# Patient Record
Sex: Female | Born: 1940 | Race: Black or African American | Hispanic: No | Smoking: Former smoker
Health system: Southern US, Community
[De-identification: ages and names within clinical notes are randomized; demographics above are authoritative.]

## PROBLEM LIST (undated history)

## (undated) DIAGNOSIS — I10 Essential (primary) hypertension: Secondary | ICD-10-CM

## (undated) DIAGNOSIS — Z9889 Other specified postprocedural states: Secondary | ICD-10-CM

## (undated) DIAGNOSIS — F419 Anxiety disorder, unspecified: Secondary | ICD-10-CM

## (undated) DIAGNOSIS — C519 Malignant neoplasm of vulva, unspecified: Secondary | ICD-10-CM

## (undated) DIAGNOSIS — IMO0002 Reserved for concepts with insufficient information to code with codable children: Secondary | ICD-10-CM

## (undated) DIAGNOSIS — Z923 Personal history of irradiation: Secondary | ICD-10-CM

## (undated) DIAGNOSIS — M1990 Unspecified osteoarthritis, unspecified site: Secondary | ICD-10-CM

## (undated) DIAGNOSIS — I272 Pulmonary hypertension, unspecified: Secondary | ICD-10-CM

## (undated) DIAGNOSIS — M545 Low back pain, unspecified: Secondary | ICD-10-CM

## (undated) DIAGNOSIS — I4891 Unspecified atrial fibrillation: Secondary | ICD-10-CM

## (undated) DIAGNOSIS — F32A Depression, unspecified: Secondary | ICD-10-CM

## (undated) DIAGNOSIS — Z8679 Personal history of other diseases of the circulatory system: Secondary | ICD-10-CM

## (undated) DIAGNOSIS — E785 Hyperlipidemia, unspecified: Secondary | ICD-10-CM

## (undated) DIAGNOSIS — I5032 Chronic diastolic (congestive) heart failure: Secondary | ICD-10-CM

## (undated) DIAGNOSIS — I340 Nonrheumatic mitral (valve) insufficiency: Secondary | ICD-10-CM

## (undated) DIAGNOSIS — I2510 Atherosclerotic heart disease of native coronary artery without angina pectoris: Secondary | ICD-10-CM

## (undated) DIAGNOSIS — M75100 Unspecified rotator cuff tear or rupture of unspecified shoulder, not specified as traumatic: Secondary | ICD-10-CM

## (undated) DIAGNOSIS — M4300 Spondylolysis, site unspecified: Secondary | ICD-10-CM

## (undated) DIAGNOSIS — I071 Rheumatic tricuspid insufficiency: Secondary | ICD-10-CM

## (undated) DIAGNOSIS — E119 Type 2 diabetes mellitus without complications: Secondary | ICD-10-CM

## (undated) DIAGNOSIS — M797 Fibromyalgia: Secondary | ICD-10-CM

## (undated) DIAGNOSIS — C50919 Malignant neoplasm of unspecified site of unspecified female breast: Secondary | ICD-10-CM

## (undated) DIAGNOSIS — Z9221 Personal history of antineoplastic chemotherapy: Secondary | ICD-10-CM

## (undated) DIAGNOSIS — I4811 Longstanding persistent atrial fibrillation: Secondary | ICD-10-CM

## (undated) DIAGNOSIS — F329 Major depressive disorder, single episode, unspecified: Secondary | ICD-10-CM

## (undated) DIAGNOSIS — I499 Cardiac arrhythmia, unspecified: Secondary | ICD-10-CM

## (undated) DIAGNOSIS — D509 Iron deficiency anemia, unspecified: Secondary | ICD-10-CM

## (undated) DIAGNOSIS — M48061 Spinal stenosis, lumbar region without neurogenic claudication: Secondary | ICD-10-CM

## (undated) HISTORY — DX: Malignant neoplasm of unspecified site of unspecified female breast: C50.919

## (undated) HISTORY — DX: Iron deficiency anemia, unspecified: D50.9

## (undated) HISTORY — DX: Chronic diastolic (congestive) heart failure: I50.32

## (undated) HISTORY — DX: Major depressive disorder, single episode, unspecified: F32.9

## (undated) HISTORY — DX: Low back pain: M54.5

## (undated) HISTORY — DX: Unspecified atrial fibrillation: I48.91

## (undated) HISTORY — DX: Reserved for concepts with insufficient information to code with codable children: IMO0002

## (undated) HISTORY — PX: TUBAL LIGATION: SHX77

## (undated) HISTORY — DX: Spinal stenosis, lumbar region without neurogenic claudication: M48.061

## (undated) HISTORY — PX: ABDOMINAL HYSTERECTOMY: SHX81

## (undated) HISTORY — DX: Fibromyalgia: M79.7

## (undated) HISTORY — PX: DILATION AND CURETTAGE OF UTERUS: SHX78

## (undated) HISTORY — DX: Low back pain, unspecified: M54.50

## (undated) HISTORY — DX: Depression, unspecified: F32.A

## (undated) HISTORY — DX: Unspecified osteoarthritis, unspecified site: M19.90

## (undated) HISTORY — PX: TONSILLECTOMY: SUR1361

## (undated) HISTORY — PX: VULVECTOMY PARTIAL: SHX6187

## (undated) HISTORY — DX: Malignant neoplasm of vulva, unspecified: C51.9

## (undated) HISTORY — PX: COLONOSCOPY: SHX174

## (undated) HISTORY — DX: Unspecified rotator cuff tear or rupture of unspecified shoulder, not specified as traumatic: M75.100

## (undated) HISTORY — DX: Spondylolysis, site unspecified: M43.00

## (undated) HISTORY — DX: Type 2 diabetes mellitus without complications: E11.9

---

## 2006-01-27 ENCOUNTER — Emergency Department (HOSPITAL_COMMUNITY): Admission: EM | Admit: 2006-01-27 | Discharge: 2006-01-28 | Payer: Self-pay | Admitting: Emergency Medicine

## 2006-05-04 ENCOUNTER — Ambulatory Visit: Payer: Self-pay | Admitting: Internal Medicine

## 2006-05-23 ENCOUNTER — Ambulatory Visit: Payer: Self-pay | Admitting: *Deleted

## 2006-06-05 ENCOUNTER — Ambulatory Visit: Payer: Self-pay | Admitting: Internal Medicine

## 2006-06-12 ENCOUNTER — Encounter: Admission: RE | Admit: 2006-06-12 | Discharge: 2006-08-17 | Payer: Self-pay | Admitting: Internal Medicine

## 2006-07-06 ENCOUNTER — Ambulatory Visit: Payer: Self-pay | Admitting: Internal Medicine

## 2006-08-01 ENCOUNTER — Ambulatory Visit (HOSPITAL_COMMUNITY): Admission: RE | Admit: 2006-08-01 | Discharge: 2006-08-01 | Payer: Self-pay | Admitting: Internal Medicine

## 2006-08-07 ENCOUNTER — Encounter: Admission: RE | Admit: 2006-08-07 | Discharge: 2006-08-07 | Payer: Self-pay | Admitting: Family Medicine

## 2006-08-30 ENCOUNTER — Encounter: Admission: RE | Admit: 2006-08-30 | Discharge: 2006-08-30 | Payer: Self-pay | Admitting: *Deleted

## 2006-11-19 ENCOUNTER — Ambulatory Visit: Payer: Self-pay | Admitting: Internal Medicine

## 2007-02-07 ENCOUNTER — Encounter: Admission: RE | Admit: 2007-02-07 | Discharge: 2007-02-07 | Payer: Self-pay | Admitting: *Deleted

## 2007-02-13 ENCOUNTER — Encounter (INDEPENDENT_AMBULATORY_CARE_PROVIDER_SITE_OTHER): Payer: Self-pay | Admitting: *Deleted

## 2007-02-27 DIAGNOSIS — F329 Major depressive disorder, single episode, unspecified: Secondary | ICD-10-CM | POA: Insufficient documentation

## 2007-02-27 DIAGNOSIS — R011 Cardiac murmur, unspecified: Secondary | ICD-10-CM | POA: Insufficient documentation

## 2007-03-20 ENCOUNTER — Ambulatory Visit: Payer: Self-pay | Admitting: Nurse Practitioner

## 2007-03-20 DIAGNOSIS — I499 Cardiac arrhythmia, unspecified: Secondary | ICD-10-CM | POA: Insufficient documentation

## 2007-03-20 DIAGNOSIS — J209 Acute bronchitis, unspecified: Secondary | ICD-10-CM | POA: Insufficient documentation

## 2007-03-20 DIAGNOSIS — I10 Essential (primary) hypertension: Secondary | ICD-10-CM | POA: Insufficient documentation

## 2007-04-10 ENCOUNTER — Encounter (INDEPENDENT_AMBULATORY_CARE_PROVIDER_SITE_OTHER): Payer: Self-pay | Admitting: Internal Medicine

## 2007-08-06 ENCOUNTER — Encounter: Admission: RE | Admit: 2007-08-06 | Discharge: 2007-08-06 | Payer: Self-pay | Admitting: Internal Medicine

## 2007-08-06 ENCOUNTER — Encounter (INDEPENDENT_AMBULATORY_CARE_PROVIDER_SITE_OTHER): Payer: Self-pay | Admitting: Internal Medicine

## 2007-08-09 ENCOUNTER — Ambulatory Visit: Payer: Self-pay | Admitting: Internal Medicine

## 2007-08-09 DIAGNOSIS — M5030 Other cervical disc degeneration, unspecified cervical region: Secondary | ICD-10-CM | POA: Insufficient documentation

## 2007-08-09 DIAGNOSIS — M5137 Other intervertebral disc degeneration, lumbosacral region: Secondary | ICD-10-CM | POA: Insufficient documentation

## 2007-08-09 DIAGNOSIS — M25519 Pain in unspecified shoulder: Secondary | ICD-10-CM | POA: Insufficient documentation

## 2007-08-12 ENCOUNTER — Ambulatory Visit (HOSPITAL_COMMUNITY): Admission: RE | Admit: 2007-08-12 | Discharge: 2007-08-12 | Payer: Self-pay | Admitting: Internal Medicine

## 2007-08-29 ENCOUNTER — Encounter (INDEPENDENT_AMBULATORY_CARE_PROVIDER_SITE_OTHER): Payer: Self-pay | Admitting: Internal Medicine

## 2007-09-10 ENCOUNTER — Ambulatory Visit: Payer: Self-pay | Admitting: Internal Medicine

## 2007-09-24 ENCOUNTER — Ambulatory Visit: Payer: Self-pay | Admitting: Internal Medicine

## 2007-09-26 ENCOUNTER — Encounter: Admission: RE | Admit: 2007-09-26 | Discharge: 2007-11-01 | Payer: Self-pay | Admitting: Internal Medicine

## 2007-09-26 ENCOUNTER — Encounter (INDEPENDENT_AMBULATORY_CARE_PROVIDER_SITE_OTHER): Payer: Self-pay | Admitting: Internal Medicine

## 2007-09-26 ENCOUNTER — Encounter (INDEPENDENT_AMBULATORY_CARE_PROVIDER_SITE_OTHER): Payer: Self-pay | Admitting: Nurse Practitioner

## 2007-10-18 ENCOUNTER — Telehealth (INDEPENDENT_AMBULATORY_CARE_PROVIDER_SITE_OTHER): Payer: Self-pay | Admitting: Internal Medicine

## 2007-11-01 ENCOUNTER — Encounter (INDEPENDENT_AMBULATORY_CARE_PROVIDER_SITE_OTHER): Payer: Self-pay | Admitting: Internal Medicine

## 2007-11-12 ENCOUNTER — Ambulatory Visit: Payer: Self-pay | Admitting: Internal Medicine

## 2007-11-12 DIAGNOSIS — R0989 Other specified symptoms and signs involving the circulatory and respiratory systems: Secondary | ICD-10-CM | POA: Insufficient documentation

## 2007-11-12 LAB — CONVERTED CEMR LAB
Chlamydia, DNA Probe: NEGATIVE
GC Probe Amp, Genital: NEGATIVE

## 2007-11-15 LAB — CONVERTED CEMR LAB
ALT: 23 units/L (ref 0–35)
AST: 18 units/L (ref 0–37)
Albumin: 4.3 g/dL (ref 3.5–5.2)
Alkaline Phosphatase: 77 units/L (ref 39–117)
BUN: 13 mg/dL (ref 6–23)
Basophils Absolute: 0 10*3/uL (ref 0.0–0.1)
Basophils Relative: 1 % (ref 0–1)
CO2: 24 meq/L (ref 19–32)
Calcium: 9.3 mg/dL (ref 8.4–10.5)
Chloride: 106 meq/L (ref 96–112)
Cholesterol: 182 mg/dL (ref 0–200)
Creatinine, Ser: 0.79 mg/dL (ref 0.40–1.20)
Eosinophils Absolute: 0.2 10*3/uL (ref 0.0–0.7)
Eosinophils Relative: 3 % (ref 0–5)
Glucose, Bld: 106 mg/dL — ABNORMAL HIGH (ref 70–99)
HCT: 39.5 % (ref 36.0–46.0)
HDL: 61 mg/dL (ref >39)
Hemoglobin: 12.5 g/dL (ref 12.0–15.0)
LDL Cholesterol: 98 mg/dL (ref 0–99)
Lymphocytes Relative: 48 % — ABNORMAL HIGH (ref 12–46)
Lymphs Abs: 2.7 10*3/uL (ref 0.7–4.0)
MCHC: 31.6 g/dL (ref 30.0–36.0)
MCV: 86.4 fL (ref 78.0–100.0)
Monocytes Absolute: 0.6 10*3/uL (ref 0.1–1.0)
Monocytes Relative: 10 % (ref 3–12)
Neutro Abs: 2.2 10*3/uL (ref 1.7–7.7)
Neutrophils Relative %: 38 % — ABNORMAL LOW (ref 43–77)
Platelets: 223 10*3/uL (ref 150–400)
Potassium: 4.9 meq/L (ref 3.5–5.3)
RBC: 4.57 M/uL (ref 3.87–5.11)
RDW: 15.8 % — ABNORMAL HIGH (ref 11.5–15.5)
Sodium: 141 meq/L (ref 135–145)
Total Bilirubin: 0.4 mg/dL (ref 0.3–1.2)
Total CHOL/HDL Ratio: 3
Total Protein: 7.5 g/dL (ref 6.0–8.3)
Triglycerides: 115 mg/dL (ref <150)
VLDL: 23 mg/dL (ref 0–40)
WBC: 5.7 10*3/uL (ref 4.0–10.5)

## 2007-11-18 ENCOUNTER — Ambulatory Visit: Payer: Self-pay | Admitting: Internal Medicine

## 2007-11-18 ENCOUNTER — Encounter (INDEPENDENT_AMBULATORY_CARE_PROVIDER_SITE_OTHER): Payer: Self-pay | Admitting: *Deleted

## 2007-11-18 LAB — CONVERTED CEMR LAB
Blood Glucose, AC Bkfst: 114 mg/dL
Hgb A1c MFr Bld: 6.5 %

## 2007-11-19 ENCOUNTER — Ambulatory Visit (HOSPITAL_COMMUNITY): Admission: RE | Admit: 2007-11-19 | Discharge: 2007-11-19 | Payer: Self-pay | Admitting: Internal Medicine

## 2007-11-19 ENCOUNTER — Encounter (INDEPENDENT_AMBULATORY_CARE_PROVIDER_SITE_OTHER): Payer: Self-pay | Admitting: Internal Medicine

## 2007-11-19 ENCOUNTER — Ambulatory Visit: Payer: Self-pay | Admitting: Surgery

## 2007-11-22 ENCOUNTER — Ambulatory Visit (HOSPITAL_COMMUNITY): Admission: RE | Admit: 2007-11-22 | Discharge: 2007-11-22 | Payer: Self-pay | Admitting: Internal Medicine

## 2007-12-19 ENCOUNTER — Ambulatory Visit: Payer: Self-pay | Admitting: Internal Medicine

## 2007-12-19 LAB — CONVERTED CEMR LAB
Bilirubin Urine: NEGATIVE
Blood Glucose, Fingerstick: 103
Blood in Urine, dipstick: NEGATIVE
Glucose, Urine, Semiquant: NEGATIVE
Ketones, urine, test strip: NEGATIVE
Nitrite: NEGATIVE
OCCULT 1: NEGATIVE
OCCULT 2: NEGATIVE
OCCULT 3: NEGATIVE
Protein, U semiquant: NEGATIVE
Specific Gravity, Urine: 1.02
Urobilinogen, UA: 0.2
pH: 5

## 2008-01-07 ENCOUNTER — Ambulatory Visit: Payer: Self-pay | Admitting: Internal Medicine

## 2008-02-05 ENCOUNTER — Telehealth (INDEPENDENT_AMBULATORY_CARE_PROVIDER_SITE_OTHER): Payer: Self-pay | Admitting: Internal Medicine

## 2008-03-11 ENCOUNTER — Ambulatory Visit: Payer: Self-pay | Admitting: Internal Medicine

## 2008-03-17 ENCOUNTER — Telehealth (INDEPENDENT_AMBULATORY_CARE_PROVIDER_SITE_OTHER): Payer: Self-pay | Admitting: Internal Medicine

## 2008-05-05 ENCOUNTER — Ambulatory Visit: Payer: Self-pay | Admitting: Internal Medicine

## 2008-05-05 DIAGNOSIS — N760 Acute vaginitis: Secondary | ICD-10-CM | POA: Insufficient documentation

## 2008-05-05 DIAGNOSIS — J309 Allergic rhinitis, unspecified: Secondary | ICD-10-CM | POA: Insufficient documentation

## 2008-05-05 LAB — CONVERTED CEMR LAB
Blood Glucose, AC Bkfst: 107 mg/dL
Hgb A1c MFr Bld: 6.2 %
KOH Prep: NONE SEEN
Whiff Test: NEGATIVE

## 2008-05-12 ENCOUNTER — Telehealth (INDEPENDENT_AMBULATORY_CARE_PROVIDER_SITE_OTHER): Payer: Self-pay | Admitting: Internal Medicine

## 2008-05-19 ENCOUNTER — Ambulatory Visit: Payer: Self-pay | Admitting: Internal Medicine

## 2008-06-01 ENCOUNTER — Telehealth (INDEPENDENT_AMBULATORY_CARE_PROVIDER_SITE_OTHER): Payer: Self-pay | Admitting: Internal Medicine

## 2008-06-02 ENCOUNTER — Ambulatory Visit: Payer: Self-pay | Admitting: Internal Medicine

## 2008-06-02 LAB — CONVERTED CEMR LAB
Chlamydia, DNA Probe: NEGATIVE
GC Probe Amp, Genital: NEGATIVE
Whiff Test: NEGATIVE

## 2008-07-16 ENCOUNTER — Encounter (INDEPENDENT_AMBULATORY_CARE_PROVIDER_SITE_OTHER): Payer: Self-pay | Admitting: Internal Medicine

## 2008-08-19 ENCOUNTER — Ambulatory Visit (HOSPITAL_COMMUNITY): Admission: RE | Admit: 2008-08-19 | Discharge: 2008-08-19 | Payer: Self-pay | Admitting: Internal Medicine

## 2008-09-01 ENCOUNTER — Ambulatory Visit: Payer: Self-pay | Admitting: Internal Medicine

## 2008-09-01 DIAGNOSIS — R0789 Other chest pain: Secondary | ICD-10-CM | POA: Insufficient documentation

## 2008-09-01 LAB — CONVERTED CEMR LAB: Hgb A1c MFr Bld: 6.5 %

## 2008-09-03 ENCOUNTER — Encounter (INDEPENDENT_AMBULATORY_CARE_PROVIDER_SITE_OTHER): Payer: Self-pay | Admitting: Internal Medicine

## 2008-09-16 ENCOUNTER — Telehealth (INDEPENDENT_AMBULATORY_CARE_PROVIDER_SITE_OTHER): Payer: Self-pay | Admitting: Internal Medicine

## 2008-10-05 ENCOUNTER — Ambulatory Visit: Payer: Self-pay | Admitting: Internal Medicine

## 2008-10-05 ENCOUNTER — Telehealth (INDEPENDENT_AMBULATORY_CARE_PROVIDER_SITE_OTHER): Payer: Self-pay | Admitting: Internal Medicine

## 2008-10-05 DIAGNOSIS — T783XXA Angioneurotic edema, initial encounter: Secondary | ICD-10-CM | POA: Insufficient documentation

## 2008-11-03 ENCOUNTER — Encounter (INDEPENDENT_AMBULATORY_CARE_PROVIDER_SITE_OTHER): Payer: Self-pay | Admitting: Internal Medicine

## 2008-11-12 ENCOUNTER — Ambulatory Visit: Payer: Self-pay | Admitting: Nurse Practitioner

## 2008-11-12 DIAGNOSIS — N644 Mastodynia: Secondary | ICD-10-CM | POA: Insufficient documentation

## 2008-11-26 ENCOUNTER — Ambulatory Visit: Payer: Self-pay | Admitting: Physician Assistant

## 2008-11-26 DIAGNOSIS — N390 Urinary tract infection, site not specified: Secondary | ICD-10-CM | POA: Insufficient documentation

## 2008-11-26 LAB — CONVERTED CEMR LAB
Glucose, Urine, Semiquant: NEGATIVE
Ketones, urine, test strip: NEGATIVE
Nitrite: NEGATIVE
Protein, U semiquant: 30
Specific Gravity, Urine: 1.025
Urobilinogen, UA: 0.2
pH: 5.5

## 2008-12-09 ENCOUNTER — Encounter (INDEPENDENT_AMBULATORY_CARE_PROVIDER_SITE_OTHER): Payer: Self-pay | Admitting: Internal Medicine

## 2008-12-09 ENCOUNTER — Ambulatory Visit (HOSPITAL_COMMUNITY): Admission: RE | Admit: 2008-12-09 | Discharge: 2008-12-09 | Payer: Self-pay | Admitting: Physician Assistant

## 2009-01-01 ENCOUNTER — Ambulatory Visit: Payer: Self-pay | Admitting: Internal Medicine

## 2009-01-01 DIAGNOSIS — E119 Type 2 diabetes mellitus without complications: Secondary | ICD-10-CM | POA: Insufficient documentation

## 2009-01-01 LAB — CONVERTED CEMR LAB
Blood Glucose, AC Bkfst: 101 mg/dL
Hgb A1c MFr Bld: 6.6 %

## 2009-01-04 ENCOUNTER — Encounter (HOSPITAL_COMMUNITY): Admission: RE | Admit: 2009-01-04 | Discharge: 2009-03-02 | Payer: Self-pay | Admitting: Cardiology

## 2009-02-02 ENCOUNTER — Ambulatory Visit: Payer: Self-pay | Admitting: Internal Medicine

## 2009-04-02 ENCOUNTER — Ambulatory Visit: Payer: Self-pay | Admitting: Internal Medicine

## 2009-04-02 DIAGNOSIS — J019 Acute sinusitis, unspecified: Secondary | ICD-10-CM | POA: Insufficient documentation

## 2009-04-02 LAB — CONVERTED CEMR LAB: Blood Glucose, Fingerstick: 180

## 2009-05-06 ENCOUNTER — Ambulatory Visit: Payer: Self-pay | Admitting: Internal Medicine

## 2009-06-23 ENCOUNTER — Ambulatory Visit: Payer: Self-pay | Admitting: Internal Medicine

## 2009-07-26 DIAGNOSIS — D649 Anemia, unspecified: Secondary | ICD-10-CM | POA: Insufficient documentation

## 2009-07-26 LAB — CONVERTED CEMR LAB
ALT: 10 units/L (ref 0–35)
AST: 12 units/L (ref 0–37)
Albumin: 4.1 g/dL (ref 3.5–5.2)
Alkaline Phosphatase: 57 units/L (ref 39–117)
BUN: 32 mg/dL — ABNORMAL HIGH (ref 6–23)
Basophils Absolute: 0.1 10*3/uL (ref 0.0–0.1)
Basophils Relative: 1 % (ref 0–1)
CO2: 24 meq/L (ref 19–32)
Calcium: 9.2 mg/dL (ref 8.4–10.5)
Chloride: 108 meq/L (ref 96–112)
Cholesterol: 183 mg/dL (ref 0–200)
Creatinine, Ser: 1.43 mg/dL — ABNORMAL HIGH (ref 0.40–1.20)
Eosinophils Absolute: 0.2 10*3/uL (ref 0.0–0.7)
Eosinophils Relative: 2 % (ref 0–5)
Glucose, Bld: 89 mg/dL (ref 70–99)
HCT: 34.4 % — ABNORMAL LOW (ref 36.0–46.0)
HDL: 45 mg/dL (ref >39)
Hemoglobin: 11 g/dL — ABNORMAL LOW (ref 12.0–15.0)
LDL Cholesterol: 105 mg/dL — ABNORMAL HIGH (ref 0–99)
Lymphocytes Relative: 45 % (ref 12–46)
Lymphs Abs: 3.5 10*3/uL (ref 0.7–4.0)
MCHC: 32 g/dL (ref 30.0–36.0)
MCV: 83.9 fL (ref 78.0–100.0)
Microalb, Ur: 0.58 mg/dL (ref 0.00–1.89)
Monocytes Absolute: 0.6 10*3/uL (ref 0.1–1.0)
Monocytes Relative: 8 % (ref 3–12)
Neutro Abs: 3.4 10*3/uL (ref 1.7–7.7)
Neutrophils Relative %: 44 % (ref 43–77)
Platelets: 236 10*3/uL (ref 150–400)
Potassium: 4.3 meq/L (ref 3.5–5.3)
RBC: 4.1 M/uL (ref 3.87–5.11)
RDW: 15 % (ref 11.5–15.5)
Sodium: 144 meq/L (ref 135–145)
Total Bilirubin: 0.3 mg/dL (ref 0.3–1.2)
Total CHOL/HDL Ratio: 4.1
Total Protein: 7.1 g/dL (ref 6.0–8.3)
Triglycerides: 167 mg/dL — ABNORMAL HIGH (ref <150)
VLDL: 33 mg/dL (ref 0–40)
WBC: 7.8 10*3/uL (ref 4.0–10.5)

## 2009-07-30 ENCOUNTER — Ambulatory Visit: Payer: Self-pay | Admitting: Internal Medicine

## 2009-07-30 DIAGNOSIS — L659 Nonscarring hair loss, unspecified: Secondary | ICD-10-CM | POA: Insufficient documentation

## 2009-07-30 DIAGNOSIS — E785 Hyperlipidemia, unspecified: Secondary | ICD-10-CM | POA: Insufficient documentation

## 2009-07-30 LAB — CONVERTED CEMR LAB
BUN: 24 mg/dL — ABNORMAL HIGH (ref 6–23)
Bilirubin Urine: NEGATIVE
Blood Glucose, Fingerstick: 100
CO2: 23 meq/L (ref 19–32)
Calcium: 9.2 mg/dL (ref 8.4–10.5)
Chloride: 105 meq/L (ref 96–112)
Creatinine, Ser: 1.23 mg/dL — ABNORMAL HIGH (ref 0.40–1.20)
Glucose, Bld: 97 mg/dL (ref 70–99)
Glucose, Urine, Semiquant: NEGATIVE
Hgb A1c MFr Bld: 6.1 %
Iron: 69 ug/dL (ref 42–145)
Ketones, urine, test strip: NEGATIVE
Nitrite: NEGATIVE
Potassium: 4.4 meq/L (ref 3.5–5.3)
Protein, U semiquant: NEGATIVE
RBC Folate: 449 ng/mL (ref 180–600)
Saturation Ratios: 18 % — ABNORMAL LOW (ref 20–55)
Sodium: 141 meq/L (ref 135–145)
Specific Gravity, Urine: <1.005
TIBC: 387 ug/dL (ref 250–470)
TSH: 2.395 microintl units/mL (ref 0.350–4.500)
UIBC: 318 ug/dL
Urobilinogen, UA: 0.2
Vitamin B-12: 455 pg/mL (ref 211–911)
pH: 5

## 2009-07-31 ENCOUNTER — Encounter (INDEPENDENT_AMBULATORY_CARE_PROVIDER_SITE_OTHER): Payer: Self-pay | Admitting: Internal Medicine

## 2009-08-10 ENCOUNTER — Ambulatory Visit: Payer: Self-pay | Admitting: Internal Medicine

## 2009-08-10 LAB — CONVERTED CEMR LAB
BUN: 23 mg/dL (ref 6–23)
CO2: 25 meq/L (ref 19–32)
Calcium: 9 mg/dL (ref 8.4–10.5)
Chloride: 105 meq/L (ref 96–112)
Creatinine, Ser: 1.1 mg/dL (ref 0.40–1.20)
Glucose, Bld: 104 mg/dL — ABNORMAL HIGH (ref 70–99)
Potassium: 3.7 meq/L (ref 3.5–5.3)
Sodium: 139 meq/L (ref 135–145)

## 2009-08-12 ENCOUNTER — Ambulatory Visit: Payer: Self-pay | Admitting: Internal Medicine

## 2009-08-13 ENCOUNTER — Encounter (INDEPENDENT_AMBULATORY_CARE_PROVIDER_SITE_OTHER): Payer: Self-pay | Admitting: Internal Medicine

## 2009-08-14 ENCOUNTER — Encounter (INDEPENDENT_AMBULATORY_CARE_PROVIDER_SITE_OTHER): Payer: Self-pay | Admitting: Internal Medicine

## 2009-08-14 ENCOUNTER — Telehealth (INDEPENDENT_AMBULATORY_CARE_PROVIDER_SITE_OTHER): Payer: Self-pay | Admitting: *Deleted

## 2009-08-14 LAB — CONVERTED CEMR LAB
Collection Interval-CRCL: 24 hr
Creatinine 24 HR UR: 1727 mg/24hr (ref 700–1800)
Creatinine Clearance: 109 mL/min (ref 75–115)
Creatinine, Urine: 69.1 mg/dL
Protein, Ur: 25 mg/24hr — ABNORMAL LOW (ref 50–100)

## 2009-08-18 ENCOUNTER — Ambulatory Visit: Payer: Self-pay | Admitting: Internal Medicine

## 2009-08-18 LAB — CONVERTED CEMR LAB
Bilirubin Urine: NEGATIVE
Glucose, Urine, Semiquant: NEGATIVE
Ketones, urine, test strip: NEGATIVE
Nitrite: NEGATIVE
Protein, U semiquant: NEGATIVE
Specific Gravity, Urine: <1.005
Urobilinogen, UA: 0.2
pH: 5

## 2009-08-20 ENCOUNTER — Ambulatory Visit (HOSPITAL_COMMUNITY): Admission: RE | Admit: 2009-08-20 | Discharge: 2009-08-20 | Payer: Self-pay | Admitting: Internal Medicine

## 2009-09-03 ENCOUNTER — Encounter (INDEPENDENT_AMBULATORY_CARE_PROVIDER_SITE_OTHER): Payer: Self-pay | Admitting: Internal Medicine

## 2009-09-05 ENCOUNTER — Encounter (INDEPENDENT_AMBULATORY_CARE_PROVIDER_SITE_OTHER): Payer: Self-pay | Admitting: Internal Medicine

## 2009-09-05 ENCOUNTER — Telehealth (INDEPENDENT_AMBULATORY_CARE_PROVIDER_SITE_OTHER): Payer: Self-pay | Admitting: Internal Medicine

## 2009-09-21 ENCOUNTER — Encounter (INDEPENDENT_AMBULATORY_CARE_PROVIDER_SITE_OTHER): Payer: Self-pay | Admitting: Internal Medicine

## 2009-09-27 ENCOUNTER — Encounter (INDEPENDENT_AMBULATORY_CARE_PROVIDER_SITE_OTHER): Payer: Self-pay | Admitting: Internal Medicine

## 2009-09-29 ENCOUNTER — Encounter (INDEPENDENT_AMBULATORY_CARE_PROVIDER_SITE_OTHER): Payer: Self-pay | Admitting: Internal Medicine

## 2009-11-01 ENCOUNTER — Ambulatory Visit: Payer: Self-pay | Admitting: Internal Medicine

## 2009-11-01 LAB — CONVERTED CEMR LAB
Cholesterol: 165 mg/dL (ref 0–200)
HDL: 47 mg/dL (ref >39)
LDL Cholesterol: 89 mg/dL (ref 0–99)
Total CHOL/HDL Ratio: 3.5
Triglycerides: 144 mg/dL (ref <150)
VLDL: 29 mg/dL (ref 0–40)

## 2009-11-04 ENCOUNTER — Ambulatory Visit: Payer: Self-pay | Admitting: Internal Medicine

## 2009-11-04 DIAGNOSIS — F17200 Nicotine dependence, unspecified, uncomplicated: Secondary | ICD-10-CM | POA: Insufficient documentation

## 2009-11-04 DIAGNOSIS — Z780 Asymptomatic menopausal state: Secondary | ICD-10-CM | POA: Insufficient documentation

## 2009-11-04 LAB — CONVERTED CEMR LAB
ALT: <8 units/L (ref 0–35)
AST: 11 units/L (ref 0–37)
Albumin: 4 g/dL (ref 3.5–5.2)
Alkaline Phosphatase: 67 units/L (ref 39–117)
BUN: 21 mg/dL (ref 6–23)
Basophils Absolute: 0.1 10*3/uL (ref 0.0–0.1)
Basophils Relative: 1 % (ref 0–1)
Bilirubin Urine: NEGATIVE
Blood Glucose, Fingerstick: 85
Blood in Urine, dipstick: NEGATIVE
CO2: 20 meq/L (ref 19–32)
Calcium: 9 mg/dL (ref 8.4–10.5)
Chloride: 104 meq/L (ref 96–112)
Creatinine, Ser: 1.16 mg/dL (ref 0.40–1.20)
Eosinophils Absolute: 0.2 10*3/uL (ref 0.0–0.7)
Eosinophils Relative: 2 % (ref 0–5)
Glucose, Bld: 96 mg/dL (ref 70–99)
Glucose, Urine, Semiquant: NEGATIVE
HCT: 34.7 % — ABNORMAL LOW (ref 36.0–46.0)
Hemoglobin: 11.2 g/dL — ABNORMAL LOW (ref 12.0–15.0)
Hgb A1c MFr Bld: 6 %
Ketones, urine, test strip: NEGATIVE
Lymphocytes Relative: 42 % (ref 12–46)
Lymphs Abs: 3.6 10*3/uL (ref 0.7–4.0)
MCHC: 32.3 g/dL (ref 30.0–36.0)
MCV: 85.9 fL (ref 78.0–100.0)
Microalb, Ur: 0.5 mg/dL (ref 0.00–1.89)
Monocytes Absolute: 0.6 10*3/uL (ref 0.1–1.0)
Monocytes Relative: 7 % (ref 3–12)
Neutro Abs: 4.1 10*3/uL (ref 1.7–7.7)
Neutrophils Relative %: 49 % (ref 43–77)
Nitrite: NEGATIVE
Platelets: 282 10*3/uL (ref 150–400)
Potassium: 3.9 meq/L (ref 3.5–5.3)
Protein, U semiquant: NEGATIVE
RBC: 4.04 M/uL (ref 3.87–5.11)
RDW: 15.5 % (ref 11.5–15.5)
Sodium: 138 meq/L (ref 135–145)
Specific Gravity, Urine: <1.005
Total Bilirubin: 0.3 mg/dL (ref 0.3–1.2)
Total Protein: 7 g/dL (ref 6.0–8.3)
Urobilinogen, UA: 0.2
WBC: 8.5 10*3/uL (ref 4.0–10.5)
pH: 5.5

## 2009-11-05 ENCOUNTER — Encounter (INDEPENDENT_AMBULATORY_CARE_PROVIDER_SITE_OTHER): Payer: Self-pay | Admitting: Internal Medicine

## 2009-11-08 ENCOUNTER — Encounter (INDEPENDENT_AMBULATORY_CARE_PROVIDER_SITE_OTHER): Payer: Self-pay | Admitting: Internal Medicine

## 2009-11-08 ENCOUNTER — Ambulatory Visit (HOSPITAL_COMMUNITY): Admission: RE | Admit: 2009-11-08 | Discharge: 2009-11-08 | Payer: Self-pay | Admitting: Internal Medicine

## 2009-11-08 LAB — CONVERTED CEMR LAB

## 2009-11-16 ENCOUNTER — Encounter (INDEPENDENT_AMBULATORY_CARE_PROVIDER_SITE_OTHER): Payer: Self-pay | Admitting: Internal Medicine

## 2009-11-17 ENCOUNTER — Encounter (INDEPENDENT_AMBULATORY_CARE_PROVIDER_SITE_OTHER): Payer: Self-pay | Admitting: Internal Medicine

## 2009-11-26 ENCOUNTER — Ambulatory Visit: Payer: Self-pay | Admitting: Internal Medicine

## 2009-12-03 ENCOUNTER — Ambulatory Visit: Payer: Self-pay | Admitting: Internal Medicine

## 2009-12-03 LAB — CONVERTED CEMR LAB: Blood Glucose, Fingerstick: 83

## 2009-12-10 ENCOUNTER — Ambulatory Visit: Payer: Self-pay | Admitting: Internal Medicine

## 2009-12-24 ENCOUNTER — Ambulatory Visit: Admission: RE | Admit: 2009-12-24 | Discharge: 2009-12-24 | Payer: Self-pay | Admitting: Gynecology

## 2010-01-07 ENCOUNTER — Inpatient Hospital Stay (HOSPITAL_BASED_OUTPATIENT_CLINIC_OR_DEPARTMENT_OTHER): Admission: RE | Admit: 2010-01-07 | Discharge: 2010-01-07 | Payer: Self-pay | Admitting: Cardiology

## 2010-01-11 ENCOUNTER — Ambulatory Visit (HOSPITAL_COMMUNITY): Admission: RE | Admit: 2010-01-11 | Discharge: 2010-01-11 | Payer: Self-pay | Admitting: Gynecology

## 2010-01-18 ENCOUNTER — Ambulatory Visit: Admission: RE | Admit: 2010-01-18 | Discharge: 2010-01-18 | Payer: Self-pay | Admitting: Gynecologic Oncology

## 2010-01-21 ENCOUNTER — Ambulatory Visit: Payer: Self-pay | Admitting: Internal Medicine

## 2010-01-27 HISTORY — PX: OTHER SURGICAL HISTORY: SHX169

## 2010-02-03 ENCOUNTER — Ambulatory Visit: Payer: Self-pay | Admitting: Internal Medicine

## 2010-02-08 ENCOUNTER — Encounter: Payer: Self-pay | Admitting: Obstetrics & Gynecology

## 2010-02-08 ENCOUNTER — Ambulatory Visit (HOSPITAL_COMMUNITY): Admission: RE | Admit: 2010-02-08 | Discharge: 2010-02-09 | Payer: Self-pay | Admitting: Obstetrics & Gynecology

## 2010-02-17 ENCOUNTER — Ambulatory Visit: Payer: Self-pay | Admitting: Internal Medicine

## 2010-03-02 ENCOUNTER — Ambulatory Visit: Admission: RE | Admit: 2010-03-02 | Discharge: 2010-03-02 | Payer: Self-pay | Admitting: Gynecologic Oncology

## 2010-03-07 ENCOUNTER — Ambulatory Visit: Payer: Self-pay | Admitting: Internal Medicine

## 2010-03-16 ENCOUNTER — Encounter (INDEPENDENT_AMBULATORY_CARE_PROVIDER_SITE_OTHER): Payer: Self-pay | Admitting: Internal Medicine

## 2010-03-21 ENCOUNTER — Ambulatory Visit: Payer: Self-pay | Admitting: Internal Medicine

## 2010-04-05 ENCOUNTER — Ambulatory Visit: Payer: Self-pay | Admitting: Internal Medicine

## 2010-04-05 LAB — CONVERTED CEMR LAB
Blood Glucose, Fingerstick: 84
Hgb A1c MFr Bld: 6 %

## 2010-04-11 ENCOUNTER — Telehealth (INDEPENDENT_AMBULATORY_CARE_PROVIDER_SITE_OTHER): Payer: Self-pay | Admitting: Internal Medicine

## 2010-04-19 ENCOUNTER — Encounter (INDEPENDENT_AMBULATORY_CARE_PROVIDER_SITE_OTHER): Payer: Self-pay | Admitting: Internal Medicine

## 2010-04-25 ENCOUNTER — Ambulatory Visit: Payer: Self-pay | Admitting: Internal Medicine

## 2010-05-26 ENCOUNTER — Ambulatory Visit
Admission: RE | Admit: 2010-05-26 | Discharge: 2010-05-26 | Payer: Self-pay | Source: Home / Self Care | Attending: Gynecologic Oncology | Admitting: Gynecologic Oncology

## 2010-06-03 ENCOUNTER — Ambulatory Visit
Admission: RE | Admit: 2010-06-03 | Discharge: 2010-06-03 | Payer: Self-pay | Source: Home / Self Care | Attending: Internal Medicine | Admitting: Internal Medicine

## 2010-06-28 NOTE — Assessment & Plan Note (Signed)
Summary: allergic reaction to shrimps//gk   Nurse Visit   Vital Signs:  Patient profile:   70 year old female Menstrual status:  hysterectomy Weight:      199 pounds BMI:     31.52 O2 Sat:      99 % Temp:     96.7 degrees F Pulse rate:   52 / minute Pulse rhythm:   regular Resp:     18 per minute BP sitting:   118 / 68  (left arm) Cuff size:   large  Vitals Entered By: Shellia Carwin CMA (Oct 05, 2008 11:55 AM)  CC: allergic reaction to some shrimp on Sat.  Has been taking Benadryl every six hours, but still has some swelling in her upper lip, no SOB.  She would like a steroid shot                                                                                                              Comments Pulse rechecked for one minute was 54.  Per Dr. Radene Ou continue Benadryl since not experiencing other symptoms......... Tiffany McCoy CMA  Oct 05, 2008 12:16 PM     Allergies: No Known Drug Allergies     Orders Added: 1)  Est. Patient Nurse visit [id number] 2)  Pulse Oximetry (single measurment) [id number]

## 2010-06-28 NOTE — Progress Notes (Signed)
   Phone Note From Pharmacy   [facility] Summary of Call: Request for refill of Toprol from [facility] pharmacy--the patient called back in September stating she was getting Medicare and wanted all her prescriptions changed to [facility] and they were.  Did she not get them filled? Initial call taken by: Mack Hook MD,  May 12, 2008 5:02 PM  Follow-up for Phone Call        Left message on answering machine for pt to return call  Follow-up by: Shellia Carwin CMA,  May 13, 2008 3:52 PM  Additional Follow-up for Phone Call Additional follow up Details #1::        Pt is using [facility] and has refills she is not sure why we got a request from [place].  Disregard it. Additional Follow-up by: Shellia Carwin CMA,  May 13, 2008 4:51 PM

## 2010-06-28 NOTE — Progress Notes (Signed)
Summary: NEEDING REFILLS   Phone Note Call from Patient Call back at Home Phone [phone]   Reason for Call: Refill Medication Summary of Call: mulberry pt. MS. Rembold WILL BE LEAVING ON NOV.[place] WILL BE BACK 12/01, BUT SHE WILL BE NEEDING A REFILL ON THE 2 PAIN MEDICATIONS BEFORE SHE LEAVES. SHE USES [facility] ON [address]. Initial call taken by: Roberto Scales,  March 17, 2008 11:24 AM  Follow-up for Phone Call        Pt requesting for Darvocet and Feldene prior to going out of town in November. Follow-up by: Shellia Carwin CMA,  March 17, 2008 12:06 PM  Additional Follow-up for Phone Call Additional follow up Details #1::        Pt aware she will check at [place].  All her rx's were transferred from [address] to [facility], so she will see if the Feldene was sent as well and let us know if any problem. Additional Follow-up by: Shellia Carwin CMA,  March 18, 2008 4:57 PM      Prescriptions: DARVOCET-N 100 100-650 MG  TABS (PROPOXYPHENE N-APAP) 1 tablet by mouth three times a day as needed for pain  #90 x 0   Entered and Authorized by:   Mack Hook MD   Signed by:   Mack Hook MD on 03/17/2008   Method used:   Printed then faxed to ...       [facility] (retail)       [address]       [place], [place]  [address]       Ph: [phone]       Fax: [phone]   RxID:   [id number]  Pt. should have refills through January on Feldene--she should check with pharmacy and let us know if that is not what they have on record.

## 2010-06-28 NOTE — Miscellaneous (Signed)
   Clinical Lists Changes  Observations: Added new observation of LDL: ; (11/08/2009 21:41) Added new observation of BONE DENSITY: AP spine:  T score 1.5;  right femoral neck T score:  -0.2;  Left femoral neck T score-0.5--normal (11/08/2009 21:41)

## 2010-06-28 NOTE — Assessment & Plan Note (Signed)
Summary: 1 MONTH FU///KT  Medications Added WELLBUTRIN XL 300 MG  TB24 (BUPROPION HCL) 1 cap by mouth daily      Allergies Added: NKDA  Vital Signs:  Patient Profile:   70 Years Old Female Height:     66.75 inches Weight:      195 pounds Temp:     97.8 degrees F oral Pulse rate:   76 / minute Pulse rhythm:   regular Resp:     20 per minute BP sitting:   150 / 76  (left arm) Cuff size:   regular  Pt. in pain?   yes    Location:   right leg    Intensity:   5    Type:       aching  Vitals Entered BySu Monks SMA (September 10, 2007 11:24 AM)              Is Patient Diabetic? No  Does patient need assistance? Functional Status Self care Ambulation Normal Comments pt has 1 refill left on Wellbutrin and Cyclobenzaprine      Chief Complaint:  pt here for CPP, pt had hysterectomy in 1988, had a ammogram in March, and pt also says she thinks she is suppose to get a steriod shot today as well.  History of Present Illness: 1.  Hypertension:  all of her antihypertensives make her sleepy.  She did not take any of her antihypertensives today.  No other blood pressure readings.  2.  Tobacco use:  stopped smoking 3/31.  Using Wellbutrin--doing fine with it.  Gaining weight.  Describes a diet high in desserts.  3.  Left shoulder:  still a problem.  Xray did not show significant narrowing.  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    Current Allergies: No known allergies       Physical Exam  Heart:     RRR.  Radial pulses normal and equal Msk:     Tender subdeltoid bursa on left.  Area cleaned in a sterile fashion.  1 cc 1% lidocaine injected Subcutaneously.  1cc lidocaine and 1cc 40 mg Depo medrol injected to subdeltoid area/shoulder joint with ease.  Pt. tolerated procedure well without complication.    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 1 tab by mouth q hs as needed pain    Darvocet-n 100 100-650 Mg Tabs (Propoxyphene n-apap) .[name] Kitchen... 1 tablet by mouth three times a day as needed for pain    Feldene 20 Mg Caps (Piroxicam) .[name] Kitchen... 1 tablet by mouth daily for neck/arm pain  Orders: Physical Therapy Referral (PT) Joint Aspirate / Injection, Large (20610) Depo- Medrol 40mg  (J1030)   Problem # 2:  DEGENERATIVE DISC DISEASE, LUMBOSACRAL SPINE (ICD-722.52) Referral to PT Orders: Physical Therapy Referral (PT)   Problem # 3:  TOBACCO ABUSE (ICD-305.1) Continue Wellbutrin  Problem # 4:  HYPERTENSION, BENIGN ESSENTIAL (ICD-401.1) To come in for nurse visit in 2 weeks for bp check--to take meds before. Her updated medication list for this problem includes:    Benazepril Hcl 40 Mg Tabs (Benazepril hcl) .[name] Kitchen... 1 tablet by mouth daily for blood pressure    Norvasc 10 Mg Tabs (Amlodipine besylate) .[name] Kitchen... 1 tablet by mouth daily for blood pressure    Toprol Xl 50 Mg Tb24 (Metoprolol succinate) .[name] Kitchen... 1 tab by mouth  daily   Complete Medication List: 1)  Cyclobenzaprine Hcl 10 Mg Tabs (Cyclobenzaprine hcl) .[name] Kitchen.. 1 tab by mouth q hs as needed pain 2)  Darvocet-n 100 100-650 Mg Tabs (Propoxyphene n-apap) .[name] Kitchen.. 1 tablet by mouth three times a day as needed for pain 3)  Feldene 20 Mg Caps (Piroxicam) .[name] Kitchen.. 1 tablet by mouth daily for neck/arm pain 4)  Benazepril Hcl 40 Mg Tabs (Benazepril hcl) .[name] Kitchen.. 1 tablet by mouth daily for blood pressure 5)  Norvasc 10 Mg Tabs (Amlodipine besylate) .[name] Kitchen.. 1 tablet by mouth daily for blood pressure 6)  Toprol Xl 50 Mg Tb24 (Metoprolol succinate) .[name] Kitchen.. 1 tab by mouth daily 7)  Wellbutrin Xl 300 Mg Tb24 (Bupropion hcl) .[name] Kitchen.. 1 cap by mouth daily   Patient Instructions: 1)  Nurse appt for bp check in next 2 weeks. 2)  Schedule CPE with Dr. Amil Amen in 2 months 3)  Call if you do not hear from PT in next week 4)  Be  seen immediately if develop redness, swelling, increased pain or fever from left shoulder.    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 Amil Amen [id number]  Orders Added: 1)  Est. Patient Level IV [id number] 2)  Physical Therapy Referral [PT] 3)  Joint Aspirate / Injection, Large [id number] 4)  Physical Therapy Referral [PT] 5)  Depo- Medrol 40mg  [id number]

## 2010-06-28 NOTE — Letter (Signed)
Summary: Handout Printed  Printed Handout:  - Diabetes: 1,500-Calorie Sample Menus and Portions

## 2010-06-28 NOTE — Letter (Signed)
Summary: FAXED REQUESTED RECORDS TO ANNA CALDWELL 08/29/07  FAXED REQUESTED RECORDS TO ANNA CALDWELL 08/29/07   Imported By: Adela Lank [place] 08/29/2007 14:49:22  _____________________________________________________________________  External Attachment:    Type:   Image     Comment:   External Document

## 2010-06-28 NOTE — Letter (Signed)
Summary: [facility]   Imported By: Roland Earl 10/19/2009 15:25:13  _____________________________________________________________________  External Attachment:    Type:   Image     Comment:   External Document

## 2010-06-28 NOTE — Letter (Signed)
Summary: APPLICATION FOR HANDICAP CARD  APPLICATION FOR HANDICAP CARD   Imported By: Roland Earl 04/10/2007 11:22:08  _____________________________________________________________________  External Attachment:    Type:   Image     Comment:   External Document

## 2010-06-28 NOTE — Progress Notes (Signed)
Summary: RAW AND VERY IRRITATED   Phone Note Call from Patient Call back at Home Phone [phone]   Summary of Call: MULBERRY PT. [place] SAYS THAT THE FLUCONAZOLE 150MG  DID NOT HELP AT ALL AND SHE IS REAL RAW. WOULD LIKE SOMETHING THAT IS GOING TO WORK, SHE IS VERY VERY IRRITATED. SHE USES [facility] ON [address]. Initial call taken by: Roberto Scales,  June 01, 2008 10:36 AM  Follow-up for Phone Call        Left message on answering machine for pt to return call at [phone] Follow-up by: Shellia Carwin CMA,  June 01, 2008 3:32 PM  Additional Follow-up for Phone Call Additional follow up Details #1::        Pt in office given open appt slot. Additional Follow-up by: Shellia Carwin CMA,  June 02, 2008 10:40 AM

## 2010-06-28 NOTE — Assessment & Plan Note (Signed)
Summary: CPE 2 MONTHS PER DR MULBERRY / NS   Vital Signs:  Patient Profile:   70 Years Old Female Height:     66.75 inches Weight:      208 pounds BMI:     32.94 Temp:     97.0 degrees F Pulse rate:   72 / minute Pulse rhythm:   regular Resp:     18 per minute BP sitting:   162 / 90  (left arm) Cuff size:   large  Pt. in pain?   yes    Location:   rt leg/back    Intensity:   5-6  Vitals Entered By: Shellia Carwin CMA (November 12, 2007 9:23 AM)                  Chief Complaint:  CPP and mammogram utd and per pt has had a total hyst..  History of Present Illness: 70 yo female here for CPP  Concerns: 1.  Weight gain:  Needs release for water exercise.  Working with Gretchen Portela through the [facility].  Cervical and Lumbar back problems. Right leg continues to bother her.  Would like to meet with nutritionist.    2.  Hypertension:  BP still up--discussed how weight may increase that as well.  Has been on Dyazide before and did well with that.  3.  External genitalia irritated.  Some odor at times.  Douches and washes regularly.  Using Premarin cream--sometimes twice a day--was prescribed last year and not even entered as a med in [place] Maintenance: 1.  Last Pap:  N/A-always normal prior to TAH 2.  SBE:  Occasionally--no change 3.  [facility]we do not [facility] report. 4.  Osteoprevention:  2-3 servings of cheese daily--no definite exercise currently--does play with grandchildren.  Does take Calcium 600 mg--no Vitamin D other than One a Day. 5.  Colonoscopy:  never 6.  Guaiac cards:  never 7.  Immunizations:  Last Td:  2008  Flu:  no  Pneumovax:  No    Current Allergies: No known allergies   Past Medical History:    Reviewed history and no changes required:       DEGENERATIVE DISC DISEASE, CERVICAL SPINE (ICD-722.4)       DEGENERATIVE DISC DISEASE, LUMBOSACRAL SPINE (ICD-722.52)       SHOULDER PAIN, LEFT, CHRONIC  (ICD-719.41)       IRREGULAR HEART RATE (ICD-427.9)       BRONCHITIS, ACUTE (ICD-466.0)       HYPERTENSION, BENIGN ESSENTIAL (ICD-401.1)       DEPRESSION (ICD-311)       MURMUR (ICD-785.2)         Past Surgical History:    Reviewed history and no changes required:       1.  1988:  TAH and BSO for fibroid tumors       2.  1974:  BTL       3.  Intermittent corticosteroid injections of low back.   Family History:    Mother, 38:  Dementia, Alzheimer's type, stroke    Father, died 37:  MI, alcoholism    4 brothers:  1 brother died age 93, MI.  Other 3 healthy    3 Sisters:  1 sister died age [id number] complications of Down Syndrome.  1 Sister with hypertension.      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    Lives alone.      Does have a female friend--sexually active occasionally.   Risk Factors:  Tobacco use:  quit    Year quit:  March of 2009--age 29    Pack-years:  Started age 34    Comments:  Smoked 1/2 ppd Passive smoke exposure:  no Drug use:  no Alcohol use:  yes    Type:  liquor--occasionally Exercise:  no Seatbelt use:  100 %   Review of Systems  General      Energy not good when hot outside.  Eyes      Glasses--needs new--wears bifocals.  ENT      Denies decreased hearing.  CV      Complains of palpitations.      Denies chest pain or discomfort.      occasional palpitations  Resp      Denies shortness of breath and wheezing.  GI      Denies abdominal pain, bloody stools, constipation, dark tarry stools, and diarrhea.  GU      See hpi  MS      Denies joint pain.  Derm      Denies lesion(s) and rash.  Neuro      Denies numbness.      Right lateral 3 toes--in PT for this currently--has had for months.  Psych      Denies depression.      Doing better now that has a place to live.   Physical  Exam  General:     obese, NAD Head:     Normocephalic and atraumatic without obvious abnormalities. No apparent alopecia or balding. Eyes:     No corneal or conjunctival inflammation noted. EOMI. Perrla. Funduscopic exam benign, without hemorrhages, exudates or papilledema. Vision grossly normal. Ears:     External ear exam shows no significant lesions or deformities.  Otoscopic examination reveals clear canals, tympanic membranes are intact bilaterally without bulging, retraction, inflammation or discharge. Hearing is grossly normal bilaterally. Nose:     External nasal examination shows no deformity or inflammation. Nasal mucosa are pink and moist without lesions or exudates. Mouth:     Palate with mucosal hypertrophy Neck:     No deformities, masses, or tenderness noted. Breasts:     No mass, nodules, thickening, tenderness, bulging, retraction, inflamation, nipple discharge or skin changes noted.   Lungs:     Normal respiratory effort, chest expands symmetrically. Lungs are clear to auscultation, no crackles or wheezes. Heart:     normal rate and regular rhythm. Grade II/VI SEM--radiates mildly to carotids.  Abdomen:     Bowel sounds positive,abdomen soft and non-tender without masses, organomegaly or hernias noted. Rectal:     No external abnormalities noted. Normal sphincter tone. No rectal masses or tenderness.  Heme negative light brown stool. Genitalia:     MIld inflammation of vaginal mucosa--not noted externally.  No discharge. Msk:     No deformity or scoliosis noted of thoracic or lumbar spine.   Pulses:     R and L carotid,radial,femoral,dorsalis pedis and posterior tibial pulses are full and equal bilaterally.  Question right carotid bruit. Extremities:     No clubbing, cyanosis, edema, or deformity noted with normal full range of motion of all joints.   Neurologic:     No cranial nerve deficits noted. Station and gait are normal. Plantar reflexes are down-going  bilaterally. DTRs are symmetrical throughout.  Unable to obtain ankle reflexes bilaterally. Sensory, motor and coordinative functions appear intact. Skin:     Intact without suspicious lesions or rashes Cervical Nodes:     No lymphadenopathy noted Axillary Nodes:     No palpable lymphadenopathy Inguinal Nodes:     No significant adenopathy Psych:     Cognition and judgment appear intact. Alert and cooperative with normal attention span and concentration. No apparent delusions, illusions, hallucinations    Impression & Recommendations:  Problem # 1:  HEALTH MAINTENANCE EXAM (ICD-V70.0) Regular exercise Calcium 600 mg two times a day with 400 International Units daily Encouraged flu shot yearly and Pneumovax in next year. Check to see if Mammogram report from 3/09 available.  Found--stable--to go yearly from now on. (calcifications) Hemoccult cards x3 to return in [redacted] weeks along with urine for UA Orders: [facility] [id number]) T-Comprehensive Metabolic Panel ([id number]) T-Lipid Profile [id number]) T-CBC w/Diff [id number])   Problem # 2:  HYPERTENSION, BENIGN ESSENTIAL (ICD-401.1) Not controlled--add Triamterene/HCTZ. Her updated medication list for this problem includes:    Benazepril Hcl 40 Mg Tabs (Benazepril hcl) .[name] Kitchen... 1 tablet by mouth daily for blood pressure    Norvasc 10 Mg Tabs (Amlodipine besylate) .[name] Kitchen... 1 tablet by mouth daily for blood pressure    Toprol Xl 50 Mg Tb24 (Metoprolol succinate) .[name] Kitchen... 1 tab by mouth daily    Triamterene-hctz 37.5-25 Mg Tabs (Triamterene-hctz) .[name] Kitchen... 1 tab by mouth q a.m.   Problem # 3:  DEGENERATIVE DISC DISEASE, LUMBOSACRAL SPINE (ICD-722.52) Radicular symptoms remain--check MRI Orders: Radiology Referral (Radiology)   Problem # 4:  CAROTID BRUIT, RIGHT (ICD-785.9) Possible--check carotid doppler Orders: Vascular Clinic (Vascular)   Complete Medication List: 1)  Cyclobenzaprine Hcl 10 Mg Tabs (Cyclobenzaprine hcl) .[name] Kitchen.. 1  tab by mouth q hs as needed pain 2)  Darvocet-n 100 100-650 Mg Tabs (Propoxyphene n-apap) .[name] Kitchen.. 1 tablet by mouth three times a day as needed for pain 3)  Feldene 20 Mg Caps (Piroxicam) .[name] Kitchen.. 1 tablet by mouth daily for neck/arm pain 4)  Benazepril Hcl 40 Mg Tabs (Benazepril hcl) .[name] Kitchen.. 1 tablet by mouth daily for blood pressure 5)  Norvasc 10 Mg Tabs (Amlodipine besylate) .[name] Kitchen.. 1 tablet by mouth daily for blood pressure 6)  Toprol Xl 50 Mg Tb24 (Metoprolol succinate) .[name] Kitchen.. 1 tab by mouth daily 7)  Wellbutrin Xl 300 Mg Tb24 (Bupropion hcl) .[name] Kitchen.. 1 cap by mouth daily 8)  Triamterene-hctz 37.5-25 Mg Tabs (Triamterene-hctz) .[name] Kitchen.. 1 tab by mouth q a.m.   Patient Instructions: 1)  Calcium 600 mg two times a day with at least 400 International Units of Vitamin D daily 2)  Regular exercise. 3)  Call for follow up with Dr. Amil Amen in 6 weeks.   Prescriptions: NORVASC 10 MG  TABS (AMLODIPINE BESYLATE) 1 tablet by mouth daily for blood pressure  #30 x 6   Entered and Authorized by:   Mack Hook MD   Signed by:   Mack Hook MD on 11/12/2007   Method used:   Print then Give to Patient   RxID:   [id number] FELDENE 20 MG  CAPS (PIROXICAM) 1 tablet by mouth daily for neck/arm pain  #30 x 6   Entered and Authorized by:   Mack Hook MD   Signed by:   Mack Hook MD on 11/12/2007   Method used:   Print then Give to Patient   RxID:   [id number] BENAZEPRIL HCL 40 MG  TABS (BENAZEPRIL HCL) 1 tablet by  mouth daily for blood pressure  #30 x 6   Entered and Authorized by:   Mack Hook MD   Signed by:   Mack Hook MD on 11/12/2007   Method used:   Print then Give to Patient   RxID:   [id number] TRIAMTERENE-HCTZ 37.5-25 MG  TABS (TRIAMTERENE-HCTZ) 1 tab by mouth q a.m.  #30 x 6   Entered and Authorized by:   Mack Hook MD   Signed by:   Mack Hook MD on 11/12/2007   Method used:   Print then Give to Patient   RxID:    [id number]  ]

## 2010-06-28 NOTE — Letter (Signed)
Summary: *Referral Letter  [facility]  [address] [place], [place] [address]   Phone: [phone]  Fax: [phone]    09/05/2009  Thank you in advance for agreeing to see my patient:  Diane [name] [address] [place], [place]  [address]  Phone: [phone]  Reason for Referral: Ms. Hasse has never had a screening colonoscopy and has mild anemia.  She has yet to return her guaiac card packet.  Iron studies are equivocal with %sat the only study a bit low.  Will include CBC and anemia studies with letter.  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.1) 10)  CHEST PAIN, ATYPICAL (ICD-786.59) 11)  ALLERGIC RHINITIS (ICD-477.9) 12)  VAGINITIS (ICD-616.10) 13)  NEED PROPHYLACTIC VACCINATION&INOCULATION FLU (ICD-V04.81) 14)  CAROTID BRUIT, RIGHT (ICD-785.9) 15)  ENCOUNTER FOR LONG-TERM USE OF OTHER MEDICATIONS (ICD-V58.69) 16)  HEALTH MAINTENANCE EXAM (ICD-V70.0) 17)  DEGENERATIVE DISC DISEASE, CERVICAL SPINE (ICD-722.4) 18)  DEGENERATIVE DISC DISEASE, LUMBOSACRAL SPINE (ICD-722.52) 19)  SHOULDER PAIN, LEFT, 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) 20)  IRREGULAR HEART RATE (ICD-427.9) 21)  BRONCHITIS, ACUTE (ICD-466.0) 22)  HYPERTENSION, BENIGN ESSENTIAL (ICD-401.1) 23)  DEPRESSION (ICD-311) 24)  MURMUR (ICD-785.2)   Current Medications: 1)  CYCLOBENZAPRINE HCL 10 MG  TABS (CYCLOBENZAPRINE HCL) 1 tab by mouth q hs as needed pain 2)  DARVOCET-N 100 100-650 MG  TABS (PROPOXYPHENE N-APAP) 1 tablet by mouth three times a day as needed for pain 3)  FELDENE 20 MG  CAPS (PIROXICAM) 1 tablet by mouth daily for neck/arm pain 4)  BENAZEPRIL HCL 40 MG   TABS (BENAZEPRIL HCL) 1 tablet by mouth daily for blood pressure 5)  NORVASC 10 MG  TABS (AMLODIPINE BESYLATE) 1 tablet by mouth daily for blood pressure 6)  TOPROL XL 50 MG  TB24 (METOPROLOL SUCCINATE) 1 tab by mouth daily 7)  WELLBUTRIN XL 300 MG  TB24 (BUPROPION HCL) 1 cap by mouth daily 8)  TRIAMTERENE-HCTZ 37.5-25 MG  TABS (TRIAMTERENE-HCTZ) 1 tab by mouth q a.m. 9)  FLUTICASONE PROPIONATE 50 MCG/ACT SUSP (FLUTICASONE PROPIONATE) 2 sprays each nostril daily 10)  FLUCONAZOLE 150 MG TABS (FLUCONAZOLE) 1 tab by mouth daily for 5 days. 11)  NITROGLYCERIN 0.4 MG SUBL (NITROGLYCERIN) dissolve one pill under tounge every 5 min as needed forl chest pain do not exceed 3 in 15 min 12)  METFORMIN HCL 500 MG TABS (METFORMIN HCL) 1 tab by mouth daily with breakfast for 1 week, then 1 tab by mouth two times a day with meals 13)  * GLUCOMETER test sugars once daily 14)  * GLUCOMETER TEST STRIPS test once daily 15)  LANCETS  MISC (LANCETS) once daily testing 16)  AZITHROMYCIN 250 MG TABS (AZITHROMYCIN) 2 tabs by mouth today, then 1 tab by mouth daily for 4 more days 17)  PROVENTIL HFA 108 (90 BASE) MCG/ACT AERS (ALBUTEROL SULFATE) 2 puffs every 4 hours as needed for chest tightness 18)  ASPIR-LOW 81 MG TBEC (ASPIRIN) 1 tab by mouth daily with meal 19)  MACROBID 100 MG CAPS (NITROFURANTOIN MONOHYD MACRO) 1 tab by mouth two times a day for 3 days 20)  FERROUS SULFATE 325 (65 FE) MG TABS (FERROUS SULFATE) 1 tab by mouth daily--otc   Past Medical History:  1)  DEGENERATIVE DISC DISEASE, CERVICAL SPINE (ICD-722.4) 2)  DEGENERATIVE DISC DISEASE, LUMBOSACRAL SPINE (ICD-722.52) 3)  SHOULDER PAIN, LEFT, 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  Sincerely,  Mack Hook MD

## 2010-06-28 NOTE — Assessment & Plan Note (Signed)
Summary: 4 MONTH FU////KT   Vital Signs:  Patient profile:   70 year old female Menstrual status:  hysterectomy Weight:      193 pounds Temp:     97.9 degrees F Pulse rate:   56 / minute Pulse rhythm:   regular Resp:     16 per minute BP sitting:   116 / 72  (left arm) Cuff size:   large  Vitals Entered By: Shellia Carwin CMA (January 01, 2009 8:29 AM) CC: 4 month f/u Is Patient Diabetic? No Pain Assessment Patient in pain? no       Does patient need assistance? Ambulation Normal   CC:  4 month f/u.  History of Present Illness: 1.  Atypical Chest Pain:  Has been to Cardiology--undergoing stress test next Monday.  Had echocardiogram last month--pt. has not received results from Cardiology as of yet.  Has NTG tabs to use as needed.  Has used once and seemed to help--chest pain occurred after eating and at rest.  Has been exercising and not having any pain during or after that.    Echo showed mild MR/AR, mild concentric hypertrophy and EF of 55-60 % without Regional wall motion abnormality.  2.  Hyperglycemia/DM:  Reportedly 101 per pt--not in chart yet.  Has been working on weight loss and exercise--aquatic therapy.  Some exercises at home.  Last A1C was 6.5%.  Today, A1C is 6.6%   3.  L/S DDD--had injection last month with Dr. Lacy Duverney office and helped significantly.   Current Medications (verified): 1)  Cyclobenzaprine Hcl 10 Mg  Tabs (Cyclobenzaprine Hcl) .[name] Kitchen.. 1 Tab By Mouth Q Hs As Needed Pain 2)  Darvocet-N 100 100-650 Mg  Tabs (Propoxyphene N-Apap) .[name] Kitchen.. 1 Tablet By Mouth Three Times A Day As Needed For Pain 3)  Feldene 20 Mg  Caps (Piroxicam) .[name] Kitchen.. 1 Tablet By Mouth Daily For Neck/arm Pain 4)  Benazepril Hcl 40 Mg  Tabs (Benazepril Hcl) .[name] Kitchen.. 1 Tablet By Mouth Daily For Blood Pressure 5)  Norvasc 10 Mg  Tabs (Amlodipine Besylate) .[name] Kitchen.. 1 Tablet By Mouth Daily For Blood Pressure 6)  Toprol Xl 50 Mg  Tb24 (Metoprolol Succinate) .[name] Kitchen.. 1 Tab By Mouth Daily 7)  Wellbutrin Xl  300 Mg  Tb24 (Bupropion Hcl) .[name] Kitchen.. 1 Cap By Mouth Daily 8)  Triamterene-Hctz 37.5-25 Mg  Tabs (Triamterene-Hctz) .[name] Kitchen.. 1 Tab By Mouth Q A.m. 9)  Fluticasone Propionate 50 Mcg/act Susp (Fluticasone Propionate) .... 2 Sprays Each Nostril Daily 10)  Fluconazole 150 Mg Tabs (Fluconazole) .[name] Kitchen.. 1 Tab By Mouth Daily For 5 Days. 11)  Nitroglycerin 0.4 Mg Subl (Nitroglycerin) .... Dissolve One Pill Under Tounge Every 5 Min As Needed Forl Chest Pain Do Not Exceed 3 in 15 Min  Allergies (verified): No Known Drug Allergies  Physical Exam  General:  NAD   Impression & Recommendations:  Problem # 1:  CHEST PAIN, ATYPICAL (ICD-786.59) Undergoing evaluation. Suspect GI in origin.  Problem # 2:  DIABETES MELLITUS, TYPE II (ICD-250.00) Start Metformin Glucometer Rx Her updated medication list for this problem includes:    Benazepril Hcl 40 Mg Tabs (Benazepril hcl) .[name] Kitchen... 1 tablet by mouth daily for blood pressure    Metformin Hcl 500 Mg Tabs (Metformin hcl) .[name] Kitchen... 1 tab by mouth daily with breakfast for 1 week, then 1 tab by mouth two times a day with meals  Complete Medication List: 1)  Cyclobenzaprine Hcl 10 Mg Tabs (Cyclobenzaprine hcl) .[name] Kitchen.. 1 tab by mouth q hs as needed pain 2)  Darvocet-n 100 100-650 Mg Tabs (Propoxyphene n-apap) .[name] Kitchen.. 1 tablet by mouth three times a day as needed for pain 3)  Feldene 20 Mg Caps (Piroxicam) .[name] Kitchen.. 1 tablet by mouth daily for neck/arm pain 4)  Benazepril Hcl 40 Mg Tabs (Benazepril hcl) .[name] Kitchen.. 1 tablet by mouth daily for blood pressure 5)  Norvasc 10 Mg Tabs (Amlodipine besylate) .[name] Kitchen.. 1 tablet by mouth daily for blood pressure 6)  Toprol Xl 50 Mg Tb24 (Metoprolol succinate) .[name] Kitchen.. 1 tab by mouth daily 7)  Wellbutrin Xl 300 Mg Tb24 (Bupropion hcl) .[name] Kitchen.. 1 cap by mouth daily 8)  Triamterene-hctz 37.5-25 Mg Tabs (Triamterene-hctz) .[name] Kitchen.. 1 tab by mouth q a.m. 9)  Fluticasone Propionate 50 Mcg/act Susp (Fluticasone propionate) .... 2 sprays each nostril daily 10)  Fluconazole  150 Mg Tabs (Fluconazole) .[name] Kitchen.. 1 tab by mouth daily for 5 days. 11)  Nitroglycerin 0.4 Mg Subl (Nitroglycerin) .... Dissolve one pill under tounge every 5 min as needed forl chest pain do not exceed 3 in 15 min 12)  Metformin Hcl 500 Mg Tabs (Metformin hcl) .[name] Kitchen.. 1 tab by mouth daily with breakfast for 1 week, then 1 tab by mouth two times a day with meals 13)  Glucometer  .... Test sugars once daily 14)  Glucometer Test Strips  .... Test once daily 15)  Lancets Misc (Lancets) .... Once daily testing  Patient Instructions: 1)  APPt with Diabetic educator for use of glucometer. 2)  Follow up with Dr. Amil Amen in 3 months Prescriptions: LANCETS  MISC (LANCETS) once daily testing  #100 x 11   Entered and Authorized by:   Mack Hook MD   Signed by:   Mack Hook MD on 01/01/2009   Method used:   Faxed to ...       [facility] [phone]* (retail)       [address]       [place], [place]  [address]       Ph: [phone]       Fax: [phone]   RxID:   [id number] GLUCOMETER TEST STRIPS test once daily  #100 x 11   Entered and Authorized by:   Mack Hook MD   Signed by:   Mack Hook MD on 01/01/2009   Method used:   Faxed to ...       [facility] [phone]* (retail)       [place], [place]  [address]       Ph: [phone]       Fax: [phone]   RxID:   [id number] GLUCOMETER test sugars once daily  #1 x 0   Entered and Authorized by:   Mack Hook MD   Signed by:   Mack Hook MD on 01/01/2009   Method used:   Faxed to ...       [facility] [phone]* (retail)       [place], [place]  [address]       Ph: [phone]       Fax: [phone]   RxID:   [id number] METFORMIN HCL 500 MG TABS (METFORMIN HCL) 1 tab by mouth daily with breakfast for 1 week, then 1 tab by mouth two times a day with meals  #60 x 4   Entered and Authorized by:   Mack Hook MD   Signed by:    Mack Hook MD on 01/01/2009   Method used:  Electronically to        [facility] [phone]* (retail)       [address]       [place], [place]  [address]       Ph: [phone]       Fax: [phone]   RxID:   [id number]

## 2010-06-28 NOTE — Assessment & Plan Note (Signed)
Summary: Bronchitis   Vital Signs:  Patient Profile:   70 Years Old Female Weight:      184 pounds Pulse rate:   76 / minute Pulse rhythm:   regular Resp:     20 per minute BP sitting:   168 / 90  (left arm) Cuff size:   regular  Pt. in pain?   yes    Location:   left shoulder, legs    Intensity:   5  Vitals Entered By: Shellia Carwin CMA (March 20, 2007 10:38 AM)              Is Patient Diabetic? No     Chief Complaint:  cold x 1 month, needs refills of bp meds although she is not sure what she is taking, and difficulty moving shoulders.  History of Present Illness: Pt into the office with multiple complaints.  Arthriits - bothered in the spine, neck and all down the left side.  she states that she was denied to get medicaid.  She has not been to orthopedic due to her denial. She states that she is filing a complaint with DDS in [place].  She has gone to rehab at [facility] for rehab on her neck but she notes that there was nothing that could be done for the pain that radiates down her left side.  pt uses a cane and is requesting a handicap placcard.  HTN - pt is out of the blood pressure meds.  She is not sure of the names of the medications.  She notes that she needs refills.  Insomnia - pt states that it is getting increasingly harder to sleep.  She notes that her neighbors have lots of visitors at all times of the night.  Pt is planning to move into the [facility].  However pt will have to break the lease in her current apartment which was originally signed for 1 year.  She will have to call legal aide for some assistance.  URI - pt has had the cold for the past month.  She has not taken anything over the counter due to her htn meds.  she has been using cough drops, lemons and orange juice.  Tobacco use - currenlty 8-10 cigs per day. pt recognizes that she needs to quit.     Hypertension History:      She denies headache, chest pain, palpitations, and side  effects from treatment.  She notes no problems with any antihypertensive medication side effects.        Positive major cardiovascular risk factors include female age 67 years old or older, hypertension, and current tobacco user.         Risk Factors:  Tobacco use:  current    Cigarettes:  Yes -- 1/2 pack(s) per day    Counseled to quit/cut down tobacco use:  yes   Review of Systems  General      Denies chills, fatigue, fever, and loss of appetite.  ENT      Denies earache and sore throat.  CV      Denies chest pain or discomfort, fatigue, and palpitations.  Resp      Complains of cough.      Denies shortness of breath and wheezing.  GI      Denies abdominal pain, bloody stools, change in bowel habits, constipation, dark tarry stools, diarrhea, excessive appetite, gas, hemorrhoids, indigestion, loss of appetite, nausea, vomiting, vomiting blood, and yellowish skin color.  GU  Denies abnormal vaginal bleeding, decreased libido, discharge, dysuria, genital sores, hematuria, incontinence, nocturia, urinary frequency, and urinary hesitancy.  MS      aches in neck and left arm  Neuro      Complains of headaches.  Psych      lots of stressors  Endo      Denies cold intolerance, excessive hunger, excessive thirst, excessive urination, heat intolerance, polyuria, and weight change.   Physical Exam  General:     alert and well-developed.   Head:     normocephalic.   Eyes:     pupils round.  glasses Ears:     R ear normal, L ear normal, and no external deformities.   Neck:     decreased ROM.  rotation is limited Lungs:     few scattered rhochi some decrease in the bases Heart:     blowing systolic murmur irregular heart rate  Abdomen:     soft, non-tender, and normal bowel sounds.   Msk:     up to exam table without assist decreased active ROM in left upper ext.   tenderness with palpation of left trapezius muscle Pulses:     pedal  edema Extremities:     trace left pedal edema.   Neurologic:     alert & oriented X3.   Skin:     color normal.   Psych:     Oriented X3.       Impression & Recommendations:  Problem # 1:  HYPERTENSION, BENIGN ESSENTIAL (ICD-401.1) BP elevated today. pt needs to get her meds refilled. Her updated medication list for this problem includes:    Toprol Xl 25 Mg Tb24 (Metoprolol succinate) .[name] Kitchen... 1/2 tablet by mouth two times a day for blood pressure    Lotrel 10-40 Mg Caps (Amlodipine besy-benazepril hcl) .[name] Kitchen... 1 tablet by mouth daily for blood pressure   Problem # 2:  DEGENERATIVE DISC DISEASE (ICD-722.6) darvocet as needed  feldent daily for inflammation pt has taken therapy and is at this point at a stand still for her treatment.  she is unable to afford the co-pay to see ortho  Problem # 3:  BRONCHITIS, ACUTE (ICD-466.0)  Her updated medication list for this problem includes:    Zithromax 250 Mg Tabs (Azithromycin) .[name] Kitchen... 2 tablets on day one then one tablet by mouth daily stop tobacco use.   Complete Medication List: 1)  Toprol Xl 25 Mg Tb24 (Metoprolol succinate) .... 1/2 tablet by mouth two times a day for blood pressure 2)  Lotrel 10-40 Mg Caps (Amlodipine besy-benazepril hcl) .[name] Kitchen.. 1 tablet by mouth daily for blood pressure 3)  Cyclobenzaprine Hcl 10 Mg Tabs (Cyclobenzaprine hcl) 4)  Darvocet-n 100 100-650 Mg Tabs (Propoxyphene n-apap) .[name] Kitchen.. 1 tablet by mouth three times a day as needed for pain 5)  Feldene 20 Mg Caps (Piroxicam) .[name] Kitchen.. 1 tablet by mouth daily for neck/arm pain 6)  Zithromax 250 Mg Tabs (Azithromycin) .... 2 tablets on day one then one tablet by mouth daily  Hypertension Assessment/Plan:      The patient's hypertensive risk group is category B: At least one risk factor (excluding diabetes) with no target organ damage.  Today's blood pressure is 168/90.  Her blood pressure goal is < 140/90.   Patient Instructions: 1)  May take plain Robitussin for  cough 2)  Start antibiotics 3)  Stop smoking    Prescriptions: ZITHROMAX 250 MG  TABS (AZITHROMYCIN) 2 tablets on day one then one tablet by mouth daily  #  6 x 0   Entered and Authorized by:   Aurora Mask FNP   Signed by:   Aurora Mask FNP on 03/20/2007   Method used:   Print then Give to Patient   RxID:   [id number] FELDENE 20 MG  CAPS (PIROXICAM) 1 tablet by mouth daily for neck/arm pain  #30 x 3   Entered and Authorized by:   Aurora Mask FNP   Signed by:   Aurora Mask FNP on 03/20/2007   Method used:   Print then Give to Patient   RxID:   [id number] LOTREL 10-40 MG  CAPS (AMLODIPINE BESY-BENAZEPRIL HCL) 1 tablet by mouth daily for blood pressure  #30 x 3   Entered and Authorized by:   Aurora Mask FNP   Signed by:   Aurora Mask FNP on 03/20/2007   Method used:   Print then Give to Patient   RxID:   [id number] TOPROL XL 25 MG  TB24 (METOPROLOL SUCCINATE) 1/2 tablet by mouth two times a day for blood pressure  #30 x 3   Entered and Authorized by:   Aurora Mask FNP   Signed by:   Aurora Mask FNP on 03/20/2007   Method used:   Print then Give to Patient   RxID:   [id number] DARVOCET-N 100 100-650 MG  TABS (PROPOXYPHENE N-APAP) 1 tablet by mouth three times a day as needed for pain  #90 x 0   Entered and Authorized by:   Aurora Mask FNP   Signed by:   Aurora Mask FNP on 03/20/2007   Method used:   Print then Give to Patient   [id number]  ]

## 2010-06-28 NOTE — Letter (Signed)
Summary: *[facility] Results Follow up  [place], [place] [address]   Phone: [phone]  Fax: [phone]      11/17/2009   [facility] [address] [place], [place]  [address]   Dear  Ms. [place],                            ____S.Drinkard,FNP   ____D. Gore,FNP       ____B. McPherson,MD   ____V. Rankins,MD    ___X_E. Mulberry,MD    ____N. Hassell Done, FNP  ____D. Jobe Igo, MD    ____K. Tomma Lightning, MD    ____Other     This letter is to inform you that your recent test(s):  _______Pap Smear    _______Lab Test     ___X____X-ray    __X_____ is within acceptable limits  _______ requires a medication change  _______ requires a follow-up lab visit  _______ requires a follow-up visit with your provider   Comments:  Bone density was normal       _________________________________________________________ If you have any questions, please contact our office                     Sincerely,  Mack Hook MD [facility]

## 2010-06-28 NOTE — Letter (Signed)
Summary: *Referral Letter  [facility]  [address] [place], [place] [address]   Phone: [phone]  Fax: [phone]    08/14/2009  Thank you in advance for agreeing to see my patient:  Diane [name] [address] [place], [place]  [address]  Phone: [phone]  Reason for Referral: Vaginal irritation sometimes associated with discharge and odor dating back to 2009.  Minimal findings on exam initially.  On wet prep, has had BV and yeast intermittently.  Treated in past with Metronidazole, Fluconazole, and antifungal topicals as well.  Seemed to respond to treatment at times, but with recurrence.  On exam beginning of March, Pt. complained of more discomfort with new finding of significant inflammation and ulceration at left vaginal introitus with couple of white raised "warty" lesions within the inflammed area.  Concern for Leukoplakia/malignancy or possibly lichen planus.    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) 10)  CHEST PAIN, ATYPICAL (ICD-786.59) 11)  ALLERGIC RHINITIS (ICD-477.9) 12)  VAGINITIS (ICD-616.10) 13)  NEED PROPHYLACTIC VACCINATION&INOCULATION FLU (ICD-V04.81) 14)  CAROTID BRUIT, RIGHT (ICD-785.9) 15)  ENCOUNTER FOR LONG-TERM USE OF OTHER MEDICATIONS (ICD-V58.69) 16)  HEALTH MAINTENANCE EXAM (ICD-V70.0) 17)  DEGENERATIVE DISC DISEASE, CERVICAL SPINE (ICD-722.4) 18)  DEGENERATIVE DISC DISEASE, LUMBOSACRAL SPINE (ICD-722.52) 19)  SHOULDER PAIN, LEFT, 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) 20)  IRREGULAR HEART RATE (ICD-427.9) 21)  BRONCHITIS, ACUTE (ICD-466.0) 22)  HYPERTENSION, BENIGN  ESSENTIAL (ICD-401.1) 23)  DEPRESSION (ICD-311) 24)  MURMUR (ICD-785.2)   Current Medications: 1)  CYCLOBENZAPRINE HCL 10 MG  TABS (CYCLOBENZAPRINE HCL) 1 tab by mouth q hs as needed pain 2)  DARVOCET-N 100 100-650 MG  TABS (PROPOXYPHENE N-APAP) 1 tablet by mouth three times a day as needed for pain 3)  FELDENE 20 MG  CAPS (PIROXICAM) 1 tablet by mouth daily for neck/arm pain 4)  BENAZEPRIL HCL 40 MG  TABS (BENAZEPRIL HCL) 1 tablet by mouth daily for blood pressure 5)  NORVASC 10 MG  TABS (AMLODIPINE BESYLATE) 1 tablet by mouth daily for blood pressure 6)  TOPROL XL 50 MG  TB24 (METOPROLOL SUCCINATE) 1 tab by mouth daily 7)  WELLBUTRIN XL 300 MG  TB24 (BUPROPION HCL) 1 cap by mouth daily 8)  TRIAMTERENE-HCTZ 37.5-25 MG  TABS (TRIAMTERENE-HCTZ) 1 tab by mouth q a.m. 9)  FLUTICASONE PROPIONATE 50 MCG/ACT SUSP (FLUTICASONE PROPIONATE) 2 sprays each nostril daily 10)  FLUCONAZOLE 150 MG TABS (FLUCONAZOLE) 1 tab by mouth daily for 5 days. 11)  NITROGLYCERIN 0.4 MG SUBL (NITROGLYCERIN) dissolve one pill under tounge every 5 min as needed forl chest pain do not exceed 3 in 15 min 12)  METFORMIN HCL 500 MG TABS (METFORMIN HCL) 1 tab by mouth daily with breakfast for 1 week, then 1 tab by mouth two times a day with meals 13)  * GLUCOMETER test sugars once daily 14)  * GLUCOMETER TEST STRIPS test once daily 15)  LANCETS  MISC (LANCETS) once daily testing 16)  AZITHROMYCIN 250 MG TABS (AZITHROMYCIN) 2 tabs by mouth today, then 1 tab by mouth daily for 4 more days 17)  PROVENTIL HFA 108 (90 BASE) MCG/ACT AERS (ALBUTEROL SULFATE) 2 puffs every 4 hours as needed for chest tightness 18)  ASPIR-LOW 81 MG TBEC (ASPIRIN) 1 tab by mouth daily with meal 19)  MACROBID 100 MG CAPS (NITROFURANTOIN MONOHYD MACRO) 1 tab by mouth two times a day for 3 days   Past Medical History: 1)  DEGENERATIVE DISC DISEASE, CERVICAL SPINE (ICD-722.4) 2)  DEGENERATIVE DISC DISEASE, LUMBOSACRAL SPINE (ICD-722.52) 3)  SHOULDER  PAIN, LEFT, 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) 4)  IRREGULAR HEART RATE (ICD-427.9) 5)  BRONCHITIS, ACUTE (ICD-466.0) 6)  HYPERTENSION, BENIGN ESSENTIAL (ICD-401.1) 7)  DEPRESSION (ICD-311) 8)  MURMUR (ICD-785.2) 9)  Borderline Diabetes   Prior History of Blood Transfusions:   Pertinent Labs:    Thank you again for agreeing to see our patient; please contact us if you have any further questions or need additional information.  Sincerely,  Mack Hook MD

## 2010-06-28 NOTE — Assessment & Plan Note (Signed)
Summary: 4 MONTH FU///KT   Vital Signs:  Patient profile:   70 year old female Menstrual status:  hysterectomy Height:      66.75 inches Weight:      181 pounds BMI:     28.66 Temp:     97.6 degrees F oral Pulse rate:   54 / minute Pulse rhythm:   regular Resp:     18 per minute BP sitting:   141 / 73  (left arm) Cuff size:   large  Vitals Entered By: [place] Shannon (July 30, 2009 8:44 AM) CC: four month f/u.... pt has questions about her sugar levels and she says she still has the irration in her vagina.... Is Patient Diabetic? Yes CBG Result 100  Does patient need assistance? Functional Status Self care Ambulation Normal   CC:  four month f/u.... pt has questions about her sugar levels and she says she still has the irration in her vagina.....  History of Present Illness: 1.  Hair loss:  all over, but more so behind bang line--started after last seen.  Only started Metformin in recent months--August.  2.  DM:  Working on diet--has lost 9 lbs since last here.  Trying to eat more veggies.  Sugars running 115- 130  fasting, bedtime and about 1 hour postprandial.  A1C down to 6.1% currently.  Had Retasure in December.  3.  Hypertension:  did not take meds this morning.  4.  Elevated creatinine:  Was to come in nonfasting for labs, but was not notified to do so--had Tea only today.  5.  Hyperlipidemia:  Recently had done with Dr. Terrence Dupont as well.  Did not start on any meds.  Pt. has changed diet since last seen.  Planning to work out at [employer] as well.  Would like to continue working on life style changes before starting meds.  6.  Anemia:  No hematochezia or melena.  Occasional epigastric cramping after eating--lasts only a couple of minutes.  No history of hemorrhoids.  7.  Vaginal irritation:  Recurrent issue--on left--thought this was better with last visit..  Have not really seen anything on exam previously save for a bit of irritation--generally more internally and with a  discharge/atrophy.  Have tried multiple topicals, oral Fluconazole in past with what I thought was resolution--has shown clue cells and yeast in past Wet preps .  Seems to improve on own intermittently as well.Bonne Dolores Vagisil?  cannot remember exactly what she used--anti itch cream--did not help.  Just feels raw.  Using Vaseline.  Current Medications (verified): 1)  Cyclobenzaprine Hcl 10 Mg  Tabs (Cyclobenzaprine Hcl) .[name] Kitchen.. 1 Tab By Mouth Q Hs As Needed Pain 2)  Darvocet-N 100 100-650 Mg  Tabs (Propoxyphene N-Apap) .[name] Kitchen.. 1 Tablet By Mouth Three Times A Day As Needed For Pain 3)  Feldene 20 Mg  Caps (Piroxicam) .[name] Kitchen.. 1 Tablet By Mouth Daily For Neck/arm Pain 4)  Benazepril Hcl 40 Mg  Tabs (Benazepril Hcl) .[name] Kitchen.. 1 Tablet By Mouth Daily For Blood Pressure 5)  Norvasc 10 Mg  Tabs (Amlodipine Besylate) .[name] Kitchen.. 1 Tablet By Mouth Daily For Blood Pressure 6)  Toprol Xl 50 Mg  Tb24 (Metoprolol Succinate) .[name] Kitchen.. 1 Tab By Mouth Daily 7)  Wellbutrin Xl 300 Mg  Tb24 (Bupropion Hcl) .[name] Kitchen.. 1 Cap By Mouth Daily 8)  Triamterene-Hctz 37.5-25 Mg  Tabs (Triamterene-Hctz) .[name] Kitchen.. 1 Tab By Mouth Q A.m. 9)  Fluticasone Propionate 50 Mcg/act Susp (Fluticasone Propionate) .... 2 Sprays Each Nostril Daily 10)  Fluconazole 150 Mg Tabs (Fluconazole) .[name] Kitchen.. 1 Tab By Mouth Daily For 5 Days. 11)  Nitroglycerin 0.4 Mg Subl (Nitroglycerin) .... Dissolve One Pill Under Tounge Every 5 Min As Needed Forl Chest Pain Do Not Exceed 3 in 15 Min 12)  Metformin Hcl 500 Mg Tabs (Metformin Hcl) .[name] Kitchen.. 1 Tab By Mouth Daily With Breakfast For 1 Week, Then 1 Tab By Mouth Two Times A Day With Meals 13)  Glucometer .... Test Sugars Once Daily 14)  Glucometer Test Strips .... Test Once Daily 15)  Lancets  Misc (Lancets) .... Once Daily Testing 16)  Azithromycin 250 Mg Tabs (Azithromycin) .... 2 Tabs By Mouth Today, Then 1 Tab By Mouth Daily For 4 More Days 17)  Proventil Hfa 108 (90 Base) Mcg/act Aers (Albuterol Sulfate) .... 2 Puffs Every 4 Hours As Needed For  Chest Tightness  Allergies (verified): No Known Drug Allergies  Physical Exam  Head:  Unable to remove any hair with gentle pull.  No patches of hair loss noted Lungs:  Normal respiratory effort, chest expands symmetrically. Lungs are clear to auscultation, no crackles or wheezes. Heart:  Normal rate and regular rhythm. S1 and S2 normal without gallop, murmur, click, rub or other extra sounds. Abdomen:  Bowel sounds positive,abdomen soft and non-tender without masses, organomegaly or hernias noted. Genitalia:  Obvious inflammation, ulceration today at left vaginal vestibule, inner aspect of labia minora.  Has two raised white almost warty appearing lesion within the inflammed area.  Tender in this area.  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 Orders: Gynecologic Referral (Gyn)  Problem # 2:  DYSLIPIDEMIA (ICD-272.4) Pt. would like to work on lifestyle changes before starting meds. LDL goal less than 70  Problem # 3:  RENAL INSUFFICIENCY (ICD-588.9) Recheck labs Orders: T-Basic Metabolic Panel ([id number])  Problem # 4:  HAIR LOSS (ICD-704.00) Appears if anything to be a global thinning of hair Orders: T-TSH [id number])  Problem # 5:  ANEMIA (0000000)  Orders: T-Folic Acid; RBC ([id number]) T-Vitamin B12 [id number]) Alric Quan [id number]) T-Iron Binding Capacity (TIBC) ([id number])  Problem # 6:  DIABETES MELLITUS, TYPE II (ICD-250.00) controlled Her updated medication list for this problem includes:    Benazepril Hcl 40 Mg Tabs (Benazepril hcl) .[name] Kitchen... 1 tablet by mouth daily for blood pressure    Metformin Hcl 500 Mg Tabs (Metformin hcl) .[name] Kitchen... 1 tab by mouth daily with breakfast for 1 week, then 1 tab by mouth two times a day with meals    Aspir-low 81 Mg Tbec (Aspirin)  .[name] Kitchen... 1 tab by mouth daily with meal  Orders: Hgb A1C [id number]) Capillary Blood Glucose/CBG [id number])  Problem # 7:  HYPERTENSION, BENIGN ESSENTIAL (ICD-401.1) A bit high today, but did not take meds. Her updated medication list for this problem includes:    Benazepril Hcl 40 Mg Tabs (Benazepril hcl) .[name] Kitchen... 1 tablet by mouth daily for blood pressure    Norvasc 10 Mg Tabs (Amlodipine besylate) .[name] Kitchen... 1 tablet by mouth daily for blood pressure    Toprol Xl 50 Mg Tb24 (Metoprolol succinate) .[name] Kitchen... 1 tab by mouth daily    Triamterene-hctz 37.5-25 Mg Tabs (Triamterene-hctz) .[name] Kitchen... 1 tab by mouth q a.m.  Complete Medication List: 1)  Cyclobenzaprine Hcl 10 Mg Tabs (Cyclobenzaprine hcl) .[name] Kitchen.. 1 tab by mouth q hs as needed pain 2)  Darvocet-n  100 100-650 Mg Tabs (Propoxyphene n-apap) .[name] Kitchen.. 1 tablet by mouth three times a day as needed for pain 3)  Feldene 20 Mg Caps (Piroxicam) .[name] Kitchen.. 1 tablet by mouth daily for neck/arm pain 4)  Benazepril Hcl 40 Mg Tabs (Benazepril hcl) .[name] Kitchen.. 1 tablet by mouth daily for blood pressure 5)  Norvasc 10 Mg Tabs (Amlodipine besylate) .[name] Kitchen.. 1 tablet by mouth daily for blood pressure 6)  Toprol Xl 50 Mg Tb24 (Metoprolol succinate) .[name] Kitchen.. 1 tab by mouth daily 7)  Wellbutrin Xl 300 Mg Tb24 (Bupropion hcl) .[name] Kitchen.. 1 cap by mouth daily 8)  Triamterene-hctz 37.5-25 Mg Tabs (Triamterene-hctz) .[name] Kitchen.. 1 tab by mouth q a.m. 9)  Fluticasone Propionate 50 Mcg/act Susp (Fluticasone propionate) .... 2 sprays each nostril daily 10)  Fluconazole 150 Mg Tabs (Fluconazole) .[name] Kitchen.. 1 tab by mouth daily for 5 days. 11)  Nitroglycerin 0.4 Mg Subl (Nitroglycerin) .... Dissolve one pill under tounge every 5 min as needed forl chest pain do not exceed 3 in 15 min 12)  Metformin Hcl 500 Mg Tabs (Metformin hcl) .[name] Kitchen.. 1 tab by mouth daily with breakfast for 1 week, then 1 tab by mouth two times a day with meals 13)  Glucometer  .... Test sugars once daily 14)  Glucometer Test Strips  .... Test once daily 15)   Lancets Misc (Lancets) .... Once daily testing 16)  Azithromycin 250 Mg Tabs (Azithromycin) .... 2 tabs by mouth today, then 1 tab by mouth daily for 4 more days 17)  Proventil Hfa 108 (90 Base) Mcg/act Aers (Albuterol sulfate) .... 2 puffs every 4 hours as needed for chest tightness 18)  Aspir-low 81 Mg Tbec (Aspirin) .[name] Kitchen.. 1 tab by mouth daily with meal 19)  Macrobid 100 Mg Caps (Nitrofurantoin monohyd macro) .[name] Kitchen.. 1 tab by mouth two times a day for 3 days  Patient Instructions: 1)  Lab appt. for FLP --fasting in 3-4 months 2)  CPP 2 days after  above labs drawn--Dr. Amil Amen  Laboratory Results   Urine Tests    Routine Urinalysis   Glucose: negative   (Normal Range: Negative) Bilirubin: negative   (Normal Range: Negative) Ketone: negative   (Normal Range: Negative) Spec. Gravity: <1.005   (Normal Range: 1.003-1.035) Blood: small   (Normal Range: Negative) pH: 5.0   (Normal Range: 5.0-8.0) Protein: negative   (Normal Range: Negative) Urobilinogen: 0.2   (Normal Range: 0-1) Nitrite: negative   (Normal Range: Negative) Leukocyte Esterace: moderate   (Normal Range: Negative)     Blood Tests   Date/Time Received: July 30, 2009 8:59 AM   HGBA1C: 6.1%   (Normal Range: Non-Diabetic - 3-6%   Control Diabetic - 6-8%) CBG Random:: 100mg /dL      Tetanus/Td Immunization History:    Tetanus/Td # 1:  Historical (06/05/2006)  Influenza Immunization History:    Influenza # 1:  Fluvax 3+ (04/02/2009)  Pneumovax Immunization History:    Pneumovax # 1:  Pneumovax (Medicare) (03/11/2008)

## 2010-06-28 NOTE — Assessment & Plan Note (Signed)
Summary: F/U BP CK 2 WEEKS PER DR MULBERRY / NS   Nurse Visit   Vital Signs:  Patient Profile:   70 Years Old Female Height:     66.75 inches Pulse rate:   60 / minute Pulse rhythm:   regular BP sitting:   152 / 74  (right arm) Cuff size:   regular  Vitals Entered By: Su Monks SMA (September 24, 2007 9:28 AM)                 Prior Medications: CYCLOBENZAPRINE HCL 10 MG  TABS (CYCLOBENZAPRINE HCL) 1 tab by mouth q hs as needed pain DARVOCET-N 100 100-650 MG  TABS (PROPOXYPHENE N-APAP) 1 tablet by mouth three times a day as needed for pain FELDENE 20 MG  CAPS (PIROXICAM) 1 tablet by mouth daily for neck/arm pain BENAZEPRIL HCL 40 MG  TABS (BENAZEPRIL HCL) 1 tablet by mouth daily for blood pressure NORVASC 10 MG  TABS (AMLODIPINE BESYLATE) 1 tablet by mouth daily for blood pressure TOPROL XL 50 MG  TB24 (METOPROLOL SUCCINATE) 1 tab by mouth daily WELLBUTRIN XL 300 MG  TB24 (BUPROPION HCL) 1 cap by mouth daily Current Allergies: No known allergies     Orders Added: 1)  Est. Patient Nurse visit [id number]    ]Pt. states she has been taking meds regularly and took them the morning of BP check as well.  She has a CPE scheduled for June.  To make sure she continues meds regularly and will see her then.  If still a bit high, will need to consider additional med or med change.

## 2010-06-28 NOTE — Letter (Signed)
Summary: NUTRITIONIST SUMMARY  NUTRITIONIST SUMMARY   Imported By: Roland Earl 03/24/2008 09:01:28  _____________________________________________________________________  External Attachment:    Type:   Image     Comment:   External Document

## 2010-06-28 NOTE — Assessment & Plan Note (Signed)
Summary: vaginal irritation and raw /tmm   Vital Signs:  Patient Profile:   70 Years Old Female Height:     66.75 inches Weight:      205 pounds BMI:     32.47 Temp:     95.9 degrees F Pulse rate:   64 / minute Pulse rhythm:   regular Resp:     18 per minute BP sitting:   120 / 82  (left arm) Cuff size:   large  Pt. in pain?   yes    Location:   vagina    Intensity:   10  Vitals Entered By: Shellia Carwin CMA (June 02, 2008 10:43 AM)                  Chief Complaint:  still feels very irritated and raw in her vagina does not feel that last med helped.[name] Kitchen  History of Present Illness: Vaginal irritation:  Still with a foul odor and discharge--gray.  Took Metronidazole as prescribed and then the Fluconazole.  Feels worse now.  Did, unfortunately, use her Premarin cream again, but has not noted any improvement or worsening.    Prior Medications Reviewed Using: Patient Recall  Current Allergies: No known allergies       Physical Exam  Genitalia:     External genitalia irritated.  Vaginal mucosa also inflammed with thin white discharge.  Unable to open the speculum secondary to discomfort.  No odor.  No lesions noted    Impression & Recommendations:  Problem # 1:  VAGINITIS (ICD-616.10) Fluconazole 150 mg daily for  5 days plus otc miconazole 7    Complete Medication List: 1)  Cyclobenzaprine Hcl 10 Mg Tabs (Cyclobenzaprine hcl) .[name] Kitchen.. 1 tab by mouth q hs as needed pain 2)  Darvocet-n 100 100-650 Mg Tabs (Propoxyphene n-apap) .[name] Kitchen.. 1 tablet by mouth three times a day as needed for pain 3)  Feldene 20 Mg Caps (Piroxicam) .[name] Kitchen.. 1 tablet by mouth daily for neck/arm pain 4)  Benazepril Hcl 40 Mg Tabs (Benazepril hcl) .[name] Kitchen.. 1 tablet by mouth daily for blood pressure 5)  Norvasc 10 Mg Tabs (Amlodipine besylate) .[name] Kitchen.. 1 tablet by mouth daily for blood pressure 6)  Toprol Xl 50 Mg Tb24 (Metoprolol succinate) .[name] Kitchen.. 1 tab by mouth daily 7)  Wellbutrin Xl 300 Mg Tb24  (Bupropion hcl) .[name] Kitchen.. 1 cap by mouth daily 8)  Triamterene-hctz 37.5-25 Mg Tabs (Triamterene-hctz) .[name] Kitchen.. 1 tab by mouth q a.m. 9)  Fluticasone Propionate 50 Mcg/act Susp (Fluticasone propionate) .... 2 sprays each nostril daily 10)  Fluconazole 150 Mg Tabs (Fluconazole) .[name] Kitchen.. 1 tab by mouth daily for 5 days.  Other Orders: T- GC Chlamydia [id number]) T-Pap Smear, Thin Prep [id number]) T-General Health Panel (CBCD, CMP, TSH) ([id number]) T-Lipid Profile [id number]) Pap Smear, Thin Prep ( Collection of) ([id number]) KOH/ WET Mount [id number]) UA Dipstick w/o Micro (manual) [id number])   Patient Instructions: 1)  Miconazole 7 day intravaginal yeast cream--use at bedtime for 7 nights  (also known brand name as Monistat 7) 2)  Other orders listed in body of note other than Wet mount/KOH were not done--inadvertently ordered in this chart.   Prescriptions: FLUCONAZOLE 150 MG TABS (FLUCONAZOLE) 1 tab by mouth daily for 5 days.  #5 x 0   Entered and Authorized by:   Mack Hook MD   Signed by:   Mack Hook MD on 06/02/2008   Method used:   Electronically to        [facility] [phone]* (  retail)       [address]       [place], [place]  [address]       Ph: [phone]       Fax: [phone]   RxID:   [id number]  ] Laboratory Results    Wet Mount/KOH Source: vaginal WBC/hpf: 5-10 Bacteria/hpf: 1+ Clue cells/hpf: none  Negative whiff Yeast/hpf: moderate Trichomonas/hpf: none   Appended Document: vaginal irritation and raw /tmm GC/chlamydia also performed this visit--clarification

## 2010-06-28 NOTE — Progress Notes (Signed)
   Phone Note Call from Patient   Summary of Call: PT NOW HAS MEDICARE AND WILL NEED A REFILL ON HER MEDICATION SHE IS GOING TO USE [facility] AND WILL BE OUT OF MEDS IN ABOUT 9 DAYS. SHE WILL NEED EVERYTHING CHANGED. Initial call taken by: Roberto Scales,  February 05, 2008 11:05 AM  Follow-up for Phone Call        PCP is [facility].Sent to wrong desktop Follow-up by: Milagros Evener MD,  February 05, 2008 1:15 PM      Prescriptions: TRIAMTERENE-HCTZ 37.5-25 MG  TABS (TRIAMTERENE-HCTZ) 1 tab by mouth q a.m.  #30 x 6   Entered and Authorized by:   Mack Hook MD   Signed by:   Mack Hook MD on 02/07/2008   Method used:   Electronically to        [facility]* (retail)       [address]       [place], [place]  [address]       Ph: [phone]       Fax: [phone]   RxID:   [id number] WELLBUTRIN XL 300 MG  TB24 (BUPROPION HCL) 1 cap by mouth daily  #30 x 6   Entered and Authorized by:   Mack Hook MD   Signed by:   Mack Hook MD on 02/07/2008   Method used:   Electronically to        [facility] (retail)       [address]       [place], [place]  [address]       Ph: [phone]       Fax: [phone]   RxID:   [id number] TOPROL XL 50 MG  TB24 (METOPROLOL SUCCINATE) 1 tab by mouth daily  #30 x 6   Entered and Authorized by:   Mack Hook MD   Signed by:   Mack Hook MD on 02/07/2008   Method used:   Electronically to        [facility] (retail)       [address]       [place], [place]  [address]       Ph: [phone]       Fax: [phone]   RxID:   [id number] NORVASC 10 MG  TABS (AMLODIPINE BESYLATE) 1 tablet by mouth daily for blood pressure  #30 x 6   Entered and Authorized by:   Mack Hook MD   Signed by:   Mack Hook MD on 02/07/2008   Method used:   Electronically to        [facility] (retail)       [address]       [place], [place]  [address]       Ph:  [phone]       Fax: [phone]   RxID:   [id number] BENAZEPRIL HCL 40 MG  TABS (BENAZEPRIL HCL) 1 tablet by mouth daily for blood pressure  #30 x 6   Entered and Authorized by:   Mack Hook MD   Signed by:   Mack Hook MD on 02/07/2008   Method used:   Electronically to        [facility]* (retail)       [address]       [place], [place]  [address]       Ph: [phone]       Fax: [phone]   RxID:   [id number]

## 2010-06-28 NOTE — Letter (Signed)
Summary: [facility]   Imported By: Roland Earl 05/02/2010 14:28:36  _____________________________________________________________________  External Attachment:    Type:   Image     Comment:   External Document

## 2010-06-28 NOTE — Letter (Signed)
Summary: SUSIE'SUMMARY  [facility]SUMMARY   Imported By: Roland Earl 06/09/2009 15:39:35  _____________________________________________________________________  External Attachment:    Type:   Image     Comment:   External Document

## 2010-06-28 NOTE — Assessment & Plan Note (Signed)
Summary: NEW DM PER NURSE  / NS   Vital Signs:  Patient Profile:   70 Years Old Female Height:     66.75 inches Weight:      208 pounds Temp:     97.0 degrees F Pulse rate:   60 / minute Pulse rhythm:   regular Resp:     18 per minute BP sitting:   124 / 80  (left arm) Cuff size:   large  Pt. in pain?   no  Vitals Entered By: Shellia Carwin CMA (December 19, 2007 9:27 AM)              Is Patient Diabetic? Yes  CBG Result 103  Does patient need assistance? Ambulation Normal     Chief Complaint:  new onset dm.  History of Present Illness: 1.  Hypertension:  Tolerating Triamterene/HCTZ fine.  2.  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 will have Medicare starting the beginning of September.  Previously received injections for the right radicular pain with some improvement.   3.  Hyperglycemia:  Mildly elevated fasting glucose with A1C of 6.5%:  Describes eating too much and likes sweetened fluids.  Has started walking this week.      Prior Medications Reviewed Using: Patient Recall  Current Allergies: No known allergies         Impression & Recommendations:  Problem # 1:  HYPERGLYCEMIA, FASTING (ICD-790.29) Referral to Diabetic education Long discussion regarding lifestyle changes and medications. Diabetic complications discussed. Follow up in 3 months  Problem # 2:  HYPERTENSION, BENIGN ESSENTIAL (ICD-401.1) controlled. Her updated medication list for this problem includes:    Benazepril Hcl 40 Mg Tabs (Benazepril hcl) .[name] Kitchen... 1 tablet by mouth daily for blood pressure    Norvasc 10 Mg Tabs (Amlodipine besylate) .[name] Kitchen... 1 tablet by mouth daily for blood pressure    Toprol Xl 50 Mg Tb24 (Metoprolol succinate) .[name] Kitchen... 1 tab by mouth daily    Triamterene-hctz 37.5-25 Mg Tabs (Triamterene-hctz) .[name] Kitchen... 1 tab by mouth q a.m.   Problem # 3:  DEGENERATIVE DISC DISEASE, LUMBOSACRAL SPINE (ICD-722.52) Pt. to call when Medicare starts up and will refer to  Neurosurgery--possibly more so for injections.  Complete Medication List: 1)  Cyclobenzaprine Hcl 10 Mg Tabs (Cyclobenzaprine hcl) .[name] Kitchen.. 1 tab by mouth q hs as needed pain 2)  Darvocet-n 100 100-650 Mg Tabs (Propoxyphene n-apap) .[name] Kitchen.. 1 tablet by mouth three times a day as needed for pain 3)  Feldene 20 Mg Caps (Piroxicam) .[name] Kitchen.. 1 tablet by mouth daily for neck/arm pain 4)  Benazepril Hcl 40 Mg Tabs (Benazepril hcl) .[name] Kitchen.. 1 tablet by mouth daily for blood pressure 5)  Norvasc 10 Mg Tabs (Amlodipine besylate) .[name] Kitchen.. 1 tablet by mouth daily for blood pressure 6)  Toprol Xl 50 Mg Tb24 (Metoprolol succinate) .[name] Kitchen.. 1 tab by mouth daily 7)  Wellbutrin Xl 300 Mg Tb24 (Bupropion hcl) .[name] Kitchen.. 1 cap by mouth daily 8)  Triamterene-hctz 37.5-25 Mg Tabs (Triamterene-hctz) .[name] Kitchen.. 1 tab by mouth q a.m.  Other Orders: Capillary Blood Glucose ([id number]) Fingerstick [id number])   Patient Instructions: 1)  Appt. with Diabetic Educator 2)  Call for follow up with Dr. Amil Amen in 3 months   ] Laboratory Results   Urine Tests    Routine Urinalysis   Glucose: negative   (Normal Range: Negative) Bilirubin: negative   (Normal Range: Negative) Ketone: negative   (Normal Range: Negative) Spec. Gravity: 1.020   (Normal Range: 1.003-1.035) Blood: negative   (Normal  Range: Negative) pH: 5.0   (Normal Range: 5.0-8.0) Protein: negative   (Normal Range: Negative) Urobilinogen: 0.2   (Normal Range: 0-1) Nitrite: negative   (Normal Range: Negative) Leukocyte Esterace: moderate   (Normal Range: Negative)     Blood Tests     CBG Random:: 103mg /dL    Stool - Occult Blood Hemmoccult #1: negative Date: 12/19/2007 Hemoccult #2: negative Date: 12/19/2007 Hemoccult #3: negative Date: 12/19/2007

## 2010-06-28 NOTE — Assessment & Plan Note (Signed)
Summary: 3 MONTH FU FROM 01/05//KT   Vital Signs:  Patient profile:   70 year old female Menstrual status:  hysterectomy Weight:      201 pounds Temp:     97.7 degrees F Pulse rate:   68 / minute Pulse rhythm:   regular Resp:     18 per minute BP sitting:   126 / 84  (left arm) Cuff size:   large  Vitals Entered By: Shellia Carwin CMA (September 01, 2008 9:04 AM) CC: 3 month f/u had vaginal irritation it is better now that she switched soaps Is Patient Diabetic? No Pain Assessment Patient in pain? no       Does patient need assistance? Ambulation Normal  years   days  Menstrual Status hysterectomy   History of Present Illness: 1.  Vaginal Irritation:  Completely resolved after switched from Safeguard to Zest.  2.  Lumbosacral DDD:  Did receive 2 epidural injections and did get some relief.  Seeing Dr. Loretha Stapler.  Plans for nerve conduction studies on all extremities.  States having numbness intermittently in all extrems at times.  Also, Dr. Loretha Stapler.  3.  Chest discomfort:  Sharp and throbbing in left chest.  First occurred last week--2 episodes. Had black eyed peas and cabbage a few hours before.  Tried ginger-ale, TUMS, peppermint until begain belching and then relieved the pain.  Episode lasted 10-15 minutes.  Second episode occurred after eating--was lying down.  Same pain until tried the otc remedies--began belching and felt better.  Started back smoking after mom's illness and death last month.  No alcohol.  Has been eating ice cream with chocolate syrup to top off.  No caffeine other than a cup of tea in the morning.  Had not exerted self before episodes--both at rest.  Never had this before.    4.  Hyperglycemia:   Is doing water aerobics for DDD and for general health.  Has been trying to work on diet as well.  Has lost 4 lbs since last here.  Is snacking on ice cream and cookies intermittently.  Allergies (verified): No Known Drug Allergies  Physical Exam  Chest Wall:   NT over anterior chest wall. Lungs:  Normal respiratory effort, chest expands symmetrically. Lungs are clear to auscultation, no crackles or wheezes. Heart:  Normal rate and regular rhythm. S1 and S2 normal without gallop, murmur, click, rub or other extra sounds.  Radial pulses normal and equal. Abdomen:  Bowel sounds positive,abdomen soft and non-tender without masses, organomegaly or hernias noted.   Impression & Recommendations:  Problem # 1:  CHEST PAIN, ATYPICAL (ICD-786.59) Sounds like dyspepsia--pt. did not want EKG today.   To call if recurs. Feel what she is doing to treat actually relieves very quickly, but can try Pepcid AC in future as well. Episodes have been very limited and do not feel she needs something regularly at this time.  Problem # 2:  HYPERGLYCEMIA, FASTING (ICD-790.29) Discussed increasing A1C. Pt. would like to  work on this for 4 more months and see if can get down with diet and exercise--if not, will start meds.  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 Christella Noa.  Complete Medication List: 1)  Cyclobenzaprine Hcl 10 Mg Tabs (Cyclobenzaprine hcl) .[name] Kitchen.. 1 tab by mouth q hs as needed pain 2)  Darvocet-n 100 100-650 Mg Tabs (Propoxyphene n-apap) .[name] Kitchen.. 1 tablet by mouth three times  a day as needed for pain 3)  Feldene 20 Mg Caps (Piroxicam) .[name] Kitchen.. 1 tablet by mouth daily for neck/arm pain 4)  Benazepril Hcl 40 Mg Tabs (Benazepril hcl) .[name] Kitchen.. 1 tablet by mouth daily for blood pressure 5)  Norvasc 10 Mg Tabs (Amlodipine besylate) .[name] Kitchen.. 1 tablet by mouth daily for blood pressure 6)  Toprol Xl 50 Mg Tb24 (Metoprolol succinate) .[name] Kitchen.. 1 tab by mouth daily 7)  Wellbutrin Xl 300 Mg Tb24 (Bupropion hcl) .[name] Kitchen.. 1 cap by mouth daily 8)  Triamterene-hctz 37.5-25 Mg Tabs (Triamterene-hctz) .[name] Kitchen.. 1 tab by mouth q a.m. 9)  Fluticasone Propionate 50 Mcg/act Susp (Fluticasone  propionate) .... 2 sprays each nostril daily 10)  Fluconazole 150 Mg Tabs (Fluconazole) .[name] Kitchen.. 1 tab by mouth daily for 5 days.  Patient Instructions: 1)  Follow up with Dr. Amil Amen for CPE in 4 months  Laboratory Results   Blood Tests     HGBA1C: 6.5%   (Normal Range: Non-Diabetic - 3-6%   Control Diabetic - 6-8%)

## 2010-06-28 NOTE — Progress Notes (Signed)
  Medications Added FERROUS SULFATE 325 (65 FE) MG TABS (FERROUS SULFATE) 1 tab by mouth daily--otc       Phone Note Outgoing Call   Call placed by: Mack Hook MD,  August 14, 2009 11:42 AM Summary of Call: Spoke with Ms. Robbins regarding her labs--renal function was fine when she hydrated herself.  Discussed that she needs to get her guaiac cards in--should also give a urine to recheck UA following UTI treatment when brings in stool cards.   Also sending for colonoscopy with her anemia, that appears to be at least partially due to iron deficiency. Tiffany--please make sure the repeat UA happens when she brings in stool cards.   Pt. also starting iron supplementation--otc Initial call taken by: Mack Hook MD,  August 14, 2009 11:45 AM  Follow-up for Phone Call        Debra--please set up for GI referral- will need to send iron studies, CBC, referral letter.  Pt. aware and order written as well. With regard to gynecology referral--if we cannot get her in soon, would like to go elsewhere--important for her to be seen soon--may need to see if can go to [facility] if there is much more of a delay.  Thanks. I did fax my completed note and referral note to [facility] Follow-up by: Mack Hook MD,  August 14, 2009 11:48 AM    New/Updated Medications: FERROUS SULFATE 325 (65 FE) MG TABS (FERROUS SULFATE) 1 tab by mouth daily--otc

## 2010-06-28 NOTE — Letter (Signed)
Summary: Handout Printed  Printed Handout:  - Diabetes Overview (Living with Diabetes)

## 2010-06-28 NOTE — Assessment & Plan Note (Signed)
Summary: 2209 PT/ CHEST PAIN//GK   Vital Signs:  Patient profile:   70 year old female Menstrual status:  hysterectomy Height:      66.75 inches Weight:      195.5 pounds Temp:     98.4 degrees F oral Pulse rate:   76 / minute Pulse rhythm:   regular Resp:     18 per minute BP sitting:   120 / 80  (left arm) Cuff size:   large  Vitals Entered By: [place] Shannon (November 26, 2008 11:32 AM)  CC:  pt says she has been hurting since tuesday.... but she thought it was gas and she tried tums but nothing helped......[name] Kitchen pt says her body is in pain.......  History of Present Illness: 70 year old female with a history of hypertension, borderline/diet-controlled diabetes, family history of coronary disease who is a smoker who presents for further evaluation of chest pain.  The pain started several days ago.  She says it is in the left side of her chest as well as in her left flank.  Upon further questioning, it sounds as though her pain is mainly in the left flank and seems to radiate up into her chest.  She has bilateral arm pain that is related to her fibromyalgia.  This has not changed.  She denies any shortness of breath associated with it.  She denies any associated nausea or diaphoresis.  She denies syncope.  She does note worsening symptoms with positional changes as well as deep breathing and lying down.  She denies any change with sitting up.  Most of her symptoms seem to be worse with positional changes.  She really denies orthopnea or PND.  She does sleep on 5 pillows secondary to back problems.  She has not noted worsening symptoms with exertion.  She had a stress test in [place] in 2006.  The records indicate that this was negative for perfusion defects her EF is 61%. She has a history of a heart murmur but does not think she's had an echocardiogram for many years.  I do not see a record of one at this time.  She also notes fairly frequent urination over the last several days.  She also  notes some dysuria.  She denies hematuria.  She denies fever or rigors.   Habits & Providers  Alcohol-Tobacco-Diet     Tobacco Status: current  EKG  Procedure date:  11/26/2008  Findings:      sinus rhythm, HR 65, normal axis, nonspecific ST-TW abnl. PRWP; PACs  Allergies (verified): No Known Drug Allergies  Past History:  Family History: Last updated: 11/12/2007 Mother, 25:  Dementia, Alzheimer's type, stroke Father, died 107:  MI, alcoholism 4 brothers:  1 brother died age 69, MI.  Other 3 healthy 3 Sisters:  1 sister died age [id number] complications of Down Syndrome.  1 Sister with hypertension.   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 Mother, 79:  Dementia, Alzheimer's type, stroke Father, died 52:  MI, alcoholism 4 brothers:  1 brother died age 68, MI.  Other 3 healthy 3 Sisters:  1 sister died age [id number] complications of Down Syndrome.  1 Sister with hypertension.   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 Lives alone.   Does have a female friend--sexually active occasionally. Current Smoker Smoking Status:  current  Review of Systems  The patient denies fever, syncope, prolonged cough, hemoptysis, melena, hematochezia, and hematuria.    Physical Exam  General:  well-developed, well-nourished, and  well-hydrated.   Head:  normocephalic and atraumatic.   Neck:  no JVD and no carotid bruits.   Chest Wall:  no tenderness.   Lungs:  normal respiratory effort, normal breath sounds, no crackles, and no wheezes.   Heart:  normal rate, regular rhythm, and Grade  2 /6 systolic ejection murmur heard best at the RUSB.  S2 is split.  Carotid upstrokes are normal.     Abdomen:  soft and non-tender.   Msk:  ? CVA tenderness on the left; pos. flank pain on left with palp. Pulses:  2+ DP/PT bilat. Extremities:  no edema Neurologic:  alert & oriented X3 and cranial nerves II-XII intact.   Skin:  warm and dry Cervical Nodes:  no anterior cervical adenopathy.   Psych:  normally interactive.     Impression & Recommendations:  Problem # 1:  CHEST PAIN, ATYPICAL (ICD-786.59) This is most likely pain secondary to possible UTI versus uncomplicated pyelonephritis.  However, she has had some visits to the emergency room for chest pain as well some visits to the clinic with chest pain.  She has multiple cardiac risk factors including hypertension and diabetes mellitus, family history and smoking history.  She has not been evaluated for ischemia in over 4 years.  I will refer her to cardiology for possible stress testing.  Her current pain has been ongoing for several days and is not likely cardiac.  She will have a troponin with her blood work from today.  I suspect that this will be normal.  However, if it is elevated she will be sent to the emergency room.  She has been asked to start on an ASA 81 mg daily for now.  Orders: 2 D Echo (2 D Echo) 12 Lead EKG (12 Lead EKG) T-Comprehensive Metabolic Panel ([id number]) T-CBC w/Diff [id number]) T-Troponin I [id number]) Cardiology Referral (Cardiology)  Problem # 2:  URINARY TRACT INFECTION SITE NOT SPECIFIED (ICD-599.0) She does have flank pain on the left.  Her U/A is abnormal.  Will treat for UTI (? poss. uncomplicated pyelonephritis) with Cipro 500 two  times a day for 10 days.  WIll send urine for culture.  I suspect a lot of her symptoms are related to this.  Will also get a CMET, CBC.  She will return in 1 week for f/u.  She knows to go to the ED if she worsens.    Orders: T-Culture, Urine [id number]) UA Dipstick w/o Micro (automated)  (81003)  Her updated medication list for this problem includes:    Cipro 500 Mg Tabs (Ciprofloxacin hcl) .[name] Kitchen... Take 1 tablet by mouth two times a day until finished  Problem # 3:  MURMUR (ICD-785.2) Will get a 2D Echo to further eval. this.  Will also look for low EF or wall motion abnormalities given h/o chest pain. Orders: 2 D Echo (2 D Echo)  Complete Medication List: 1)  Cyclobenzaprine Hcl 10 Mg Tabs (Cyclobenzaprine hcl) .[name] Kitchen.. 1 tab by mouth q hs as needed pain 2)  Darvocet-n 100  100-650 Mg Tabs (Propoxyphene n-apap) .[name] Kitchen.. 1 tablet by mouth three times a day as needed for pain 3)  Feldene 20 Mg Caps (Piroxicam) .[name] Kitchen.. 1 tablet by mouth daily for neck/arm pain 4)  Benazepril Hcl 40 Mg Tabs (Benazepril hcl) .[name] Kitchen.. 1 tablet by mouth daily for blood pressure 5)  Norvasc 10 Mg Tabs (Amlodipine besylate) .[name] Kitchen.. 1 tablet by mouth daily for blood pressure 6)  Toprol Xl 50 Mg Tb24 (Metoprolol succinate) .[name] Kitchen.. 1 tab by mouth daily 7)  Wellbutrin Xl 300 Mg Tb24 (Bupropion hcl) .[name] Kitchen.. 1 cap by mouth daily 8)  Triamterene-hctz 37.5-25 Mg Tabs (Triamterene-hctz) .[name] Kitchen.. 1 tab by mouth q a.m. 9)  Fluticasone Propionate 50 Mcg/act Susp (Fluticasone propionate) .... 2 sprays each nostril daily 10)  Fluconazole 150 Mg Tabs (Fluconazole) .[name] Kitchen.. 1 tab by mouth daily for 5 days. 11)  Cipro 500 Mg Tabs (Ciprofloxacin hcl) .... Take 1 tablet by mouth two times a day until finished   Patient Instructions: 1)  Your symptoms are probably all related to a urinary tract infection. 2)  Take antibiotics until all finished. 3)  We will send you for an ultrasound on your heart (Echocardiogram) and refer you to cardiology for possible  stress test. 4)  Drink plenty of fluids.  Get plenty of rest. 5)  If you develop a fever of 101 or higher or feel worse, go to the ED. 6)  If you have worse chest pain, go to the ED. 7)  Take an Aspirin a day (81mg ) for now. 8)  Follow up in 1 week for recheck. Prescriptions: DARVOCET-N 100 100-650 MG  TABS (PROPOXYPHENE N-APAP) 1 tablet by mouth three times a day as needed for pain  #30 x 0   Entered and Authorized by:   Richardson Dopp PA-C   Signed by:   Richardson Dopp PA-C on 11/26/2008   Method used:   Print then Give to Patient   RxID:   [id number] CIPRO 500 MG TABS (CIPROFLOXACIN HCL) Take 1 tablet by mouth two times a day until finished  #20 x 0   Entered and Authorized by:   Richardson Dopp PA-C   Signed by:   Richardson Dopp PA-C on 11/26/2008   Method used:   Print then Give to Patient   RxID:   [id number]   Laboratory Results   Urine Tests  Date/Time Received: November 26, 2008 3:21 PM   Routine Urinalysis   Glucose: negative   (Normal Range: Negative) Bilirubin: small   (Normal Range: Negative) Ketone: negative   (Normal Range: Negative) Spec. Gravity: 1.025   (Normal Range: 1.003-1.035) Blood: trace-intact   (Normal Range: Negative) pH: 5.5   (Normal Range: 5.0-8.0) Protein: 30   (Normal Range: Negative) Urobilinogen: 0.2   (Normal Range: 0-1) Nitrite: negative   (Normal Range: Negative) Leukocyte Esterace: large   (Normal Range: Negative)

## 2010-06-28 NOTE — Assessment & Plan Note (Signed)
Summary: Acute - Right breast Pain   Vital Signs:  Patient profile:   70 year old female Menstrual status:  hysterectomy Height:      66.75 inches Weight:      198 pounds BMI:     31.36 BSA:     2.01 Temp:     97.6 degrees F oral Pulse rate:   70 / minute Pulse rhythm:   regular Resp:     16 per minute BP sitting:   130 / 60  (left arm) Cuff size:   large  Vitals Entered By: Isla Pence (November 12, 2008 10:37 AM) CC: right breast core or vein that she can feel and has never been able to feel that before Pain Assessment Patient in pain? no       Does patient need assistance? Functional Status Self care Ambulation Normal   CC:  right breast core or vein that she can feel and has never been able to feel that before.  History of Present Illness:  Pt into the office with complaint of right breast tenderness. Palpable area of concern Felt area on right breast about 5 days ago.  It has stayed the same since first palpated Slight pain but only when palpated no drainage from nipples last mammogram in 07/2008 Denies any trauma wears bras daily   Allergies (verified): No Known Drug Allergies  Review of Systems General:  right breast abnormal.  Physical Exam  General:  alert.   Head:  normocephalic.   Breasts:  pendulous breast right breast - palpable linear area in RUQ extends about 3cm in length.  no tenderness with palpation left breast - no masses or lumps noted Abdomen:  normal bowel sounds.     Impression & Recommendations:  Problem # 1:  BREAST PAIN, RIGHT (ICD-611.71) ? pulled ligament Mammogram 07/2008 normal. advised warm compresses take aleve as needed for next week. If still problematic RTC and may need re-imaging  Complete Medication List: 1)  Cyclobenzaprine Hcl 10 Mg Tabs (Cyclobenzaprine hcl) .[name] Kitchen.. 1 tab by mouth q hs as needed pain 2)  Darvocet-n 100 100-650 Mg Tabs (Propoxyphene n-apap) .[name] Kitchen.. 1 tablet by mouth three times a day as needed for  pain 3)  Feldene 20 Mg Caps (Piroxicam) .[name] Kitchen.. 1 tablet by mouth daily for neck/arm pain 4)  Benazepril Hcl 40 Mg Tabs (Benazepril hcl) .[name] Kitchen.. 1 tablet by mouth daily for blood pressure 5)  Norvasc 10 Mg Tabs (Amlodipine besylate) .[name] Kitchen.. 1 tablet by mouth daily for blood pressure 6)  Toprol Xl 50 Mg Tb24 (Metoprolol succinate) .[name] Kitchen.. 1 tab by mouth daily 7)  Wellbutrin Xl 300 Mg Tb24 (Bupropion hcl) .[name] Kitchen.. 1 cap by mouth daily 8)  Triamterene-hctz 37.5-25 Mg Tabs (Triamterene-hctz) .[name] Kitchen.. 1 tab by mouth q a.m. 9)  Fluticasone Propionate 50 Mcg/act Susp (Fluticasone propionate) .... 2 sprays each nostril daily 10)  Fluconazole 150 Mg Tabs (Fluconazole) .[name] Kitchen.. 1 tab by mouth daily for 5 days.  Patient Instructions: 1)  Area of concern in right breast may be due to an inflammed ligament of vessel in the breast 2)  apply the hot water bottle twice per day 3)  Take the aleve one tablet by mouth two times a day for 3 days for inflammation (samples) 4)  Follow up as needed for sooner if right breast continues to be concern.

## 2010-06-28 NOTE — Miscellaneous (Signed)
Summary: Rehab Report /INITIAL SUMMARY  Rehab Report /INITIAL SUMMARY   Imported By: Roland Earl 10/14/2007 08:34:12  _____________________________________________________________________  External Attachment:    Type:   Image     Comment:   External Document

## 2010-06-28 NOTE — Letter (Signed)
Summary: REPORT NAME: [facility] 2-D ECHO  REPORT NAME: [facility] 2-D ECHO   Imported By: Roland Earl 01/01/2009 09:55:52  _____________________________________________________________________  External Attachment:    Type:   Image     Comment:   External Document

## 2010-06-28 NOTE — Progress Notes (Signed)
Summary: Office Visit/DEPRESSION SCREENING  Office Visit/DEPRESSION SCREENING   Imported By: Roland Earl 11/05/2009 10:27:38  _____________________________________________________________________  External Attachment:    Type:   Image     Comment:   External Document

## 2010-06-28 NOTE — Letter (Signed)
Summary: *Referral Letter  [facility]  [address] [place], [place] [address]   Phone: [phone]  Fax: [phone]    08/14/2009  Thank you in advance for agreeing to see my patient:  Diane [name] [address] [place], [place]  [address]  Phone: [phone]  Reason for Referral:  Recently noted mild anemia with hgb of 11.0 and low iron saturation with normal VB12.  Pt. denies melena or hematochezia.  No GI symptoms.  Has not returned Guaiac cards.  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) 10)  CHEST PAIN, ATYPICAL (ICD-786.59) 11)  ALLERGIC RHINITIS (ICD-477.9) 12)  VAGINITIS (ICD-616.10) 13)  NEED PROPHYLACTIC VACCINATION&INOCULATION FLU (ICD-V04.81) 14)  CAROTID BRUIT, RIGHT (ICD-785.9) 15)  ENCOUNTER FOR LONG-TERM USE OF OTHER MEDICATIONS (ICD-V58.69) 16)  HEALTH MAINTENANCE EXAM (ICD-V70.0) 17)  DEGENERATIVE DISC DISEASE, CERVICAL SPINE (ICD-722.4) 18)  DEGENERATIVE DISC DISEASE, LUMBOSACRAL SPINE (ICD-722.52) 19)  SHOULDER PAIN, LEFT, 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) 20)  IRREGULAR HEART RATE (ICD-427.9) 21)  BRONCHITIS, ACUTE (ICD-466.0) 22)  HYPERTENSION, BENIGN ESSENTIAL (ICD-401.1) 23)  DEPRESSION (ICD-311) 24)  MURMUR (ICD-785.2)   Current Medications: 1)  CYCLOBENZAPRINE HCL 10 MG  TABS (CYCLOBENZAPRINE HCL) 1 tab by mouth q hs as needed pain 2)  DARVOCET-N 100 100-650 MG  TABS (PROPOXYPHENE N-APAP) 1 tablet by mouth three times a day as needed for pain 3)  FELDENE 20 MG  CAPS (PIROXICAM) 1 tablet by mouth daily for neck/arm pain 4)  BENAZEPRIL HCL 40 MG  TABS  (BENAZEPRIL HCL) 1 tablet by mouth daily for blood pressure 5)  NORVASC 10 MG  TABS (AMLODIPINE BESYLATE) 1 tablet by mouth daily for blood pressure 6)  TOPROL XL 50 MG  TB24 (METOPROLOL SUCCINATE) 1 tab by mouth daily 7)  WELLBUTRIN XL 300 MG  TB24 (BUPROPION HCL) 1 cap by mouth daily 8)  TRIAMTERENE-HCTZ 37.5-25 MG  TABS (TRIAMTERENE-HCTZ) 1 tab by mouth q a.m. 9)  FLUTICASONE PROPIONATE 50 MCG/ACT SUSP (FLUTICASONE PROPIONATE) 2 sprays each nostril daily 10)  FLUCONAZOLE 150 MG TABS (FLUCONAZOLE) 1 tab by mouth daily for 5 days. 11)  NITROGLYCERIN 0.4 MG SUBL (NITROGLYCERIN) dissolve one pill under tounge every 5 min as needed forl chest pain do not exceed 3 in 15 min 12)  METFORMIN HCL 500 MG TABS (METFORMIN HCL) 1 tab by mouth daily with breakfast for 1 week, then 1 tab by mouth two times a day with meals 13)  * GLUCOMETER test sugars once daily 14)  * GLUCOMETER TEST STRIPS test once daily 15)  LANCETS  MISC (LANCETS) once daily testing 16)  AZITHROMYCIN 250 MG TABS (AZITHROMYCIN) 2 tabs by mouth today, then 1 tab by mouth daily for 4 more days 17)  PROVENTIL HFA 108 (90 BASE) MCG/ACT AERS (ALBUTEROL SULFATE) 2 puffs every 4 hours as needed for chest tightness 18)  ASPIR-LOW 81 MG TBEC (ASPIRIN) 1 tab by mouth daily with meal 19)  MACROBID 100 MG CAPS (NITROFURANTOIN MONOHYD MACRO) 1 tab by mouth two times a day for 3 days 20)  FERROUS SULFATE 325 (65 FE) MG TABS (FERROUS SULFATE) 1 tab by mouth daily--otc   Past Medical History: 1)  DEGENERATIVE DISC DISEASE,  CERVICAL SPINE (ICD-722.4) 2)  DEGENERATIVE DISC DISEASE, LUMBOSACRAL SPINE (ICD-722.52) 3)  SHOULDER PAIN, LEFT, 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) 4)  IRREGULAR HEART RATE (ICD-427.9) 5)  BRONCHITIS, ACUTE (ICD-466.0) 6)  HYPERTENSION, BENIGN ESSENTIAL (ICD-401.1) 7)  DEPRESSION (ICD-311) 8)  MURMUR (ICD-785.2) 9)  Borderline Diabetes   Prior History of Blood Transfusions:   Pertinent Labs:    Thank you again for agreeing to see  our patient; please contact us if you have any further questions or need additional information.  Sincerely,  Mack Hook MD

## 2010-06-28 NOTE — Letter (Signed)
Summary: MAILED RECORDS TO [facility]  MAILED RECORDS TO [facility]   Imported By: Roland Earl 07/16/2008 08:59:27  _____________________________________________________________________  External Attachment:    Type:   Image     Comment:   External Document

## 2010-06-28 NOTE — Assessment & Plan Note (Signed)
Summary: FLU SHOT////KT  Nurse Visit    Prior Medications: CYCLOBENZAPRINE HCL 10 MG  TABS (CYCLOBENZAPRINE HCL) 1 tab by mouth q hs as needed pain DARVOCET-N 100 100-650 MG  TABS (PROPOXYPHENE N-APAP) 1 tablet by mouth three times a day as needed for pain FELDENE 20 MG  CAPS (PIROXICAM) 1 tablet by mouth daily for neck/arm pain BENAZEPRIL HCL 40 MG  TABS (BENAZEPRIL HCL) 1 tablet by mouth daily for blood pressure NORVASC 10 MG  TABS (AMLODIPINE BESYLATE) 1 tablet by mouth daily for blood pressure TOPROL XL 50 MG  TB24 (METOPROLOL SUCCINATE) 1 tab by mouth daily WELLBUTRIN XL 300 MG  TB24 (BUPROPION HCL) 1 cap by mouth daily TRIAMTERENE-HCTZ 37.5-25 MG  TABS (TRIAMTERENE-HCTZ) 1 tab by mouth q a.m. Current Allergies: No known allergies    Pneumovax Vaccine    Vaccine Type: Pneumovax (Medicare)    Site: left deltoid    Mfr: Merck    Dose: 0.5 ml    Route: IM    Given by: Bridgett Larsson RN    Exp. Date: 12/17/2008    Lot #: [id number]    VIS given: 12/25/95 version given March 11, 2008.  Influenza Vaccine    Vaccine Type: Fluvax MCR    Site: right deltoid    Mfr: [place]    Dose: 0.5 ml    Route: IM    Given by: Bridgett Larsson RN    Exp. Date: 11/25/2008    Lot #: [id number]    VIS given: 12/20/06 version given March 11, 2008.  Flu Vaccine Consent Questions    Do you have a history of severe allergic reactions to this vaccine? no    Any prior history of allergic reactions to egg and/or gelatin? no    Do you have a sensitivity to the preservative Thimersol? no    Do you have a past history of Guillan-Barre Syndrome? no    Do you currently have an acute febrile illness? no    Have you ever had a severe reaction to latex? no    Vaccine information given and explained to patient? yes    Are you currently pregnant? no   Orders Added: 1)  Influenza Vaccine MCR [00025] 2)  Pneumoccal Vaccine Adm- Medicare [id number] 3)  Admin 1st Vaccine [id number] 4)  Est. Patient Nurse  visit [email or web address]    ]

## 2010-06-28 NOTE — Progress Notes (Signed)
   Phone Note Call from Patient Call back at [facility] Phone [phone]   Summary of Call: On Saturday, the  pt had an allergic reaction to the shrimps at [facility] and as results her face is swolling. [place] MD Initial call taken by: Alexis Goodell,  Oct 05, 2008 11:14 AM  Follow-up for Phone Call        Worked in for triage. Follow-up by: Shellia Carwin CMA,  Oct 05, 2008 11:54 AM

## 2010-06-28 NOTE — Assessment & Plan Note (Signed)
Summary: 3 MONTH FU////KT   Vital Signs:  Patient profile:   70 year old female Menstrual status:  hysterectomy Weight:      190.4 pounds Temp:     97.4 degrees F Pulse rate:   61 / minute Pulse rhythm:   regular Resp:     18 per minute BP sitting:   130 / 60  (left arm) Cuff size:   large  Vitals Entered By: Shellia Carwin CMA (April 02, 2009 9:38 AM) CC: 3 month check on dm, has bad cough also Is Patient Diabetic? Yes Pain Assessment Patient in pain? yes     Location: left shoulder Intensity: 5 CBG Result 180  Does patient need assistance? Ambulation Normal   CC:  3 month check on dm and has bad cough also.  History of Present Illness: 1.  DM:  states sugars generally in low to mid 100s.  Had a bagel before coming in today.  Has lost close to 3 pounds and trying to eat well.  Occasional exercise.  Waiting for Medicare coverage of pool visits to kick back in.   Cannot get coverage again until [facility]  A1C down to 6.1% today.    2.  Chest Pain:  Apparently had normal stress testing with Dr. Terrence Dupont.   She does have a follow up with him  Nov 24th.  Will need to check correspondence for this.  Did find Pharmocologic stress cardiolite in hospital records that showed a fixed defect at apex, felt to most likely be breast attenuation.  3.  3 weeks ago--runny nose, then congestion, now with what she feels is chest congestion.  Has tried her nasal spray, Robitussin DM.  Bringing up brown yellow mucous.  Bad taste with posterior pharyngeal drainage.    4.  Vaginal rawness on left labia majora.  have discussed before and did not see much.  Pt. states better than was previously--using vaseline currently.  Allergies (verified): No Known Drug Allergies  Physical Exam  General:  NAD, coarse cough. Head:  Tender over frontal and maxillary sinuses Eyes:  No corneal or conjunctival inflammation noted. EOMI. Perrla. Funduscopic exam benign, without  hemorrhages, exudates or papilledema. Vision grossly normal. Ears:  External ear exam shows no significant lesions or deformities.  Otoscopic examination reveals clear canals, tympanic membranes are intact bilaterally without bulging, retraction, inflammation or discharge. Hearing is grossly normal bilaterally. Nose:  nasal dischargemucosal pallor.   Mouth:  pharynx pink and moist.   Neck:  No deformities, masses, or tenderness noted. Lungs:  Scattered wheeze--very mild Heart:  Normal rate and regular rhythm. S1 and S2 normal without gallop, murmur, click, rub or other extra sounds.  Diabetes Management Exam:    Foot Exam (with socks and/or shoes not present):       Sensory-Monofilament:          Left foot: normal          Right foot: normal   Impression & Recommendations:  Problem # 1:  DIABETES MELLITUS, TYPE II (ICD-250.00)  Improved control--continue Metformin. Flu shot today Foot exam Her updated medication list for this problem includes:    Benazepril Hcl 40 Mg Tabs (Benazepril hcl) .[name] Kitchen... 1 tablet by mouth daily for blood pressure    Metformin Hcl 500 Mg Tabs (Metformin hcl) .[name] Kitchen... 1 tab by mouth daily with breakfast for 1 week, then 1 tab by mouth two times a day with meals  Orders: Hgb A1C [id number])  Problem #  2:  CHEST PAIN, ATYPICAL (ICD-786.59) Appears to be noncardiac, but need Dr. Zenia Resides records  Problem # 3:  SINUSITIS, ACUTE (ICD-461.9)  Her updated medication list for this problem includes:    Fluticasone Propionate 50 Mcg/act Susp (Fluticasone propionate) .[name] Kitchen... 2 sprays each nostril daily    Azithromycin 250 Mg Tabs (Azithromycin) .[name] Kitchen... 2 tabs by mouth today, then 1 tab by mouth daily for 4 more days  Problem # 4:  BRONCHITIS, ACUTE (ICD-466.0)  Her updated medication list for this problem includes:    Azithromycin 250 Mg Tabs (Azithromycin) .[name] Kitchen... 2 tabs by mouth today, then 1 tab by mouth daily for 4 more days    Proventil Hfa 108 (90 Base) Mcg/act Aers  (Albuterol sulfate) .[name] Kitchen... 2 puffs every 4 hours as needed for chest tightness  Complete Medication List: 1)  Cyclobenzaprine Hcl 10 Mg Tabs (Cyclobenzaprine hcl) .[name] Kitchen.. 1 tab by mouth q hs as needed pain 2)  Darvocet-n 100 100-650 Mg Tabs (Propoxyphene n-apap) .[name] Kitchen.. 1 tablet by mouth three times a day as needed for pain 3)  Feldene 20 Mg Caps (Piroxicam) .[name] Kitchen.. 1 tablet by mouth daily for neck/arm pain 4)  Benazepril Hcl 40 Mg Tabs (Benazepril hcl) .[name] Kitchen.. 1 tablet by mouth daily for blood pressure 5)  Norvasc 10 Mg Tabs (Amlodipine besylate) .[name] Kitchen.. 1 tablet by mouth daily for blood pressure 6)  Toprol Xl 50 Mg Tb24 (Metoprolol succinate) .[name] Kitchen.. 1 tab by mouth daily 7)  Wellbutrin Xl 300 Mg Tb24 (Bupropion hcl) .[name] Kitchen.. 1 cap by mouth daily 8)  Triamterene-hctz 37.5-25 Mg Tabs (Triamterene-hctz) .[name] Kitchen.. 1 tab by mouth q a.m. 9)  Fluticasone Propionate 50 Mcg/act Susp (Fluticasone propionate) .... 2 sprays each nostril daily 10)  Fluconazole 150 Mg Tabs (Fluconazole) .[name] Kitchen.. 1 tab by mouth daily for 5 days. 11)  Nitroglycerin 0.4 Mg Subl (Nitroglycerin) .... Dissolve one pill under tounge every 5 min as needed forl chest pain do not exceed 3 in 15 min 12)  Metformin Hcl 500 Mg Tabs (Metformin hcl) .[name] Kitchen.. 1 tab by mouth daily with breakfast for 1 week, then 1 tab by mouth two times a day with meals 13)  Glucometer  .... Test sugars once daily 14)  Glucometer Test Strips  .... Test once daily 15)  Lancets Misc (Lancets) .... Once daily testing 16)  Azithromycin 250 Mg Tabs (Azithromycin) .... 2 tabs by mouth today, then 1 tab by mouth daily for 4 more days 17)  Proventil Hfa 108 (90 Base) Mcg/act Aers (Albuterol sulfate) .... 2 puffs every 4 hours as needed for chest tightness  Other Orders: Flu Vaccine 20yrs + [id number]) Admin 1st Vaccine [phone]) Admin 1st Vaccine [facility]) [phone])  Patient Instructions: 1)  CPP with Dr. Amil Amen in 4 months Prescriptions: PROVENTIL HFA 108 (90 BASE) MCG/ACT AERS (ALBUTEROL SULFATE) 2  puffs every 4 hours as needed for chest tightness  #1 x 0   Entered and Authorized by:   Mack Hook MD   Signed by:   Mack Hook MD on 04/02/2009   Method used:   Electronically to        [facility] [phone]* (retail)       [place], [place]  [address]       Ph: [phone]       Fax: [phone]   RxID:   [id number] AZITHROMYCIN 250 MG TABS (AZITHROMYCIN) 2 tabs by mouth today, then 1 tab by mouth daily for 4 more days  #6  x 0   Entered and Authorized by:   Mack Hook MD   Signed by:   Mack Hook MD on 04/02/2009   Method used:   Electronically to        [facility] [phone]* (retail)       [place], [place]  [address]       Ph: [phone]       Fax: [phone]   RxID:   [id number]    Influenza Vaccine    Vaccine Type: Fluvax 3+    Site: right deltoid    Mfr: [place]    Dose: 0.5 ml    Route: IM    Given by: Shellia Carwin CMA    Exp. Date: 11/25/2009    Lot #: [id number]    VIS given: 12/20/06 version given April 02, 2009.  Flu Vaccine Consent Questions    Do you have a history of severe allergic reactions to this vaccine? no    Any prior history of allergic reactions to egg and/or gelatin? no    Do you have a sensitivity to the preservative Thimersol? no    Do you have a past history of Guillan-Barre Syndrome? no    Do you currently have an acute febrile illness? no    Have you ever had a severe reaction to latex? no    Vaccine information given and explained to patient? yes    Are you currently pregnant? no   Last LDL:                                                 98 (11/12/2007 9:41:00 PM)          Diabetic Foot Exam    10-g (5.07) Semmes-Weinstein Monofilament Test Performed by: Shellia Carwin CMA          Right Foot          Left Foot Visual Inspection               Test Control      normal         normal Site 1         normal         normal Site 2          normal         normal Site 3         normal         normal Site 4         normal         normal Site 5         normal         normal Site 6         normal         normal Site 7         normal         normal Site 8         normal         normal Site 9         normal         normal Site 10         normal         normal  Impression  normal         normal

## 2010-06-28 NOTE — Miscellaneous (Signed)
Summary: Rehab Report/DISCHARGE SUMMARY  Rehab Report/DISCHARGE SUMMARY   Imported By: Roland Earl 11/18/2007 11:56:41  _____________________________________________________________________  External Attachment:    Type:   Image     Comment:   External Document

## 2010-06-28 NOTE — Progress Notes (Signed)
Summary: Podiatry referral  Phone Note Outgoing Call   Summary of Call: NOra--podiatryreferral for diabetic care/toenail clipping.  DM well controlled Initial call taken by: Mack Hook MD,  April 11, 2010 9:33 AM  Follow-up for Phone Call        PT HAVE AN APPT @ [place]  04-15-10 @ 11 AM PT AWARE OF HER APPT  Follow-up by: Maren Reamer,  April 11, 2010 12:03 PM

## 2010-06-28 NOTE — Letter (Signed)
Summary: [facility]  [facility]   Imported By: Roland Earl 11/05/2009 10:37:18  _____________________________________________________________________  External Attachment:    Type:   Image     Comment:   External Document

## 2010-06-28 NOTE — Letter (Signed)
Summary: [employer]//MEDICARE SUPPLEMENT PLAN  [employer]//MEDICARE SUPPLEMENT PLAN   Imported By: Roland Earl 09/29/2009 09:51:15  _____________________________________________________________________  External Attachment:    Type:   Image     Comment:   External Document

## 2010-06-28 NOTE — Letter (Signed)
Summary: [facility]  [facility]   Imported By: Roberto Scales 09/03/2008 16:45:56  _____________________________________________________________________  External Attachment:    Type:   Image     Comment:   External Document

## 2010-06-28 NOTE — Letter (Signed)
Summary: COLONOSCOPY PROCEDURE  COLONOSCOPY PROCEDURE   Imported By: Roland Earl 11/05/2009 10:47:01  _____________________________________________________________________  External Attachment:    Type:   Image     Comment:   External Document

## 2010-06-28 NOTE — Letter (Signed)
Summary: [employer] MEDICARE SUPPLEMENT   [employer] MEDICARE SUPPLEMENT   Imported By: Roland Earl 03/30/2010 11:03:44  _____________________________________________________________________  External Attachment:    Type:   Image     Comment:   External Document

## 2010-06-28 NOTE — Assessment & Plan Note (Signed)
Summary: 1 month fu///kt   Vital Signs:  Patient profile:   70 year old female Menstrual status:  hysterectomy Weight:      176 pounds Temp:     98.8 degrees F Pulse rate:   52 / minute Pulse rhythm:   regular Resp:     16 per minute BP sitting:   133 / 67  (left arm) Cuff size:   large  Vitals Entered By: Shellia Carwin CMA (December 03, 2009 11:06 AM) CC: one month f/u depression, she has seen Estill Bamberg and goes back to her next week. Is Patient Diabetic? Yes Pain Assessment Patient in pain? no      CBG Result 83  Does patient need assistance? Ambulation Normal   CC:  one month f/u depression and she has seen Estill Bamberg and goes back to her next week.[name] Kitchen  History of Present Illness: 1.  Vaginal inflammation:  did have a biopsy with gyne/Dr. Raphael Gibney.  Had done 2 weeks ago and waiting for results.    2.  Depression:  Saw Birdie Hopes and continuing every 2 weeks with counseling.  Fluoxetine is helping--definitely.  Getting better sleep.  Has energy in day and on awakening.  Looks forward to day.  Starting to feel normal.    Allergies (verified): 1)  ! 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 Raphael Gibney  Problem # 2:  DEPRESSION (ICD-311) Much improved Her updated medication list for this problem includes:    Fluoxetine Hcl 10 Mg Tabs (Fluoxetine hcl) .[name] Kitchen... 1 tab by mouth daily for 7 days, then increase to 2 tabs by mouth daily  Complete Medication List: 1)  Benazepril Hcl 40 Mg Tabs (Benazepril hcl) .[name] Kitchen.. 1 tablet by mouth daily for blood pressure 2)  Norvasc 10 Mg Tabs (Amlodipine besylate) .[name] Kitchen.. 1 tablet by mouth daily for blood pressure 3)  Toprol Xl 50 Mg Tb24 (Metoprolol succinate) .[name] Kitchen.. 1 tab by mouth daily 4)  Triamterene-hctz 37.5-25 Mg Tabs (Triamterene-hctz) .[name] Kitchen.. 1 tab by mouth q a.m. 5)  Nitroglycerin 0.4 Mg Subl (Nitroglycerin) ....  Dissolve one pill under tounge every 5 min as needed forl chest pain do not exceed 3 in 15 min 6)  Metformin Hcl 500 Mg Tabs (Metformin hcl) .... One tablet by mouth two times a day for blood sugar 7)  Glucometer  .... Test sugars once daily 8)  Glucometer Test Strips  .... Test once daily 9)  Lancets Misc (Lancets) .... Once daily testing 10)  Proventil Hfa 108 (90 Base) Mcg/act Aers (Albuterol sulfate) .... 2 puffs every 4 hours as needed for chest tightness 11)  Aspir-low 81 Mg Tbec (Aspirin) .[name] Kitchen.. 1 tab by mouth daily with meal 12)  Ferrous Sulfate 325 (65 Fe) Mg Tabs (Ferrous sulfate) .[name] Kitchen.. 1 tab by mouth daily--otc 13)  Valacyclovir Hcl 500 Mg Tabs (Valacyclovir hcl) .[name] Kitchen.. 1 tab by mouth daily--dr. stringer 14)  Lidocaine Ointment 5 %  .... Apply as needed to affected area of vagina 15)  Fluoxetine Hcl 10 Mg Tabs (Fluoxetine hcl) .[name] Kitchen.. 1 tab by mouth daily for 7 days, then increase to 2 tabs by mouth daily 16)  Epipen 0.3 Mg/0.41ml Devi (Epinephrine) .... Use as directed as needed if accidental exposure and reaction to shellfish  Other Orders: Capillary Blood Glucose/CBG [id number])  Patient Instructions: 1)  Follow up with Dr. Amil Amen in 4 months --depression, DM, htn.  Prescriptions: FLUOXETINE HCL 10 MG TABS (FLUOXETINE HCL) 1 tab by mouth daily for 7 days, then increase to 2 tabs by mouth daily  #60 x 11   Entered and Authorized by:   Mack Hook MD   Signed by:   Mack Hook MD on 12/03/2009   Method used:   Electronically to        [facility] [phone]* (retail)       [place], [place]  [address]       Ph: [phone]       Fax: [phone]   RxID:   [id number] Sharp 0.3 MG/0.3ML DEVI (EPINEPHRINE) Use as directed as needed if accidental exposure and reaction to shellfish  #1 x 1   Entered and Authorized by:   Mack Hook MD   Signed by:   Mack Hook MD on 12/03/2009   Method used:   Electronically to        [facility] [phone]* (retail)       [address]       [place], [place]  [address]       Ph: [phone]       Fax: [phone]   RxID:   [id number]

## 2010-06-28 NOTE — Progress Notes (Signed)
Summary: ORDER FOR WATER THERAPY   Phone Note Call from Patient Call back at Home Phone [phone]   Caller: Patient Summary of Call: [place] TO GIVE TO [facility] THAT SHE CAN ATTEND WATER THERAPY. HER THERAPIST AT [facility] GAVE HER A LIST OF PLACES FOR WATER THERAPY. Initial call taken by: Roberto Scales,  Oct 18, 2007 11:33 AM  Follow-up for Phone Call        forward to [place] by: Vinetta Bergamo CMA,  Oct 18, 2007 5:38 PM  Additional Follow-up for Phone Call Additional follow up Details #1::        order printed. Additional Follow-up by: Mack Hook MD,  Oct 18, 2007 6:49 PM    Additional Follow-up for Phone Call Additional follow up Details #2::    Pt aware she will come by and pick it up. Follow-up by: Shellia Carwin CMA,  Oct 24, 2007 10:42 AM

## 2010-06-28 NOTE — Letter (Signed)
Summary: [facility]   Imported By: Roland Earl 09/27/2009 11:39:44  _____________________________________________________________________  External Attachment:    Type:   Image     Comment:   External Document

## 2010-06-28 NOTE — Assessment & Plan Note (Signed)
Summary: GENERAL VISIT//KT   Vital Signs:  Patient Profile:   70 Years Old Female Height:     66.75 inches Weight:      209 pounds BMI:     33.10 Temp:     98.1 degrees F Pulse rate:   56 / minute Pulse rhythm:   regular Resp:     16 per minute BP sitting:   148 / 90  (left arm) Cuff size:   large  Pt. in pain?   yes    Location:   legs  Vitals Entered By: Shellia Carwin CMA (May 05, 2008 8:55 AM)                  Chief Complaint:  Feels raw when she wipes and has used various creams and things and also has strange odor to vaginal area..  History of Present Illness: 1.  Vaginal irritation--states she is raw.  Has been using premarin cream 3-4 times weekly--states it is something prescribed here over a year ago--but not documentation in chart for over a year that shows the cream.  EMR goes back to 8/08.  Pt. admits later, she had not used the Premarin cream for some time until 2 weeks ago.  Her symptoms actually do not date back to over 1 year ago, but more so in recent weeks.  Has had some discharge with odor--like old blood.  Pt. denies any intercourse for past 2 years.  Pt. is status post TAH/BSO.  Did find in chart she comlained of this at 6/09 visit.  2.  Hyperglycemia:  Did see Diabetic Educator.  Feels she did try to change dietary habits.  Did also by a dog, but  limited by low back pain and radicular pain in walking the dog.  3.  Hypertension:  Pt. denies missing bp meds.  4.  Lumbosacral DDD with radicular pain.  Right leg.  Would like referral to Neurosurgery for the possibility of injections for treatment again as helped previously.   5.  Posterior pharyngeal drainage--causes her to cough fair amt.  Allegra has not helped in past.  Has not used any sort of nasal spray in years.    Prior Medications Reviewed Using: Patient Recall  Current Allergies: No known allergies       Physical Exam  General:     overweight, NAD Ears:     External ear exam  shows no significant lesions or deformities.  Otoscopic examination reveals clear canals, tympanic membranes are intact bilaterally without bulging, retraction, inflammation or discharge. Hearing is grossly normal bilaterally. Nose:     mucosal erythema and mucosal edema.   Mouth:     pharynx pink and moist.   Lungs:     Normal respiratory effort, chest expands symmetrically. Lungs are clear to auscultation, no crackles or wheezes. Heart:     Normal rate and regular rhythm. S1 and S2 normal without gallop, murmur, click, rub or other extra sounds. Genitalia:     External genitalia appears fairly normal.  At introitus, quite inflammed and irritated with thin white discharge.  No definite odor.  Vaginal mucosa also somewhat inflammed, friable with thin white discharge.    Impression & Recommendations:  Problem # 1:  VAGINITIS (ICD-616.10) Possible combination Suspect mild atrophy as well as BV and possibly yeast as well. Her updated medication list for this problem includes:    Metronidazole 500 Mg Tabs (Metronidazole) .[name] Kitchen... 1 tab by mouth two times a day for 7 days Fluconazole  150 mg for 2 doses after completing Metronidazole.   Problem # 2:  HYPERGLYCEMIA, FASTING (ICD-790.29) Suspect has not made significant changes. A1C returned after pt. left--Control with diet is okay.   Discuss medication at 4 month follow up if no weight loss or improvement in A1C Orders: Hgb A1C [id number]) Capillary Blood Glucose [id number])   Problem # 3:  ALLERGIC RHINITIS (ICD-477.9)  Her updated medication list for this problem includes:    Fluticasone Propionate 50 Mcg/act Susp (Fluticasone propionate) .[name] Kitchen... 2 sprays each nostril daily   Problem # 4:  HYPERTENSION, BENIGN ESSENTIAL (ICD-401.1) Recheck BP in 2 weeks. Has been controlled. Her updated medication list for this problem includes:    Benazepril Hcl 40 Mg Tabs (Benazepril hcl) .[name] Kitchen... 1 tablet by mouth daily for blood pressure    Norvasc 10  Mg Tabs (Amlodipine besylate) .[name] Kitchen... 1 tablet by mouth daily for blood pressure    Toprol Xl 50 Mg Tb24 (Metoprolol succinate) .[name] Kitchen... 1 tab by mouth daily    Triamterene-hctz 37.5-25 Mg Tabs (Triamterene-hctz) .[name] Kitchen... 1 tab by mouth q a.m.   Problem # 5:  DEGENERATIVE DISC DISEASE, LUMBOSACRAL SPINE (ICD-722.52)  Orders: Neurosurgeon Referral (Neurosurgeon)   Complete Medication List: 1)  Cyclobenzaprine Hcl 10 Mg Tabs (Cyclobenzaprine hcl) .[name] Kitchen.. 1 tab by mouth q hs as needed pain 2)  Darvocet-n 100 100-650 Mg Tabs (Propoxyphene n-apap) .[name] Kitchen.. 1 tablet by mouth three times a day as needed for pain 3)  Feldene 20 Mg Caps (Piroxicam) .[name] Kitchen.. 1 tablet by mouth daily for neck/arm pain 4)  Benazepril Hcl 40 Mg Tabs (Benazepril hcl) .[name] Kitchen.. 1 tablet by mouth daily for blood pressure 5)  Norvasc 10 Mg Tabs (Amlodipine besylate) .[name] Kitchen.. 1 tablet by mouth daily for blood pressure 6)  Toprol Xl 50 Mg Tb24 (Metoprolol succinate) .[name] Kitchen.. 1 tab by mouth daily 7)  Wellbutrin Xl 300 Mg Tb24 (Bupropion hcl) .[name] Kitchen.. 1 cap by mouth daily 8)  Triamterene-hctz 37.5-25 Mg Tabs (Triamterene-hctz) .[name] Kitchen.. 1 tab by mouth q a.m. 9)  Metronidazole 500 Mg Tabs (Metronidazole) .[name] Kitchen.. 1 tab by mouth two times a day for 7 days 10)  Fluconazole 150 Mg Tabs (Fluconazole) .[name] Kitchen.. 1 tab by mouth day after finishing metronidazole.  repeat dose 2 days later. 11)  Fluticasone Propionate 50 Mcg/act Susp (Fluticasone propionate) .... 2 sprays each nostril daily   Patient Instructions: 1)  BP check and progress report in 2 weeks--nurse visit. 2)  Folllow up with Dr. Amil Amen in 4 months.  Call for appt. 3)  Take the Fluconazole the day after you finish the Metronidazole.  Repeat the Fluconazole 2 days later.   Prescriptions: FLUTICASONE PROPIONATE 50 MCG/ACT SUSP (FLUTICASONE PROPIONATE) 2 sprays each nostril daily  #1 x 11   Entered and Authorized by:   Mack Hook MD   Signed by:   Mack Hook MD on 05/05/2008   Method used:    Electronically to        [facility] [phone]* (retail)       [place],   [address]       Ph: [phone]       Fax: [phone]   RxID:   [id number] FLUCONAZOLE 150 MG TABS (FLUCONAZOLE) 1 tab by mouth day after finishing Metronidazole.  Repeat dose 2 days later.  #2 x 0   Entered and Authorized by:   Mack Hook MD   Signed by:   Mack Hook MD on 05/05/2008   Method  used:   Electronically to        [facility] [phone]* (retail)       [place], [place]  [address]       Ph: [phone]       Fax: [phone]   RxID:   [id number] METRONIDAZOLE 500 MG TABS (METRONIDAZOLE) 1 tab by mouth two times a day for 7 days  #14 x 0   Entered and Authorized by:   Mack Hook MD   Signed by:   Mack Hook MD on 05/05/2008   Method used:   Electronically to        [facility] [phone]* (retail)       [address]       [place], [place]  [address]       Ph: [phone]       Fax: [phone]   RxID:   [id number]  ] Laboratory Results   Blood Tests     HGBA1C: 6.2%   (Normal Range: Non-Diabetic - 3-6%   Control Diabetic - 6-8%) CBG Fasting:: 107    [place]/KOH Source: vaginal WBC/hpf: 5-10 Bacteria/hpf: 2+ Clue cells/hpf: few  Negative whiff Yeast/hpf: few-?pseudohyphae Trichomonas/hpf: none        Laboratory Results   Blood Tests     HGBA1C: 6.2%   (Normal Range: Non-Diabetic - 3-6%   Control Diabetic - 6-8%) CBG Fasting:: 107mg /dL    [place]  Negative whiff [place] KOH: not seen in [place]

## 2010-06-28 NOTE — Miscellaneous (Signed)
Summary: Rehab Report /INITIAL SUMMARY/FAXED  Rehab Report /INITIAL SUMMARY/FAXED   Imported By: Roland Earl 11/08/2007 15:35:21  _____________________________________________________________________  External Attachment:    Type:   Image     Comment:   External Document

## 2010-06-28 NOTE — Letter (Signed)
Summary: *Referral Letter  [facility]  [address] [place], [place] [address]   Phone: [phone]  Fax: [phone]    05/05/2008  Dear Doctor:  Thank you in advance for agreeing to see my patient:  Diane [name] [address] [place],   [address]  Phone: [phone]  Reason for Referral: Severe multifactorial L5/S1 stenosis.  Pt. with mainly right radicular pain and low back  pain, but some symptoms on left as well at times.  Procedures Requested:Evaluation and recommendations for treatment.  Patient did respond to injections some time ago.   Current Medical Problems: 1)  ALLERGIC RHINITIS (ICD-477.9) 2)  VAGINITIS (ICD-616.10) 3)  NEED PROPHYLACTIC VACCINATION&INOCULATION FLU (ICD-V04.81) 4)  HYPERGLYCEMIA, FASTING (ICD-790.29) 5)  CAROTID BRUIT, RIGHT (ICD-785.9) 6)  ENCOUNTER FOR LONG-TERM USE OF OTHER MEDICATIONS (ICD-V58.69) 7)  HEALTH MAINTENANCE EXAM (ICD-V70.0) 8)  DEGENERATIVE DISC DISEASE, CERVICAL SPINE (ICD-722.4) 9)  DEGENERATIVE DISC DISEASE, LUMBOSACRAL SPINE (ICD-722.52) 10)  SHOULDER PAIN, LEFT, 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) 11)  IRREGULAR HEART RATE (ICD-427.9) 12)  BRONCHITIS, ACUTE (ICD-466.0) 13)  HYPERTENSION, BENIGN ESSENTIAL (ICD-401.1) 14)  DEPRESSION (ICD-311) 15)  MURMUR (ICD-785.2)   Current Medications: 1)  CYCLOBENZAPRINE HCL 10 MG  TABS (CYCLOBENZAPRINE HCL) 1 tab by mouth q hs as needed pain 2)  DARVOCET-N 100 100-650 MG  TABS (PROPOXYPHENE N-APAP) 1 tablet by mouth three times a day as needed for pain 3)  FELDENE 20 MG  CAPS (PIROXICAM) 1 tablet by mouth daily for neck/arm pain 4)  BENAZEPRIL HCL 40 MG  TABS (BENAZEPRIL HCL) 1 tablet by mouth daily for blood pressure 5)  NORVASC 10 MG  TABS (AMLODIPINE BESYLATE) 1 tablet by mouth daily for blood pressure 6)  TOPROL XL 50 MG  TB24 (METOPROLOL SUCCINATE) 1 tab by mouth daily 7)  WELLBUTRIN XL 300 MG  TB24 (BUPROPION HCL) 1 cap by mouth daily 8)  TRIAMTERENE-HCTZ  37.5-25 MG  TABS (TRIAMTERENE-HCTZ) 1 tab by mouth q a.m. 9)  METRONIDAZOLE 500 MG TABS (METRONIDAZOLE) 1 tab by mouth two times a day for 7 days 10)  FLUCONAZOLE 150 MG TABS (FLUCONAZOLE) 1 tab by mouth day after finishing Metronidazole.  Repeat dose 2 days later. 11)  FLUTICASONE PROPIONATE 50 MCG/ACT SUSP (FLUTICASONE PROPIONATE) 2 sprays each nostril daily   Past Medical History: 1)  DEGENERATIVE DISC DISEASE, CERVICAL SPINE (ICD-722.4) 2)  DEGENERATIVE DISC DISEASE, LUMBOSACRAL SPINE (ICD-722.52) 3)  SHOULDER PAIN, LEFT, 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) 4)  IRREGULAR HEART RATE (ICD-427.9) 5)  BRONCHITIS, ACUTE (ICD-466.0) 6)  HYPERTENSION, BENIGN ESSENTIAL (ICD-401.1) 7)  DEPRESSION (ICD-311) 8)  MURMUR (ICD-785.2)   Will enclose reading of most recent L/S MRI    Thank you again for agreeing to see our patient; please contact us if you have any further questions or need additional information.  Sincerely,  Mack Hook MD

## 2010-06-28 NOTE — Progress Notes (Signed)
Summary: MED CALLED TO WRONG PHARM.   Phone Note Call from Patient Call back at [facility] Phone [phone]   Summary of Call: MULBERRY PT. [facility] FOR BENAZAPRIL AND TOPROL WERE CALLED INTO [facility] [address], AND THEY NEED TO BE CALLED INTO [facility] ON [address] BECAUSE SHE DOESN[facility]T USE [facility]. Initial call taken by: Roberto Scales,  September 16, 2008 12:39 PM      Appended Document: MED CALLED TO WRONG PHARM. Nancy--did you call this in?  Received a fax for Toprol as well, but not clear if it was actually done.  Please clarify.  Appended Document: MED CALLED TO WRONG PHARM. Discussed with Izora Gala, this was called to the correct pharmacy and resolved on 09/17/08

## 2010-06-28 NOTE — Letter (Signed)
Summary: *HSN Results Follow up  [place], [place] [address]   Phone: [phone]  Fax: [phone]      11/16/2009   Diane Weisbecker [address] [place], [place]  [address]   Dear  [name],                            ____S.Drinkard,FNP   ____D. Gore,FNP       ____B. McPherson,MD   ____V. Rankins,MD    __X__E. Mulberry,MD    ____N. Hassell Done, FNP  ____D. Jobe Igo, MD    ____K. Tomma Lightning, MD    ____Other     This letter is to inform you that your recent test(s):  _______Pap Smear    ___X____Lab Test     _______X-ray    ___X____ is within acceptable limits  _______ requires a medication change  _______ requires a follow-up lab visit  _______ requires a follow-up visit with your provider   Comments:       _________________________________________________________ If you have any questions, please contact our office                     Sincerely,  Mack Hook MD [facility]

## 2010-06-28 NOTE — Letter (Signed)
Summary: [facility] NOTE  [facility] NOTE   Imported By: Roberto Scales 11/02/2008 16:37:46  _____________________________________________________________________  External Attachment:    Type:   Image     Comment:   External Document

## 2010-06-28 NOTE — Letter (Signed)
Summary: [facility] supplies  [facility]   Imported By: Roland Earl 09/03/2009 11:54:06  _____________________________________________________________________  External Attachment:    Type:   Image     Comment:   External Document

## 2010-06-28 NOTE — Assessment & Plan Note (Signed)
Summary: depression/dm/htn///kt   Vital Signs:  Patient profile:   70 year old female Menstrual status:  hysterectomy Weight:      176.44 pounds Temp:     96.8 degrees F oral BP sitting:   140 / 68  (left arm) Cuff size:   regular  Vitals Entered By: [name], CMA CC: Pt. is here for depression, DM, and HTN. Pt. states her Cardio. has taken her off Toprol in August b/c it was slowing donw her heart rate.  Is Patient Diabetic? Yes Pain Assessment Patient in pain? no      CBG Result 84 CBG Device ID B  Does patient need assistance? Functional Status Self care Ambulation Normal   CC:  Pt. is here for depression, DM, and and HTN. Pt. states her Cardio. has taken her off Toprol in August b/c it was slowing donw her heart rate. [name] Kitchen  History of Present Illness: 1.  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  Dr. Alycia Rossetti.  Still with some mild soreness at surgical side.  Had cardiac cath prior with Dr. Loura Halt was normal.  Beta blocker was stopped secondary to low heart rate.  2.  Hypertension:  BP has been doing okay without Toprol.  3.  DM:  would like referral to podiatry for toenail clipping.  A1C did not result out properly today--plan to redo.  Sugars running low 100s to 160s.  No polyuria or polydipsia.  Did get flu vaccine today already.  Did have Retasure earlier this year.  4.  Depression:  Feeling better now that she knows she does not have herpes.  Following up with Estill Bamberg monthly now.  Continues on Fluoxetine.  Feels better now that she has more definitive information with regards to the vaginal concerns.  No suicidal thoughts.  Joined a church and that has helped.      Current Medications (verified): 1)  Benazepril Hcl 40 Mg  Tabs (Benazepril Hcl) .[name] Kitchen.. 1 Tablet By Mouth Daily For Blood Pressure 2)  Norvasc 10 Mg  Tabs (Amlodipine Besylate) .[name] Kitchen.. 1 Tablet By Mouth Daily For Blood Pressure 3)  Toprol Xl 50 Mg   Tb24 (Metoprolol Succinate) .[name] Kitchen.. 1 Tab By Mouth Daily 4)  Triamterene-Hctz 37.5-25 Mg  Tabs (Triamterene-Hctz) .[name] Kitchen.. 1 Tab By Mouth Q A.m. 5)  Nitroglycerin 0.4 Mg Subl (Nitroglycerin) .... Dissolve One Pill Under Tounge Every 5 Min As Needed Forl Chest Pain Do Not Exceed 3 in 15 Min 6)  Metformin Hcl 500 Mg Tabs (Metformin Hcl) .... One Tablet By Mouth Two Times A Day For Blood Sugar 7)  Glucometer .... Test Sugars Once Daily 8)  Glucometer Test Strips .... Test Once Daily 9)  Lancets  Misc (Lancets) .... Once Daily Testing 10)  Proventil Hfa 108 (90 Base) Mcg/act Aers (Albuterol Sulfate) .... 2 Puffs Every 4 Hours As Needed For Chest Tightness 11)  Aspir-Low 81 Mg Tbec (Aspirin) .[name] Kitchen.. 1 Tab By Mouth Daily With Meal 12)  Ferrous Sulfate 325 (65 Fe) Mg Tabs (Ferrous Sulfate) .[name] Kitchen.. 1 Tab By Mouth Daily--Otc 13)  Valacyclovir Hcl 500 Mg Tabs (Valacyclovir Hcl) .[name] Kitchen.. 1 Tab By Mouth Daily--Dr. Raphael Gibney 14)  Lidocaine Ointment 5 % .... Apply As Needed To Affected Area of Vagina 15)  Fluoxetine Hcl 10 Mg Tabs (Fluoxetine Hcl) .[name] Kitchen.. 1 Tab By Mouth Daily For 7 Days, Then Increase To 2 Tabs By Mouth Daily 16)  Epipen 0.3 Mg/0.23ml Devi (Epinephrine) .... Use As Directed As Needed  If Accidental Exposure and Reaction To Shellfish  Allergies (verified): 1)  ! * Shellfish  Physical Exam  General:  NAD Lungs:  Normal respiratory effort, chest expands symmetrically. Lungs are clear to auscultation, no crackles or wheezes. Heart:  Normal rate and regular rhythm. S1 and S2 normal without gallop, murmur, click, rub or other extra sounds.  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 Alycia Rossetti  Problem # 2:  DIABETES MELLITUS, TYPE II (ICD-250.00) Well controlled Flu vaccine  given Her updated medication list for this problem includes:    Benazepril Hcl 40 Mg Tabs (Benazepril hcl) .[name] Kitchen... 1 tablet by mouth daily for blood pressure    Metformin Hcl 500 Mg Tabs (Metformin hcl) ..... One tablet by mouth two times a day for blood sugar    Aspir-low 81 Mg Tbec (Aspirin) .[name] Kitchen... 1 tab by mouth daily with meal  Orders: Capillary Blood Glucose/CBG (82948) Hgb A1C [id number]) Podiatry Referral (Podiatry)  Complete Medication List: 1)  Benazepril Hcl 40 Mg Tabs (Benazepril hcl) .[name] Kitchen.. 1 tablet by mouth daily for blood pressure 2)  Norvasc 10 Mg Tabs (Amlodipine besylate) .[name] Kitchen.. 1 tablet by mouth daily for blood pressure 3)  Triamterene-hctz 37.5-25 Mg Tabs (Triamterene-hctz) .[name] Kitchen.. 1 tab by mouth q a.m. 4)  Nitroglycerin 0.4 Mg Subl (Nitroglycerin) .... Dissolve one pill under tounge every 5 min as needed forl chest pain do not exceed 3 in 15 min 5)  Metformin Hcl 500 Mg Tabs (Metformin hcl) .... One tablet by mouth two times a day for blood sugar 6)  Glucometer  .... Test sugars once daily 7)  Glucometer Test Strips  .... Test once daily 8)  Lancets Misc (Lancets) .... Once daily testing 9)  Proventil Hfa 108 (90 Base) Mcg/act Aers (Albuterol sulfate) .... 2 puffs every 4 hours as needed for chest tightness 10)  Aspir-low 81 Mg Tbec (Aspirin) .[name] Kitchen.. 1 tab by mouth daily with meal 11)  Ferrous Sulfate 325 (65 Fe) Mg Tabs (Ferrous sulfate) .[name] Kitchen.. 1 tab by mouth daily--otc 12)  Valacyclovir Hcl 500 Mg Tabs (Valacyclovir hcl) .[name] Kitchen.. 1 tab by mouth daily--dr. stringer 13)  Lidocaine Ointment 5 %  .... Apply as needed to affected area of vagina 14)  Fluoxetine Hcl 10 Mg Tabs (Fluoxetine hcl) .[name] Kitchen.. 1 tab by mouth daily for 7 days, then increase to 2 tabs by mouth daily 15)  Epipen 0.3 Mg/0.83ml Devi (Epinephrine) .... Use as directed as needed if accidental exposure and reaction to shellfish  Other Orders: Influenza Vaccine MCR [id number])  Patient Instructions: 1)  Follow up with Dr. Amil Amen  in 4 months --DM, htn   Orders Added: 1)  Capillary Blood Glucose/CBG [82948] 2)  Est. Patient Level III [id number] 3)  Hgb A1C [83036QW] 4)  Podiatry Referral [Podiatry] 5)  Influenza Vaccine MCR [00025]   Immunizations Administered:  Influenza Vaccine # 1:    Vaccine Type: Fluvax MCR    Site: right deltoid    Mfr: GlaxoSmithKline    Dose: 0.5 ml    Route: IM    Given by: Bridgett Larsson RN    Exp. Date: 11/26/2010    Lot #: [id number]    VIS given: 12/21/09 version given April 12, 2010.  Flu Vaccine Consent Questions:    Do you have a history of severe allergic reactions to this vaccine? no    Any prior history of allergic reactions to egg and/or gelatin? no    Do you have a sensitivity to the preservative Thimersol? no    Do you have a past history of Guillan-Barre Syndrome? no    Do you currently have an acute febrile illness? no    Have you ever had a severe reaction to latex? no    Vaccine information given and explained to patient? yes    Are you currently pregnant? no   Immunizations Administered:  Influenza Vaccine # 1:    Vaccine Type: Fluvax MCR    Site: right deltoid    Mfr: GlaxoSmithKline    Dose: 0.5 ml    Route: IM    Given by: Bridgett Larsson RN    Exp. Date: 11/26/2010    Lot #: [id number]    VIS given: 12/21/09 version given April 12, 2010.   Last LDL:                                                 ; (11/08/2009 9:41:28 PM)          Diabetic Foot Exam Foot Inspection Is there a history of a foot ulcer?              No Is there a foot ulcer now?              No Can the patient see the bottom of their feet?          Yes Are the shoes appropriate in style and fit?          Yes Is there swelling or an abnormal foot shape?          No Are the toenails long?                Yes Are the toenails thick?                Yes Are the toenails ingrown?              No Is there heavy callous build-up?              No Is there a claw toe deformity?                           No Is there elevated skin temperature?            No Is there limited ankle dorsiflexion?            No Is there foot or ankle muscle weakness?            Yes Do you have pain in calf while walking?           Yes       High Risk Feet? No   10-g (5.07) Semmes-Weinstein Monofilament Test Performed by: [name], CMA          Right Foot          Left Foot Visual Inspection                 Test Control      normal  normal Site 1         normal         normal Site 2         normal         normal Site 3         normal         normal Site 4         normal         normal Site 5         normal         normal Site 6         normal         normal Site 7         normal         normal Site 8         abnormal         normal Site 9         normal         normal Site 10         abnormal         normal  Impression                normal    Laboratory Results   Blood Tests   Date/Time Received: April 05, 2010 12:40 PM   HGBA1C: 6.0%   (Normal Range: Non-Diabetic - 3-6%   Control Diabetic - 6-8%) CBG Random:: 84

## 2010-06-28 NOTE — Progress Notes (Signed)
   Phone Note Outgoing Call   Summary of Call: Diane [name]--see order for GI consult and letter--may not be near each other--lots of appends on this one.  Will also need CBC and anemia studies sent along.  Thanks Initial call taken by: Mack Hook MD,  September 05, 2009 6:21 PM

## 2010-06-28 NOTE — Letter (Signed)
Summary: *Referral Letter  [facility]  [address] [place], [place] [address]   Phone: [phone]  Fax: [phone]    11/04/2009  Dear Dr. Raphael Gibney,  Thank you for seeing my patient:  Diane [name], [place]  [address]  Phone: [phone]  She followed up with me today for a physical.  She has been taking the Valtrex for about 2 months per her report and has not had much improvement with the inflammed area of her left posterior genital area.  On exam today, I could not say I could see much improvement as well.  She tells me she is seeing you later this month.  I saw in your notes that you had discussed biopsy and I have discussed with Diane [name] that she should really get that done.    Current Medical Problems: 1)  TOBACCO USER (ICD-305.1) 2)  POSTMENOPAUSAL STATUS (ICD-V49.81) 3)  ROUTINE GYNECOLOGICAL EXAMINATION (ICD-V72.31) 4)  DYSLIPIDEMIA (ICD-272.4) 5)  LEUKOPLAKIA OF VAGINA (ICD-623.1) 6)  HAIR LOSS (ICD-704.00) 7)  ANEMIA (ICD-285.9) 8)  SINUSITIS, ACUTE (ICD-461.9) 9)  DIABETES MELLITUS, TYPE II (ICD-250.00) 10)  URINARY TRACT INFECTION SITE NOT SPECIFIED (ICD-599.0) 11)  BREAST PAIN, RIGHT (ICD-611.71) 12)  ANGIOEDEMA (ICD-995.1) 13)  CHEST PAIN, ATYPICAL (ICD-786.59) 14)  ALLERGIC RHINITIS (ICD-477.9) 15)  VAGINITIS (ICD-616.10) 16)  NEED PROPHYLACTIC VACCINATION&INOCULATION FLU (ICD-V04.81) 17)  CAROTID BRUIT, RIGHT (ICD-785.9) 18)  ENCOUNTER FOR LONG-TERM USE OF OTHER MEDICATIONS (ICD-V58.69) 19)  HEALTH MAINTENANCE EXAM (ICD-V70.0) 20)  DEGENERATIVE DISC DISEASE, CERVICAL SPINE (ICD-722.4) 21)  DEGENERATIVE DISC DISEASE, LUMBOSACRAL SPINE (ICD-722.52) 22)  SHOULDER PAIN, LEFT, 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) 23)  IRREGULAR HEART RATE (ICD-427.9) 24)  BRONCHITIS, ACUTE (ICD-466.0) 25)  HYPERTENSION, BENIGN ESSENTIAL (ICD-401.1) 26)  DEPRESSION (ICD-311) 27)  MURMUR (ICD-785.2)   Current Medications: 1)   BENAZEPRIL HCL 40 MG  TABS (BENAZEPRIL HCL) 1 tablet by mouth daily for blood pressure 2)  NORVASC 10 MG  TABS (AMLODIPINE BESYLATE) 1 tablet by mouth daily for blood pressure 3)  TOPROL XL 50 MG  TB24 (METOPROLOL SUCCINATE) 1 tab by mouth daily 4)  TRIAMTERENE-HCTZ 37.5-25 MG  TABS (TRIAMTERENE-HCTZ) 1 tab by mouth q a.m. 5)  NITROGLYCERIN 0.4 MG SUBL (NITROGLYCERIN) dissolve one pill under tounge every 5 min as needed forl chest pain do not exceed 3 in 15 min 6)  METFORMIN HCL 500 MG TABS (METFORMIN HCL) One tablet by mouth two times a day for blood sugar 7)  * GLUCOMETER test sugars once daily 8)  * GLUCOMETER TEST STRIPS test once daily 9)  LANCETS  MISC (LANCETS) once daily testing 10)  PROVENTIL HFA 108 (90 BASE) MCG/ACT AERS (ALBUTEROL SULFATE) 2 puffs every 4 hours as needed for chest tightness 11)  ASPIR-LOW 81 MG TBEC (ASPIRIN) 1 tab by mouth daily with meal 12)  FERROUS SULFATE 325 (65 FE) MG TABS (FERROUS SULFATE) 1 tab by mouth daily--otc 13)  VALACYCLOVIR HCL 500 MG TABS (VALACYCLOVIR HCL) 1 tab by mouth daily--Dr. Raphael Gibney 14)  * LIDOCAINE OINTMENT 5 % Apply as needed to affected area of vagina 15)  FLUOXETINE HCL 10 MG TABS (FLUOXETINE HCL) 1 tab by mouth daily for 7 days, then increase to 2 tabs by mouth daily   Past Medical History: 1)  DEGENERATIVE DISC DISEASE, CERVICAL SPINE (ICD-722.4) 2)  DEGENERATIVE DISC DISEASE, LUMBOSACRAL SPINE (ICD-722.52) 3)  SHOULDER PAIN, LEFT, 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) 4)  IRREGULAR HEART RATE (ICD-427.9) 5)  BRONCHITIS, ACUTE (ICD-466.0) 6)  HYPERTENSION, BENIGN ESSENTIAL (ICD-401.1) 7)  DEPRESSION (ICD-311) 8)  MURMUR (ICD-785.2) 9)  Borderline Diabetes      Thank you again for seeing Diane [name]; please contact us if you have any further questions or need additional information.  Sincerely,  Mack Hook MD

## 2010-06-28 NOTE — Assessment & Plan Note (Signed)
Summary: CPP///KT   Vital Signs:  Patient profile:   70 year old female Menstrual status:  hysterectomy Weight:      177 pounds Temp:     97.4 degrees F Pulse rate:   56 / minute Pulse rhythm:   regular Resp:     18 per minute BP sitting:   116 / 65  (left arm) Cuff size:   large  Vitals Entered By: Shellia Carwin CMA (November 04, 2009 10:57 AM) CC: cpp Is Patient Diabetic? Yes CBG Result 85  Does patient need assistance? Ambulation Normal   CC:  cpp.  History of Present Illness: 71 yo female here for CPP.  Concerns: 1.  Tender Vaginal lesion:  pt. on Valtrex now for 2 months through Dr Raphael Gibney.  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  No biopsy done yet for unknown reason.  To go back to Dr. Raphael Gibney later this month.  Pt. states no better with pain in area.    2.  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 in a tail spin.  Daughter lost her job and now living with her.   No sleeping well.  Does not awakened refreshed.  Takes her a while to get going in the morning.  Poor energy.  Poor apptite--nothing tastes good.  Thought about suicide after diagnosis of herpes--not currently.  Would be interested in counseling.  Feels she has suffered for years with depression.  Treated with Prozac, Zoloft.  Last medication in late 80s, early 90s.  Finding herself tearful.    Habits & Providers  Alcohol-Tobacco-Diet     Alcohol drinks/day: 0     Tobacco Status: current     Cigarette Packs/Day: 0.5     Year Started: age 65  Exercise-Depression-Behavior     Drug Use: never  Allergies (verified): No Known Drug Allergies  Past History:  Past Medical History: Reviewed history from 11/26/2008 and no changes required. 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 1.  1988:  TAH and BSO for fibroid tumors 2.  1974:  BTL 3.  Intermittent corticosteroid injections of low back.  Family History: Mother, died 62:  Dementia, Alzheimer's type, stroke Father, died 22:  MI, alcoholism 4 brothers:  1 brother died age 49, MI.  Other 3 healthy 3 Sisters:  1 sister died age [id number] complications of Down Syndrome.  1 Sister with hypertension.   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 Lives alone.  Retired now  Not currently sexually active. Current Smoker Alcohol:  No Drugs:  No Packs/Day:  0.5 Drug Use:  never  Review of Systems General:  Poor. Eyes:  Wears glasses. Last eye check was Retasure--her Medicare may not cover.. ENT:  Denies decreased hearing. CV:  Saw Dr. Terrence Dupont recently for chest pain--he will perform catheterization if continues to be a problem.  Having twice monthly--can occur at rest of activity.[name] Kitchen Resp:  Some shortness of breath with walks too far or long.[name] Kitchen GI:  Denies abdominal pain, bloody stools, constipation, dark tarry stools, and diarrhea. GU:  Denies discharge and dysuria. MS:  Low back pain:  Dr. Ace Gins  used to give pt. her injections, but left her orthopedic office.  To go to the office soon--sees Dr. Daleen Squibb check to see where they want to send her. Needs to get restarted on injections.. Derm:  Denies lesion(s) and rash. Neuro:  Denies numbness, tingling, and weakness; Only in right leg with radiculopathy.. Psych:  Complains of depression; denies suicidal thoughts/plans.  Physical Exam  General:  Well-developed,well-nourished,in no acute distress; alert,appropriate and cooperative throughout examination Head:   Normocephalic and atraumatic without obvious abnormalities. No apparent alopecia or balding. Eyes:  No corneal or conjunctival inflammation noted. EOMI. Perrla. Funduscopic exam benign, without hemorrhages, exudates or papilledema. Vision grossly normal. Ears:  External ear exam shows no significant lesions or deformities.  Otoscopic examination reveals clear canals, tympanic membranes are intact bilaterally without bulging, retraction, inflammation or discharge. Hearing is grossly normal bilaterally. Nose:  External nasal examination shows no deformity or inflammation. Nasal mucosa are pink and moist without lesions or exudates. Mouth:  hypertrophic bony growth of  palate Neck:  No deformities, masses, or tenderness noted. Breasts:  No mass, nodules, thickening, tenderness, bulging, retraction, inflamation, nipple discharge or skin changes noted.   Lungs:  Normal respiratory effort, chest expands symmetrically. Lungs are clear to auscultation, no crackles or wheezes. Heart:  Normal rate and regular rhythm. S1 and S2 normal without gallop, murmur, click, rub or other extra sounds. Abdomen:  Bowel sounds positive,abdomen soft and non-tender without masses, organomegaly or hernias noted. Rectal:  deferred--just had colonoscopy Genitalia:  External genitalia still with inflammed area at left posterior intoitus with some white plaqueing over.  Does not appear to be definitively ulcerative, but actually elevated from normal surface. Msk:  No deformity or scoliosis noted of thoracic or lumbar spine.   Pulses:  R and L carotid,radial,femoral,dorsalis pedis and posterior tibial pulses are full and equal bilaterally.  No carotid bruits Extremities:  No clubbing, cyanosis, edema, or deformity noted with normal full range of motion of all joints.   Neurologic:  No cranial nerve deficits noted. Station and gait are normal. Plantar reflexes are down-going bilaterally. DTRs are symmetrical throughout. Sensory,  motor and coordinative functions appear intact. Skin:  Intact without suspicious lesions or rashes Cervical Nodes:  No lymphadenopathy noted Axillary Nodes:  No palpable lymphadenopathy Inguinal Nodes:  No significant adenopathy Psych:  Cognition and judgment appear intact. Alert and cooperative with normal attention span and concentration. No apparent delusions, illusions, hallucinations   Impression & Recommendations:  Problem # 1:  ROUTINE GYNECOLOGICAL EXAMINATION (ICD-V72.31) Mammogram already performed DEXA bone density with smoking, poor dairy intake and postmenopausal status Orders: UA Dipstick w/o Micro (manual) (81002)  Problem # 2:  LEUKOPLAKIA OF VAGINA (ICD-623.1) Letter to Dr. Raphael Gibney.  Problem # 3:  DIABETES MELLITUS, TYPE II (ICD-250.00) Controlled Her updated medication list for this problem includes:    Benazepril Hcl 40 Mg Tabs (Benazepril hcl) .[name] Kitchen... 1 tablet by mouth daily for blood pressure    Metformin Hcl 500 Mg Tabs (Metformin hcl) ..... One tablet by mouth two times a day for blood sugar    Aspir-low 81 Mg Tbec (Aspirin) .[name] Kitchen... 1 tab by mouth daily with meal  Orders: Capillary Blood Glucose/CBG [id number]) T-Urine Microalbumin w/creat. ratio ([id number]) Hemoglobin A1C (83036)  Problem # 4:  DYSLIPIDEMIA (ICD-272.4) Not quite at goal,but pt. plans to work on diet and exercise  Problem # 5:  DEPRESSION (ICD-311) Restart Fluoxetine  Her updated medication list for this problem includes:    Fluoxetine Hcl 10 Mg Tabs (  Fluoxetine hcl) .[name] Kitchen... 1 tab by mouth daily for 7 days, then increase to 2 tabs by mouth daily  Orders: Psychology Referral (Psychology)--Amanda Sharol Roussel  Problem # 6:  CHEST PAIN, ATYPICAL (ICD-786.59) Working with Dr. Terrence Dupont on this.  Problem # 7:  HYPERTENSION, BENIGN ESSENTIAL (ICD-401.1) Controlled Her updated medication list for this problem includes:    Benazepril Hcl 40 Mg Tabs (Benazepril hcl) .[name] Kitchen... 1 tablet by mouth daily  for blood pressure    Norvasc 10 Mg Tabs (Amlodipine besylate) .[name] Kitchen... 1 tablet by mouth daily for blood pressure    Toprol Xl 50 Mg Tb24 (Metoprolol succinate) .[name] Kitchen... 1 tab by mouth daily    Triamterene-hctz 37.5-25 Mg Tabs (Triamterene-hctz) .[name] Kitchen... 1 tab by mouth q a.m.  Orders: UA Dipstick w/o Micro (manual) (81002)  Complete Medication List: 1)  Benazepril Hcl 40 Mg Tabs (Benazepril hcl) .[name] Kitchen.. 1 tablet by mouth daily for blood pressure 2)  Norvasc 10 Mg Tabs (Amlodipine besylate) .[name] Kitchen.. 1 tablet by mouth daily for blood pressure 3)  Toprol Xl 50 Mg Tb24 (Metoprolol succinate) .[name] Kitchen.. 1 tab by mouth daily 4)  Triamterene-hctz 37.5-25 Mg Tabs (Triamterene-hctz) .[name] Kitchen.. 1 tab by mouth q a.m. 5)  Nitroglycerin 0.4 Mg Subl (Nitroglycerin) .... Dissolve one pill under tounge every 5 min as needed forl chest pain do not exceed 3 in 15 min 6)  Metformin Hcl 500 Mg Tabs (Metformin hcl) .... One tablet by mouth two times a day for blood sugar 7)  Glucometer  .... Test sugars once daily 8)  Glucometer Test Strips  .... Test once daily 9)  Lancets Misc (Lancets) .... Once daily testing 10)  Proventil Hfa 108 (90 Base) Mcg/act Aers (Albuterol sulfate) .... 2 puffs every 4 hours as needed for chest tightness 11)  Aspir-low 81 Mg Tbec (Aspirin) .[name] Kitchen.. 1 tab by mouth daily with meal 12)  Ferrous Sulfate 325 (65 Fe) Mg Tabs (Ferrous sulfate) .[name] Kitchen.. 1 tab by mouth daily--otc 13)  Valacyclovir Hcl 500 Mg Tabs (Valacyclovir hcl) .[name] Kitchen.. 1 tab by mouth daily--dr. stringer 14)  Lidocaine Ointment 5 %  .... Apply as needed to affected area of vagina 15)  Fluoxetine Hcl 10 Mg Tabs (Fluoxetine hcl) .[name] Kitchen.. 1 tab by mouth daily for 7 days, then increase to 2 tabs by mouth daily  Other Orders: T-CBC w/Diff [id number]) T-Comprehensive Metabolic Panel ([id number]) Dexa scan (Dexa scan) T-Culture, Urine [id number])  Patient Instructions: 1)  Referral to Birdie Hopes 2)  Schedule Retasure 3)  Follow up with Dr. Amil Amen in 1  month --depression  Preventive Care Screening  Colonoscopy:    Date:  09/21/2009    Results:  No polyps, diverticulosis of sigmoid colon and descending colon.  Dr. Amedeo Plenty, [place].  Repeat in 10 years   Prior Values:    Mammogram:  ASSESSMENT: Negative - BI-RADS 1^MM DIGITAL SCREENING (08/20/2009)    Last Tetanus Booster:  Historical (06/05/2006)    Last Flu Shot:  Fluvax 3+ (04/02/2009)    Last Pneumovax:  Pneumovax (Medicare) (03/11/2008)     SBE:  monthly--no changes Guaiac Cards:  just had colonoscopy 2 months ago Osteoprevention:  [place], not very physically active.  Does not take calcium currently  Prescriptions: METFORMIN HCL 500 MG TABS (METFORMIN HCL) One tablet by mouth two times a day for blood sugar  #60 x 11   Entered and Authorized by:   Mack Hook MD   Signed by:   Mack Hook MD on 11/04/2009   Method used:   Electronically to  [facility] [phone]* (retail)       [place], [place]  [address]       Ph: [phone]       Fax: [phone]   RxID:   [id number] NITROGLYCERIN 0.4 MG SUBL (NITROGLYCERIN) dissolve one pill under tounge every 5 min as needed forl chest pain do not exceed 3 in 15 min  #24 x 1   Entered and Authorized by:   Mack Hook MD   Signed by:   Mack Hook MD on 11/04/2009   Method used:   Electronically to        [facility] [phone]* (retail)       [place], [place]  [address]       Ph: [phone]       Fax: [phone]   [id number] TRIAMTERENE-HCTZ 37.5-25 MG  TABS (TRIAMTERENE-HCTZ) 1 tab by mouth q a.m.  #30 Each x 10   Entered and Authorized by:   Mack Hook MD   Signed by:   Mack Hook MD on 11/04/2009   Method used:   Electronically to        [facility] [phone]* (retail)       [place], [place]  [address]       Ph: [phone]       Fax: [phone]   RxID:   [id number] TOPROL  XL 50 MG  TB24 (METOPROLOL SUCCINATE) 1 tab by mouth daily  #30 Each x 10   Entered and Authorized by:   Mack Hook MD   Signed by:   Mack Hook MD on 11/04/2009   Method used:   Electronically to        [facility] [phone]* (retail)       [place], [place]  [address]       Ph: [phone]       Fax: [phone]   [id number] NORVASC 10 MG  TABS (AMLODIPINE BESYLATE) 1 tablet by mouth daily for blood pressure  #30 Each x 10   Entered and Authorized by:   Mack Hook MD   Signed by:   Mack Hook MD on 11/04/2009   Method used:   Electronically to        [facility] [phone]* (retail)       [place], [place]  [address]       Ph: [phone]       Fax: [phone]   [id number] BENAZEPRIL HCL 40 MG  TABS (BENAZEPRIL HCL) 1 tablet by mouth daily for blood pressure  #30 Each x 10   Entered and Authorized by:   Mack Hook MD   Signed by:   Mack Hook MD on 11/04/2009   Method used:   Electronically to        [facility] [phone]* (retail)       [place],   [address]       Ph: [phone]       Fax: [phone]   [id number] FLUOXETINE HCL 10 MG TABS (FLUOXETINE HCL) 1 tab by mouth daily for 7 days, then increase to 2 tabs by mouth daily  #60 x 1  Entered and Authorized by:   Mack Hook MD   Signed by:   Mack Hook MD on 11/04/2009   Method used:   Electronically to        [facility] [phone]* (retail)       [place], [place]  [address]       Ph: [phone]       Fax: [phone]   RxID:   [id number]   Laboratory Results   Urine Tests    Routine Urinalysis   Glucose: negative   (Normal Range: Negative) Bilirubin: negative   (Normal Range: Negative) Ketone: negative   (Normal Range: Negative) Spec. Gravity: <1.005   (Normal Range: 1.003-1.035) Blood: negative    (Normal Range: Negative) pH: 5.5   (Normal Range: 5.0-8.0) Protein: negative   (Normal Range: Negative) Urobilinogen: 0.2   (Normal Range: 0-1) Nitrite: negative   (Normal Range: Negative) Leukocyte Esterace: moderate   (Normal Range: Negative)     Blood Tests     HGBA1C: 6.0%   (Normal Range: Non-Diabetic - 3-6%   Control Diabetic - 6-8%) CBG Random:: 85mg /dL

## 2010-06-28 NOTE — Miscellaneous (Signed)
Summary: [name]  Patient: Diane [name] Note: All result statuses are Final unless otherwise noted.  Tests: (1) [name] (Medications)   LLIMPORTMEDS              "Result Below..."       RESULT: PREMARIN CREA 0.625 MG/GM*INSERT 1 GRAM VAGINALLY AT BEDTIME FOR  2 WEEKS THEN INSERT 1 GRAM VAGINALLY 1-2 TIMES PER WEEK*08/08/2006*Last Refill: 10/19/2006*[id number]*******   LLIMPORTMEDS              "Result Below..."       RESULT: NORVASC TABS 10 MG*TAKE ONE (1) TABLET EACH DAY WITH BENAZEPRIL = LOTREL  GENE HAS ICP NORVASC 10MG  0708*01/21/2007*Last Refill: 02/22/2007*[id number]*******   LLIMPORTMEDS              "Result Below..."       RESULT: METOPROLOL TARTRATE TABS 50 MG*TAKE ONE-HALF TABLET DAILY*01/21/2007*Last Refill: 02/22/2007*[id number]*******   LLIMPORTMEDS              "Result Below..."       RESULT: DICLOFENAC SODIUM TBEC 75 MG*TAKE ONE TABLET TWICE DAILY AS NEEDED  FOR SHOULDER PAIN*11/20/2006*Last Refill: [id number]*******   LLIMPORTMEDS              "Result Below..."       RESULT: CYCLOBENZAPRINE HCL TABS 10 MG*TAKE ONE (1) TABLET AT BEDTIME   Generic for FLEXERIL 10MG  TAB*03/26/2007*Last Refill: [id number]*******   LLIMPORTMEDS              "Result Below..."       RESULT: BENAZEPRIL HCL TABS 40 MG*TAKE ONE (1) TABLET EACH DAY WITH NORVASC = LOTREL*01/09/2007*Last Refill: [email or web address]*******   LLIMPORTALLS              "Result Below..."       RESULT: SHELLFISH-DERIVED PRODUCTS***  Note: An exclamation mark (!) indicates a result that was not dispersed into the flowsheet. Document Creation Date: 03/28/2007 3:05 PM _______________________________________________________________________  (1) Order result status: Final Collection or observation date-time: 02/13/2007 Requested date-time: 02/13/2007 Receipt date-time:  Reported date-time: 02/13/2007 Referring Physician:   Ordering Physician:   Specimen Source:  Source: Charlyne Mom Order Number:  Lab site:

## 2010-06-28 NOTE — Assessment & Plan Note (Signed)
Summary: RENEW MEDS/LEG AND HIP//KT  Medications Added CYCLOBENZAPRINE HCL 10 MG  TABS (CYCLOBENZAPRINE HCL) 1 tab by mouth q hs as needed pain TOPROL XL 50 MG  TB24 (METOPROLOL SUCCINATE) 1 tab by mouth daily WELLBUTRIN XL 150 MG  TB24 (BUPROPION HCL) 1 tab by mouth daily for 3 days, then 2 tabs by mouth daily thereafter        Vital Signs:  Patient Profile:   70 Years Old Female Weight:      195 pounds Temp:     97.6 degrees F Pulse rate:   80 / minute Pulse rhythm:   regular Resp:     18 per minute BP sitting:   142 / 90  (left arm)  Pt. in pain?   yes    Location:   lt sholder,rt leg    Intensity:   5  Vitals Entered By: Shellia Carwin CMA (August 09, 2007 11:33 AM)              Is Patient Diabetic? No  Does patient need assistance? Ambulation Normal     Chief Complaint:  f/u pain doesn't think the pain meds are helping.  Does not have flexeril.[name] Kitchen  History of Present Illness: 1.  Low back pain with worsening pain into right leg.  If sits or walks too long, has to prop up right leg to ease pain  2.  Left shoulder pain:  Neck--all the way down left arm.  Feels she is losing strength in left hand--difficulties opening things with that hand.  Called Y--no longer with water aerobic/therapy classes any longer.  No numbness or tingling.  3.  Hypertension:  Taking all three meds regularly--has had high bp checks recently at [facility] as well.  4.  Tobacco Abuse:  Smoking 3 PP week.  Has never tried anything to quit smoking in past.  Tried cold [place] in past month --didn't even go a day.    Current Allergies: No known allergies       Physical Exam  General:     limps to exam table--favoring right leg Lungs:     Normal respiratory effort, chest expands symmetrically. Lungs are clear to auscultation, no crackles or wheezes. Heart:     Normal rate and regular rhythm. S1 and S2 normal without gallop, murmur, click, rub or other extra sounds.  Radial pulses normal and  equal Msk:     Discomfort above 90 degress abduction and flexion of left shoulder--active and passive, though can get to full ROM.   Tender across trap to shoulder and AC/CC joint as well as subacromial bursa.  Decreased resistance to downward force when abducted 90 degrees and internally rotated.  some tenderness over C spine and L spinous processes.  Some right paraspinous muscle tenderness. Neurologic:     unable to elicit ankles bilaterally however.alert & oriented X3, strength normal in all extremities, and DTRs symmetrical and normal.      Impression & Recommendations:  Problem # 1:  SHOULDER PAIN, LEFT, 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) If no definite impingement of left shoulder, consider injection at follow up visit. Her updated medication list for this problem includes:    Cyclobenzaprine Hcl 10 Mg Tabs (Cyclobenzaprine hcl) .[name] Kitchen... 1 tab by mouth q hs as needed pain    Darvocet-n 100 100-650 Mg Tabs (Propoxyphene n-apap) .[name] Kitchen... 1 tablet by mouth three times a day as needed for pain    Feldene 20 Mg Caps (Piroxicam) .[name] Kitchen... 1 tablet by mouth daily for neck/arm pain  Orders:  Diagnostic X-Ray/Fluoroscopy (Diagnostic X-Ray/Flu)--Left shoulder  Orders: Diagnostic X-Ray/Fluoroscopy (Diagnostic X-Ray/Flu)   Problem # 2:  DEGENERATIVE DISC DISEASE, LUMBOSACRAL SPINE (ICD-722.52) Restart Flexeril Restart Darvocet. Consider PT--has not had in some time  Problem # 3:  TOBACCO ABUSE (ICD-305.1) Well butrin XL 150 mg daily for 3 days, then increase to 300 mg. Stop smoking as discussed Follow up in 1 month.  Problem # 4:  HYPERTENSION, BENIGN ESSENTIAL (ICD-401.1) Not controlled -- increase Toprol to 50 mg daily Follow up 1 month The following medications were removed from the medication list:    Toprol Xl 25 Mg Tb24 (Metoprolol succinate) .[name] Kitchen... 1/2 tablet by mouth two times a day for blood pressure  Her updated medication list for this problem includes:    Benazepril Hcl 40 Mg Tabs (Benazepril  hcl) .[name] Kitchen... 1 tablet by mouth daily for blood pressure    Norvasc 10 Mg Tabs (Amlodipine besylate) .[name] Kitchen... 1 tablet by mouth daily for blood pressure    Toprol Xl 50 Mg Tb24 (Metoprolol succinate) .[name] Kitchen... 1 tab by mouth daily   Complete Medication List: 1)  Cyclobenzaprine Hcl 10 Mg Tabs (Cyclobenzaprine hcl) .[name] Kitchen.. 1 tab by mouth q hs as needed pain 2)  Darvocet-n 100 100-650 Mg Tabs (Propoxyphene n-apap) .[name] Kitchen.. 1 tablet by mouth three times a day as needed for pain 3)  Feldene 20 Mg Caps (Piroxicam) .[name] Kitchen.. 1 tablet by mouth daily for neck/arm pain 4)  Benazepril Hcl 40 Mg Tabs (Benazepril hcl) .[name] Kitchen.. 1 tablet by mouth daily for blood pressure 5)  Norvasc 10 Mg Tabs (Amlodipine besylate) .[name] Kitchen.. 1 tablet by mouth daily for blood pressure 6)  Toprol Xl 50 Mg Tb24 (Metoprolol succinate) .[name] Kitchen.. 1 tab by mouth daily 7)  Wellbutrin Xl 150 Mg Tb24 (Bupropion hcl) .[name] Kitchen.. 1 tab by mouth daily for 3 days, then 2 tabs by mouth daily thereafter   Patient Instructions: 1)  Take Wellbutrin once daily for 3 days, then increase to 2 tabs once daily thereafter.  On the day you start, you must pick a day to stop smoking between days 8-14 after starting med.  Get the cigarettes out of your house. 2)  Please schedule a follow-up appointment in 1 month. Dr. Lurlean Nanny for appt.    Prescriptions: WELLBUTRIN XL 150 MG  TB24 (BUPROPION HCL) 1 tab by mouth daily for 3 days, then 2 tabs by mouth daily thereafter  #60 x 1   Entered and Authorized by:   Mack Hook MD   Signed by:   Mack Hook MD on 08/09/2007   Method used:   Print then Give to Patient   RxID:   [id number] TOPROL XL 50 MG  TB24 (METOPROLOL SUCCINATE) 1 tab by mouth daily  #30 x 5   Entered and Authorized by:   Mack Hook MD   Signed by:   Mack Hook MD on 08/09/2007   Method used:   Print then Give to Patient   RxID:   [id number] DARVOCET-N 100 100-650 MG  TABS (PROPOXYPHENE N-APAP) 1 tablet by mouth three times a  day as needed for pain  #90 x 0   Entered and Authorized by:   Mack Hook MD   Signed by:   Mack Hook MD on 08/09/2007   Method used:   Print then Give to Patient   RxID:   [id number] CYCLOBENZAPRINE HCL 10 MG  TABS (CYCLOBENZAPRINE HCL) 1 tab by mouth q hs as needed pain  #30 x 1   Entered and Authorized by:  Mack Hook MD   Signed by:   Mack Hook MD on 08/09/2007   Method used:   Print then Give to Patient   RxID:   [id number]  ]

## 2010-06-30 NOTE — Letter (Signed)
Summary: PSYCHOLOGY REFERRAL SUMMARY  PSYCHOLOGY REFERRAL SUMMARY   Imported By: Roland Earl 06/06/2010 10:35:03  _____________________________________________________________________  External Attachment:    Type:   Image     Comment:   External Document

## 2010-07-15 ENCOUNTER — Other Ambulatory Visit (HOSPITAL_COMMUNITY): Payer: Self-pay | Admitting: Internal Medicine

## 2010-07-15 DIAGNOSIS — Z1231 Encounter for screening mammogram for malignant neoplasm of breast: Secondary | ICD-10-CM

## 2010-08-03 ENCOUNTER — Ambulatory Visit: Payer: Medicare Other | Attending: Gynecologic Oncology | Admitting: Gynecologic Oncology

## 2010-08-03 DIAGNOSIS — C519 Malignant neoplasm of vulva, unspecified: Secondary | ICD-10-CM | POA: Insufficient documentation

## 2010-08-03 DIAGNOSIS — Z79899 Other long term (current) drug therapy: Secondary | ICD-10-CM | POA: Insufficient documentation

## 2010-08-11 LAB — CBC
HCT: 27.4 % — ABNORMAL LOW (ref 36.0–46.0)
HCT: 35.2 % — ABNORMAL LOW (ref 36.0–46.0)
Hemoglobin: 11.7 g/dL — ABNORMAL LOW (ref 12.0–15.0)
Hemoglobin: 9.4 g/dL — ABNORMAL LOW (ref 12.0–15.0)
MCH: 28.7 pg (ref 26.0–34.0)
MCH: 29.6 pg (ref 26.0–34.0)
MCHC: 33.3 g/dL (ref 30.0–36.0)
MCHC: 34.2 g/dL (ref 30.0–36.0)
MCV: 86 fL (ref 78.0–100.0)
MCV: 86.7 fL (ref 78.0–100.0)
Platelets: 158 10*3/uL (ref 150–400)
Platelets: 248 10*3/uL (ref 150–400)
RBC: 3.16 MIL/uL — ABNORMAL LOW (ref 3.87–5.11)
RBC: 4.09 MIL/uL (ref 3.87–5.11)
RDW: 13.5 % (ref 11.5–15.5)
RDW: 14.8 % (ref 11.5–15.5)
WBC: 6.1 10*3/uL (ref 4.0–10.5)
WBC: 8 10*3/uL (ref 4.0–10.5)

## 2010-08-11 LAB — GLUCOSE, CAPILLARY
Glucose-Capillary: 113 mg/dL — ABNORMAL HIGH (ref 70–99)
Glucose-Capillary: 116 mg/dL — ABNORMAL HIGH (ref 70–99)
Glucose-Capillary: 131 mg/dL — ABNORMAL HIGH (ref 70–99)
Glucose-Capillary: 140 mg/dL — ABNORMAL HIGH (ref 70–99)
Glucose-Capillary: 170 mg/dL — ABNORMAL HIGH (ref 70–99)
Glucose-Capillary: 98 mg/dL (ref 70–99)

## 2010-08-11 LAB — DIFFERENTIAL
Basophils Absolute: 0 10*3/uL (ref 0.0–0.1)
Basophils Relative: 1 % (ref 0–1)
Eosinophils Absolute: 0.1 10*3/uL (ref 0.0–0.7)
Eosinophils Relative: 2 % (ref 0–5)
Lymphocytes Relative: 43 % (ref 12–46)
Lymphs Abs: 2.6 10*3/uL (ref 0.7–4.0)
Monocytes Absolute: 0.5 10*3/uL (ref 0.1–1.0)
Monocytes Relative: 9 % (ref 3–12)
Neutro Abs: 2.8 10*3/uL (ref 1.7–7.7)
Neutrophils Relative %: 46 % (ref 43–77)

## 2010-08-11 LAB — COMPREHENSIVE METABOLIC PANEL
ALT: 12 U/L (ref 0–35)
AST: 17 U/L (ref 0–37)
Albumin: 3.9 g/dL (ref 3.5–5.2)
Alkaline Phosphatase: 61 U/L (ref 39–117)
BUN: 26 mg/dL — ABNORMAL HIGH (ref 6–23)
CO2: 26 mEq/L (ref 19–32)
Calcium: 9.6 mg/dL (ref 8.4–10.5)
Chloride: 104 mEq/L (ref 96–112)
Creatinine, Ser: 1.26 mg/dL — ABNORMAL HIGH (ref 0.4–1.2)
GFR calc Af Amer: 51 mL/min — ABNORMAL LOW (ref >60)
GFR calc non Af Amer: 42 mL/min — ABNORMAL LOW (ref >60)
Glucose, Bld: 115 mg/dL — ABNORMAL HIGH (ref 70–99)
Potassium: 3.6 mEq/L (ref 3.5–5.1)
Sodium: 139 mEq/L (ref 135–145)
Total Bilirubin: 0.5 mg/dL (ref 0.3–1.2)
Total Protein: 7.4 g/dL (ref 6.0–8.3)

## 2010-08-11 LAB — BASIC METABOLIC PANEL
BUN: 14 mg/dL (ref 6–23)
CO2: 24 mEq/L (ref 19–32)
Calcium: 7.8 mg/dL — ABNORMAL LOW (ref 8.4–10.5)
Chloride: 108 mEq/L (ref 96–112)
Creatinine, Ser: 1.17 mg/dL (ref 0.4–1.2)
GFR calc Af Amer: 56 mL/min — ABNORMAL LOW (ref >60)
GFR calc non Af Amer: 46 mL/min — ABNORMAL LOW (ref >60)
Glucose, Bld: 123 mg/dL — ABNORMAL HIGH (ref 70–99)
Potassium: 4 mEq/L (ref 3.5–5.1)
Sodium: 138 mEq/L (ref 135–145)

## 2010-08-11 LAB — SURGICAL PCR SCREEN
MRSA, PCR: NEGATIVE
Staphylococcus aureus: NEGATIVE

## 2010-08-12 LAB — DIFFERENTIAL
Basophils Absolute: 0 10*3/uL (ref 0.0–0.1)
Basophils Relative: 1 % (ref 0–1)
Eosinophils Absolute: 0.1 10*3/uL (ref 0.0–0.7)
Eosinophils Relative: 2 % (ref 0–5)
Lymphocytes Relative: 45 % (ref 12–46)
Lymphs Abs: 2.9 10*3/uL (ref 0.7–4.0)
Monocytes Absolute: 0.5 10*3/uL (ref 0.1–1.0)
Monocytes Relative: 8 % (ref 3–12)
Neutro Abs: 2.9 10*3/uL (ref 1.7–7.7)
Neutrophils Relative %: 45 % (ref 43–77)

## 2010-08-12 LAB — CBC
HCT: 34.5 % — ABNORMAL LOW (ref 36.0–46.0)
Hemoglobin: 11.5 g/dL — ABNORMAL LOW (ref 12.0–15.0)
MCH: 28.9 pg (ref 26.0–34.0)
MCHC: 33.3 g/dL (ref 30.0–36.0)
MCV: 86.7 fL (ref 78.0–100.0)
Platelets: 210 10*3/uL (ref 150–400)
RBC: 3.97 MIL/uL (ref 3.87–5.11)
RDW: 15.4 % (ref 11.5–15.5)
WBC: 6.4 10*3/uL (ref 4.0–10.5)

## 2010-08-12 LAB — GLUCOSE, CAPILLARY
Glucose-Capillary: 103 mg/dL — ABNORMAL HIGH (ref 70–99)
Glucose-Capillary: 93 mg/dL (ref 70–99)

## 2010-08-12 LAB — BASIC METABOLIC PANEL
BUN: 16 mg/dL (ref 6–23)
CO2: 24 mEq/L (ref 19–32)
Calcium: 9.1 mg/dL (ref 8.4–10.5)
Chloride: 108 mEq/L (ref 96–112)
Creatinine, Ser: 1.19 mg/dL (ref 0.4–1.2)
GFR calc Af Amer: 55 mL/min — ABNORMAL LOW (ref >60)
GFR calc non Af Amer: 45 mL/min — ABNORMAL LOW (ref >60)
Glucose, Bld: 109 mg/dL — ABNORMAL HIGH (ref 70–99)
Potassium: 3.7 mEq/L (ref 3.5–5.1)
Sodium: 139 mEq/L (ref 135–145)

## 2010-08-12 LAB — SURGICAL PCR SCREEN
MRSA, PCR: NEGATIVE
Staphylococcus aureus: NEGATIVE

## 2010-08-16 ENCOUNTER — Telehealth (INDEPENDENT_AMBULATORY_CARE_PROVIDER_SITE_OTHER): Payer: Self-pay | Admitting: Internal Medicine

## 2010-08-19 NOTE — Consult Note (Signed)
NAME:  Diane [name], Diane [name]           ACCOUNT NO.:  [id number]  MEDICAL RECORD NO.:  [id number]           PATIENT TYPE:  LOCATION:                                 FACILITY:  PHYSICIAN:  Paola A. Alycia Rossetti, MD    DATE OF BIRTH:  1940-11-20  DATE OF CONSULTATION:  08/03/2010 DATE OF DISCHARGE:                                CONSULTATION   Ms. Guardian is a very pleasant 70 year old who had high-grade lesion in the vulva and had a biopsy revealed CIN III.  She had a wide local excision in August 2011 that revealed an invasive moderately differentiated squamous cell carcinoma, 2 cm in length and 2 mm in depth.  There was no obvious sty.  She subsequently underwent bilateral groin dissection and re-excision of the vulva on September 13, and 7 lymph nodes on each side were negative for cancer.  Within the vulva, there was residual squamous cell carcinoma in situ with no stromal invasion or obvious sty.  The margins were negative.  She comes in today for followup.  She is overall really doing quite well.  She has not felt any lesions on her bottom whatsoever.  REVIEW OF SYSTEMS:  She has complain of some hot flashes over the past few months, it has gotten actually worse.  She took hormone replacement therapy for many years but stopped it about 6 years ago.  She states the vasomotor symptoms she is having now feels very much like the ones then and she would like to consider going back on hormone replacement therapy for a period of time.  She does complain of some difficulty sleeping at night.  She states that she falls asleep at 3 or 4 o'clock in the morning, wakes up at 7 or 8 o'clock in the morning.  She does feel well rested during the day.  She does not take naps during the day.  She states that nothing is really worrying her.  She does take her Wellbutrin before going to sleep at night.  She does watch TV until she goes to bed at night and many times that is what keeps her up, but she  states she has done that for years and the difficulty sleeping as more of a recent tissue.  She does occasionally take some medicine for her leg pain which sometimes is an issue at night.  That does make her somewhat sleepy and rather than starting her on another medication, she may use that at night before going to sleep to see if helps.  She denies any change in bowel or bladder habits, any nausea, vomiting, fevers, chills, headaches or visual changes.  Her weight has gone up 7 pounds since her last visit.  She admits to eating poorly and is going to be making more concerted efforts secondary to her diabetes to have a better p.o. intake.  HEALTH MAINTENANCE:  She is up-to-date on her mammograms.  ALLERGIES:  SHELLFISH.  MEDICATIONS:  Benazepril 40 mg daily, Toprol XL 50 mg daily, Dyazide 37.5/25 mg daily, Norvasc 10 mg daily, Wellbutrin.  PHYSICAL EXAMINATION:  VITAL SIGNS:  Height 5 feet 7 inches, weight 184 pounds  for a BMI of 29.  Blood pressure 142/62, pulse 80, respirations 18, temperature 97.9. GENERAL:  Well-nourished, well-developed female in no acute distress. NECK:  Supple.  There is no lymphadenopathy, no thyromegaly. LUNGS:  Clear to auscultation bilaterally. CARDIOVASCULAR:  Regular rate and rhythm. ABDOMEN:  Soft, nontender, nondistended.  There are no palpable masses or hepatosplenomegaly.  Groins are negative for adenopathy.  Her groin incisions are well-healed. EXTREMITIES:  There is no edema. PELVIC:  External genitalia is notable for surgical excision of the vulva.  There is no gross visible lesions.  Bimanual examination reveals no masses or nodularity.  Rectal confirms.  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  PLAN: 1. Return to see [place] in 3 months. 2. She was given a prescription for estradiol 0.5 mg p.o. daily.     Risks and benefits of this were discussed.   She is well aware of     them and she wishes to try that for this period time. 3. She will see Dr. Amil Amen next week and will talk about the     sleeping issues but for the next couple of days, she might try her     pain medicine at night to see if that helps her sleep.     Paola A. Alycia Rossetti, MD     PAG/MEDQ  D:  08/03/2010  T:  08/03/2010  Job:  [id number]  cc:   Marcelino Duster, M.D.  Caswell Corwin, R.N. [address] [place], [place] [address]   Eli Hose, M.D. [phone]  Electronically Signed by Nancy Marus MD on 08/04/2010 12:12:50 PM

## 2010-08-22 ENCOUNTER — Ambulatory Visit (HOSPITAL_COMMUNITY)
Admission: RE | Admit: 2010-08-22 | Discharge: 2010-08-22 | Disposition: A | Discharge status: Home / Self Care | Payer: Medicare Other | Source: Ambulatory Visit | Attending: Internal Medicine | Admitting: Internal Medicine

## 2010-08-22 DIAGNOSIS — Z1231 Encounter for screening mammogram for malignant neoplasm of breast: Secondary | ICD-10-CM | POA: Insufficient documentation

## 2010-08-26 ENCOUNTER — Encounter (INDEPENDENT_AMBULATORY_CARE_PROVIDER_SITE_OTHER): Payer: Self-pay | Admitting: Internal Medicine

## 2010-08-26 ENCOUNTER — Encounter: Payer: Self-pay | Admitting: Internal Medicine

## 2010-08-26 DIAGNOSIS — R51 Headache: Secondary | ICD-10-CM | POA: Insufficient documentation

## 2010-08-26 DIAGNOSIS — G4700 Insomnia, unspecified: Secondary | ICD-10-CM | POA: Insufficient documentation

## 2010-08-26 DIAGNOSIS — R519 Headache, unspecified: Secondary | ICD-10-CM | POA: Insufficient documentation

## 2010-08-26 LAB — CONVERTED CEMR LAB
Blood Glucose, Fingerstick: 99
Hgb A1c MFr Bld: 5.9 %
Sed Rate: 14 mm/hr (ref 0–22)

## 2010-08-30 NOTE — Progress Notes (Signed)
Summary: BLOODSUGARS ELEVATED/HEADACHES  Phone Note Call from Patient   Reason for Call: Refill Medication Summary of Call: MULBERRY PT. MS Marchant STOPPED BY TO CHECK ON HER APPOINTMENT AND ALSO TO SAY THAT HER BLOODSUGARS HAVE BEEN ELEVATED, UP AND DOWN SINCE LAST WEEK, AND SHE HAS BEEN HAVING HEADACHES. Initial call taken by: Roberto Scales,  August 16, 2010 11:07 AM  Follow-up for Phone Call        Left message on answering machine for pt. to return call.  Sherian Maroon RN  August 17, 2010 5:23 PM  Still getting headaches, pressure behind eyes, been having for awhile, was put on benazepril 40 mg. 1/2 tab at night by cardiologist, Dr. Terrence Dupont.  Went to oncologist, was put on Estradiol 0.5 daily for hot flashes.  Blood sugar is labile, is keeping chart for cardiologist.  Just wanted to let Dr. Amil Amen know.  She can't stand headaches, is concerned.  F/U appt. 08/26/10.  Wants to know if she needs Retasure appt. for this year. Follow-up by: Sherian Maroon RN,  August 18, 2010 9:13 AM  Additional Follow-up for Phone Call Additional follow up Details #1::        Will discuss Retasure at her appt. Can she bring in record of  blood sugars to her visit next week?   Is she having itchy, runny nose, drainage down her throat, cough, itchy throat, sneezing, watery, itchy eyes, along with headache? Additional Follow-up by: Mack Hook MD,  August 18, 2010 11:17 AM    Additional Follow-up for Phone Call Additional follow up Details #2::    No itching, watery eyes, cough, nasal D/C, post nasal drip, sneezing Only has daily HA's on the left temporal area. She checks her BP wkly. @ Dr. Zenia Resides office and it has been fine since she started the medication he prescribed.  Is scheduled to go there tomorrow for BP check. She is taking all of her medications regularly. HA has been present since she last saw Dr. Amil Amen. She has been taking her pain medication at night so she is able to get a few hours of  sleep. Advised If headache gets worse needs to go to the ER [facility] RN  August 18, 2010 3:51 PM

## 2010-08-30 NOTE — Assessment & Plan Note (Signed)
Summary: 3 month f/u on DM and HTN   Vital Signs:  Patient profile:   70 year old female Menstrual status:  hysterectomy Weight:      185.31 pounds BMI:     29.35 Temp:     98.1 degrees F oral Pulse rate:   64 / minute Pulse rhythm:   regular Resp:     20 per minute BP sitting:   133 / 55  (left arm) Cuff size:   regular  Vitals Entered By: Aquilla Solian CMA (August 26, 2010 10:39 AM) CC: 4 month f/u on DM and HTN per visit in 04/06/11.  Is Patient Diabetic? Yes Pain Assessment Patient in pain? no      CBG Result 99 CBG Device ID N fasting   Does patient need assistance? Functional Status Self care Ambulation Normal   CC:  4 month f/u on DM and HTN per visit in 04/06/11. [name] Kitchen  History of Present Illness: 1.  Headache:  Has been having for about 6 months.  Pain is right above left eye.  Describes as a throbbing, but not severe--just annoying.  Awakens with it 3-4 times weekly--no change from when started.  Can last all day, but usually does not.  Generally, gone within an hour or 2.  Was given unknown pain medication for pain in right thigh by Dr. Garnett Farm, but does make her sleepy--has used intermittenly--stops the headache in about 30 minutes.   If has a headache all day--stays in bed.  Usually, can continue to do her usual routine.   No definite associated visual changes.  No photophobia, possibly some phonophobia.  No nausea or vomiting.   Not sleeping well.  Tossing and turning.  Does have stress--daughter still living with her.  Daughter in and out to smoke at night.  Watches TV in room if cannot fall asleep. Goes to bed at 11 pm.  Dozes at around 5 a.m. and then up at 7:30. No jaw claudication or shoulder girdle stiffness--does have chronic neck complaints  2.  DM:    Sugars in 100s this past week, despite some increased vacillation.    Current Medications (verified): 1)  Benazepril Hcl 40 Mg  Tabs (Benazepril Hcl) .[name] Kitchen.. 1 Tablet By Mouth Daily For Blood  Pressure 2)  Norvasc 10 Mg  Tabs (Amlodipine Besylate) .[name] Kitchen.. 1 Tablet By Mouth Daily For Blood Pressure 3)  Triamterene-Hctz 37.5-25 Mg  Tabs (Triamterene-Hctz) .[name] Kitchen.. 1 Tab By Mouth Q A.m. 4)  Nitroglycerin 0.4 Mg Subl (Nitroglycerin) .... Dissolve One Pill Under Tounge Every 5 Min As Needed Forl Chest Pain Do Not Exceed 3 in 15 Min 5)  Metformin Hcl 500 Mg Tabs (Metformin Hcl) .... One Tablet By Mouth Two Times A Day For Blood Sugar 6)  Glucometer .... Test Sugars Once Daily 7)  Glucometer Test Strips .... Test Once Daily 8)  Lancets  Misc (Lancets) .... Once Daily Testing 9)  Proventil Hfa 108 (90 Base) Mcg/act Aers (Albuterol Sulfate) .... 2 Puffs Every 4 Hours As Needed For Chest Tightness 10)  Aspir-Low 81 Mg Tbec (Aspirin) .[name] Kitchen.. 1 Tab By Mouth Daily With Meal 11)  Ferrous Sulfate 325 (65 Fe) Mg Tabs (Ferrous Sulfate) .[name] Kitchen.. 1 Tab By Mouth Daily--Otc 12)  Valacyclovir Hcl 500 Mg Tabs (Valacyclovir Hcl) .[name] Kitchen.. 1 Tab By Mouth Daily--Dr. Raphael Gibney 13)  Lidocaine Ointment 5 % .... Apply As Needed To Affected Area of Vagina 14)  Fluoxetine Hcl 10 Mg Tabs (Fluoxetine Hcl) .[name] Kitchen.. 1 Tab By Mouth Daily For 7  Days, Then Increase To 2 Tabs By Mouth Daily 15)  Epipen 0.3 Mg/0.85ml Devi (Epinephrine) .... Use As Directed As Needed If Accidental Exposure and Reaction To Shellfish  Allergies (verified): 1)  ! * Shellfish  Physical Exam  General:  NAD Head:  Tender over left frontal head and temple area.  No thickening of temporal artery bilaterally--NT with normal pulses Eyes:  No corneal or conjunctival inflammation noted. EOMI. Perrla. Funduscopic exam benign, without hemorrhages, exudates or papilledema. Vision grossly normal. Ears:  External ear exam shows no significant lesions or deformities.  Otoscopic examination reveals clear canals, tympanic membranes are intact bilaterally without bulging, retraction, inflammation or discharge. Hearing is grossly normal bilaterally. Mouth:  pharynx pink and moist.    Neck:  No deformities, masses, or tenderness noted.  Traps twice Lungs:  Normal respiratory effort, chest expands symmetrically. Lungs are clear to auscultation, no crackles or wheezes. Heart:  Normal rate and regular rhythm. S1 and S2 normal without gallop, murmur, click, rub or other extra sounds. Neurologic:  alert & oriented X3 and cranial nerves II-XII intact.     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  Her updated medication list for this problem includes:    Aspir-low 81 Mg Tbec (Aspirin) .[name] Kitchen... 1 tab by mouth daily with meal  Orders: T-Sed Rate (Automated) [id number])  Problem # 2:  INSOMNIA (ICD-780.52) Long discussion regarding poor sleep habits Good Sleep hygiene schedule discussed  Problem # 3:  DIABETES MELLITUS, TYPE II (ICD-250.00) Currently well controlled To eye physician for diabetic eye check Her updated medication list for this problem includes:    Benazepril Hcl 40 Mg Tabs (Benazepril hcl) .[name] Kitchen... 1 tablet by mouth daily for blood pressure    Metformin Hcl 500 Mg Tabs (Metformin hcl) ..... One tablet by mouth two times a day for blood sugar    Aspir-low 81 Mg Tbec (Aspirin) .[name] Kitchen... 1 tab by mouth daily with meal  Orders: Capillary Blood Glucose/CBG [id number]) Ophthalmology Referral (Ophthalmology)  Complete Medication List: 1)  Benazepril Hcl 40 Mg Tabs (Benazepril hcl) .[name] Kitchen.. 1 tablet by mouth daily for blood pressure 2)  Norvasc 10 Mg Tabs (Amlodipine besylate) .[name] Kitchen.. 1 tablet by mouth daily for blood pressure 3)  Triamterene-hctz 37.5-25 Mg Tabs (Triamterene-hctz) .[name] Kitchen.. 1 tab by mouth q a.m. 4)  Nitroglycerin 0.4 Mg Subl (Nitroglycerin) .... Dissolve one pill under tounge every 5 min as needed forl chest pain do not exceed 3 in 15 min 5)  Metformin Hcl 500 Mg Tabs (Metformin hcl) .... One tablet by mouth two times  a day for blood sugar 6)  Glucometer  .... Test sugars once daily 7)  Glucometer Test Strips  .... Test once daily 8)  Lancets Misc (Lancets) .... Once daily testing 9)  Proventil Hfa 108 (90 Base) Mcg/act Aers (Albuterol sulfate) .... 2 puffs every 4 hours as needed for chest tightness 10)  Aspir-low 81 Mg Tbec (Aspirin) .[name] Kitchen.. 1 tab by mouth daily with meal 11)  Ferrous Sulfate 325 (65 Fe) Mg Tabs (Ferrous sulfate) .[name] Kitchen.. 1 tab by mouth daily--otc 12)  Valacyclovir Hcl 500 Mg Tabs (Valacyclovir hcl) .[name] Kitchen.. 1 tab by mouth daily--dr. stringer 13)  Lidocaine Ointment 5 %  .... Apply as needed to affected area of vagina 14)  Fluoxetine Hcl 10 Mg Tabs (Fluoxetine hcl) .[name] Kitchen.. 1 tab by mouth daily for 7 days, then  increase to 2 tabs by mouth daily 15)  Epipen 0.3 Mg/0.38ml Devi (Epinephrine) .... Use as directed as needed if accidental exposure and reaction to shellfish 16)  Trazodone Hcl 50 Mg Tabs (Trazodone hcl) .[name] Kitchen.. 1 tab by mouth at bedtime  Patient Instructions: 1)  Follow up with Dr. Amil Amen in 6 weeks--headaches and insomnia 2)  Take Fluoxetine in the morning Prescriptions: TRAZODONE HCL 50 MG TABS (TRAZODONE HCL) 1 tab by mouth at bedtime  #30 x 11   Entered and Authorized by:   Mack Hook MD   Signed by:   Mack Hook MD on 08/26/2010   Method used:   Electronically to        [facility] [phone]* (retail)       [address]       [place], [place]  [address]       Ph: [phone]       Fax: [phone]   RxID:   [id number]    Orders Added: 1)  Capillary Blood Glucose/CBG [id number] 2)  Ophthalmology Referral [Ophthalmology] 3)  Est. Patient Level III [id number] 4)  Est. Patient Level IV [id number] 5)  T-Sed Rate (Automated) [id number]    Laboratory Results   Blood Tests   Date/Time Received: August 26, 2010 10:44 AM   HGBA1C: 5.9%   (Normal Range: Non-Diabetic - 3-6%   Control Diabetic - 6-8%) CBG Random:: 99mg /dL

## 2010-11-16 ENCOUNTER — Ambulatory Visit: Payer: Medicare Other | Attending: Gynecologic Oncology | Admitting: Gynecologic Oncology

## 2010-11-16 DIAGNOSIS — Z79899 Other long term (current) drug therapy: Secondary | ICD-10-CM | POA: Insufficient documentation

## 2010-11-16 DIAGNOSIS — D071 Carcinoma in situ of vulva: Secondary | ICD-10-CM | POA: Insufficient documentation

## 2010-11-17 NOTE — Consult Note (Signed)
NAME:  Diane [name], Diane [name]           ACCOUNT NO.:  [id number]  MEDICAL RECORD NO.:  [id number]  LOCATION:  GYN                          FACILITY:  [facility]  PHYSICIAN:  Paola A. 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  There was no obvious LVSI. She underwent bilateral groin dissection and re-excision of the vulva in September 2011.  All lymph nodes were negative for carcinoma.  There was no residual carcinoma within the vulvar resection specimen.  She comes in today for followup.  When I last saw her, she was complaining of lot of hot flashes, difficulty sleeping and really does not feeling very well.  She states that has improved completely.  She did see Dr. Amil Amen for this and was given something to help her sleep at night. She does not know the name of it but it really has changed her outlook on life.  She is feeling very well, has good energy level.  She gets about 8 hours of sleep at night and feels that she can really function. She continues to have stress secondary to an adult daughter who is unemployed and has been living with her.  She denied any chest pain, shortness of breath, nausea, vomiting, fevers, chills, headaches, visual changes, unintentional weight loss or weight gain.  The weight gain she has been experiencing is stable and leveled off.  HEALTH MAINTENANCE:  She is up-to-date on her mammograms.  ALLERGIES:  SHELLFISH.  MEDICATIONS:  Have not changed and include benazepril, Toprol XL, Dyazide, Norvasc, Wellbutrin and medication to help her sleep, she does not recall the name.  PHYSICAL EXAMINATION:  VITAL SIGNS:   Weight 185 pounds, height 5 feet 7 inches, blood pressure 130/72, pulse 84, respirations 20. GENERAL:  Well-nourished, well-developed female, in no acute distress. NECK:  Supple.  There is no lymphadenopathy, no thyromegaly. LUNGS:  Clear to auscultation bilaterally. CARDIOVASCULAR EXAM:  Regular rate and rhythm. ABDOMEN:  Soft, nontender, nondistended with no palpable masses or hepatosplenomegaly.  Groins are negative for adenopathy.  EXTREMITIES: There is no edema. PELVIC:  External genitalia is within normal limits.  There is evidence of surgical excision.  There are no visible lesions.  Speculum examination reveals no vaginal lesions.  Bimanual examination reveals no masses or nodularity or thickening.  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  PLAN:  Return to see [place] in 3 months.  After that, it will be her 1-year anniversary and we will switch her visits to 6 months.  She will follow up with her other physicians as scheduled.     Paola A. Alycia Rossetti, MD     PAG/MEDQ  D:  11/16/2010  T:  11/16/2010  Job:  [id number]  cc:   Marcelino Duster,  M.D.  Dr. Saunders Glance, R.N. [facility]. [address] [place], [place] [address]  Eli Hose, M.D. [phone]  Electronically Signed by Nancy Marus MD on 11/17/2010 03:09:46 PM

## 2010-12-28 ENCOUNTER — Emergency Department (HOSPITAL_COMMUNITY): Payer: No Typology Code available for payment source

## 2010-12-28 ENCOUNTER — Emergency Department (HOSPITAL_COMMUNITY)
Admission: EM | Admit: 2010-12-28 | Discharge: 2010-12-28 | Disposition: A | Discharge status: Home / Self Care | Payer: No Typology Code available for payment source | Attending: Emergency Medicine | Admitting: Emergency Medicine

## 2010-12-28 DIAGNOSIS — I10 Essential (primary) hypertension: Secondary | ICD-10-CM | POA: Insufficient documentation

## 2010-12-28 DIAGNOSIS — M542 Cervicalgia: Secondary | ICD-10-CM | POA: Insufficient documentation

## 2010-12-28 DIAGNOSIS — Z79899 Other long term (current) drug therapy: Secondary | ICD-10-CM | POA: Insufficient documentation

## 2010-12-28 DIAGNOSIS — M25559 Pain in unspecified hip: Secondary | ICD-10-CM | POA: Insufficient documentation

## 2010-12-28 DIAGNOSIS — R51 Headache: Secondary | ICD-10-CM | POA: Insufficient documentation

## 2010-12-28 DIAGNOSIS — E119 Type 2 diabetes mellitus without complications: Secondary | ICD-10-CM | POA: Insufficient documentation

## 2010-12-28 DIAGNOSIS — M129 Arthropathy, unspecified: Secondary | ICD-10-CM | POA: Insufficient documentation

## 2011-01-20 ENCOUNTER — Inpatient Hospital Stay (INDEPENDENT_AMBULATORY_CARE_PROVIDER_SITE_OTHER)
Admission: RE | Admit: 2011-01-20 | Discharge: 2011-01-20 | Disposition: A | Discharge status: Home / Self Care | Payer: Medicare Other | Source: Ambulatory Visit | Attending: Family Medicine | Admitting: Family Medicine

## 2011-01-20 DIAGNOSIS — J019 Acute sinusitis, unspecified: Secondary | ICD-10-CM

## 2011-02-08 ENCOUNTER — Other Ambulatory Visit: Payer: Self-pay | Admitting: Neurosurgery

## 2011-02-08 DIAGNOSIS — M545 Low back pain, unspecified: Secondary | ICD-10-CM

## 2011-02-14 ENCOUNTER — Ambulatory Visit
Admission: RE | Admit: 2011-02-14 | Discharge: 2011-02-14 | Disposition: A | Discharge status: Home / Self Care | Payer: Medicare Other | Source: Ambulatory Visit | Attending: Neurosurgery | Admitting: Neurosurgery

## 2011-02-14 ENCOUNTER — Other Ambulatory Visit: Payer: Self-pay | Admitting: Neurosurgery

## 2011-02-14 DIAGNOSIS — M545 Low back pain, unspecified: Secondary | ICD-10-CM

## 2011-02-15 ENCOUNTER — Ambulatory Visit: Payer: Medicare Other | Attending: Gynecologic Oncology | Admitting: Gynecologic Oncology

## 2011-02-15 DIAGNOSIS — M545 Low back pain, unspecified: Secondary | ICD-10-CM | POA: Insufficient documentation

## 2011-02-15 DIAGNOSIS — R5383 Other fatigue: Secondary | ICD-10-CM | POA: Insufficient documentation

## 2011-02-15 DIAGNOSIS — IMO0002 Reserved for concepts with insufficient information to code with codable children: Secondary | ICD-10-CM | POA: Insufficient documentation

## 2011-02-15 DIAGNOSIS — G589 Mononeuropathy, unspecified: Secondary | ICD-10-CM | POA: Insufficient documentation

## 2011-02-15 DIAGNOSIS — M129 Arthropathy, unspecified: Secondary | ICD-10-CM | POA: Insufficient documentation

## 2011-02-15 DIAGNOSIS — G988 Other disorders of nervous system: Secondary | ICD-10-CM | POA: Insufficient documentation

## 2011-02-15 DIAGNOSIS — Z79899 Other long term (current) drug therapy: Secondary | ICD-10-CM | POA: Insufficient documentation

## 2011-02-15 DIAGNOSIS — C519 Malignant neoplasm of vulva, unspecified: Secondary | ICD-10-CM | POA: Insufficient documentation

## 2011-02-15 DIAGNOSIS — R5381 Other malaise: Secondary | ICD-10-CM | POA: Insufficient documentation

## 2011-02-17 NOTE — Consult Note (Signed)
NAME:  Diane [name], Diane [name]           ACCOUNT NO.:  [id number]  MEDICAL RECORD NO.:  [id number]  LOCATION:  GYN                          FACILITY:  [facility]  PHYSICIAN:  Paola A. 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  That was a new diagnosis.  She subsequently underwent bilateral groin excisions and re-excision of the vulva in September 2011.  All lymph nodes were negative and the re-excision site showed no residual carcinoma.  I last saw Diane [name] in June 2012, at which time Diane [name] exam was unremarkable.  There was no evidence of recurrent disease.  She comes in today for followup.  REVIEW OF SYSTEMS:  She does  complain of some lower back pain and some neuropathy and weakness.  She has a herniated disk.  She has been diagnosed with arthritis and nerve damage.  She has had injections to Diane [name] back, which have helped somewhat, but the relief is not sustained. She has an MRI this week and goes to see the physician on Monday, to see what the course of care will be.  She had some physical therapy as well as water aerobics, which have helped, but Diane [name] symptoms are fairly persistent.  She has been trying to lose weight and has lost about 9 pounds since Diane [name] last visit that has been intentional in an effort to be healthier, but also to see if it would mitigate Diane [name] back pain and symptoms.  She is sleeping well.  She denies any vaginal bleeding, any chest pain, shortness of breath, nausea, vomiting, fevers, chills, unintentional weight loss, weight gain, change in bowel or bladder habits.  10-point review of systems is negative.  HEALTH MAINTENANCE:  She is up to date on Diane [name] mammograms.  ALLERGIES:  SELFISH.  MEDICATIONS:  Include: 1. Benazepril. 2.  Toprol XL. 3. Dyazide. 4. Norvasc. 5. Wellbutrin.  PHYSICAL EXAMINATION:  VITAL SIGNS:  Weight 176 pounds, which is down 9 pounds from Diane [name] last visit.  Height 5 feet 7 inches.  Blood pressure 122/60, pulse 64, respirations 16, temperature 98. GENERAL:  Well-nourished, well-developed female in no acute distress. NECK:  Supple.  There is no lymphadenopathy, no thyromegaly. LUNGS:  Clear to auscultation bilaterally. CARDIOVASCULAR:  Regular rate and rhythm. ABDOMEN:  Soft, nontender, nondistended.  No palpable masses or hepatosplenomegaly.  Groins are negative for adenopathy. EXTREMITIES:  There is no edema. PELVIC:  External genitalia is notable for surgical excisions.  There is no evidence of any visible lesions.  Bimanual examination reveals no thickening, masses, nodularity in the perineum of the vulva.  ASSESSMENT:  70 year old with a stage IB squamous cell carcinoma of vulva, who clinically has no evidence of recurrent disease, 1 year out from Diane [name] diagnosis.  PLAN:  Return to see [place] in 6 months.  She will follow up with Diane [name] other physicians as scheduled.     Paola A. Alycia Rossetti, MDPAG/MEDQ  D:  02/15/2011  T:  02/15/2011  Job:  [id number]  cc:   Marcelino Duster, M.D.  Dr. Saunders Glance, R.N. [address] [place],  [address]  Eli Hose, M.D. [phone]  Electronically Signed by Nancy Marus MD on 02/17/2011 07:57:42 AM

## 2011-03-09 ENCOUNTER — Encounter (HOSPITAL_COMMUNITY)
Admission: RE | Admit: 2011-03-09 | Discharge: 2011-03-09 | Disposition: A | Discharge status: Home / Self Care | Payer: Medicare Other | Source: Ambulatory Visit | Attending: Neurosurgery | Admitting: Neurosurgery

## 2011-03-09 ENCOUNTER — Other Ambulatory Visit (HOSPITAL_COMMUNITY): Payer: Self-pay | Admitting: Neurosurgery

## 2011-03-09 DIAGNOSIS — M48061 Spinal stenosis, lumbar region without neurogenic claudication: Secondary | ICD-10-CM

## 2011-03-09 DIAGNOSIS — M47816 Spondylosis without myelopathy or radiculopathy, lumbar region: Secondary | ICD-10-CM

## 2011-03-09 DIAGNOSIS — M4310 Spondylolisthesis, site unspecified: Secondary | ICD-10-CM

## 2011-03-09 LAB — CBC
HCT: 33.3 % — ABNORMAL LOW (ref 36.0–46.0)
Hemoglobin: 11.3 g/dL — ABNORMAL LOW (ref 12.0–15.0)
MCH: 28 pg (ref 26.0–34.0)
MCHC: 33.9 g/dL (ref 30.0–36.0)
MCV: 82.4 fL (ref 78.0–100.0)
Platelets: 246 10*3/uL (ref 150–400)
RBC: 4.04 MIL/uL (ref 3.87–5.11)
RDW: 14.3 % (ref 11.5–15.5)
WBC: 6.3 10*3/uL (ref 4.0–10.5)

## 2011-03-09 LAB — URINALYSIS, ROUTINE W REFLEX MICROSCOPIC
Bilirubin Urine: NEGATIVE
Glucose, UA: NEGATIVE mg/dL
Hgb urine dipstick: NEGATIVE
Ketones, ur: NEGATIVE mg/dL
Nitrite: NEGATIVE
Protein, ur: NEGATIVE mg/dL
Specific Gravity, Urine: 1.009 (ref 1.005–1.030)
Urobilinogen, UA: 0.2 mg/dL (ref 0.0–1.0)
pH: 7.5 (ref 5.0–8.0)

## 2011-03-09 LAB — ABO/RH: ABO/RH(D): B POS

## 2011-03-09 LAB — SURGICAL PCR SCREEN
MRSA, PCR: NEGATIVE
Staphylococcus aureus: NEGATIVE

## 2011-03-09 LAB — BASIC METABOLIC PANEL
BUN: 21 mg/dL (ref 6–23)
CO2: 26 mEq/L (ref 19–32)
Calcium: 9.5 mg/dL (ref 8.4–10.5)
Chloride: 100 mEq/L (ref 96–112)
Creatinine, Ser: 1.34 mg/dL — ABNORMAL HIGH (ref 0.50–1.10)
GFR calc Af Amer: 46 mL/min — ABNORMAL LOW (ref >90)
GFR calc non Af Amer: 40 mL/min — ABNORMAL LOW (ref >90)
Glucose, Bld: 73 mg/dL (ref 70–99)
Potassium: 4.1 mEq/L (ref 3.5–5.1)
Sodium: 135 mEq/L (ref 135–145)

## 2011-03-09 LAB — URINE MICROSCOPIC-ADD ON

## 2011-03-09 LAB — TYPE AND SCREEN
ABO/RH(D): B POS
Antibody Screen: NEGATIVE

## 2011-03-17 ENCOUNTER — Inpatient Hospital Stay (HOSPITAL_COMMUNITY)
Admission: RE | Admit: 2011-03-17 | Discharge: 2011-03-24 | DRG: 460 | Disposition: A | Discharge status: Home / Self Care | Payer: Medicare Other | Source: Ambulatory Visit | Attending: Neurosurgery | Admitting: Neurosurgery

## 2011-03-17 ENCOUNTER — Inpatient Hospital Stay (HOSPITAL_COMMUNITY): Payer: Medicare Other

## 2011-03-17 DIAGNOSIS — Z01812 Encounter for preprocedural laboratory examination: Secondary | ICD-10-CM

## 2011-03-17 DIAGNOSIS — Z87891 Personal history of nicotine dependence: Secondary | ICD-10-CM

## 2011-03-17 DIAGNOSIS — Z7982 Long term (current) use of aspirin: Secondary | ICD-10-CM

## 2011-03-17 DIAGNOSIS — Z01818 Encounter for other preprocedural examination: Secondary | ICD-10-CM

## 2011-03-17 DIAGNOSIS — Z8544 Personal history of malignant neoplasm of other female genital organs: Secondary | ICD-10-CM

## 2011-03-17 DIAGNOSIS — I4891 Unspecified atrial fibrillation: Secondary | ICD-10-CM | POA: Diagnosis not present

## 2011-03-17 DIAGNOSIS — E119 Type 2 diabetes mellitus without complications: Secondary | ICD-10-CM | POA: Diagnosis present

## 2011-03-17 DIAGNOSIS — M4310 Spondylolisthesis, site unspecified: Principal | ICD-10-CM | POA: Diagnosis present

## 2011-03-17 DIAGNOSIS — Z79899 Other long term (current) drug therapy: Secondary | ICD-10-CM

## 2011-03-17 DIAGNOSIS — M47817 Spondylosis without myelopathy or radiculopathy, lumbosacral region: Secondary | ICD-10-CM | POA: Diagnosis present

## 2011-03-17 DIAGNOSIS — E669 Obesity, unspecified: Secondary | ICD-10-CM | POA: Diagnosis present

## 2011-03-17 DIAGNOSIS — I2510 Atherosclerotic heart disease of native coronary artery without angina pectoris: Secondary | ICD-10-CM | POA: Diagnosis present

## 2011-03-17 DIAGNOSIS — I10 Essential (primary) hypertension: Secondary | ICD-10-CM | POA: Diagnosis present

## 2011-03-17 DIAGNOSIS — I495 Sick sinus syndrome: Secondary | ICD-10-CM | POA: Diagnosis not present

## 2011-03-17 DIAGNOSIS — Z01810 Encounter for preprocedural cardiovascular examination: Secondary | ICD-10-CM

## 2011-03-17 DIAGNOSIS — D649 Anemia, unspecified: Secondary | ICD-10-CM | POA: Diagnosis not present

## 2011-03-17 HISTORY — PX: OTHER SURGICAL HISTORY: SHX169

## 2011-03-17 LAB — GLUCOSE, CAPILLARY
Glucose-Capillary: 97 mg/dL (ref 70–99)
Glucose-Capillary: 161 mg/dL — ABNORMAL HIGH (ref 70–99)
Glucose-Capillary: 165 mg/dL — ABNORMAL HIGH (ref 70–99)

## 2011-03-18 LAB — GLUCOSE, CAPILLARY
Glucose-Capillary: 113 mg/dL — ABNORMAL HIGH (ref 70–99)
Glucose-Capillary: 124 mg/dL — ABNORMAL HIGH (ref 70–99)
Glucose-Capillary: 134 mg/dL — ABNORMAL HIGH (ref 70–99)
Glucose-Capillary: 136 mg/dL — ABNORMAL HIGH (ref 70–99)
Glucose-Capillary: 141 mg/dL — ABNORMAL HIGH (ref 70–99)
Glucose-Capillary: 147 mg/dL — ABNORMAL HIGH (ref 70–99)

## 2011-03-19 LAB — GLUCOSE, CAPILLARY
Glucose-Capillary: 101 mg/dL — ABNORMAL HIGH (ref 70–99)
Glucose-Capillary: 106 mg/dL — ABNORMAL HIGH (ref 70–99)
Glucose-Capillary: 111 mg/dL — ABNORMAL HIGH (ref 70–99)
Glucose-Capillary: 116 mg/dL — ABNORMAL HIGH (ref 70–99)
Glucose-Capillary: 119 mg/dL — ABNORMAL HIGH (ref 70–99)

## 2011-03-20 LAB — GLUCOSE, CAPILLARY
Glucose-Capillary: 94 mg/dL (ref 70–99)
Glucose-Capillary: 105 mg/dL — ABNORMAL HIGH (ref 70–99)
Glucose-Capillary: 107 mg/dL — ABNORMAL HIGH (ref 70–99)
Glucose-Capillary: 98 mg/dL (ref 70–99)
Glucose-Capillary: 109 mg/dL — ABNORMAL HIGH (ref 70–99)

## 2011-03-21 LAB — CBC
HCT: 26.1 % — ABNORMAL LOW (ref 36.0–46.0)
Hemoglobin: 9 g/dL — ABNORMAL LOW (ref 12.0–15.0)
MCH: 27.7 pg (ref 26.0–34.0)
MCHC: 34.5 g/dL (ref 30.0–36.0)
MCV: 80.3 fL (ref 78.0–100.0)
Platelets: 269 10*3/uL (ref 150–400)
RBC: 3.25 MIL/uL — ABNORMAL LOW (ref 3.87–5.11)
RDW: 14 % (ref 11.5–15.5)
WBC: 9.8 10*3/uL (ref 4.0–10.5)

## 2011-03-21 LAB — GLUCOSE, CAPILLARY
Glucose-Capillary: 87 mg/dL (ref 70–99)
Glucose-Capillary: 83 mg/dL (ref 70–99)
Glucose-Capillary: 82 mg/dL (ref 70–99)
Glucose-Capillary: 102 mg/dL — ABNORMAL HIGH (ref 70–99)

## 2011-03-21 LAB — HEPARIN LEVEL (UNFRACTIONATED): Heparin Unfractionated: <0.1 IU/mL — ABNORMAL LOW (ref 0.30–0.70)

## 2011-03-22 LAB — LIPID PANEL
Cholesterol: 140 mg/dL (ref 0–200)
HDL: 34 mg/dL — ABNORMAL LOW (ref >39)
LDL Cholesterol: 83 mg/dL (ref 0–99)
Total CHOL/HDL Ratio: 4.1 RATIO
Triglycerides: 114 mg/dL (ref <150)
VLDL: 23 mg/dL (ref 0–40)

## 2011-03-22 LAB — CBC
HCT: 24.7 % — ABNORMAL LOW (ref 36.0–46.0)
Hemoglobin: 8.3 g/dL — ABNORMAL LOW (ref 12.0–15.0)
MCH: 27 pg (ref 26.0–34.0)
MCHC: 33.6 g/dL (ref 30.0–36.0)
MCV: 80.5 fL (ref 78.0–100.0)
Platelets: 260 10*3/uL (ref 150–400)
RBC: 3.07 MIL/uL — ABNORMAL LOW (ref 3.87–5.11)
RDW: 13.9 % (ref 11.5–15.5)
WBC: 8.2 10*3/uL (ref 4.0–10.5)

## 2011-03-22 LAB — HEPARIN LEVEL (UNFRACTIONATED)
Heparin Unfractionated: 0.23 IU/mL — ABNORMAL LOW (ref 0.30–0.70)
Heparin Unfractionated: 0.51 IU/mL (ref 0.30–0.70)

## 2011-03-22 LAB — BASIC METABOLIC PANEL
BUN: 17 mg/dL (ref 6–23)
CO2: 23 mEq/L (ref 19–32)
Calcium: 9 mg/dL (ref 8.4–10.5)
Chloride: 106 mEq/L (ref 96–112)
Creatinine, Ser: 1.19 mg/dL — ABNORMAL HIGH (ref 0.50–1.10)
GFR calc Af Amer: 54 mL/min — ABNORMAL LOW (ref >90)
GFR calc non Af Amer: 46 mL/min — ABNORMAL LOW (ref >90)
Glucose, Bld: 107 mg/dL — ABNORMAL HIGH (ref 70–99)
Potassium: 3.7 mEq/L (ref 3.5–5.1)
Sodium: 140 mEq/L (ref 135–145)

## 2011-03-22 LAB — GLUCOSE, CAPILLARY
Glucose-Capillary: 93 mg/dL (ref 70–99)
Glucose-Capillary: 106 mg/dL — ABNORMAL HIGH (ref 70–99)
Glucose-Capillary: 89 mg/dL (ref 70–99)
Glucose-Capillary: 89 mg/dL (ref 70–99)
Glucose-Capillary: 114 mg/dL — ABNORMAL HIGH (ref 70–99)

## 2011-03-22 LAB — TSH: TSH: 2.231 u[IU]/mL (ref 0.350–4.500)

## 2011-03-22 LAB — MAGNESIUM: Magnesium: 1.6 mg/dL (ref 1.5–2.5)

## 2011-03-23 LAB — GLUCOSE, CAPILLARY
Glucose-Capillary: 100 mg/dL — ABNORMAL HIGH (ref 70–99)
Glucose-Capillary: 75 mg/dL (ref 70–99)
Glucose-Capillary: 92 mg/dL (ref 70–99)
Glucose-Capillary: 109 mg/dL — ABNORMAL HIGH (ref 70–99)
Glucose-Capillary: 113 mg/dL — ABNORMAL HIGH (ref 70–99)

## 2011-03-23 LAB — BASIC METABOLIC PANEL
BUN: 15 mg/dL (ref 6–23)
CO2: 22 mEq/L (ref 19–32)
Calcium: 8.9 mg/dL (ref 8.4–10.5)
Chloride: 105 mEq/L (ref 96–112)
Creatinine, Ser: 1.17 mg/dL — ABNORMAL HIGH (ref 0.50–1.10)
GFR calc Af Amer: 55 mL/min — ABNORMAL LOW (ref >90)
GFR calc non Af Amer: 47 mL/min — ABNORMAL LOW (ref >90)
Glucose, Bld: 105 mg/dL — ABNORMAL HIGH (ref 70–99)
Potassium: 3.9 mEq/L (ref 3.5–5.1)
Sodium: 137 mEq/L (ref 135–145)

## 2011-03-23 LAB — CBC
HCT: 24 % — ABNORMAL LOW (ref 36.0–46.0)
Hemoglobin: 8.2 g/dL — ABNORMAL LOW (ref 12.0–15.0)
MCH: 27.2 pg (ref 26.0–34.0)
MCHC: 34.2 g/dL (ref 30.0–36.0)
MCV: 79.7 fL (ref 78.0–100.0)
Platelets: 268 10*3/uL (ref 150–400)
RBC: 3.01 MIL/uL — ABNORMAL LOW (ref 3.87–5.11)
RDW: 13.7 % (ref 11.5–15.5)
WBC: 6.7 10*3/uL (ref 4.0–10.5)

## 2011-03-23 LAB — MAGNESIUM: Magnesium: 1.9 mg/dL (ref 1.5–2.5)

## 2011-03-24 LAB — CBC
HCT: 22.2 % — ABNORMAL LOW (ref 36.0–46.0)
Hemoglobin: 7.5 g/dL — ABNORMAL LOW (ref 12.0–15.0)
MCH: 27.3 pg (ref 26.0–34.0)
MCHC: 33.8 g/dL (ref 30.0–36.0)
MCV: 80.7 fL (ref 78.0–100.0)
Platelets: 281 10*3/uL (ref 150–400)
RBC: 2.75 MIL/uL — ABNORMAL LOW (ref 3.87–5.11)
RDW: 13.9 % (ref 11.5–15.5)
WBC: 7.5 10*3/uL (ref 4.0–10.5)

## 2011-03-27 LAB — GLUCOSE, CAPILLARY: Glucose-Capillary: 101 mg/dL — ABNORMAL HIGH (ref 70–99)

## 2011-03-28 NOTE — Discharge Summary (Signed)
  NAME:  Diane [name], Diane [name]           ACCOUNT NO.:  [id number]  MEDICAL RECORD NO.:  [id number]  LOCATION:  [id number]                         FACILITY:  [facility]  PHYSICIAN:  Ashok Pall, M.D.     DATE OF BIRTH:  [birth year]  DATE OF ADMISSION:  03/17/2011 DATE OF DISCHARGE:  03/24/2011                              DISCHARGE SUMMARY   ADMITTING DIAGNOSIS:  Lumbar degenerative spondylolisthesis and lumbar fusion.  DISCHARGE DIAGNOSIS:  Lumbar degenerative spondylolisthesis and lumbar fusion.  PROCEDURE:  L5-S1 lumbar decompression with interbody PEEK placement and pedicle screw fixation.  COMPLICATIONS:  Operatively none.  She did have postop atrial fibrillation treated with IV heparin and then subsequently Lovenox.  She had medication changed from amlodipine to Cardizem.  Did not have any hemodynamic difficulties.  Wound is clean and dry.  No signs of infection at discharge.  Ambulating well, tolerating a regular diet, and voiding without difficulty.  She will have a followup with Dr. Terrence Dupont, her cardiologist.  I will see her in approximately 3-4 weeks.  INSTRUCTIONS:  No heavy bending, lifting, twisting, or driving for 10 days was discussed.  She understands and will be discharged home today.          ______________________________ Ashok Pall, M.D.     KC/MEDQ  D:  03/24/2011  T:  03/24/2011  Job:  [id number]  Electronically Signed by Ashok Pall M.D. on 03/28/2011 02:52:51 PM

## 2011-04-11 NOTE — Op Note (Signed)
NAME:  Diane [name], Diane [name]           ACCOUNT NO.:  [id number]  MEDICAL RECORD NO.:  [id number]  LOCATION:  [id number]                         FACILITY:  [facility]  PHYSICIAN:  Ashok Pall, M.D.     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  PROCEDURE: 1. Posterior lumbar interbody arthrodesis, L4-5 with cages. 2. Pedicle screw fixation, L4-5 nonsegmental. 3. Posterolateral arthrodesis.  ANESTHESIA:  General endotracheal.  SURGEON:  Ashok Pall, MD.  INDICATIONS:  Diane [name] presented with significant pain in the lower back.  She had conservative treatment, which was unsuccessful.  We made a decision to proceed with operative decompression.  OPERATIVE NOTE:  Diane [name] was brought to the operating room, intubated, and placed under general anesthetic without difficulty.  She was rolled prone onto a Jackson table, and all pressure points were properly padded.  Foley catheter was placed under sterile conditions. The back was prepped, and she was draped in a sterile fashion.  I infiltrated 10 mL of 0.5% lidocaine 1: 200,000 strength epinephrine into the lumbar region.  I opened the skin with a #10 blade and took this down to the thoracolumbar fascia.  I then exposed the lamina of both L4 and L5 bilaterally.  I confirmed this with intraoperative x-ray.  I then proceeded to perform a decompression prior to doing hemilaminectomies of L4 and L5, removing the ligamentum flavum and performing aggressive foraminotomies at both the L4 and L5 roots bilaterally.  This was done without difficulty.  I then performed bilateral diskectomies at L4-5  as part of the posterior lumbar interbody arthrodesis.  I sized the space and then placed interbody cages filled with morselized autograft.  I was able to place the cages without difficulty retracting the thecal sac medially on both occasions and protecting the nerve root.  I then placed pedicle screws using 3 short pedicle screws and 1 standard pedicle screw without difficulty using fluoroscopy, a tap to ensure that there were no cutouts and then place in the screws.  This was done with layer instrumentation.  I completed the arthrodesis with morselized auto and allograft from L4- L5 bilaterally.  After decorticating the transverse processes of L4 and L5 and the lateral margins of the facets.  I completed the instrumentation by connecting the screws or rods and affixing them to the screw heads with locking caps.  I then irrigated the wound.  I then closed the wound in a layered fashion using Vicryl sutures to reapproximate thoracolumbar and subcutaneous tissues.  X-ray showed the interbody and screws to be in good position.          ______________________________ Ashok Pall, M.D.     KC/MEDQ  D:  04/11/2011  T:  04/11/2011  Job:  [id number]

## 2011-06-21 DIAGNOSIS — Z23 Encounter for immunization: Secondary | ICD-10-CM | POA: Diagnosis not present

## 2011-06-21 DIAGNOSIS — E785 Hyperlipidemia, unspecified: Secondary | ICD-10-CM | POA: Diagnosis not present

## 2011-06-21 DIAGNOSIS — J3489 Other specified disorders of nose and nasal sinuses: Secondary | ICD-10-CM | POA: Diagnosis not present

## 2011-06-21 DIAGNOSIS — E119 Type 2 diabetes mellitus without complications: Secondary | ICD-10-CM | POA: Diagnosis not present

## 2011-06-21 DIAGNOSIS — I10 Essential (primary) hypertension: Secondary | ICD-10-CM | POA: Diagnosis not present

## 2011-07-10 DIAGNOSIS — I10 Essential (primary) hypertension: Secondary | ICD-10-CM | POA: Diagnosis not present

## 2011-07-10 DIAGNOSIS — E7800 Pure hypercholesterolemia, unspecified: Secondary | ICD-10-CM | POA: Diagnosis not present

## 2011-07-10 DIAGNOSIS — E119 Type 2 diabetes mellitus without complications: Secondary | ICD-10-CM | POA: Diagnosis not present

## 2011-07-21 ENCOUNTER — Other Ambulatory Visit (HOSPITAL_COMMUNITY): Payer: Self-pay | Admitting: Internal Medicine

## 2011-07-21 DIAGNOSIS — L608 Other nail disorders: Secondary | ICD-10-CM | POA: Diagnosis not present

## 2011-07-21 DIAGNOSIS — E1149 Type 2 diabetes mellitus with other diabetic neurological complication: Secondary | ICD-10-CM | POA: Diagnosis not present

## 2011-07-21 DIAGNOSIS — Z1231 Encounter for screening mammogram for malignant neoplasm of breast: Secondary | ICD-10-CM

## 2011-08-03 DIAGNOSIS — E785 Hyperlipidemia, unspecified: Secondary | ICD-10-CM | POA: Diagnosis not present

## 2011-08-15 ENCOUNTER — Encounter: Payer: Self-pay | Admitting: Gynecologic Oncology

## 2011-08-15 DIAGNOSIS — I10 Essential (primary) hypertension: Secondary | ICD-10-CM | POA: Diagnosis not present

## 2011-08-16 ENCOUNTER — Ambulatory Visit: Payer: Medicare Other | Attending: Gynecologic Oncology | Admitting: Gynecologic Oncology

## 2011-08-16 ENCOUNTER — Encounter: Payer: Self-pay | Admitting: Gynecologic Oncology

## 2011-08-16 VITALS — BP 130/58 | HR 66 | Temp 97.5°F | Resp 16 | Ht 67.13 in | Wt 180.8 lb

## 2011-08-16 DIAGNOSIS — C519 Malignant neoplasm of vulva, unspecified: Secondary | ICD-10-CM

## 2011-08-16 NOTE — Patient Instructions (Signed)
RTC in 6 months.

## 2011-08-16 NOTE — Progress Notes (Signed)
Consult Note: Gyn-Onc  Diane [name] 71 y.o. female  CC:  Chief Complaint  Patient presents with  . Vulvar Cancer    Follow up    HPI: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 That was a new diagnosis. She subsequently underwent bilateral  groin excisions and re-excision of the vulva in September 2011. All lymph nodes were negative and the re-excision site showed no residual carcinoma. I last saw her in Sept. 2012, at which time her exam was  unremarkable. There was no evidence of recurrent disease. She comes in today for followup.   Interval History:  S/p back surgery, very successful with resolution of her pain.  MMG due next week.  Review of Systems: She has been trying to lose weight. She is sleeping well. She denies any vaginal bleeding, any chest pain, shortness of breath, nausea, vomiting, fevers, chills, unintentional weight loss, weight gain, change in bowel or bladder.  She had labs with her primary MD and was told that she was "dehydrated" and to drink more water.  She has done that and had labs re-drawn yesterday.  Her sugars are running in the 130s-200s.  She cannot remember what her A1C was.   Current Meds:  Outpatient Encounter Prescriptions as of 08/16/2011  Medication Sig Dispense Refill  . AmLODIPine Besylate (NORVASC PO) Take 10 mg by mouth daily.       . Triamterene-HCTZ (DYAZIDE PO) Take 1 tablet by mouth daily. 37.5/25 mg      . BENAZEPRIL HCL PO Take 40 mg by mouth daily. "took me off of it last week because I was not drinking enough water"      . BuPROPion HCl (WELLBUTRIN PO) Take by mouth.      . Metoprolol Succinate (TOPROL XL PO) Take 50 mg by mouth daily.         Allergy:  Allergies  Allergen Reactions  . Shellfish Allergy Swelling    Social Hx:   History   Social History  . Marital Status: Widowed    Spouse Name: N/A    Number of  Children: N/A  . Years of Education: N/A   Occupational History  . Not on file.   Social History Main Topics  . Smoking status: Former Smoker -- 50 years    Types: Cigarettes    Quit date: 05/29/2009  . Smokeless tobacco: Not on file  . Alcohol Use: No  . Drug Use: No  . Sexually Active: Not on file   Other Topics Concern  . Not on file   Social History Narrative  . No narrative on file    Past Surgical Hx:  Past Surgical History  Procedure Date  . Wide local excision 01/2010    Bilateral groin excisions, re-excision of vulva  . Posterior lumbar arthrodesis, pedicle screw fixation, posterolateral arthodesis 03/17/11    Past Medical Hx:  Past Medical History  Diagnosis Date  . 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 Lower back pain   . Herniated disc   . Arthritis   . Lumbar stenosis     L4-5  . Spondylolysis     Family Hx:  Family History  Problem Relation Age of Onset  . Stroke Mother   . Alzheimer's disease Mother   . Heart attack Father   . Heart attack Brother     Vitals:  Blood  pressure 130/58, pulse 66, temperature 97.5 F (36.4 C), temperature source Oral, resp. rate 16, height 5' 7.13" (1.705 m), weight 180 lb 12.8 oz (82.01 kg).  Physical Exam: GENERAL: Well-nourished, well-developed female in no acute distress.  NECK: Supple. There is no lymphadenopathy, no thyromegaly.  LUNGS: Clear to auscultation bilaterally.  CARDIOVASCULAR: Regular rate and rhythm.  ABDOMEN: Soft, nontender, nondistended. No palpable masses or hepatosplenomegaly. Groins are negative for adenopathy.  EXTREMITIES: There is no edema.  PELVIC: External genitalia is notable for surgical excisions. There is no evidence of any visible lesions. Bimanual examination reveals no thickening, masses, nodularity in the perineum of the vulva. Speculum exam negative.  Assessment/Plan: ASSESSMENT: 71 year old with a stage IB squamous cell carcinoma of  vulva, who clinically has  no evidence of recurrent disease, >1 (7/11) year out  from her diagnosis.  PLAN: Return to see [place] in 6 months. She will follow up with her other  physicians as scheduled.She will check on when she is due for her next pap smear and I am happy to do that for her at her next visit if necessary.  Leotta Weingarten A., MD 08/16/2011, 11:12 AM

## 2011-08-23 ENCOUNTER — Ambulatory Visit (HOSPITAL_COMMUNITY)
Admission: RE | Admit: 2011-08-23 | Discharge: 2011-08-23 | Disposition: A | Discharge status: Home / Self Care | Payer: Medicare Other | Source: Ambulatory Visit | Attending: Internal Medicine | Admitting: Internal Medicine

## 2011-08-23 DIAGNOSIS — Z1231 Encounter for screening mammogram for malignant neoplasm of breast: Secondary | ICD-10-CM | POA: Insufficient documentation

## 2011-09-18 DIAGNOSIS — I10 Essential (primary) hypertension: Secondary | ICD-10-CM | POA: Diagnosis not present

## 2011-09-18 DIAGNOSIS — E7800 Pure hypercholesterolemia, unspecified: Secondary | ICD-10-CM | POA: Diagnosis not present

## 2011-09-18 DIAGNOSIS — I2510 Atherosclerotic heart disease of native coronary artery without angina pectoris: Secondary | ICD-10-CM | POA: Diagnosis not present

## 2011-09-18 DIAGNOSIS — E119 Type 2 diabetes mellitus without complications: Secondary | ICD-10-CM | POA: Diagnosis not present

## 2011-09-20 DIAGNOSIS — I10 Essential (primary) hypertension: Secondary | ICD-10-CM | POA: Diagnosis not present

## 2011-09-20 DIAGNOSIS — J309 Allergic rhinitis, unspecified: Secondary | ICD-10-CM | POA: Diagnosis not present

## 2011-09-20 DIAGNOSIS — E119 Type 2 diabetes mellitus without complications: Secondary | ICD-10-CM | POA: Diagnosis not present

## 2011-09-20 DIAGNOSIS — E663 Overweight: Secondary | ICD-10-CM | POA: Diagnosis not present

## 2011-09-20 DIAGNOSIS — D509 Iron deficiency anemia, unspecified: Secondary | ICD-10-CM | POA: Diagnosis not present

## 2011-10-20 DIAGNOSIS — E1149 Type 2 diabetes mellitus with other diabetic neurological complication: Secondary | ICD-10-CM | POA: Diagnosis not present

## 2011-10-20 DIAGNOSIS — L608 Other nail disorders: Secondary | ICD-10-CM | POA: Diagnosis not present

## 2011-11-17 ENCOUNTER — Other Ambulatory Visit: Payer: Self-pay | Admitting: Cardiology

## 2011-12-18 DIAGNOSIS — I10 Essential (primary) hypertension: Secondary | ICD-10-CM | POA: Diagnosis not present

## 2011-12-18 DIAGNOSIS — E7800 Pure hypercholesterolemia, unspecified: Secondary | ICD-10-CM | POA: Diagnosis not present

## 2011-12-18 DIAGNOSIS — E119 Type 2 diabetes mellitus without complications: Secondary | ICD-10-CM | POA: Diagnosis not present

## 2011-12-18 DIAGNOSIS — I2510 Atherosclerotic heart disease of native coronary artery without angina pectoris: Secondary | ICD-10-CM | POA: Diagnosis not present

## 2011-12-20 DIAGNOSIS — D509 Iron deficiency anemia, unspecified: Secondary | ICD-10-CM | POA: Diagnosis not present

## 2011-12-20 DIAGNOSIS — E119 Type 2 diabetes mellitus without complications: Secondary | ICD-10-CM | POA: Diagnosis not present

## 2011-12-20 DIAGNOSIS — I10 Essential (primary) hypertension: Secondary | ICD-10-CM | POA: Diagnosis not present

## 2011-12-22 DIAGNOSIS — L723 Sebaceous cyst: Secondary | ICD-10-CM | POA: Diagnosis not present

## 2012-01-16 DIAGNOSIS — L600 Ingrowing nail: Secondary | ICD-10-CM | POA: Diagnosis not present

## 2012-01-16 DIAGNOSIS — E1149 Type 2 diabetes mellitus with other diabetic neurological complication: Secondary | ICD-10-CM | POA: Diagnosis not present

## 2012-02-08 ENCOUNTER — Ambulatory Visit: Payer: Medicare Other | Attending: Gynecologic Oncology | Admitting: Gynecologic Oncology

## 2012-02-08 ENCOUNTER — Encounter: Payer: Self-pay | Admitting: Gynecologic Oncology

## 2012-02-08 VITALS — BP 138/80 | HR 68 | Temp 97.7°F | Resp 16 | Ht 67.13 in | Wt 187.6 lb

## 2012-02-08 DIAGNOSIS — C519 Malignant neoplasm of vulva, unspecified: Secondary | ICD-10-CM | POA: Diagnosis not present

## 2012-02-08 DIAGNOSIS — Z79899 Other long term (current) drug therapy: Secondary | ICD-10-CM | POA: Diagnosis not present

## 2012-02-08 NOTE — Progress Notes (Signed)
Consult Note: Gyn-Onc  Diane [name] 71 y.o. female  CC:  Chief Complaint  Patient presents with  . 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 That was a new diagnosis. She subsequently underwent bilateral groin excisions and re-excision of the vulva in September 2011. All lymph nodes were negative and the re-excision site showed no residual carcinoma. I last saw her in Sept. 2012, at which time her exam was  unremarkable. There was no evidence of recurrent disease. She comes in today for followup.   Interval History:  I last saw her March of this year. She states that overall her back is better though she does have followup with physician next month as there is some arthritis-type discomfort and she feels like the toes on her right foot are being affected from arthritis-type changes. He had her mammogram in March of 2013 that was negative. She has not had Pap smears she is status post hysterectomy. She was taken off her metformin for about 3 months and has followup in one months time to check her A1c. She states was low previously and that is why her metformin was stopped. She has gained 7 pounds since we last saw her. She states that she has been eating a lot of chocolates and that she needs to have" her snacks. She's not been very physically active secondary to her back discomfort. At one time she was doing water aerobics but she has subsequently stopped those.  Review of Systems:  She is sleeping well. She denies any vaginal bleeding, any chest pain, shortness of breath, nausea, vomiting, fevers, chills, unintentional weight loss, weight gain, change in bowel or bladder. 10 point review of systems is negative.   Current Meds:  Outpatient Encounter Prescriptions as of 02/08/2012  Medication Sig Dispense Refill  . AmLODIPine Besylate (NORVASC PO)  Take 10 mg by mouth daily.       [name] Kitchen BENAZEPRIL HCL PO Take 40 mg by mouth daily. "took me off of it last week because I was not drinking enough water"      . BuPROPion HCl (WELLBUTRIN PO) Take by mouth.      . Metoprolol Succinate (TOPROL XL PO) Take 50 mg by mouth daily.       . Triamterene-HCTZ (DYAZIDE PO) Take 1 tablet by mouth daily. 37.5/25 mg        Allergy:  Allergies  Allergen Reactions  . Shellfish Allergy Swelling    Social Hx:   History   Social History  . Marital Status: Widowed    Spouse Name: N/A    Number of Children: N/A  . Years of Education: N/A   Occupational History  . Not on file.   Social History Main Topics  . Smoking status: Former Smoker -- 50 years    Types: Cigarettes    Quit date: 05/29/2009  . Smokeless tobacco: Not on file  . Alcohol Use: No  . Drug Use: No  . Sexually Active: Not on file   Other Topics Concern  . Not on file   Social History Narrative  . No narrative on file    Past Surgical Hx:  Past Surgical History  Procedure Date  . Wide local excision 01/2010    Bilateral groin excisions, re-excision of vulva  . Posterior lumbar arthrodesis, pedicle screw fixation, posterolateral arthodesis 03/17/11  Past Medical Hx:  Past Medical History  Diagnosis Date  . 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 Lower back pain   . Herniated disc   . Arthritis   . Lumbar stenosis     L4-5  . Spondylolysis     Family Hx:  Family History  Problem Relation Age of Onset  . Stroke Mother   . Alzheimer's disease Mother   . Heart attack Father   . Heart attack Brother     Vitals:  Blood pressure 138/80, pulse 68, temperature 97.7 F (36.5 C), temperature source Oral, resp. rate 16, height 5' 7.13" (1.705 m), weight 187 lb 9.6 oz (85.095 kg).  Physical Exam:   GENERAL: Well-nourished, well-developed female in no acute distress.  NECK: Supple. There is no lymphadenopathy, no thyromegaly.  LUNGS: Clear to auscultation  bilaterally.  CARDIOVASCULAR: Regular rate and rhythm.  ABDOMEN: Soft, nontender, nondistended. No palpable masses or hepatosplenomegaly. Groins are negative for adenopathy.  EXTREMITIES: There is no edema.  PELVIC: External genitalia is notable for surgical excisions. There is no evidence of any visible lesions except for one small lesion on the right mons consistent with folliculitis.. Bimanual examination reveals no thickening, masses, nodularity in the perineum of the vulva. Speculum exam negative.  Rectal confirms.  Assessment/Plan:  ASSESSMENT: 71 year old with a stage IB squamous cell carcinoma of vulva, who clinically has no evidence of recurrent disease, > 2 (7/11) years out from her diagnosis.   PLAN: Return to see [place] in 6 months. She will follow up with her other physicians as scheduled.    GEHRIG,PAOLA A., MD 02/08/2012, 10:17 AM

## 2012-02-08 NOTE — Patient Instructions (Signed)
See GYN oncology in 6 months.

## 2012-03-14 DIAGNOSIS — M542 Cervicalgia: Secondary | ICD-10-CM | POA: Diagnosis not present

## 2012-03-18 DIAGNOSIS — I10 Essential (primary) hypertension: Secondary | ICD-10-CM | POA: Diagnosis not present

## 2012-03-18 DIAGNOSIS — I4891 Unspecified atrial fibrillation: Secondary | ICD-10-CM | POA: Diagnosis not present

## 2012-03-18 DIAGNOSIS — I2510 Atherosclerotic heart disease of native coronary artery without angina pectoris: Secondary | ICD-10-CM | POA: Diagnosis not present

## 2012-03-20 DIAGNOSIS — M719 Bursopathy, unspecified: Secondary | ICD-10-CM | POA: Diagnosis not present

## 2012-03-20 DIAGNOSIS — M67919 Unspecified disorder of synovium and tendon, unspecified shoulder: Secondary | ICD-10-CM | POA: Diagnosis not present

## 2012-03-21 DIAGNOSIS — I10 Essential (primary) hypertension: Secondary | ICD-10-CM | POA: Diagnosis not present

## 2012-03-21 DIAGNOSIS — Z23 Encounter for immunization: Secondary | ICD-10-CM | POA: Diagnosis not present

## 2012-03-21 DIAGNOSIS — Z0000 Encounter for general adult medical examination without abnormal findings: Secondary | ICD-10-CM | POA: Diagnosis not present

## 2012-03-21 DIAGNOSIS — E119 Type 2 diabetes mellitus without complications: Secondary | ICD-10-CM | POA: Diagnosis not present

## 2012-03-21 DIAGNOSIS — I4891 Unspecified atrial fibrillation: Secondary | ICD-10-CM | POA: Diagnosis not present

## 2012-03-21 DIAGNOSIS — E663 Overweight: Secondary | ICD-10-CM | POA: Diagnosis not present

## 2012-03-21 DIAGNOSIS — Z780 Asymptomatic menopausal state: Secondary | ICD-10-CM | POA: Diagnosis not present

## 2012-03-21 DIAGNOSIS — E7800 Pure hypercholesterolemia, unspecified: Secondary | ICD-10-CM | POA: Diagnosis not present

## 2012-03-29 DIAGNOSIS — M19019 Primary osteoarthritis, unspecified shoulder: Secondary | ICD-10-CM | POA: Diagnosis not present

## 2012-04-01 DIAGNOSIS — M75120 Complete rotator cuff tear or rupture of unspecified shoulder, not specified as traumatic: Secondary | ICD-10-CM | POA: Diagnosis not present

## 2012-04-09 DIAGNOSIS — E1149 Type 2 diabetes mellitus with other diabetic neurological complication: Secondary | ICD-10-CM | POA: Diagnosis not present

## 2012-04-09 DIAGNOSIS — L608 Other nail disorders: Secondary | ICD-10-CM | POA: Diagnosis not present

## 2012-05-13 DIAGNOSIS — M75120 Complete rotator cuff tear or rupture of unspecified shoulder, not specified as traumatic: Secondary | ICD-10-CM | POA: Diagnosis not present

## 2012-05-27 ENCOUNTER — Other Ambulatory Visit: Payer: Self-pay | Admitting: Orthopedic Surgery

## 2012-05-30 ENCOUNTER — Encounter (HOSPITAL_BASED_OUTPATIENT_CLINIC_OR_DEPARTMENT_OTHER): Payer: Self-pay | Admitting: *Deleted

## 2012-05-30 NOTE — Progress Notes (Signed)
To come in for bmet-will get ekg and recent cardiac notes from dr Terrence Dupont

## 2012-06-03 ENCOUNTER — Encounter (HOSPITAL_BASED_OUTPATIENT_CLINIC_OR_DEPARTMENT_OTHER)
Admission: RE | Admit: 2012-06-03 | Discharge: 2012-06-03 | Disposition: A | Discharge status: Home / Self Care | Payer: Medicare Other | Source: Ambulatory Visit | Attending: Orthopedic Surgery | Admitting: Orthopedic Surgery

## 2012-06-03 DIAGNOSIS — M19019 Primary osteoarthritis, unspecified shoulder: Secondary | ICD-10-CM | POA: Diagnosis not present

## 2012-06-03 DIAGNOSIS — Z01812 Encounter for preprocedural laboratory examination: Secondary | ICD-10-CM | POA: Diagnosis not present

## 2012-06-03 DIAGNOSIS — I2510 Atherosclerotic heart disease of native coronary artery without angina pectoris: Secondary | ICD-10-CM | POA: Diagnosis not present

## 2012-06-03 DIAGNOSIS — E785 Hyperlipidemia, unspecified: Secondary | ICD-10-CM | POA: Diagnosis not present

## 2012-06-03 DIAGNOSIS — M67919 Unspecified disorder of synovium and tendon, unspecified shoulder: Secondary | ICD-10-CM | POA: Diagnosis not present

## 2012-06-03 DIAGNOSIS — I10 Essential (primary) hypertension: Secondary | ICD-10-CM | POA: Diagnosis not present

## 2012-06-03 LAB — BASIC METABOLIC PANEL
BUN: 26 mg/dL — ABNORMAL HIGH (ref 6–23)
CO2: 27 mEq/L (ref 19–32)
Calcium: 9 mg/dL (ref 8.4–10.5)
Chloride: 102 mEq/L (ref 96–112)
Creatinine, Ser: 1.45 mg/dL — ABNORMAL HIGH (ref 0.50–1.10)
GFR calc Af Amer: 42 mL/min — ABNORMAL LOW (ref >90)
GFR calc non Af Amer: 36 mL/min — ABNORMAL LOW (ref >90)
Glucose, Bld: 101 mg/dL — ABNORMAL HIGH (ref 70–99)
Potassium: 4 mEq/L (ref 3.5–5.1)
Sodium: 140 mEq/L (ref 135–145)

## 2012-06-04 ENCOUNTER — Encounter (HOSPITAL_BASED_OUTPATIENT_CLINIC_OR_DEPARTMENT_OTHER): Payer: Self-pay | Admitting: Anesthesiology

## 2012-06-04 ENCOUNTER — Ambulatory Visit (HOSPITAL_BASED_OUTPATIENT_CLINIC_OR_DEPARTMENT_OTHER): Payer: Medicare Other | Admitting: Anesthesiology

## 2012-06-04 ENCOUNTER — Encounter (HOSPITAL_BASED_OUTPATIENT_CLINIC_OR_DEPARTMENT_OTHER)
Admission: RE | Disposition: A | Discharge status: Home / Self Care | Payer: Self-pay | Source: Ambulatory Visit | Attending: Orthopedic Surgery

## 2012-06-04 ENCOUNTER — Ambulatory Visit (HOSPITAL_BASED_OUTPATIENT_CLINIC_OR_DEPARTMENT_OTHER)
Admission: RE | Admit: 2012-06-04 | Discharge: 2012-06-04 | Disposition: A | Discharge status: Home / Self Care | Payer: Medicare Other | Source: Ambulatory Visit | Attending: Orthopedic Surgery | Admitting: Orthopedic Surgery

## 2012-06-04 ENCOUNTER — Encounter (HOSPITAL_BASED_OUTPATIENT_CLINIC_OR_DEPARTMENT_OTHER): Payer: Self-pay

## 2012-06-04 DIAGNOSIS — I10 Essential (primary) hypertension: Secondary | ICD-10-CM | POA: Diagnosis not present

## 2012-06-04 DIAGNOSIS — M719 Bursopathy, unspecified: Secondary | ICD-10-CM | POA: Diagnosis not present

## 2012-06-04 DIAGNOSIS — M67919 Unspecified disorder of synovium and tendon, unspecified shoulder: Secondary | ICD-10-CM | POA: Insufficient documentation

## 2012-06-04 DIAGNOSIS — M19019 Primary osteoarthritis, unspecified shoulder: Secondary | ICD-10-CM | POA: Insufficient documentation

## 2012-06-04 DIAGNOSIS — G8918 Other acute postprocedural pain: Secondary | ICD-10-CM | POA: Diagnosis not present

## 2012-06-04 DIAGNOSIS — M75100 Unspecified rotator cuff tear or rupture of unspecified shoulder, not specified as traumatic: Secondary | ICD-10-CM

## 2012-06-04 DIAGNOSIS — M25519 Pain in unspecified shoulder: Secondary | ICD-10-CM | POA: Diagnosis not present

## 2012-06-04 DIAGNOSIS — E785 Hyperlipidemia, unspecified: Secondary | ICD-10-CM | POA: Diagnosis not present

## 2012-06-04 DIAGNOSIS — I2510 Atherosclerotic heart disease of native coronary artery without angina pectoris: Secondary | ICD-10-CM | POA: Insufficient documentation

## 2012-06-04 DIAGNOSIS — S43429A Sprain of unspecified rotator cuff capsule, initial encounter: Secondary | ICD-10-CM | POA: Diagnosis not present

## 2012-06-04 DIAGNOSIS — Z01812 Encounter for preprocedural laboratory examination: Secondary | ICD-10-CM | POA: Insufficient documentation

## 2012-06-04 DIAGNOSIS — M25819 Other specified joint disorders, unspecified shoulder: Secondary | ICD-10-CM | POA: Diagnosis not present

## 2012-06-04 HISTORY — DX: Hyperlipidemia, unspecified: E78.5

## 2012-06-04 HISTORY — PX: SHOULDER ARTHROSCOPY WITH ROTATOR CUFF REPAIR AND SUBACROMIAL DECOMPRESSION: SHX5686

## 2012-06-04 HISTORY — DX: Atherosclerotic heart disease of native coronary artery without angina pectoris: I25.10

## 2012-06-04 HISTORY — DX: Essential (primary) hypertension: I10

## 2012-06-04 LAB — POCT HEMOGLOBIN-HEMACUE: Hemoglobin: 10.8 g/dL — ABNORMAL LOW (ref 12.0–15.0)

## 2012-06-04 LAB — GLUCOSE, CAPILLARY: Glucose-Capillary: 111 mg/dL — ABNORMAL HIGH (ref 70–99)

## 2012-06-04 SURGERY — SHOULDER ARTHROSCOPY WITH ROTATOR CUFF REPAIR AND SUBACROMIAL DECOMPRESSION
Anesthesia: General | Site: Shoulder | Laterality: Left | Wound class: Clean

## 2012-06-04 MED ORDER — OXYCODONE HCL 5 MG PO TABS: 5.0000 mg | ORAL_TABLET | Freq: Once | ORAL | Status: DC | PRN

## 2012-06-04 MED ORDER — BUPIVACAINE-EPINEPHRINE PF 0.5-1:200000 % IJ SOLN
INTRAMUSCULAR | Status: DC | PRN
  Administered 2012-06-04: 30 mL

## 2012-06-04 MED ORDER — SODIUM CHLORIDE 0.9 % IR SOLN
Status: DC | PRN
  Administered 2012-06-04: 6000 mL

## 2012-06-04 MED ORDER — LACTATED RINGERS IV SOLN
INTRAVENOUS | Status: DC
  Administered 2012-06-04 (×2): via INTRAVENOUS

## 2012-06-04 MED ORDER — MIDAZOLAM HCL 2 MG/2ML IJ SOLN
1.0000 mg | INTRAMUSCULAR | Status: DC | PRN
  Administered 2012-06-04: 1 mg via INTRAVENOUS

## 2012-06-04 MED ORDER — OXYCODONE-ACETAMINOPHEN 5-325 MG PO TABS: 1.0000 | ORAL_TABLET | ORAL | Status: DC | PRN

## 2012-06-04 MED ORDER — MIDAZOLAM HCL 5 MG/5ML IJ SOLN
INTRAMUSCULAR | Status: DC | PRN
  Administered 2012-06-04: 1 mg via INTRAVENOUS

## 2012-06-04 MED ORDER — ONDANSETRON HCL 4 MG/2ML IJ SOLN: 4.0000 mg | Freq: Four times a day (QID) | INTRAMUSCULAR | Status: DC | PRN

## 2012-06-04 MED ORDER — CEFAZOLIN SODIUM-DEXTROSE 2-3 GM-% IV SOLR: 2.0000 g | INTRAVENOUS | Status: DC

## 2012-06-04 MED ORDER — ONDANSETRON HCL 4 MG/2ML IJ SOLN
INTRAMUSCULAR | Status: DC | PRN
  Administered 2012-06-04: 4 mg via INTRAVENOUS

## 2012-06-04 MED ORDER — SUCCINYLCHOLINE CHLORIDE 20 MG/ML IJ SOLN
INTRAMUSCULAR | Status: DC | PRN
  Administered 2012-06-04: 100 mg via INTRAVENOUS

## 2012-06-04 MED ORDER — OXYCODONE HCL 5 MG/5ML PO SOLN: 5.0000 mg | Freq: Once | ORAL | Status: DC | PRN

## 2012-06-04 MED ORDER — HYDROMORPHONE HCL PF 1 MG/ML IJ SOLN: 0.2500 mg | INTRAMUSCULAR | Status: DC | PRN

## 2012-06-04 MED ORDER — POVIDONE-IODINE 7.5 % EX SOLN: Freq: Once | CUTANEOUS | Status: DC

## 2012-06-04 MED ORDER — LIDOCAINE HCL (CARDIAC) 20 MG/ML IV SOLN
INTRAVENOUS | Status: DC | PRN
  Administered 2012-06-04: 75 mg via INTRAVENOUS

## 2012-06-04 MED ORDER — PROPOFOL 10 MG/ML IV BOLUS
INTRAVENOUS | Status: DC | PRN
  Administered 2012-06-04: 150 mg via INTRAVENOUS

## 2012-06-04 MED ORDER — DEXAMETHASONE SODIUM PHOSPHATE 4 MG/ML IJ SOLN
INTRAMUSCULAR | Status: DC | PRN
  Administered 2012-06-04: 4 mg via INTRAVENOUS

## 2012-06-04 MED ORDER — FENTANYL CITRATE 0.05 MG/ML IJ SOLN
INTRAMUSCULAR | Status: DC | PRN
  Administered 2012-06-04: 50 ug via INTRAVENOUS

## 2012-06-04 MED ORDER — EPHEDRINE SULFATE 50 MG/ML IJ SOLN
INTRAMUSCULAR | Status: DC | PRN
  Administered 2012-06-04: 5 mg via INTRAVENOUS

## 2012-06-04 MED ORDER — FENTANYL CITRATE 0.05 MG/ML IJ SOLN
50.0000 ug | INTRAMUSCULAR | Status: DC | PRN
  Administered 2012-06-04: 50 ug via INTRAVENOUS

## 2012-06-04 SURGICAL SUPPLY — 83 items
ADH SKN CLS APL DERMABOND .7 (GAUZE/BANDAGES/DRESSINGS)
ANCH SUT 2 FT CRKSW 14.7X5.5 (Anchor) ×1 IMPLANT
ANCHOR CORKSCREW FIBER 5.5X15 (Anchor) ×1 IMPLANT
APL SKNCLS STERI-STRIP NONHPOA (GAUZE/BANDAGES/DRESSINGS) ×1
BENZOIN TINCTURE PRP APPL 2/3 (GAUZE/BANDAGES/DRESSINGS) ×1 IMPLANT
BLADE SURG 15 STRL LF DISP TIS (BLADE) IMPLANT
BLADE SURG 15 STRL SS (BLADE)
BLADE SURG ROTATE 9660 (MISCELLANEOUS) IMPLANT
BUR OVAL 4.0 (BURR) ×2 IMPLANT
CANISTER OMNI JUG 16 LITER (MISCELLANEOUS) ×2 IMPLANT
CANISTER SUCTION 2500CC (MISCELLANEOUS) IMPLANT
CANNULA 5.75X71 LONG (CANNULA) ×2 IMPLANT
CANNULA TWIST IN 8.25X7CM (CANNULA) ×1 IMPLANT
CHLORAPREP W/TINT 26ML (MISCELLANEOUS) ×2 IMPLANT
CLOTH BEACON ORANGE TIMEOUT ST (SAFETY) ×2 IMPLANT
DECANTER SPIKE VIAL GLASS SM (MISCELLANEOUS) IMPLANT
DERMABOND ADVANCED (GAUZE/BANDAGES/DRESSINGS)
DERMABOND ADVANCED .7 DNX12 (GAUZE/BANDAGES/DRESSINGS) IMPLANT
DRAPE INCISE IOBAN 66X45 STRL (DRAPES) ×2 IMPLANT
DRAPE STERI 35X30 U-POUCH (DRAPES) ×2 IMPLANT
DRAPE SURG 17X23 STRL (DRAPES) ×2 IMPLANT
DRAPE U 20/CS (DRAPES) ×2 IMPLANT
DRAPE U-SHAPE 47X51 STRL (DRAPES) ×2 IMPLANT
DRAPE U-SHAPE 76X120 STRL (DRAPES) ×4 IMPLANT
DRSG PAD ABDOMINAL 8X10 ST (GAUZE/BANDAGES/DRESSINGS) ×2 IMPLANT
ELECT REM PT RETURN 9FT ADLT (ELECTROSURGICAL)
ELECTRODE REM PT RTRN 9FT ADLT (ELECTROSURGICAL) ×1 IMPLANT
GAUZE SPONGE 4X4 16PLY XRAY LF (GAUZE/BANDAGES/DRESSINGS) IMPLANT
GAUZE XEROFORM 1X8 LF (GAUZE/BANDAGES/DRESSINGS) ×2 IMPLANT
GLOVE BIO SURGEON STRL SZ 6.5 (GLOVE) ×2 IMPLANT
GLOVE BIO SURGEON STRL SZ7 (GLOVE) ×2 IMPLANT
GLOVE BIO SURGEON STRL SZ7.5 (GLOVE) ×3 IMPLANT
GLOVE BIOGEL PI IND STRL 7.0 (GLOVE) ×1 IMPLANT
GLOVE BIOGEL PI IND STRL 8 (GLOVE) ×2 IMPLANT
GLOVE BIOGEL PI INDICATOR 7.0 (GLOVE) ×2
GLOVE BIOGEL PI INDICATOR 8 (GLOVE) ×2
GOWN BRE IMP PREV XXLGXLNG (GOWN DISPOSABLE) ×1 IMPLANT
GOWN PREVENTION PLUS XLARGE (GOWN DISPOSABLE) ×3 IMPLANT
IV NS IRRIG 3000ML ARTHROMATIC (IV SOLUTION) ×4 IMPLANT
NDL 1/2 CIR CATGUT .05X1.09 (NEEDLE) IMPLANT
NDL SCORPION MULTI FIRE (NEEDLE) IMPLANT
NDL SUT 6 .5 CRC .975X.05 MAYO (NEEDLE) IMPLANT
NEEDLE 1/2 CIR CATGUT .05X1.09 (NEEDLE) IMPLANT
NEEDLE MAYO TAPER (NEEDLE)
NEEDLE SCORPION MULTI FIRE (NEEDLE) ×2 IMPLANT
NS IRRIG 1000ML POUR BTL (IV SOLUTION) IMPLANT
PACK ARTHROSCOPY DSU (CUSTOM PROCEDURE TRAY) ×2 IMPLANT
PACK BASIN DAY SURGERY FS (CUSTOM PROCEDURE TRAY) ×2 IMPLANT
PENCIL BUTTON HOLSTER BLD 10FT (ELECTRODE) IMPLANT
RESECTOR FULL RADIUS 4.2MM (BLADE) ×2 IMPLANT
SLEEVE SCD COMPRESS KNEE MED (MISCELLANEOUS) ×2 IMPLANT
SLING ARM FOAM STRAP LRG (SOFTGOODS) ×1 IMPLANT
SLING ARM FOAM STRAP MED (SOFTGOODS) IMPLANT
SLING ARM FOAM STRAP XLG (SOFTGOODS) IMPLANT
SLING ARM IMMOBILIZER MED (SOFTGOODS) IMPLANT
SPONGE GAUZE 4X4 12PLY (GAUZE/BANDAGES/DRESSINGS) ×2 IMPLANT
SPONGE LAP 4X18 X RAY DECT (DISPOSABLE) IMPLANT
STRIP CLOSURE SKIN 1/2X4 (GAUZE/BANDAGES/DRESSINGS) ×1 IMPLANT
SUCTION FRAZIER TIP 10 FR DISP (SUCTIONS) IMPLANT
SUPPORT WRAP ARM LG (MISCELLANEOUS) IMPLANT
SUT BONE WAX W31G (SUTURE) IMPLANT
SUT ETHIBOND 2 OS 4 DA (SUTURE) IMPLANT
SUT ETHILON 3 0 PS 1 (SUTURE) ×2 IMPLANT
SUT ETHILON 4 0 PS 2 18 (SUTURE) IMPLANT
SUT FIBERWIRE #2 38 T-5 BLUE (SUTURE)
SUT FIBERWIRE 2-0 18 17.9 3/8 (SUTURE)
SUT MNCRL AB 3-0 PS2 18 (SUTURE) IMPLANT
SUT MNCRL AB 4-0 PS2 18 (SUTURE) ×1 IMPLANT
SUT PDS AB 0 CT 36 (SUTURE) IMPLANT
SUT PROLENE 3 0 PS 2 (SUTURE) IMPLANT
SUT VIC AB 0 CT1 18XCR BRD 8 (SUTURE) IMPLANT
SUT VIC AB 0 CT1 8-18 (SUTURE)
SUT VIC AB 2-0 SH 18 (SUTURE) IMPLANT
SUTURE FIBERWR #2 38 T-5 BLUE (SUTURE) IMPLANT
SUTURE FIBERWR 2-0 18 17.9 3/8 (SUTURE) IMPLANT
SYR BULB 3OZ (MISCELLANEOUS) IMPLANT
TOWEL OR 17X24 6PK STRL BLUE (TOWEL DISPOSABLE) ×2 IMPLANT
TOWEL OR NON WOVEN STRL DISP B (DISPOSABLE) ×1 IMPLANT
TUBE CONNECTING 20X1/4 (TUBING) ×2 IMPLANT
TUBING ARTHROSCOPY IRRIG 16FT (MISCELLANEOUS) ×2 IMPLANT
WAND STAR VAC 90 (SURGICAL WAND) ×2 IMPLANT
WATER STERILE IRR 1000ML POUR (IV SOLUTION) ×2 IMPLANT
YANKAUER SUCT BULB TIP NO VENT (SUCTIONS) IMPLANT

## 2012-06-04 NOTE — Anesthesia Procedure Notes (Addendum)
Anesthesia Regional Block:  Interscalene brachial plexus block  Pre-Anesthetic Checklist: ,, timeout performed, Correct Patient, Correct Site, Correct Laterality, Correct Procedure, Correct Position, site marked, Risks and benefits discussed,  Surgical consent,  Pre-op evaluation,  At surgeon's request and post-op pain management  Laterality: Left  Prep: chloraprep       Needles:  Injection technique: Single-shot  Needle Type: Echogenic Stimulator Needle     Needle Length: 5cm 5 cm Needle Gauge: 22 and 22 G    Additional Needles:  Procedures: ultrasound guided (picture in chart) and nerve stimulator Interscalene brachial plexus block  Nerve Stimulator or Paresthesia:  Response: biceps flexion, 0.45 mA,   Additional Responses:   Narrative:  Start time: 06/04/2012 9:24 AM End time: 06/04/2012 9:39 AM Injection made incrementally with aspirations every 5 mL.  Performed by: Personally  Anesthesiologist: Dr Marcie Bal  Additional Notes: Functioning IV was confirmed and monitors were applied.  A 29mm 22ga Arrow echogenic stimulator needle was used. Sterile prep and drape,hand hygiene and sterile gloves were used.  Negative aspiration and negative test dose prior to incremental administration of local anesthetic. The patient tolerated the procedure well.  Ultrasound guidance: relevent anatomy identified, needle position confirmed, local anesthetic spread visualized around nerve(s), vascular puncture avoided.  Image printed for medical record.   Interscalene brachial plexus block Procedure Name: Intubation Date/Time: 06/04/2012 10:10 AM Performed by: Melynda Ripple D Pre-anesthesia Checklist: Patient identified, Emergency Drugs available, Suction available, Patient being monitored and Timeout performed Patient Re-evaluated:Patient Re-evaluated prior to inductionOxygen Delivery Method: Circle System Utilized Preoxygenation: Pre-oxygenation with 100% oxygen Intubation Type: IV  induction Ventilation: Mask ventilation without difficulty Laryngoscope Size: Miller and 2 Grade View: Grade II Tube type: Oral Tube size: 7.0 mm Number of attempts: 1 Airway Equipment and Method: stylet,  oral airway and Video-laryngoscopy Placement Confirmation: ETT inserted through vocal cords under direct vision,  positive ETCO2,  breath sounds checked- equal and bilateral and CO2 detector Secured at: 23 cm Tube secured with: Tape Dental Injury: Teeth and Oropharynx as per pre-operative assessment  Difficulty Due To: Difficult Airway- due to limited oral opening and Difficult Airway- due to dentition

## 2012-06-04 NOTE — Anesthesia Preprocedure Evaluation (Signed)
Anesthesia Evaluation  Patient identified by MRN, date of birth, ID band Patient awake    Reviewed: Allergy & Precautions, H&P , NPO status , Patient's Chart, lab work & pertinent test results  Airway Mallampati: II  Neck ROM: full    Dental   Pulmonary former smoker,          Cardiovascular hypertension, + CAD     Neuro/Psych  Headaches, Depression    GI/Hepatic   Endo/Other  diabetes, Type 2  Renal/GU      Musculoskeletal  (+) Arthritis -,   Abdominal   Peds  Hematology   Anesthesia Other Findings   Reproductive/Obstetrics                           Anesthesia Physical Anesthesia Plan  ASA: III  Anesthesia Plan: General and Regional   Post-op Pain Management: MAC Combined w/ Regional for Post-op pain   Induction: Intravenous  Airway Management Planned: Oral ETT  Additional Equipment:   Intra-op Plan:   Post-operative Plan: Extubation in OR  Informed Consent: I have reviewed the patients History and Physical, chart, labs and discussed the procedure including the risks, benefits and alternatives for the proposed anesthesia with the patient or authorized representative who has indicated his/her understanding and acceptance.     Plan Discussed with: CRNA and Surgeon  Anesthesia Plan Comments:         Anesthesia Quick Evaluation

## 2012-06-04 NOTE — Anesthesia Postprocedure Evaluation (Signed)
Anesthesia Post Note  Patient: Diane [name]  Procedure(s) Performed: Procedure(s) (LRB): SHOULDER ARTHROSCOPY WITH ROTATOR CUFF REPAIR AND SUBACROMIAL DECOMPRESSION (Left)  Anesthesia type: General  Patient location: PACU  Post pain: Pain level controlled and Adequate analgesia  Post assessment: Post-op Vital signs reviewed, Patient's Cardiovascular Status Stable, Respiratory Function Stable, Patent Airway and Pain level controlled  Last Vitals:  Filed Vitals:   06/04/12 1239  BP:   Pulse: 56  Temp:   Resp: 13    Post vital signs: Reviewed and stable  Level of consciousness: awake, alert  and oriented  Complications: No apparent anesthesia complications

## 2012-06-04 NOTE — H&P (Signed)
Diane [name] is an 72 y.o. female.   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  Failed conservative treatment.  Past Medical History  Diagnosis Date  . Lower back pain   . Herniated disc   . Arthritis   . Lumbar stenosis     L4-5  . Spondylolysis   . Coronary artery disease   . Hypertension   . Hyperlipemia   . 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 Wide local excision 01/2010    Bilateral groin excisions, re-excision of vulva  . Posterior lumbar arthrodesis, pedicle screw fixation, posterolateral arthodesis 03/17/11  . Tonsillectomy   . Abdominal hysterectomy   . Dilation and curettage of uterus   . Tubal ligation   . Colonoscopy     Family History  Problem Relation Age of Onset  . Stroke Mother   . Alzheimer's disease Mother   . Heart attack Father   . Heart attack Brother    Social History:  reports that she quit smoking about 3 years ago. Her smoking use included Cigarettes. She quit after 50 years of use. She does not have any smokeless tobacco history on file. She reports that she does not drink alcohol or use illicit drugs.  Allergies:  Allergies  Allergen Reactions  . Shellfish Allergy Swelling    Medications Prior to Admission  Medication Sig Dispense Refill  . FLUoxetine (PROZAC) 10 MG capsule Take 10 mg by mouth daily.      . nebivolol (BYSTOLIC) 5 MG tablet Take 5 mg by mouth daily.      . pravastatin (PRAVACHOL) 40 MG tablet Take 40 mg by mouth every evening.      . Triamterene-HCTZ (DYAZIDE PO) Take 1 tablet by mouth daily. 37.5/25 mg        Results for orders placed during the hospital encounter of 06/04/12 (from the past 48 hour(s))  BASIC METABOLIC PANEL     Status: Abnormal   Collection Time   06/03/12 12:00 PM      Component Value Range Comment   Sodium 140  135 - 145 mEq/L    Potassium 4.0  3.5 - 5.1 mEq/L    Chloride 102  96 - 112 mEq/L    CO2 27  19 - 32 mEq/L    Glucose, Bld 101 (*) 70 - 99 mg/dL    BUN 26 (*) 6 - 23 mg/dL    Creatinine, Ser 1.45 (*) 0.50 - 1.10 mg/dL    Calcium 9.0  8.4 - 10.5 mg/dL    GFR calc non Af Amer 36 (*) >90 mL/min    GFR calc Af Amer 42 (*) >90 mL/min    No results found.  Review of Systems  All other systems reviewed and are negative.    Blood pressure 160/83, pulse 43, temperature 98 F (36.7 C), temperature source Oral, resp. rate 13, height 5' 7.5" (1.715 m), weight 84.823 kg (187 lb), SpO2 100.00%. Physical Exam  Constitutional: She is oriented to person, place, and time. She appears well-developed and well-nourished.  HENT:  Head: Atraumatic.  Eyes: EOM are normal.  Cardiovascular: Intact distal pulses.   Respiratory: Effort normal.  Musculoskeletal:       Left shoulder: She exhibits decreased range of motion, tenderness and pain. She exhibits no deformity.  Neurological: She is alert and oriented to person, place,  and time.  Skin: Skin is warm and dry.  Psychiatric: She has a normal mood and affect.     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  Failed conservative treatment. Plan L shoulder arthroscopic SAD/DCE/RCR Risks / benefits of surgery discussed Consent on chart  NPO for OR Preop antibiotics   CHANDLER,JUSTIN WILLIAM 06/04/2012, 9:41 AM

## 2012-06-04 NOTE — Transfer of Care (Signed)
Immediate Anesthesia Transfer of Care Note  Patient: Diane [name]  Procedure(s) Performed: Procedure(s) (LRB) with comments: SHOULDER ARTHROSCOPY WITH ROTATOR CUFF REPAIR AND SUBACROMIAL DECOMPRESSION (Left) - LEFT SHOULDER ARTHROSCOPY WITH ROTATOR CUFF REPAIR AND SUBACROMIAL DECOMPRESSION AND DISTAL CLAVICLE RESECTION  Patient Location: PACU  Anesthesia Type:General and Regional  Level of Consciousness: awake and alert   Airway & Oxygen Therapy: Patient Spontanous Breathing and Patient connected to face mask oxygen  Post-op Assessment: Report given to PACU RN and Post -op Vital signs reviewed and stable  Post vital signs: Reviewed and stable  Complications: No apparent anesthesia complications

## 2012-06-04 NOTE — Progress Notes (Signed)
Assisted Dr. Hodierne with left, ultrasound guided, interscalene  block. Side rails up, monitors on throughout procedure. See vital signs in flow sheet. Tolerated Procedure well. 

## 2012-06-04 NOTE — Op Note (Signed)
Procedure(s): SHOULDER ARTHROSCOPY WITH ROTATOR CUFF REPAIR AND SUBACROMIAL DECOMPRESSION Procedure Note  Diane [name] female 72 y.o. 06/04/2012  Procedure(s) and Anesthesia Type:    #1 left shoulder arthroscopic rotator cuff repair    #2 left shoulder arthroscopic subacromial    #3 left shoulder arthroscopic distal clavicle excision    #4 left shoulder arthroscopic debridement proximal long head biceps tear with biceps tenotomy  Postoperative diagnosis:    #1 left shoulder full thickness supraspinatus tear    #2 left shoulder impingement with unfavorable acromial anatomy    #3 left shoulder a.c. joint DJD     #4 left shoulder high-grade tearing of the proximal long head biceps and superior labrum  Surgeon(s) and Role:    * Nita Sells, MD - Primary     Surgeon: Nita Sells   Assistants: Jeanmarie Hubert PA-C [facility] was present and scrubbed throughout the procedure and was essential in positioning, holding the camera, passing instrumentation, and closure)  Anesthesia: General endotracheal anesthesia with preoperative interscalene block   Procedure Detail  Estimated Blood Loss: Min         Drains: none  Blood Given: none         Specimens: none        Complications:  * No complications entered in OR log *         Disposition: PACU - hemodynamically stable.         Condition: stable    Procedure:   INDICATIONS FOR SURGERY: The patient is 72 y.o. female who has a long history of left shoulder pain which has failed conservative management. An MRI revealed full thickness tear of the anterior supraspinatus. She was indicated for arthroscopic surgery to try and decrease her pain and restore function. She understood risks benefits alternatives to the procedure including but not limited to the risk of stiffness nonhealing bleeding infection damage to neurovascular structures etc. She wished to go forward with surgery.  OPERATIVE  FINDINGS: Examination under anesthesia: No stiffness or instability Diagnostic Arthroscopy:  Glenoid articular cartilage: Intact Humeral head articular cartilage: Intact Labrum: She had fraying and harsh elevation of the biceps root extending into the long head biceps tendon involving greater than 50% of the proximal origin of the tendon. Therefore a biceps tenotomy was performed. Loose bodies: None Synovitis: Moderate anterior Rotator cuff: She was noted to have partial-thickness tearing of the upper border of the subscapularis which was debrided. The repair was felt necessary. Full-thickness anterior supraspinatus tear measuring about 1/2 cm anterior to posterior. This was repaired using one 5.5 mm peak corkscrew anchor with 2 horizontal mattress sutures Coracoacromial ligament: Severely frayed indicating chronic impingement with an underlying hooked acromion. A standard acromioplasty was carried out. She was also noted to have severe a.c. joint DJD which was addressed with a distal clavicle excision resecting about 8 mm the distal clavicle and a smooth even fashion.  DESCRIPTION OF PROCEDURE: The patient was identified in preoperative  holding area where I personally marked the operative site after  verifying site, side, and procedure with the patient. An interscalene block was given by the attending anesthesiologist the holding area.  The patient was taken back to the operating room where general anesthesia was induced without complication and was placed in the beach-chair position with the back  elevated about 60 degrees and all extremities and head and neck carefully padded and  positioned.   The left upper extremity was then prepped and  draped in a standard sterile fashion.  The appropriate time-out  procedure was carried out. The patient did receive IV antibiotics  within 30 minutes of incision.   A small posterior portal incision was made and the arthroscope was introduced into the  joint. An anterior portal was then established above the subscapularis using needle localization. Small cannula was placed anteriorly. Diagnostic arthroscopy was then carried out with findings as described above.  The subscapularis was noted to be partially torn on the superior border with a small flat. This was debrided back to healthy appearing tendon. No repair was felt necessary. The biceps tendon was pulled into the joint. Distally the biceps was intact proximally off of the supraglenoid tubercle there was significant elevation the biceps origin with tearing of the proximal long head biceps of approximately 50%. Therefore I felt this could be a continued pain generator and a large basket was used through the anterior intra-articular portal to release the long head biceps off of the supraglenoid tubercle and superior labrum. The origin was debrided. There is a small anterior labral flap tear which was debrided. The glenohumeral joint surfaces were intact. No loose bodies were noted. The rotator cuff was examined from the undersurface found to have a full-thickness small tear. It was debrided from the undersurface. A PDS suture was passed percutaneously to marked the area of most concern.  The arthroscope was then introduced into the subacromial space a standard lateral portal was established with needle localization. The shaver was used through the lateral portal to perform extensive bursectomy. Coracoacromial ligament was examined and found to be severely frayed indicating chronic impingement.  The PDS suture was found and noted to be an area of a small full-thickness tear. Therefore the shaver and ArthroCare were used to debride the tuberosity. The cuff edge was debrided back to healthy tendon. The bur was used on the tuberosity to get down to bleeding bone to smooth the area and promote healing. 5.5 mm peak corkscrew anchor was then placed percutaneously to suffer lateral edge of the acromion about 5 mm  lateral to the articular margin the tuberosity. The sutures are then passed sequentially using a scorpion suture passer posterior to anterior gently spread using 2 horizontal mattress configurations. These were then tied from the lateral portal bringing the tendon nicely down over the prepared tuberosity.  The coracoacromial ligament was taken down off the anterior acromion with the ArthroCare exposing a moderate hooked anterior acromial spur. A high-speed bur was then used through the lateral portal to take down the anterior acromial spur from lateral to medial in a standard acromioplasty.  The acromioplasty was also viewed from the lateral portal and the bur was used as necessary to ensure that the acromion was completely flat from posterior to anterior.  The distal clavicle was exposed arthroscopically and the bur was used to take off the undersurface for approximately 8 mm from the lateral portal. The bur was then moved to an anterior portal position to complete the distal clavicle excision resecting about 8 mm of the distal clavicle and a smooth even fashion. This was viewed from anterior and lateral portals and felt to be complete.  The arthroscopic equipment was removed from the joint and the portals were closed with 3-0 nylon in an interrupted fashion. Sterile dressings were then applied including Xeroform 4 x 4's ABDs and tape. The patient was then allowed to awaken from general anesthesia, placed in a sling, transferred to the stretcher and taken to the recovery room in stable condition.   POSTOPERATIVE  PLAN: The patient will be discharged home today and will followup in one week for suture removal and wound check.  She will follow the standard cuff protocol.

## 2012-06-06 ENCOUNTER — Encounter (HOSPITAL_BASED_OUTPATIENT_CLINIC_OR_DEPARTMENT_OTHER): Payer: Self-pay | Admitting: Orthopedic Surgery

## 2012-06-12 DIAGNOSIS — M75120 Complete rotator cuff tear or rupture of unspecified shoulder, not specified as traumatic: Secondary | ICD-10-CM | POA: Diagnosis not present

## 2012-06-12 DIAGNOSIS — S43429A Sprain of unspecified rotator cuff capsule, initial encounter: Secondary | ICD-10-CM | POA: Diagnosis not present

## 2012-06-17 DIAGNOSIS — I10 Essential (primary) hypertension: Secondary | ICD-10-CM | POA: Diagnosis not present

## 2012-06-17 DIAGNOSIS — H35059 Retinal neovascularization, unspecified, unspecified eye: Secondary | ICD-10-CM | POA: Diagnosis not present

## 2012-06-17 DIAGNOSIS — I4891 Unspecified atrial fibrillation: Secondary | ICD-10-CM | POA: Diagnosis not present

## 2012-06-17 DIAGNOSIS — E7800 Pure hypercholesterolemia, unspecified: Secondary | ICD-10-CM | POA: Diagnosis not present

## 2012-06-17 DIAGNOSIS — E119 Type 2 diabetes mellitus without complications: Secondary | ICD-10-CM | POA: Diagnosis not present

## 2012-06-19 DIAGNOSIS — M75120 Complete rotator cuff tear or rupture of unspecified shoulder, not specified as traumatic: Secondary | ICD-10-CM | POA: Diagnosis not present

## 2012-06-19 DIAGNOSIS — M25519 Pain in unspecified shoulder: Secondary | ICD-10-CM | POA: Diagnosis not present

## 2012-06-27 ENCOUNTER — Emergency Department (INDEPENDENT_AMBULATORY_CARE_PROVIDER_SITE_OTHER)
Admission: EM | Admit: 2012-06-27 | Discharge: 2012-06-27 | Disposition: A | Discharge status: Home / Self Care | Payer: Medicare Other | Source: Home / Self Care | Attending: Emergency Medicine | Admitting: Emergency Medicine

## 2012-06-27 ENCOUNTER — Encounter (HOSPITAL_COMMUNITY): Payer: Self-pay | Admitting: Emergency Medicine

## 2012-06-27 DIAGNOSIS — R319 Hematuria, unspecified: Secondary | ICD-10-CM

## 2012-06-27 DIAGNOSIS — N390 Urinary tract infection, site not specified: Secondary | ICD-10-CM

## 2012-06-27 LAB — POCT URINALYSIS DIP (DEVICE)
Bilirubin Urine: NEGATIVE
Glucose, UA: NEGATIVE mg/dL
Ketones, ur: NEGATIVE mg/dL
Nitrite: POSITIVE — AB
Protein, ur: 100 mg/dL — AB
Specific Gravity, Urine: 1.015 (ref 1.005–1.030)
Urobilinogen, UA: 0.2 mg/dL (ref 0.0–1.0)
pH: 6 (ref 5.0–8.0)

## 2012-06-27 LAB — GLUCOSE, CAPILLARY: Glucose-Capillary: 145 mg/dL — ABNORMAL HIGH (ref 70–99)

## 2012-06-27 MED ORDER — PHENAZOPYRIDINE HCL 200 MG PO TABS: 200.0000 mg | ORAL_TABLET | Freq: Three times a day (TID) | ORAL | Status: DC | PRN

## 2012-06-27 MED ORDER — CEPHALEXIN 500 MG PO CAPS: 500.0000 mg | ORAL_CAPSULE | Freq: Three times a day (TID) | ORAL | Status: DC

## 2012-06-27 NOTE — ED Provider Notes (Signed)
Chief Complaint  Patient presents with  . Urinary Tract Infection    History of Present Illness:   Diane [name] is a 72 year old female with a history of atrial fibrillation who was on her back so for stroke prophylaxis. Today she noted onset of burning with urination, frequency, and urgency. She had blood in her urine. She also notes lower back pain. She had some suprapubic pressure. She denies any fever, chills, nausea, or vomiting. She's eating and drinking well. She had a urinary tract infection years ago. She denies any bleeding in her stool or from her vagina. She does have rotator cuff surgery about 3 weeks ago and this is healing up well. She denies any cardiovascular or strokelike symptoms. She has a history of diabetes, hypertension, and atrial fibrillation. Her blood sugar today at home was 172. Her only other issue is that her stools were light in color today.  Review of Systems:  Other than noted above, the patient denies any of the following symptoms: General:  No fevers, chills, sweats, aches, or fatigue. GI:  No abdominal pain, back pain, nausea, vomiting, diarrhea, or constipation. GU:  No dysuria, frequency, urgency, hematuria, or incontinence. GYN:  No discharge, itching, vulvar pain or lesions, pelvic pain, or abnormal vaginal bleeding.  [place]:  Past medical history, family history, social history, meds, and allergies were reviewed.  Physical Exam:   Vital signs:  BP 135/54  Pulse 55  Temp 97.8 F (36.6 C) (Oral)  Resp 17  SpO2 100% Gen:  Alert, oriented, in no distress. Lungs:  Clear to auscultation, no wheezes, rales or rhonchi. Heart:  Regular rhythm, no gallop or murmer. Abdomen:  Flat and soft. There was slight suprapubic pain to palpation.  No guarding, or rebound.  No hepato-splenomegaly or mass.  Bowel sounds were normally active.  No hernia. Back:  No CVA tenderness.  Skin:  Clear, warm and dry.  Labs:   Results for orders placed during the hospital  encounter of 06/27/12  POCT URINALYSIS DIP (DEVICE)      Component Value Range   Glucose, UA NEGATIVE  NEGATIVE mg/dL   Bilirubin Urine NEGATIVE  NEGATIVE   Ketones, ur NEGATIVE  NEGATIVE mg/dL   Specific Gravity, Urine 1.015  1.005 - 1.030   Hgb urine dipstick LARGE (*) NEGATIVE   pH 6.0  5.0 - 8.0   Protein, ur 100 (*) NEGATIVE mg/dL   Urobilinogen, UA 0.2  0.0 - 1.0 mg/dL   Nitrite POSITIVE (*) NEGATIVE   Leukocytes, UA MODERATE (*) NEGATIVE  GLUCOSE, CAPILLARY      Component Value Range   Glucose-Capillary 145 (*) 70 - 99 mg/dL     Other Labs Obtained at [facility] [place]:  A urine culture was obtained.  Results are pending at this time and we will call about any positive results.  Assessment: The primary encounter diagnosis was UTI (lower urinary tract infection). A diagnosis of Hematuria was also pertinent to this visit.   Plan:   1.  The following meds were prescribed:   New Prescriptions   CEPHALEXIN (KEFLEX) 500 MG CAPSULE    Take 1 capsule (500 mg total) by mouth 3 (three) times daily.   PHENAZOPYRIDINE (PYRIDIUM) 200 MG TABLET    Take 1 tablet (200 mg total) by mouth 3 (three) times daily as needed for pain.   The patient was told to stop the Pradaxa for 10 days, and substitute a baby aspirin per day. After she's finished up with the 10  days of antibiotics, she can resume her usual Pradaxa dose.  2.  The patient was instructed in symptomatic care and handouts were given. 3.  The patient was told to return if becoming worse in any way, if no better in 3 or 4 days, and given some red flag symptoms that would indicate earlier return. 4.  The patient was told to avoid intercourse for 10 days, get extra fluids, and return for a follow up with her primary care doctor at the completion of treatment for a repeat UA and culture.   Follow up:  The patient was told to follow up the patient was told to followup with her primary care physician in 2 weeks to ensure that the hematuria  and the infection has cleared up completely. She was told that the hematuria has not cleared up completely she may need to see a urologist. The patient was told that the differential diagnosis includes cancer, and that it was of the utmost importance to followup with the specialist to whom she was referred.      Harden Mo, MD 06/27/12 [phone]

## 2012-06-27 NOTE — ED Notes (Signed)
Pt c/o poss UTI Sx include: dysuria, frequency/urgency, hematuria, back pain Denies: f/v/n/d  She is alert w/no signs of acute distress.

## 2012-06-28 DIAGNOSIS — M25519 Pain in unspecified shoulder: Secondary | ICD-10-CM | POA: Diagnosis not present

## 2012-06-28 DIAGNOSIS — M75120 Complete rotator cuff tear or rupture of unspecified shoulder, not specified as traumatic: Secondary | ICD-10-CM | POA: Diagnosis not present

## 2012-06-29 LAB — URINE CULTURE
Colony Count: >=100000
Special Requests: NORMAL

## 2012-06-30 NOTE — ED Notes (Signed)
Urine culture: >100,000 colonies E. Coli.  Pt. adequately treated with Keflex. Diane [name] 06/30/2012

## 2012-07-01 DIAGNOSIS — M75120 Complete rotator cuff tear or rupture of unspecified shoulder, not specified as traumatic: Secondary | ICD-10-CM | POA: Diagnosis not present

## 2012-07-01 DIAGNOSIS — M25519 Pain in unspecified shoulder: Secondary | ICD-10-CM | POA: Diagnosis not present

## 2012-07-05 DIAGNOSIS — M75120 Complete rotator cuff tear or rupture of unspecified shoulder, not specified as traumatic: Secondary | ICD-10-CM | POA: Diagnosis not present

## 2012-07-05 DIAGNOSIS — M25519 Pain in unspecified shoulder: Secondary | ICD-10-CM | POA: Diagnosis not present

## 2012-07-08 DIAGNOSIS — I10 Essential (primary) hypertension: Secondary | ICD-10-CM | POA: Diagnosis not present

## 2012-07-08 DIAGNOSIS — E119 Type 2 diabetes mellitus without complications: Secondary | ICD-10-CM | POA: Diagnosis not present

## 2012-07-08 DIAGNOSIS — R319 Hematuria, unspecified: Secondary | ICD-10-CM | POA: Diagnosis not present

## 2012-07-09 DIAGNOSIS — E1149 Type 2 diabetes mellitus with other diabetic neurological complication: Secondary | ICD-10-CM | POA: Diagnosis not present

## 2012-07-09 DIAGNOSIS — L608 Other nail disorders: Secondary | ICD-10-CM | POA: Diagnosis not present

## 2012-07-10 DIAGNOSIS — M75120 Complete rotator cuff tear or rupture of unspecified shoulder, not specified as traumatic: Secondary | ICD-10-CM | POA: Diagnosis not present

## 2012-07-10 DIAGNOSIS — M25519 Pain in unspecified shoulder: Secondary | ICD-10-CM | POA: Diagnosis not present

## 2012-07-17 DIAGNOSIS — M75120 Complete rotator cuff tear or rupture of unspecified shoulder, not specified as traumatic: Secondary | ICD-10-CM | POA: Diagnosis not present

## 2012-07-17 DIAGNOSIS — M25519 Pain in unspecified shoulder: Secondary | ICD-10-CM | POA: Diagnosis not present

## 2012-07-19 DIAGNOSIS — M75120 Complete rotator cuff tear or rupture of unspecified shoulder, not specified as traumatic: Secondary | ICD-10-CM | POA: Diagnosis not present

## 2012-07-19 DIAGNOSIS — M25519 Pain in unspecified shoulder: Secondary | ICD-10-CM | POA: Diagnosis not present

## 2012-07-24 DIAGNOSIS — M25519 Pain in unspecified shoulder: Secondary | ICD-10-CM | POA: Diagnosis not present

## 2012-07-24 DIAGNOSIS — M75120 Complete rotator cuff tear or rupture of unspecified shoulder, not specified as traumatic: Secondary | ICD-10-CM | POA: Diagnosis not present

## 2012-07-25 ENCOUNTER — Other Ambulatory Visit (HOSPITAL_COMMUNITY): Payer: Self-pay | Admitting: Internal Medicine

## 2012-07-25 DIAGNOSIS — Z1231 Encounter for screening mammogram for malignant neoplasm of breast: Secondary | ICD-10-CM

## 2012-07-26 DIAGNOSIS — M75120 Complete rotator cuff tear or rupture of unspecified shoulder, not specified as traumatic: Secondary | ICD-10-CM | POA: Diagnosis not present

## 2012-07-26 DIAGNOSIS — M25519 Pain in unspecified shoulder: Secondary | ICD-10-CM | POA: Diagnosis not present

## 2012-07-30 DIAGNOSIS — M75120 Complete rotator cuff tear or rupture of unspecified shoulder, not specified as traumatic: Secondary | ICD-10-CM | POA: Diagnosis not present

## 2012-07-30 DIAGNOSIS — M25519 Pain in unspecified shoulder: Secondary | ICD-10-CM | POA: Diagnosis not present

## 2012-08-01 DIAGNOSIS — M75120 Complete rotator cuff tear or rupture of unspecified shoulder, not specified as traumatic: Secondary | ICD-10-CM | POA: Diagnosis not present

## 2012-08-01 DIAGNOSIS — M25519 Pain in unspecified shoulder: Secondary | ICD-10-CM | POA: Diagnosis not present

## 2012-08-05 DIAGNOSIS — M75120 Complete rotator cuff tear or rupture of unspecified shoulder, not specified as traumatic: Secondary | ICD-10-CM | POA: Diagnosis not present

## 2012-08-05 DIAGNOSIS — M25519 Pain in unspecified shoulder: Secondary | ICD-10-CM | POA: Diagnosis not present

## 2012-08-08 DIAGNOSIS — M75120 Complete rotator cuff tear or rupture of unspecified shoulder, not specified as traumatic: Secondary | ICD-10-CM | POA: Diagnosis not present

## 2012-08-08 DIAGNOSIS — M25519 Pain in unspecified shoulder: Secondary | ICD-10-CM | POA: Diagnosis not present

## 2012-08-12 DIAGNOSIS — M75120 Complete rotator cuff tear or rupture of unspecified shoulder, not specified as traumatic: Secondary | ICD-10-CM | POA: Diagnosis not present

## 2012-08-12 DIAGNOSIS — M25519 Pain in unspecified shoulder: Secondary | ICD-10-CM | POA: Diagnosis not present

## 2012-08-15 ENCOUNTER — Encounter: Payer: Self-pay | Admitting: Gynecologic Oncology

## 2012-08-15 ENCOUNTER — Ambulatory Visit: Payer: Medicare Other | Attending: Gynecologic Oncology | Admitting: Gynecologic Oncology

## 2012-08-15 VITALS — BP 138/70 | HR 66 | Temp 98.0°F | Resp 18 | Ht 67.13 in | Wt 214.5 lb

## 2012-08-15 DIAGNOSIS — C519 Malignant neoplasm of vulva, unspecified: Secondary | ICD-10-CM | POA: Diagnosis not present

## 2012-08-15 DIAGNOSIS — M75120 Complete rotator cuff tear or rupture of unspecified shoulder, not specified as traumatic: Secondary | ICD-10-CM | POA: Diagnosis not present

## 2012-08-15 DIAGNOSIS — M25519 Pain in unspecified shoulder: Secondary | ICD-10-CM | POA: Diagnosis not present

## 2012-08-15 NOTE — Progress Notes (Signed)
Consult Note: Gyn-Onc  Diane [name] 72 y.o. female  CC:  Chief Complaint  Patient presents with  . 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 That was a new diagnosis. She subsequently underwent bilateral groin excisions and re-excision of the vulva in September 2011. All lymph nodes were negative and the re-excision site showed no residual carcinoma. I last saw her in Sept. 2013, at which time her exam was  unremarkable. There was no evidence of recurrent disease. She comes in today for followup.   Interval History:  She is up to date on her mammograms with one  in March of 2013 that was negative. She has not had Pap smears she is status post hysterectomy. She is still off of her metformin.  She has gained 27 pounds since we last saw her. She believes that the weight gain is due to coming off her outlook with this and starting to Taxol. She states that different than before when she was eating a lot of snacks and she is really not changed when she's eating she's not eating chocolate like she used to. She did have left-sided rotator cuff surgery in January of this year. She's doing physical therapy for this. She'll urinary tract infection one month ago. Prior to it being treated she had some hematuria and that is now resolved. She's noted no vaginal bleeding. She has a mammogram scheduled for March 31. She did try to get gym. She's able to exercise on the elliptical or bike for about an hour and her goal is to do this 2-3 days a week. She has been somewhat limited secondary to the weather and being able to get to the gym.   Review of Systems:  She is sleeping well. She denies any vaginal bleeding, any chest pain, shortness of breath, nausea, vomiting, fevers, chills, unintentional weight loss, weight gain, change in bowel or bladder. 10 point review of  systems is negative. Please refer to interval history for some of her review of systems.   Current Meds:  Outpatient Encounter Prescriptions as of 08/15/2012  Medication Sig Dispense Refill  . dabigatran (PRADAXA) 75 MG CAPS Take 75 mg by mouth every 12 (twelve) hours.      . Ferrous Sulfate (IRON) 325 (65 FE) MG TABS Take 1 tablet by mouth daily. OTC      . FLUoxetine (PROZAC) 10 MG capsule Take 10 mg by mouth daily.      . nebivolol (BYSTOLIC) 5 MG tablet Take 5 mg by mouth daily.      . pravastatin (PRAVACHOL) 40 MG tablet Take 40 mg by mouth every evening.      . TRAZODONE HCL PO Take 1 tablet by mouth daily.      . Triamterene-HCTZ (DYAZIDE PO) Take 1 tablet by mouth daily. 37.5/25 mg      . cephALEXin (KEFLEX) 500 MG capsule Take 1 capsule (500 mg total) by mouth 3 (three) times daily.  30 capsule  0  . oxyCODONE-acetaminophen (ROXICET) 5-325 MG per tablet Take 1-2 tablets by mouth every 4 (four) hours as needed for pain.  60 tablet  0  . phenazopyridine (PYRIDIUM) 200 MG tablet Take 1 tablet (200 mg total) by mouth 3 (three) times daily as needed for pain.  15 tablet  0   No facility-administered encounter medications on file  as of 08/15/2012.    Allergy:  Allergies  Allergen Reactions  . Shellfish Allergy Swelling    Social Hx:   History   Social History  . Marital Status: Widowed    Spouse Name: N/A    Number of Children: N/A  . Years of Education: N/A   Occupational History  . Not on file.   Social History Main Topics  . Smoking status: Former Smoker -- 50 years    Types: Cigarettes    Quit date: 05/29/2009  . Smokeless tobacco: Not on file  . Alcohol Use: No  . Drug Use: No  . Sexually Active: Not on file   Other Topics Concern  . Not on file   Social History Narrative  . No narrative on file    Past Surgical Hx:  Past Surgical History  Procedure Laterality Date  . Wide local excision  01/2010    Bilateral groin excisions, re-excision of vulva  .  Posterior lumbar arthrodesis, pedicle screw fixation, posterolateral arthodesis  03/17/11  . Tonsillectomy    . Abdominal hysterectomy    . Dilation and curettage of uterus    . Tubal ligation    . Colonoscopy    . Shoulder arthroscopy with rotator cuff repair and subacromial decompression  06/04/2012    Procedure: SHOULDER ARTHROSCOPY WITH ROTATOR CUFF REPAIR AND SUBACROMIAL DECOMPRESSION;  Surgeon: Nita Sells, MD;  Location: [facility];  Service: Orthopedics;  Laterality: Left;  LEFT SHOULDER ARTHROSCOPY WITH ROTATOR CUFF REPAIR AND SUBACROMIAL DECOMPRESSION AND DISTAL CLAVICLE RESECTION    Past Medical Hx:  Past Medical History  Diagnosis Date  . Lower back pain   . Herniated disc   . Arthritis   . Lumbar stenosis     L4-5  . Spondylolysis   . Coronary artery disease   . Hypertension   . Hyperlipemia   . Squamous cell carcinoma of vulva     Stage IB    Family Hx:  Family History  Problem Relation Age of Onset  . Stroke Mother   . Alzheimer's disease Mother   . Heart attack Father   . Heart attack Brother     Vitals:  Blood pressure 138/70, pulse 66, temperature 98 F (36.7 C), temperature source Oral, resp. rate 18, height 5' 7.13" (1.705 m), weight 214 lb 8 oz (97.297 kg).  Physical Exam:   GENERAL: Well-nourished, well-developed female in no acute distress.   NECK: Supple. There is no lymphadenopathy, no thyromegaly.   LUNGS: Clear to auscultation bilaterally.   CARDIOVASCULAR: Regular rate and rhythm.   ABDOMEN: Soft, nontender, nondistended. No palpable masses or hepatosplenomegaly. Groins are negative for adenopathy.   EXTREMITIES: There is no edema.   PELVIC: External genitalia is notable for surgical excisions. There is no evidence of any visible lesions except for one small lesion on the right mons consistent with folliculitis.. Bimanual examination reveals no thickening, masses, nodularity in the perineum of the vulva. Speculum  exam negative.  Rectal confirms.  Assessment/Plan:  ASSESSMENT: 72 year old with a stage IB squamous cell carcinoma of vulva, who clinically has no evidence of recurrent disease, > 2 (7/11) years out from her diagnosis.   PLAN: Return to see [place] in 6 months. She will follow up with her other physicians as scheduled.    GEHRIG,PAOLA A., MD 08/15/2012, 10:21 AM

## 2012-08-15 NOTE — Patient Instructions (Signed)
Disorders of the Vulva It is important to know the outside (external) area of the female genitalia to properly examine yourself. The vulva or labia, sometimes also called the lips around the vagina, covers the pubic bone and surrounds the vagina. The larger or outer labia is called the labia majora. The inner or smaller labia is called the labia minora. The clitoris is at the top of the vulva. It is covered by a small area of tissue called the hood. Below the clitoris is the opening of the tube from the bladder, which is the urethral opening. Just below the urethral opening is the opening of the vagina. This area is called the vestibule. Inside the vestibule are bartholin and skene glands that lubricate the outside of the vagina during sexual intercourse. The perineum is the area that lies between the vagina and anus. You should examine your external genitalia once a month just as you do your self breast examination. SIGNS AND SYMPTOMS TO LOOK FOR DURING YOUR EXAMINATION:  Swelling.  Redness.  Bumps.  Blisters.  Black or white areas.  Itching.  Bleeding.  Burning. TYPES OF VULVAR PROBLEMS  Infections.  Yeast (fungus) is the most common infection that causes redness, swelling, itching and a thick white vaginal discharge. Women with diabetes, taking medicines that kill germs (antibiotics) or on birth control pills are at risk for yeast infection. This infection is treated with vaginal creams, suppositories and anti-itch cream for the vulva.  Genital warts (condyloma) are caused by the human papilloma virus. They are raised bumps that can itch, bleed, cause discomfort and sometimes appear in groups like cauliflower. This is a sexually transmitted disease (STD) caused by sexual contact. This can be treated with topical medication, freezing, electrocautery, laser or surgical removal.  Genital herpes is a virus that causes redness, swelling, blisters and ulcers that can cause itching and are  very painful. This is also a STD. There are medications to control the symptoms of herpes, but there is no cure. Once you have it, you keep getting it because the virus stays in your body.  Contact dermatitis. Contact dermatitis is an irritation of the vulva that can cause redness, swelling and itching. It can be caused by:  Perfumed toilet paper.  Deodorants.  Talcum powder.  Hygiene sprays.  Tampons.  Soaps and fabric softeners.  Wearing wet underwear and bathing suits too long.  Spermicide.  Condoms.  Diaphragms.  Poison ivy. Treatment will depend on eliminating the cause and treating the symptoms.  Vulvodynia.  Vulvodynia is "painful vulva." It includes burning, itching, irritation and a feeling of rawness or bruising of the vulva. Treating this problem depends on the cause and diagnosis. Treatment can take a long time.  Vulvar Dystrophy.  Vulvar Dystrophy is a disorder of the skin of the vulva. The skin can be too thick with hard raised patches or too thin skin showing wrinkles. Vulvar dystrophy can cause redness or white patches, itching and burning sensation. A biopsy may be needed to diagnose the problem. Treatment with creams or ointments depends on the diagnosis.  Vulvar Cancer.  Vulvar cancer is usually a form of squamus skin cancer. Other types of vulvar cancer like melenoma (a dark or black, irregular shaped mole that can bleed easily) and adenocarcinoma (red, itchy, scaly area that looks like eczema) are rare. Treatment is usually surgery to remove the cancerous area, the vulva and the lymph nodes in the groin. This can be done with or without radiation and chemotherapy. HOME  CARE INSTRUCTIONS   Look at your vulva and external genital area every month.  Follow and finish all treatment given to you by your caregiver.  Do not use perfumed or scented soaps, detergents, hygiene spray, talcum powder, tampons, douches or toilet paper.  Remove your tampon every 6  hours. Do not leave tampons in overnight.  Wear loose-fitting clothing.  Wear underwear that is cotton.  Avoid spermicidal creams or foams that irritate you. SEEK MEDICAL CARE IF:   You notice changes on your vulva such as redness, swelling, itching, color changes, bleeding, small bumps, blisters or any discomfort.  You develop an abnormal vaginal discharge.  You have painful sexual intercourse.  You notice swelling of the lymph nodes in your groin. Document Released: 11/01/2007 Document Revised: 08/07/2011 Document Reviewed: 08/15/2010 [facility] Patient Information 2013 [place].

## 2012-08-20 DIAGNOSIS — M75120 Complete rotator cuff tear or rupture of unspecified shoulder, not specified as traumatic: Secondary | ICD-10-CM | POA: Diagnosis not present

## 2012-08-20 DIAGNOSIS — M25519 Pain in unspecified shoulder: Secondary | ICD-10-CM | POA: Diagnosis not present

## 2012-08-22 DIAGNOSIS — M75120 Complete rotator cuff tear or rupture of unspecified shoulder, not specified as traumatic: Secondary | ICD-10-CM | POA: Diagnosis not present

## 2012-08-22 DIAGNOSIS — M25519 Pain in unspecified shoulder: Secondary | ICD-10-CM | POA: Diagnosis not present

## 2012-08-26 ENCOUNTER — Ambulatory Visit (HOSPITAL_COMMUNITY)
Admission: RE | Admit: 2012-08-26 | Discharge: 2012-08-26 | Disposition: A | Discharge status: Home / Self Care | Payer: Medicare Other | Source: Ambulatory Visit | Attending: Internal Medicine | Admitting: Internal Medicine

## 2012-08-26 DIAGNOSIS — M75120 Complete rotator cuff tear or rupture of unspecified shoulder, not specified as traumatic: Secondary | ICD-10-CM | POA: Diagnosis not present

## 2012-08-26 DIAGNOSIS — M25519 Pain in unspecified shoulder: Secondary | ICD-10-CM | POA: Diagnosis not present

## 2012-08-26 DIAGNOSIS — Z1231 Encounter for screening mammogram for malignant neoplasm of breast: Secondary | ICD-10-CM | POA: Diagnosis not present

## 2012-08-27 ENCOUNTER — Other Ambulatory Visit: Payer: Self-pay | Admitting: Internal Medicine

## 2012-08-27 DIAGNOSIS — R928 Other abnormal and inconclusive findings on diagnostic imaging of breast: Secondary | ICD-10-CM

## 2012-08-29 ENCOUNTER — Ambulatory Visit
Admission: RE | Admit: 2012-08-29 | Discharge: 2012-08-29 | Disposition: A | Discharge status: Home / Self Care | Payer: Medicare Other | Source: Ambulatory Visit | Attending: Internal Medicine | Admitting: Internal Medicine

## 2012-08-29 ENCOUNTER — Other Ambulatory Visit: Payer: Self-pay | Admitting: Internal Medicine

## 2012-08-29 DIAGNOSIS — R928 Other abnormal and inconclusive findings on diagnostic imaging of breast: Secondary | ICD-10-CM

## 2012-09-03 DIAGNOSIS — M75120 Complete rotator cuff tear or rupture of unspecified shoulder, not specified as traumatic: Secondary | ICD-10-CM | POA: Diagnosis not present

## 2012-09-03 DIAGNOSIS — M25519 Pain in unspecified shoulder: Secondary | ICD-10-CM | POA: Diagnosis not present

## 2012-09-04 ENCOUNTER — Ambulatory Visit
Admission: RE | Admit: 2012-09-04 | Discharge: 2012-09-04 | Disposition: A | Discharge status: Home / Self Care | Payer: Medicare Other | Source: Ambulatory Visit | Attending: Internal Medicine | Admitting: Internal Medicine

## 2012-09-04 DIAGNOSIS — R928 Other abnormal and inconclusive findings on diagnostic imaging of breast: Secondary | ICD-10-CM

## 2012-09-04 DIAGNOSIS — N6489 Other specified disorders of breast: Secondary | ICD-10-CM | POA: Diagnosis not present

## 2012-09-04 DIAGNOSIS — N63 Unspecified lump in unspecified breast: Secondary | ICD-10-CM | POA: Diagnosis not present

## 2012-09-04 DIAGNOSIS — C50919 Malignant neoplasm of unspecified site of unspecified female breast: Secondary | ICD-10-CM | POA: Diagnosis not present

## 2012-09-04 HISTORY — PX: BREAST BIOPSY: SHX20

## 2012-09-05 ENCOUNTER — Ambulatory Visit
Admission: RE | Admit: 2012-09-05 | Discharge: 2012-09-05 | Disposition: A | Discharge status: Home / Self Care | Payer: Medicare Other | Source: Ambulatory Visit | Attending: Internal Medicine | Admitting: Internal Medicine

## 2012-09-05 ENCOUNTER — Other Ambulatory Visit: Payer: Self-pay | Admitting: Internal Medicine

## 2012-09-05 DIAGNOSIS — N63 Unspecified lump in unspecified breast: Secondary | ICD-10-CM

## 2012-09-05 DIAGNOSIS — M25519 Pain in unspecified shoulder: Secondary | ICD-10-CM | POA: Diagnosis not present

## 2012-09-05 DIAGNOSIS — M75120 Complete rotator cuff tear or rupture of unspecified shoulder, not specified as traumatic: Secondary | ICD-10-CM | POA: Diagnosis not present

## 2012-09-06 ENCOUNTER — Telehealth: Payer: Self-pay | Admitting: *Deleted

## 2012-09-06 DIAGNOSIS — C50111 Malignant neoplasm of central portion of right female breast: Secondary | ICD-10-CM | POA: Insufficient documentation

## 2012-09-06 NOTE — Telephone Encounter (Signed)
Confirmed [facility] for 09/11/12 at 1200 .  Instructions and contact information given.

## 2012-09-10 DIAGNOSIS — M75120 Complete rotator cuff tear or rupture of unspecified shoulder, not specified as traumatic: Secondary | ICD-10-CM | POA: Diagnosis not present

## 2012-09-10 DIAGNOSIS — M25519 Pain in unspecified shoulder: Secondary | ICD-10-CM | POA: Diagnosis not present

## 2012-09-11 ENCOUNTER — Encounter: Payer: Self-pay | Admitting: *Deleted

## 2012-09-11 ENCOUNTER — Ambulatory Visit
Admission: RE | Admit: 2012-09-11 | Discharge: 2012-09-11 | Disposition: A | Discharge status: Home / Self Care | Payer: Medicare Other | Source: Ambulatory Visit | Attending: Radiation Oncology | Admitting: Radiation Oncology

## 2012-09-11 ENCOUNTER — Encounter: Payer: Self-pay | Admitting: Radiation Oncology

## 2012-09-11 ENCOUNTER — Other Ambulatory Visit (HOSPITAL_BASED_OUTPATIENT_CLINIC_OR_DEPARTMENT_OTHER): Payer: Medicare Other | Admitting: Lab

## 2012-09-11 ENCOUNTER — Encounter: Payer: Self-pay | Admitting: Oncology

## 2012-09-11 ENCOUNTER — Telehealth: Payer: Self-pay | Admitting: Oncology

## 2012-09-11 ENCOUNTER — Ambulatory Visit: Payer: Medicare Other

## 2012-09-11 ENCOUNTER — Encounter (INDEPENDENT_AMBULATORY_CARE_PROVIDER_SITE_OTHER): Payer: Self-pay | Admitting: General Surgery

## 2012-09-11 ENCOUNTER — Ambulatory Visit (HOSPITAL_BASED_OUTPATIENT_CLINIC_OR_DEPARTMENT_OTHER): Payer: Medicare Other | Admitting: Oncology

## 2012-09-11 ENCOUNTER — Encounter: Payer: Self-pay | Admitting: Specialist

## 2012-09-11 ENCOUNTER — Ambulatory Visit (HOSPITAL_BASED_OUTPATIENT_CLINIC_OR_DEPARTMENT_OTHER): Payer: Medicare Other | Admitting: General Surgery

## 2012-09-11 ENCOUNTER — Telehealth: Payer: Self-pay | Admitting: *Deleted

## 2012-09-11 VITALS — BP 119/58 | HR 49 | Temp 97.3°F | Resp 20 | Ht 67.0 in | Wt 216.4 lb

## 2012-09-11 DIAGNOSIS — C50111 Malignant neoplasm of central portion of right female breast: Secondary | ICD-10-CM

## 2012-09-11 DIAGNOSIS — E785 Hyperlipidemia, unspecified: Secondary | ICD-10-CM

## 2012-09-11 DIAGNOSIS — R319 Hematuria, unspecified: Secondary | ICD-10-CM | POA: Diagnosis not present

## 2012-09-11 DIAGNOSIS — C50119 Malignant neoplasm of central portion of unspecified female breast: Secondary | ICD-10-CM | POA: Diagnosis not present

## 2012-09-11 DIAGNOSIS — I10 Essential (primary) hypertension: Secondary | ICD-10-CM | POA: Diagnosis not present

## 2012-09-11 DIAGNOSIS — C50919 Malignant neoplasm of unspecified site of unspecified female breast: Secondary | ICD-10-CM

## 2012-09-11 DIAGNOSIS — C50911 Malignant neoplasm of unspecified site of right female breast: Secondary | ICD-10-CM

## 2012-09-11 DIAGNOSIS — N390 Urinary tract infection, site not specified: Secondary | ICD-10-CM | POA: Diagnosis not present

## 2012-09-11 LAB — COMPREHENSIVE METABOLIC PANEL (CC13)
ALT: 13 U/L (ref 0–55)
AST: 16 U/L (ref 5–34)
Albumin: 3.3 g/dL — ABNORMAL LOW (ref 3.5–5.0)
Alkaline Phosphatase: 64 U/L (ref 40–150)
BUN: 21.5 mg/dL (ref 7.0–26.0)
CO2: 26 mEq/L (ref 22–29)
Calcium: 9.2 mg/dL (ref 8.4–10.4)
Chloride: 104 mEq/L (ref 98–107)
Creatinine: 1.3 mg/dL — ABNORMAL HIGH (ref 0.6–1.1)
Glucose: 73 mg/dl (ref 70–99)
Potassium: 3.5 mEq/L (ref 3.5–5.1)
Sodium: 139 mEq/L (ref 136–145)
Total Bilirubin: 0.32 mg/dL (ref 0.20–1.20)
Total Protein: 7.4 g/dL (ref 6.4–8.3)

## 2012-09-11 LAB — CBC WITH DIFFERENTIAL/PLATELET
BASO%: 0.7 % (ref 0.0–2.0)
Basophils Absolute: 0 10*3/uL (ref 0.0–0.1)
EOS%: 1.6 % (ref 0.0–7.0)
Eosinophils Absolute: 0.1 10*3/uL (ref 0.0–0.5)
HCT: 36.7 % (ref 34.8–46.6)
HGB: 11.9 g/dL (ref 11.6–15.9)
LYMPH%: 43.6 % (ref 14.0–49.7)
MCH: 27.1 pg (ref 25.1–34.0)
MCHC: 32.4 g/dL (ref 31.5–36.0)
MCV: 83.6 fL (ref 79.5–101.0)
MONO#: 0.8 10*3/uL (ref 0.1–0.9)
MONO%: 12.4 % (ref 0.0–14.0)
NEUT#: 2.7 10*3/uL (ref 1.5–6.5)
NEUT%: 41.7 % (ref 38.4–76.8)
Platelets: 173 10*3/uL (ref 145–400)
RBC: 4.39 10*6/uL (ref 3.70–5.45)
RDW: 14.3 % (ref 11.2–14.5)
WBC: 6.5 10*3/uL (ref 3.9–10.3)
lymph#: 2.8 10*3/uL (ref 0.9–3.3)

## 2012-09-11 NOTE — Patient Instructions (Signed)
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  You will be scheduled for bilateral breast MRI to make sure there are no other suspicious areas before we do any treatment.  We will contact Dr. Terrence Dupont and ask permission to proceed with surgery and to take you off of the pradaxa for 5-6 days preop to lower the risk of bleeding.  The next step in your treatment is surgery, and you'll be scheduled for a right partial mastectomy with needle localization, right axillary sentinel node biopsy, and Port-A-Cath insertion.     Lumpectomy, Breast Conserving Surgery A lumpectomy is breast surgery that removes only part of the breast. Another name used may be partial mastectomy. The amount removed varies. Make sure you understand how much of your breast will be removed. Reasons for a lumpectomy:  Any solid breast mass.  Grouped significant nodularity that may be confused with a solitary breast mass. Lumpectomy is the most common form of breast cancer surgery today. The surgeon removes the portion of your breast which contains the tumor (cancer). This is the lump. Some normal tissue around the lump is also removed to be sure that all the tumor has been removed.  If cancer cells are found in the margins where the breast tissue was removed, your surgeon will do more surgery to remove the remaining cancer tissue. This is called re-excision surgery. Radiation and/or chemotherapy treatments are often given following a lumpectomy to kill any cancer cells that could possibly remain.  REASONS YOU MAY NOT BE ABLE TO HAVE BREAST CONSERVING SURGERY:  The tumor is located in more than one place.  Your breast is small and the tumor is large so the breast would be disfigured.  The entire tumor removal is not successful with a lumpectomy.  You cannot commit to a  full course of chemotherapy, radiation therapy or are pregnant and cannot have radiation.  You have previously had radiation to the breast to treat cancer. HOW A LUMPECTOMY IS PERFORMED If overnight nursing is not required following a biopsy, a lumpectomy can be performed as a same-day surgery. This can be done in a hospital, clinic, or surgical center. The anesthesia used will depend on your surgeon. They will discuss this with you. A general anesthetic keeps you sleeping through the procedure. LET YOUR CAREGIVERS KNOW ABOUT THE FOLLOWING:  Allergies  Medications taken including herbs, eye drops, over the counter medications, and creams.  Use of steroids (by mouth or creams)  Previous problems with anesthetics or Novocaine.  Possibility of pregnancy, if this applies  History of blood clots (thrombophlebitis)  History of bleeding or blood problems.  Previous surgery  Other health problems BEFORE THE PROCEDURE You should be present one hour prior to your procedure unless directed otherwise.  AFTER THE PROCEDURE  After surgery, you will be taken to the recovery area where a nurse will watch and check your progress. Once you're awake, stable, and taking fluids well, barring other problems you will be allowed to go home.  Ice packs applied to your operative site may help with discomfort and keep the swelling down.  A small rubber drain may be placed in the breast for a couple of days to prevent a hematoma from developing in the breast.  A pressure dressing may be applied for 24 to 48 hours to prevent bleeding.  Keep the wound dry.  You may resume a normal diet and activities as directed. Avoid strenuous activities affecting the arm on the side of the biopsy site such as tennis, swimming, heavy lifting (more than 10 pounds) or pulling.  Bruising in the breast is normal following this procedure.  Wearing a bra - even to bed - may be more comfortable and also help keep the dressing  on.  Change dressings as directed.  Only take over-the-counter or prescription medicines for pain, discomfort, or fever as directed by your caregiver. Call for your results as instructed by your surgeon. Remember it is your responsibility to get the results of your lumpectomy if your surgeon asked you to follow-up. Do not assume everything is fine if you have not heard from your caregiver. SEEK MEDICAL CARE IF:   There is increased bleeding (more than a small spot) from the wound.  You notice redness, swelling, or increasing pain in the wound.  Pus is coming from wound.  An unexplained oral temperature above 102 F (38.9 C) develops.  You notice a foul smell coming from the wound or dressing. SEEK IMMEDIATE MEDICAL CARE IF:   You develop a rash.  You have difficulty breathing.  You have any allergic problems. Document Released: 06/26/2006 Document Revised: 08/07/2011 Document Reviewed: 09/27/2006 [facility] Patient Information 2013 [place].       Implanted Port Instructions An implanted port is a central line that has a round shape and is placed under the skin. It is used for long-term IV (intravenous) access for:  Medicine.  Fluids.  Liquid nutrition, such as TPN (total parenteral nutrition).  Blood samples. Ports can be placed:  In the chest area just below the collarbone (this is the most common place.)  In the arms.  In the belly (abdomen) area.  In the legs. PARTS OF THE PORT A port has 2 main parts:  The reservoir. The reservoir is round, disc-shaped, and will be a small, raised area under your skin.  The reservoir is the part where a needle is inserted (accessed) to either give medicines or to draw blood.  The catheter. The catheter is a long, slender tube that extends from the reservoir. The catheter is placed into a large vein.  Medicine that is inserted into the reservoir goes into the catheter and then into the vein. INSERTION OF THE  PORT  The port is surgically placed in either an operating room or in a procedural area (interventional radiology).  Medicine may be given to help you relax during the procedure.  The skin where the port will be inserted is numbed (local anesthetic).  1 or 2 small cuts (incisions) will be made in the skin to insert the port.  The port can be used after it has been inserted. INCISION SITE CARE  The incision site may have small adhesive strips on it. This helps keep the incision site closed. Sometimes, no adhesive strips are placed. Instead of adhesive strips, a special kind of surgical glue is used to keep the incision closed.  If adhesive strips were placed on the incision sites, do not take them off. They will fall off on their own.  The incision site may be sore for 1 to 2 days. Pain medicine can help.  Do not get the incision site wet. Bathe or shower as directed by your caregiver.  The incision site should heal in 5 to 7 days. A small scar may form after the incision has healed. ACCESSING THE PORT Special steps must be  taken to access the port:  Before the port is accessed, a numbing cream can be placed on the skin. This helps numb the skin over the port site.  A sterile technique is used to access the port.  The port is accessed with a needle. Only "non-coring" port needles should be used to access the port. Once the port is accessed, a blood return should be checked. This helps ensure the port is in the vein and is not clogged (clotted).  If your caregiver believes your port should remain accessed, a clear (transparent) bandage will be placed over the needle site. The bandage and needle will need to be changed every week or as directed by your caregiver.  Keep the bandage covering the needle clean and dry. Do not get it wet. Follow your caregiver's instructions on how to take a shower or bath when the port is accessed.  If your port does not need to stay accessed, no bandage  is needed over the port. FLUSHING THE PORT Flushing the port keeps it from getting clogged. How often the port is flushed depends on:  If a constant infusion is running. If a constant infusion is running, the port may not need to be flushed.  If intermittent medicines are given.  If the port is not being used. For intermittent medicines:  The port will need to be flushed:  After medicines have been given.  After blood has been drawn.  As part of routine maintenance.  A port is normally flushed with:  Normal saline.  Heparin.  Follow your caregiver's advice on how often, how much, and the type of flush to use on your port. IMPORTANT PORT INFORMATION  Tell your caregiver if you are allergic to heparin.  After your port is placed, you will get a manufacturer's information card. The card has information about your port. Keep this card with you at all times.  There are many types of ports available. Know what kind of port you have.  In case of an emergency, it may be helpful to wear a medical alert bracelet. This can help alert health care workers that you have a port.  The port can stay in for as long as your caregiver believes it is necessary.  When it is time for the port to come out, surgery will be done to remove it. The surgery will be similar to how the port was put in.  If you are in the hospital or clinic:  Your port will be taken care of and flushed by a nurse.  If you are at home:  A home health care nurse may give medicines and take care of the port.  You or a family member can get special training and directions for giving medicine and taking care of the port at home. SEEK IMMEDIATE MEDICAL CARE IF:   Your port does not flush or you are unable to get a blood return.  New drainage or pus is coming from the incision.  A bad smell is coming from the incision site.  You develop swelling or increased redness at the incision site.  You develop increased  swelling or pain at the port site.  You develop swelling or pain in the surrounding skin near the port.  You have an oral temperature above 102 F (38.9 C), not controlled by medicine. MAKE SURE YOU:   Understand these instructions.  Will watch your condition.  Will get help right away if you are not doing well or  get worse. Document Released: 05/15/2005 Document Revised: 08/07/2011 Document Reviewed: 08/06/2008 [facility] Patient Information 2013 [place].      Edsel Petrin. Dalbert Batman, M.D., [facility] General and Minimally invasive Surgery Breast and Colorectal Surgery Office:   [phone] Pager:   [phone]

## 2012-09-11 NOTE — Progress Notes (Addendum)
Patient ID: Diane D Centola, female   DOB: 26-Oct-1940, 72 y.o.   MRN: [id number]  No chief complaint on file.   HPI Diane [name] is a 72 y.o. female.  She is referred by Dr. 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 She is being evaluated in the breast [place] today by Dr. Jana Hakim, Dr. Isidore Moos, and me. Dr. Amil Amen is her primary care physician and Dr. Terrence Dupont is her cardiologist.  She has no prior history of breast disease. 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 Alycia Rossetti. She went for her annual screening mammograms and mammograms and ultrasound showed a 9 mm mass in the 12:00 position of the right breast centrally. This is a 11 cm from the nipple. The left breast looks normal. Image guided biopsy showed invasive ductal carcinoma and DCIS, grade 3, triple negative breast cancer, T6-T7 70%. She has had no further imaging studies. We discussed her case and breast conference this morning. I am coordinating her treatment plan with Dr. Jana Hakim and Dr. Isidore Moos today.  Past history reveals squamous cell carcinoma of the vulva. Irregular heartbeat possible atrial fibrillation on Pradaxa.    Hypertension. Coronary artery disease. TAH/BSO/partial vulvectomy.   Family history is negative for breast or ovarian or prostate cancer. HPI  Past Medical History  Diagnosis Date  . Lower back pain   . Herniated disc   . Arthritis   . Lumbar stenosis     L4-5  . Spondylolysis   . Coronary artery disease   . Hypertension   . Hyperlipemia   . 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 Breast cancer   . Diabetes mellitus without complication   . Depression     Past Surgical History  Procedure Laterality Date  . Wide local excision  01/2010    Bilateral groin excisions, re-excision of vulva  . Posterior lumbar  arthrodesis, pedicle screw fixation, posterolateral arthodesis  03/17/11  . Tonsillectomy    . Abdominal hysterectomy    . Dilation and curettage of uterus    . Tubal ligation    . Colonoscopy    . Shoulder arthroscopy with rotator cuff repair and subacromial decompression  06/04/2012    Procedure: SHOULDER ARTHROSCOPY WITH ROTATOR CUFF REPAIR AND SUBACROMIAL DECOMPRESSION;  Surgeon: Nita Sells, MD;  Location: [facility];  Service: Orthopedics;  Laterality: Left;  LEFT SHOULDER ARTHROSCOPY WITH ROTATOR CUFF REPAIR AND SUBACROMIAL DECOMPRESSION AND DISTAL CLAVICLE RESECTION    Family History  Problem Relation Age of Onset  . Stroke Mother   . Alzheimer's disease Mother   . Heart attack Father   . Heart attack Brother     Social History History  Substance Use Topics  . Smoking status: Former Smoker -- 50 years    Types: Cigarettes    Quit date: 05/29/2009  . Smokeless tobacco: Not on file  . Alcohol Use: No    Allergies  Allergen Reactions  . Shellfish Allergy Swelling    Current Outpatient Prescriptions  Medication Sig Dispense Refill  . cephALEXin (KEFLEX) 500 MG capsule Take 1 capsule (500 mg total) by mouth 3 (three) times daily.  30 capsule  0  . dabigatran (PRADAXA) 75 MG CAPS Take 75 mg by mouth every 12 (twelve) hours.      . Ferrous Sulfate (  IRON) 325 (65 FE) MG TABS Take 1 tablet by mouth daily. OTC      . FLUoxetine (PROZAC) 10 MG capsule Take 10 mg by mouth daily.      . nebivolol (BYSTOLIC) 5 MG tablet Take 5 mg by mouth daily.      [name] Kitchen oxyCODONE-acetaminophen (ROXICET) 5-325 MG per tablet Take 1-2 tablets by mouth every 4 (four) hours as needed for pain.  60 tablet  0  . phenazopyridine (PYRIDIUM) 200 MG tablet Take 1 tablet (200 mg total) by mouth 3 (three) times daily as needed for pain.  15 tablet  0  . pravastatin (PRAVACHOL) 40 MG tablet Take 40 mg by mouth every evening.      . TRAZODONE HCL PO Take 1 tablet by mouth daily.      .  Triamterene-HCTZ (DYAZIDE PO) Take 1 tablet by mouth daily. 37.5/25 mg       No current facility-administered medications for this visit.    Review of Systems Review of Systems  Constitutional: Negative for fever, chills and unexpected weight change.  HENT: Negative for hearing loss, congestion, sore throat, trouble swallowing and voice change.   Eyes: Negative for visual disturbance.  Respiratory: Negative for cough and wheezing.   Cardiovascular: Negative for chest pain, palpitations and leg swelling.  Gastrointestinal: Negative for nausea, vomiting, abdominal pain, diarrhea, constipation, blood in stool, abdominal distention and anal bleeding.  Genitourinary: Negative for hematuria, vaginal bleeding and difficulty urinating.  Musculoskeletal: Negative for arthralgias.  Skin: Negative for rash and wound.  Neurological: Negative for seizures, syncope and headaches.  Hematological: Negative for adenopathy. Does not bruise/bleed easily.  Psychiatric/Behavioral: Negative for confusion.    There were no vitals taken for this visit.  Physical Exam Physical Exam  Constitutional: She is oriented to person, place, and time. She appears well-developed and well-nourished. No distress.  HENT:  Head: Normocephalic and atraumatic.  Nose: Nose normal.  Mouth/Throat: No oropharyngeal exudate.  Eyes: Conjunctivae and EOM are normal. Pupils are equal, round, and reactive to light. Left eye exhibits no discharge. No scleral icterus.  Neck: Neck supple. No JVD present. No tracheal deviation present. No thyromegaly present.  Cardiovascular: Normal rate, regular rhythm, normal heart sounds and intact distal pulses.   No murmur heard. Pulmonary/Chest: Effort normal and breath sounds normal. No respiratory distress. She has no wheezes. She has no rales. She exhibits no tenderness.  Breasts are very large and somewhat pendulous. Biopsy site right breast at 11:00 position. No hematoma. No mass in either  breast. No skin change bilaterally. No axillary adenopathy bilaterally.  Abdominal: Soft. Bowel sounds are normal. She exhibits no distension and no mass. There is no tenderness. There is no rebound and no guarding.  Lower abdominal incision from hysterectomy.  Musculoskeletal: She exhibits no edema and no tenderness.  Lymphadenopathy:    She has no cervical adenopathy.  Neurological: She is alert and oriented to person, place, and time. She exhibits normal muscle tone. Coordination normal.  Skin: Skin is warm. No rash noted. She is not diaphoretic. No erythema. No pallor.  Psychiatric: She has a normal mood and affect. Her behavior is normal. Judgment and thought content normal.    Data Reviewed I have reviewed all of her imaging findings and her histology and breast diagnostic profile. I have coordinated her treatment plan with Dr. Jana Hakim and Dr. Isidore Moos. I have requested bilateral breast MRI. I have requested cardiac clearance with Dr. 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    Clinical stage T1b, N0,    9 mm tumor.   Patient desires breast conservation, if possible  Coronary artery disease and arrhythmia, on pradaxa, followed by Dr. Terrence Dupont.  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 NED at present.    Plan    Dr. Jana Hakim has recommended, and the patient agrees to adjuvant chemotherapy  The patient will be scheduled for bilateral breast MRI because she is a triple negative breast cancer Addendum(09/24/2012): Bilateral breast MRI shows this to be a solitary finding. No other areas of concern. Patient will be notified.  We will ask Dr. Terrence Dupont for cardiac clearance and permission to take her off her Pradaxa for 5 days prior to her breast surgery and Port-A-Cath placement  I had a long talk with the  patient and her daughter about options for surgical intervention. We talked about lumpectomy, mastectomy, sentinel biopsy, Port-A-Cath insertion. We talked about cosmetic issues, reconstructive issues, complications. I told her that there did not appear to be an oncologic advantage to mastectomy, and she would very much like to keep her breast if possible. Considering the large size of her breasts, I think this would be better for her.She is aware that she will need radiation therapy postop.  After receiving cardiac clearance, and assuming the MRI shows no other disease, she will be scheduled for a right partial mastectomy with needle localization, right axillary sentinel node biopsy, and Port-A-Cath insertion with C-arm and ultrasound.  I discussed the indications, details, techniques, and numerous risks of the surgery with the patient and her daughter. They understand all these issues and all their questions are answered. They are in full agreement with this plan.   Edsel Petrin. Dalbert Batman, M.D., [facility] General and Minimally invasive Surgery Breast and Colorectal Surgery Office:   [phone] Pager:   [phone]        Adin Hector 09/11/2012, 3:07 PM

## 2012-09-11 NOTE — Progress Notes (Signed)
Checked in new pt with no financial concerns. °

## 2012-09-11 NOTE — Progress Notes (Signed)
ID: Diane [name]   DOB: 29-May-1941  MR#: [id number]  CSN#:[id number]  PCP: Myrtis Ser, MD GYN:  SU:  OTHER MD: [name], Nancy Marus, [name] Sikora   HISTORY OF PRESENT ILLNESS: Diane had routine screening mammography at [facility] hospital 08/26/2012 showing a possible distortion in the right breast. Additional views of the breast Center 08/29/2012 confirmed a spiculated mass in the right breast measuring approximately 9 mm. This was not palpable. Ultrasound confirmed an irregular mass in the area in question, again measuring 9 mm. The right axilla was benign.  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 The patient's subsequent history is as detailed below.  INTERVAL HISTORY: The patient was seen in the multidisciplinary breast cancer clinic for 16 2014 accompanied by her daughter Diane [name].  REVIEW OF SYSTEMS: The patient tolerated the biopsy well. She is on chronic anticoagulation for an irregular heartbeat, but has had no bleeding problems. She also has diabetes but she tells me this has been well-controlled on no medication. She has occasional night sweats. She has sleep disturbance. She is worried about her weight. Occasionally she has crampy muscle aches and has a history of back pain, dating back to her surgery about 3 years ago. She does have arthritis and joint pain. This is not more intense or constant than prior. She has mild sinus symptoms. She can be sure to rest of particularly when walking up a slope or climbing stairs. She sleeps on 3 pillows. She has a history of depression. A detailed review of systems was otherwise stable.  PAST MEDICAL HISTORY: Past Medical History  Diagnosis Date  . Lower back pain   . Herniated disc   . Arthritis   . Lumbar stenosis     L4-5  . Spondylolysis   . Coronary artery disease   . Hypertension   . Hyperlipemia   . 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 Breast cancer   . Diabetes mellitus without complication   . Depression     PAST SURGICAL HISTORY: Past Surgical History  Procedure Laterality Date  . Wide local excision  01/2010    Bilateral groin excisions, re-excision of vulva  . Posterior lumbar arthrodesis, pedicle screw fixation, posterolateral arthodesis  03/17/11  . Tonsillectomy    . Abdominal hysterectomy    . Dilation and curettage of uterus    . Tubal ligation    . Colonoscopy    . Shoulder arthroscopy with rotator cuff repair and subacromial decompression  06/04/2012    Procedure: SHOULDER ARTHROSCOPY WITH ROTATOR CUFF REPAIR AND SUBACROMIAL DECOMPRESSION;  Surgeon: Nita Sells, MD;  Location: [place];  Service: Orthopedics;  Laterality: Left;  LEFT SHOULDER ARTHROSCOPY WITH ROTATOR CUFF REPAIR AND SUBACROMIAL DECOMPRESSION AND DISTAL CLAVICLE RESECTION    FAMILY HISTORY Family History  Problem Relation Age of Onset  . Stroke Mother   . Alzheimer's disease Mother   . Heart attack Father   . Heart attack Brother    the patient's father died from a heart attack at the age of 66. The patient's mother died at age 63 in the setting of Alzheimer's disease. Diane had 4 brothers, 3 sisters. There is no history of breast or ovarian cancer in the family to her knowledge.  GYNECOLOGIC HISTORY: Menarche age 27, first live birth age 48, she is [place] P3. She had a total abdominal hysterectomy with bilateral salpingo-oophorectomy  in 1988. She took estrogen for approximately 30 years, stopping in January 2013.  SOCIAL HISTORY: [name] used to work for [employer]. When she retired she moved to [facility], which is for her husband was from. He died approximately 11 years ago. The patient currently lives alone. Her daughter Diane [name] "hovers over many". Diane [name] works at [employer] on [employer]. The other 2 children are daughter Diane [name],  who lives in [place] and works in a hospital, and Diane [name], who lives in [place] and is a housewife. The patient has 5 grandchildren. She is a Furniture conservator/restorer.  ADVANCED DIRECTIVES: Not in place  HEALTH MAINTENANCE: History  Substance Use Topics  . Smoking status: Former Smoker -- 50 years    Types: Cigarettes    Quit date: 05/29/2009  . Smokeless tobacco: Not on file  . Alcohol Use: No     Colonoscopy:  PAP:  Bone density: June 2011 at [facility]; normal  Lipid panel:  Allergies  Allergen Reactions  . Shellfish Allergy Swelling    Current Outpatient Prescriptions  Medication Sig Dispense Refill  . cephALEXin (KEFLEX) 500 MG capsule Take 1 capsule (500 mg total) by mouth 3 (three) times daily.  30 capsule  0  . dabigatran (PRADAXA) 75 MG CAPS Take 75 mg by mouth every 12 (twelve) hours.      . Ferrous Sulfate (IRON) 325 (65 FE) MG TABS Take 1 tablet by mouth daily. OTC      . FLUoxetine (PROZAC) 10 MG capsule Take 10 mg by mouth daily.      . nebivolol (BYSTOLIC) 5 MG tablet Take 5 mg by mouth daily.      [name] Kitchen oxyCODONE-acetaminophen (ROXICET) 5-325 MG per tablet Take 1-2 tablets by mouth every 4 (four) hours as needed for pain.  60 tablet  0  . phenazopyridine (PYRIDIUM) 200 MG tablet Take 1 tablet (200 mg total) by mouth 3 (three) times daily as needed for pain.  15 tablet  0  . pravastatin (PRAVACHOL) 40 MG tablet Take 40 mg by mouth every evening.      . TRAZODONE HCL PO Take 1 tablet by mouth daily.      . Triamterene-HCTZ (DYAZIDE PO) Take 1 tablet by mouth daily. 37.5/25 mg       No current facility-administered medications for this visit.    OBJECTIVE: Middle-aged [place] American woman in no acute distress Filed Vitals:   09/11/12 1316  BP: 119/58  Pulse: 49  Temp: 97.3 F (36.3 C)  Resp: 20     Body mass index is 33.88 kg/(m^2).    ECOG FS: 1  Sclerae unicteric Oropharynx clear No cervical or supraclavicular  adenopathy Lungs no rales or rhonchi Heart regular rate and rhythm Abd soft, obese, nontender, positive bowel sounds MSK no focal spinal tenderness, no peripheral edema Neuro: nonfocal, well oriented, friendly affect Breasts: The right breast is status post recent biopsy. There is no significant ecchymosis. There is no palpable mass. There are no skin changes or nipple changes of concern. The right axilla is benign. The left breast is unremarkable.   LAB RESULTS: Lab Results  Component Value Date   WBC 6.5 09/11/2012   NEUTROABS 2.7 09/11/2012   HGB 11.9 09/11/2012   HCT 36.7 09/11/2012   MCV 83.6 09/11/2012   PLT 173 09/11/2012      Chemistry      Component Value Date/Time   NA 139 09/11/2012 1207   NA  140 06/03/2012 1200   K 3.5 09/11/2012 1207   K 4.0 06/03/2012 1200   CL 104 09/11/2012 1207   CL 102 06/03/2012 1200   CO2 26 09/11/2012 1207   CO2 27 06/03/2012 1200   BUN 21.5 09/11/2012 1207   BUN 26* 06/03/2012 1200   CREATININE 1.3* 09/11/2012 1207   CREATININE 1.45* 06/03/2012 1200      Component Value Date/Time   CALCIUM 9.2 09/11/2012 1207   CALCIUM 9.0 06/03/2012 1200   ALKPHOS 64 09/11/2012 1207   ALKPHOS 61 02/04/2010 1200   AST 16 09/11/2012 1207   AST 17 02/04/2010 1200   ALT 13 09/11/2012 1207   ALT 12 02/04/2010 1200   BILITOT 0.32 09/11/2012 1207   BILITOT 0.5 02/04/2010 1200       No results found for this basename: LABCA2    No components found with this basename: [id number]    No results found for this basename: INR,  in the last 168 hours  Urinalysis    Component Value Date/Time   COLORURINE YELLOW 03/09/2011 [place] 03/09/2011 1243   LABSPEC 1.015 06/27/2012 1916   PHURINE 6.0 06/27/2012 [place] 06/27/2012 1916   HGBUR LARGE* 06/27/2012 1916   HGBUR negative 11/04/2009 1005   BILIRUBINUR NEGATIVE 06/27/2012 1916   KETONESUR NEGATIVE 06/27/2012 1916   PROTEINUR 100* 06/27/2012 1916   UROBILINOGEN 0.2 06/27/2012 1916   NITRITE POSITIVE*  06/27/2012 1916   LEUKOCYTESUR MODERATE* 06/27/2012 1916    STUDIES: US Breast Right  08/29/2012  *RADIOLOGY REPORT*  Clinical Data:  The patient returns for evaluation of a possible mass in the 12 o'clock position of the right breast noted on recent screening study dated 08/26/2012.  DIGITAL DIAGNOSTIC RIGHT LIMITED MAMMOGRAM  AND RIGHT BREAST ULTRASOUND:  Comparison:  08/23/2011, 08/22/2010, 08/20/2009, 08/19/2008  Findings:  ACR Breast Density Category 1: The breast tissue is almost entirely fatty.  Additional views confirm the presence of a small spiculated mass in the 12 o'clock position of the right breast, 13 cm from the right nipple.  This measures approximately 9 mm mammographically.  On physical exam, no mass is palpated in the upper portion of the right breast.  Ultrasound is performed, showing an irregular mass at 12 o'clock 11 cm from the right nipple measuring 7 x 9 x 5 mm. Sonography of the right axilla demonstrates no abnormal lymph nodes.  The appearance is suspicious for invasive mammary carcinoma. Biopsy is recommended.  Ultrasound-guided core needle biopsy was discussed with the patient and she agreed with this plan.  IMPRESSION: Suspicious mass at 12 o'clock in the right breast.  RECOMMENDATION: The biopsy is recommended.  Ultrasound-guided core needle biopsy has been scheduled for 09/04/2012 at 1:00 p.m.  I have discussed the findings and recommendations with the patient. Results were also provided in writing at the conclusion of the visit.  If applicable, a reminder letter will be sent to the patient regarding the next appointment.  Report was telephoned to [facility] at Dr. Loistine Chance office.  BI-RADS CATEGORY 4:  Suspicious abnormality - biopsy should be considered.   Original Report Authenticated By: Ulyess Blossom, M.D.    [place] R  08/29/2012  *RADIOLOGY REPORT*  Clinical Data:  The patient returns for evaluation of a possible mass in the 12 o'clock position of the right  breast noted on recent screening study dated 08/26/2012.  DIGITAL DIAGNOSTIC RIGHT LIMITED MAMMOGRAM  AND RIGHT BREAST ULTRASOUND:  Comparison:  08/23/2011,  08/22/2010, 08/20/2009, 08/19/2008  Findings:  ACR Breast Density Category 1: The breast tissue is almost entirely fatty.  Additional views confirm the presence of a small spiculated mass in the 12 o'clock position of the right breast, 13 cm from the right nipple.  This measures approximately 9 mm mammographically.  On physical exam, no mass is palpated in the upper portion of the right breast.  Ultrasound is performed, showing an irregular mass at 12 o'clock 11 cm from the right nipple measuring 7 x 9 x 5 mm. Sonography of the right axilla demonstrates no abnormal lymph nodes.  The appearance is suspicious for invasive mammary carcinoma. Biopsy is recommended.  Ultrasound-guided core needle biopsy was discussed with the patient and she agreed with this plan.  IMPRESSION: Suspicious mass at 12 o'clock in the right breast.  RECOMMENDATION: The biopsy is recommended.  Ultrasound-guided core needle biopsy has been scheduled for 09/04/2012 at 1:00 p.m.  I have discussed the findings and recommendations with the patient. Results were also provided in writing at the conclusion of the visit.  If applicable, a reminder letter will be sent to the patient regarding the next appointment.  Report was telephoned to [facility] at Dr. Loistine Chance office.  BI-RADS CATEGORY 4:  Suspicious abnormality - biopsy should be considered.   Original Report Authenticated By: Ulyess Blossom, M.D.    Mm Digital Diagnostic Unilat R  09/04/2012  *RADIOLOGY REPORT*  Clinical Data:  Status post ultrasound guided core biopsy right breast mass  DIGITAL DIAGNOSTIC RIGHT MAMMOGRAM  Comparison:  Previous exams.  Findings:  Films are performed following ultrasound guided biopsy of hypoechoic mass right breast 12 o'clock.  CC and lateral view of the right breast demonstrates biopsy clip in the area of  concern.  IMPRESSION: Post ultrasound guided core biopsy mammogram demonstrating biopsy clip in the area of concern.   Original Report Authenticated By: Abelardo Diesel, M.D.    Mm Digital Screening  08/27/2012  *RADIOLOGY REPORT*  Clinical Data: Screening.  DIGITAL BILATERAL SCREENING MAMMOGRAM WITH CAD  Comparison: Previous exams.  FINDINGS:  ACR Breast Density Category 2: There is a scattered fibroglandular pattern.  In the right breast, possible distortion warrants further evaluation with spot compression views and possibly ultrasound.  In the left breast, no suspicious masses or malignant type calcifications are identified.  Images were processed with CAD.  IMPRESSION: Further evaluation is suggested for distortion in the right breast.  RECOMMENDATION:  Diagnostic mammogram and possibly ultrasound of the right breast. (Code:[id number])  The patient will be contacted regarding the findings, and additional imaging will be scheduled.  BI-RADS CATEGORY 0:  Incomplete.  Need additional imaging evaluation and/or prior mammograms for comparison.   Original Report Authenticated By: Nolon Nations, M.D.    [facility] Radiologist Eval [facility] Mgmt  09/05/2012  *RADIOLOGY REPORT*  ESTABLISHED PATIENT OFFICE VISIT - LEVEL II [phone])  Chief Complaint:  72 year old female returns for biopsy results following ultrasound guided right breast biopsy on 09/04/2012.  The patient has no complaints with the biopsy site.  History:  Suspicious mass in the upper right breast - biopsied on 09/04/2012.  Exam:  The biopsy site is clean and dry. There is no evidence of hematoma or infection.  Pathology: INVASIVE DUCTAL CARCINOMA.  Histology correlates with imaging findings. These findings were discussed with the patient and she understood the results.  Assessment and Plan:  Surgery/oncology consultation.  The patient has been scheduled at the breast cancer Multidisciplinary Clinic for 09/11/2012.   Original Report Authenticated By: Dellis Filbert  Melanee Spry, M.D.     [place] Rt Breast Bx W Loc Dev 1st Lesion Img Bx Spec US Guide  09/04/2012  *RADIOLOGY REPORT*  Clinical Data:  Suspicious mass right breast for biopsy  ULTRASOUND GUIDED CORE BIOPSY OF THE right BREAST  Comparison: Previous exams.  I met with the patient and we discussed the procedure of ultrasound- guided biopsy, including benefits and alternatives.  We discussed the high likelihood of a successful procedure. We discussed the risks of the procedure, including infection, bleeding, tissue injury, clip migration, and inadequate sampling.  Informed written consent was given.  Using sterile technique  lidocaine, ultrasound guidance and a 14 gauge automated biopsy device, biopsy was performed of hypoechoic lesion right breast 12 o'clock using a lateral approach.  At the conclusion of the procedure a  tissue marker clip was deployed into the biopsy cavity.  Follow up 2 view mammogram was performed and dictated separately.  IMPRESSION: Ultrasound guided biopsy of right breast.  No apparent complications.   Original Report Authenticated By: Abelardo Diesel, M.D.     ASSESSMENT: 72 y.o. [place] woman s/p [place] breast biopsy 08/30/2012 for a clinical T1b No, stage IA invasive ductal carcinoma, grade 3, triple negative, with an MIB-1 of 70%.  PLAN: I spent the better part of today's hour-long visit discussing the specifics of the patient's breast cancer, and general issues regarding treatment options for breast cancer. Diane understands in addition to local therapy she needs systemic treatment because there is a measurable risk that this cancer has a ready spread to other parts of her body and his hygiene care, only to surface after a few years. According to the adjuvant! Database that risk is in the 20% range.  The same database predicts a benefit of chemotherapy in terms of risk reduction between 4 and 6% depending on the chemotherapy chosen. I quoted the patient a 5% risk reduction. We will be using Cytoxan and  Taxotere every 3 weeks x4.  Of course the patient has to have her surgery first, and in addition her daughters are going [place] the [place] around Mother[place]. Tentatively then we plan to start chemotherapy May 19. We did discuss the possible toxicities, side effects and complications of this treatment, and the patient will also come to "chemotherapy school" to reinforce this. She will see my 44 assistant the first week in May to discuss supportive and antinausea medications. After the completion of chemotherapy, the patient will start her radiation treatments, and after that we will start routine followup.  Diane has a good understanding of all this. She is very much in agreement with the plan. She knows to call for any problems that may develop before her next visit here.   MAGRINAT,GUSTAV C    09/11/2012

## 2012-09-11 NOTE — Telephone Encounter (Signed)
Per staff phone call and POF I have schedueld appts.  JMW  

## 2012-09-11 NOTE — Progress Notes (Signed)
I met the patient and her daughter at breast clinic today.  She indicated a distress level of "0" but then talked about feeling anxious about the diagnosis and treatment.  I told her about the available support services, encouraged her to attend support group, and at her request, made a referral to the [employer] program.

## 2012-09-11 NOTE — Progress Notes (Signed)
Radiation Oncology         [phone] ________________________________  Initial outpatient Consultation  Name: Diane [name] MRN: [id number]  Date: 09/11/2012  DOB: 1941-02-09  [id number], MD  Adin Hector, MD   REFERRING PHYSICIAN: Adin Hector, MD  DIAGNOSIS: The encounter diagnosis was Cancer of central portion of female breast, right. T1bN0M0 Right Breast Cancer, IDC, Triple negative, High Grade  HISTORY OF PRESENT ILLNESS::Diane [name] is a 71 y.o. female who was found on screening mammography to have a right posterior breast mass. This was 7x9x45mm on [place] at 12:00.  Bx demonstrated: IDC, Triple negative, Hi Grade. MRI is pending. She denies any new symptoms.  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  She subsequently underwent bilateral groin excisions and re-excision of the vulva in September 2011. All lymph nodes were negative and the re-excision site showed no residual carcinoma. She is still with no evidence of recurrent disease.   PREVIOUS RADIATION THERAPY: No  PAST MEDICAL HISTORY:  has a past medical history of Lower back pain; Herniated disc; Arthritis; Lumbar stenosis; Spondylolysis; Coronary artery disease; Hypertension; Hyperlipemia; Squamous cell carcinoma of vulva; Breast cancer; Diabetes mellitus without complication; and Depression.    PAST SURGICAL HISTORY: Past Surgical History  Procedure Laterality Date  . Wide local excision  01/2010    Bilateral groin excisions, re-excision of vulva  . Posterior lumbar arthrodesis, pedicle screw fixation, posterolateral arthodesis  03/17/11  . Tonsillectomy    . Abdominal hysterectomy    . Dilation and curettage of uterus    . Tubal ligation    . Colonoscopy    . Shoulder arthroscopy with rotator cuff repair and subacromial decompression  06/04/2012    Procedure: SHOULDER ARTHROSCOPY WITH ROTATOR CUFF REPAIR AND  SUBACROMIAL DECOMPRESSION;  Surgeon: Nita Sells, MD;  Location: [place];  Service: Orthopedics;  Laterality: Left;  LEFT SHOULDER ARTHROSCOPY WITH ROTATOR CUFF REPAIR AND SUBACROMIAL DECOMPRESSION AND DISTAL CLAVICLE RESECTION    FAMILY HISTORY: family history includes Alzheimer's disease in her mother; Heart attack in her brother and father; and Stroke in her mother.  SOCIAL HISTORY:  reports that she quit smoking about 3 years ago. Her smoking use included Cigarettes. She smoked 0.00 packs per day for 50 years. She does not have any smokeless tobacco history on file. She reports that she does not drink alcohol or use illicit drugs.  ALLERGIES: Shellfish allergy  MEDICATIONS:  Current Outpatient Prescriptions  Medication Sig Dispense Refill  . cephALEXin (KEFLEX) 500 MG capsule Take 1 capsule (500 mg total) by mouth 3 (three) times daily.  30 capsule  0  . dabigatran (PRADAXA) 75 MG CAPS Take 75 mg by mouth every 12 (twelve) hours.      . Ferrous Sulfate (IRON) 325 (65 FE) MG TABS Take 1 tablet by mouth daily. OTC      . FLUoxetine (PROZAC) 10 MG capsule Take 10 mg by mouth daily.      . nebivolol (BYSTOLIC) 5 MG tablet Take 5 mg by mouth daily.      [name] Kitchen oxyCODONE-acetaminophen (ROXICET) 5-325 MG per tablet Take 1-2 tablets by mouth every 4 (four) hours as needed for pain.  60 tablet  0  . phenazopyridine (PYRIDIUM) 200 MG tablet Take 1 tablet (200 mg total) by mouth 3 (three) times daily as needed for pain.  15 tablet  0  . pravastatin (PRAVACHOL) 40 MG  tablet Take 40 mg by mouth every evening.      . TRAZODONE HCL PO Take 1 tablet by mouth daily.      . Triamterene-HCTZ (DYAZIDE PO) Take 1 tablet by mouth daily. 37.5/25 mg       No current facility-administered medications for this encounter.    REVIEW OF SYSTEMS:    Pertinent items are noted in HPI.   PHYSICAL EXAM:  Vitals - 1 value per visit [id number]  SYSTOLIC [id number]  DIASTOLIC 58  PULSE 49    TEMPERATURE 97.3  RESPIRATIONS 20  Weight (lb) 216.4  HEIGHT 5\' 7"   BMI 33.88  VISIT REPORT   General: Alert and oriented, in no acute distress HEENT: Head is normocephalic. Pupils are equally round and reactive to light. Extraocular movements are intact. Oropharynx is clear. Neck: Neck is supple, no palpable cervical or supraclavicular lymphadenopathy. Heart: Regular in rate and rhythm with no murmurs, rubs, or gallops. Chest: Clear to auscultation bilaterally, with no rhonchi, wheezes, or rales. Abdomen: Soft, nontender, nondistended, with no rigidity or guarding. Extremities: No cyanosis or edema. Lymphatics: No concerning lymphadenopathy. Skin: No concerning lesions. Musculoskeletal: symmetric strength and muscle tone throughout. Decreased ROM  Left arm Neurologic: Cranial nerves II through XII are grossly intact. No obvious focalities. Speech is fluent. Coordination is intact. Psychiatric: Judgment and insight are intact. Affect is appropriate. Breasts: Large Breasts. May benefit from prone position. No palpable lesions in breasts, no axillary adenopathy appreciated   LABORATORY DATA:  Lab Results  Component Value Date   WBC 6.5 09/11/2012   HGB 11.9 09/11/2012   HCT 36.7 09/11/2012   MCV 83.6 09/11/2012   PLT 173 09/11/2012   CMP     Component Value Date/Time   NA 139 09/11/2012 1207   NA 140 06/03/2012 1200   K 3.5 09/11/2012 1207   K 4.0 06/03/2012 1200   CL 104 09/11/2012 1207   CL 102 06/03/2012 1200   CO2 26 09/11/2012 1207   CO2 27 06/03/2012 1200   GLUCOSE 73 09/11/2012 1207   GLUCOSE 101* 06/03/2012 1200   BUN 21.5 09/11/2012 1207   BUN 26* 06/03/2012 1200   CREATININE 1.3* 09/11/2012 1207   CREATININE 1.45* 06/03/2012 1200   CALCIUM 9.2 09/11/2012 1207   CALCIUM 9.0 06/03/2012 1200   PROT 7.4 09/11/2012 1207   PROT 7.4 02/04/2010 1200   ALBUMIN 3.3* 09/11/2012 1207   ALBUMIN 3.9 02/04/2010 1200   AST 16 09/11/2012 1207   AST 17 02/04/2010 1200   ALT 13 09/11/2012 1207   ALT 12  02/04/2010 1200   ALKPHOS 64 09/11/2012 1207   ALKPHOS 61 02/04/2010 1200   BILITOT 0.32 09/11/2012 1207   BILITOT 0.5 02/04/2010 1200   GFRNONAA 36* 06/03/2012 1200   GFRAA 42* 06/03/2012 1200         RADIOGRAPHY: US Breast Right  08/29/2012  *RADIOLOGY REPORT*  Clinical Data:  The patient returns for evaluation of a possible mass in the 12 o'clock position of the right breast noted on recent screening study dated 08/26/2012.  DIGITAL DIAGNOSTIC RIGHT LIMITED MAMMOGRAM  AND RIGHT BREAST ULTRASOUND:  Comparison:  08/23/2011, 08/22/2010, 08/20/2009, 08/19/2008  Findings:  ACR Breast Density Category 1: The breast tissue is almost entirely fatty.  Additional views confirm the presence of a small spiculated mass in the 12 o'clock position of the right breast, 13 cm from the right nipple.  This measures approximately 9 mm mammographically.  On physical exam, no mass is palpated in the  upper portion of the right breast.  Ultrasound is performed, showing an irregular mass at 12 o'clock 11 cm from the right nipple measuring 7 x 9 x 5 mm. Sonography of the right axilla demonstrates no abnormal lymph nodes.  The appearance is suspicious for invasive mammary carcinoma. Biopsy is recommended.  Ultrasound-guided core needle biopsy was discussed with the patient and she agreed with this plan.  IMPRESSION: Suspicious mass at 12 o'clock in the right breast.  RECOMMENDATION: The biopsy is recommended.  Ultrasound-guided core needle biopsy has been scheduled for 09/04/2012 at 1:00 p.m.  I have discussed the findings and recommendations with the patient. Results were also provided in writing at the conclusion of the visit.  If applicable, a reminder letter will be sent to the patient regarding the next appointment.  Report was telephoned to [facility] at Dr. Loistine Chance office.  BI-RADS CATEGORY 4:  Suspicious abnormality - biopsy should be considered.   Original Report Authenticated By: Ulyess Blossom, M.D.    [place]  R  08/29/2012  *RADIOLOGY REPORT*  Clinical Data:  The patient returns for evaluation of a possible mass in the 12 o'clock position of the right breast noted on recent screening study dated 08/26/2012.  DIGITAL DIAGNOSTIC RIGHT LIMITED MAMMOGRAM  AND RIGHT BREAST ULTRASOUND:  Comparison:  08/23/2011, 08/22/2010, 08/20/2009, 08/19/2008  Findings:  ACR Breast Density Category 1: The breast tissue is almost entirely fatty.  Additional views confirm the presence of a small spiculated mass in the 12 o'clock position of the right breast, 13 cm from the right nipple.  This measures approximately 9 mm mammographically.  On physical exam, no mass is palpated in the upper portion of the right breast.  Ultrasound is performed, showing an irregular mass at 12 o'clock 11 cm from the right nipple measuring 7 x 9 x 5 mm. Sonography of the right axilla demonstrates no abnormal lymph nodes.  The appearance is suspicious for invasive mammary carcinoma. Biopsy is recommended.  Ultrasound-guided core needle biopsy was discussed with the patient and she agreed with this plan.  IMPRESSION: Suspicious mass at 12 o'clock in the right breast.  RECOMMENDATION: The biopsy is recommended.  Ultrasound-guided core needle biopsy has been scheduled for 09/04/2012 at 1:00 p.m.  I have discussed the findings and recommendations with the patient. Results were also provided in writing at the conclusion of the visit.  If applicable, a reminder letter will be sent to the patient regarding the next appointment.  Report was telephoned to [facility] at Dr. Loistine Chance office.  BI-RADS CATEGORY 4:  Suspicious abnormality - biopsy should be considered.   Original Report Authenticated By: Ulyess Blossom, M.D.    [facility] Digital Diagnostic [facility] R  09/04/2012  *RADIOLOGY REPORT*  Clinical Data:  Status post ultrasound guided core biopsy right breast mass  DIGITAL DIAGNOSTIC RIGHT MAMMOGRAM  Comparison:  Previous exams.  Findings:  Films are performed following  ultrasound guided biopsy of hypoechoic mass right breast 12 o'clock.  CC and lateral view of the right breast demonstrates biopsy clip in the area of concern.  IMPRESSION: Post ultrasound guided core biopsy mammogram demonstrating biopsy clip in the area of concern.   Original Report Authenticated By: Abelardo Diesel, M.D.    [facility] Digital Screening  08/27/2012  *RADIOLOGY REPORT*  Clinical Data: Screening.  DIGITAL BILATERAL SCREENING MAMMOGRAM WITH CAD  Comparison: Previous exams.  FINDINGS:  ACR Breast Density Category 2: There is a scattered fibroglandular pattern.  In the right breast, possible distortion warrants further  evaluation with spot compression views and possibly ultrasound.  In the left breast, no suspicious masses or malignant type calcifications are identified.  Images were processed with CAD.  IMPRESSION: Further evaluation is suggested for distortion in the right breast.  RECOMMENDATION:  Diagnostic mammogram and possibly ultrasound of the right breast. (Code:[id number])  The patient will be contacted regarding the findings, and additional imaging will be scheduled.  BI-RADS CATEGORY 0:  Incomplete.  Need additional imaging evaluation and/or prior mammograms for comparison.   Original Report Authenticated By: Nolon Nations, M.D.    [facility]  09/05/2012  *RADIOLOGY REPORT*  ESTABLISHED PATIENT OFFICE VISIT - LEVEL II [phone])  Chief Complaint:  72 year old female returns for biopsy results following ultrasound guided right breast biopsy on 09/04/2012.  The patient has no complaints with the biopsy site.  History:  Suspicious mass in the upper right breast - biopsied on 09/04/2012.  Exam:  The biopsy site is clean and dry. There is no evidence of hematoma or infection.  Pathology: INVASIVE DUCTAL CARCINOMA.  Histology correlates with imaging findings. These findings were discussed with the patient and she understood the results.  Assessment and Plan:  Surgery/oncology  consultation.  The patient has been scheduled at the breast cancer Multidisciplinary [facility] for 09/11/2012.   Original Report Authenticated By: Margarette Canada, M.D.    [place] Rt Breast Bx W Loc Dev 1st Lesion Img Bx Spec US Guide  09/04/2012  *RADIOLOGY REPORT*  Clinical Data:  Suspicious mass right breast for biopsy  ULTRASOUND GUIDED CORE BIOPSY OF THE right BREAST  Comparison: Previous exams.  I met with the patient and we discussed the procedure of ultrasound- guided biopsy, including benefits and alternatives.  We discussed the high likelihood of a successful procedure. We discussed the risks of the procedure, including infection, bleeding, tissue injury, clip migration, and inadequate sampling.  Informed written consent was given.  Using sterile technique  lidocaine, ultrasound guidance and a 14 gauge automated biopsy device, biopsy was performed of hypoechoic lesion right breast 12 o'clock using a lateral approach.  At the conclusion of the procedure a  tissue marker clip was deployed into the biopsy cavity.  Follow up 2 view mammogram was performed and dictated separately.  IMPRESSION: Ultrasound guided biopsy of right breast.  No apparent complications.   Original Report Authenticated By: Abelardo Diesel, M.D.       IMPRESSION/PLAN: This is a lovely 72 yo woman with triple negative T1bN0M0 Right Breast Cancer.  She was discussed at our multidisciplinary breast cancer conference.  Recommendations are as following:  1) MRI of the breasts for further staging  2) Breast conservation therapy: lumpectomy and SLN bx (she has opted for this based on discussion with Dr. Dalbert Batman  3) chemotherapy  To follow, discussed with Dr Jana Hakim  4) Adjuvant radiotherapy to the right breast: We discussed the risks, benefits, and side effects of radiotherapy. We discussed that radiation would take approximately 6-7 weeks to complete - she may benefit from prone position if she can assume this position during RT. We spoke  about acute effects including skin irritation and fatigue as well as much less common late effects including lung irritation. We spoke about the latest technology that is used to minimize the risk of late effects for breast cancer patients undergoing radiotherapy. No guarantees of treatment were given. The patient is enthusiastic about proceeding with treatment. I look forward to participating in the patient's care.   I spent 25 minutes minutes face to  face with the patient and more than 50% of that time was spent in counseling and/or coordination of care.    __________________________________________   Eppie Gibson, MD

## 2012-09-12 ENCOUNTER — Telehealth (INDEPENDENT_AMBULATORY_CARE_PROVIDER_SITE_OTHER): Payer: Self-pay

## 2012-09-12 DIAGNOSIS — M25519 Pain in unspecified shoulder: Secondary | ICD-10-CM | POA: Diagnosis not present

## 2012-09-12 DIAGNOSIS — M75120 Complete rotator cuff tear or rupture of unspecified shoulder, not specified as traumatic: Secondary | ICD-10-CM | POA: Diagnosis not present

## 2012-09-12 NOTE — Telephone Encounter (Signed)
I called and made Diane [name] aware of the MRI order that was placed yesterday and she will schedule it and let me know the date.

## 2012-09-16 ENCOUNTER — Encounter: Payer: Self-pay | Admitting: *Deleted

## 2012-09-16 DIAGNOSIS — I10 Essential (primary) hypertension: Secondary | ICD-10-CM | POA: Diagnosis not present

## 2012-09-16 DIAGNOSIS — E7800 Pure hypercholesterolemia, unspecified: Secondary | ICD-10-CM | POA: Diagnosis not present

## 2012-09-16 DIAGNOSIS — I498 Other specified cardiac arrhythmias: Secondary | ICD-10-CM | POA: Diagnosis not present

## 2012-09-17 DIAGNOSIS — M75120 Complete rotator cuff tear or rupture of unspecified shoulder, not specified as traumatic: Secondary | ICD-10-CM | POA: Diagnosis not present

## 2012-09-17 DIAGNOSIS — M25519 Pain in unspecified shoulder: Secondary | ICD-10-CM | POA: Diagnosis not present

## 2012-09-19 ENCOUNTER — Telehealth (INDEPENDENT_AMBULATORY_CARE_PROVIDER_SITE_OTHER): Payer: Self-pay

## 2012-09-19 ENCOUNTER — Encounter (INDEPENDENT_AMBULATORY_CARE_PROVIDER_SITE_OTHER): Payer: Self-pay

## 2012-09-19 DIAGNOSIS — I10 Essential (primary) hypertension: Secondary | ICD-10-CM | POA: Diagnosis not present

## 2012-09-19 DIAGNOSIS — I4891 Unspecified atrial fibrillation: Secondary | ICD-10-CM | POA: Diagnosis not present

## 2012-09-19 DIAGNOSIS — M75120 Complete rotator cuff tear or rupture of unspecified shoulder, not specified as traumatic: Secondary | ICD-10-CM | POA: Diagnosis not present

## 2012-09-19 DIAGNOSIS — E119 Type 2 diabetes mellitus without complications: Secondary | ICD-10-CM | POA: Diagnosis not present

## 2012-09-19 DIAGNOSIS — M25519 Pain in unspecified shoulder: Secondary | ICD-10-CM | POA: Diagnosis not present

## 2012-09-19 NOTE — Telephone Encounter (Signed)
I left a message for the pt to call.  I need to remind her to stop her Pradaxa 5 days preop.

## 2012-09-20 ENCOUNTER — Ambulatory Visit
Admission: RE | Admit: 2012-09-20 | Discharge: 2012-09-20 | Disposition: A | Discharge status: Home / Self Care | Payer: Medicare Other | Source: Ambulatory Visit | Attending: General Surgery | Admitting: General Surgery

## 2012-09-20 DIAGNOSIS — C50911 Malignant neoplasm of unspecified site of right female breast: Secondary | ICD-10-CM

## 2012-09-20 DIAGNOSIS — C50919 Malignant neoplasm of unspecified site of unspecified female breast: Secondary | ICD-10-CM | POA: Diagnosis not present

## 2012-09-20 MED ORDER — GADOBENATE DIMEGLUMINE 529 MG/ML IV SOLN
10.0000 mL | Freq: Once | INTRAVENOUS | Status: AC | PRN
  Administered 2012-09-20: 10 mL via INTRAVENOUS

## 2012-09-23 ENCOUNTER — Other Ambulatory Visit (INDEPENDENT_AMBULATORY_CARE_PROVIDER_SITE_OTHER): Payer: Self-pay | Admitting: General Surgery

## 2012-09-23 ENCOUNTER — Other Ambulatory Visit: Payer: Self-pay | Admitting: *Deleted

## 2012-09-23 ENCOUNTER — Telehealth: Payer: Self-pay | Admitting: *Deleted

## 2012-09-23 DIAGNOSIS — C50911 Malignant neoplasm of unspecified site of right female breast: Secondary | ICD-10-CM

## 2012-09-23 DIAGNOSIS — C50919 Malignant neoplasm of unspecified site of unspecified female breast: Secondary | ICD-10-CM

## 2012-09-23 DIAGNOSIS — C50111 Malignant neoplasm of central portion of right female breast: Secondary | ICD-10-CM

## 2012-09-23 NOTE — Telephone Encounter (Signed)
Spoke to pt concerning [place] from 4/16.  Pt denies questions or concerns regarding dx or treatment care plan.  Confirmed future appts.  Encourage pt to call with needs.  Received verbal understanding.  Contact information given.

## 2012-09-24 ENCOUNTER — Ambulatory Visit
Admission: RE | Admit: 2012-09-24 | Discharge: 2012-09-24 | Disposition: A | Discharge status: Home / Self Care | Payer: Medicare Other | Source: Ambulatory Visit | Attending: General Surgery | Admitting: General Surgery

## 2012-09-24 DIAGNOSIS — M25519 Pain in unspecified shoulder: Secondary | ICD-10-CM | POA: Diagnosis not present

## 2012-09-24 DIAGNOSIS — C50911 Malignant neoplasm of unspecified site of right female breast: Secondary | ICD-10-CM

## 2012-09-25 ENCOUNTER — Encounter (INDEPENDENT_AMBULATORY_CARE_PROVIDER_SITE_OTHER): Payer: Self-pay

## 2012-09-25 ENCOUNTER — Telehealth (INDEPENDENT_AMBULATORY_CARE_PROVIDER_SITE_OTHER): Payer: Self-pay

## 2012-09-25 NOTE — Telephone Encounter (Signed)
Patient called back. I relayed message to her.

## 2012-09-25 NOTE — Telephone Encounter (Signed)
I left a message for the pt to call so I can give her the results below.  I also want to remind her to stop her Pradaxa 5 days preop.  I want to tell her that I am still working on a time for her postop appointment and will call her back when I have that.

## 2012-09-25 NOTE — Telephone Encounter (Signed)
Message copied by Gweneth Fritter on Wed Sep 25, 2012 12:15 PM ------      Message from: Adin Hector      Created: Tue Sep 24, 2012  5:14 PM       Call radiology reports to patient.Tell her that the breast MRI shows the small cancer to be a solitary finding. There are no other areas of concern.  Nothing further needs to be done. We are ready to go ahead with her breast cancer surgery as planned. ------

## 2012-09-26 ENCOUNTER — Telehealth (INDEPENDENT_AMBULATORY_CARE_PROVIDER_SITE_OTHER): Payer: Self-pay

## 2012-09-26 DIAGNOSIS — M75120 Complete rotator cuff tear or rupture of unspecified shoulder, not specified as traumatic: Secondary | ICD-10-CM | POA: Diagnosis not present

## 2012-09-26 DIAGNOSIS — M25519 Pain in unspecified shoulder: Secondary | ICD-10-CM | POA: Diagnosis not present

## 2012-09-26 NOTE — Telephone Encounter (Signed)
I called and gave the pt her po appointmen

## 2012-09-27 ENCOUNTER — Encounter: Payer: Self-pay | Admitting: *Deleted

## 2012-09-27 ENCOUNTER — Encounter (HOSPITAL_COMMUNITY): Payer: Self-pay | Admitting: Pharmacy Technician

## 2012-09-27 NOTE — Progress Notes (Signed)
Mailed after appt letter to pt. 

## 2012-09-30 ENCOUNTER — Other Ambulatory Visit: Payer: Medicare Other

## 2012-09-30 NOTE — Pre-Procedure Instructions (Signed)
[place]  09/30/2012   Your procedure is scheduled on:  Tues, May 13 @ 12:00 PM  Report to [place] at 10:00 AM.  Call this number if you have problems the morning of surgery: [phone]   Remember:   Do not eat food or drink liquids after midnight.   Take these medicines the morning of surgery with A SIP OF WATER: Prozac(Fluoxetine),Nebivolol(Bystolic),and Pain Pill(if needed)   Do not wear jewelry, make-up or nail polish.  Do not wear lotions, powders, or perfumes. You may wear deodorant.  Do not shave 48 hours prior to surgery.   Do not bring valuables to the hospital.  Contacts, dentures or bridgework may not be worn into surgery.  Leave suitcase in the car. After surgery it may be brought to your room.  For patients admitted to the hospital, checkout time is 11:00 AM the day of  discharge.   Patients discharged the day of surgery will not be allowed to drive  home.    Special Instructions: Shower using CHG 2 nights before surgery and the night before surgery.  If you shower the day of surgery use CHG.  Use special wash - you have one bottle of CHG for all showers.  You should use approximately 1/3 of the bottle for each shower.   Please read over the following fact sheets that you were given: Pain Booklet, Coughing and Deep Breathing, MRSA Information and Surgical Site Infection Prevention

## 2012-10-01 ENCOUNTER — Encounter (HOSPITAL_COMMUNITY)
Admission: RE | Admit: 2012-10-01 | Discharge: 2012-10-01 | Disposition: A | Discharge status: Home / Self Care | Payer: Medicare Other | Source: Ambulatory Visit | Attending: General Surgery | Admitting: General Surgery

## 2012-10-01 ENCOUNTER — Encounter: Payer: Self-pay | Admitting: Physician Assistant

## 2012-10-01 ENCOUNTER — Ambulatory Visit (HOSPITAL_BASED_OUTPATIENT_CLINIC_OR_DEPARTMENT_OTHER): Payer: Medicare Other | Admitting: Physician Assistant

## 2012-10-01 ENCOUNTER — Telehealth: Payer: Self-pay | Admitting: Oncology

## 2012-10-01 VITALS — BP 144/72 | HR 50 | Temp 98.2°F | Resp 20 | Ht 67.0 in | Wt 212.6 lb

## 2012-10-01 DIAGNOSIS — C50111 Malignant neoplasm of central portion of right female breast: Secondary | ICD-10-CM

## 2012-10-01 DIAGNOSIS — C50919 Malignant neoplasm of unspecified site of unspecified female breast: Secondary | ICD-10-CM

## 2012-10-01 DIAGNOSIS — C50119 Malignant neoplasm of central portion of unspecified female breast: Secondary | ICD-10-CM

## 2012-10-01 DIAGNOSIS — M75120 Complete rotator cuff tear or rupture of unspecified shoulder, not specified as traumatic: Secondary | ICD-10-CM | POA: Diagnosis not present

## 2012-10-01 DIAGNOSIS — M25519 Pain in unspecified shoulder: Secondary | ICD-10-CM | POA: Diagnosis not present

## 2012-10-01 DIAGNOSIS — C50911 Malignant neoplasm of unspecified site of right female breast: Secondary | ICD-10-CM

## 2012-10-01 NOTE — Progress Notes (Signed)
No show for PAT appt @ [facility] Short Stay; left message on pt phone.

## 2012-10-01 NOTE — Progress Notes (Signed)
Diane [name] was seen briefly in the office today. She was originally scheduled today to review her upcoming chemotherapy, which was originally scheduled to begin on May 19. She has not yet had her lumpectomy, however, so this needs to be pushed forward, now with her first chemotherapy scheduled for June 9.  Diane [name] is scheduled for her lumpectomy with Dr. Dalbert Batman on May 14. (Unfortunately, she was not aware of her preop appointment this morning, missed the appointment, and will need to call his office to reschedule this.) I will see Diane [name] on June 4 to review her treatment plan, prescribe her antinausea medications, and discuss how to take these medications appropriately. She'll then return on June 9 for day 1 cycle 1 of 4 planned q. three-week doses of docetaxel/cyclophosphamide.  I will mention that Diane [name] attended chemotherapy yesterday which she found helpful. She voices understanding of this plan, and will call with any changes prior to her next scheduled appointment.  Since this was actually a scheduling error on our part as she is still pending surgery, I did not charge her for this appointment today.  Micah Flesher, PA-C 10/01/2012

## 2012-10-02 ENCOUNTER — Encounter: Payer: Self-pay | Admitting: Oncology

## 2012-10-03 ENCOUNTER — Encounter (HOSPITAL_COMMUNITY)
Admission: RE | Admit: 2012-10-03 | Discharge: 2012-10-03 | Disposition: A | Discharge status: Home / Self Care | Payer: Medicare Other | Source: Ambulatory Visit | Attending: General Surgery | Admitting: General Surgery

## 2012-10-03 ENCOUNTER — Encounter (HOSPITAL_COMMUNITY): Payer: Self-pay

## 2012-10-03 DIAGNOSIS — Z171 Estrogen receptor negative status [ER-]: Secondary | ICD-10-CM | POA: Diagnosis not present

## 2012-10-03 DIAGNOSIS — E785 Hyperlipidemia, unspecified: Secondary | ICD-10-CM | POA: Diagnosis not present

## 2012-10-03 DIAGNOSIS — I2510 Atherosclerotic heart disease of native coronary artery without angina pectoris: Secondary | ICD-10-CM | POA: Diagnosis not present

## 2012-10-03 DIAGNOSIS — M25519 Pain in unspecified shoulder: Secondary | ICD-10-CM | POA: Diagnosis not present

## 2012-10-03 DIAGNOSIS — Z87891 Personal history of nicotine dependence: Secondary | ICD-10-CM | POA: Diagnosis not present

## 2012-10-03 DIAGNOSIS — M48061 Spinal stenosis, lumbar region without neurogenic claudication: Secondary | ICD-10-CM | POA: Diagnosis not present

## 2012-10-03 DIAGNOSIS — F329 Major depressive disorder, single episode, unspecified: Secondary | ICD-10-CM | POA: Diagnosis not present

## 2012-10-03 DIAGNOSIS — E669 Obesity, unspecified: Secondary | ICD-10-CM | POA: Diagnosis not present

## 2012-10-03 DIAGNOSIS — Z91013 Allergy to seafood: Secondary | ICD-10-CM | POA: Diagnosis not present

## 2012-10-03 DIAGNOSIS — D0590 Unspecified type of carcinoma in situ of unspecified breast: Secondary | ICD-10-CM | POA: Diagnosis not present

## 2012-10-03 DIAGNOSIS — I499 Cardiac arrhythmia, unspecified: Secondary | ICD-10-CM | POA: Diagnosis not present

## 2012-10-03 DIAGNOSIS — Z9079 Acquired absence of other genital organ(s): Secondary | ICD-10-CM | POA: Diagnosis not present

## 2012-10-03 DIAGNOSIS — Z7901 Long term (current) use of anticoagulants: Secondary | ICD-10-CM | POA: Diagnosis not present

## 2012-10-03 DIAGNOSIS — I10 Essential (primary) hypertension: Secondary | ICD-10-CM | POA: Diagnosis not present

## 2012-10-03 DIAGNOSIS — Z01818 Encounter for other preprocedural examination: Secondary | ICD-10-CM | POA: Diagnosis not present

## 2012-10-03 DIAGNOSIS — Z90710 Acquired absence of both cervix and uterus: Secondary | ICD-10-CM | POA: Diagnosis not present

## 2012-10-03 DIAGNOSIS — M129 Arthropathy, unspecified: Secondary | ICD-10-CM | POA: Diagnosis not present

## 2012-10-03 DIAGNOSIS — Z79899 Other long term (current) drug therapy: Secondary | ICD-10-CM | POA: Diagnosis not present

## 2012-10-03 DIAGNOSIS — C50119 Malignant neoplasm of central portion of unspecified female breast: Secondary | ICD-10-CM | POA: Diagnosis not present

## 2012-10-03 DIAGNOSIS — Z85828 Personal history of other malignant neoplasm of skin: Secondary | ICD-10-CM | POA: Diagnosis not present

## 2012-10-03 DIAGNOSIS — I4891 Unspecified atrial fibrillation: Secondary | ICD-10-CM | POA: Diagnosis not present

## 2012-10-03 DIAGNOSIS — F411 Generalized anxiety disorder: Secondary | ICD-10-CM | POA: Diagnosis not present

## 2012-10-03 DIAGNOSIS — E119 Type 2 diabetes mellitus without complications: Secondary | ICD-10-CM | POA: Diagnosis not present

## 2012-10-03 HISTORY — DX: Anxiety disorder, unspecified: F41.9

## 2012-10-03 HISTORY — DX: Cardiac arrhythmia, unspecified: I49.9

## 2012-10-03 LAB — COMPREHENSIVE METABOLIC PANEL
ALT: 9 U/L (ref 0–35)
AST: 22 U/L (ref 0–37)
Albumin: 3.6 g/dL (ref 3.5–5.2)
Alkaline Phosphatase: 65 U/L (ref 39–117)
BUN: 19 mg/dL (ref 6–23)
CO2: 27 mEq/L (ref 19–32)
Calcium: 9.2 mg/dL (ref 8.4–10.5)
Chloride: 101 mEq/L (ref 96–112)
Creatinine, Ser: 1.36 mg/dL — ABNORMAL HIGH (ref 0.50–1.10)
GFR calc Af Amer: 45 mL/min — ABNORMAL LOW (ref >90)
GFR calc non Af Amer: 39 mL/min — ABNORMAL LOW (ref >90)
Glucose, Bld: 77 mg/dL (ref 70–99)
Potassium: 3.2 mEq/L — ABNORMAL LOW (ref 3.5–5.1)
Sodium: 139 mEq/L (ref 135–145)
Total Bilirubin: 0.2 mg/dL — ABNORMAL LOW (ref 0.3–1.2)
Total Protein: 7.5 g/dL (ref 6.0–8.3)

## 2012-10-03 LAB — PROTIME-INR
INR: 1.17 (ref 0.00–1.49)
Prothrombin Time: 14.7 seconds (ref 11.6–15.2)

## 2012-10-03 LAB — CBC WITH DIFFERENTIAL/PLATELET
Basophils Absolute: 0 10*3/uL (ref 0.0–0.1)
Basophils Relative: 0 % (ref 0–1)
Eosinophils Absolute: 0.2 10*3/uL (ref 0.0–0.7)
Eosinophils Relative: 2 % (ref 0–5)
HCT: 36.4 % (ref 36.0–46.0)
Hemoglobin: 12 g/dL (ref 12.0–15.0)
Lymphocytes Relative: 53 % — ABNORMAL HIGH (ref 12–46)
Lymphs Abs: 4.5 10*3/uL — ABNORMAL HIGH (ref 0.7–4.0)
MCH: 26.9 pg (ref 26.0–34.0)
MCHC: 33 g/dL (ref 30.0–36.0)
MCV: 81.6 fL (ref 78.0–100.0)
Monocytes Absolute: 0.9 10*3/uL (ref 0.1–1.0)
Monocytes Relative: 10 % (ref 3–12)
Neutro Abs: 2.9 10*3/uL (ref 1.7–7.7)
Neutrophils Relative %: 34 % — ABNORMAL LOW (ref 43–77)
Platelets: 191 10*3/uL (ref 150–400)
RBC: 4.46 MIL/uL (ref 3.87–5.11)
RDW: 14.1 % (ref 11.5–15.5)
WBC: 8.5 10*3/uL (ref 4.0–10.5)

## 2012-10-03 LAB — URINALYSIS, ROUTINE W REFLEX MICROSCOPIC
Bilirubin Urine: NEGATIVE
Glucose, UA: NEGATIVE mg/dL
Hgb urine dipstick: NEGATIVE
Ketones, ur: NEGATIVE mg/dL
Nitrite: NEGATIVE
Protein, ur: NEGATIVE mg/dL
Specific Gravity, Urine: 1.01 (ref 1.005–1.030)
Urobilinogen, UA: 0.2 mg/dL (ref 0.0–1.0)
pH: 7 (ref 5.0–8.0)

## 2012-10-03 LAB — URINE MICROSCOPIC-ADD ON

## 2012-10-03 LAB — SURGICAL PCR SCREEN
MRSA, PCR: NEGATIVE
Staphylococcus aureus: NEGATIVE

## 2012-10-03 LAB — APTT: aPTT: 36 seconds (ref 24–37)

## 2012-10-03 NOTE — Progress Notes (Signed)
Primary Physician - Dr. Volanda Napoleon Cardiologist - Dr. Konrad Saha, last office visit on chart

## 2012-10-07 DIAGNOSIS — M25519 Pain in unspecified shoulder: Secondary | ICD-10-CM | POA: Diagnosis not present

## 2012-10-07 MED ORDER — CEFAZOLIN SODIUM-DEXTROSE 2-3 GM-% IV SOLR
2.0000 g | INTRAVENOUS | Status: AC
  Administered 2012-10-08: 2 g via INTRAVENOUS
  Filled 2012-10-07: qty 50

## 2012-10-07 NOTE — H&P (Signed)
Diane [name]   MRN:  [id number]   Description: 72 year old female  Provider: Adin Hector, MD  Department: [facility]      Diagnoses    Cancer of right breast    -  Primary    174.9    Cancer of central portion of female breast, right        174.1        History and Physical    Adin Hector, MD    Status: Addendum                        HPI Diane [name] is a 72 y.o. female.  She is referred by Dr. 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 She is being evaluated in the breast [place] today by Dr. Jana Hakim, Dr. Isidore Moos, and me. Dr. Amil Amen is her primary care physician and Dr. Terrence Dupont is her cardiologist.   She has no prior history of breast disease. 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 Alycia Rossetti. She went for her annual screening mammograms and mammograms and ultrasound showed a 9 mm mass in the 12:00 position of the right breast centrally. This is a 11 cm from the nipple. The left breast looks normal. Image guided biopsy showed invasive ductal carcinoma and DCIS, grade 3, triple negative breast cancer, T6-T7 70%. She has had no further imaging studies. We discussed her case and breast conference this morning. I am coordinating her treatment plan with Dr. Jana Hakim and Dr. Isidore Moos today.   Past history reveals squamous cell carcinoma of the vulva. Irregular heartbeat possible atrial fibrillation on Pradaxa.    Hypertension. Coronary artery disease. TAH/BSO/partial vulvectomy.    Family history is negative for breast or ovarian or prostate cancer.        Past Medical History   Diagnosis  Date   .  Lower back pain     .  Herniated disc     .  Arthritis     .  Lumbar stenosis         L4-5   .  Spondylolysis     .  Coronary artery disease     .  Hypertension      .  Hyperlipemia     .  Squamous cell carcinoma of vulva         Stage IB   .  Breast cancer     .  Diabetes mellitus without complication     .  Depression           Past Surgical History   Procedure  Laterality  Date   .  Wide local excision    01/2010       Bilateral groin excisions, re-excision of vulva   .  Posterior lumbar arthrodesis, pedicle screw fixation, posterolateral arthodesis    03/17/11   .  Tonsillectomy       .  Abdominal hysterectomy       .  Dilation and curettage of uterus       .  Tubal ligation       .  Colonoscopy       .  Shoulder arthroscopy with rotator cuff repair and subacromial decompression    06/04/2012       Procedure:  SHOULDER ARTHROSCOPY WITH ROTATOR CUFF REPAIR AND SUBACROMIAL DECOMPRESSION;  Surgeon: Nita Sells, MD;  Location: [facility];  Service: Orthopedics;  Laterality: Left;  LEFT SHOULDER ARTHROSCOPY WITH ROTATOR CUFF REPAIR AND SUBACROMIAL DECOMPRESSION AND DISTAL CLAVICLE RESECTION         Family History   Problem  Relation  Age of Onset   .  Stroke  Mother     .  Alzheimer's disease  Mother     .  Heart attack  Father     .  Heart attack  Brother          Social History History   Substance Use Topics   .  Smoking status:  Former Smoker -- 50 years       Types:  Cigarettes       Quit date:  05/29/2009   .  Smokeless tobacco:  Not on file   .  Alcohol Use:  No         Allergies   Allergen  Reactions   .  Shellfish Allergy  Swelling         Current Outpatient Prescriptions   Medication  Sig  Dispense  Refill   .  cephALEXin (KEFLEX) 500 MG capsule  Take 1 capsule (500 mg total) by mouth 3 (three) times daily.   30 capsule   0   .  dabigatran (PRADAXA) 75 MG CAPS  Take 75 mg by mouth every 12 (twelve) hours.         .  Ferrous Sulfate (IRON) 325 (65 FE) MG TABS  Take 1 tablet by mouth daily. OTC         .  FLUoxetine (PROZAC) 10 MG capsule  Take 10 mg by mouth daily.         .  nebivolol  (BYSTOLIC) 5 MG tablet  Take 5 mg by mouth daily.         [name] Kitchen  oxyCODONE-acetaminophen (ROXICET) 5-325 MG per tablet  Take 1-2 tablets by mouth every 4 (four) hours as needed for pain.   60 tablet   0   .  phenazopyridine (PYRIDIUM) 200 MG tablet  Take 1 tablet (200 mg total) by mouth 3 (three) times daily as needed for pain.   15 tablet   0   .  pravastatin (PRAVACHOL) 40 MG tablet  Take 40 mg by mouth every evening.         .  TRAZODONE HCL PO  Take 1 tablet by mouth daily.         .  Triamterene-HCTZ (DYAZIDE PO)  Take 1 tablet by mouth daily. 37.5/25 mg             No current facility-administered medications for this visit.        Review of Systems  Constitutional: Negative for fever, chills and unexpected weight change.  HENT: Negative for hearing loss, congestion, sore throat, trouble swallowing and voice change.   Eyes: Negative for visual disturbance.  Respiratory: Negative for cough and wheezing.   Cardiovascular: Negative for chest pain, palpitations and leg swelling.  Gastrointestinal: Negative for nausea, vomiting, abdominal pain, diarrhea, constipation, blood in stool, abdominal distention and anal bleeding.  Genitourinary: Negative for hematuria, vaginal bleeding and difficulty urinating.  Musculoskeletal: Negative for arthralgias.  Skin: Negative for rash and wound.  Neurological: Negative for seizures, syncope and headaches.  Hematological: Negative for adenopathy. Does not bruise/bleed easily.  Psychiatric/Behavioral: Negative for confusion.      There  were no vitals taken for this visit.   Physical Exam  Constitutional: She is oriented to person, place, and time. She appears well-developed and well-nourished. No distress.  HENT:   Head: Normocephalic and atraumatic.   Nose: Nose normal.   Mouth/Throat: No oropharyngeal exudate.  Eyes: Conjunctivae and EOM are normal. Pupils are equal, round, and reactive to light. Left eye exhibits no discharge. No scleral  icterus.  Neck: Neck supple. No JVD present. No tracheal deviation present. No thyromegaly present.  Cardiovascular: Normal rate, regular rhythm, normal heart sounds and intact distal pulses.    No murmur heard. Pulmonary/Chest: Effort normal and breath sounds normal. No respiratory distress. She has no wheezes. She has no rales. She exhibits no tenderness.  Breasts are very large and somewhat pendulous. Biopsy site right breast at 11:00 position. No hematoma. No mass in either breast. No skin change bilaterally. No axillary adenopathy bilaterally.  Abdominal: Soft. Bowel sounds are normal. She exhibits no distension and no mass. There is no tenderness. There is no rebound and no guarding.  Lower abdominal incision from hysterectomy.  Musculoskeletal: She exhibits no edema and no tenderness.  Lymphadenopathy:    She has no cervical adenopathy.  Neurological: She is alert and oriented to person, place, and time. She exhibits normal muscle tone. Coordination normal.  Skin: Skin is warm. No rash noted. She is not diaphoretic. No erythema. No pallor.  Psychiatric: She has a normal mood and affect. Her behavior is normal. Judgment and thought content normal.      Data Reviewed I have reviewed all of her imaging findings and her histology and breast diagnostic profile. I have coordinated her treatment plan with Dr. Jana Hakim and Dr. Isidore Moos. I have requested bilateral breast MRI. I have requested cardiac clearance with Dr. 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    Clinical stage T1b, N0,    9 mm tumor.    Patient desires breast conservation, if possible   Coronary artery disease and arrhythmia, opened axilla, followed by Dr. 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 NED at present.     Plan      Dr. Jana Hakim has recommended, and the patient agrees to adjuvant chemotherapy   The patient will be scheduled for bilateral breast MRI because she is a triple negative breast cancer Addendum(09/24/2012): Bilateral breast MRI shows this to be a solitary finding. No other areas of concern. Patient will be notified.   We will ask Dr. Terrence Dupont for cardiac clearance and permission to take her off her Pradaxa for 5 days prior to her breast surgery and Port-A-Cath placement   I had a long talk with the patient and her daughter about options for surgical intervention. We talked about lumpectomy, mastectomy, sentinel biopsy, Port-A-Cath insertion. We talked about cosmetic issues, reconstructive issues, complications. I told her that there did not appear to be an oncologic advantage to mastectomy, and she would very much like to keep her breast if possible. Considering the large size of her breasts, I think this would be better for her.She is aware that she will need radiation therapy postop.   After receiving cardiac clearance, and assuming the MRI shows no other disease, she will be scheduled for a right partial mastectomy with needle localization, right axillary sentinel node biopsy, and Port-A-Cath insertion with C-arm  and ultrasound.   I discussed the indications, details, techniques, and numerous risks of the surgery with the patient and her daughter. They understand all these issues and all their questions are answered. They are in full agreement with this plan.     Edsel Petrin. Dalbert Batman, M.D., [facility] General and Minimally invasive Surgery Breast and Colorectal Surgery Office:   [phone] Pager:   [phone]

## 2012-10-08 ENCOUNTER — Ambulatory Visit (HOSPITAL_COMMUNITY)
Admission: RE | Admit: 2012-10-08 | Discharge: 2012-10-08 | Disposition: A | Discharge status: Home / Self Care | Payer: Medicare Other | Source: Ambulatory Visit | Attending: General Surgery | Admitting: General Surgery

## 2012-10-08 ENCOUNTER — Ambulatory Visit
Admission: RE | Admit: 2012-10-08 | Discharge: 2012-10-08 | Disposition: A | Discharge status: Home / Self Care | Payer: Medicare Other | Source: Ambulatory Visit | Attending: General Surgery | Admitting: General Surgery

## 2012-10-08 ENCOUNTER — Encounter (HOSPITAL_COMMUNITY)
Admission: RE | Disposition: A | Discharge status: Home / Self Care | Payer: Self-pay | Source: Ambulatory Visit | Attending: General Surgery

## 2012-10-08 ENCOUNTER — Ambulatory Visit (HOSPITAL_COMMUNITY): Payer: Medicare Other | Admitting: Anesthesiology

## 2012-10-08 ENCOUNTER — Ambulatory Visit (HOSPITAL_COMMUNITY): Payer: Medicare Other

## 2012-10-08 ENCOUNTER — Encounter (HOSPITAL_COMMUNITY): Payer: Self-pay | Admitting: Anesthesiology

## 2012-10-08 ENCOUNTER — Encounter (HOSPITAL_COMMUNITY): Payer: Self-pay | Admitting: Critical Care Medicine

## 2012-10-08 ENCOUNTER — Encounter (HOSPITAL_COMMUNITY)
Admission: RE | Admit: 2012-10-08 | Discharge: 2012-10-08 | Disposition: A | Discharge status: Home / Self Care | Payer: Medicare Other | Source: Ambulatory Visit | Attending: General Surgery | Admitting: General Surgery

## 2012-10-08 DIAGNOSIS — Z79899 Other long term (current) drug therapy: Secondary | ICD-10-CM | POA: Insufficient documentation

## 2012-10-08 DIAGNOSIS — Z91013 Allergy to seafood: Secondary | ICD-10-CM | POA: Insufficient documentation

## 2012-10-08 DIAGNOSIS — I10 Essential (primary) hypertension: Secondary | ICD-10-CM | POA: Insufficient documentation

## 2012-10-08 DIAGNOSIS — Z7901 Long term (current) use of anticoagulants: Secondary | ICD-10-CM | POA: Insufficient documentation

## 2012-10-08 DIAGNOSIS — I4891 Unspecified atrial fibrillation: Secondary | ICD-10-CM | POA: Insufficient documentation

## 2012-10-08 DIAGNOSIS — C50911 Malignant neoplasm of unspecified site of right female breast: Secondary | ICD-10-CM

## 2012-10-08 DIAGNOSIS — I499 Cardiac arrhythmia, unspecified: Secondary | ICD-10-CM | POA: Insufficient documentation

## 2012-10-08 DIAGNOSIS — F3289 Other specified depressive episodes: Secondary | ICD-10-CM | POA: Insufficient documentation

## 2012-10-08 DIAGNOSIS — Z87891 Personal history of nicotine dependence: Secondary | ICD-10-CM | POA: Insufficient documentation

## 2012-10-08 DIAGNOSIS — Z85828 Personal history of other malignant neoplasm of skin: Secondary | ICD-10-CM | POA: Diagnosis not present

## 2012-10-08 DIAGNOSIS — Z9079 Acquired absence of other genital organ(s): Secondary | ICD-10-CM | POA: Insufficient documentation

## 2012-10-08 DIAGNOSIS — R0989 Other specified symptoms and signs involving the circulatory and respiratory systems: Secondary | ICD-10-CM | POA: Diagnosis not present

## 2012-10-08 DIAGNOSIS — M48061 Spinal stenosis, lumbar region without neurogenic claudication: Secondary | ICD-10-CM | POA: Insufficient documentation

## 2012-10-08 DIAGNOSIS — Z171 Estrogen receptor negative status [ER-]: Secondary | ICD-10-CM | POA: Diagnosis not present

## 2012-10-08 DIAGNOSIS — E119 Type 2 diabetes mellitus without complications: Secondary | ICD-10-CM | POA: Insufficient documentation

## 2012-10-08 DIAGNOSIS — Z950 Presence of cardiac pacemaker: Secondary | ICD-10-CM | POA: Diagnosis not present

## 2012-10-08 DIAGNOSIS — Z90710 Acquired absence of both cervix and uterus: Secondary | ICD-10-CM | POA: Insufficient documentation

## 2012-10-08 DIAGNOSIS — M549 Dorsalgia, unspecified: Secondary | ICD-10-CM | POA: Diagnosis not present

## 2012-10-08 DIAGNOSIS — D0590 Unspecified type of carcinoma in situ of unspecified breast: Secondary | ICD-10-CM | POA: Diagnosis not present

## 2012-10-08 DIAGNOSIS — E669 Obesity, unspecified: Secondary | ICD-10-CM | POA: Insufficient documentation

## 2012-10-08 DIAGNOSIS — C50919 Malignant neoplasm of unspecified site of unspecified female breast: Secondary | ICD-10-CM | POA: Insufficient documentation

## 2012-10-08 DIAGNOSIS — R0602 Shortness of breath: Secondary | ICD-10-CM | POA: Diagnosis not present

## 2012-10-08 DIAGNOSIS — F411 Generalized anxiety disorder: Secondary | ICD-10-CM | POA: Insufficient documentation

## 2012-10-08 DIAGNOSIS — C50119 Malignant neoplasm of central portion of unspecified female breast: Secondary | ICD-10-CM | POA: Diagnosis not present

## 2012-10-08 DIAGNOSIS — E785 Hyperlipidemia, unspecified: Secondary | ICD-10-CM | POA: Insufficient documentation

## 2012-10-08 DIAGNOSIS — F329 Major depressive disorder, single episode, unspecified: Secondary | ICD-10-CM | POA: Insufficient documentation

## 2012-10-08 DIAGNOSIS — C50111 Malignant neoplasm of central portion of right female breast: Secondary | ICD-10-CM

## 2012-10-08 DIAGNOSIS — I2510 Atherosclerotic heart disease of native coronary artery without angina pectoris: Secondary | ICD-10-CM | POA: Insufficient documentation

## 2012-10-08 DIAGNOSIS — M129 Arthropathy, unspecified: Secondary | ICD-10-CM | POA: Insufficient documentation

## 2012-10-08 HISTORY — PX: PORTACATH PLACEMENT: SHX2246

## 2012-10-08 HISTORY — PX: PARTIAL MASTECTOMY WITH NEEDLE LOCALIZATION AND AXILLARY SENTINEL LYMPH NODE BX: SHX6009

## 2012-10-08 HISTORY — PX: BREAST LUMPECTOMY: SHX2

## 2012-10-08 LAB — GLUCOSE, CAPILLARY: Glucose-Capillary: 110 mg/dL — ABNORMAL HIGH (ref 70–99)

## 2012-10-08 SURGERY — PARTIAL MASTECTOMY WITH NEEDLE LOCALIZATION AND AXILLARY SENTINEL LYMPH NODE BX
Anesthesia: General | Site: Chest | Laterality: Right | Wound class: Clean

## 2012-10-08 MED ORDER — ONDANSETRON HCL 4 MG/2ML IJ SOLN: 4.0000 mg | Freq: Once | INTRAMUSCULAR | Status: DC | PRN

## 2012-10-08 MED ORDER — HEPARIN SOD (PORK) LOCK FLUSH 100 UNIT/ML IV SOLN
INTRAVENOUS | Status: AC
  Filled 2012-10-08: qty 5

## 2012-10-08 MED ORDER — SODIUM CHLORIDE 0.9 % IV SOLN: INTRAVENOUS | Status: DC

## 2012-10-08 MED ORDER — MIDAZOLAM HCL 2 MG/2ML IJ SOLN
INTRAMUSCULAR | Status: AC
  Administered 2012-10-08: 2 mg
  Filled 2012-10-08: qty 2

## 2012-10-08 MED ORDER — SODIUM CHLORIDE 0.9 % IJ SOLN: 3.0000 mL | Freq: Two times a day (BID) | INTRAMUSCULAR | Status: DC

## 2012-10-08 MED ORDER — FENTANYL CITRATE 0.05 MG/ML IJ SOLN
100.0000 ug | Freq: Once | INTRAMUSCULAR | Status: AC
  Administered 2012-10-08: 100 ug via INTRAVENOUS

## 2012-10-08 MED ORDER — ACETAMINOPHEN 10 MG/ML IV SOLN: 1000.0000 mg | Freq: Once | INTRAVENOUS | Status: DC | PRN

## 2012-10-08 MED ORDER — ACETAMINOPHEN 650 MG RE SUPP: 650.0000 mg | RECTAL | Status: DC | PRN

## 2012-10-08 MED ORDER — ONDANSETRON HCL 4 MG/2ML IJ SOLN
INTRAMUSCULAR | Status: DC | PRN
  Administered 2012-10-08: 4 mg via INTRAVENOUS

## 2012-10-08 MED ORDER — BUPIVACAINE-EPINEPHRINE PF 0.5-1:200000 % IJ SOLN
INTRAMUSCULAR | Status: AC
  Filled 2012-10-08: qty 30

## 2012-10-08 MED ORDER — SODIUM CHLORIDE 0.9 % IJ SOLN
INTRAMUSCULAR | Status: DC | PRN
  Administered 2012-10-08: 3 mL

## 2012-10-08 MED ORDER — GLYCOPYRROLATE 0.2 MG/ML IJ SOLN
INTRAMUSCULAR | Status: DC | PRN
  Administered 2012-10-08 (×2): 0.1 mg via INTRAVENOUS

## 2012-10-08 MED ORDER — SODIUM CHLORIDE 0.9 % IR SOLN
Status: DC | PRN
  Administered 2012-10-08: 13:00:00

## 2012-10-08 MED ORDER — MORPHINE SULFATE 2 MG/ML IJ SOLN: 2.0000 mg | INTRAMUSCULAR | Status: DC | PRN

## 2012-10-08 MED ORDER — SODIUM CHLORIDE 0.9 % IV SOLN: 250.0000 mL | INTRAVENOUS | Status: DC | PRN

## 2012-10-08 MED ORDER — METHYLENE BLUE 1 % INJ SOLN
INTRAMUSCULAR | Status: AC
  Filled 2012-10-08: qty 10

## 2012-10-08 MED ORDER — LIDOCAINE HCL (CARDIAC) 20 MG/ML IV SOLN
INTRAVENOUS | Status: DC | PRN
  Administered 2012-10-08: 40 mg via INTRAVENOUS

## 2012-10-08 MED ORDER — BUPIVACAINE-EPINEPHRINE 0.5% -1:200000 IJ SOLN
INTRAMUSCULAR | Status: DC | PRN
  Administered 2012-10-08: 30 mL

## 2012-10-08 MED ORDER — OXYCODONE HCL 5 MG PO TABS
5.0000 mg | ORAL_TABLET | ORAL | Status: DC | PRN
  Administered 2012-10-08: 10 mg via ORAL

## 2012-10-08 MED ORDER — HYDROCODONE-ACETAMINOPHEN 5-325 MG PO TABS
1.0000 | ORAL_TABLET | ORAL | Status: DC | PRN
Start: 2012-10-08 — End: 2017-06-19

## 2012-10-08 MED ORDER — SODIUM CHLORIDE 0.9 % IJ SOLN: 3.0000 mL | INTRAMUSCULAR | Status: DC | PRN

## 2012-10-08 MED ORDER — MIDAZOLAM HCL 5 MG/ML IJ SOLN
2.0000 mg | Freq: Once | INTRAMUSCULAR | Status: AC
  Administered 2012-10-08: 1 mg via INTRAVENOUS

## 2012-10-08 MED ORDER — ACETAMINOPHEN 325 MG PO TABS: 650.0000 mg | ORAL_TABLET | ORAL | Status: DC | PRN

## 2012-10-08 MED ORDER — PHENYLEPHRINE HCL 10 MG/ML IJ SOLN
INTRAMUSCULAR | Status: DC | PRN
  Administered 2012-10-08: 80 ug via INTRAVENOUS
  Administered 2012-10-08: 120 ug via INTRAVENOUS
  Administered 2012-10-08: 80 ug via INTRAVENOUS

## 2012-10-08 MED ORDER — TECHNETIUM TC 99M SULFUR COLLOID FILTERED
1.0000 | Freq: Once | INTRAVENOUS | Status: AC | PRN
  Administered 2012-10-08: 1 via INTRADERMAL

## 2012-10-08 MED ORDER — LIDOCAINE-EPINEPHRINE (PF) 1 %-1:200000 IJ SOLN
INTRAMUSCULAR | Status: AC
  Filled 2012-10-08: qty 10

## 2012-10-08 MED ORDER — LACTATED RINGERS IV SOLN
INTRAVENOUS | Status: DC
  Administered 2012-10-08 (×2): via INTRAVENOUS

## 2012-10-08 MED ORDER — CHLORHEXIDINE GLUCONATE 4 % EX LIQD: 1.0000 "application " | Freq: Once | CUTANEOUS | Status: DC

## 2012-10-08 MED ORDER — OXYCODONE HCL 5 MG PO TABS
ORAL_TABLET | ORAL | Status: AC
  Filled 2012-10-08: qty 2

## 2012-10-08 MED ORDER — FENTANYL CITRATE 0.05 MG/ML IJ SOLN
INTRAMUSCULAR | Status: DC | PRN
  Administered 2012-10-08 (×4): 25 ug via INTRAVENOUS

## 2012-10-08 MED ORDER — FENTANYL CITRATE 0.05 MG/ML IJ SOLN
INTRAMUSCULAR | Status: AC
  Filled 2012-10-08: qty 2

## 2012-10-08 MED ORDER — HEPARIN SOD (PORK) LOCK FLUSH 100 UNIT/ML IV SOLN
INTRAVENOUS | Status: DC | PRN
  Administered 2012-10-08: 250 [IU]

## 2012-10-08 MED ORDER — ONDANSETRON HCL 4 MG/2ML IJ SOLN: 4.0000 mg | Freq: Four times a day (QID) | INTRAMUSCULAR | Status: DC | PRN

## 2012-10-08 MED ORDER — EPHEDRINE SULFATE 50 MG/ML IJ SOLN
INTRAMUSCULAR | Status: DC | PRN
  Administered 2012-10-08: 10 mg via INTRAVENOUS

## 2012-10-08 MED ORDER — LIDOCAINE-EPINEPHRINE (PF) 1 %-1:200000 IJ SOLN
INTRAMUSCULAR | Status: DC | PRN
  Administered 2012-10-08: 30 mL

## 2012-10-08 MED ORDER — HYDROMORPHONE HCL PF 1 MG/ML IJ SOLN: 0.2500 mg | INTRAMUSCULAR | Status: DC | PRN

## 2012-10-08 MED ORDER — PROPOFOL 10 MG/ML IV BOLUS
INTRAVENOUS | Status: DC | PRN
  Administered 2012-10-08: 180 mg via INTRAVENOUS
  Administered 2012-10-08: 30 mg via INTRAVENOUS
  Administered 2012-10-08: 20 mg via INTRAVENOUS

## 2012-10-08 MED ORDER — METHYLENE BLUE 1 % INJ SOLN
INTRAMUSCULAR | Status: DC | PRN
  Administered 2012-10-08: 2 mL via SUBMUCOSAL

## 2012-10-08 SURGICAL SUPPLY — 91 items
ADH SKN CLS APL DERMABOND .7 (GAUZE/BANDAGES/DRESSINGS) ×8
APL SKNCLS STERI-STRIP NONHPOA (GAUZE/BANDAGES/DRESSINGS) ×2
APPLIER CLIP 9.375 MED OPEN (MISCELLANEOUS) ×3
APR CLP MED 9.3 20 MLT OPN (MISCELLANEOUS) ×2
BAG DECANTER FOR FLEXI CONT (MISCELLANEOUS) ×3 IMPLANT
BENZOIN TINCTURE PRP APPL 2/3 (GAUZE/BANDAGES/DRESSINGS) ×3 IMPLANT
BINDER BREAST LRG (GAUZE/BANDAGES/DRESSINGS) IMPLANT
BINDER BREAST XLRG (GAUZE/BANDAGES/DRESSINGS) IMPLANT
BINDER BREAST XXLRG (GAUZE/BANDAGES/DRESSINGS) ×1 IMPLANT
BLADE SURG 10 STRL SS (BLADE) ×3 IMPLANT
BLADE SURG 15 STRL LF DISP TIS (BLADE) ×2 IMPLANT
BLADE SURG 15 STRL SS (BLADE) ×3
BLADE SURG ROTATE 9660 (MISCELLANEOUS) IMPLANT
CANISTER SUCTION 2500CC (MISCELLANEOUS) ×3 IMPLANT
CHLORAPREP W/TINT 10.5 ML (MISCELLANEOUS) ×3 IMPLANT
CHLORAPREP W/TINT 26ML (MISCELLANEOUS) ×3 IMPLANT
CLIP APPLIE 9.375 MED OPEN (MISCELLANEOUS) ×2 IMPLANT
CLOTH BEACON ORANGE TIMEOUT ST (SAFETY) ×4 IMPLANT
CONT SPEC 4OZ CLIKSEAL STRL BL (MISCELLANEOUS) ×3 IMPLANT
COVER PROBE W GEL 5X96 (DRAPES) ×3 IMPLANT
COVER SURGICAL LIGHT HANDLE (MISCELLANEOUS) ×3 IMPLANT
CRADLE DONUT ADULT HEAD (MISCELLANEOUS) ×3 IMPLANT
DECANTER SPIKE VIAL GLASS SM (MISCELLANEOUS) ×3 IMPLANT
DERMABOND ADVANCED (GAUZE/BANDAGES/DRESSINGS) ×4
DERMABOND ADVANCED .7 DNX12 (GAUZE/BANDAGES/DRESSINGS) ×2 IMPLANT
DEVICE DUBIN SPECIMEN MAMMOGRA (MISCELLANEOUS) ×3 IMPLANT
DRAIN CHANNEL 19F RND (DRAIN) IMPLANT
DRAPE C-ARM 42X72 X-RAY (DRAPES) ×3 IMPLANT
DRAPE CHEST BREAST 15X10 FENES (DRAPES) ×2 IMPLANT
DRAPE LAPAROSCOPIC ABDOMINAL (DRAPES) ×3 IMPLANT
DRAPE UTILITY 15X26 W/TAPE STR (DRAPE) ×6 IMPLANT
ELECT CAUTERY BLADE 6.4 (BLADE) ×3 IMPLANT
ELECT REM PT RETURN 9FT ADLT (ELECTROSURGICAL) ×3
ELECTRODE REM PT RTRN 9FT ADLT (ELECTROSURGICAL) ×2 IMPLANT
EVACUATOR SILICONE 100CC (DRAIN) IMPLANT
GAUZE SPONGE 4X4 16PLY XRAY LF (GAUZE/BANDAGES/DRESSINGS) ×3 IMPLANT
GLOVE BIOGEL PI IND STRL 6.5 (GLOVE) IMPLANT
GLOVE BIOGEL PI IND STRL 7.0 (GLOVE) IMPLANT
GLOVE BIOGEL PI INDICATOR 6.5 (GLOVE) ×2
GLOVE BIOGEL PI INDICATOR 7.0 (GLOVE) ×1
GLOVE ECLIPSE 6.5 STRL STRAW (GLOVE) ×1 IMPLANT
GLOVE EUDERMIC 7 POWDERFREE (GLOVE) ×3 IMPLANT
GLOVE SURG EUDERMIC 8 LTX PF (GLOVE) ×1 IMPLANT
GOWN PREVENTION PLUS LG XLONG (DISPOSABLE) ×1 IMPLANT
GOWN PREVENTION PLUS XLARGE (GOWN DISPOSABLE) ×3 IMPLANT
GOWN STRL NON-REIN LRG LVL3 (GOWN DISPOSABLE) ×5 IMPLANT
INTRODUCER 13FR (MISCELLANEOUS) IMPLANT
INTRODUCER COOK 11FR (CATHETERS) IMPLANT
KIT BASIN OR (CUSTOM PROCEDURE TRAY) ×3 IMPLANT
KIT MARKER MARGIN INK (KITS) ×3 IMPLANT
KIT PORT POWER 8FR ISP CVUE (Catheter) IMPLANT
KIT PORT POWER 9.6FR MRI PREA (Catheter) IMPLANT
KIT PORT POWER ISP 8FR (Catheter) IMPLANT
KIT POWER CATH 8FR (Catheter) ×1 IMPLANT
KIT ROOM TURNOVER OR (KITS) ×3 IMPLANT
MARKER SKIN DUAL TIP RULER LAB (MISCELLANEOUS) ×1 IMPLANT
NDL 18GX1X1/2 (RX/OR ONLY) (NEEDLE) ×2 IMPLANT
NDL HYPO 25GX1X1/2 BEV (NEEDLE) ×4 IMPLANT
NEEDLE 18GX1X1/2 (RX/OR ONLY) (NEEDLE) ×3 IMPLANT
NEEDLE HYPO 25GX1X1/2 BEV (NEEDLE) ×12 IMPLANT
NS IRRIG 1000ML POUR BTL (IV SOLUTION) ×3 IMPLANT
PACK GENERAL/GYN (CUSTOM PROCEDURE TRAY) ×3 IMPLANT
PACK SURGICAL SETUP 50X90 (CUSTOM PROCEDURE TRAY) ×3 IMPLANT
PAD ARMBOARD 7.5X6 YLW CONV (MISCELLANEOUS) ×3 IMPLANT
PENCIL BUTTON HOLSTER BLD 10FT (ELECTRODE) ×3 IMPLANT
SET INTRODUCER 12FR PACEMAKER (SHEATH) IMPLANT
SET SHEATH INTRODUCER 10FR (MISCELLANEOUS) IMPLANT
SHEATH COOK PEEL AWAY SET 9F (SHEATH) IMPLANT
SPONGE GAUZE 4X4 12PLY (GAUZE/BANDAGES/DRESSINGS) ×5 IMPLANT
SPONGE LAP 4X18 X RAY DECT (DISPOSABLE) ×3 IMPLANT
STAPLER VISISTAT 35W (STAPLE) IMPLANT
STERION SUTURE AID BOOTIES ×1 IMPLANT
STRIP CLOSURE SKIN 1/2X4 (GAUZE/BANDAGES/DRESSINGS) ×3 IMPLANT
SURGILUBE 3G PEEL PACK STRL (MISCELLANEOUS) IMPLANT
SUT ETHILON 3 0 FSL (SUTURE) IMPLANT
SUT MNCRL AB 4-0 PS2 18 (SUTURE) ×5 IMPLANT
SUT PROLENE 2 0 CT2 30 (SUTURE) ×4 IMPLANT
SUT SILK 2 0 SH (SUTURE) ×3 IMPLANT
SUT VIC AB 3-0 SH 18 (SUTURE) ×4 IMPLANT
SUT VIC AB 3-0 SH 27 (SUTURE) ×3
SUT VIC AB 3-0 SH 27XBRD (SUTURE) ×2 IMPLANT
SUT VICRYL AB 3 0 TIES (SUTURE) IMPLANT
SYR 20ML ECCENTRIC (SYRINGE) ×6 IMPLANT
SYR 5ML LUER SLIP (SYRINGE) ×3 IMPLANT
SYR BULB 3OZ (MISCELLANEOUS) ×3 IMPLANT
SYR CONTROL 10ML LL (SYRINGE) ×7 IMPLANT
SYRINGE 10CC LL (SYRINGE) ×3 IMPLANT
TOWEL OR 17X24 6PK STRL BLUE (TOWEL DISPOSABLE) ×3 IMPLANT
TOWEL OR 17X26 10 PK STRL BLUE (TOWEL DISPOSABLE) ×3 IMPLANT
TUBE CONNECTING 12X1/4 (SUCTIONS) IMPLANT
YANKAUER SUCT BULB TIP NO VENT (SUCTIONS) IMPLANT

## 2012-10-08 NOTE — Interval H&P Note (Signed)
History and Physical Interval Note:  10/08/2012 11:54 AM  Diane [name]  has presented today for surgery, with the diagnosis of cancer right breast  The goals and the  various methods of treatment have been discussed with the patient and family. After consideration of risks, benefits and other options for treatment, the patient has consented to  Procedure(s): RIGHT PARTIAL MASTECTOMY WITH NEEDLE LOCALIZATION AND RIGHT AXILLARY SENTINEL LYMPH NODE BX (Right) INSERTION PORT-A-CATH WITH ULTRASOUND GUIDANCE (N/A) as a surgical intervention .  The patient's history has been reviewed, patient examined today , no change in status, stable for surgery.  I have reviewed the patient's chart and labs.  Questions were answered to the patient's satisfaction.     Adin Hector

## 2012-10-08 NOTE — Op Note (Signed)
Patient Name:           Diane [name]   Date of Surgery:        10/08/2012  Pre op Diagnosis:      High-grade invasive ductal carcinoma right breast, central, triple negative, clinical stage T1b., N0  Post op Diagnosis:    Same  Procedure:                 Insertion of 8 French ClearView power port tunneled venous vascular access device with fluoroscopic guidance and positioning Inject blue dye right breast Right partial mastectomy with needle localization Right axillary sentinel node biopsy.  Surgeon:                     Edsel Petrin. Dalbert Batman, M.D., [place]  Assistant:                      None  Operative Indications:   Diane D Stach is a 72 y.o. female. She was referred by Dr. 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 She is being evaluated in the breast [place] today by Dr. Jana Hakim, Dr. Isidore Moos, and me. Dr. Amil Amen is her primary care physician and Dr. Terrence Dupont is her cardiologist.  She has no prior history of breast disease. 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 Alycia Rossetti. She went for her annual screening mammograms and mammograms and ultrasound showed a 9 mm mass in the 12:00 position of the right breast centrally. This is  11 cm from the nipple. The left breast looks normal. Image guided biopsy showed invasive ductal carcinoma and DCIS, grade 3, triple negative breast cancer, ki-67 70%.  Bilateral breast MRIs shows this to be a solitary findings. There are no other areas of concern. We discussed her case and breast conference. I coordinated her treatment plan with Dr. Jana Hakim and Dr. Isidore Moos.  Past history reveals squamous cell carcinoma of the vulva. Irregular heartbeat possible atrial fibrillation on Pradaxa. Hypertension. Coronary artery disease. TAH/BSO/partial vulvectomy.  Family history is negative for breast  or ovarian or prostate cancer.   Operative Findings:       The Port-A-Cath was inserted through the left subclavian vein and the tip appears to be well positioned in the superior vena cava above the right atrium. The localizing wire entered the right breast from the superior location and was directed inferiorly and posteriorly. Specimen mammogram looked good with a marker clip and wire in the center of the specimen. I found a single sentinel lymph node which was very hot and blue.  Procedure in Detail:          Following placement of the localizing wire the patient underwent injection of radionuclide by the nuclear medicine technician in the holding area, and the patient was taken to the operating room where general anesthesia with LMA device was induced. Intravenous antibiotics were given. Surgical time out was performed.  Following alcohol prep I injected 5 cc of blue dye into the right breast subareolar area.   This was 2 cc of methylene blue mixed with 3 cc of saline. The breast was massaged.  The patient was then positioned with a roll behind her shoulders and her arms at her sides The neck and chest were prepped  extensively after excluding the localizing wire with a Tegaderm. 0.5%  Marcaine with epinephrine was used as local infiltration anesthetic. A left subclavian venipuncture was performed and a guidewire inserted into the superior vena cava under fluoroscopic guidance. Using the C-arm I marked a template on the chest wall were about the tip of the catheter should be. I made a small incision at the wire insertion site. I then made a transverse incision in the left parasternal area about 2-1/2 cm below the left clavicular head. I debrided some of the subcutaneous tissues  because  the subcutaneous tissue was rather bulky. Using a tunneling device I drew the catheter from the port pocket site to the wire insertion site. I then measured the catheter and cut it 31 cm in length. The catheter was  secured to the port with a locking device and it was flushed with heparinized saline. The port was sutured to the pectoralis fascia with 3 interrupted sutures of 2-0 Prolene. I inserted the dilator and peel-away sheath over the guidewire into the central venous circulation, and I removed the wire and dilator. The catheter was threaded into the peel-away sheath and  the peel-away sheath removed. Ultimately, fluoroscopy confirmed that the tip of the catheter was within the superior vena cava and there is no deformity of the catheter anywhere along its course. The catheter flushed easily and had excellent blood return. The port and catheter were then flushed with concentrated heparin. The subcutaneous tissues were closed with 3-0 Vicryl sutures and the skin incisions closed with subcuticular sutures of 4-0 Monocryl and Dermabond.  We then reprepped and redraped the patient with her arms extended at her sides. Another time out was performed. We prepped the right arm, the entire right chest and neck. 0.5% Marcaine with epinephrine was used as local infiltration anesthetic. I made a curvilinear incision in the upper outer quadrant of the right breast at about the 11:00 position. Dissection was carried down into the breast tissue around the localizing wire. I removed the breast tissue and marked it with silk sutures and with the 6 color ink kit. [name] Kitchen Specimen mammogram looked good with a marker clip and the wire in the center of the specimen. The specimen was marked and sent to the lab. The lumpectomy cavity was marked with metal clips, irrigated and closed with 3-0 Vicryl sutures and 4-0 Monocryl in the skin and Dermabond on the skin.  I made a transverse incision the right axilla at the hairline. Dissection was carried down through subcutaneous tissue. Using the neoprobe I found one very hot blue sentinel node and sent that to the lab. After I removed that lymph node there was no other lymph nodes with radioactivity  and no other blue dye. I elected to do  no further lymph node dissection. The wound was irrigated with saline, closed with 3-0 Vicryl sutures and 4-0 Monocryl the skin. Dermabond on the skin.  Breast binder was placed. The patient taken to recovery stable condition. EBL 30 cc or less. Counts correct. Complications none.     Edsel Petrin. Dalbert Batman, M.D., FACS General and Minimally Invasive Surgery Breast and Colorectal Surgery  10/08/2012 2:27 PM

## 2012-10-08 NOTE — Anesthesia Preprocedure Evaluation (Addendum)
Anesthesia Evaluation  Patient identified by MRN, date of birth, ID band Patient awake    Reviewed: Allergy & Precautions, H&P , NPO status , Patient's Chart, lab work & pertinent test results, reviewed documented beta blocker date and time   Airway Mallampati: II TM Distance: >3 FB Neck ROM: Full    Dental  (+) Teeth Intact, Dental Advisory Given and Missing,    Pulmonary shortness of breath,  breath sounds clear to auscultation        Cardiovascular hypertension, Pt. on home beta blockers + CAD + dysrhythmias Atrial Fibrillation Rhythm:Irregular Rate:Normal     Neuro/Psych  Headaches, PSYCHIATRIC DISORDERS Anxiety Depression    GI/Hepatic   Endo/Other  diabetes, Well Controlled  Renal/GU      Musculoskeletal   Abdominal (+) + obese,   Peds  Hematology   Anesthesia Other Findings   Reproductive/Obstetrics                          Anesthesia Physical Anesthesia Plan  ASA: III  Anesthesia Plan: General   Post-op Pain Management:    Induction: Intravenous  Airway Management Planned: LMA  Additional Equipment:   Intra-op Plan:   Post-operative Plan: Extubation in OR  Informed Consent: I have reviewed the patients History and Physical, chart, labs and discussed the procedure including the risks, benefits and alternatives for the proposed anesthesia with the patient or authorized representative who has indicated his/her understanding and acceptance.   Dental advisory given  Plan Discussed with: CRNA, Anesthesiologist and Surgeon  Anesthesia Plan Comments: (H/O Afib sinus bradycardia on last ECG 09/16/12  Htn Type 2 DM glucose 77 Mild renal insufficiency Cr 1.36  Plan GA with LMA  Roberts Gaudy, MD)      Anesthesia Quick Evaluation

## 2012-10-08 NOTE — Preoperative (Signed)
Beta Blockers   Reason not to administer Beta Blockers:Not Applicable, pt took bystolic [id number]

## 2012-10-08 NOTE — Transfer of Care (Signed)
Immediate Anesthesia Transfer of Care Note  Patient: Diane [name]  Procedure(s) Performed: Procedure(s) with comments: RIGHT PARTIAL MASTECTOMY WITH NEEDLE LOCALIZATION AND RIGHT AXILLARY SENTINEL LYMPH NODE BX (Right) - Injection of 1% Methylene Blue dye into right breast INSERTION PORT-A-CATH WITH ULTRASOUND GUIDANCE (Left) - Using 59F Powerport Clearvue  Patient Location: PACU  Anesthesia Type:General  Level of Consciousness: awake, alert  and oriented  Airway & Oxygen Therapy: Patient Spontanous Breathing and Patient connected to nasal cannula oxygen  Post-op Assessment: Report given to PACU RN, Post -op Vital signs reviewed and stable and Patient moving all extremities X 4  Post vital signs: Reviewed and stable  Complications: No apparent anesthesia complications

## 2012-10-08 NOTE — Anesthesia Postprocedure Evaluation (Signed)
  Anesthesia Post-op Note  Patient: Diane [name]  Procedure(s) Performed: Procedure(s) with comments: RIGHT PARTIAL MASTECTOMY WITH NEEDLE LOCALIZATION AND RIGHT AXILLARY SENTINEL LYMPH NODE BX (Right) - Injection of 1% Methylene Blue dye into right breast INSERTION PORT-A-CATH WITH ULTRASOUND GUIDANCE (Left) - Using 56F Powerport Clearvue  Patient Location: PACU  Anesthesia Type:General  Level of Consciousness: awake, alert  and oriented  Airway and Oxygen Therapy: Patient Spontanous Breathing and Patient connected to nasal cannula oxygen  Post-op Pain: mild  Post-op Assessment: Post-op Vital signs reviewed, Patient's Cardiovascular Status Stable, Respiratory Function Stable and Patent Airway  Post-op Vital Signs: stable  Complications: No apparent anesthesia complications

## 2012-10-09 ENCOUNTER — Other Ambulatory Visit: Payer: Medicare Other

## 2012-10-10 ENCOUNTER — Encounter (HOSPITAL_COMMUNITY): Payer: Self-pay | Admitting: General Surgery

## 2012-10-13 NOTE — Progress Notes (Signed)
Quick Note:  Inform patient of Pathology report,. Tell her the tumor is tiny and margins are clear and nodes are negative. Good news. ______

## 2012-10-14 ENCOUNTER — Other Ambulatory Visit: Payer: Medicare Other | Admitting: Lab

## 2012-10-14 ENCOUNTER — Telehealth (INDEPENDENT_AMBULATORY_CARE_PROVIDER_SITE_OTHER): Payer: Self-pay

## 2012-10-14 ENCOUNTER — Ambulatory Visit: Payer: Medicare Other | Admitting: Physician Assistant

## 2012-10-14 ENCOUNTER — Ambulatory Visit: Payer: Medicare Other

## 2012-10-14 NOTE — Telephone Encounter (Signed)
Message copied by Gweneth Fritter on Mon Oct 14, 2012  9:56 AM ------      Message from: Adin Hector      Created: Sun Oct 13, 2012  6:53 PM       Inform patient of Pathology report,. Tell her the tumor is tiny and margins are clear and nodes are negative. Good news. ------

## 2012-10-14 NOTE — Telephone Encounter (Signed)
I called and spoke to the pt.  I gave her the results.  She is coming in Wednesday.

## 2012-10-15 ENCOUNTER — Ambulatory Visit: Payer: Medicare Other

## 2012-10-16 ENCOUNTER — Ambulatory Visit (INDEPENDENT_AMBULATORY_CARE_PROVIDER_SITE_OTHER): Payer: Medicare Other | Admitting: General Surgery

## 2012-10-16 ENCOUNTER — Encounter (INDEPENDENT_AMBULATORY_CARE_PROVIDER_SITE_OTHER): Payer: Self-pay | Admitting: General Surgery

## 2012-10-16 VITALS — BP 110/62 | HR 62 | Temp 96.6°F | Resp 18 | Ht 67.5 in | Wt 212.4 lb

## 2012-10-16 DIAGNOSIS — C50119 Malignant neoplasm of central portion of unspecified female breast: Secondary | ICD-10-CM

## 2012-10-16 DIAGNOSIS — C50111 Malignant neoplasm of central portion of right female breast: Secondary | ICD-10-CM

## 2012-10-16 NOTE — Progress Notes (Signed)
Patient ID: Diane [name], female   DOB: [birth year], 72 y.o.   MRN: [id number] History: This patient underwent right partial mastectomy, sentinel lymph node biopsy, and Port-A-Cath insertion on 10/09/2011. Final pathology shows invasive ductal carcinoma, triple negative, negative margins, pathologic stage T1a, N0. She has no complaints about any of her incisions except a  little bit of soreness. She states that she will start chemotherapy on June 9. She is anticipating radiation therapy after chemotherapy  Exam: Patient looks good. In good spirits. Right breast incision, right axillary incision, and left infraclavicular Port-A-Cath incision looked fine. No hematoma. No skin necrosis. The fluid. No infection  Assessment invasive triple negative breast cancer right breast, central,T1a,N0. Recovering uneventfully following right partial mastectomy, sentinel node biopsy and Port-A-Cath insertion  Plan: The patient to begin chemotherapy in June, to follow by radiation therapy Return to see me in 4 months.   Edsel Petrin. Dalbert Batman, M.D., [facility] General and Minimally invasive Surgery Breast and Colorectal Surgery Office:   [phone] Pager:   [phone]

## 2012-10-16 NOTE — Patient Instructions (Signed)
All of the incisions in your right breast, right axilla, and Port-A-Cath site appeared to be healing normally. You may pick the Dermabond covering off. You may take a shower.  I think that it will be safe to start the chemotherapy on June 9.  Return to see Dr. Dalbert Batman in 4 months, sooner if there are any problems.

## 2012-10-22 DIAGNOSIS — E1149 Type 2 diabetes mellitus with other diabetic neurological complication: Secondary | ICD-10-CM | POA: Diagnosis not present

## 2012-10-22 DIAGNOSIS — L608 Other nail disorders: Secondary | ICD-10-CM | POA: Diagnosis not present

## 2012-10-30 ENCOUNTER — Encounter: Payer: Self-pay | Admitting: Physician Assistant

## 2012-10-30 ENCOUNTER — Ambulatory Visit (HOSPITAL_BASED_OUTPATIENT_CLINIC_OR_DEPARTMENT_OTHER): Payer: Medicare Other | Admitting: Physician Assistant

## 2012-10-30 ENCOUNTER — Telehealth: Payer: Self-pay | Admitting: *Deleted

## 2012-10-30 ENCOUNTER — Telehealth: Payer: Self-pay | Admitting: Oncology

## 2012-10-30 ENCOUNTER — Other Ambulatory Visit (HOSPITAL_BASED_OUTPATIENT_CLINIC_OR_DEPARTMENT_OTHER): Payer: Medicare Other | Admitting: Lab

## 2012-10-30 VITALS — BP 151/80 | HR 84 | Temp 97.4°F | Resp 20 | Ht 67.5 in | Wt 214.1 lb

## 2012-10-30 DIAGNOSIS — R319 Hematuria, unspecified: Secondary | ICD-10-CM | POA: Diagnosis not present

## 2012-10-30 DIAGNOSIS — Z171 Estrogen receptor negative status [ER-]: Secondary | ICD-10-CM | POA: Diagnosis not present

## 2012-10-30 DIAGNOSIS — R350 Frequency of micturition: Secondary | ICD-10-CM | POA: Diagnosis not present

## 2012-10-30 DIAGNOSIS — C50419 Malignant neoplasm of upper-outer quadrant of unspecified female breast: Secondary | ICD-10-CM

## 2012-10-30 DIAGNOSIS — C50911 Malignant neoplasm of unspecified site of right female breast: Secondary | ICD-10-CM

## 2012-10-30 DIAGNOSIS — Z5111 Encounter for antineoplastic chemotherapy: Secondary | ICD-10-CM | POA: Diagnosis not present

## 2012-10-30 DIAGNOSIS — C50111 Malignant neoplasm of central portion of right female breast: Secondary | ICD-10-CM

## 2012-10-30 DIAGNOSIS — C50119 Malignant neoplasm of central portion of unspecified female breast: Secondary | ICD-10-CM | POA: Diagnosis not present

## 2012-10-30 DIAGNOSIS — R7989 Other specified abnormal findings of blood chemistry: Secondary | ICD-10-CM

## 2012-10-30 DIAGNOSIS — N390 Urinary tract infection, site not specified: Secondary | ICD-10-CM | POA: Diagnosis not present

## 2012-10-30 DIAGNOSIS — E785 Hyperlipidemia, unspecified: Secondary | ICD-10-CM | POA: Diagnosis not present

## 2012-10-30 DIAGNOSIS — C50919 Malignant neoplasm of unspecified site of unspecified female breast: Secondary | ICD-10-CM | POA: Diagnosis not present

## 2012-10-30 DIAGNOSIS — I10 Essential (primary) hypertension: Secondary | ICD-10-CM | POA: Diagnosis not present

## 2012-10-30 DIAGNOSIS — Z8544 Personal history of malignant neoplasm of other female genital organs: Secondary | ICD-10-CM | POA: Diagnosis not present

## 2012-10-30 DIAGNOSIS — R197 Diarrhea, unspecified: Secondary | ICD-10-CM | POA: Diagnosis not present

## 2012-10-30 LAB — CBC WITH DIFFERENTIAL/PLATELET
BASO%: 0.5 % (ref 0.0–2.0)
Basophils Absolute: 0 10*3/uL (ref 0.0–0.1)
EOS%: 3.5 % (ref 0.0–7.0)
Eosinophils Absolute: 0.2 10*3/uL (ref 0.0–0.5)
HCT: 36.2 % (ref 34.8–46.6)
HGB: 11.7 g/dL (ref 11.6–15.9)
LYMPH%: 42 % (ref 14.0–49.7)
MCH: 27.1 pg (ref 25.1–34.0)
MCHC: 32.4 g/dL (ref 31.5–36.0)
MCV: 83.6 fL (ref 79.5–101.0)
MONO#: 0.7 10*3/uL (ref 0.1–0.9)
MONO%: 10.8 % (ref 0.0–14.0)
NEUT#: 2.7 10*3/uL (ref 1.5–6.5)
NEUT%: 43.2 % (ref 38.4–76.8)
Platelets: 159 10*3/uL (ref 145–400)
RBC: 4.33 10*6/uL (ref 3.70–5.45)
RDW: 15 % — ABNORMAL HIGH (ref 11.2–14.5)
WBC: 6.2 10*3/uL (ref 3.9–10.3)
lymph#: 2.6 10*3/uL (ref 0.9–3.3)

## 2012-10-30 LAB — COMPREHENSIVE METABOLIC PANEL (CC13)
ALT: 16 U/L (ref 0–55)
AST: 15 U/L (ref 5–34)
Albumin: 3.2 g/dL — ABNORMAL LOW (ref 3.5–5.0)
Alkaline Phosphatase: 81 U/L (ref 40–150)
BUN: 28.4 mg/dL — ABNORMAL HIGH (ref 7.0–26.0)
CO2: 26 mEq/L (ref 22–29)
Calcium: 8.4 mg/dL (ref 8.4–10.4)
Chloride: 104 mEq/L (ref 98–107)
Creatinine: 1.5 mg/dL — ABNORMAL HIGH (ref 0.6–1.1)
Glucose: 91 mg/dl (ref 70–99)
Potassium: 3.8 mEq/L (ref 3.5–5.1)
Sodium: 139 mEq/L (ref 136–145)
Total Bilirubin: 0.27 mg/dL (ref 0.20–1.20)
Total Protein: 7 g/dL (ref 6.4–8.3)

## 2012-10-30 MED ORDER — LIDOCAINE-PRILOCAINE 2.5-2.5 % EX CREA: TOPICAL_CREAM | CUTANEOUS | Status: DC

## 2012-10-30 MED ORDER — DEXAMETHASONE 4 MG PO TABS: ORAL_TABLET | ORAL | Status: DC

## 2012-10-30 MED ORDER — PROCHLORPERAZINE MALEATE 10 MG PO TABS: ORAL_TABLET | ORAL | Status: DC

## 2012-10-30 MED ORDER — TOBRAMYCIN-DEXAMETHASONE 0.3-0.1 % OP SUSP: 1.0000 [drp] | Freq: Two times a day (BID) | OPHTHALMIC | Status: DC

## 2012-10-30 MED ORDER — LORAZEPAM 0.5 MG PO TABS: 0.5000 mg | ORAL_TABLET | Freq: Every evening | ORAL | Status: DC | PRN

## 2012-10-30 MED ORDER — ONDANSETRON HCL 8 MG PO TABS: ORAL_TABLET | ORAL | Status: DC

## 2012-10-30 NOTE — Telephone Encounter (Signed)
Per staff message and POF I have scheduled appts.  JMW  

## 2012-10-30 NOTE — Progress Notes (Signed)
ID: Diane [name]   DOB: Feb 14, 1941  MR#: [id number]  CSN#:[id number]  PCP: Myrtis Ser, MD GYN:  SU: Diane [name] OTHER MD: [name], Nancy Marus, [name] Mardee Postin   HISTORY OF PRESENT ILLNESS: Diane had routine screening mammography at [facility] hospital 08/26/2012 showing a possible distortion in the right breast. Additional views of the breast Center 08/29/2012 confirmed a spiculated mass in the right breast measuring approximately 9 mm. This was not palpable. Ultrasound confirmed an irregular mass in the area in question, again measuring 9 mm. The right axilla was benign.  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 The patient's subsequent history is as detailed below.  INTERVAL HISTORY: Diane returns today for followup of her right breast carcinoma for which she recently underwent a right lumpectomy. She is ready to discuss her adjuvant chemotherapy, 4 cycles of docetaxel/Cytoxan given every 3 weeks, with her first dose is scheduled for this coming Monday, June 9. Diane has recovered well from her recent surgery, with no complications. She's had very little pain. She also had a port placed at the same time.   REVIEW OF SYSTEMS: Physically, Diane is feeling well. She occasionally has difficulty sleeping, for which she takes trazodone affectively. Her energy level is good. She's had no recent fevers, chills, or night sweats. She denies any skin changes or rashes and has had no abnormal bruising or bleeding. She is on chronic anticoagulation for an irregular heartbeat. She's eating well, with no nausea or change in bowel habits. She denies any cough, increased shortness of breath, or chest pain. She's had no abnormal headaches or dizziness. She has occasional cramping in her legs, as well as a history of back pain, but currently she denies any  pain, specifically no  unusual myalgias, arthralgias, or bony pain. She's had no peripheral swelling. At baseline, she denies any signs of peripheral neuropathy in either the upper or lower extremities.   A detailed review of systems is otherwise stable and noncontributory.    PAST MEDICAL HISTORY: Past Medical History  Diagnosis Date  . Lower back pain   . Herniated disc   . Arthritis   . Lumbar stenosis     L4-5  . Spondylolysis   . Coronary artery disease   . Hypertension   . Hyperlipemia   . 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 Breast cancer   . Depression   . Dysrhythmia     afib  . Shortness of breath     exertion  . Anxiety   . Diabetes mellitus without complication     diet  . Headache(784.0)     PAST SURGICAL HISTORY: Past Surgical History  Procedure Laterality Date  . Wide local excision  01/2010    Bilateral groin excisions, re-excision of vulva  . Posterior lumbar arthrodesis, pedicle screw fixation, posterolateral arthodesis  03/17/11  . Tonsillectomy    . Abdominal hysterectomy    . Dilation and curettage of uterus    . Tubal ligation    . Colonoscopy    . Shoulder arthroscopy with rotator cuff repair and subacromial decompression  06/04/2012    Procedure: SHOULDER ARTHROSCOPY WITH ROTATOR CUFF REPAIR AND SUBACROMIAL DECOMPRESSION;  Surgeon: Nita Sells, MD;  Location: [facility];  Service: Orthopedics;  Laterality: Left;  LEFT SHOULDER ARTHROSCOPY WITH ROTATOR CUFF REPAIR AND SUBACROMIAL DECOMPRESSION AND DISTAL CLAVICLE RESECTION  .  Partial mastectomy with needle localization and axillary sentinel lymph node bx Right 10/08/2012    Procedure: RIGHT PARTIAL MASTECTOMY WITH NEEDLE LOCALIZATION AND RIGHT AXILLARY SENTINEL LYMPH NODE BX;  Surgeon: Adin Hector, MD;  Location: [place];  Service: General;  Laterality: Right;  Injection of 1% Methylene Blue dye into right breast  . Portacath placement Left 10/08/2012    Procedure: INSERTION  PORT-A-CATH WITH ULTRASOUND GUIDANCE;  Surgeon: Adin Hector, MD;  Location: [place];  Service: General;  Laterality: Left;  Using 68F Powerport [place]    FAMILY HISTORY Family History  Problem Relation Age of Onset  . Stroke Mother   . Alzheimer's disease Mother   . Heart attack Father   . Heart attack Brother    the patient's father died from a heart attack at the age of 67. The patient's mother died at age 67 in the setting of Alzheimer's disease. Diane had 4 brothers, 3 sisters. There is no history of breast or ovarian cancer in the family to her knowledge.  GYNECOLOGIC HISTORY: Menarche age 54, first live birth age 60, she is [place] P3. She had a total abdominal hysterectomy with bilateral salpingo-oophorectomy in 1988. She took estrogen for approximately 30 years, stopping in January 2013.  SOCIAL HISTORY: Diane [name] used to work for [employer]. When she retired she moved to [facility], which is for her husband was from. He died approximately 11 years ago. The patient currently lives alone. Her daughter Diane [name] "hovers over many". Diane [name] works at [employer] on [employer] [employer]. The other 2 children are daughter Diane [name], who lives in [place] and works in a hospital, and Diane [name], who lives in [place] and is a housewife. The patient has 5 grandchildren. She is a Furniture conservator/restorer.  ADVANCED DIRECTIVES: Not in place  HEALTH MAINTENANCE: History  Substance Use Topics  . Smoking status: Former Smoker -- 50 years    Types: Cigarettes    Quit date: 05/29/2009  . Smokeless tobacco: Never Used  . Alcohol Use: No     Colonoscopy:  PAP:  Bone density: June 2011 at [facility]; normal  Lipid panel:  Allergies  Allergen Reactions  . Shellfish Allergy Swelling    Current Outpatient Prescriptions  Medication Sig Dispense Refill  . dabigatran (PRADAXA) 75 MG CAPS Take 75 mg by mouth every 12 (twelve) hours.      .  ferrous sulfate 325 (65 FE) MG tablet Take 325 mg by mouth daily with breakfast.      . FLUoxetine (PROZAC) 10 MG capsule Take 10 mg by mouth daily.      [name] Kitchen HYDROcodone-acetaminophen (NORCO/VICODIN) 5-325 MG per tablet Take 1-2 tablets by mouth every 4 (four) hours as needed for pain.  40 tablet  1  . nebivolol (BYSTOLIC) 5 MG tablet Take 5 mg by mouth daily.      [name] Kitchen NITROSTAT 0.4 MG SL tablet       . pravastatin (PRAVACHOL) 40 MG tablet Take 40 mg by mouth every evening.      . traZODone (DESYREL) 50 MG tablet Take 50 mg by mouth at bedtime.      . triamterene-hydrochlorothiazide (MAXZIDE-25) 37.5-25 MG per tablet Take 1 tablet by mouth daily.      [name] Kitchen dexamethasone (DECADRON) 4 MG tablet 2 tabs by mouth twice daily on day before and 3 days after each chemo dose  30 tablet  1  . lidocaine-prilocaine (EMLA) cream Apply to port 1-2  hours before each scheduled procedure  30 g  1  . LORazepam (ATIVAN) 0.5 MG tablet Take 1 tablet (0.5 mg total) by mouth at bedtime as needed for anxiety (Nausea or vomiting).  30 tablet  0  . ondansetron (ZOFRAN) 8 MG tablet 1 tab by mouth twice daily x 3 days after chemo, then every 12 hrs as needed for nause  30 tablet  1  . prochlorperazine (COMPAZINE) 10 MG tablet 1 tab by mouth with meals and bedtime x 2 days after each chemo, then every 6 hrs as needed for nausea  30 tablet  2  . tobramycin-dexamethasone (TOBRADEX) ophthalmic solution Place 1 drop into both eyes 2 (two) times daily. X 7 days after each chemo dose  5 mL  1   No current facility-administered medications for this visit.    OBJECTIVE: Middle-aged [place] American woman in no acute distress Filed Vitals:   10/30/12 1031  BP: 151/80  Pulse: 84  Temp: 97.4 F (36.3 C)  Resp: 20     Body mass index is 33.02 kg/(m^2).    ECOG FS: 0 Filed Weights   10/30/12 1031  Weight: 214 lb 1.6 oz (97.115 kg)    Sclerae unicteric Oropharynx clear, buccal mucosa pink and moist No cervical or supraclavicular  adenopathy Lungs clear to auscultation, no rales or rhonchi Heart regular rate and rhythm Abdomen soft, obese, nontender, positive bowel sounds MSK no focal spinal tenderness, no peripheral edema Neuro: nonfocal, well oriented, friendly affect Breasts: The right breast is status post  Lumpectomy, with well healing incision. No obvious skin changes, palpable masses, or evidence of local recurrence. No nipple inversion noted. Axillae are benign bilaterally, no palpable adenopathy Port is intact in the left upper chest wall, with well-healed incision, no erythema, edema, or evidence of infection.   LAB RESULTS: Lab Results  Component Value Date   WBC 6.2 10/30/2012   NEUTROABS 2.7 10/30/2012   HGB 11.7 10/30/2012   HCT 36.2 10/30/2012   MCV 83.6 10/30/2012   PLT 159 10/30/2012      Chemistry      Component Value Date/Time   NA 139 10/30/2012 1020   NA 139 10/03/2012 1441   K 3.8 10/30/2012 1020   K 3.2* 10/03/2012 1441   CL 104 10/30/2012 1020   CL 101 10/03/2012 1441   CO2 26 10/30/2012 1020   CO2 27 10/03/2012 1441   BUN 28.4* 10/30/2012 1020   BUN 19 10/03/2012 1441   CREATININE 1.5* 10/30/2012 1020   CREATININE 1.36* 10/03/2012 1441      Component Value Date/Time   CALCIUM 8.4 10/30/2012 1020   CALCIUM 9.2 10/03/2012 1441   ALKPHOS 81 10/30/2012 1020   ALKPHOS 65 10/03/2012 1441   AST 15 10/30/2012 1020   AST 22 10/03/2012 1441   ALT 16 10/30/2012 1020   ALT 9 10/03/2012 1441   BILITOT 0.27 10/30/2012 1020   BILITOT 0.2* 10/03/2012 1441       STUDIES:   Dg Chest Port 1 View  06-Nov-2012   *RADIOLOGY REPORT*  Clinical Data: Status post left chest wall port placement  PORTABLE CHEST - 1 VIEW  Comparison: 10/03/2012  Findings: A new left chest wall port is noted.  The catheter tip is noted at the cavoatrial junction in satisfactory position.  The port reservoir is somewhat medially located.  No underlying pneumothorax is seen.  The lungs are clear.  The cardiac shadow is within normal limits.  IMPRESSION: No evidence  of post  placement pneumothorax.   Original Report Authenticated By: Inez Catalina, M.D.     PATHOLOGY REPORT [id number]), 10/08/2012 Diagnosis 1. Breast, lumpectomy, right upper outer quadrant - INVASIVE GRADE III DUCTAL CARCINOMA ASSOCIATED WITH BIOPSY SITE CHANGES. - LARGEST RESIDUAL INVASIVE FOCUS MEASURES 0.1 CM IN GREATEST DIMENSION. - LYMPH/VASCULAR INVASION IS NOT IDENTIFIED. - MARGINS ARE NEGATIVE. - SEE ONCOLOGY TEMPLATE. 2. Lymph node, sentinel, biopsy, right - ONE BENIGN LYMPH NODE WITH NO TUMOR SEEN (0/1). Microscopic Comment 1. BREAST, INVASIVE TUMOR, WITH LYMPH NODE SAMPLING Specimen, including laterality: Right partial breast with sentinel lymph node biopsy. Procedure: Right breast lumpectomy with sentinel lymph node biopsy. Grade: III, see below comments. Tubule formation: II. Nuclear pleomorphism: III. Mitotic:III. Tumor size (glass slide measurement): Largest residual focus measures 0.1 cm. Margins: Invasive, distance to closest margin: 1.2 cm (anterior margin). Lymphovascular invasion: Not identified. 1 of 3 FINAL for Scarbro, Diane D [id number]) Microscopic Comment(continued) Ductal carcinoma in situ: Not identified in current specimen. Lobular neoplasia: No. Tumor focality: Unifocal. Treatment effect: Not applicable. Extent of tumor: Tumor confined to breast parenchyma. Lymph nodes: # examined: 1. Lymph nodes with metastasis: 0. Breast prognostic profile: Performed on previous case ([id number]) Estrogen receptor: 0%, negative. Progesterone receptor: 0%, negative. Her 2 neu: 0.91 ratio, not amplified. Ki-67: 73%. Additional breast findings: Usual ductal hyperplasia. TNM: pT1a, pN0, MX, see comment. Comments: As there is minimal residual invasive ductal carcinoma present on the lumpectomy specimen, both the tumor grade and tumor stage (pT) is obtained from re-review of the previous biopsy ([id number]). The largest linear distance of  invasive carcinoma measured as 0.4 cm on the previous biopsy and thus the tumor is staged as a pT1a. As the initial quantitative estrogen, quantitative progesterone, and Her-2 neu were negative, these markers will be performed on the focus of residual tumor and reported in an addendum. (RAH:caf 10/10/12) Willeen Niece MD Pathologist, Electronic Signature (Case signed 10/11/2012)    ASSESSMENT: 72 y.o. [place] woman   (1) s/p [facility] breast biopsy 08/30/2012 for a clinical T1b No, stage IA invasive ductal carcinoma, grade 3, triple negative, with an MIB-1 of 70%.  (2)  S/p right lumpectomy under the care of Dr. Dalbert Batman on 10/08/2012 for a pT1a, pN0, grade 3 invasive ductal carcinoma, with largest residual invasive focus measuring only 0.1 cm in greatest dimension. There was no lymphovascular invasion, with 0 of one lymph node involved. Margins were clear.  (3)  being treated in the adjuvant setting, the goal being to complete 4 q. three-week doses of docetaxel/Cytoxan, first dose is scheduled for 11/04/2012. Patient will receive Neulasta on day 2 of each cycle. Chemotherapy will be followed by radiation therapy.  PLAN: Almost all of our 1 hour appointment today was spent reviewing George's concerns, answering questions, reviewing her treatment plan, and coordinating care. She will return on Monday, June 9, for her first of 4 planned doses of adjuvant docetaxel/Cytoxan.  She'll return on day 2, June 10, for her Neulasta injection.  We reviewed George's antinausea regimen, and she was given all of her prescriptions, along with written information on how to utilize all of these drugs appropriately. Specifically, she was prescribed dexamethasone (2 tablets by mouth with food twice daily on the day before and 3 days after each chemotherapy); prochlorperazine (one tablet with meals and bedtime beginning the day of chemotherapy on continuing for 2 days after, then every 6 hours as needed for nausea);  ondansetron (one tablet twice daily for 3 days following chemotherapy); TobraDex eyedrops (1 drop in each  eye twice daily for 1 week after chemotherapy); lorazepam (one tablet at bedtime if needed for sleep); and EMLA cream. We also discussed using Claritin, and if needed pain medication, following the Neulasta injection on day 2.  I will see Diane when she returns on June 16 for assessment of chemotoxicity. We have scheduled her through the end of July, and of course our goal be to complete 4 doses of chemotherapy, which will be followed by radiation. She voices her understanding of this plan. She will call with any changes, questions, or problems.  Of note, Diane has a history of mildly elevated serum creatinine, and it was again elevated at 1.5 today. I have encouraged her to keep herself well hydrated, and we will follow this very closely, repeating a CMET when she returns on 11/04/2012.  BERRY,AMY    10/30/2012

## 2012-11-04 ENCOUNTER — Other Ambulatory Visit (HOSPITAL_BASED_OUTPATIENT_CLINIC_OR_DEPARTMENT_OTHER): Payer: Medicare Other

## 2012-11-04 ENCOUNTER — Ambulatory Visit (HOSPITAL_BASED_OUTPATIENT_CLINIC_OR_DEPARTMENT_OTHER): Payer: Medicare Other

## 2012-11-04 VITALS — BP 178/60 | HR 52 | Temp 98.0°F

## 2012-11-04 DIAGNOSIS — I10 Essential (primary) hypertension: Secondary | ICD-10-CM | POA: Diagnosis not present

## 2012-11-04 DIAGNOSIS — C50911 Malignant neoplasm of unspecified site of right female breast: Secondary | ICD-10-CM

## 2012-11-04 DIAGNOSIS — C50111 Malignant neoplasm of central portion of right female breast: Secondary | ICD-10-CM

## 2012-11-04 DIAGNOSIS — C50919 Malignant neoplasm of unspecified site of unspecified female breast: Secondary | ICD-10-CM | POA: Diagnosis not present

## 2012-11-04 DIAGNOSIS — C50119 Malignant neoplasm of central portion of unspecified female breast: Secondary | ICD-10-CM | POA: Diagnosis not present

## 2012-11-04 DIAGNOSIS — E785 Hyperlipidemia, unspecified: Secondary | ICD-10-CM | POA: Diagnosis not present

## 2012-11-04 DIAGNOSIS — N390 Urinary tract infection, site not specified: Secondary | ICD-10-CM | POA: Diagnosis not present

## 2012-11-04 DIAGNOSIS — R7989 Other specified abnormal findings of blood chemistry: Secondary | ICD-10-CM

## 2012-11-04 DIAGNOSIS — R319 Hematuria, unspecified: Secondary | ICD-10-CM | POA: Diagnosis not present

## 2012-11-04 DIAGNOSIS — Z5111 Encounter for antineoplastic chemotherapy: Secondary | ICD-10-CM | POA: Diagnosis not present

## 2012-11-04 LAB — CBC WITH DIFFERENTIAL/PLATELET
BASO%: 0.3 % (ref 0.0–2.0)
Basophils Absolute: 0 10*3/uL (ref 0.0–0.1)
EOS%: 0 % (ref 0.0–7.0)
Eosinophils Absolute: 0 10*3/uL (ref 0.0–0.5)
HCT: 35.8 % (ref 34.8–46.6)
HGB: 11.8 g/dL (ref 11.6–15.9)
LYMPH%: 13.6 % — ABNORMAL LOW (ref 14.0–49.7)
MCH: 26.8 pg (ref 25.1–34.0)
MCHC: 33 g/dL (ref 31.5–36.0)
MCV: 81.4 fL (ref 79.5–101.0)
MONO#: 0.5 10*3/uL (ref 0.1–0.9)
MONO%: 4.7 % (ref 0.0–14.0)
NEUT#: 8.7 10*3/uL — ABNORMAL HIGH (ref 1.5–6.5)
NEUT%: 81.4 % — ABNORMAL HIGH (ref 38.4–76.8)
Platelets: 180 10*3/uL (ref 145–400)
RBC: 4.4 10*6/uL (ref 3.70–5.45)
RDW: 15.1 % — ABNORMAL HIGH (ref 11.2–14.5)
WBC: 10.7 10*3/uL — ABNORMAL HIGH (ref 3.9–10.3)
lymph#: 1.5 10*3/uL (ref 0.9–3.3)
nRBC: 0 % (ref 0–0)

## 2012-11-04 LAB — COMPREHENSIVE METABOLIC PANEL (CC13)
ALT: 14 U/L (ref 0–55)
AST: 13 U/L (ref 5–34)
Albumin: 3.3 g/dL — ABNORMAL LOW (ref 3.5–5.0)
Alkaline Phosphatase: 70 U/L (ref 40–150)
BUN: 29.9 mg/dL — ABNORMAL HIGH (ref 7.0–26.0)
CO2: 23 mEq/L (ref 22–29)
Calcium: 9 mg/dL (ref 8.4–10.4)
Chloride: 105 mEq/L (ref 98–107)
Creatinine: 1.4 mg/dL — ABNORMAL HIGH (ref 0.6–1.1)
Glucose: 152 mg/dl — ABNORMAL HIGH (ref 70–99)
Potassium: 3.8 mEq/L (ref 3.5–5.1)
Sodium: 137 mEq/L (ref 136–145)
Total Bilirubin: 0.23 mg/dL (ref 0.20–1.20)
Total Protein: 7.3 g/dL (ref 6.4–8.3)

## 2012-11-04 MED ORDER — SODIUM CHLORIDE 0.9 % IV SOLN
600.0000 mg/m2 | Freq: Once | INTRAVENOUS | Status: AC
  Administered 2012-11-04: 1300 mg via INTRAVENOUS
  Filled 2012-11-04: qty 65

## 2012-11-04 MED ORDER — SODIUM CHLORIDE 0.9 % IV SOLN
75.0000 mg/m2 | Freq: Once | INTRAVENOUS | Status: AC
  Administered 2012-11-04: 160 mg via INTRAVENOUS
  Filled 2012-11-04: qty 16

## 2012-11-04 MED ORDER — HEPARIN SOD (PORK) LOCK FLUSH 100 UNIT/ML IV SOLN
500.0000 [IU] | Freq: Once | INTRAVENOUS | Status: AC | PRN
  Administered 2012-11-04: 500 [IU]
  Filled 2012-11-04: qty 5

## 2012-11-04 MED ORDER — ONDANSETRON 16 MG/50ML IVPB (CHCC)
16.0000 mg | Freq: Once | INTRAVENOUS | Status: AC
  Administered 2012-11-04: 16 mg via INTRAVENOUS

## 2012-11-04 MED ORDER — SODIUM CHLORIDE 0.9 % IJ SOLN
10.0000 mL | INTRAMUSCULAR | Status: DC | PRN
  Administered 2012-11-04: 10 mL
  Filled 2012-11-04: qty 10

## 2012-11-04 MED ORDER — DEXAMETHASONE SODIUM PHOSPHATE 20 MG/5ML IJ SOLN
20.0000 mg | Freq: Once | INTRAMUSCULAR | Status: AC
  Administered 2012-11-04: 20 mg via INTRAVENOUS

## 2012-11-04 MED ORDER — SODIUM CHLORIDE 0.9 % IV SOLN
Freq: Once | INTRAVENOUS | Status: AC
  Administered 2012-11-04: 11:00:00 via INTRAVENOUS

## 2012-11-04 NOTE — Patient Instructions (Signed)
[facility] Discharge Instructions for Patients Receiving Chemotherapy  Today you received the following chemotherapy agents taxotere, cytoxan  To help prevent nausea and vomiting after your treatment, we encourage you to take your nausea medication as needed   If you develop nausea and vomiting that is not controlled by your nausea medication, call the clinic.   BELOW ARE SYMPTOMS THAT SHOULD BE REPORTED IMMEDIATELY:  *FEVER GREATER THAN 100.5 F  *CHILLS WITH OR WITHOUT FEVER  NAUSEA AND VOMITING THAT IS NOT CONTROLLED WITH YOUR NAUSEA MEDICATION  *UNUSUAL SHORTNESS OF BREATH  *UNUSUAL BRUISING OR BLEEDING  TENDERNESS IN MOUTH AND THROAT WITH OR WITHOUT PRESENCE OF ULCERS  *URINARY PROBLEMS  *BOWEL PROBLEMS  UNUSUAL RASH Items with * indicate a potential emergency and should be followed up as soon as possible.  Feel free to call the clinic you have any questions or concerns. The clinic phone number is [phone].

## 2012-11-05 ENCOUNTER — Telehealth: Payer: Self-pay | Admitting: *Deleted

## 2012-11-05 ENCOUNTER — Ambulatory Visit (HOSPITAL_BASED_OUTPATIENT_CLINIC_OR_DEPARTMENT_OTHER): Payer: Medicare Other

## 2012-11-05 VITALS — BP 138/63 | HR 49 | Temp 98.0°F

## 2012-11-05 DIAGNOSIS — N390 Urinary tract infection, site not specified: Secondary | ICD-10-CM | POA: Diagnosis not present

## 2012-11-05 DIAGNOSIS — R319 Hematuria, unspecified: Secondary | ICD-10-CM | POA: Diagnosis not present

## 2012-11-05 DIAGNOSIS — I10 Essential (primary) hypertension: Secondary | ICD-10-CM | POA: Diagnosis not present

## 2012-11-05 DIAGNOSIS — C50119 Malignant neoplasm of central portion of unspecified female breast: Secondary | ICD-10-CM | POA: Diagnosis not present

## 2012-11-05 DIAGNOSIS — C50111 Malignant neoplasm of central portion of right female breast: Secondary | ICD-10-CM

## 2012-11-05 DIAGNOSIS — C50919 Malignant neoplasm of unspecified site of unspecified female breast: Secondary | ICD-10-CM | POA: Diagnosis not present

## 2012-11-05 DIAGNOSIS — E785 Hyperlipidemia, unspecified: Secondary | ICD-10-CM | POA: Diagnosis not present

## 2012-11-05 MED ORDER — PEGFILGRASTIM INJECTION 6 MG/0.6ML
6.0000 mg | Freq: Once | SUBCUTANEOUS | Status: AC
  Administered 2012-11-05: 6 mg via SUBCUTANEOUS
  Filled 2012-11-05: qty 0.6

## 2012-11-05 NOTE — Telephone Encounter (Signed)
Diane [name] here for Neulasta injection following 1st TC chemo treatment.  States that she is doing good.  No nausea, vomiting, or diarrhea.  Is drinking her water and eating light foods.  All questions answered.  Knows to call if she has any problems or concerns.

## 2012-11-05 NOTE — Patient Instructions (Signed)

## 2012-11-07 ENCOUNTER — Encounter: Payer: Self-pay | Admitting: Radiation Oncology

## 2012-11-07 NOTE — Progress Notes (Signed)
Location of Breast Cancer: Right Upper Outer Quadrant  Histology per Pathology Report:   1. Breast, lumpectomy, right upper outer quadrant - INVASIVE GRADE III DUCTAL CARCINOMA ASSOCIATED WITH BIOPSY SITE CHANGES. - LARGEST RESIDUAL INVASIVE FOCUS MEASURES 0.1 CM IN GREATEST DIMENSION. - LYMPH/VASCULAR INVASION IS NOT IDENTIFIED. - MARGINS ARE NEGATIVE. - SEE ONCOLOGY TEMPLATE. 2. Lymph node, sentinel, biopsy, right - ONE BENIGN LYMPH NODE WITH NO TUMOR SEEN (0/1).  Receptor Status: ER(0), PR (0), Her2-neu (Neg), Ki - 67 (73%)  Did patient present with symptoms (if so, please note symptoms) or was this found on screening mammography?: Found on annual screening  Past/Anticipated interventions by surgeon, if [id number] Lumpectomy  Past/Anticipated interventions by medical oncology, if any: Chemotherapy-  6/9 14 started Docetaxel/Cytoxan Q 3 Weeks x 4 cycles  Lymphedema issues, if any:    Pain issues, if any:  No Pain  SAFETY ISSUES:  Prior radiation? no  Pacemaker/ICD? no  Possible current pregnancy?no  Is the patient on methotrexate? no  Current Complaints / other details:  Past hx of squamous cell carcinoma of the vulva - Surgery    C[place]on, Crista Curb, RN 11/07/2012,5:18 PM

## 2012-11-08 ENCOUNTER — Ambulatory Visit
Admission: RE | Admit: 2012-11-08 | Discharge: 2012-11-08 | Disposition: A | Discharge status: Home / Self Care | Payer: Medicare Other | Source: Ambulatory Visit | Attending: Radiation Oncology | Admitting: Radiation Oncology

## 2012-11-08 ENCOUNTER — Other Ambulatory Visit: Payer: Self-pay | Admitting: Physician Assistant

## 2012-11-08 ENCOUNTER — Encounter: Payer: Self-pay | Admitting: Radiation Oncology

## 2012-11-08 VITALS — Ht 67.5 in | Wt 214.6 lb

## 2012-11-08 DIAGNOSIS — Z79899 Other long term (current) drug therapy: Secondary | ICD-10-CM | POA: Diagnosis not present

## 2012-11-08 DIAGNOSIS — C50419 Malignant neoplasm of upper-outer quadrant of unspecified female breast: Secondary | ICD-10-CM | POA: Insufficient documentation

## 2012-11-08 DIAGNOSIS — Z171 Estrogen receptor negative status [ER-]: Secondary | ICD-10-CM | POA: Diagnosis not present

## 2012-11-08 DIAGNOSIS — C50111 Malignant neoplasm of central portion of right female breast: Secondary | ICD-10-CM

## 2012-11-08 DIAGNOSIS — C50119 Malignant neoplasm of central portion of unspecified female breast: Secondary | ICD-10-CM | POA: Diagnosis not present

## 2012-11-08 NOTE — Progress Notes (Signed)
Radiation Oncology         [phone] ________________________________  Name: Diane [name] MRN: [id number]  Date: 11/08/2012  DOB: [birth year]  Follow-Up Visit Note  Outpatient  CC: Myrtis Ser, MD  Adin Hector, MD  Diagnosis:   pT1aN0M0 Invasive Ductal Carcinoma, Grade III triple Negative, right breast  Narrative:  The patient returns today for follow-up.    She underwent surgery in the form of lumpectomy and SLN bx on 10/08/2012.  Pathology revealed a 0.1 cm focus of residual disease. Sentinel lymph node was negative. Her margins are clear. No lymphovascular space invasion. She is healing well.  Postoperatively, she is doing relatively well.  She complains of some fatigue from her first cycle of chemotherapy which has started to resolve. In terms of chemotherapy goal being to complete 4 q. three-week doses of docetaxel/Cytoxan, first dose was given 11/04/2012.                   ALLERGIES:  is allergic to shellfish allergy.  Meds: Current Outpatient Prescriptions  Medication Sig Dispense Refill  . dabigatran (PRADAXA) 75 MG CAPS Take 75 mg by mouth every 12 (twelve) hours.      [name] Kitchen dexamethasone (DECADRON) 4 MG tablet 2 tabs by mouth twice daily on day before and 3 days after each chemo dose  30 tablet  1  . ferrous sulfate 325 (65 FE) MG tablet Take 325 mg by mouth daily with breakfast.      . FLUoxetine (PROZAC) 10 MG capsule Take 10 mg by mouth daily.      [name] Kitchen HYDROcodone-acetaminophen (NORCO/VICODIN) 5-325 MG per tablet Take 1-2 tablets by mouth every 4 (four) hours as needed for pain.  40 tablet  1  . lidocaine-prilocaine (EMLA) cream Apply to port 1-2 hours before each scheduled procedure  30 g  1  . LORazepam (ATIVAN) 0.5 MG tablet Take 1 tablet (0.5 mg total) by mouth at bedtime as needed for anxiety (Nausea or vomiting).  30 tablet  0  . nebivolol (BYSTOLIC) 5 MG tablet Take 5 mg by mouth daily.      [name] Kitchen NITROSTAT 0.4 MG SL tablet       . ondansetron  (ZOFRAN) 8 MG tablet 1 tab by mouth twice daily x 3 days after chemo, then every 12 hrs as needed for nause  30 tablet  1  . pravastatin (PRAVACHOL) 40 MG tablet Take 40 mg by mouth every evening.      . prochlorperazine (COMPAZINE) 10 MG tablet 1 tab by mouth with meals and bedtime x 2 days after each chemo, then every 6 hrs as needed for nausea  30 tablet  2  . tobramycin-dexamethasone (TOBRADEX) ophthalmic solution Place 1 drop into both eyes 2 (two) times daily. X 7 days after each chemo dose  5 mL  1  . traZODone (DESYREL) 50 MG tablet Take 50 mg by mouth at bedtime.      . triamterene-hydrochlorothiazide (MAXZIDE-25) 37.5-25 MG per tablet Take 1 tablet by mouth daily.       No current facility-administered medications for this encounter.    Physical Findings: The patient is in no acute distress. Patient is alert and oriented.  height is 5' 7.5" (1.715 m) and weight is 214 lb 9.6 oz (97.342 kg). .  Right breast notable for a very small seroma underneath the upper outer quadrant lumpectomy scar. Axillary scar healing well  Lab Findings: Lab Results  Component Value Date   WBC 10.7* 11/04/2012  HGB 11.8 11/04/2012   HCT 35.8 11/04/2012   MCV 81.4 11/04/2012   PLT 180 11/04/2012      Radiographic Findings: No results found.  Impression/Plan:  Lovely patient with pathologic T1a N0 right breast cancer, triple negative, grade 3  We discussed adjuvant radiotherapy today.  I recommend that she first complete her 4 cycles of chemotherapy. Subsequently, I recommend that she receive adjuvant radiotherapy to the right breast over 6-7 weeks decrease her chance of local recurrence by about two thirds.  The risks, benefits and side effects of this treatment were discussed in detail.  She understands that radiotherapy is associated with skin irritation and fatigue in the acute setting. Late effects can include cosmetic changes and rare injury to internal organs.   She is enthusiastic about proceeding with  treatment. A consent form has been signed and placed in her chart. I would appreciate for the patient to be referred back to me by med/onc for simulation when she is done with her chemotherapy.  I spent 25 minutes face to face with the patient and more than 50% of that time was spent in counseling and/or coordination of care. _____________________________________   Eppie Gibson, MD

## 2012-11-08 NOTE — Progress Notes (Signed)
Please see the Nurse Progress Note in the MD Initial Consult Encounter for this patient. 

## 2012-11-11 ENCOUNTER — Other Ambulatory Visit (HOSPITAL_BASED_OUTPATIENT_CLINIC_OR_DEPARTMENT_OTHER): Payer: Medicare Other | Admitting: Lab

## 2012-11-11 ENCOUNTER — Ambulatory Visit (HOSPITAL_BASED_OUTPATIENT_CLINIC_OR_DEPARTMENT_OTHER): Payer: Medicare Other | Admitting: Physician Assistant

## 2012-11-11 ENCOUNTER — Encounter: Payer: Self-pay | Admitting: Physician Assistant

## 2012-11-11 VITALS — BP 101/68 | HR 60 | Temp 98.1°F | Resp 20 | Ht 67.5 in | Wt 208.9 lb

## 2012-11-11 DIAGNOSIS — C50911 Malignant neoplasm of unspecified site of right female breast: Secondary | ICD-10-CM

## 2012-11-11 DIAGNOSIS — B370 Candidal stomatitis: Secondary | ICD-10-CM

## 2012-11-11 DIAGNOSIS — E785 Hyperlipidemia, unspecified: Secondary | ICD-10-CM | POA: Diagnosis not present

## 2012-11-11 DIAGNOSIS — C50419 Malignant neoplasm of upper-outer quadrant of unspecified female breast: Secondary | ICD-10-CM | POA: Diagnosis not present

## 2012-11-11 DIAGNOSIS — R197 Diarrhea, unspecified: Secondary | ICD-10-CM | POA: Diagnosis not present

## 2012-11-11 DIAGNOSIS — C519 Malignant neoplasm of vulva, unspecified: Secondary | ICD-10-CM

## 2012-11-11 DIAGNOSIS — C50111 Malignant neoplasm of central portion of right female breast: Secondary | ICD-10-CM

## 2012-11-11 DIAGNOSIS — Z8544 Personal history of malignant neoplasm of other female genital organs: Secondary | ICD-10-CM | POA: Diagnosis not present

## 2012-11-11 DIAGNOSIS — C50919 Malignant neoplasm of unspecified site of unspecified female breast: Secondary | ICD-10-CM

## 2012-11-11 DIAGNOSIS — N390 Urinary tract infection, site not specified: Secondary | ICD-10-CM | POA: Diagnosis not present

## 2012-11-11 DIAGNOSIS — C50119 Malignant neoplasm of central portion of unspecified female breast: Secondary | ICD-10-CM | POA: Diagnosis not present

## 2012-11-11 DIAGNOSIS — R319 Hematuria, unspecified: Secondary | ICD-10-CM | POA: Diagnosis not present

## 2012-11-11 DIAGNOSIS — Z5111 Encounter for antineoplastic chemotherapy: Secondary | ICD-10-CM | POA: Diagnosis not present

## 2012-11-11 DIAGNOSIS — R350 Frequency of micturition: Secondary | ICD-10-CM | POA: Diagnosis not present

## 2012-11-11 DIAGNOSIS — I10 Essential (primary) hypertension: Secondary | ICD-10-CM | POA: Diagnosis not present

## 2012-11-11 DIAGNOSIS — Z171 Estrogen receptor negative status [ER-]: Secondary | ICD-10-CM | POA: Diagnosis not present

## 2012-11-11 LAB — CBC WITH DIFFERENTIAL/PLATELET
BASO%: 0.6 % (ref 0.0–2.0)
Basophils Absolute: 0.1 10*3/uL (ref 0.0–0.1)
EOS%: 0.6 % (ref 0.0–7.0)
Eosinophils Absolute: 0.1 10*3/uL (ref 0.0–0.5)
HCT: 38.2 % (ref 34.8–46.6)
HGB: 13 g/dL (ref 11.6–15.9)
LYMPH%: 26.2 % (ref 14.0–49.7)
MCH: 27 pg (ref 25.1–34.0)
MCHC: 34 g/dL (ref 31.5–36.0)
MCV: 79.4 fL — ABNORMAL LOW (ref 79.5–101.0)
MONO#: 1.6 10*3/uL — ABNORMAL HIGH (ref 0.1–0.9)
MONO%: 17.9 % — ABNORMAL HIGH (ref 0.0–14.0)
NEUT#: 5 10*3/uL (ref 1.5–6.5)
NEUT%: 54.7 % (ref 38.4–76.8)
Platelets: 132 10*3/uL — ABNORMAL LOW (ref 145–400)
RBC: 4.81 10*6/uL (ref 3.70–5.45)
RDW: 14.2 % (ref 11.2–14.5)
WBC: 9 10*3/uL (ref 3.9–10.3)
lymph#: 2.4 10*3/uL (ref 0.9–3.3)
nRBC: 0 % (ref 0–0)

## 2012-11-11 MED ORDER — NYSTATIN 100000 UNIT/ML MT SUSP: 500000.0000 [IU] | Freq: Four times a day (QID) | OROMUCOSAL | Status: DC

## 2012-11-11 MED ORDER — CHOLESTYRAMINE 4 G PO PACK: 1.0000 | PACK | Freq: Two times a day (BID) | ORAL | Status: DC

## 2012-11-11 NOTE — Addendum Note (Signed)
Encounter addended by: Rebecca Eaton, RN on: 11/11/2012  9:36 AM<BR>     Documentation filed: Charges VN

## 2012-11-11 NOTE — Progress Notes (Signed)
ID: Diane [name]   DOB: [birth year]  MR#: [id number]  CSN#:[id number]  PCP: Diane Ser, MD GYN:  SU: Diane [name] OTHER MD: Diane [name], Diane [name], Diane [name]   HISTORY OF PRESENT ILLNESS: Diane had routine screening mammography at [facility] hospital 08/26/2012 showing a possible distortion in the right breast. Additional views of the breast Center 08/29/2012 confirmed a spiculated mass in the right breast measuring approximately 9 mm. This was not palpable. Ultrasound confirmed an irregular mass in the area in question, again measuring 9 mm. The right axilla was benign.  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 The patient's subsequent history is as detailed below.  INTERVAL HISTORY: Diane returns today accompanied by her 70-year-old great grandson, Diane [name], for followup of her right breast carcinoma for which she recently underwent a right lumpectomy. She initiated adjuvant chemotherapy 1 week ago, and is currently day 8 cycle 1 of 4 planned q. three-week doses of docetaxel/cyclophosphamide being given in the adjuvant setting. She receives Neulasta on day 2 for granulocyte support.  Diane is feeling poorly today. She is extremely tired. She took all of her antinausea medications as corrected. Her appetite is reduced, and she has had some continued problems with queasiness and nausea. Fortunately, she's had no actual emesis. Her biggest complaint today is diarrhea. She is finding it difficult to keep herself hydrated, but is trying to do so. She's lost approximately 7-8 pounds this week. She tells me everything she eats or drinks "goes straight through", with watery stools. There's been no blood or mucus in the stools. She denies any abdominal cramping. She's had no fevers, chills, or night sweats.   REVIEW OF SYSTEMS: Diane denies any rashes, abnormal bruising,  or abnormal bleeding. (She continues on chronic anti-coagulation for an irregular heartbeat.) Her mouth is sore, and she noticed a "white coating" on her tongue last week following chemotherapy. She denies any cough, pleurisy, chest pain, palpitation. She denies any abnormal headaches or dizziness. She currently denies any signs of numbness or tingling in the upper or lower extremities. She denies any unusual myalgias, arthralgias, or bony pain, other than her usual back pain, and she has had no peripheral swelling.  A detailed review of systems is otherwise stable and noncontributory.    PAST MEDICAL HISTORY: Past Medical History  Diagnosis Date  . Lower back pain   . Herniated disc   . Arthritis   . Lumbar stenosis     L4-5  . Spondylolysis   . Coronary artery disease   . Hypertension   . Hyperlipemia   . 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 Breast cancer   . Depression   . Dysrhythmia     afib  . Shortness of breath     exertion  . Anxiety   . Diabetes mellitus without complication     diet  . Headache(784.0)   . On antineoplastic chemotherapy Start 11/04/12    Docetaxel/Cytoxan Q 3 Weeks x 4 cycles    PAST SURGICAL HISTORY: Past Surgical History  Procedure Laterality Date  . Wide local excision  01/2010    Bilateral groin excisions, re-excision of vulva  . Posterior lumbar arthrodesis, pedicle screw fixation, posterolateral arthodesis  03/17/11  . Tonsillectomy    . Abdominal hysterectomy    . Dilation and curettage of uterus    . Tubal ligation    . Colonoscopy    .  Shoulder arthroscopy with rotator cuff repair and subacromial decompression  06/04/2012    Procedure: SHOULDER ARTHROSCOPY WITH ROTATOR CUFF REPAIR AND SUBACROMIAL DECOMPRESSION;  Surgeon: Diane Sells, MD;  Location: [facility];  Service: Orthopedics;  Laterality: Left;  LEFT SHOULDER ARTHROSCOPY WITH ROTATOR CUFF REPAIR AND SUBACROMIAL DECOMPRESSION AND DISTAL CLAVICLE  RESECTION  . Partial mastectomy with needle localization and axillary sentinel lymph node bx Right 10/08/2012    Procedure: RIGHT PARTIAL MASTECTOMY WITH NEEDLE LOCALIZATION AND RIGHT AXILLARY SENTINEL LYMPH NODE BX;  Surgeon: Diane Hector, MD;  Location: [facility];  Service: General;  Laterality: Right;  Injection of 1% Methylene Blue dye into right breast  . Portacath placement Left 10/08/2012    Procedure: INSERTION PORT-A-CATH WITH ULTRASOUND GUIDANCE;  Surgeon: Diane Hector, MD;  Location: [facility];  Service: General;  Laterality: Left;  Using 48F [place]  . Vulvectomy partial      FAMILY HISTORY Family History  Problem Relation Age of Onset  . Stroke Mother   . Alzheimer's disease Mother   . Heart attack Father   . Heart attack Brother    the patient's father died from a heart attack at the age of 86. The patient's mother died at age 22 in the setting of Alzheimer's disease. Diane had 4 brothers, 3 sisters. There is no history of breast or ovarian cancer in the family to her knowledge.  GYNECOLOGIC HISTORY: Menarche age 70, first live birth age 65, she is Diane [name] P3. She had a total abdominal hysterectomy with bilateral salpingo-oophorectomy in 1988. She took estrogen for approximately 30 years, stopping in January 2013.  SOCIAL HISTORY: Diane [name] used to work for [employer]. When she retired she moved to [facility], which is for her husband was from. He died approximately 11 years ago. The patient currently lives alone. Her daughter Diane [name] "hovers over many". Diane [name] works at [employer] on [address] [employer]. The other 2 children are daughter Diane [name], who lives in [place] and works in a hospital, and Diane [name], who lives in [place] and is a housewife. The patient has 5 grandchildren. She is a Furniture conservator/restorer.  ADVANCED DIRECTIVES: Not in place  HEALTH MAINTENANCE: History  Substance Use Topics  . Smoking status:  Former Smoker -- 50 years    Types: Cigarettes    Quit date: 05/29/2009  . Smokeless tobacco: Never Used  . Alcohol Use: No     Colonoscopy:  PAP:  Bone density: June 2011 at [facility] hospital; normal  Lipid panel:  Allergies  Allergen Reactions  . Shellfish Allergy Swelling    Current Outpatient Prescriptions  Medication Sig Dispense Refill  . dabigatran (PRADAXA) 75 MG CAPS Take 75 mg by mouth every 12 (twelve) hours.      [name] Kitchen dexamethasone (DECADRON) 4 MG tablet 2 tabs by mouth twice daily on day before and 3 days after each chemo dose  30 tablet  1  . ferrous sulfate 325 (65 FE) MG tablet Take 325 mg by mouth daily with breakfast.      . FLUoxetine (PROZAC) 10 MG capsule Take 10 mg by mouth daily.      [name] Kitchen HYDROcodone-acetaminophen (NORCO/VICODIN) 5-325 MG per tablet Take 1-2 tablets by mouth every 4 (four) hours as needed for pain.  40 tablet  1  . lidocaine-prilocaine (EMLA) cream Apply to port 1-2 hours before each scheduled procedure  30 g  1  . LORazepam (ATIVAN) 0.5 MG tablet Take  1 tablet (0.5 mg total) by mouth at bedtime as needed for anxiety (Nausea or vomiting).  30 tablet  0  . nebivolol (BYSTOLIC) 5 MG tablet Take 5 mg by mouth daily.      [name] Kitchen NITROSTAT 0.4 MG SL tablet       . ondansetron (ZOFRAN) 8 MG tablet 1 tab by mouth twice daily x 3 days after chemo, then every 12 hrs as needed for nause  30 tablet  1  . pravastatin (PRAVACHOL) 40 MG tablet Take 40 mg by mouth every evening.      . prochlorperazine (COMPAZINE) 10 MG tablet 1 tab by mouth with meals and bedtime x 2 days after each chemo, then every 6 hrs as needed for nausea  30 tablet  2  . tobramycin-dexamethasone (TOBRADEX) ophthalmic solution Place 1 drop into both eyes 2 (two) times daily. X 7 days after each chemo dose  5 mL  1  . traZODone (DESYREL) 50 MG tablet Take 50 mg by mouth at bedtime.      . triamterene-hydrochlorothiazide (MAXZIDE-25) 37.5-25 MG per tablet Take 1 tablet by mouth daily.      .  cholestyramine (QUESTRAN) 4 G packet Take 1 packet by mouth 2 (two) times daily with a meal. For diarrhea  60 each  12  . nystatin (MYCOSTATIN) 100000 UNIT/ML suspension Take 5 mLs (500,000 Units total) by mouth 4 (four) times daily. (swish and swallow)  240 mL  0   No current facility-administered medications for this visit.    OBJECTIVE: Middle-aged [place] American woman in no acute distress Filed Vitals:   11/11/12 1326  BP: 101/68  Pulse: 60  Temp: 98.1 F (36.7 C)  Resp: 20     Body mass index is 32.22 kg/(m^2).    ECOG FS: 2 Filed Weights   11/11/12 1326  Weight: 208 lb 14.4 oz (94.756 kg)    Sclerae unicteric Oropharynx notable for small white patches in the posterior oropharynx, no ulcerations noted No cervical or supraclavicular adenopathy Lungs clear to auscultation, no rales or rhonchi Heart regular rate and rhythm Abdomen soft, obese, nontender to palpation, positive bowel sounds MSK no focal spinal tenderness, no peripheral edema Neuro: nonfocal, well oriented,  tired affect Breasts: Deferred. Axillae are benign bilaterally, no palpable adenopathy Port is intact in the left upper chest wall, with well-healed incision, no erythema, edema, or evidence of infection.   LAB RESULTS: Lab Results  Component Value Date   WBC 9.0 11/11/2012   NEUTROABS 5.0 11/11/2012   HGB 13.0 11/11/2012   HCT 38.2 11/11/2012   MCV 79.4* 11/11/2012   PLT 132* 11/11/2012      Chemistry      Component Value Date/Time   NA 137 11/04/2012 0937   NA 139 10/03/2012 1441   K 3.8 11/04/2012 0937   K 3.2* 10/03/2012 1441   CL 105 11/04/2012 0937   CL 101 10/03/2012 1441   CO2 23 11/04/2012 0937   CO2 27 10/03/2012 1441   BUN 29.9* 11/04/2012 0937   BUN 19 10/03/2012 1441   CREATININE 1.4* 11/04/2012 0937   CREATININE 1.36* 10/03/2012 1441      Component Value Date/Time   CALCIUM 9.0 11/04/2012 0937   CALCIUM 9.2 10/03/2012 1441   ALKPHOS 70 11/04/2012 0937   ALKPHOS 65 10/03/2012 1441   AST 13 11/04/2012 0937    AST 22 10/03/2012 1441   ALT 14 11/04/2012 0937   ALT 9 10/03/2012 1441   BILITOT 0.23 11/04/2012 [id number]  BILITOT 0.2* 10/03/2012 1441       STUDIES:  No results found.    ASSESSMENT: 72 y.o. [place] woman   (1) s/p [facility] breast biopsy 08/30/2012 for a clinical T1b No, stage IA invasive ductal carcinoma, grade 3, triple negative, with an MIB-1 of 70%.  (2)  S/p right lumpectomy under the care of Dr. Dalbert Batman on 10/08/2012 for a pT1a, pN0, grade 3 invasive ductal carcinoma, with largest residual invasive focus measuring only 0.1 cm in greatest dimension. There was no lymphovascular invasion, with 0 of one lymph node involved. Margins were clear.  (3)  being treated in the adjuvant setting, the goal being to complete 4 q. three-week doses of docetaxel/Cytoxan, first dose on 11/04/2012. Patient will receive Neulasta on day 2 of each cycle. Chemotherapy will be followed by radiation therapy.  PLAN: I have recommended that Diane obtain a stool specimen to test for C. difficile. I think that likely her diarrhea is going to be associated strictly with the chemotherapy, but still would be prudent to confirm. Until we get results back, I have prescribed Questran to use twice daily in place of Imodium. If the C. Difficile is negative, she may also utilize Imodium as needed for the diarrhea. I have encouraged her to keep herself extremely well hydrated and reiterated the importance of doing so. She voiced her understanding.  With regards to the nausea, I reminded Diane that she may continue taking the Compazine on an as needed basis, one tablet every 6 hours, for nausea. She can also utilizes lorazepam on occasion for continued nausea. She was started on nystatin suspension for mild thrush. Resolving that issue may also help with her queasiness and improve her appetite.   I will see Diane again in 2 weeks on June 30 in anticipation of day 1 cycle 2 of adjuvant chemotherapy. We have scheduled her through  the end of July, and of course our goal will be to complete 4 doses of chemotherapy, which will be followed by radiation. She voices her understanding of this plan. She will call with any changes, questions, or problems.   BERRY,AMY    11/11/2012

## 2012-11-12 ENCOUNTER — Ambulatory Visit: Payer: Medicare Other | Admitting: Lab

## 2012-11-12 DIAGNOSIS — C50111 Malignant neoplasm of central portion of right female breast: Secondary | ICD-10-CM

## 2012-11-12 DIAGNOSIS — R197 Diarrhea, unspecified: Secondary | ICD-10-CM

## 2012-11-12 LAB — CLOSTRIDIUM DIFFICILE BY PCR: Toxigenic C. Difficile by PCR: NEGATIVE

## 2012-11-18 DIAGNOSIS — M25519 Pain in unspecified shoulder: Secondary | ICD-10-CM | POA: Diagnosis not present

## 2012-11-25 ENCOUNTER — Other Ambulatory Visit (HOSPITAL_BASED_OUTPATIENT_CLINIC_OR_DEPARTMENT_OTHER): Payer: Medicare Other | Admitting: Lab

## 2012-11-25 ENCOUNTER — Ambulatory Visit (HOSPITAL_BASED_OUTPATIENT_CLINIC_OR_DEPARTMENT_OTHER): Payer: Medicare Other

## 2012-11-25 ENCOUNTER — Ambulatory Visit (HOSPITAL_BASED_OUTPATIENT_CLINIC_OR_DEPARTMENT_OTHER): Payer: Medicare Other | Admitting: Lab

## 2012-11-25 ENCOUNTER — Encounter: Payer: Self-pay | Admitting: Physician Assistant

## 2012-11-25 ENCOUNTER — Telehealth: Payer: Self-pay | Admitting: *Deleted

## 2012-11-25 ENCOUNTER — Ambulatory Visit (HOSPITAL_BASED_OUTPATIENT_CLINIC_OR_DEPARTMENT_OTHER): Payer: Medicare Other | Admitting: Physician Assistant

## 2012-11-25 VITALS — BP 121/74 | HR 75 | Temp 98.2°F | Resp 20 | Ht 67.5 in | Wt 213.5 lb

## 2012-11-25 DIAGNOSIS — C50119 Malignant neoplasm of central portion of unspecified female breast: Secondary | ICD-10-CM | POA: Diagnosis not present

## 2012-11-25 DIAGNOSIS — C50111 Malignant neoplasm of central portion of right female breast: Secondary | ICD-10-CM

## 2012-11-25 DIAGNOSIS — N390 Urinary tract infection, site not specified: Secondary | ICD-10-CM | POA: Diagnosis not present

## 2012-11-25 DIAGNOSIS — R350 Frequency of micturition: Secondary | ICD-10-CM

## 2012-11-25 DIAGNOSIS — Z8544 Personal history of malignant neoplasm of other female genital organs: Secondary | ICD-10-CM

## 2012-11-25 DIAGNOSIS — I10 Essential (primary) hypertension: Secondary | ICD-10-CM | POA: Diagnosis not present

## 2012-11-25 DIAGNOSIS — R197 Diarrhea, unspecified: Secondary | ICD-10-CM

## 2012-11-25 DIAGNOSIS — Z5111 Encounter for antineoplastic chemotherapy: Secondary | ICD-10-CM

## 2012-11-25 DIAGNOSIS — C50919 Malignant neoplasm of unspecified site of unspecified female breast: Secondary | ICD-10-CM

## 2012-11-25 DIAGNOSIS — R319 Hematuria, unspecified: Secondary | ICD-10-CM | POA: Diagnosis not present

## 2012-11-25 DIAGNOSIS — E785 Hyperlipidemia, unspecified: Secondary | ICD-10-CM | POA: Diagnosis not present

## 2012-11-25 DIAGNOSIS — C519 Malignant neoplasm of vulva, unspecified: Secondary | ICD-10-CM

## 2012-11-25 DIAGNOSIS — D649 Anemia, unspecified: Secondary | ICD-10-CM

## 2012-11-25 LAB — CBC WITH DIFFERENTIAL/PLATELET
BASO%: 1.2 % (ref 0.0–2.0)
Basophils Absolute: 0.1 10*3/uL (ref 0.0–0.1)
EOS%: 3.2 % (ref 0.0–7.0)
Eosinophils Absolute: 0.2 10*3/uL (ref 0.0–0.5)
HCT: 33.3 % — ABNORMAL LOW (ref 34.8–46.6)
HGB: 10.9 g/dL — ABNORMAL LOW (ref 11.6–15.9)
LYMPH%: 33 % (ref 14.0–49.7)
MCH: 26.7 pg (ref 25.1–34.0)
MCHC: 32.7 g/dL (ref 31.5–36.0)
MCV: 81.4 fL (ref 79.5–101.0)
MONO#: 0.7 10*3/uL (ref 0.1–0.9)
MONO%: 13.2 % (ref 0.0–14.0)
NEUT#: 2.8 10*3/uL (ref 1.5–6.5)
NEUT%: 49.4 % (ref 38.4–76.8)
Platelets: 323 10*3/uL (ref 145–400)
RBC: 4.09 10*6/uL (ref 3.70–5.45)
RDW: 15.5 % — ABNORMAL HIGH (ref 11.2–14.5)
WBC: 5.6 10*3/uL (ref 3.9–10.3)
lymph#: 1.9 10*3/uL (ref 0.9–3.3)
nRBC: 0 % (ref 0–0)

## 2012-11-25 LAB — URINALYSIS, MICROSCOPIC - CHCC
Bilirubin (Urine): NEGATIVE
Blood: NEGATIVE
Glucose: NEGATIVE mg/dL
Ketones: NEGATIVE mg/dL
Nitrite: NEGATIVE
Protein: NEGATIVE mg/dL
RBC / HPF: NEGATIVE (ref 0–2)
Specific Gravity, Urine: 1.005 (ref 1.003–1.035)
Urobilinogen, UR: 0.2 mg/dL (ref 0.2–1)
pH: 6 (ref 4.6–8.0)

## 2012-11-25 LAB — COMPREHENSIVE METABOLIC PANEL (CC13)
ALT: 14 U/L (ref 0–55)
AST: 14 U/L (ref 5–34)
Albumin: 3.2 g/dL — ABNORMAL LOW (ref 3.5–5.0)
Alkaline Phosphatase: 67 U/L (ref 40–150)
BUN: 16.8 mg/dL (ref 7.0–26.0)
CO2: 27 mEq/L (ref 22–29)
Calcium: 9 mg/dL (ref 8.4–10.4)
Chloride: 101 mEq/L (ref 98–109)
Creatinine: 1.2 mg/dL — ABNORMAL HIGH (ref 0.6–1.1)
Glucose: 93 mg/dl (ref 70–140)
Potassium: 3.3 mEq/L — ABNORMAL LOW (ref 3.5–5.1)
Sodium: 138 mEq/L (ref 136–145)
Total Bilirubin: 0.3 mg/dL (ref 0.20–1.20)
Total Protein: 7.2 g/dL (ref 6.4–8.3)

## 2012-11-25 MED ORDER — DEXAMETHASONE SODIUM PHOSPHATE 20 MG/5ML IJ SOLN
20.0000 mg | Freq: Once | INTRAMUSCULAR | Status: AC
  Administered 2012-11-25: 20 mg via INTRAVENOUS

## 2012-11-25 MED ORDER — ONDANSETRON 16 MG/50ML IVPB (CHCC)
16.0000 mg | Freq: Once | INTRAVENOUS | Status: AC
  Administered 2012-11-25: 16 mg via INTRAVENOUS

## 2012-11-25 MED ORDER — SODIUM CHLORIDE 0.9 % IV SOLN
Freq: Once | INTRAVENOUS | Status: AC
  Administered 2012-11-25: 14:00:00 via INTRAVENOUS

## 2012-11-25 MED ORDER — HEPARIN SOD (PORK) LOCK FLUSH 100 UNIT/ML IV SOLN
500.0000 [IU] | Freq: Once | INTRAVENOUS | Status: AC | PRN
  Administered 2012-11-25: 500 [IU]
  Filled 2012-11-25: qty 5

## 2012-11-25 MED ORDER — SODIUM CHLORIDE 0.9 % IJ SOLN
10.0000 mL | INTRAMUSCULAR | Status: DC | PRN
  Administered 2012-11-25: 10 mL
  Filled 2012-11-25: qty 10

## 2012-11-25 MED ORDER — SODIUM CHLORIDE 0.9 % IV SOLN
75.0000 mg/m2 | Freq: Once | INTRAVENOUS | Status: AC
  Administered 2012-11-25: 160 mg via INTRAVENOUS
  Filled 2012-11-25: qty 16

## 2012-11-25 MED ORDER — SODIUM CHLORIDE 0.9 % IV SOLN
600.0000 mg/m2 | Freq: Once | INTRAVENOUS | Status: AC
  Administered 2012-11-25: 1300 mg via INTRAVENOUS
  Filled 2012-11-25: qty 65

## 2012-11-25 NOTE — Patient Instructions (Addendum)
[facility] Discharge Instructions for Patients Receiving Chemotherapy  Today you received the following chemotherapy agents :  Taxotere, Cytoxan.  To help prevent nausea and vomiting after your treatment, we encourage you to take your nausea medication as instructed by your physician.   If you develop nausea and vomiting that is not controlled by your nausea medication, call the clinic.   BELOW ARE SYMPTOMS THAT SHOULD BE REPORTED IMMEDIATELY:  *FEVER GREATER THAN 100.5 F  *CHILLS WITH OR WITHOUT FEVER  NAUSEA AND VOMITING THAT IS NOT CONTROLLED WITH YOUR NAUSEA MEDICATION  *UNUSUAL SHORTNESS OF BREATH  *UNUSUAL BRUISING OR BLEEDING  TENDERNESS IN MOUTH AND THROAT WITH OR WITHOUT PRESENCE OF ULCERS  *URINARY PROBLEMS  *BOWEL PROBLEMS  UNUSUAL RASH Items with * indicate a potential emergency and should be followed up as soon as possible.  Feel free to call the clinic you have any questions or concerns. The clinic phone number is [phone].

## 2012-11-25 NOTE — Telephone Encounter (Signed)
appts made and printed...td 

## 2012-11-25 NOTE — Progress Notes (Signed)
ID: Diane [name]   DOB: [birth year]  MR#: [id number]  CSN#:[id number]  PCP: Diane Ser, MD GYN:  SU: Diane [name] OTHER MD: Diane [name], Diane [name], Diane [name]   HISTORY OF PRESENT ILLNESS: Diane had routine screening mammography at [facility] hospital 08/26/2012 showing a possible distortion in the right breast. Additional views of the breast Center 08/29/2012 confirmed a spiculated mass in the right breast measuring approximately 9 mm. This was not palpable. Ultrasound confirmed an irregular mass in the area in question, again measuring 9 mm. The right axilla was benign.  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 The patient's subsequent history is as detailed below.  INTERVAL HISTORY: Diane returns today accompanied by her daughter for followup of her right breast carcinoma. She's currently day 1 cycle 2 of 4 planned q. three-week doses of docetaxel/cyclophosphamide being given in the adjuvant setting.   She receives Neulasta on day 2 for granulocyte support.  Diane is feeling  well today, and feels like she finally recovered from cycle 1. Her biggest problem was diarrhea which continued for 7-10 days after treatment. A C. difficile toxin was negative on 11/12/2012. She was prescribed Questran which actually stop the diarrhea rather quickly, within a day. She is now eating and drinking well, and keeping herself well hydrated.  Diane does mention that she had one episode of "vaginal spotting" yesterday. This was "pink tinged" and was present on the toilet paper after urinating. She denies any dysuria or obvious hematuria. She does feel like her urinary frequency has increased slightly. Recall that she has a history of vulvar carcinoma,  followed by Dr. Alycia [name].   REVIEW OF SYSTEMS: Diane  has had no fevers or chills. She denies any rashes, abnormal bruising, or  abnormal bleeding. (She continues on chronic anti-coagulation for an irregular heartbeat.) She's had no nausea or emesis, and her bowels are more regular.  She denies any cough, pleurisy, chest pain, palpitation. She denies any abnormal headaches or dizziness. She currently denies any signs of numbness or tingling in the upper or lower extremities. She denies any unusual myalgias, arthralgias, or bony pain, other than her usual back pain, and she has had no peripheral swelling.  A detailed review of systems is otherwise stable and noncontributory.    PAST MEDICAL HISTORY: Past Medical History  Diagnosis Date  . Lower back pain   . Herniated disc   . Arthritis   . Lumbar stenosis     L4-5  . Spondylolysis   . Coronary artery disease   . Hypertension   . Hyperlipemia   . 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 Breast cancer   . Depression   . Dysrhythmia     afib  . Shortness of breath     exertion  . Anxiety   . Diabetes mellitus without complication     diet  . Headache(784.0)   . On antineoplastic chemotherapy Start 11/04/12    Docetaxel/Cytoxan Q 3 Weeks x 4 cycles    PAST SURGICAL HISTORY: Past Surgical History  Procedure Laterality Date  . Wide local excision  01/2010    Bilateral groin excisions, re-excision of vulva  . Posterior lumbar arthrodesis, pedicle screw fixation, posterolateral arthodesis  03/17/11  . Tonsillectomy    . Abdominal hysterectomy    . Dilation and curettage of uterus    . Tubal ligation    .  Colonoscopy    . Shoulder arthroscopy with rotator cuff repair and subacromial decompression  06/04/2012    Procedure: SHOULDER ARTHROSCOPY WITH ROTATOR CUFF REPAIR AND SUBACROMIAL DECOMPRESSION;  Surgeon: Diane Sells, MD;  Location: [place];  Service: Orthopedics;  Laterality: Left;  LEFT SHOULDER ARTHROSCOPY WITH ROTATOR CUFF REPAIR AND SUBACROMIAL DECOMPRESSION AND DISTAL CLAVICLE RESECTION  . Partial mastectomy with  needle localization and axillary sentinel lymph node bx Right 10/08/2012    Procedure: RIGHT PARTIAL MASTECTOMY WITH NEEDLE LOCALIZATION AND RIGHT AXILLARY SENTINEL LYMPH NODE BX;  Surgeon: Adin Hector, MD;  Location: [place];  Service: General;  Laterality: Right;  Injection of 1% Methylene Blue dye into right breast  . Portacath placement Left 10/08/2012    Procedure: INSERTION PORT-A-CATH WITH ULTRASOUND GUIDANCE;  Surgeon: Adin Hector, MD;  Location: [place];  Service: General;  Laterality: Left;  Using 64F [place]  . Vulvectomy partial      FAMILY HISTORY Family History  Problem Relation Age of Onset  . Stroke Mother   . Alzheimer's disease Mother   . Heart attack Father   . Heart attack Brother    the patient's father died from a heart attack at the age of 49. The patient's mother died at age 93 in the setting of Alzheimer's disease. Diane had 4 brothers, 3 sisters. There is no history of breast or ovarian cancer in the family to her knowledge.  GYNECOLOGIC HISTORY: Menarche age 77, first live birth age 42, she is [place] P3. She had a total abdominal hysterectomy with bilateral salpingo-oophorectomy in 1988. She took estrogen for approximately 30 years, stopping in January 2013.  SOCIAL HISTORY: Diane [name] used to work for [employer]. When she retired she moved to [facility], which is for her husband was from. He died approximately 11 years ago. The patient currently lives alone. Her daughter Diane [name] "hovers over many". Diane [name] works at [employer] on [address]. The other 2 children are daughter Diane [name], who lives in [place] and works in a hospital, and Diane [name], who lives in [place] and is a housewife. The patient has 5 grandchildren. She is a Furniture conservator/restorer.  ADVANCED DIRECTIVES: Not in place  HEALTH MAINTENANCE: History  Substance Use Topics  . Smoking status: Former Smoker -- 50 years    Types:  Cigarettes    Quit date: 05/29/2009  . Smokeless tobacco: Never Used  . Alcohol Use: No     Colonoscopy:  PAP:  Bone density: June 2011 at [facility] hospital; normal  Lipid panel:  Allergies  Allergen Reactions  . Shellfish Allergy Swelling    Current Outpatient Prescriptions  Medication Sig Dispense Refill  . cholestyramine (QUESTRAN) 4 G packet Take 1 packet by mouth 2 (two) times daily with a meal. For diarrhea  60 each  12  . dabigatran (PRADAXA) 75 MG CAPS Take 75 mg by mouth every 12 (twelve) hours.      [name] Kitchen dexamethasone (DECADRON) 4 MG tablet 2 tabs by mouth twice daily on day before and 3 days after each chemo dose  30 tablet  1  . ferrous sulfate 325 (65 FE) MG tablet Take 325 mg by mouth daily with breakfast.      . FLUoxetine (PROZAC) 10 MG capsule Take 10 mg by mouth daily.      [name] Kitchen HYDROcodone-acetaminophen (NORCO/VICODIN) 5-325 MG per tablet Take 1-2 tablets by mouth every 4 (four) hours as needed for pain.  40 tablet  1  . lidocaine-prilocaine (EMLA) cream Apply to port 1-2 hours before each scheduled procedure  30 g  1  . LORazepam (ATIVAN) 0.5 MG tablet Take 1 tablet (0.5 mg total) by mouth at bedtime as needed for anxiety (Nausea or vomiting).  30 tablet  0  . nebivolol (BYSTOLIC) 5 MG tablet Take 5 mg by mouth daily.      [name] Kitchen NITROSTAT 0.4 MG SL tablet       . nystatin (MYCOSTATIN) 100000 UNIT/ML suspension Take 5 mLs (500,000 Units total) by mouth 4 (four) times daily. (swish and swallow)  240 mL  0  . ondansetron (ZOFRAN) 8 MG tablet 1 tab by mouth twice daily x 3 days after chemo, then every 12 hrs as needed for nause  30 tablet  1  . pravastatin (PRAVACHOL) 40 MG tablet Take 40 mg by mouth every evening.      . prochlorperazine (COMPAZINE) 10 MG tablet 1 tab by mouth with meals and bedtime x 2 days after each chemo, then every 6 hrs as needed for nausea  30 tablet  2  . tobramycin-dexamethasone (TOBRADEX) ophthalmic solution Place 1 drop into both eyes 2 (two) times  daily. X 7 days after each chemo dose  5 mL  1  . traZODone (DESYREL) 50 MG tablet Take 50 mg by mouth at bedtime.      . triamterene-hydrochlorothiazide (MAXZIDE-25) 37.5-25 MG per tablet Take 1 tablet by mouth daily.       No current facility-administered medications for this visit.   Facility-Administered Medications Ordered in Other Visits  Medication Dose Route Frequency Provider Last Rate Last Dose  . cyclophosphamide (CYTOXAN) 1,300 mg in sodium chloride 0.9 % 250 mL chemo infusion  600 mg/m2 (Treatment Plan Actual) Intravenous Once Amy G Berry, PA-C      . DOCEtaxel (TAXOTERE) 160 mg in sodium chloride 0.9 % 250 mL chemo infusion  75 mg/m2 (Treatment Plan Actual) Intravenous Once Theotis Burrow, PA-C 266 mL/hr at 11/25/12 1412 160 mg at 11/25/12 1412  . heparin lock flush 100 unit/mL  500 Units Intracatheter Once PRN Amy G Berry, PA-C      . sodium chloride 0.9 % injection 10 mL  10 mL Intracatheter PRN Amy Milda Smart, PA-C        OBJECTIVE: Middle-aged African American woman in no acute distress Filed Vitals:   11/25/12 1143  BP: 121/74  Pulse: 75  Temp: 98.2 F (36.8 C)  Resp: 20     Body mass index is 32.93 kg/(m^2).    ECOG FS: 1 Filed Weights   11/25/12 1143  Weight: 213 lb 8 oz (96.843 kg)    Sclerae unicteric Oropharynx clear, no thrush, no ulcerations noted No cervical or supraclavicular adenopathy Lungs clear to auscultation, no rales or rhonchi Heart regular rate and rhythm Abdomen soft, obese, nontender to palpation, positive bowel sounds MSK no focal spinal tenderness, no peripheral edema Neuro: nonfocal, well oriented,  tired affect Breasts: Deferred. Axillae are benign bilaterally, no palpable adenopathy Port is intact in the left upper chest wall, with well-healed incision, no erythema, edema, or evidence of infection.   LAB RESULTS: Lab Results  Component Value Date   WBC 5.6 11/25/2012   NEUTROABS 2.8 11/25/2012   HGB 10.9* 11/25/2012   HCT 33.3*  11/25/2012   MCV 81.4 11/25/2012   PLT 323 11/25/2012      Chemistry      Component Value Date/Time   NA 138 11/25/2012 1104  NA 139 10/03/2012 1441   K 3.3* 11/25/2012 1104   K 3.2* 10/03/2012 1441   CL 105 11/04/2012 0937   CL 101 10/03/2012 1441   CO2 27 11/25/2012 1104   CO2 27 10/03/2012 1441   BUN 16.8 11/25/2012 1104   BUN 19 10/03/2012 1441   CREATININE 1.2* 11/25/2012 1104   CREATININE 1.36* 10/03/2012 1441      Component Value Date/Time   CALCIUM 9.0 11/25/2012 1104   CALCIUM 9.2 10/03/2012 1441   ALKPHOS 67 11/25/2012 1104   ALKPHOS 65 10/03/2012 1441   AST 14 11/25/2012 1104   AST 22 10/03/2012 1441   ALT 14 11/25/2012 1104   ALT 9 10/03/2012 1441   BILITOT 0.30 11/25/2012 1104   BILITOT 0.2* 10/03/2012 1441       STUDIES:  No results found.    ASSESSMENT: 73 y.o. [place] woman   (1) s/p Rigth breast biopsy 08/30/2012 for a clinical T1b No, stage IA invasive ductal carcinoma, grade 3, triple negative, with an MIB-1 of 70%.  (2)  S/p right lumpectomy under the care of Dr. Dalbert Batman on 10/08/2012 for a pT1a, pN0, grade 3 invasive ductal carcinoma, with largest residual invasive focus measuring only 0.1 cm in greatest dimension. There was no lymphovascular invasion, with 0 of one lymph node involved. Margins were clear.  (3)  being treated in the adjuvant setting, the goal being to complete 4 q. three-week doses of docetaxel/Cytoxan, first dose on 11/04/2012. Patient will receive Neulasta on day 2 of each cycle. Chemotherapy will be followed by radiation therapy.  PLAN: Diane will proceed to treatment today as scheduled for day 1 cycle 2 of docetaxel/cyclophosphamide. She will receive Neulasta tomorrow, and we'll see Dr. Jana Hakim next week on July 7 for assessment of chemotoxicity.  When she returns next week on July 7, we will try to get her in briefly for a pelvic exam with Joylene John, nurse practitioner to assess the vaginal spotting. I did obtain a urinalysis today which was  unremarkable, and we are awaiting the results of a urine culture, but there does not appear to be enough evidence of a UTI to justify the blood noted on the toilet tissue.   Diane will continue to utilize her antinausea medications as directed, and will also continue with the Questran as needed for diarrhea. If the diarrhea continues for more than 24 hours without showing improvement, I have asked her to contact our office. She voices her understanding of this plan. She will call with any changes, questions, or problems.   BERRY,AMY    11/25/2012

## 2012-11-25 NOTE — Telephone Encounter (Signed)
Per staff phone call and POF I have schedueld appts.  JMW  

## 2012-11-26 ENCOUNTER — Ambulatory Visit (HOSPITAL_BASED_OUTPATIENT_CLINIC_OR_DEPARTMENT_OTHER): Payer: Medicare Other

## 2012-11-26 VITALS — BP 132/52 | HR 58 | Temp 97.4°F

## 2012-11-26 DIAGNOSIS — C50111 Malignant neoplasm of central portion of right female breast: Secondary | ICD-10-CM

## 2012-11-26 DIAGNOSIS — R319 Hematuria, unspecified: Secondary | ICD-10-CM | POA: Diagnosis not present

## 2012-11-26 DIAGNOSIS — Z5111 Encounter for antineoplastic chemotherapy: Secondary | ICD-10-CM | POA: Diagnosis not present

## 2012-11-26 DIAGNOSIS — R197 Diarrhea, unspecified: Secondary | ICD-10-CM | POA: Diagnosis not present

## 2012-11-26 DIAGNOSIS — C50119 Malignant neoplasm of central portion of unspecified female breast: Secondary | ICD-10-CM | POA: Diagnosis not present

## 2012-11-26 DIAGNOSIS — Z8544 Personal history of malignant neoplasm of other female genital organs: Secondary | ICD-10-CM | POA: Diagnosis not present

## 2012-11-26 DIAGNOSIS — E785 Hyperlipidemia, unspecified: Secondary | ICD-10-CM | POA: Diagnosis not present

## 2012-11-26 DIAGNOSIS — R350 Frequency of micturition: Secondary | ICD-10-CM | POA: Diagnosis not present

## 2012-11-26 DIAGNOSIS — I10 Essential (primary) hypertension: Secondary | ICD-10-CM | POA: Diagnosis not present

## 2012-11-26 DIAGNOSIS — C50919 Malignant neoplasm of unspecified site of unspecified female breast: Secondary | ICD-10-CM | POA: Diagnosis not present

## 2012-11-26 DIAGNOSIS — N390 Urinary tract infection, site not specified: Secondary | ICD-10-CM | POA: Diagnosis not present

## 2012-11-26 LAB — URINE CULTURE

## 2012-11-26 MED ORDER — PEGFILGRASTIM INJECTION 6 MG/0.6ML
6.0000 mg | Freq: Once | SUBCUTANEOUS | Status: AC
  Administered 2012-11-26: 6 mg via SUBCUTANEOUS
  Filled 2012-11-26: qty 0.6

## 2012-11-27 ENCOUNTER — Encounter: Payer: Self-pay | Admitting: Oncology

## 2012-12-02 ENCOUNTER — Other Ambulatory Visit (HOSPITAL_BASED_OUTPATIENT_CLINIC_OR_DEPARTMENT_OTHER): Payer: Medicare Other | Admitting: Lab

## 2012-12-02 ENCOUNTER — Ambulatory Visit (HOSPITAL_BASED_OUTPATIENT_CLINIC_OR_DEPARTMENT_OTHER): Payer: Medicare Other | Admitting: Oncology

## 2012-12-02 VITALS — BP 105/66 | HR 71 | Temp 98.2°F | Resp 20 | Ht 67.5 in | Wt 209.3 lb

## 2012-12-02 DIAGNOSIS — C50919 Malignant neoplasm of unspecified site of unspecified female breast: Secondary | ICD-10-CM | POA: Diagnosis not present

## 2012-12-02 DIAGNOSIS — Z5189 Encounter for other specified aftercare: Secondary | ICD-10-CM | POA: Diagnosis not present

## 2012-12-02 DIAGNOSIS — C50119 Malignant neoplasm of central portion of unspecified female breast: Secondary | ICD-10-CM

## 2012-12-02 DIAGNOSIS — I10 Essential (primary) hypertension: Secondary | ICD-10-CM | POA: Diagnosis not present

## 2012-12-02 DIAGNOSIS — Z171 Estrogen receptor negative status [ER-]: Secondary | ICD-10-CM | POA: Diagnosis not present

## 2012-12-02 DIAGNOSIS — E785 Hyperlipidemia, unspecified: Secondary | ICD-10-CM | POA: Diagnosis not present

## 2012-12-02 DIAGNOSIS — C50911 Malignant neoplasm of unspecified site of right female breast: Secondary | ICD-10-CM

## 2012-12-02 DIAGNOSIS — C50419 Malignant neoplasm of upper-outer quadrant of unspecified female breast: Secondary | ICD-10-CM | POA: Diagnosis not present

## 2012-12-02 DIAGNOSIS — R197 Diarrhea, unspecified: Secondary | ICD-10-CM | POA: Diagnosis not present

## 2012-12-02 DIAGNOSIS — C519 Malignant neoplasm of vulva, unspecified: Secondary | ICD-10-CM | POA: Diagnosis not present

## 2012-12-02 DIAGNOSIS — R319 Hematuria, unspecified: Secondary | ICD-10-CM | POA: Diagnosis not present

## 2012-12-02 DIAGNOSIS — B370 Candidal stomatitis: Secondary | ICD-10-CM | POA: Diagnosis not present

## 2012-12-02 DIAGNOSIS — Z8544 Personal history of malignant neoplasm of other female genital organs: Secondary | ICD-10-CM | POA: Diagnosis not present

## 2012-12-02 DIAGNOSIS — Z5111 Encounter for antineoplastic chemotherapy: Secondary | ICD-10-CM | POA: Diagnosis not present

## 2012-12-02 DIAGNOSIS — N390 Urinary tract infection, site not specified: Secondary | ICD-10-CM | POA: Diagnosis not present

## 2012-12-02 DIAGNOSIS — R350 Frequency of micturition: Secondary | ICD-10-CM | POA: Diagnosis not present

## 2012-12-02 LAB — CBC WITH DIFFERENTIAL/PLATELET
BASO%: 0.7 % (ref 0.0–2.0)
Basophils Absolute: 0 10*3/uL (ref 0.0–0.1)
EOS%: 2.9 % (ref 0.0–7.0)
Eosinophils Absolute: 0.1 10*3/uL (ref 0.0–0.5)
HCT: 34.3 % — ABNORMAL LOW (ref 34.8–46.6)
HGB: 11.3 g/dL — ABNORMAL LOW (ref 11.6–15.9)
LYMPH%: 27.9 % (ref 14.0–49.7)
MCH: 27.3 pg (ref 25.1–34.0)
MCHC: 32.9 g/dL (ref 31.5–36.0)
MCV: 82.9 fL (ref 79.5–101.0)
MONO#: 0.7 10*3/uL (ref 0.1–0.9)
MONO%: 17.7 % — ABNORMAL HIGH (ref 0.0–14.0)
NEUT#: 2.1 10*3/uL (ref 1.5–6.5)
NEUT%: 50.8 % (ref 38.4–76.8)
Platelets: 126 10*3/uL — ABNORMAL LOW (ref 145–400)
RBC: 4.14 10*6/uL (ref 3.70–5.45)
RDW: 14.9 % — ABNORMAL HIGH (ref 11.2–14.5)
WBC: 4.1 10*3/uL (ref 3.9–10.3)
lymph#: 1.2 10*3/uL (ref 0.9–3.3)

## 2012-12-02 LAB — COMPREHENSIVE METABOLIC PANEL (CC13)
ALT: 10 U/L (ref 0–55)
AST: 12 U/L (ref 5–34)
Albumin: 2.8 g/dL — ABNORMAL LOW (ref 3.5–5.0)
Alkaline Phosphatase: 61 U/L (ref 40–150)
BUN: 20.1 mg/dL (ref 7.0–26.0)
CO2: 29 mEq/L (ref 22–29)
Calcium: 8.6 mg/dL (ref 8.4–10.4)
Chloride: 97 mEq/L — ABNORMAL LOW (ref 98–109)
Creatinine: 1.5 mg/dL — ABNORMAL HIGH (ref 0.6–1.1)
Glucose: 133 mg/dl (ref 70–140)
Potassium: 3.6 mEq/L (ref 3.5–5.1)
Sodium: 135 mEq/L — ABNORMAL LOW (ref 136–145)
Total Bilirubin: 0.51 mg/dL (ref 0.20–1.20)
Total Protein: 6.2 g/dL — ABNORMAL LOW (ref 6.4–8.3)

## 2012-12-02 NOTE — Progress Notes (Signed)
ID: Diane [name] [name]   DOB: Oct 08, 1940  MR#: [id number]  CSN#:[id number]  PCP: Myrtis Ser, MD GYN:  SU: [name] OTHER MD: [name], Nancy Marus, [name] Mardee Postin   HISTORY OF PRESENT ILLNESS: Diane [name] had routine screening mammography at [facility] hospital 08/26/2012 showing a possible distortion in the right breast. Additional views of the breast Center 08/29/2012 confirmed a spiculated mass in the right breast measuring approximately 9 mm. This was not palpable. Ultrasound confirmed an irregular mass in the area in question, again measuring 9 mm. The right axilla was benign.  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 The patient's subsequent history is as detailed below.  INTERVAL HISTORY: Diane [name] returns today accompanied by her daughter for followup of her right breast carcinoma. She's currently day 8 cycle 2 of 4 planned q. three-week doses of docetaxel/cyclophosphamide being given in the adjuvant setting.   She receives Neulasta on day 2 for granulocyte support.   REVIEW OF SYSTEMS: Diane [name]  tolerated cycle 2 without unusual complications. On day 5, July 4, she developed some nausea, which persists. She has been taking Questran for this. She has reverted case, and no appetite. She hasn't actually vomited. She is having insomnia problems getting up to urinate and doing small bowel movements as well, but no diarrhea. She has a chronic shortness of breath and palpitations/irregular heartbeat issues, as well as stress incontinence. She has pains in her joints and difficulty walking. She tells me her sugars are well-controlled even while taking the dexamethasone. She has had no further episodes of hematuria. A detailed review of systems today was otherwise noncontributory    PAST MEDICAL HISTORY: Past Medical History  Diagnosis Date  . Lower back pain   . Herniated  disc   . Arthritis   . Lumbar stenosis     L4-5  . Spondylolysis   . Coronary artery disease   . Hypertension   . Hyperlipemia   . 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 Breast cancer   . Depression   . Dysrhythmia     afib  . Shortness of breath     exertion  . Anxiety   . Diabetes mellitus without complication     diet  . Headache(784.0)   . On antineoplastic chemotherapy Start 11/04/12    Docetaxel/Cytoxan Q 3 Weeks x 4 cycles    PAST SURGICAL HISTORY: Past Surgical History  Procedure Laterality Date  . Wide local excision  01/2010    Bilateral groin excisions, re-excision of vulva  . Posterior lumbar arthrodesis, pedicle screw fixation, posterolateral arthodesis  03/17/11  . Tonsillectomy    . Abdominal hysterectomy    . Dilation and curettage of uterus    . Tubal ligation    . Colonoscopy    . Shoulder arthroscopy with rotator cuff repair and subacromial decompression  06/04/2012    Procedure: SHOULDER ARTHROSCOPY WITH ROTATOR CUFF REPAIR AND SUBACROMIAL DECOMPRESSION;  Surgeon: Nita Sells, MD;  Location: [facility];  Service: Orthopedics;  Laterality: Left;  LEFT SHOULDER ARTHROSCOPY WITH ROTATOR CUFF REPAIR AND SUBACROMIAL DECOMPRESSION AND DISTAL CLAVICLE RESECTION  . Partial mastectomy with needle localization and axillary sentinel lymph node bx Right 10/08/2012    Procedure: RIGHT PARTIAL MASTECTOMY WITH NEEDLE LOCALIZATION AND RIGHT AXILLARY SENTINEL LYMPH NODE BX;  Surgeon: Adin Hector, MD;  Location: [facility];  Service: General;  Laterality: Right;  Injection of 1% Methylene Blue dye into right breast  . Portacath placement Left 10/08/2012    Procedure: INSERTION PORT-A-CATH WITH ULTRASOUND GUIDANCE;  Surgeon: Adin Hector, MD;  Location: [facility];  Service: General;  Laterality: Left;  Using 7F [place]  . Vulvectomy partial      FAMILY HISTORY Family History  Problem Relation Age of Onset  . Stroke Mother   .  Alzheimer's disease Mother   . Heart attack Father   . Heart attack Brother    the patient's father died from a heart attack at the age of 44. The patient's mother died at age 29 in the setting of Alzheimer's disease. Diane [name] had 4 brothers, 3 sisters. There is no history of breast or ovarian cancer in the family to her knowledge.  GYNECOLOGIC HISTORY: Menarche age 10, first live birth age 66, she is [place] P3. She had a total abdominal hysterectomy with bilateral salpingo-oophorectomy in 1988. She took estrogen for approximately 30 years, stopping in January 2013.  SOCIAL HISTORY: [name] used to work for [employer]. When she retired she moved to [facility], which is for her husband was from. He died approximately 11 years ago. The patient currently lives alone. Her daughter Diane [name] [name] "hovers over many". [name] works at [employer] on [address]. The other 2 children are daughter Diane [name] [name], who lives in [place] and works in a hospital, and Diane [name] [name], who lives in [place] and is a housewife. The patient has 5 grandchildren. She is a Furniture conservator/restorer.  ADVANCED DIRECTIVES: Not in place  HEALTH MAINTENANCE: History  Substance Use Topics  . Smoking status: Former Smoker -- 50 years    Types: Cigarettes    Quit date: 05/29/2009  . Smokeless tobacco: Never Used  . Alcohol Use: No     Colonoscopy:  PAP:  Bone density: June 2011 at [facility] hospital; normal  Lipid panel:  Allergies  Allergen Reactions  . Shellfish Allergy Swelling    Current Outpatient Prescriptions  Medication Sig Dispense Refill  . cholestyramine (QUESTRAN) 4 G packet Take 1 packet by mouth 2 (two) times daily with a meal. For diarrhea  60 each  12  . dabigatran (PRADAXA) 75 MG CAPS Take 75 mg by mouth every 12 (twelve) hours.      [name] Kitchen dexamethasone (DECADRON) 4 MG tablet 2 tabs by mouth twice daily on day before and 3 days after each chemo dose  30 tablet  1   . ferrous sulfate 325 (65 FE) MG tablet Take 325 mg by mouth daily with breakfast.      . FLUoxetine (PROZAC) 10 MG capsule Take 10 mg by mouth daily.      [name] Kitchen HYDROcodone-acetaminophen (NORCO/VICODIN) 5-325 MG per tablet Take 1-2 tablets by mouth every 4 (four) hours as needed for pain.  40 tablet  1  . lidocaine-prilocaine (EMLA) cream Apply to port 1-2 hours before each scheduled procedure  30 g  1  . LORazepam (ATIVAN) 0.5 MG tablet Take 1 tablet (0.5 mg total) by mouth at bedtime as needed for anxiety (Nausea or vomiting).  30 tablet  0  . nebivolol (BYSTOLIC) 5 MG tablet Take 5 mg by mouth daily.      [name] Kitchen NITROSTAT 0.4 MG SL tablet       . nystatin (MYCOSTATIN) 100000 UNIT/ML suspension Take 5 mLs (500,000 Units total) by mouth 4 (four) times daily. (swish and swallow)  240 mL  0  . ondansetron (  ZOFRAN) 8 MG tablet 1 tab by mouth twice daily x 3 days after chemo, then every 12 hrs as needed for nause  30 tablet  1  . pravastatin (PRAVACHOL) 40 MG tablet Take 40 mg by mouth every evening.      . prochlorperazine (COMPAZINE) 10 MG tablet 1 tab by mouth with meals and bedtime x 2 days after each chemo, then every 6 hrs as needed for nausea  30 tablet  2  . tobramycin-dexamethasone (TOBRADEX) ophthalmic solution Place 1 drop into both eyes 2 (two) times daily. X 7 days after each chemo dose  5 mL  1  . traZODone (DESYREL) 50 MG tablet Take 50 mg by mouth at bedtime.      . triamterene-hydrochlorothiazide (MAXZIDE-25) 37.5-25 MG per tablet Take 1 tablet by mouth daily.       No current facility-administered medications for this visit.    OBJECTIVE: Middle-aged [place] American woman in no acute distress Filed Vitals:   12/02/12 1124  BP: 105/66  Pulse: 71  Temp: 98.2 F (36.8 C)  Resp: 20     Body mass index is 32.28 kg/(m^2).    ECOG FS: 1 Filed Weights   12/02/12 1124  Weight: 209 lb 4.8 oz (94.938 kg)    Sclerae unicteric Oropharynx clear, No cervical or supraclavicular  adenopathy Lungs clear to auscultation, no rales or rhonchi Heart regular rate and rhythm Abdomen soft, obese, nontender to palpation, positive bowel sounds MSK no focal spinal tenderness, no peripheral edema Neuro: nonfocal, well oriented,  appropriate affect Breasts: The right breast is status post lumpectomy. There is no evidence of local recurrence. The right axilla is benign. The left breast is unremarkable Port is intact in the left upper chest wall   LAB RESULTS: Lab Results  Component Value Date   WBC 4.1 12/02/2012   NEUTROABS 2.1 12/02/2012   HGB 11.3* 12/02/2012   HCT 34.3* 12/02/2012   MCV 82.9 12/02/2012   PLT 126* 12/02/2012      Chemistry      Component Value Date/Time   NA 138 11/25/2012 1104   NA 139 10/03/2012 1441   K 3.3* 11/25/2012 1104   K 3.2* 10/03/2012 1441   CL 105 11/04/2012 0937   CL 101 10/03/2012 1441   CO2 27 11/25/2012 1104   CO2 27 10/03/2012 1441   BUN 16.8 11/25/2012 1104   BUN 19 10/03/2012 1441   CREATININE 1.2* 11/25/2012 1104   CREATININE 1.36* 10/03/2012 1441      Component Value Date/Time   CALCIUM 9.0 11/25/2012 1104   CALCIUM 9.2 10/03/2012 1441   ALKPHOS 67 11/25/2012 1104   ALKPHOS 65 10/03/2012 1441   AST 14 11/25/2012 1104   AST 22 10/03/2012 1441   ALT 14 11/25/2012 1104   ALT 9 10/03/2012 1441   BILITOT 0.30 11/25/2012 1104   BILITOT 0.2* 10/03/2012 1441       STUDIES:  No results found.  ASSESSMENT: 72 y.o. [place] woman   (1) s/p Rigth breast biopsy 08/30/2012 for a clinical T1b No, stage IA invasive ductal carcinoma, grade 3, triple negative, with an MIB-1 of 70%.  (2)  S/p right lumpectomy under the care of Dr. Dalbert Batman on 10/08/2012 for a pT1a, pN0, grade 3 invasive ductal carcinoma, with largest residual invasive focus measuring only 0.1 cm in greatest dimension. There was no lymphovascular invasion, with 0 of one lymph node involved. Margins were clear.  (3)  being treated in the adjuvant setting, the goal being  to complete 4 q. three-week  doses of docetaxel/Cytoxan, first dose on 11/04/2012. Patient will receive Neulasta on day 2 of each cycle. Chemotherapy will be followed by radiation therapy.  PLAN: Diane [name] is doing generally well with her chemotherapy. She was taking Questran for nausea, which is not quite right, so I asked her to start taking Compazine 10 mg before meals, and if that Works drive 5 mg before meals. Hopefully that will take care of the nausea problem but if it does not we will switch to metoclopramide with Ativan.  She was surprised to learn that she only has 2 more treatments to go, although that has been the plan all along. She will finish her chemotherapy mid-August, so we will make her an appointment with Dr. Isidore Moos at around that time. She has had no further hematuria, if it was hematuria. She really has an appointment with Dr. Alycia Rossetti for September, so her pelvic exam can be updated at that time as appropriate.  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 Diane [name] knows to call for any problems that may develop before the next visit.   MAGRINAT,GUSTAV C    12/02/2012

## 2012-12-16 ENCOUNTER — Other Ambulatory Visit (HOSPITAL_BASED_OUTPATIENT_CLINIC_OR_DEPARTMENT_OTHER): Payer: Medicare Other | Admitting: Lab

## 2012-12-16 ENCOUNTER — Ambulatory Visit (HOSPITAL_BASED_OUTPATIENT_CLINIC_OR_DEPARTMENT_OTHER): Payer: Medicare Other

## 2012-12-16 ENCOUNTER — Ambulatory Visit (HOSPITAL_BASED_OUTPATIENT_CLINIC_OR_DEPARTMENT_OTHER): Payer: Medicare Other | Admitting: Oncology

## 2012-12-16 VITALS — BP 121/51 | HR 20 | Temp 98.3°F | Resp 20 | Ht 67.5 in | Wt 209.2 lb

## 2012-12-16 DIAGNOSIS — Z5111 Encounter for antineoplastic chemotherapy: Secondary | ICD-10-CM | POA: Diagnosis not present

## 2012-12-16 DIAGNOSIS — C50119 Malignant neoplasm of central portion of unspecified female breast: Secondary | ICD-10-CM

## 2012-12-16 DIAGNOSIS — N390 Urinary tract infection, site not specified: Secondary | ICD-10-CM | POA: Diagnosis not present

## 2012-12-16 DIAGNOSIS — R319 Hematuria, unspecified: Secondary | ICD-10-CM | POA: Diagnosis not present

## 2012-12-16 DIAGNOSIS — Z171 Estrogen receptor negative status [ER-]: Secondary | ICD-10-CM

## 2012-12-16 DIAGNOSIS — B370 Candidal stomatitis: Secondary | ICD-10-CM | POA: Diagnosis not present

## 2012-12-16 DIAGNOSIS — C50111 Malignant neoplasm of central portion of right female breast: Secondary | ICD-10-CM

## 2012-12-16 DIAGNOSIS — R197 Diarrhea, unspecified: Secondary | ICD-10-CM | POA: Diagnosis not present

## 2012-12-16 DIAGNOSIS — C50911 Malignant neoplasm of unspecified site of right female breast: Secondary | ICD-10-CM

## 2012-12-16 DIAGNOSIS — R350 Frequency of micturition: Secondary | ICD-10-CM | POA: Diagnosis not present

## 2012-12-16 DIAGNOSIS — C519 Malignant neoplasm of vulva, unspecified: Secondary | ICD-10-CM | POA: Diagnosis not present

## 2012-12-16 DIAGNOSIS — C50919 Malignant neoplasm of unspecified site of unspecified female breast: Secondary | ICD-10-CM | POA: Diagnosis not present

## 2012-12-16 DIAGNOSIS — Z5189 Encounter for other specified aftercare: Secondary | ICD-10-CM | POA: Diagnosis not present

## 2012-12-16 DIAGNOSIS — Z8544 Personal history of malignant neoplasm of other female genital organs: Secondary | ICD-10-CM | POA: Diagnosis not present

## 2012-12-16 DIAGNOSIS — C50419 Malignant neoplasm of upper-outer quadrant of unspecified female breast: Secondary | ICD-10-CM | POA: Diagnosis not present

## 2012-12-16 DIAGNOSIS — E785 Hyperlipidemia, unspecified: Secondary | ICD-10-CM | POA: Diagnosis not present

## 2012-12-16 DIAGNOSIS — I10 Essential (primary) hypertension: Secondary | ICD-10-CM | POA: Diagnosis not present

## 2012-12-16 LAB — CBC WITH DIFFERENTIAL/PLATELET
BASO%: 1 % (ref 0.0–2.0)
Basophils Absolute: 0.1 10*3/uL (ref 0.0–0.1)
EOS%: 3.9 % (ref 0.0–7.0)
Eosinophils Absolute: 0.2 10*3/uL (ref 0.0–0.5)
HCT: 30.8 % — ABNORMAL LOW (ref 34.8–46.6)
HGB: 10.2 g/dL — ABNORMAL LOW (ref 11.6–15.9)
LYMPH%: 32 % (ref 14.0–49.7)
MCH: 27.1 pg (ref 25.1–34.0)
MCHC: 33.1 g/dL (ref 31.5–36.0)
MCV: 81.7 fL (ref 79.5–101.0)
MONO#: 0.8 10*3/uL (ref 0.1–0.9)
MONO%: 12.7 % (ref 0.0–14.0)
NEUT#: 3.1 10*3/uL (ref 1.5–6.5)
NEUT%: 50.4 % (ref 38.4–76.8)
Platelets: 299 10*3/uL (ref 145–400)
RBC: 3.77 10*6/uL (ref 3.70–5.45)
RDW: 16.1 % — ABNORMAL HIGH (ref 11.2–14.5)
WBC: 6.2 10*3/uL (ref 3.9–10.3)
lymph#: 2 10*3/uL (ref 0.9–3.3)

## 2012-12-16 MED ORDER — HEPARIN SOD (PORK) LOCK FLUSH 100 UNIT/ML IV SOLN
500.0000 [IU] | Freq: Once | INTRAVENOUS | Status: AC | PRN
  Administered 2012-12-16: 500 [IU]
  Filled 2012-12-16: qty 5

## 2012-12-16 MED ORDER — SODIUM CHLORIDE 0.9 % IJ SOLN
10.0000 mL | INTRAMUSCULAR | Status: DC | PRN
  Administered 2012-12-16: 10 mL
  Filled 2012-12-16: qty 10

## 2012-12-16 MED ORDER — DEXAMETHASONE SODIUM PHOSPHATE 20 MG/5ML IJ SOLN
20.0000 mg | Freq: Once | INTRAMUSCULAR | Status: AC
  Administered 2012-12-16: 20 mg via INTRAVENOUS

## 2012-12-16 MED ORDER — SODIUM CHLORIDE 0.9 % IV SOLN
75.0000 mg/m2 | Freq: Once | INTRAVENOUS | Status: AC
  Administered 2012-12-16: 160 mg via INTRAVENOUS
  Filled 2012-12-16: qty 16

## 2012-12-16 MED ORDER — SODIUM CHLORIDE 0.9 % IV SOLN
Freq: Once | INTRAVENOUS | Status: AC
  Administered 2012-12-16: 11:00:00 via INTRAVENOUS

## 2012-12-16 MED ORDER — ONDANSETRON 16 MG/50ML IVPB (CHCC)
16.0000 mg | Freq: Once | INTRAVENOUS | Status: AC
  Administered 2012-12-16: 16 mg via INTRAVENOUS

## 2012-12-16 MED ORDER — SODIUM CHLORIDE 0.9 % IV SOLN
600.0000 mg/m2 | Freq: Once | INTRAVENOUS | Status: AC
  Administered 2012-12-16: 1300 mg via INTRAVENOUS
  Filled 2012-12-16: qty 65

## 2012-12-16 NOTE — Progress Notes (Signed)
ID: Diane D [name]   DOB: [birth year]  MR#: [id number]  CSN#:[id number]  PCP: Myrtis Ser, MD GYN:  SU: [name] OTHER MD: [name], Nancy Marus, [name] Mardee Postin   HISTORY OF PRESENT ILLNESS: Diane had routine screening mammography at [facility] hospital 08/26/2012 showing a possible distortion in the right breast. Additional views of the breast Center 08/29/2012 confirmed a spiculated mass in the right breast measuring approximately 9 mm. This was not palpable. Ultrasound confirmed an irregular mass in the area in question, again measuring 9 mm. The right axilla was benign.  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 The patient's subsequent history is as detailed below.  INTERVAL HISTORY: Diane returns today for followup of her right breast carcinoma. She's currently day 1 cycle 3 of 4 planned q. three-week doses of docetaxel/ cyclophosphamide being given in the adjuvant setting.   She receives Neulasta on day 2 for granulocyte support.   REVIEW OF SYSTEMS: Diane is doing well with her treatments. She has nausea, but no vomiting. There are mild seasonal sinus symptoms, unrelated to her treatment. She is not excercising regularly though she does have a gym membership. She has taste perversion and her appetite is decreased. She can get SOB with walking upstairs but gets to the top w/o stopping. She has mild joint pain problems. No bleeding. A detailed ROS was otherwise noncontributory.   PAST MEDICAL HISTORY: Past Medical History  Diagnosis Date  . Lower back pain   . Herniated disc   . Arthritis   . Lumbar stenosis     L4-5  . Spondylolysis   . Coronary artery disease   . Hypertension   . Hyperlipemia   . 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 Breast cancer   . Depression   . Dysrhythmia     afib  . Shortness of breath     exertion  .  Anxiety   . Diabetes mellitus without complication     diet  . Headache(784.0)   . On antineoplastic chemotherapy Start 11/04/12    Docetaxel/Cytoxan Q 3 Weeks x 4 cycles    PAST SURGICAL HISTORY: Past Surgical History  Procedure Laterality Date  . Wide local excision  01/2010    Bilateral groin excisions, re-excision of vulva  . Posterior lumbar arthrodesis, pedicle screw fixation, posterolateral arthodesis  03/17/11  . Tonsillectomy    . Abdominal hysterectomy    . Dilation and curettage of uterus    . Tubal ligation    . Colonoscopy    . Shoulder arthroscopy with rotator cuff repair and subacromial decompression  06/04/2012    Procedure: SHOULDER ARTHROSCOPY WITH ROTATOR CUFF REPAIR AND SUBACROMIAL DECOMPRESSION;  Surgeon: Nita Sells, MD;  Location: [place];  Service: Orthopedics;  Laterality: Left;  LEFT SHOULDER ARTHROSCOPY WITH ROTATOR CUFF REPAIR AND SUBACROMIAL DECOMPRESSION AND DISTAL CLAVICLE RESECTION  . Partial mastectomy with needle localization and axillary sentinel lymph node bx Right 10/08/2012    Procedure: RIGHT PARTIAL MASTECTOMY WITH NEEDLE LOCALIZATION AND RIGHT AXILLARY SENTINEL LYMPH NODE BX;  Surgeon: Adin Hector, MD;  Location: [place];  Service: General;  Laterality: Right;  Injection of 1% Methylene Blue dye into right breast  . Portacath placement Left 10/08/2012    Procedure: INSERTION PORT-A-CATH WITH ULTRASOUND GUIDANCE;  Surgeon: Adin Hector, MD;  Location: [place];  Service: General;  Laterality: Left;  Using 79F Powerport [place]  . Vulvectomy partial      FAMILY HISTORY Family History  Problem Relation Age of Onset  . Stroke Mother   . Alzheimer's disease Mother   . Heart attack Father   . Heart attack Brother    the patient's father died from a heart attack at the age of 55. The patient's mother died at age 37 in the setting of Alzheimer's disease. Diane had 4 brothers, 3 sisters. There is no history of breast or  ovarian cancer in the family to her knowledge.  GYNECOLOGIC HISTORY: Menarche age 62, first live birth age 77, she is [place] P3. She had a total abdominal hysterectomy with bilateral salpingo-oophorectomy in 1988. She took estrogen for approximately 30 years, stopping in January 2013.  SOCIAL HISTORY: [name] used to work for [employer]. When she retired she moved to [facility], which is for her husband was from. He died approximately 11 years ago. The patient currently lives alone. Her daughter [name] "hovers over me". [name] works at [employer] on [employer]. The other 2 children are daughter [name], who lives in [place] and works in a hospital, and [name], who lives in [place] and is a housewife. The patient has 5 grandchildren. She is a Furniture conservator/restorer.  ADVANCED DIRECTIVES: Not in place  HEALTH MAINTENANCE: History  Substance Use Topics  . Smoking status: Former Smoker -- 50 years    Types: Cigarettes    Quit date: 05/29/2009  . Smokeless tobacco: Never Used  . Alcohol Use: No     Colonoscopy:  PAP:  Bone density: June 2011 at [facility] hospital; normal  Lipid panel:  Allergies  Allergen Reactions  . Shellfish Allergy Swelling    Current Outpatient Prescriptions  Medication Sig Dispense Refill  . cholestyramine (QUESTRAN) 4 G packet Take 1 packet by mouth 2 (two) times daily with a meal. For diarrhea  60 each  12  . dabigatran (PRADAXA) 75 MG CAPS Take 75 mg by mouth every 12 (twelve) hours.      [name] Kitchen dexamethasone (DECADRON) 4 MG tablet 2 tabs by mouth twice daily on day before and 3 days after each chemo dose  30 tablet  1  . ferrous sulfate 325 (65 FE) MG tablet Take 325 mg by mouth daily with breakfast.      . FLUoxetine (PROZAC) 10 MG capsule Take 10 mg by mouth daily.      [name] Kitchen HYDROcodone-acetaminophen (NORCO/VICODIN) 5-325 MG per tablet Take 1-2 tablets by mouth every 4 (four) hours as needed for pain.   40 tablet  1  . lidocaine-prilocaine (EMLA) cream Apply to port 1-2 hours before each scheduled procedure  30 g  1  . LORazepam (ATIVAN) 0.5 MG tablet Take 1 tablet (0.5 mg total) by mouth at bedtime as needed for anxiety (Nausea or vomiting).  30 tablet  0  . nebivolol (BYSTOLIC) 5 MG tablet Take 5 mg by mouth daily.      [name] Kitchen NITROSTAT 0.4 MG SL tablet       . nystatin (MYCOSTATIN) 100000 UNIT/ML suspension Take 5 mLs (500,000 Units total) by mouth 4 (four) times daily. (swish and swallow)  240 mL  0  . ondansetron (ZOFRAN) 8 MG tablet 1 tab by mouth twice daily x 3 days after chemo, then every 12 hrs as needed for nause  30 tablet  1  . pravastatin (PRAVACHOL) 40 MG tablet Take 40 mg by mouth every  evening.      . prochlorperazine (COMPAZINE) 10 MG tablet 1 tab by mouth with meals and bedtime x 2 days after each chemo, then every 6 hrs as needed for nausea  30 tablet  2  . tobramycin-dexamethasone (TOBRADEX) ophthalmic solution Place 1 drop into both eyes 2 (two) times daily. X 7 days after each chemo dose  5 mL  1  . traZODone (DESYREL) 50 MG tablet Take 50 mg by mouth at bedtime.      . triamterene-hydrochlorothiazide (MAXZIDE-25) 37.5-25 MG per tablet Take 1 tablet by mouth daily.       No current facility-administered medications for this visit.    OBJECTIVE: Middle-aged [place] American woman in no acute distress Filed Vitals:   12/16/12 1017  BP: 121/51  Pulse: 20  Temp: 98.3 F (36.8 C)  Resp: 20     Body mass index is 32.26 kg/(m^2).    ECOG FS: 1 Filed Weights   12/16/12 1017  Weight: 209 lb 3.2 oz (94.892 kg)    Sclerae unicteric Oropharynx clear, No cervical or supraclavicular adenopathy Lungs clear to auscultation, no rales or rhonchi Heart regular rate and rhythm Abdomen soft, obese, nontender to palpation, positive bowel sounds MSK no focal spinal tenderness, no peripheral edema Neuro: nonfocal, well oriented,  appropriate affect Breasts: defrred Port is intact in  the left upper chest wall   LAB RESULTS: Lab Results  Component Value Date   WBC 4.1 12/02/2012   NEUTROABS 2.1 12/02/2012   HGB 11.3* 12/02/2012   HCT 34.3* 12/02/2012   MCV 82.9 12/02/2012   PLT 126* 12/02/2012      Chemistry      Component Value Date/Time   NA 135* 12/02/2012 1112   NA 139 10/03/2012 1441   K 3.6 12/02/2012 1112   K 3.2* 10/03/2012 1441   CL 105 11/04/2012 0937   CL 101 10/03/2012 1441   CO2 29 12/02/2012 1112   CO2 27 10/03/2012 1441   BUN 20.1 12/02/2012 1112   BUN 19 10/03/2012 1441   CREATININE 1.5* 12/02/2012 1112   CREATININE 1.36* 10/03/2012 1441      Component Value Date/Time   CALCIUM 8.6 12/02/2012 1112   CALCIUM 9.2 10/03/2012 1441   ALKPHOS 61 12/02/2012 1112   ALKPHOS 65 10/03/2012 1441   AST 12 12/02/2012 1112   AST 22 10/03/2012 1441   ALT 10 12/02/2012 1112   ALT 9 10/03/2012 1441   BILITOT 0.51 12/02/2012 1112   BILITOT 0.2* 10/03/2012 1441       STUDIES:  No results found.  ASSESSMENT: 72 y.o. [place] woman   (1) s/p [facility] central breast biopsy 08/30/2012 for a clinical T1b No, stage IA invasive ductal carcinoma, grade 3, triple negative, with an MIB-1 of 70%. The largest extent of tumor on the initial biopsy was 0.4 cm  (2)  S/p right lumpectomy under the care of Dr. Dalbert Batman on 10/08/2012 for a pT1a, pN0, grade 3 invasive ductal carcinoma, with largest residual invasive focus measuring 0.1 cm in greatest dimension. There was no lymphovascular invasion, with 0 of one lymph node involved. Margins were clear.  (3)  being treated in the adjuvant setting, the goal being to complete 4 q. three-week doses of docetaxel/Cytoxan, first dose on 11/04/2012. Patient will receive Neulasta on day 2 of each cycle. Chemotherapy will be followed by radiation therapy.  PLAN: Diane will proceed to her third cycle of chemotherapy today. She wil return in one week for follow-up then complete her  treatments in 3 weeks, after which she will have her port pulled and will be ready for radiation.  She knows to call for any problems that may develop before the next visit   MAGRINAT,GUSTAV C    12/16/2012

## 2012-12-16 NOTE — Patient Instructions (Signed)
[facility] Discharge Instructions for Patients Receiving Chemotherapy  Today you received the following chemotherapy agents Taxotere/Cytoxan  To help prevent nausea and vomiting after your treatment, we encourage you to take your nausea medication as needed   If you develop nausea and vomiting that is not controlled by your nausea medication, call the clinic.   BELOW ARE SYMPTOMS THAT SHOULD BE REPORTED IMMEDIATELY:  *FEVER GREATER THAN 100.5 F  *CHILLS WITH OR WITHOUT FEVER  NAUSEA AND VOMITING THAT IS NOT CONTROLLED WITH YOUR NAUSEA MEDICATION  *UNUSUAL SHORTNESS OF BREATH  *UNUSUAL BRUISING OR BLEEDING  TENDERNESS IN MOUTH AND THROAT WITH OR WITHOUT PRESENCE OF ULCERS  *URINARY PROBLEMS  *BOWEL PROBLEMS  UNUSUAL RASH Items with * indicate a potential emergency and should be followed up as soon as possible.  Feel free to call the clinic you have any questions or concerns. The clinic phone number is [phone].

## 2012-12-17 ENCOUNTER — Ambulatory Visit (HOSPITAL_BASED_OUTPATIENT_CLINIC_OR_DEPARTMENT_OTHER): Payer: Medicare Other

## 2012-12-17 VITALS — BP 131/89 | HR 73 | Temp 98.2°F

## 2012-12-17 DIAGNOSIS — R197 Diarrhea, unspecified: Secondary | ICD-10-CM | POA: Diagnosis not present

## 2012-12-17 DIAGNOSIS — C50419 Malignant neoplasm of upper-outer quadrant of unspecified female breast: Secondary | ICD-10-CM | POA: Diagnosis not present

## 2012-12-17 DIAGNOSIS — Z5111 Encounter for antineoplastic chemotherapy: Secondary | ICD-10-CM | POA: Diagnosis not present

## 2012-12-17 DIAGNOSIS — C50111 Malignant neoplasm of central portion of right female breast: Secondary | ICD-10-CM

## 2012-12-17 DIAGNOSIS — Z8544 Personal history of malignant neoplasm of other female genital organs: Secondary | ICD-10-CM | POA: Diagnosis not present

## 2012-12-17 DIAGNOSIS — C50919 Malignant neoplasm of unspecified site of unspecified female breast: Secondary | ICD-10-CM | POA: Diagnosis not present

## 2012-12-17 DIAGNOSIS — Z171 Estrogen receptor negative status [ER-]: Secondary | ICD-10-CM | POA: Diagnosis not present

## 2012-12-17 DIAGNOSIS — N390 Urinary tract infection, site not specified: Secondary | ICD-10-CM | POA: Diagnosis not present

## 2012-12-17 DIAGNOSIS — R319 Hematuria, unspecified: Secondary | ICD-10-CM | POA: Diagnosis not present

## 2012-12-17 DIAGNOSIS — Z5189 Encounter for other specified aftercare: Secondary | ICD-10-CM | POA: Diagnosis not present

## 2012-12-17 DIAGNOSIS — R350 Frequency of micturition: Secondary | ICD-10-CM | POA: Diagnosis not present

## 2012-12-17 DIAGNOSIS — C50119 Malignant neoplasm of central portion of unspecified female breast: Secondary | ICD-10-CM

## 2012-12-17 DIAGNOSIS — E785 Hyperlipidemia, unspecified: Secondary | ICD-10-CM | POA: Diagnosis not present

## 2012-12-17 DIAGNOSIS — I10 Essential (primary) hypertension: Secondary | ICD-10-CM | POA: Diagnosis not present

## 2012-12-17 DIAGNOSIS — C519 Malignant neoplasm of vulva, unspecified: Secondary | ICD-10-CM | POA: Diagnosis not present

## 2012-12-17 DIAGNOSIS — B370 Candidal stomatitis: Secondary | ICD-10-CM | POA: Diagnosis not present

## 2012-12-17 MED ORDER — PEGFILGRASTIM INJECTION 6 MG/0.6ML
6.0000 mg | Freq: Once | SUBCUTANEOUS | Status: AC
  Administered 2012-12-17: 6 mg via SUBCUTANEOUS
  Filled 2012-12-17: qty 0.6

## 2012-12-19 DIAGNOSIS — E119 Type 2 diabetes mellitus without complications: Secondary | ICD-10-CM | POA: Diagnosis not present

## 2012-12-19 DIAGNOSIS — I10 Essential (primary) hypertension: Secondary | ICD-10-CM | POA: Diagnosis not present

## 2012-12-23 ENCOUNTER — Telehealth: Payer: Self-pay | Admitting: Oncology

## 2012-12-23 ENCOUNTER — Other Ambulatory Visit: Payer: Medicare Other

## 2012-12-23 ENCOUNTER — Telehealth: Payer: Self-pay | Admitting: *Deleted

## 2012-12-23 ENCOUNTER — Ambulatory Visit: Payer: Medicare Other | Admitting: Physician Assistant

## 2012-12-23 NOTE — Telephone Encounter (Signed)
This RN called pt per communication earlier today that pt cancelled appointment due to " sick ".  Diane [name] states she has " been sick since Friday with nausea, no appetite, light headed and chills ". She denies fever or cough.  Per phone conversation she states she rescheduled to Friday- this RN discussed concern relating to symptoms are side effects of treatment and need to see her for assessment and management ASAP. This RN offered appt for tomorrow with Diane [name] stating " well I will have to see if I feel up to it because I drive myself ".  This RN reiterated medical concern to see her due to symptoms may worsen if not treated properly.  Diane [name] will call this RN in am if she feels she cannot come in for visit.

## 2012-12-23 NOTE — Telephone Encounter (Signed)
Pt called and r/s to 7/31 lab and ML , Val aware

## 2012-12-24 ENCOUNTER — Ambulatory Visit (HOSPITAL_BASED_OUTPATIENT_CLINIC_OR_DEPARTMENT_OTHER): Payer: Medicare Other

## 2012-12-24 ENCOUNTER — Encounter: Payer: Self-pay | Admitting: Physician Assistant

## 2012-12-24 ENCOUNTER — Telehealth: Payer: Self-pay | Admitting: Oncology

## 2012-12-24 ENCOUNTER — Ambulatory Visit (HOSPITAL_BASED_OUTPATIENT_CLINIC_OR_DEPARTMENT_OTHER): Payer: Medicare Other | Admitting: Lab

## 2012-12-24 ENCOUNTER — Other Ambulatory Visit (HOSPITAL_BASED_OUTPATIENT_CLINIC_OR_DEPARTMENT_OTHER): Payer: Medicare Other | Admitting: Lab

## 2012-12-24 ENCOUNTER — Ambulatory Visit (HOSPITAL_BASED_OUTPATIENT_CLINIC_OR_DEPARTMENT_OTHER): Payer: Medicare Other | Admitting: Physician Assistant

## 2012-12-24 VITALS — BP 94/64 | HR 55 | Temp 98.0°F | Resp 18 | Ht 67.0 in | Wt 203.0 lb

## 2012-12-24 VITALS — BP 113/47 | HR 56

## 2012-12-24 DIAGNOSIS — Z8544 Personal history of malignant neoplasm of other female genital organs: Secondary | ICD-10-CM | POA: Diagnosis not present

## 2012-12-24 DIAGNOSIS — R110 Nausea: Secondary | ICD-10-CM

## 2012-12-24 DIAGNOSIS — C50919 Malignant neoplasm of unspecified site of unspecified female breast: Secondary | ICD-10-CM | POA: Diagnosis not present

## 2012-12-24 DIAGNOSIS — R0602 Shortness of breath: Secondary | ICD-10-CM

## 2012-12-24 DIAGNOSIS — C519 Malignant neoplasm of vulva, unspecified: Secondary | ICD-10-CM | POA: Diagnosis not present

## 2012-12-24 DIAGNOSIS — C50119 Malignant neoplasm of central portion of unspecified female breast: Secondary | ICD-10-CM

## 2012-12-24 DIAGNOSIS — C50911 Malignant neoplasm of unspecified site of right female breast: Secondary | ICD-10-CM

## 2012-12-24 DIAGNOSIS — R197 Diarrhea, unspecified: Secondary | ICD-10-CM | POA: Diagnosis not present

## 2012-12-24 DIAGNOSIS — C50419 Malignant neoplasm of upper-outer quadrant of unspecified female breast: Secondary | ICD-10-CM | POA: Diagnosis not present

## 2012-12-24 DIAGNOSIS — R5381 Other malaise: Secondary | ICD-10-CM

## 2012-12-24 DIAGNOSIS — C50111 Malignant neoplasm of central portion of right female breast: Secondary | ICD-10-CM

## 2012-12-24 DIAGNOSIS — R350 Frequency of micturition: Secondary | ICD-10-CM

## 2012-12-24 DIAGNOSIS — I10 Essential (primary) hypertension: Secondary | ICD-10-CM | POA: Diagnosis not present

## 2012-12-24 DIAGNOSIS — E860 Dehydration: Secondary | ICD-10-CM

## 2012-12-24 DIAGNOSIS — R319 Hematuria, unspecified: Secondary | ICD-10-CM | POA: Diagnosis not present

## 2012-12-24 DIAGNOSIS — D649 Anemia, unspecified: Secondary | ICD-10-CM

## 2012-12-24 DIAGNOSIS — R5383 Other fatigue: Secondary | ICD-10-CM

## 2012-12-24 DIAGNOSIS — Z171 Estrogen receptor negative status [ER-]: Secondary | ICD-10-CM

## 2012-12-24 DIAGNOSIS — B370 Candidal stomatitis: Secondary | ICD-10-CM | POA: Diagnosis not present

## 2012-12-24 DIAGNOSIS — Z5111 Encounter for antineoplastic chemotherapy: Secondary | ICD-10-CM | POA: Diagnosis not present

## 2012-12-24 DIAGNOSIS — Z5189 Encounter for other specified aftercare: Secondary | ICD-10-CM | POA: Diagnosis not present

## 2012-12-24 DIAGNOSIS — E876 Hypokalemia: Secondary | ICD-10-CM

## 2012-12-24 DIAGNOSIS — E785 Hyperlipidemia, unspecified: Secondary | ICD-10-CM | POA: Diagnosis not present

## 2012-12-24 DIAGNOSIS — N390 Urinary tract infection, site not specified: Secondary | ICD-10-CM | POA: Diagnosis not present

## 2012-12-24 LAB — COMPREHENSIVE METABOLIC PANEL (CC13)
ALT: 11 U/L (ref 0–55)
AST: 20 U/L (ref 5–34)
Albumin: 2.7 g/dL — ABNORMAL LOW (ref 3.5–5.0)
Alkaline Phosphatase: 63 U/L (ref 40–150)
BUN: 16.2 mg/dL (ref 7.0–26.0)
CO2: 26 mEq/L (ref 22–29)
Calcium: 8.8 mg/dL (ref 8.4–10.4)
Chloride: 99 mEq/L (ref 98–109)
Creatinine: 1.4 mg/dL — ABNORMAL HIGH (ref 0.6–1.1)
Glucose: 128 mg/dl (ref 70–140)
Potassium: 3.1 mEq/L — ABNORMAL LOW (ref 3.5–5.1)
Sodium: 137 mEq/L (ref 136–145)
Total Bilirubin: 0.53 mg/dL (ref 0.20–1.20)
Total Protein: 6.4 g/dL (ref 6.4–8.3)

## 2012-12-24 LAB — CBC WITH DIFFERENTIAL/PLATELET
BASO%: 0.6 % (ref 0.0–2.0)
Basophils Absolute: 0.1 10*3/uL (ref 0.0–0.1)
EOS%: 0.8 % (ref 0.0–7.0)
Eosinophils Absolute: 0.1 10*3/uL (ref 0.0–0.5)
HCT: 31.2 % — ABNORMAL LOW (ref 34.8–46.6)
HGB: 10.4 g/dL — ABNORMAL LOW (ref 11.6–15.9)
LYMPH%: 9.9 % — ABNORMAL LOW (ref 14.0–49.7)
MCH: 26.9 pg (ref 25.1–34.0)
MCHC: 33.3 g/dL (ref 31.5–36.0)
MCV: 80.8 fL (ref 79.5–101.0)
MONO#: 2 10*3/uL — ABNORMAL HIGH (ref 0.1–0.9)
MONO%: 17.2 % — ABNORMAL HIGH (ref 0.0–14.0)
NEUT#: 8.5 10*3/uL — ABNORMAL HIGH (ref 1.5–6.5)
NEUT%: 71.5 % (ref 38.4–76.8)
Platelets: 169 10*3/uL (ref 145–400)
RBC: 3.86 10*6/uL (ref 3.70–5.45)
RDW: 15.6 % — ABNORMAL HIGH (ref 11.2–14.5)
WBC: 11.9 10*3/uL — ABNORMAL HIGH (ref 3.9–10.3)
lymph#: 1.2 10*3/uL (ref 0.9–3.3)
nRBC: 2 % — ABNORMAL HIGH (ref 0–0)

## 2012-12-24 LAB — URINALYSIS, MICROSCOPIC - CHCC
Bilirubin (Urine): NEGATIVE
Blood: NEGATIVE
Glucose: NEGATIVE mg/dL
Ketones: NEGATIVE mg/dL
Nitrite: NEGATIVE
Protein: <30 mg/dL
RBC / HPF: NEGATIVE (ref 0–2)
Specific Gravity, Urine: 1.02 (ref 1.003–1.035)
Urobilinogen, UR: 0.2 mg/dL (ref 0.2–1)
pH: 6 (ref 4.6–8.0)

## 2012-12-24 MED ORDER — HEPARIN SOD (PORK) LOCK FLUSH 100 UNIT/ML IV SOLN
500.0000 [IU] | Freq: Once | INTRAVENOUS | Status: AC
  Administered 2012-12-24: 500 [IU] via INTRAVENOUS
  Filled 2012-12-24: qty 5

## 2012-12-24 MED ORDER — PROCHLORPERAZINE EDISYLATE 5 MG/ML IJ SOLN
5.0000 mg | Freq: Once | INTRAMUSCULAR | Status: AC
  Administered 2012-12-24: 5 mg via INTRAVENOUS

## 2012-12-24 MED ORDER — POTASSIUM CHLORIDE CRYS ER 20 MEQ PO TBCR: 20.0000 meq | EXTENDED_RELEASE_TABLET | Freq: Every day | ORAL | Status: DC

## 2012-12-24 MED ORDER — SODIUM CHLORIDE 0.9 % IV SOLN
Freq: Once | INTRAVENOUS | Status: AC
  Administered 2012-12-24: 12:00:00 via INTRAVENOUS

## 2012-12-24 MED ORDER — SODIUM CHLORIDE 0.9 % IJ SOLN
10.0000 mL | Freq: Once | INTRAMUSCULAR | Status: AC
  Administered 2012-12-24: 10 mL
  Filled 2012-12-24: qty 10

## 2012-12-24 MED ORDER — DEXAMETHASONE SODIUM PHOSPHATE 10 MG/ML IJ SOLN
8.0000 mg | Freq: Once | INTRAMUSCULAR | Status: AC
  Administered 2012-12-24: 12:00:00 via INTRAVENOUS

## 2012-12-24 NOTE — Patient Instructions (Addendum)
[facility] Discharge Instructions for Patients Receiving IV fluids Today you received the following IV fluids To help prevent nausea and vomiting after your treatment, we encourage you to take your nausea medication as per Dr. Jana Hakim and Micah Flesher   If you develop nausea and vomiting that is not controlled by your nausea medication, call the clinic.   BELOW ARE SYMPTOMS THAT SHOULD BE REPORTED IMMEDIATELY:  *FEVER GREATER THAN 100.5 F  *CHILLS WITH OR WITHOUT FEVER  NAUSEA AND VOMITING THAT IS NOT CONTROLLED WITH YOUR NAUSEA MEDICATION  *UNUSUAL SHORTNESS OF BREATH  *UNUSUAL BRUISING OR BLEEDING  TENDERNESS IN MOUTH AND THROAT WITH OR WITHOUT PRESENCE OF ULCERS  *URINARY PROBLEMS  *BOWEL PROBLEMS  UNUSUAL RASH Items with * indicate a potential emergency and should be followed up as soon as possible.  Feel free to call the clinic you have any questions or concerns. The clinic phone number is [phone].

## 2012-12-24 NOTE — Progress Notes (Signed)
ID: Diane D Fullwood   DOB: 1940-08-05  MR#: [id number]  CSN#:[id number]  PCP: Myrtis Ser, MD GYN:  SU: [name] OTHER MD: [name], Nancy Marus, [name] Mardee Postin   HISTORY OF PRESENT ILLNESS: Diane had routine screening mammography at [facility] hospital 08/26/2012 showing a possible distortion in the right breast. Additional views of the breast Center 08/29/2012 confirmed a spiculated mass in the right breast measuring approximately 9 mm. This was not palpable. Ultrasound confirmed an irregular mass in the area in question, again measuring 9 mm. The right axilla was benign.  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 The patient's subsequent history is as detailed below.  INTERVAL HISTORY: Diane returns today for followup of her right breast carcinoma. Diane's currently day 9 cycle 3 of 4 planned q. three-week doses of docetaxel/ cyclophosphamide being given in the adjuvant setting.   Diane receives Neulasta on day 2 for granulocyte support.  Diane tells me Diane has been "sick" since the weekend. Diane had a routine followup visit with Dr. Felipa Eth on Friday. On the way home, "the heat.to her" and Diane began feeling nauseous and weak. Diane's had no actual emesis. Diane's been taking her antinausea medication with only minimal relief. Beginning this [name], Diane is also had some abdominal cramping and 2 episodes of diarrhea. Diane denies any [name], Diane does feel chilled. There's been no evidence of blood or mucus in the stool. Diane does note, however, increased urinary frequency.    REVIEW OF SYSTEMS: Diane is extremely weak and tired. Her appetite is reduced and Diane has taste aversion. Diane's had no cough or phlegm production Diane does have shortness of breath with exertion. Diane denies orthopnea and has had no peripheral swelling. Diane denies any chest pain or palpitations. Diane's  had no abnormal headaches or change in vision, Diane does feel dizzy if Diane stands up too quickly.  A detailed review of systems is otherwise noncontributory.  PAST MEDICAL HISTORY: Past Medical History  Diagnosis Date  . Lower back pain   . Herniated disc   . Arthritis   . Lumbar stenosis     L4-5  . Spondylolysis   . Coronary artery disease   . Hypertension   . Hyperlipemia   . 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 Breast cancer   . Depression   . Dysrhythmia     afib  . Shortness of breath     exertion  . Anxiety   . Diabetes mellitus without complication     diet  . Headache(784.0)   . On antineoplastic chemotherapy Start 11/04/12    Docetaxel/Cytoxan Q 3 Weeks x 4 cycles    PAST SURGICAL HISTORY: Past Surgical History  Procedure Laterality Date  . Wide local excision  01/2010    Bilateral groin excisions, re-excision of vulva  . Posterior lumbar arthrodesis, pedicle screw fixation, posterolateral arthodesis  03/17/11  . Tonsillectomy    . Abdominal hysterectomy    . Dilation and curettage of uterus    . Tubal ligation    . Colonoscopy    . Shoulder arthroscopy with rotator cuff repair and subacromial decompression  06/04/2012    Procedure: SHOULDER ARTHROSCOPY WITH ROTATOR CUFF REPAIR AND SUBACROMIAL DECOMPRESSION;  Surgeon: Nita Sells, MD;  Location: [place];  Service: Orthopedics;  Laterality: Left;  LEFT SHOULDER ARTHROSCOPY WITH ROTATOR CUFF REPAIR AND SUBACROMIAL DECOMPRESSION  AND DISTAL CLAVICLE RESECTION  . Partial mastectomy with needle localization and axillary sentinel lymph node bx Right 10/08/2012    Procedure: RIGHT PARTIAL MASTECTOMY WITH NEEDLE LOCALIZATION AND RIGHT AXILLARY SENTINEL LYMPH NODE BX;  Surgeon: Adin Hector, MD;  Location: [place];  Service: General;  Laterality: Right;  Injection of 1% Methylene Blue dye into right breast  . Portacath placement Left 10/08/2012    Procedure: INSERTION PORT-A-CATH  WITH ULTRASOUND GUIDANCE;  Surgeon: Adin Hector, MD;  Location: [facility];  Service: General;  Laterality: Left;  Using 25F [place]  . Vulvectomy partial      FAMILY HISTORY Family History  Problem Relation Age of Onset  . Stroke Mother   . Alzheimer's disease Mother   . Heart attack Father   . Heart attack Brother    the patient's father died from a heart attack at the age of 21. The patient's mother died at age 53 in the setting of Alzheimer's disease. Diane had 4 brothers, 3 sisters. There is no history of breast or ovarian cancer in the family to her knowledge.  GYNECOLOGIC HISTORY: Menarche age 34, first live birth age 44, Diane is [place] P3. Diane had a total abdominal hysterectomy with bilateral salpingo-oophorectomy in 1988. Diane took estrogen for approximately 30 years, stopping in January 2013.  SOCIAL HISTORY: [name] used to work for [employer]. When Diane retired Diane moved to [facility], which is for her husband was from. He died approximately 11 years ago. The patient currently lives alone. Her daughter [name] "hovers over me". [name] works at [employer] on [address]. The other 2 children are daughter [name], who lives in [place] and works in a hospital, and [name], who lives in [place] and is a housewife. The patient has 5 grandchildren. 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  ADVANCED DIRECTIVES: Not in place  HEALTH MAINTENANCE: History  Substance Use Topics  . Smoking status: Former Smoker -- 50 years    Types: Cigarettes    Quit date: 05/29/2009  . Smokeless tobacco: Never Used  . Alcohol Use: No     Colonoscopy:  PAP:  Bone density: June 2011 at [facility] hospital; normal  Lipid panel:  Allergies  Allergen Reactions  . Shellfish Allergy Swelling    Current Outpatient Prescriptions  Medication Sig Dispense Refill  . cholestyramine (QUESTRAN) 4 G packet Take 1 packet by mouth 2 (two) times  daily with a meal. For diarrhea  60 each  12  . dabigatran (PRADAXA) 75 MG CAPS Take 75 mg by mouth every 12 (twelve) hours.      [name] Kitchen dexamethasone (DECADRON) 4 MG tablet 2 tabs by mouth twice daily on day before and 3 days after each chemo dose  30 tablet  1  . ferrous sulfate 325 (65 FE) MG tablet Take 325 mg by mouth daily with breakfast.      . FLUoxetine (PROZAC) 10 MG capsule Take 10 mg by mouth daily.      [name] Kitchen HYDROcodone-acetaminophen (NORCO/VICODIN) 5-325 MG per tablet Take 1-2 tablets by mouth every 4 (four) hours as needed for pain.  40 tablet  1  . lidocaine-prilocaine (EMLA) cream Apply to port 1-2 hours before each scheduled procedure  30 g  1  . LORazepam (ATIVAN) 0.5 MG tablet Take 1 tablet (0.5 mg total) by mouth at bedtime as needed for anxiety (Nausea or vomiting).  30 tablet  0  . nebivolol (BYSTOLIC) 5 MG  tablet Take 5 mg by mouth daily.      [name] Kitchen NITROSTAT 0.4 MG SL tablet       . nystatin (MYCOSTATIN) 100000 UNIT/ML suspension Take 5 mLs (500,000 Units total) by mouth 4 (four) times daily. (swish and swallow)  240 mL  0  . ondansetron (ZOFRAN) 8 MG tablet 1 tab by mouth twice daily x 3 days after chemo, then every 12 hrs as needed for nause  30 tablet  1  . pravastatin (PRAVACHOL) 40 MG tablet Take 40 mg by mouth every evening.      . prochlorperazine (COMPAZINE) 10 MG tablet 1 tab by mouth with meals and bedtime x 2 days after each chemo, then every 6 hrs as needed for nausea  30 tablet  2  . tobramycin-dexamethasone (TOBRADEX) ophthalmic solution Place 1 drop into both eyes 2 (two) times daily. X 7 days after each chemo dose  5 mL  1  . traZODone (DESYREL) 50 MG tablet Take 50 mg by mouth at bedtime.       Current Facility-Administered Medications  Medication Dose Route Frequency Provider Last Rate Last Dose  . 0.9 %  sodium chloride infusion   Intravenous Once Amy G Berry, PA-C      . dexamethasone (DECADRON) injection 8 mg  8 mg Intravenous Once Amy G Berry, PA-C      .  prochlorperazine (COMPAZINE) injection 5 mg  5 mg Intravenous Once Amy G Berry, PA-C        OBJECTIVE: Middle-aged [place] American woman who appears weak and tired, Diane is in no acute distress Filed Vitals:   12/24/12 1050  BP: 94/64  Pulse: 55  Temp: 98 F (36.7 C)  Resp: 18     Body mass index is 31.79 kg/(m^2).    ECOG FS: 1 Filed Weights   12/24/12 1050  Weight: 203 lb (92.08 kg)   Sclerae unicteric Oropharynx clear, No cervical or supraclavicular adenopathy Lungs clear to auscultation, no wheezes, no rales or rhonchi Heart regular rate and rhythm Abdomen soft, obese, nontender to palpation, positive bowel sounds, slightly hyperactive MSK no focal spinal tenderness, no peripheral edema Neuro: nonfocal, well oriented,  appropriate affect Breasts: Deferred Port is intact in the left upper chest wall with no edema, erythema, or evidence of infection Skin turgor is fair   LAB RESULTS: Lab Results  Component Value Date   WBC 11.9* 12/24/2012   NEUTROABS 8.5* 12/24/2012   HGB 10.4* 12/24/2012   HCT 31.2* 12/24/2012   MCV 80.8 12/24/2012   PLT 169 12/24/2012      Chemistry      Component Value Date/Time   NA 137 12/24/2012 1039   NA 139 10/03/2012 1441   K 3.1* 12/24/2012 1039   K 3.2* 10/03/2012 1441   CL 105 11/04/2012 0937   CL 101 10/03/2012 1441   CO2 26 12/24/2012 1039   CO2 27 10/03/2012 1441   BUN 16.2 12/24/2012 1039   BUN 19 10/03/2012 1441   CREATININE 1.4* 12/24/2012 1039   CREATININE 1.36* 10/03/2012 1441      Component Value Date/Time   CALCIUM 8.8 12/24/2012 1039   CALCIUM 9.2 10/03/2012 1441   ALKPHOS 63 12/24/2012 1039   ALKPHOS 65 10/03/2012 1441   AST 20 12/24/2012 1039   AST 22 10/03/2012 1441   ALT 11 12/24/2012 1039   ALT 9 10/03/2012 1441   BILITOT 0.53 12/24/2012 1039   BILITOT 0.2* 10/03/2012 1441       STUDIES:  No results found.  ASSESSMENT: 72 y.o. [place] woman   (1) s/p Rigth central breast biopsy 08/30/2012 for a clinical T1b No, stage IA invasive  ductal carcinoma, grade 3, triple negative, with an MIB-1 of 70%. The largest extent of tumor on the initial biopsy was 0.4 cm  (2)  S/p right lumpectomy under the care of Dr. Dalbert Batman on 10/08/2012 for a pT1a, pN0, grade 3 invasive ductal carcinoma, with largest residual invasive focus measuring 0.1 cm in greatest dimension. There was no lymphovascular invasion, with 0 of one lymph node involved. Margins were clear.  (3)  being treated in the adjuvant setting, the goal being to complete 4 q. three-week doses of docetaxel/Cytoxan, first dose on 11/04/2012. Patient will receive Neulasta on day 2 of each cycle. Chemotherapy will be followed by radiation therapy.  PLAN: Diane will receive supportive IV fluids, along with IV and anti-emetics today to help rehydrate her, and decrease her nausea. I have encouraged her to continue using her ondansetron or prochlorperazine home for nausea, in addition to lorazepam. (Diane does know not to drive after taking that medication.)  Diane'll resume taking Questran when needed for the diarrhea, and I have also asked her to contact us tomorrow if these issues have not improved significantly.  Otherwise, I am scheduled to see Diane again in 2 weeks on August 11 in anticipation of her fourth and final dose of adjuvant chemotherapy. Diane voices her understanding and agreement with the above, nose to call with any changes or problems.  I am going to obtain a repeat urinalysis and culture today to evaluate increased urinary frequency and procee assess for urinary tract infection. Diane's also being started on potassium supplementation, 20 mEq daily.  BERRY,AMY    12/24/2012

## 2012-12-24 NOTE — Progress Notes (Signed)
Add on IVF today. Pt. Has been experiencing nausea, no vomiting and diarrhea since weekend.  Antiemetic and IVF given today in clinic.  HL

## 2012-12-24 NOTE — Telephone Encounter (Signed)
, °

## 2012-12-25 ENCOUNTER — Other Ambulatory Visit: Payer: Self-pay

## 2012-12-25 DIAGNOSIS — N390 Urinary tract infection, site not specified: Secondary | ICD-10-CM

## 2012-12-25 MED ORDER — NITROFURANTOIN MONOHYD MACRO 100 MG PO CAPS: 100.0000 mg | ORAL_CAPSULE | Freq: Two times a day (BID) | ORAL | Status: DC

## 2012-12-26 ENCOUNTER — Ambulatory Visit: Payer: Medicare Other | Admitting: Physician Assistant

## 2012-12-26 ENCOUNTER — Other Ambulatory Visit: Payer: Medicare Other | Admitting: Lab

## 2012-12-26 LAB — URINE CULTURE

## 2013-01-03 DIAGNOSIS — I10 Essential (primary) hypertension: Secondary | ICD-10-CM | POA: Diagnosis not present

## 2013-01-03 DIAGNOSIS — E7800 Pure hypercholesterolemia, unspecified: Secondary | ICD-10-CM | POA: Diagnosis not present

## 2013-01-06 ENCOUNTER — Encounter: Payer: Self-pay | Admitting: Physician Assistant

## 2013-01-06 ENCOUNTER — Telehealth: Payer: Self-pay | Admitting: *Deleted

## 2013-01-06 ENCOUNTER — Other Ambulatory Visit: Payer: Self-pay | Admitting: Physician Assistant

## 2013-01-06 ENCOUNTER — Other Ambulatory Visit (HOSPITAL_BASED_OUTPATIENT_CLINIC_OR_DEPARTMENT_OTHER): Payer: Medicare Other | Admitting: Lab

## 2013-01-06 ENCOUNTER — Ambulatory Visit (HOSPITAL_BASED_OUTPATIENT_CLINIC_OR_DEPARTMENT_OTHER): Payer: Medicare Other | Admitting: Physician Assistant

## 2013-01-06 ENCOUNTER — Ambulatory Visit (HOSPITAL_BASED_OUTPATIENT_CLINIC_OR_DEPARTMENT_OTHER): Payer: Medicare Other

## 2013-01-06 VITALS — BP 131/72 | HR 93 | Temp 98.5°F | Resp 20 | Ht 67.0 in | Wt 207.1 lb

## 2013-01-06 DIAGNOSIS — C50911 Malignant neoplasm of unspecified site of right female breast: Secondary | ICD-10-CM

## 2013-01-06 DIAGNOSIS — E785 Hyperlipidemia, unspecified: Secondary | ICD-10-CM | POA: Diagnosis not present

## 2013-01-06 DIAGNOSIS — Z5189 Encounter for other specified aftercare: Secondary | ICD-10-CM | POA: Diagnosis not present

## 2013-01-06 DIAGNOSIS — R197 Diarrhea, unspecified: Secondary | ICD-10-CM

## 2013-01-06 DIAGNOSIS — C50919 Malignant neoplasm of unspecified site of unspecified female breast: Secondary | ICD-10-CM

## 2013-01-06 DIAGNOSIS — Z171 Estrogen receptor negative status [ER-]: Secondary | ICD-10-CM

## 2013-01-06 DIAGNOSIS — B370 Candidal stomatitis: Secondary | ICD-10-CM | POA: Diagnosis not present

## 2013-01-06 DIAGNOSIS — Z8544 Personal history of malignant neoplasm of other female genital organs: Secondary | ICD-10-CM | POA: Diagnosis not present

## 2013-01-06 DIAGNOSIS — C50111 Malignant neoplasm of central portion of right female breast: Secondary | ICD-10-CM

## 2013-01-06 DIAGNOSIS — R350 Frequency of micturition: Secondary | ICD-10-CM | POA: Diagnosis not present

## 2013-01-06 DIAGNOSIS — C50119 Malignant neoplasm of central portion of unspecified female breast: Secondary | ICD-10-CM | POA: Diagnosis not present

## 2013-01-06 DIAGNOSIS — C50419 Malignant neoplasm of upper-outer quadrant of unspecified female breast: Secondary | ICD-10-CM | POA: Diagnosis not present

## 2013-01-06 DIAGNOSIS — R5381 Other malaise: Secondary | ICD-10-CM | POA: Diagnosis not present

## 2013-01-06 DIAGNOSIS — Z5111 Encounter for antineoplastic chemotherapy: Secondary | ICD-10-CM

## 2013-01-06 DIAGNOSIS — R110 Nausea: Secondary | ICD-10-CM | POA: Diagnosis not present

## 2013-01-06 DIAGNOSIS — E860 Dehydration: Secondary | ICD-10-CM

## 2013-01-06 DIAGNOSIS — C519 Malignant neoplasm of vulva, unspecified: Secondary | ICD-10-CM

## 2013-01-06 DIAGNOSIS — D649 Anemia, unspecified: Secondary | ICD-10-CM

## 2013-01-06 DIAGNOSIS — R0602 Shortness of breath: Secondary | ICD-10-CM | POA: Diagnosis not present

## 2013-01-06 DIAGNOSIS — R319 Hematuria, unspecified: Secondary | ICD-10-CM | POA: Diagnosis not present

## 2013-01-06 DIAGNOSIS — G609 Hereditary and idiopathic neuropathy, unspecified: Secondary | ICD-10-CM

## 2013-01-06 DIAGNOSIS — N390 Urinary tract infection, site not specified: Secondary | ICD-10-CM | POA: Diagnosis not present

## 2013-01-06 DIAGNOSIS — I10 Essential (primary) hypertension: Secondary | ICD-10-CM | POA: Diagnosis not present

## 2013-01-06 LAB — CBC WITH DIFFERENTIAL/PLATELET
BASO%: 1.8 % (ref 0.0–2.0)
Basophils Absolute: 0.1 10*3/uL (ref 0.0–0.1)
EOS%: 1.4 % (ref 0.0–7.0)
Eosinophils Absolute: 0.1 10*3/uL (ref 0.0–0.5)
HCT: 28.8 % — ABNORMAL LOW (ref 34.8–46.6)
HGB: 9.5 g/dL — ABNORMAL LOW (ref 11.6–15.9)
LYMPH%: 31.1 % (ref 14.0–49.7)
MCH: 27.4 pg (ref 25.1–34.0)
MCHC: 32.9 g/dL (ref 31.5–36.0)
MCV: 83.2 fL (ref 79.5–101.0)
MONO#: 1.1 10*3/uL — ABNORMAL HIGH (ref 0.1–0.9)
MONO%: 18.6 % — ABNORMAL HIGH (ref 0.0–14.0)
NEUT#: 2.8 10*3/uL (ref 1.5–6.5)
NEUT%: 47.1 % (ref 38.4–76.8)
Platelets: 263 10*3/uL (ref 145–400)
RBC: 3.46 10*6/uL — ABNORMAL LOW (ref 3.70–5.45)
RDW: 17.5 % — ABNORMAL HIGH (ref 11.2–14.5)
WBC: 6 10*3/uL (ref 3.9–10.3)
lymph#: 1.9 10*3/uL (ref 0.9–3.3)

## 2013-01-06 LAB — COMPREHENSIVE METABOLIC PANEL (CC13)
ALT: 13 U/L (ref 0–55)
AST: 25 U/L (ref 5–34)
Albumin: 2.3 g/dL — ABNORMAL LOW (ref 3.5–5.0)
Alkaline Phosphatase: 60 U/L (ref 40–150)
BUN: 6.5 mg/dL — ABNORMAL LOW (ref 7.0–26.0)
CO2: 19 mEq/L — ABNORMAL LOW (ref 22–29)
Calcium: 8.4 mg/dL (ref 8.4–10.4)
Chloride: 110 mEq/L — ABNORMAL HIGH (ref 98–109)
Creatinine: 1.1 mg/dL (ref 0.6–1.1)
Glucose: 97 mg/dl (ref 70–140)
Potassium: 4.1 mEq/L (ref 3.5–5.1)
Sodium: 139 mEq/L (ref 136–145)
Total Bilirubin: 0.37 mg/dL (ref 0.20–1.20)
Total Protein: 6.5 g/dL (ref 6.4–8.3)

## 2013-01-06 MED ORDER — SODIUM CHLORIDE 0.9 % IJ SOLN
10.0000 mL | INTRAMUSCULAR | Status: DC | PRN
  Administered 2013-01-06: 10 mL
  Filled 2013-01-06: qty 10

## 2013-01-06 MED ORDER — DEXAMETHASONE SODIUM PHOSPHATE 20 MG/5ML IJ SOLN
20.0000 mg | Freq: Once | INTRAMUSCULAR | Status: AC
  Administered 2013-01-06: 20 mg via INTRAVENOUS

## 2013-01-06 MED ORDER — DOCETAXEL CHEMO INJECTION 160 MG/16ML
75.0000 mg/m2 | Freq: Once | INTRAVENOUS | Status: AC
  Administered 2013-01-06: 160 mg via INTRAVENOUS
  Filled 2013-01-06: qty 16

## 2013-01-06 MED ORDER — HEPARIN SOD (PORK) LOCK FLUSH 100 UNIT/ML IV SOLN
500.0000 [IU] | Freq: Once | INTRAVENOUS | Status: AC | PRN
  Administered 2013-01-06: 500 [IU]
  Filled 2013-01-06: qty 5

## 2013-01-06 MED ORDER — CYCLOPHOSPHAMIDE CHEMO INJECTION 1 GM
600.0000 mg/m2 | Freq: Once | INTRAMUSCULAR | Status: AC
  Administered 2013-01-06: 1300 mg via INTRAVENOUS
  Filled 2013-01-06: qty 65

## 2013-01-06 MED ORDER — SODIUM CHLORIDE 0.9 % IV SOLN
Freq: Once | INTRAVENOUS | Status: AC
  Administered 2013-01-06: 13:00:00 via INTRAVENOUS

## 2013-01-06 MED ORDER — ONDANSETRON 16 MG/50ML IVPB (CHCC)
16.0000 mg | Freq: Once | INTRAVENOUS | Status: AC
  Administered 2013-01-06: 16 mg via INTRAVENOUS

## 2013-01-06 NOTE — Progress Notes (Signed)
ID: Diane [name] D Grizzell   DOB: 02/09/1941  MR#: [id number]  CSN#:[id number]  PCP: Myrtis Ser, MD GYN:  SU: [name] OTHER MD: [name], Nancy Marus, [name] Mardee Postin   HISTORY OF PRESENT ILLNESS: Diane [name] had routine screening mammography at [facility] hospital 08/26/2012 showing a possible distortion in the right breast. Additional views of the breast Center 08/29/2012 confirmed a spiculated mass in the right breast measuring approximately 9 mm. This was not palpable. Ultrasound confirmed an irregular mass in the area in question, again measuring 9 mm. The right axilla was benign.  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 The patient's subsequent history is as detailed below.  INTERVAL HISTORY: Diane [name] returns today for followup of her right breast carcinoma. She's due for her fourth and final planned q. three-week doses of docetaxel/ cyclophosphamide today, being given in the adjuvant setting.   She receives Neulasta on day 2 for granulocyte support.  Diane [name] biggest problem throughout chemotherapy has been diarrhea and mild dehydration. She still has frequent loose bowel movements throughout the day, and this seems to occur within an hour of eating or drinking. She has had a negative C. difficile test. She is on Questran which helps minimally, but she admits that she is only taking it "1-2 times" daily.    Diane [name] has a history of diabetes with some pre-existing peripheral neuropathy, especially affecting the lower extremities. Her feet feel "sensitive". This continues, but has not worsened. She does have some mild loss of sensation in the fingers, but this has been stable as well.    REVIEW OF SYSTEMS: Diane [name] is  very weak and tired.  She's had no fevers or chills. She denies any rashes and has had no abnormal bruising or bleeding. She denies any mouth ulcers or oral  sensitivity. Her appetite is reduced and she has taste aversion.  Fortunately, she denies any problems with nausea or emesis since her last appointment here.She's had no cough or phlegm production but does have shortness of breath with exertion. She denies orthopnea and has had no peripheral swelling. She denies any chest pain or palpitations. She's had no abnormal headaches or change in vision.  She denies any current pain, specifically no unusual myalgias, arthralgias, or bony pain.   A detailed review of systems is otherwise noncontributory.  PAST MEDICAL HISTORY: Past Medical History  Diagnosis Date  . Lower back pain   . Herniated disc   . Arthritis   . Lumbar stenosis     L4-5  . Spondylolysis   . Coronary artery disease   . Hypertension   . Hyperlipemia   . 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 Breast cancer   . Depression   . Dysrhythmia     afib  . Shortness of breath     exertion  . Anxiety   . Diabetes mellitus without complication     diet  . Headache(784.0)   . On antineoplastic chemotherapy Start 11/04/12    Docetaxel/Cytoxan Q 3 Weeks x 4 cycles    PAST SURGICAL HISTORY: Past Surgical History  Procedure Laterality Date  . Wide local excision  01/2010    Bilateral groin excisions, re-excision of vulva  . Posterior lumbar arthrodesis, pedicle screw fixation, posterolateral arthodesis  03/17/11  . Tonsillectomy    . Abdominal hysterectomy    . Dilation and curettage of uterus    . Tubal  ligation    . Colonoscopy    . Shoulder arthroscopy with rotator cuff repair and subacromial decompression  06/04/2012    Procedure: SHOULDER ARTHROSCOPY WITH ROTATOR CUFF REPAIR AND SUBACROMIAL DECOMPRESSION;  Surgeon: Nita Sells, MD;  Location: [place];  Service: Orthopedics;  Laterality: Left;  LEFT SHOULDER ARTHROSCOPY WITH ROTATOR CUFF REPAIR AND SUBACROMIAL DECOMPRESSION AND DISTAL CLAVICLE RESECTION  . Partial mastectomy with needle  localization and axillary sentinel lymph node bx Right 10/08/2012    Procedure: RIGHT PARTIAL MASTECTOMY WITH NEEDLE LOCALIZATION AND RIGHT AXILLARY SENTINEL LYMPH NODE BX;  Surgeon: Adin Hector, MD;  Location: [place];  Service: General;  Laterality: Right;  Injection of 1% Methylene Blue dye into right breast  . Portacath placement Left 10/08/2012    Procedure: INSERTION PORT-A-CATH WITH ULTRASOUND GUIDANCE;  Surgeon: Adin Hector, MD;  Location: [place];  Service: General;  Laterality: Left;  Using 41F [place]  . Vulvectomy partial      FAMILY HISTORY Family History  Problem Relation Age of Onset  . Stroke Mother   . Alzheimer's disease Mother   . Heart attack Father   . Heart attack Brother    the patient's father died from a heart attack at the age of 58. The patient's mother died at age 82 in the setting of Alzheimer's disease. Diane [name] had 4 brothers, 3 sisters. There is no history of breast or ovarian cancer in the family to her knowledge.  GYNECOLOGIC HISTORY: Menarche age 47, first live birth age 69, she is [place] P3. She had a total abdominal hysterectomy with bilateral salpingo-oophorectomy in 1988. She took estrogen for approximately 30 years, stopping in January 2013.  SOCIAL HISTORY: [name] used to work for [employer]. When she retired she moved to [facility], which is for her husband was from. He died approximately 11 years ago. The patient currently lives alone. Her daughter [name] "hovers over me". [name] works at [employer] on [address]. The other 2 children are daughter [name], who lives in [place] and works in a hospital, and [name], who lives in [place] and is a housewife. The patient has 5 grandchildren. She is a Furniture conservator/restorer.  ADVANCED DIRECTIVES: Not in place  HEALTH MAINTENANCE: History  Substance Use Topics  . Smoking status: Former Smoker -- 50 years    Types: Cigarettes     Quit date: 05/29/2009  . Smokeless tobacco: Never Used  . Alcohol Use: No     Colonoscopy:  PAP:  Bone density: June 2011 at [facility] hospital; normal  Lipid panel:  Allergies  Allergen Reactions  . Shellfish Allergy Swelling    Current Outpatient Prescriptions  Medication Sig Dispense Refill  . cholestyramine (QUESTRAN) 4 G packet Take 1 packet by mouth 2 (two) times daily with a meal. For diarrhea  60 each  12  . dabigatran (PRADAXA) 75 MG CAPS Take 75 mg by mouth every 12 (twelve) hours.      [name] Kitchen dexamethasone (DECADRON) 4 MG tablet 2 tabs by mouth twice daily on day before and 3 days after each chemo dose  30 tablet  1  . ferrous sulfate 325 (65 FE) MG tablet Take 325 mg by mouth daily with breakfast.      . FLUoxetine (PROZAC) 10 MG capsule Take 10 mg by mouth daily.      [name] Kitchen HYDROcodone-acetaminophen (NORCO/VICODIN) 5-325 MG per tablet Take 1-2 tablets by mouth every 4 (four) hours  as needed for pain.  40 tablet  1  . lidocaine-prilocaine (EMLA) cream Apply to port 1-2 hours before each scheduled procedure  30 g  1  . LORazepam (ATIVAN) 0.5 MG tablet Take 1 tablet (0.5 mg total) by mouth at bedtime as needed for anxiety (Nausea or vomiting).  30 tablet  0  . nebivolol (BYSTOLIC) 5 MG tablet Take 5 mg by mouth daily.      . nitrofurantoin, macrocrystal-monohydrate, (MACROBID) 100 MG capsule Take 1 capsule (100 mg total) by mouth 2 (two) times daily. For 10 days.  20 capsule  0  . NITROSTAT 0.4 MG SL tablet       . nystatin (MYCOSTATIN) 100000 UNIT/ML suspension Take 5 mLs (500,000 Units total) by mouth 4 (four) times daily. (swish and swallow)  240 mL  0  . ondansetron (ZOFRAN) 8 MG tablet 1 tab by mouth twice daily x 3 days after chemo, then every 12 hrs as needed for nause  30 tablet  1  . potassium chloride SA (K-DUR,KLOR-CON) 20 MEQ tablet Take 1 tablet (20 mEq total) by mouth daily.  30 tablet  3  . pravastatin (PRAVACHOL) 40 MG tablet Take 40 mg by mouth every evening.      .  prochlorperazine (COMPAZINE) 10 MG tablet 1 tab by mouth with meals and bedtime x 2 days after each chemo, then every 6 hrs as needed for nausea  30 tablet  2  . tobramycin-dexamethasone (TOBRADEX) ophthalmic solution Place 1 drop into both eyes 2 (two) times daily. X 7 days after each chemo dose  5 mL  1  . traZODone (DESYREL) 50 MG tablet Take 50 mg by mouth at bedtime.       No current facility-administered medications for this visit.    OBJECTIVE: Middle-aged [place] American woman who appears weak and tired, but is in no acute distress Filed Vitals:   01/06/13 1148  BP: 131/72  Pulse: 93  Temp: 98.5 F (36.9 C)  Resp: 20     Body mass index is 32.43 kg/(m^2).    ECOG FS: 1 Filed Weights   01/06/13 1148  Weight: 207 lb 1.6 oz (93.94 kg)   Sclerae unicteric Oropharynx clear, buccal mucosa is pink but dry No cervical or supraclavicular adenopathy Lungs clear to auscultation, no wheezes, no rales or rhonchi Heart regular rate and rhythm Abdomen soft, obese, nontender to palpation, mildly hyperactive bowel sounds.  MSK no focal spinal tenderness, no peripheral edema Neuro: nonfocal, well oriented,  appropriate affect Breasts: Deferred.  Axillae benign bilaterally, no palpable adenopathy noted Port is intact in the left upper chest wall with no edema, erythema, or evidence of infection Skin turgor is fair   LAB RESULTS: Lab Results  Component Value Date   WBC 6.0 01/06/2013   NEUTROABS 2.8 01/06/2013   HGB 9.5* 01/06/2013   HCT 28.8* 01/06/2013   MCV 83.2 01/06/2013   PLT 263 01/06/2013      Chemistry      Component Value Date/Time   NA 137 12/24/2012 1039   NA 139 10/03/2012 1441   K 3.1* 12/24/2012 1039   K 3.2* 10/03/2012 1441   CL 105 11/04/2012 0937   CL 101 10/03/2012 1441   CO2 26 12/24/2012 1039   CO2 27 10/03/2012 1441   BUN 16.2 12/24/2012 1039   BUN 19 10/03/2012 1441   CREATININE 1.4* 12/24/2012 1039   CREATININE 1.36* 10/03/2012 1441      Component Value Date/Time    CALCIUM  8.8 12/24/2012 1039   CALCIUM 9.2 10/03/2012 1441   ALKPHOS 63 12/24/2012 1039   ALKPHOS 65 10/03/2012 1441   AST 20 12/24/2012 1039   AST 22 10/03/2012 1441   ALT 11 12/24/2012 1039   ALT 9 10/03/2012 1441   BILITOT 0.53 12/24/2012 1039   BILITOT 0.2* 10/03/2012 1441       STUDIES:  No results found.    ASSESSMENT: 34 y.o. [place] woman   (1) s/p Rigth central breast biopsy 08/30/2012 for a clinical T1b No, stage IA invasive ductal carcinoma, grade 3, triple negative, with an MIB-1 of 70%. The largest extent of tumor on the initial biopsy was 0.4 cm  (2)  S/p right lumpectomy under the care of Dr. Dalbert Batman on 10/08/2012 for a pT1a, pN0, grade 3 invasive ductal carcinoma, with largest residual invasive focus measuring 0.1 cm in greatest dimension. There was no lymphovascular invasion, with 0 of one lymph node involved. Margins were clear.  (3)  being treated in the adjuvant setting, the goal being to complete 4 q. three-week doses of docetaxel/Cytoxan, first dose on 11/04/2012. Patient will receive Neulasta on day 2 of each cycle. Chemotherapy will be followed by radiation therapy.  PLAN: Diane [name] will proceed to treatment today as scheduled for her fourth and final cycle of adjuvant chemotherapy.  She'll return tomorrow for her Neulasta injection, and I will also order some supportive IV fluids to keep her hydrated. I will see her again next week on August 18 processed with chemotoxicity, and with her history of diarrhea, we will plan on supportive IV fluids that day as well.  With regards to the diarrhea, encouraged her to take Questran consistently 2 times daily. She can also use one tablet of Imodium after every 2 watery stools, a maximum of 4 tablets daily. If this does not take care of the diarrhea, she understands that she needs to contact us for further evaluation, likely for repeat labs and additional fluids.  Fortunately, this completes Layza's adjuvant chemotherapy. She'll see  Dr. Isidore Moos next week to discuss her upcoming radiation therapy, and is also scheduled to meet with both Dr. Rogelio Seen and Dr. Dalbert Batman in September. She will see Dr. Jana Hakim in early October after completion of radiation therapy to discuss long-term followup plans.  She voices her understanding and agreement with the above plan, and knows to call with any changes or problems.   BERRY,AMY    01/06/2013

## 2013-01-06 NOTE — Telephone Encounter (Signed)
Per staff phone call and POF I have schedueld appts.  JMW  

## 2013-01-06 NOTE — Telephone Encounter (Signed)
appts made and printed...td 

## 2013-01-07 ENCOUNTER — Ambulatory Visit (HOSPITAL_BASED_OUTPATIENT_CLINIC_OR_DEPARTMENT_OTHER): Payer: Medicare Other

## 2013-01-07 ENCOUNTER — Ambulatory Visit: Payer: Medicare Other

## 2013-01-07 ENCOUNTER — Other Ambulatory Visit: Payer: Self-pay | Admitting: Oncology

## 2013-01-07 VITALS — BP 117/57 | HR 65 | Temp 97.0°F

## 2013-01-07 DIAGNOSIS — I10 Essential (primary) hypertension: Secondary | ICD-10-CM | POA: Diagnosis not present

## 2013-01-07 DIAGNOSIS — Z5111 Encounter for antineoplastic chemotherapy: Secondary | ICD-10-CM | POA: Diagnosis not present

## 2013-01-07 DIAGNOSIS — Z8544 Personal history of malignant neoplasm of other female genital organs: Secondary | ICD-10-CM | POA: Diagnosis not present

## 2013-01-07 DIAGNOSIS — R0602 Shortness of breath: Secondary | ICD-10-CM | POA: Diagnosis not present

## 2013-01-07 DIAGNOSIS — B370 Candidal stomatitis: Secondary | ICD-10-CM | POA: Diagnosis not present

## 2013-01-07 DIAGNOSIS — C50419 Malignant neoplasm of upper-outer quadrant of unspecified female breast: Secondary | ICD-10-CM | POA: Diagnosis not present

## 2013-01-07 DIAGNOSIS — R110 Nausea: Secondary | ICD-10-CM | POA: Diagnosis not present

## 2013-01-07 DIAGNOSIS — Z5189 Encounter for other specified aftercare: Secondary | ICD-10-CM | POA: Diagnosis not present

## 2013-01-07 DIAGNOSIS — R197 Diarrhea, unspecified: Secondary | ICD-10-CM | POA: Diagnosis not present

## 2013-01-07 DIAGNOSIS — C50111 Malignant neoplasm of central portion of right female breast: Secondary | ICD-10-CM

## 2013-01-07 DIAGNOSIS — C50119 Malignant neoplasm of central portion of unspecified female breast: Secondary | ICD-10-CM

## 2013-01-07 DIAGNOSIS — N390 Urinary tract infection, site not specified: Secondary | ICD-10-CM | POA: Diagnosis not present

## 2013-01-07 DIAGNOSIS — C519 Malignant neoplasm of vulva, unspecified: Secondary | ICD-10-CM | POA: Diagnosis not present

## 2013-01-07 DIAGNOSIS — R319 Hematuria, unspecified: Secondary | ICD-10-CM | POA: Diagnosis not present

## 2013-01-07 DIAGNOSIS — G609 Hereditary and idiopathic neuropathy, unspecified: Secondary | ICD-10-CM | POA: Diagnosis not present

## 2013-01-07 DIAGNOSIS — C50919 Malignant neoplasm of unspecified site of unspecified female breast: Secondary | ICD-10-CM | POA: Diagnosis not present

## 2013-01-07 DIAGNOSIS — R5381 Other malaise: Secondary | ICD-10-CM | POA: Diagnosis not present

## 2013-01-07 DIAGNOSIS — Z171 Estrogen receptor negative status [ER-]: Secondary | ICD-10-CM | POA: Diagnosis not present

## 2013-01-07 DIAGNOSIS — E785 Hyperlipidemia, unspecified: Secondary | ICD-10-CM | POA: Diagnosis not present

## 2013-01-07 DIAGNOSIS — R350 Frequency of micturition: Secondary | ICD-10-CM | POA: Diagnosis not present

## 2013-01-07 MED ORDER — PEGFILGRASTIM INJECTION 6 MG/0.6ML
6.0000 mg | Freq: Once | SUBCUTANEOUS | Status: AC
  Administered 2013-01-07: 6 mg via SUBCUTANEOUS
  Filled 2013-01-07: qty 0.6

## 2013-01-07 MED ORDER — SODIUM CHLORIDE 0.9 % IV SOLN
INTRAVENOUS | Status: DC
  Administered 2013-01-07: 10:00:00 via INTRAVENOUS

## 2013-01-07 NOTE — Patient Instructions (Signed)
Dehydration, Adult Dehydration is when you lose more fluids from the body than you take in. Vital organs like the kidneys, brain, and heart cannot function without a proper amount of fluids and salt. Any loss of fluids from the body can cause dehydration.  CAUSES   Vomiting.  Diarrhea.  Excessive sweating.  Excessive urine output.  Fever. SYMPTOMS  Mild dehydration  Thirst.  Dry lips.  Slightly dry mouth. Moderate dehydration  Very dry mouth.  Sunken eyes.  Skin does not bounce back quickly when lightly pinched and released.  Dark urine and decreased urine production.  Decreased tear production.  Headache. Severe dehydration  Very dry mouth.  Extreme thirst.  Rapid, weak pulse (more than 100 beats per minute at rest).  Cold hands and feet.  Not able to sweat in spite of heat and temperature.  Rapid breathing.  Blue lips.  Confusion and lethargy.  Difficulty being awakened.  Minimal urine production.  No tears. DIAGNOSIS  Your caregiver will diagnose dehydration based on your symptoms and your exam. Blood and urine tests will help confirm the diagnosis. The diagnostic evaluation should also identify the cause of dehydration. TREATMENT  Treatment of mild or moderate dehydration can often be done at home by increasing the amount of fluids that you drink. It is best to drink small amounts of fluid more often. Drinking too much at one time can make vomiting worse. Refer to the home care instructions below. Severe dehydration needs to be treated at the hospital where you will probably be given intravenous (IV) fluids that contain water and electrolytes. HOME CARE INSTRUCTIONS   Ask your caregiver about specific rehydration instructions.  Drink enough fluids to keep your urine clear or pale yellow.  Drink small amounts frequently if you have nausea and vomiting.  Eat as you normally do.  Avoid:  Foods or drinks high in sugar.  Carbonated  drinks.  Juice.  Extremely hot or cold fluids.  Drinks with caffeine.  Fatty, greasy foods.  Alcohol.  Tobacco.  Overeating.  Gelatin desserts.  Wash your hands well to avoid spreading bacteria and viruses.  Only take over-the-counter or prescription medicines for pain, discomfort, or fever as directed by your caregiver.  Ask your caregiver if you should continue all prescribed and over-the-counter medicines.  Keep all follow-up appointments with your caregiver. SEEK MEDICAL CARE IF:  You have abdominal pain and it increases or stays in one area (localizes).  You have a rash, stiff neck, or severe headache.  You are irritable, sleepy, or difficult to awaken.  You are weak, dizzy, or extremely thirsty. SEEK IMMEDIATE MEDICAL CARE IF:   You are unable to keep fluids down or you get worse despite treatment.  You have frequent episodes of vomiting or diarrhea.  You have blood or green matter (bile) in your vomit.  You have blood in your stool or your stool looks black and tarry.  You have not urinated in 6 to 8 hours, or you have only urinated a small amount of very dark urine.  You have a fever.  You faint. MAKE SURE YOU:   Understand these instructions.  Will watch your condition.  Will get help right away if you are not doing well or get worse. Document Released: 05/15/2005 Document Revised: 08/07/2011 Document Reviewed: 01/02/2011 ExitCare Patient Information 2014 [employer].  

## 2013-01-08 ENCOUNTER — Telehealth: Payer: Self-pay | Admitting: *Deleted

## 2013-01-08 NOTE — Telephone Encounter (Signed)
sw pt gv appt for 03/05/13 w/labs @ 12:45pm and ov @1 :15pm. Pt is aware...td

## 2013-01-12 ENCOUNTER — Other Ambulatory Visit: Payer: Self-pay | Admitting: Oncology

## 2013-01-13 ENCOUNTER — Ambulatory Visit (HOSPITAL_BASED_OUTPATIENT_CLINIC_OR_DEPARTMENT_OTHER): Payer: Medicare Other

## 2013-01-13 ENCOUNTER — Other Ambulatory Visit (HOSPITAL_BASED_OUTPATIENT_CLINIC_OR_DEPARTMENT_OTHER): Payer: Medicare Other | Admitting: Lab

## 2013-01-13 ENCOUNTER — Encounter: Payer: Self-pay | Admitting: Physician Assistant

## 2013-01-13 ENCOUNTER — Telehealth: Payer: Self-pay | Admitting: *Deleted

## 2013-01-13 ENCOUNTER — Ambulatory Visit (HOSPITAL_BASED_OUTPATIENT_CLINIC_OR_DEPARTMENT_OTHER): Payer: Medicare Other | Admitting: Physician Assistant

## 2013-01-13 ENCOUNTER — Encounter: Payer: Self-pay | Admitting: Radiation Oncology

## 2013-01-13 VITALS — BP 106/67 | HR 112 | Resp 18

## 2013-01-13 VITALS — BP 123/78 | HR 109 | Temp 98.4°F | Resp 20 | Ht 67.0 in | Wt 200.9 lb

## 2013-01-13 DIAGNOSIS — E860 Dehydration: Secondary | ICD-10-CM

## 2013-01-13 DIAGNOSIS — C50919 Malignant neoplasm of unspecified site of unspecified female breast: Secondary | ICD-10-CM | POA: Diagnosis not present

## 2013-01-13 DIAGNOSIS — Z171 Estrogen receptor negative status [ER-]: Secondary | ICD-10-CM

## 2013-01-13 DIAGNOSIS — C50111 Malignant neoplasm of central portion of right female breast: Secondary | ICD-10-CM

## 2013-01-13 DIAGNOSIS — R197 Diarrhea, unspecified: Secondary | ICD-10-CM | POA: Diagnosis not present

## 2013-01-13 DIAGNOSIS — Z5189 Encounter for other specified aftercare: Secondary | ICD-10-CM | POA: Diagnosis not present

## 2013-01-13 DIAGNOSIS — R110 Nausea: Secondary | ICD-10-CM

## 2013-01-13 DIAGNOSIS — C519 Malignant neoplasm of vulva, unspecified: Secondary | ICD-10-CM

## 2013-01-13 DIAGNOSIS — R350 Frequency of micturition: Secondary | ICD-10-CM | POA: Diagnosis not present

## 2013-01-13 DIAGNOSIS — T451X5A Adverse effect of antineoplastic and immunosuppressive drugs, initial encounter: Secondary | ICD-10-CM

## 2013-01-13 DIAGNOSIS — C50119 Malignant neoplasm of central portion of unspecified female breast: Secondary | ICD-10-CM | POA: Diagnosis not present

## 2013-01-13 DIAGNOSIS — I10 Essential (primary) hypertension: Secondary | ICD-10-CM | POA: Diagnosis not present

## 2013-01-13 DIAGNOSIS — R0602 Shortness of breath: Secondary | ICD-10-CM | POA: Diagnosis not present

## 2013-01-13 DIAGNOSIS — C50911 Malignant neoplasm of unspecified site of right female breast: Secondary | ICD-10-CM

## 2013-01-13 DIAGNOSIS — C50419 Malignant neoplasm of upper-outer quadrant of unspecified female breast: Secondary | ICD-10-CM | POA: Diagnosis not present

## 2013-01-13 DIAGNOSIS — N390 Urinary tract infection, site not specified: Secondary | ICD-10-CM | POA: Diagnosis not present

## 2013-01-13 DIAGNOSIS — D649 Anemia, unspecified: Secondary | ICD-10-CM

## 2013-01-13 DIAGNOSIS — E785 Hyperlipidemia, unspecified: Secondary | ICD-10-CM | POA: Diagnosis not present

## 2013-01-13 DIAGNOSIS — Z5111 Encounter for antineoplastic chemotherapy: Secondary | ICD-10-CM | POA: Diagnosis not present

## 2013-01-13 DIAGNOSIS — Z8544 Personal history of malignant neoplasm of other female genital organs: Secondary | ICD-10-CM | POA: Diagnosis not present

## 2013-01-13 DIAGNOSIS — R5381 Other malaise: Secondary | ICD-10-CM | POA: Diagnosis not present

## 2013-01-13 DIAGNOSIS — G609 Hereditary and idiopathic neuropathy, unspecified: Secondary | ICD-10-CM | POA: Diagnosis not present

## 2013-01-13 DIAGNOSIS — R319 Hematuria, unspecified: Secondary | ICD-10-CM | POA: Diagnosis not present

## 2013-01-13 DIAGNOSIS — D701 Agranulocytosis secondary to cancer chemotherapy: Secondary | ICD-10-CM

## 2013-01-13 DIAGNOSIS — B370 Candidal stomatitis: Secondary | ICD-10-CM

## 2013-01-13 LAB — CBC WITH DIFFERENTIAL/PLATELET
BASO%: 2.6 % — ABNORMAL HIGH (ref 0.0–2.0)
Basophils Absolute: 0.1 10*3/uL (ref 0.0–0.1)
EOS%: 2.2 % (ref 0.0–7.0)
Eosinophils Absolute: 0.1 10*3/uL (ref 0.0–0.5)
HCT: 28.1 % — ABNORMAL LOW (ref 34.8–46.6)
HGB: 9.1 g/dL — ABNORMAL LOW (ref 11.6–15.9)
LYMPH%: 30.6 % (ref 14.0–49.7)
MCH: 26.4 pg (ref 25.1–34.0)
MCHC: 32.4 g/dL (ref 31.5–36.0)
MCV: 81.4 fL (ref 79.5–101.0)
MONO#: 1 10*3/uL — ABNORMAL HIGH (ref 0.1–0.9)
MONO%: 37.6 % — ABNORMAL HIGH (ref 0.0–14.0)
NEUT#: 0.7 10*3/uL — ABNORMAL LOW (ref 1.5–6.5)
NEUT%: 27 % — ABNORMAL LOW (ref 38.4–76.8)
Platelets: 150 10*3/uL (ref 145–400)
RBC: 3.45 10*6/uL — ABNORMAL LOW (ref 3.70–5.45)
RDW: 16.8 % — ABNORMAL HIGH (ref 11.2–14.5)
WBC: 2.7 10*3/uL — ABNORMAL LOW (ref 3.9–10.3)
lymph#: 0.8 10*3/uL — ABNORMAL LOW (ref 0.9–3.3)
nRBC: 8 % — ABNORMAL HIGH (ref 0–0)

## 2013-01-13 MED ORDER — FLUCONAZOLE 100 MG PO TABS: ORAL_TABLET | ORAL | Status: DC

## 2013-01-13 MED ORDER — CIPROFLOXACIN HCL 500 MG PO TABS: 500.0000 mg | ORAL_TABLET | Freq: Two times a day (BID) | ORAL | Status: DC

## 2013-01-13 MED ORDER — HEPARIN SOD (PORK) LOCK FLUSH 100 UNIT/ML IV SOLN
500.0000 [IU] | Freq: Once | INTRAVENOUS | Status: AC
  Administered 2013-01-13: 500 [IU] via INTRAVENOUS
  Filled 2013-01-13: qty 5

## 2013-01-13 MED ORDER — SODIUM CHLORIDE 0.9 % IV SOLN
INTRAVENOUS | Status: DC
Start: 2013-01-13 — End: 2013-01-13
  Administered 2013-01-13: 13:00:00 via INTRAVENOUS

## 2013-01-13 MED ORDER — SODIUM CHLORIDE 0.9 % IJ SOLN
10.0000 mL | INTRAMUSCULAR | Status: DC | PRN
  Administered 2013-01-13: 10 mL via INTRAVENOUS
  Filled 2013-01-13: qty 10

## 2013-01-13 MED ORDER — PROCHLORPERAZINE EDISYLATE 5 MG/ML IJ SOLN
10.0000 mg | Freq: Four times a day (QID) | INTRAMUSCULAR | Status: DC | PRN
  Administered 2013-01-13: 10 mg via INTRAVENOUS

## 2013-01-13 NOTE — Progress Notes (Signed)
1530 NS bolus complete; VSS patient states nausea is improved with compazine; patient tolerated chips and water while in infusion room today for IVF and states she believes she can continue to eat/drink at home; patient to return tomorrow for additional fluids, per AVS. Patient verbalizes understanding of when to call, and if nausea returns and patient is unable to eat/drink, she will call the clinic. Patient discharged to home with no complaints.

## 2013-01-13 NOTE — Progress Notes (Addendum)
Location of Breast Cancer: Right Upper Outer Quadrant  Histology per Pathology Report:  1. Breast, lumpectomy, right upper outer quadrant  - INVASIVE GRADE III DUCTAL CARCINOMA ASSOCIATED WITH BIOPSY SITE CHANGES.  - LARGEST RESIDUAL INVASIVE FOCUS MEASURES 0.1 CM IN GREATEST DIMENSION.  - LYMPH/VASCULAR INVASION IS NOT IDENTIFIED.  - MARGINS ARE NEGATIVE.  - SEE ONCOLOGY TEMPLATE.  2. Lymph node, sentinel, biopsy, right  - ONE BENIGN LYMPH NODE WITH NO TUMOR SEEN (0/1).  Receptor Status: ER(0), PR (0), Her2-neu (Neg), Ki - 67 (73%)  Did patient present with symptoms (if so, please note symptoms) or was this found on screening mammography?: Found on annual screening  Past/Anticipated interventions by surgeon, if [id number] Lumpectomy  Past/Anticipated interventions by medical oncology, if any: Chemotherapy-  Docetaxel/Cytoxan Q 3 Weeks x 4 cycles: 11/04/12 - 01/06/13  Lymphedema issues, if any:  Pain issues, if any: No Pain  SAFETY ISSUES:  Prior radiation? no  Pacemaker/ICD? no  Possible current pregnancy?no  Is the patient on methotrexate? no Current Complaints / other details: Past hx of squamous cell carcinoma of the vulva - Surgery.  Diane [name] is experiencing diarrhea and nausea.  She received IVF hydration on yesterday and will receive more today.  She c/o weakness and has a decreased appetite.

## 2013-01-13 NOTE — Progress Notes (Signed)
ID: Diane [name]   DOB: [birth year]  MR#: [id number]  CSN#:[id number]  PCP: Diane Ser, [name] GYN:  SU: Diane [name] OTHER [name]: Diane [name], Diane [name], Diane [name]   HISTORY OF PRESENT ILLNESS: Diane had routine screening mammography at [facility] hospital 08/26/2012 showing a possible distortion in Diane right breast. Additional views of Diane breast Center 08/29/2012 confirmed a spiculated mass in Diane right breast measuring approximately 9 mm. This was not palpable. Ultrasound confirmed an irregular mass in Diane area in question, again measuring 9 mm. Diane right axilla was benign.  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 Diane patient's subsequent history is as detailed below.  INTERVAL HISTORY: Diane returns today for followup of her right breast carcinoma. She is currently day 8 cycle 4 of 4 planned adjuvant cycles of docetaxel/cyclophosphamide given in Diane adjuvant setting.   She receives Neulasta on day 2 for granulocyte support.  Diane is still extremely weak today. Fortunately, she denies any fevers. She's had some ongoing queasiness and has problems with "gagging". For instance she vomited this morning when brushing her teeth. Food tastes terrible. She's having a difficult time eating, and is not keeping herself is well hydrated if she should. She continues to have loose bowel movements, typically 2-3 per day, and takes Questran affectively.  REVIEW OF SYSTEMS: Diane denies any rashes and has had no abnormal bruising or bleeding. She denies any mouth ulcers or oral sensitivity. Her appetite is reduced and she has taste aversion.  She has an occasional cough. She continues to have shortness of breath with exertion which is stable has had some bony aches and pains following Diane Neulasta injection last week, primarily in her back and her lower extremities. She continues  to have some very slight neuropathy in her fingers and toes, with very little change. 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  A detailed review of systems is otherwise noncontributory.  PAST MEDICAL HISTORY: Past Medical History  Diagnosis Date  . Lower back pain   . Herniated disc   . Arthritis   . Lumbar stenosis     L4-5  . Spondylolysis   . Coronary artery disease   . Hypertension   . Hyperlipemia   . 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 Breast cancer   . Depression   . Dysrhythmia     afib  . Shortness of breath     exertion  . Anxiety   . Diabetes mellitus without complication     diet  . Headache(784.0)   . On antineoplastic chemotherapy Start 11/04/12    Docetaxel/Cytoxan Q 3 Weeks x 4 cycles    PAST SURGICAL HISTORY: Past Surgical History  Procedure Laterality Date  . Wide local excision  01/2010    Bilateral groin excisions, re-excision of vulva  . Posterior lumbar arthrodesis, pedicle screw fixation, posterolateral arthodesis  03/17/11  . Tonsillectomy    . Abdominal hysterectomy    . Dilation and curettage of uterus    . Tubal ligation    . Colonoscopy    . Shoulder arthroscopy with rotator cuff repair and subacromial decompression  06/04/2012    Procedure: SHOULDER ARTHROSCOPY WITH ROTATOR CUFF REPAIR AND SUBACROMIAL DECOMPRESSION;  Surgeon: Diane Sells, [name];  Location: [place];  Service: Orthopedics;  Laterality: Left;  LEFT SHOULDER ARTHROSCOPY WITH ROTATOR CUFF REPAIR  AND SUBACROMIAL DECOMPRESSION AND DISTAL CLAVICLE RESECTION  . Partial mastectomy with needle localization and axillary sentinel lymph node bx Right 10/08/2012    Procedure: RIGHT PARTIAL MASTECTOMY WITH NEEDLE LOCALIZATION AND RIGHT AXILLARY SENTINEL LYMPH NODE BX;  Surgeon: Diane Hector, [name];  Location: [place];  Service: General;  Laterality: Right;  Injection of 1% Methylene Blue dye into right breast  .  Portacath placement Left 10/08/2012    Procedure: INSERTION PORT-A-CATH WITH ULTRASOUND GUIDANCE;  Surgeon: Diane Hector, [name];  Location: [place];  Service: General;  Laterality: Left;  Using 38F [place]  . Vulvectomy partial      FAMILY HISTORY Family History  Problem Relation Age of Onset  . Stroke Mother   . Alzheimer's disease Mother   . Heart attack [name]   . Heart attack Brother    Diane [name] died from a heart attack at Diane age of 56. Diane patient's mother died at age 26 in Diane setting of Alzheimer's disease. Diane had 4 brothers, 3 sisters. There is no history of breast or ovarian cancer in Diane family to her knowledge.  GYNECOLOGIC HISTORY: Menarche age 65, first live birth age 28, she is [place] P3. She had a total abdominal hysterectomy with bilateral salpingo-oophorectomy in 1988. She took estrogen for approximately 30 years, stopping in January 2013.  SOCIAL HISTORY: Diane [name] used to work for [employer]. When she retired she moved to [place], which is for her husband was from. He died approximately 11 years ago. Diane patient currently lives alone. Her daughter Diane [name] "hovers over me". Diane [name] works at [employer] on [address]. Diane other 2 children are daughter Diane [name], who lives in [place] and works in a hospital, and Diane [name], who lives in [place] and is a housewife. Diane patient has 5 grandchildren. She is a Furniture conservator/restorer.  ADVANCED DIRECTIVES: Not in place  HEALTH MAINTENANCE: History  Substance Use Topics  . Smoking status: Former Smoker -- 50 years    Types: Cigarettes    Quit date: 05/29/2009  . Smokeless tobacco: Never Used  . Alcohol Use: No     Colonoscopy:  PAP:  Bone density: June 2011 at [facility] hospital; normal  Lipid panel:  Allergies  Allergen Reactions  . Shellfish Allergy Swelling    Current Outpatient Prescriptions  Medication Sig Dispense Refill  .  cholestyramine (QUESTRAN) 4 G packet Take 1 packet by mouth 2 (two) times daily with a meal. For diarrhea  60 each  12  . dabigatran (PRADAXA) 75 MG CAPS Take 75 mg by mouth every 12 (twelve) hours.      [name] Kitchen dexamethasone (DECADRON) 4 MG tablet 2 tabs by mouth twice daily on day before and 3 days after each chemo dose  30 tablet  1  . ferrous sulfate 325 (65 FE) MG tablet Take 325 mg by mouth daily with breakfast.      . FLUoxetine (PROZAC) 10 MG capsule Take 10 mg by mouth daily.      [name] Kitchen HYDROcodone-acetaminophen (NORCO/VICODIN) 5-325 MG per tablet Take 1-2 tablets by mouth every 4 (four) hours as needed for pain.  40 tablet  1  . lidocaine-prilocaine (EMLA) cream Apply to port 1-2 hours before each scheduled procedure  30 g  1  . LORazepam (ATIVAN) 0.5 MG tablet Take 1 tablet (0.5 mg total) by mouth at bedtime as needed for anxiety (Nausea or vomiting).  30 tablet  0  . nebivolol (  BYSTOLIC) 5 MG tablet Take 5 mg by mouth daily.      . nitrofurantoin, macrocrystal-monohydrate, (MACROBID) 100 MG capsule Take 1 capsule (100 mg total) by mouth 2 (two) times daily. For 10 days.  20 capsule  0  . NITROSTAT 0.4 MG SL tablet       . nystatin (MYCOSTATIN) 100000 UNIT/ML suspension Take 5 mLs (500,000 Units total) by mouth 4 (four) times daily. (swish and swallow)  240 mL  0  . ondansetron (ZOFRAN) 8 MG tablet 1 tab by mouth twice daily x 3 days after chemo, then every 12 hrs as needed for nause  30 tablet  1  . potassium chloride SA (K-DUR,KLOR-CON) 20 MEQ tablet Take 1 tablet (20 mEq total) by mouth daily.  30 tablet  3  . pravastatin (PRAVACHOL) 40 MG tablet Take 40 mg by mouth every evening.      . prochlorperazine (COMPAZINE) 10 MG tablet 1 tab by mouth with meals and bedtime x 2 days after each chemo, then every 6 hrs as needed for nausea  30 tablet  2  . tobramycin-dexamethasone (TOBRADEX) ophthalmic solution Place 1 drop into both eyes 2 (two) times daily. X 7 days after each chemo dose  5 mL  1  .  traZODone (DESYREL) 50 MG tablet Take 50 mg by mouth at bedtime.      . ciprofloxacin (CIPRO) 500 MG tablet Take 1 tablet (500 mg total) by mouth 2 (two) times daily.  14 tablet  0  . fluconazole (DIFLUCAN) 100 MG tablet 2 tabs by mouth x 1 day, then 1 tab by mouth daily  8 tablet  0   No current facility-administered medications for this visit.    OBJECTIVE: Middle-aged [place] American woman who appears weak and tired, but is in no acute distress Filed Vitals:   01/13/13 1143  BP: 123/78  Pulse: 109  Temp: 98.4 F (36.9 C)  Resp: 20     Body mass index is 31.46 kg/(m^2).    ECOG FS: 1 Filed Weights   01/13/13 1143  Weight: 200 lb 14.4 oz (91.128 kg)   Sclerae unicteric Oropharynx is notable for a white coating in Diane buccal mucosa and also on Diane tongue, consistent with candidiasis; buccal mucosa is dry. No cervical or supraclavicular adenopathy Lungs clear to auscultation, no wheezes, no rales or rhonchi Heart regular rate and rhythm Abdomen soft, obese, nontender to palpation, positive bowel sounds. MSK no focal spinal tenderness, no peripheral edema Neuro: nonfocal, well oriented,  fatigued affect Breasts: Deferred.  Axillae benign bilaterally, no palpable adenopathy noted Port is intact in Diane left upper chest wall with no edema, erythema, or evidence of infection Skin turgor is fair   LAB RESULTS: Lab Results  Component Value Date   WBC 2.7* 01/13/2013   NEUTROABS 0.7* 01/13/2013   HGB 9.1* 01/13/2013   HCT 28.1* 01/13/2013   MCV 81.4 01/13/2013   PLT 150 01/13/2013      Chemistry      Component Value Date/Time   NA 139 01/06/2013 1108   NA 139 10/03/2012 1441   K 4.1 01/06/2013 1108   K 3.2* 10/03/2012 1441   CL 105 11/04/2012 0937   CL 101 10/03/2012 1441   CO2 19* 01/06/2013 1108   CO2 27 10/03/2012 1441   BUN 6.5* 01/06/2013 1108   BUN 19 10/03/2012 1441   CREATININE 1.1 01/06/2013 1108   CREATININE 1.36* 10/03/2012 1441      Component Value Date/Time  CALCIUM 8.4  01/06/2013 1108   CALCIUM 9.2 10/03/2012 1441   ALKPHOS 60 01/06/2013 1108   ALKPHOS 65 10/03/2012 1441   AST 25 01/06/2013 1108   AST 22 10/03/2012 1441   ALT 13 01/06/2013 1108   ALT 9 10/03/2012 1441   BILITOT 0.37 01/06/2013 1108   BILITOT 0.2* 10/03/2012 1441       STUDIES:  No results found.    ASSESSMENT: 72 y.o. [place] woman   (1) s/p [facility] central breast biopsy 08/30/2012 for a clinical T1b No, stage IA invasive ductal carcinoma, grade 3, triple negative, with an MIB-1 of 70%. Diane largest extent of tumor on Diane initial biopsy was 0.4 cm  (2)  S/p right lumpectomy under Diane care of Dr. Dalbert Batman on 10/08/2012 for a pT1a, pN0, grade 3 invasive ductal carcinoma, with largest residual invasive focus measuring 0.1 cm in greatest dimension. There was no lymphovascular invasion, with 0 of one lymph node involved. Margins were clear.  (3)  status post adjuvant chemotherapy consisting of 4 q. three-week doses of docetaxel/Cytoxan, first dose on 11/04/2012, and last dose on 01/06/2013.  Chemotherapy to be followed by radiation therapy.   PLAN: Diane is happy to have completed her adjuvant chemotherapy. She will receive supportive IV fluids today for mild dehydration and nausea, and we will do Diane same tomorrow, 01/14/2013. She'll continue to take Questran as needed for loose stools. I am starting her on Cipro prophylactically for neutropenia. We reviewed neutropenic precautions and she understands to contact us with any fevers of 100 or above. We will repeat a CBC in 1 week. I am also starting her on Diflucan for oropharyngeal candidiasis. She will take 2 tablets today, then one tablet daily to complete one week.  She'll see Dr. Isidore Moos tomorrow, 01/14/2013, to discuss her upcoming radiation therapy, and is also scheduled to meet with both Dr. Rogelio Seen and Dr. Dalbert Batman in September. She will see me again in early October after completion of radiation therapy to discuss long-term followup plans.  She  voices her understanding and agreement with Diane above plan, and knows to call with any changes or problems.   BERRY,AMY    01/13/2013

## 2013-01-13 NOTE — Telephone Encounter (Signed)
appts made and printed...td 

## 2013-01-13 NOTE — Telephone Encounter (Signed)
Per staff phone call and POF I have schedueld appts.  JMW  

## 2013-01-13 NOTE — Patient Instructions (Addendum)
Dehydration, Adult Dehydration is when you lose more fluids from the body than you take in. Vital organs like the kidneys, brain, and heart cannot function without a proper amount of fluids and salt. Any loss of fluids from the body can cause dehydration.  CAUSES   Vomiting.  Diarrhea.  Excessive sweating.  Excessive urine output.  Fever. SYMPTOMS  Mild dehydration  Thirst.  Dry lips.  Slightly dry mouth. Moderate dehydration  Very dry mouth.  Sunken eyes.  Skin does not bounce back quickly when lightly pinched and released.  Dark urine and decreased urine production.  Decreased tear production.  Headache. Severe dehydration  Very dry mouth.  Extreme thirst.  Rapid, weak pulse (more than 100 beats per minute at rest).  Cold hands and feet.  Not able to sweat in spite of heat and temperature.  Rapid breathing.  Blue lips.  Confusion and lethargy.  Difficulty being awakened.  Minimal urine production.  No tears. DIAGNOSIS  Your caregiver will diagnose dehydration based on your symptoms and your exam. Blood and urine tests will help confirm the diagnosis. The diagnostic evaluation should also identify the cause of dehydration. TREATMENT  Treatment of mild or moderate dehydration can often be done at home by increasing the amount of fluids that you drink. It is best to drink small amounts of fluid more often. Drinking too much at one time can make vomiting worse. Refer to the home care instructions below. Severe dehydration needs to be treated at the hospital where you will probably be given intravenous (IV) fluids that contain water and electrolytes. HOME CARE INSTRUCTIONS   Ask your caregiver about specific rehydration instructions.  Drink enough fluids to keep your urine clear or pale yellow.  Drink small amounts frequently if you have nausea and vomiting.  Eat as you normally do.  Avoid:  Foods or drinks high in sugar.  Carbonated  drinks.  Juice.  Extremely hot or cold fluids.  Drinks with caffeine.  Fatty, greasy foods.  Alcohol.  Tobacco.  Overeating.  Gelatin desserts.  Wash your hands well to avoid spreading bacteria and viruses.  Only take over-the-counter or prescription medicines for pain, discomfort, or fever as directed by your caregiver.  Ask your caregiver if you should continue all prescribed and over-the-counter medicines.  Keep all follow-up appointments with your caregiver. SEEK MEDICAL CARE IF:  You have abdominal pain and it increases or stays in one area (localizes).  You have a rash, stiff neck, or severe headache.  You are irritable, sleepy, or difficult to awaken.  You are weak, dizzy, or extremely thirsty. SEEK IMMEDIATE MEDICAL CARE IF:   You are unable to keep fluids down or you get worse despite treatment.  You have frequent episodes of vomiting or diarrhea.  You have blood or green matter (bile) in your vomit.  You have blood in your stool or your stool looks black and tarry.  You have not urinated in 6 to 8 hours, or you have only urinated a small amount of very dark urine.  You have a fever.  You faint. MAKE SURE YOU:   Understand these instructions.  Will watch your condition.  Will get help right away if you are not doing well or get worse. Document Released: 05/15/2005 Document Revised: 08/07/2011 Document Reviewed: 01/02/2011 ExitCare Patient Information 2014 [employer].  

## 2013-01-14 ENCOUNTER — Ambulatory Visit
Admission: RE | Admit: 2013-01-14 | Discharge: 2013-01-14 | Disposition: A | Discharge status: Home / Self Care | Payer: Medicare Other | Source: Ambulatory Visit | Attending: Radiation Oncology | Admitting: Radiation Oncology

## 2013-01-14 ENCOUNTER — Ambulatory Visit (HOSPITAL_BASED_OUTPATIENT_CLINIC_OR_DEPARTMENT_OTHER): Payer: Medicare Other

## 2013-01-14 ENCOUNTER — Encounter: Payer: Self-pay | Admitting: Radiation Oncology

## 2013-01-14 ENCOUNTER — Other Ambulatory Visit: Payer: Self-pay | Admitting: Physician Assistant

## 2013-01-14 VITALS — BP 116/65 | HR 101 | Temp 97.6°F | Resp 16 | Ht 67.0 in | Wt 194.6 lb

## 2013-01-14 DIAGNOSIS — B370 Candidal stomatitis: Secondary | ICD-10-CM | POA: Diagnosis not present

## 2013-01-14 DIAGNOSIS — Z510 Encounter for antineoplastic radiation therapy: Secondary | ICD-10-CM | POA: Diagnosis not present

## 2013-01-14 DIAGNOSIS — R0602 Shortness of breath: Secondary | ICD-10-CM | POA: Diagnosis not present

## 2013-01-14 DIAGNOSIS — M7989 Other specified soft tissue disorders: Secondary | ICD-10-CM | POA: Insufficient documentation

## 2013-01-14 DIAGNOSIS — R05 Cough: Secondary | ICD-10-CM | POA: Diagnosis not present

## 2013-01-14 DIAGNOSIS — M25579 Pain in unspecified ankle and joints of unspecified foot: Secondary | ICD-10-CM | POA: Insufficient documentation

## 2013-01-14 DIAGNOSIS — Z79899 Other long term (current) drug therapy: Secondary | ICD-10-CM | POA: Insufficient documentation

## 2013-01-14 DIAGNOSIS — Z5189 Encounter for other specified aftercare: Secondary | ICD-10-CM

## 2013-01-14 DIAGNOSIS — R5383 Other fatigue: Secondary | ICD-10-CM | POA: Insufficient documentation

## 2013-01-14 DIAGNOSIS — C50111 Malignant neoplasm of central portion of right female breast: Secondary | ICD-10-CM

## 2013-01-14 DIAGNOSIS — R5381 Other malaise: Secondary | ICD-10-CM | POA: Insufficient documentation

## 2013-01-14 DIAGNOSIS — Z9221 Personal history of antineoplastic chemotherapy: Secondary | ICD-10-CM | POA: Diagnosis not present

## 2013-01-14 DIAGNOSIS — C50919 Malignant neoplasm of unspecified site of unspecified female breast: Secondary | ICD-10-CM | POA: Insufficient documentation

## 2013-01-14 DIAGNOSIS — N63 Unspecified lump in unspecified breast: Secondary | ICD-10-CM | POA: Diagnosis not present

## 2013-01-14 DIAGNOSIS — R197 Diarrhea, unspecified: Secondary | ICD-10-CM | POA: Insufficient documentation

## 2013-01-14 DIAGNOSIS — Z171 Estrogen receptor negative status [ER-]: Secondary | ICD-10-CM | POA: Insufficient documentation

## 2013-01-14 DIAGNOSIS — R059 Cough, unspecified: Secondary | ICD-10-CM | POA: Diagnosis not present

## 2013-01-14 DIAGNOSIS — R110 Nausea: Secondary | ICD-10-CM | POA: Diagnosis not present

## 2013-01-14 DIAGNOSIS — C50119 Malignant neoplasm of central portion of unspecified female breast: Secondary | ICD-10-CM | POA: Diagnosis not present

## 2013-01-14 DIAGNOSIS — C50411 Malignant neoplasm of upper-outer quadrant of right female breast: Secondary | ICD-10-CM

## 2013-01-14 HISTORY — DX: Personal history of antineoplastic chemotherapy: Z92.21

## 2013-01-14 MED ORDER — PROCHLORPERAZINE EDISYLATE 5 MG/ML IJ SOLN: 10.0000 mg | Freq: Once | INTRAMUSCULAR | Status: DC

## 2013-01-14 MED ORDER — SODIUM CHLORIDE 0.9 % IJ SOLN
10.0000 mL | INTRAMUSCULAR | Status: DC | PRN
  Administered 2013-01-14: 10 mL via INTRAVENOUS
  Filled 2013-01-14: qty 10

## 2013-01-14 MED ORDER — SODIUM CHLORIDE 0.9 % IV SOLN
INTRAVENOUS | Status: AC
  Administered 2013-01-14: 14:00:00 via INTRAVENOUS

## 2013-01-14 MED ORDER — HEPARIN SOD (PORK) LOCK FLUSH 100 UNIT/ML IV SOLN
500.0000 [IU] | Freq: Once | INTRAVENOUS | Status: AC
  Administered 2013-01-14: 500 [IU] via INTRAVENOUS
  Filled 2013-01-14: qty 5

## 2013-01-14 NOTE — Progress Notes (Signed)
  Radiation Oncology         [phone] ________________________________  Name: Diane [name] MRN: [id number]  Date: 01/14/2013  DOB: 28-Dec-1940  SIMULATION AND TREATMENT PLANNING NOTE  Outpatient  DIAGNOSIS:  [id number] Right Breast Cancer  NARRATIVE:  The patient was brought to the [place] suite.  Identity was confirmed.  All relevant records and images related to the planned course of therapy were reviewed.  The patient freely provided informed written consent to proceed with treatment after reviewing the details related to the planned course of therapy. The consent form was witnessed and verified by the simulation staff.    Then, the patient was set-up in a stable reproducible  supine position for radiation therapy.  CT images were obtained.  Surface markings were placed.  The CT images were loaded into the planning software.    TREATMENT PLANNING NOTE: Treatment planning then occurred.  The radiation prescription was entered and confirmed.    A total of 3 medically necessary complex treatment devices were fabricated and supervised by me- accuform, and 2 fields with MLCs to block lung/liver.  I requested dose calc's, isodose plan  The patient will receive 46 Gy Gy in 23 fractions to the whole breast with 2 tangential fields, followed by 14 Gy in 7 fractions as a boost to the lumpectomy cavity   -----------------------------------  Eppie Gibson, MD

## 2013-01-14 NOTE — Progress Notes (Signed)
Radiation Oncology         [phone] ________________________________  Name: Diane [name] MRN: [id number]  Date: 01/14/2013  DOB: 09-Aug-1940  Follow-Up Visit Note  Outpatient  CC: Myrtis Ser, MD  Deatra Robinson, MD  Diagnosis and Prior Radiotherapy:   [id number] Invasive Ductal Carcinoma, Grade III triple Negative, right breast   Narrative: The patient returns today for follow-up.   She underwent surgery in the form of lumpectomy and SLN bx on 10/08/2012. Pathology revealed a 0.1 cm focus of residual disease. Sentinel lymph node was negative. Her margins are clear. No lymphovascular space invasion.   She has completed 4 cycles of Q three-week doses of docetaxel/Cytoxan, first dose was given 11/04/2012. Last dose 01/06/13.  She will receive supportive IV fluids today for diarrhea. She is taking Questran as needed for loose stools (but it doesn't help) and Cipro prophylactically for neutropenia. She started Diflucan for oropharyngeal candidiasis. We just gave her some Imodium and she will pick up more at the store today.                          ALLERGIES:  is allergic to shellfish allergy.  Meds: Current Outpatient Prescriptions  Medication Sig Dispense Refill  . cholestyramine (QUESTRAN) 4 G packet Take 1 packet by mouth 2 (two) times daily with a meal. For diarrhea  60 each  12  . ciprofloxacin (CIPRO) 500 MG tablet Take 1 tablet (500 mg total) by mouth 2 (two) times daily.  14 tablet  0  . dabigatran (PRADAXA) 75 MG CAPS Take 75 mg by mouth every 12 (twelve) hours.      [name] Kitchen dexamethasone (DECADRON) 4 MG tablet 2 tabs by mouth twice daily on day before and 3 days after each chemo dose  30 tablet  1  . ferrous sulfate 325 (65 FE) MG tablet Take 325 mg by mouth daily with breakfast.      . fluconazole (DIFLUCAN) 100 MG tablet 2 tabs by mouth x 1 day, then 1 tab by mouth daily  8 tablet  0  . FLUoxetine (PROZAC) 10 MG capsule Take 10 mg by mouth daily.      [name] Kitchen  HYDROcodone-acetaminophen (NORCO/VICODIN) 5-325 MG per tablet Take 1-2 tablets by mouth every 4 (four) hours as needed for pain.  40 tablet  1  . lidocaine-prilocaine (EMLA) cream Apply to port 1-2 hours before each scheduled procedure  30 g  1  . LORazepam (ATIVAN) 0.5 MG tablet Take 1 tablet (0.5 mg total) by mouth at bedtime as needed for anxiety (Nausea or vomiting).  30 tablet  0  . nebivolol (BYSTOLIC) 5 MG tablet Take 5 mg by mouth daily.      . nitrofurantoin, macrocrystal-monohydrate, (MACROBID) 100 MG capsule Take 1 capsule (100 mg total) by mouth 2 (two) times daily. For 10 days.  20 capsule  0  . NITROSTAT 0.4 MG SL tablet       . nystatin (MYCOSTATIN) 100000 UNIT/ML suspension Take 5 mLs (500,000 Units total) by mouth 4 (four) times daily. (swish and swallow)  240 mL  0  . ondansetron (ZOFRAN) 8 MG tablet 1 tab by mouth twice daily x 3 days after chemo, then every 12 hrs as needed for nause  30 tablet  1  . potassium chloride SA (K-DUR,KLOR-CON) 20 MEQ tablet Take 1 tablet (20 mEq total) by mouth daily.  30 tablet  3  . pravastatin (PRAVACHOL) 40 MG tablet Take 40  mg by mouth every evening.      . prochlorperazine (COMPAZINE) 10 MG tablet 1 tab by mouth with meals and bedtime x 2 days after each chemo, then every 6 hrs as needed for nausea  30 tablet  2  . tobramycin-dexamethasone (TOBRADEX) ophthalmic solution Place 1 drop into both eyes 2 (two) times daily. X 7 days after each chemo dose  5 mL  1  . traZODone (DESYREL) 50 MG tablet Take 50 mg by mouth at bedtime.       No current facility-administered medications for this encounter.    Physical Findings: The patient is in no acute distress. Patient is alert and oriented.  height is 5\' 7"  (1.702 m) and weight is 194 lb 9.6 oz (88.27 kg). Her temperature is 97.6 F (36.4 C). Her blood pressure is 116/65 and her pulse is 101. Her respiration is 16. .  Lumpectomy scar well healed.  Lab Findings: Lab Results  Component Value Date    WBC 2.7* 01/13/2013   HGB 9.1* 01/13/2013   HCT 28.1* 01/13/2013   MCV 81.4 01/13/2013   PLT 150 01/13/2013    Radiographic Findings: No results found.  Impression/Plan:  Proceed with simulation today.  Anticipate 6-7 weeks of RT to right breast. Consent form has been signed as of June - we reviewed the most common side effects today. She is enthusiastic to proceed.  I spent 10 minutes face to face with the patient and more than 50% of that time was spent in counseling and/or coordination of care. _____________________________________   Eppie Gibson, MD

## 2013-01-14 NOTE — Progress Notes (Signed)
See progress noted under physician encounter.  

## 2013-01-14 NOTE — Addendum Note (Signed)
Encounter addended by: Deirdre Evener, RN on: 01/14/2013  4:28 PM<BR>     Documentation filed: Notes Section

## 2013-01-14 NOTE — Addendum Note (Signed)
Encounter addended by: Deirdre Evener, RN on: 01/14/2013  6:27 PM<BR>     Documentation filed: Charges VN

## 2013-01-17 DIAGNOSIS — R059 Cough, unspecified: Secondary | ICD-10-CM | POA: Diagnosis not present

## 2013-01-17 DIAGNOSIS — R0602 Shortness of breath: Secondary | ICD-10-CM | POA: Diagnosis not present

## 2013-01-17 DIAGNOSIS — M25579 Pain in unspecified ankle and joints of unspecified foot: Secondary | ICD-10-CM | POA: Diagnosis not present

## 2013-01-17 DIAGNOSIS — M7989 Other specified soft tissue disorders: Secondary | ICD-10-CM | POA: Diagnosis not present

## 2013-01-17 DIAGNOSIS — Z510 Encounter for antineoplastic radiation therapy: Secondary | ICD-10-CM | POA: Diagnosis not present

## 2013-01-17 DIAGNOSIS — C50919 Malignant neoplasm of unspecified site of unspecified female breast: Secondary | ICD-10-CM | POA: Diagnosis not present

## 2013-01-17 DIAGNOSIS — R05 Cough: Secondary | ICD-10-CM | POA: Diagnosis not present

## 2013-01-17 DIAGNOSIS — C50119 Malignant neoplasm of central portion of unspecified female breast: Secondary | ICD-10-CM | POA: Diagnosis not present

## 2013-01-20 ENCOUNTER — Other Ambulatory Visit (HOSPITAL_BASED_OUTPATIENT_CLINIC_OR_DEPARTMENT_OTHER): Payer: Medicare Other | Admitting: Lab

## 2013-01-20 DIAGNOSIS — R197 Diarrhea, unspecified: Secondary | ICD-10-CM | POA: Diagnosis not present

## 2013-01-20 DIAGNOSIS — N390 Urinary tract infection, site not specified: Secondary | ICD-10-CM | POA: Diagnosis not present

## 2013-01-20 DIAGNOSIS — G609 Hereditary and idiopathic neuropathy, unspecified: Secondary | ICD-10-CM | POA: Diagnosis not present

## 2013-01-20 DIAGNOSIS — Z5189 Encounter for other specified aftercare: Secondary | ICD-10-CM | POA: Diagnosis not present

## 2013-01-20 DIAGNOSIS — B370 Candidal stomatitis: Secondary | ICD-10-CM | POA: Diagnosis not present

## 2013-01-20 DIAGNOSIS — C519 Malignant neoplasm of vulva, unspecified: Secondary | ICD-10-CM | POA: Diagnosis not present

## 2013-01-20 DIAGNOSIS — C50911 Malignant neoplasm of unspecified site of right female breast: Secondary | ICD-10-CM

## 2013-01-20 DIAGNOSIS — Z171 Estrogen receptor negative status [ER-]: Secondary | ICD-10-CM | POA: Diagnosis not present

## 2013-01-20 DIAGNOSIS — R5381 Other malaise: Secondary | ICD-10-CM | POA: Diagnosis not present

## 2013-01-20 DIAGNOSIS — C50419 Malignant neoplasm of upper-outer quadrant of unspecified female breast: Secondary | ICD-10-CM | POA: Diagnosis not present

## 2013-01-20 DIAGNOSIS — R0602 Shortness of breath: Secondary | ICD-10-CM | POA: Diagnosis not present

## 2013-01-20 DIAGNOSIS — C50119 Malignant neoplasm of central portion of unspecified female breast: Secondary | ICD-10-CM

## 2013-01-20 DIAGNOSIS — R319 Hematuria, unspecified: Secondary | ICD-10-CM | POA: Diagnosis not present

## 2013-01-20 DIAGNOSIS — Z8544 Personal history of malignant neoplasm of other female genital organs: Secondary | ICD-10-CM | POA: Diagnosis not present

## 2013-01-20 DIAGNOSIS — C50919 Malignant neoplasm of unspecified site of unspecified female breast: Secondary | ICD-10-CM | POA: Diagnosis not present

## 2013-01-20 DIAGNOSIS — Z5111 Encounter for antineoplastic chemotherapy: Secondary | ICD-10-CM | POA: Diagnosis not present

## 2013-01-20 DIAGNOSIS — E785 Hyperlipidemia, unspecified: Secondary | ICD-10-CM | POA: Diagnosis not present

## 2013-01-20 DIAGNOSIS — R350 Frequency of micturition: Secondary | ICD-10-CM | POA: Diagnosis not present

## 2013-01-20 DIAGNOSIS — R110 Nausea: Secondary | ICD-10-CM | POA: Diagnosis not present

## 2013-01-20 DIAGNOSIS — I10 Essential (primary) hypertension: Secondary | ICD-10-CM | POA: Diagnosis not present

## 2013-01-20 LAB — CBC WITH DIFFERENTIAL/PLATELET
BASO%: 1.2 % (ref 0.0–2.0)
Basophils Absolute: 0.1 10*3/uL (ref 0.0–0.1)
EOS%: 0.1 % (ref 0.0–7.0)
Eosinophils Absolute: 0 10*3/uL (ref 0.0–0.5)
HCT: 28.6 % — ABNORMAL LOW (ref 34.8–46.6)
HGB: 9.3 g/dL — ABNORMAL LOW (ref 11.6–15.9)
LYMPH%: 15.7 % (ref 14.0–49.7)
MCH: 27.5 pg (ref 25.1–34.0)
MCHC: 32.6 g/dL (ref 31.5–36.0)
MCV: 84.3 fL (ref 79.5–101.0)
MONO#: 1.1 10*3/uL — ABNORMAL HIGH (ref 0.1–0.9)
MONO%: 13.4 % (ref 0.0–14.0)
NEUT#: 5.8 10*3/uL (ref 1.5–6.5)
NEUT%: 69.6 % (ref 38.4–76.8)
Platelets: 94 10*3/uL — ABNORMAL LOW (ref 145–400)
RBC: 3.39 10*6/uL — ABNORMAL LOW (ref 3.70–5.45)
RDW: 18.5 % — ABNORMAL HIGH (ref 11.2–14.5)
WBC: 8.3 10*3/uL (ref 3.9–10.3)
lymph#: 1.3 10*3/uL (ref 0.9–3.3)

## 2013-01-20 LAB — COMPREHENSIVE METABOLIC PANEL (CC13)
ALT: 18 U/L (ref 0–55)
AST: 34 U/L (ref 5–34)
Albumin: 2.3 g/dL — ABNORMAL LOW (ref 3.5–5.0)
Alkaline Phosphatase: 76 U/L (ref 40–150)
BUN: 6.6 mg/dL — ABNORMAL LOW (ref 7.0–26.0)
CO2: 22 mEq/L (ref 22–29)
Calcium: 8.1 mg/dL — ABNORMAL LOW (ref 8.4–10.4)
Chloride: 112 mEq/L — ABNORMAL HIGH (ref 98–109)
Creatinine: 1.1 mg/dL (ref 0.6–1.1)
Glucose: 100 mg/dl (ref 70–140)
Potassium: 3.5 mEq/L (ref 3.5–5.1)
Sodium: 143 mEq/L (ref 136–145)
Total Bilirubin: 0.4 mg/dL (ref 0.20–1.20)
Total Protein: 5.8 g/dL — ABNORMAL LOW (ref 6.4–8.3)

## 2013-01-21 ENCOUNTER — Ambulatory Visit: Admission: RE | Admit: 2013-01-21 | Payer: Medicare Other | Source: Ambulatory Visit | Admitting: Radiation Oncology

## 2013-01-22 ENCOUNTER — Ambulatory Visit: Payer: Medicare Other

## 2013-01-22 ENCOUNTER — Ambulatory Visit
Admission: RE | Admit: 2013-01-22 | Discharge: 2013-01-22 | Disposition: A | Discharge status: Home / Self Care | Payer: Medicare Other | Source: Ambulatory Visit | Attending: Radiation Oncology | Admitting: Radiation Oncology

## 2013-01-22 DIAGNOSIS — M7989 Other specified soft tissue disorders: Secondary | ICD-10-CM | POA: Diagnosis not present

## 2013-01-22 DIAGNOSIS — R059 Cough, unspecified: Secondary | ICD-10-CM | POA: Diagnosis not present

## 2013-01-22 DIAGNOSIS — C50119 Malignant neoplasm of central portion of unspecified female breast: Secondary | ICD-10-CM | POA: Diagnosis not present

## 2013-01-22 DIAGNOSIS — M25579 Pain in unspecified ankle and joints of unspecified foot: Secondary | ICD-10-CM | POA: Diagnosis not present

## 2013-01-22 DIAGNOSIS — C50919 Malignant neoplasm of unspecified site of unspecified female breast: Secondary | ICD-10-CM | POA: Diagnosis not present

## 2013-01-22 DIAGNOSIS — C50111 Malignant neoplasm of central portion of right female breast: Secondary | ICD-10-CM

## 2013-01-22 DIAGNOSIS — R0602 Shortness of breath: Secondary | ICD-10-CM | POA: Diagnosis not present

## 2013-01-22 DIAGNOSIS — Z510 Encounter for antineoplastic radiation therapy: Secondary | ICD-10-CM | POA: Diagnosis not present

## 2013-01-22 DIAGNOSIS — R05 Cough: Secondary | ICD-10-CM | POA: Diagnosis not present

## 2013-01-22 NOTE — Progress Notes (Signed)
Simulation Verification Note Outpatient Right Breast  The patient was brought to the treatment unit and placed in the planned treatment position. The clinical setup was verified. Then port films were obtained and uploaded to the radiation oncology medical record software.  The treatment beams were carefully compared against the planned radiation fields. The position location and shape of the radiation fields was reviewed. They targeted volume of tissue appears to be appropriately covered by the radiation beams. Organs at risk appear to be excluded as planned.  Based on my personal review, I approved the simulation verification. The patient's treatment will proceed as planned.  -----------------------------------  Eppie Gibson, MD

## 2013-01-23 ENCOUNTER — Ambulatory Visit
Admission: RE | Admit: 2013-01-23 | Discharge: 2013-01-23 | Disposition: A | Discharge status: Home / Self Care | Payer: Medicare Other | Source: Ambulatory Visit | Attending: Radiation Oncology | Admitting: Radiation Oncology

## 2013-01-23 ENCOUNTER — Other Ambulatory Visit: Payer: Self-pay | Admitting: *Deleted

## 2013-01-23 ENCOUNTER — Ambulatory Visit: Payer: Medicare Other

## 2013-01-23 ENCOUNTER — Ambulatory Visit (HOSPITAL_COMMUNITY)
Admission: RE | Admit: 2013-01-23 | Discharge: 2013-01-23 | Disposition: A | Discharge status: Home / Self Care | Payer: Medicare Other | Source: Ambulatory Visit | Attending: Oncology | Admitting: Oncology

## 2013-01-23 ENCOUNTER — Telehealth: Payer: Self-pay | Admitting: *Deleted

## 2013-01-23 DIAGNOSIS — C50111 Malignant neoplasm of central portion of right female breast: Secondary | ICD-10-CM

## 2013-01-23 DIAGNOSIS — M7989 Other specified soft tissue disorders: Secondary | ICD-10-CM

## 2013-01-23 DIAGNOSIS — C50119 Malignant neoplasm of central portion of unspecified female breast: Secondary | ICD-10-CM | POA: Diagnosis not present

## 2013-01-23 DIAGNOSIS — R05 Cough: Secondary | ICD-10-CM | POA: Diagnosis not present

## 2013-01-23 DIAGNOSIS — C50919 Malignant neoplasm of unspecified site of unspecified female breast: Secondary | ICD-10-CM | POA: Diagnosis not present

## 2013-01-23 DIAGNOSIS — Z510 Encounter for antineoplastic radiation therapy: Secondary | ICD-10-CM | POA: Diagnosis not present

## 2013-01-23 DIAGNOSIS — R059 Cough, unspecified: Secondary | ICD-10-CM | POA: Diagnosis not present

## 2013-01-23 DIAGNOSIS — R0602 Shortness of breath: Secondary | ICD-10-CM | POA: Diagnosis not present

## 2013-01-23 DIAGNOSIS — M25579 Pain in unspecified ankle and joints of unspecified foot: Secondary | ICD-10-CM | POA: Diagnosis not present

## 2013-01-23 NOTE — Telephone Encounter (Signed)
Pt left message stating concern over bilateral swelling in both feet with left ankle larger the right. Left ankle " has soreness " which is not evident in right ankle.  Pt is S/P chemo with docetaxel noted as one of the treatment agents. Pt also has history of some pre-existing neuropathy per diabetes.  Plan per above is to proceed to doppler study of left ankle for evaluation for DVT.

## 2013-01-24 ENCOUNTER — Ambulatory Visit
Admission: RE | Admit: 2013-01-24 | Discharge: 2013-01-24 | Disposition: A | Discharge status: Home / Self Care | Payer: Medicare Other | Source: Ambulatory Visit | Attending: Radiation Oncology | Admitting: Radiation Oncology

## 2013-01-24 DIAGNOSIS — R059 Cough, unspecified: Secondary | ICD-10-CM | POA: Diagnosis not present

## 2013-01-24 DIAGNOSIS — R05 Cough: Secondary | ICD-10-CM | POA: Diagnosis not present

## 2013-01-24 DIAGNOSIS — C50919 Malignant neoplasm of unspecified site of unspecified female breast: Secondary | ICD-10-CM | POA: Diagnosis not present

## 2013-01-24 DIAGNOSIS — M25579 Pain in unspecified ankle and joints of unspecified foot: Secondary | ICD-10-CM | POA: Diagnosis not present

## 2013-01-24 DIAGNOSIS — M7989 Other specified soft tissue disorders: Secondary | ICD-10-CM | POA: Diagnosis not present

## 2013-01-24 DIAGNOSIS — Z510 Encounter for antineoplastic radiation therapy: Secondary | ICD-10-CM | POA: Diagnosis not present

## 2013-01-24 DIAGNOSIS — R0602 Shortness of breath: Secondary | ICD-10-CM | POA: Diagnosis not present

## 2013-01-24 NOTE — Addendum Note (Signed)
Encounter addended by: Deirdre Evener, RN on: 01/24/2013  4:25 PM<BR>     Documentation filed: Charges VN

## 2013-01-28 ENCOUNTER — Ambulatory Visit
Admission: RE | Admit: 2013-01-28 | Discharge: 2013-01-28 | Disposition: A | Discharge status: Home / Self Care | Payer: Medicare Other | Source: Ambulatory Visit | Attending: Radiation Oncology | Admitting: Radiation Oncology

## 2013-01-28 VITALS — BP 127/64 | HR 79 | Temp 98.7°F | Ht 67.0 in | Wt 208.3 lb

## 2013-01-28 DIAGNOSIS — M7989 Other specified soft tissue disorders: Secondary | ICD-10-CM | POA: Diagnosis not present

## 2013-01-28 DIAGNOSIS — R059 Cough, unspecified: Secondary | ICD-10-CM | POA: Diagnosis not present

## 2013-01-28 DIAGNOSIS — R0602 Shortness of breath: Secondary | ICD-10-CM | POA: Diagnosis not present

## 2013-01-28 DIAGNOSIS — R05 Cough: Secondary | ICD-10-CM | POA: Diagnosis not present

## 2013-01-28 DIAGNOSIS — C50111 Malignant neoplasm of central portion of right female breast: Secondary | ICD-10-CM

## 2013-01-28 DIAGNOSIS — M25579 Pain in unspecified ankle and joints of unspecified foot: Secondary | ICD-10-CM | POA: Diagnosis not present

## 2013-01-28 DIAGNOSIS — Z510 Encounter for antineoplastic radiation therapy: Secondary | ICD-10-CM | POA: Diagnosis not present

## 2013-01-28 DIAGNOSIS — C50919 Malignant neoplasm of unspecified site of unspecified female breast: Secondary | ICD-10-CM | POA: Diagnosis not present

## 2013-01-28 MED ORDER — RADIAPLEXRX EX GEL
Freq: Once | CUTANEOUS | Status: AC
  Administered 2013-01-28: 11:00:00 via TOPICAL

## 2013-01-28 MED ORDER — ALRA NON-METALLIC DEODORANT (RAD-ONC)
1.0000 "application " | Freq: Once | TOPICAL | Status: AC
  Administered 2013-01-28: 1 via TOPICAL

## 2013-01-28 NOTE — Progress Notes (Signed)
   Weekly Management Note:  outpatient Current Dose:  6 Gy  Projected Dose: 46 Gy  + boost  Narrative:  The patient presents for routine under treatment assessment.  CBCT/MVCT images/Port film x-rays were reviewed.  The chart was checked. Bilateral ankle pain/swelling; L>R.  Sees foot doctor this week. No DVT on [place]. Diarrhea resolved.  Physical Findings:  height is 5\' 7"  (1.702 m) and weight is 208 lb 4.8 oz (94.484 kg). Her temperature is 98.7 F (37.1 C). Her blood pressure is 127/64 and her pulse is 79.  NAD, no obvious skin changes. Mild swelling, L ankle.  Impression:  The patient is tolerating radiotherapy.  Plan:  Continue radiotherapy as planned.   ________________________________   Eppie Gibson, M.D.

## 2013-01-28 NOTE — Progress Notes (Signed)
Diane [name] here for weekly under treat visit.  She has had 3 fractions to her right breast.  She does have pain in both her legs with the left have worse pain.  She is rating it at 8/10.  She recently had testing done on her left leg and she does not have a blood clot.  The skin on her right breast and underarm has some hyperpigmentation.  She was given the Radiation Therapy and You book and discussed side effects including fatigue, hair loss and skin changes.  She was given radiaplex gel and was instructed to apply it twice a day after treatment and at bedtime.  She was oriented to the clinic and was educated on PUT days with Dr. Isidore Moos on Mondays.

## 2013-01-29 ENCOUNTER — Ambulatory Visit
Admission: RE | Admit: 2013-01-29 | Discharge: 2013-01-29 | Disposition: A | Discharge status: Home / Self Care | Payer: Medicare Other | Source: Ambulatory Visit | Attending: Radiation Oncology | Admitting: Radiation Oncology

## 2013-01-29 DIAGNOSIS — I10 Essential (primary) hypertension: Secondary | ICD-10-CM | POA: Diagnosis not present

## 2013-01-29 DIAGNOSIS — C50919 Malignant neoplasm of unspecified site of unspecified female breast: Secondary | ICD-10-CM | POA: Diagnosis not present

## 2013-01-29 DIAGNOSIS — R0602 Shortness of breath: Secondary | ICD-10-CM | POA: Diagnosis not present

## 2013-01-29 DIAGNOSIS — M25579 Pain in unspecified ankle and joints of unspecified foot: Secondary | ICD-10-CM | POA: Diagnosis not present

## 2013-01-29 DIAGNOSIS — E119 Type 2 diabetes mellitus without complications: Secondary | ICD-10-CM | POA: Diagnosis not present

## 2013-01-29 DIAGNOSIS — Z510 Encounter for antineoplastic radiation therapy: Secondary | ICD-10-CM | POA: Diagnosis not present

## 2013-01-29 DIAGNOSIS — R059 Cough, unspecified: Secondary | ICD-10-CM | POA: Diagnosis not present

## 2013-01-29 DIAGNOSIS — R05 Cough: Secondary | ICD-10-CM | POA: Diagnosis not present

## 2013-01-29 DIAGNOSIS — M7989 Other specified soft tissue disorders: Secondary | ICD-10-CM | POA: Diagnosis not present

## 2013-01-30 ENCOUNTER — Ambulatory Visit
Admission: RE | Admit: 2013-01-30 | Discharge: 2013-01-30 | Disposition: A | Discharge status: Home / Self Care | Payer: Medicare Other | Source: Ambulatory Visit | Attending: Radiation Oncology | Admitting: Radiation Oncology

## 2013-01-30 DIAGNOSIS — R059 Cough, unspecified: Secondary | ICD-10-CM | POA: Diagnosis not present

## 2013-01-30 DIAGNOSIS — Z510 Encounter for antineoplastic radiation therapy: Secondary | ICD-10-CM | POA: Diagnosis not present

## 2013-01-30 DIAGNOSIS — R0602 Shortness of breath: Secondary | ICD-10-CM | POA: Diagnosis not present

## 2013-01-30 DIAGNOSIS — M25579 Pain in unspecified ankle and joints of unspecified foot: Secondary | ICD-10-CM | POA: Diagnosis not present

## 2013-01-30 DIAGNOSIS — M7989 Other specified soft tissue disorders: Secondary | ICD-10-CM | POA: Diagnosis not present

## 2013-01-30 DIAGNOSIS — R05 Cough: Secondary | ICD-10-CM | POA: Diagnosis not present

## 2013-01-30 DIAGNOSIS — C50919 Malignant neoplasm of unspecified site of unspecified female breast: Secondary | ICD-10-CM | POA: Diagnosis not present

## 2013-01-31 ENCOUNTER — Ambulatory Visit
Admission: RE | Admit: 2013-01-31 | Discharge: 2013-01-31 | Disposition: A | Discharge status: Home / Self Care | Payer: Medicare Other | Source: Ambulatory Visit | Attending: Radiation Oncology | Admitting: Radiation Oncology

## 2013-01-31 DIAGNOSIS — M7989 Other specified soft tissue disorders: Secondary | ICD-10-CM | POA: Diagnosis not present

## 2013-01-31 DIAGNOSIS — R05 Cough: Secondary | ICD-10-CM | POA: Diagnosis not present

## 2013-01-31 DIAGNOSIS — Z510 Encounter for antineoplastic radiation therapy: Secondary | ICD-10-CM | POA: Diagnosis not present

## 2013-01-31 DIAGNOSIS — R0602 Shortness of breath: Secondary | ICD-10-CM | POA: Diagnosis not present

## 2013-01-31 DIAGNOSIS — R059 Cough, unspecified: Secondary | ICD-10-CM | POA: Diagnosis not present

## 2013-01-31 DIAGNOSIS — C50119 Malignant neoplasm of central portion of unspecified female breast: Secondary | ICD-10-CM | POA: Diagnosis not present

## 2013-01-31 DIAGNOSIS — C50919 Malignant neoplasm of unspecified site of unspecified female breast: Secondary | ICD-10-CM | POA: Diagnosis not present

## 2013-01-31 DIAGNOSIS — M25579 Pain in unspecified ankle and joints of unspecified foot: Secondary | ICD-10-CM | POA: Diagnosis not present

## 2013-02-03 ENCOUNTER — Ambulatory Visit
Admission: RE | Admit: 2013-02-03 | Discharge: 2013-02-03 | Disposition: A | Discharge status: Home / Self Care | Payer: Medicare Other | Source: Ambulatory Visit | Attending: Radiation Oncology | Admitting: Radiation Oncology

## 2013-02-03 ENCOUNTER — Encounter: Payer: Self-pay | Admitting: Radiation Oncology

## 2013-02-03 VITALS — BP 150/81 | HR 97 | Temp 97.8°F | Resp 20 | Wt 210.5 lb

## 2013-02-03 DIAGNOSIS — C50111 Malignant neoplasm of central portion of right female breast: Secondary | ICD-10-CM

## 2013-02-03 DIAGNOSIS — R059 Cough, unspecified: Secondary | ICD-10-CM | POA: Diagnosis not present

## 2013-02-03 DIAGNOSIS — M25579 Pain in unspecified ankle and joints of unspecified foot: Secondary | ICD-10-CM | POA: Diagnosis not present

## 2013-02-03 DIAGNOSIS — Z510 Encounter for antineoplastic radiation therapy: Secondary | ICD-10-CM | POA: Diagnosis not present

## 2013-02-03 DIAGNOSIS — R0602 Shortness of breath: Secondary | ICD-10-CM | POA: Diagnosis not present

## 2013-02-03 DIAGNOSIS — M7989 Other specified soft tissue disorders: Secondary | ICD-10-CM | POA: Diagnosis not present

## 2013-02-03 DIAGNOSIS — R05 Cough: Secondary | ICD-10-CM | POA: Diagnosis not present

## 2013-02-03 DIAGNOSIS — C50919 Malignant neoplasm of unspecified site of unspecified female breast: Secondary | ICD-10-CM | POA: Diagnosis not present

## 2013-02-03 NOTE — Progress Notes (Signed)
Weekly rad txs, rt breast, slight erythema, swelling in breast, under inframmary fold, skin is thinning, , uses radiaplex  At night daily,encouraged to use after rad tx and again at bedtime, , seeing podiatrist tomorrow , says skin is peeling and flaky and feet turning black, has lymphedema ankle compression sock on 10:42 AM

## 2013-02-03 NOTE — Progress Notes (Signed)
   Weekly Management Note:  Outpatient Current Dose:  14 Gy  Projected Dose: 46 Gy initial  Narrative:  The patient presents for routine under treatment assessment.  CBCT/MVCT images/Port film x-rays were reviewed.  The chart was checked. NAD.  Breast more enlarged.  Skin darker. Skin intact  Physical Findings:  weight is 210 lb 8 oz (95.482 kg). Her oral temperature is 97.8 F (36.6 C). Her blood pressure is 150/81 and her pulse is 97. Her respiration is 20.  right breast a little swollen, but no significant seroma.  Skin modestly hyperpigmented and intact.  Impression:  The patient is tolerating radiotherapy.  Plan:  Continue radiotherapy as planned.   ________________________________   Eppie Gibson, M.D.

## 2013-02-04 ENCOUNTER — Ambulatory Visit
Admission: RE | Admit: 2013-02-04 | Discharge: 2013-02-04 | Disposition: A | Discharge status: Home / Self Care | Payer: Medicare Other | Source: Ambulatory Visit | Attending: Radiation Oncology | Admitting: Radiation Oncology

## 2013-02-04 DIAGNOSIS — L608 Other nail disorders: Secondary | ICD-10-CM | POA: Diagnosis not present

## 2013-02-04 DIAGNOSIS — Z510 Encounter for antineoplastic radiation therapy: Secondary | ICD-10-CM | POA: Diagnosis not present

## 2013-02-04 DIAGNOSIS — E1149 Type 2 diabetes mellitus with other diabetic neurological complication: Secondary | ICD-10-CM | POA: Diagnosis not present

## 2013-02-04 DIAGNOSIS — R059 Cough, unspecified: Secondary | ICD-10-CM | POA: Diagnosis not present

## 2013-02-04 DIAGNOSIS — C50919 Malignant neoplasm of unspecified site of unspecified female breast: Secondary | ICD-10-CM | POA: Diagnosis not present

## 2013-02-04 DIAGNOSIS — M7989 Other specified soft tissue disorders: Secondary | ICD-10-CM | POA: Diagnosis not present

## 2013-02-04 DIAGNOSIS — M25579 Pain in unspecified ankle and joints of unspecified foot: Secondary | ICD-10-CM | POA: Diagnosis not present

## 2013-02-04 DIAGNOSIS — R0602 Shortness of breath: Secondary | ICD-10-CM | POA: Diagnosis not present

## 2013-02-04 DIAGNOSIS — R05 Cough: Secondary | ICD-10-CM | POA: Diagnosis not present

## 2013-02-05 ENCOUNTER — Ambulatory Visit
Admission: RE | Admit: 2013-02-05 | Discharge: 2013-02-05 | Disposition: A | Discharge status: Home / Self Care | Payer: Medicare Other | Source: Ambulatory Visit | Attending: Radiation Oncology | Admitting: Radiation Oncology

## 2013-02-05 DIAGNOSIS — C50919 Malignant neoplasm of unspecified site of unspecified female breast: Secondary | ICD-10-CM | POA: Diagnosis not present

## 2013-02-05 DIAGNOSIS — M7989 Other specified soft tissue disorders: Secondary | ICD-10-CM | POA: Diagnosis not present

## 2013-02-05 DIAGNOSIS — R05 Cough: Secondary | ICD-10-CM | POA: Diagnosis not present

## 2013-02-05 DIAGNOSIS — Z510 Encounter for antineoplastic radiation therapy: Secondary | ICD-10-CM | POA: Diagnosis not present

## 2013-02-05 DIAGNOSIS — R0602 Shortness of breath: Secondary | ICD-10-CM | POA: Diagnosis not present

## 2013-02-05 DIAGNOSIS — R059 Cough, unspecified: Secondary | ICD-10-CM | POA: Diagnosis not present

## 2013-02-05 DIAGNOSIS — M25579 Pain in unspecified ankle and joints of unspecified foot: Secondary | ICD-10-CM | POA: Diagnosis not present

## 2013-02-06 ENCOUNTER — Ambulatory Visit
Admission: RE | Admit: 2013-02-06 | Discharge: 2013-02-06 | Disposition: A | Discharge status: Home / Self Care | Payer: Medicare Other | Source: Ambulatory Visit | Attending: Radiation Oncology | Admitting: Radiation Oncology

## 2013-02-06 ENCOUNTER — Ambulatory Visit: Payer: Medicare Other | Attending: Gynecologic Oncology | Admitting: Gynecologic Oncology

## 2013-02-06 ENCOUNTER — Encounter: Payer: Self-pay | Admitting: Gynecologic Oncology

## 2013-02-06 VITALS — BP 149/92 | HR 90 | Temp 97.7°F | Resp 20 | Ht 67.0 in | Wt 209.8 lb

## 2013-02-06 DIAGNOSIS — M25579 Pain in unspecified ankle and joints of unspecified foot: Secondary | ICD-10-CM | POA: Diagnosis not present

## 2013-02-06 DIAGNOSIS — Z171 Estrogen receptor negative status [ER-]: Secondary | ICD-10-CM | POA: Diagnosis not present

## 2013-02-06 DIAGNOSIS — I10 Essential (primary) hypertension: Secondary | ICD-10-CM | POA: Insufficient documentation

## 2013-02-06 DIAGNOSIS — E119 Type 2 diabetes mellitus without complications: Secondary | ICD-10-CM | POA: Diagnosis not present

## 2013-02-06 DIAGNOSIS — C50419 Malignant neoplasm of upper-outer quadrant of unspecified female breast: Secondary | ICD-10-CM | POA: Diagnosis not present

## 2013-02-06 DIAGNOSIS — Z87891 Personal history of nicotine dependence: Secondary | ICD-10-CM | POA: Diagnosis not present

## 2013-02-06 DIAGNOSIS — G5690 Unspecified mononeuropathy of unspecified upper limb: Secondary | ICD-10-CM | POA: Diagnosis not present

## 2013-02-06 DIAGNOSIS — Z9221 Personal history of antineoplastic chemotherapy: Secondary | ICD-10-CM | POA: Insufficient documentation

## 2013-02-06 DIAGNOSIS — Z90710 Acquired absence of both cervix and uterus: Secondary | ICD-10-CM | POA: Insufficient documentation

## 2013-02-06 DIAGNOSIS — I2510 Atherosclerotic heart disease of native coronary artery without angina pectoris: Secondary | ICD-10-CM | POA: Insufficient documentation

## 2013-02-06 DIAGNOSIS — C519 Malignant neoplasm of vulva, unspecified: Secondary | ICD-10-CM

## 2013-02-06 DIAGNOSIS — Z510 Encounter for antineoplastic radiation therapy: Secondary | ICD-10-CM | POA: Diagnosis not present

## 2013-02-06 DIAGNOSIS — R0602 Shortness of breath: Secondary | ICD-10-CM | POA: Diagnosis not present

## 2013-02-06 DIAGNOSIS — Z79899 Other long term (current) drug therapy: Secondary | ICD-10-CM | POA: Insufficient documentation

## 2013-02-06 DIAGNOSIS — M7989 Other specified soft tissue disorders: Secondary | ICD-10-CM | POA: Diagnosis not present

## 2013-02-06 DIAGNOSIS — C50919 Malignant neoplasm of unspecified site of unspecified female breast: Secondary | ICD-10-CM | POA: Diagnosis not present

## 2013-02-06 DIAGNOSIS — E785 Hyperlipidemia, unspecified: Secondary | ICD-10-CM | POA: Insufficient documentation

## 2013-02-06 DIAGNOSIS — R059 Cough, unspecified: Secondary | ICD-10-CM | POA: Diagnosis not present

## 2013-02-06 DIAGNOSIS — R5381 Other malaise: Secondary | ICD-10-CM | POA: Diagnosis not present

## 2013-02-06 DIAGNOSIS — R5383 Other fatigue: Secondary | ICD-10-CM | POA: Insufficient documentation

## 2013-02-06 DIAGNOSIS — R05 Cough: Secondary | ICD-10-CM | POA: Diagnosis not present

## 2013-02-06 NOTE — Patient Instructions (Signed)
Plan to follow up in six months.  Please call in Nov. Or Dec 2014 to schedule an appointment for March 2015.

## 2013-02-06 NOTE — Progress Notes (Signed)
Consult Note: Gyn-Onc  Diane [name] 72 y.o. female  CC:  Chief Complaint  Patient presents with  . 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 That was a new diagnosis. She subsequently underwent bilateral groin excisions and re-excision of the vulva in September 2011. All lymph nodes were negative and the re-excision site showed no residual carcinoma. I last saw her in Sept. 2013, at which time her exam was  unremarkable. There was no evidence of recurrent disease. She comes in today for followup.   Interval History:  I last saw her on August 15, 2012. At that time she told me she was scheduled for mammogram on March 31. She did indeed have her routine screening mammography on March 31 it showed a possible distortion in the right breast. Additional views April 3 confirmed a spiculated mass in the right breast measuring 9 mm. It was not palpable. She underwent biopsy on 09/04/2012 that revealed an invasive ductal carcinoma, grade 3, triple negative. The patient then had 4 cycles of chemotherapy with docetaxel and Cytoxan given in the adjuvant setting. She completed that and is now currently undergoing radiation therapy to the right breast. She's had approximately 14 Gy or a planned 46 Gy treatment. She overall doing fairly well she really struggled with chemotherapy in terms of fatigue and in fact today only feels that her energy level is about 30%. 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 She saw her podiatrist recently for some left ankle swelling and she was encouraged to wear compression stocking. She states she's eating better she had some nausea and vomiting with the chemotherapy but that's resolved. She stay she's gained the weight she lost  back. She had some diarrhea with chemotherapy and that is now resolved. She denies any vulvar symptoms such as itching bleeding or pain. She does have a small amount of wheezing and occasional coughing. This is being managed by her other physicians.  Review of Systems:   Constitutional: As above  Denies fever. Skin: No rash, sores, jaundice, itching, or dryness.  Cardiovascular: No chest pain, shortness of breath, or edema  Pulmonary: As above Gastro Intestinal:  No nausea, vomiting, constipation, or diarrhea reported. No bright red blood per rectum or change in bowel movement.  Genitourinary: No frequency, urgency, or dysuria.  Denies vaginal bleeding and discharge.  Musculoskeletal: No myalgia, arthralgia, joint swelling or pain.  Neurologic: No weakness, + numbness and tingling as above, or change in gait.  Psychology: Doing well    Current Meds:  Outpatient Encounter Prescriptions as of 02/06/2013  Medication Sig Dispense Refill  . cholestyramine (QUESTRAN) 4 G packet Take 1 packet by mouth 2 (two) times daily with a meal. For diarrhea  60 each  12  . ciprofloxacin (CIPRO) 500 MG tablet Take 1 tablet (500 mg total) by mouth 2 (two) times daily.  14 tablet  0  . dabigatran (PRADAXA) 75 MG CAPS Take 75 mg by mouth every 12 (twelve) hours.      [name] Kitchen dexamethasone (DECADRON) 4 MG tablet 2 tabs by mouth twice daily on day before and 3 days after each chemo dose  30 tablet  1  . ferrous sulfate 325 (65 FE) MG tablet  Take 325 mg by mouth daily with breakfast.      . fluconazole (DIFLUCAN) 100 MG tablet 2 tabs by mouth x 1 day, then 1 tab by mouth daily  8 tablet  0  . FLUoxetine (PROZAC) 10 MG capsule Take 10 mg by mouth daily.      [name] Kitchen HYDROcodone-acetaminophen (NORCO/VICODIN) 5-325 MG per tablet Take 1-2 tablets by mouth every 4 (four) hours as needed for pain.  40 tablet  1  . lidocaine-prilocaine (EMLA) cream Apply to port 1-2 hours before each scheduled procedure  30 g  1  . LORazepam  (ATIVAN) 0.5 MG tablet Take 1 tablet (0.5 mg total) by mouth at bedtime as needed for anxiety (Nausea or vomiting).  30 tablet  0  . nebivolol (BYSTOLIC) 5 MG tablet Take 5 mg by mouth daily.      . nitrofurantoin, macrocrystal-monohydrate, (MACROBID) 100 MG capsule Take 1 capsule (100 mg total) by mouth 2 (two) times daily. For 10 days.  20 capsule  0  . NITROSTAT 0.4 MG SL tablet       . nystatin (MYCOSTATIN) 100000 UNIT/ML suspension Take 5 mLs (500,000 Units total) by mouth 4 (four) times daily. (swish and swallow)  240 mL  0  . ondansetron (ZOFRAN) 8 MG tablet 1 tab by mouth twice daily x 3 days after chemo, then every 12 hrs as needed for nause  30 tablet  1  . potassium chloride SA (K-DUR,KLOR-CON) 20 MEQ tablet Take 1 tablet (20 mEq total) by mouth daily.  30 tablet  3  . pravastatin (PRAVACHOL) 40 MG tablet Take 40 mg by mouth every evening.      . prochlorperazine (COMPAZINE) 10 MG tablet 1 tab by mouth with meals and bedtime x 2 days after each chemo, then every 6 hrs as needed for nausea  30 tablet  2  . tobramycin-dexamethasone (TOBRADEX) ophthalmic solution Place 1 drop into both eyes 2 (two) times daily. X 7 days after each chemo dose  5 mL  1  . traZODone (DESYREL) 50 MG tablet Take 50 mg by mouth at bedtime.       No facility-administered encounter medications on file as of 02/06/2013.    Allergy:  Allergies  Allergen Reactions  . Shellfish Allergy Swelling    Social Hx:   History   Social History  . Marital Status: Widowed    Spouse Name: N/A    Number of Children: N/A  . Years of Education: N/A   Occupational History  . Not on file.   Social History Main Topics  . Smoking status: Former Smoker -- 50 years    Types: Cigarettes    Quit date: 05/29/2009  . Smokeless tobacco: Never Used  . Alcohol Use: No  . Drug Use: No  . Sexual Activity: Not on file   Other Topics Concern  . Not on file   Social History Narrative  . No narrative on file    Past  Surgical Hx:  Past Surgical History  Procedure Laterality Date  . Wide local excision  01/2010    Bilateral groin excisions, re-excision of vulva  . Posterior lumbar arthrodesis, pedicle screw fixation, posterolateral arthodesis  03/17/11  . Tonsillectomy    . Abdominal hysterectomy    . Dilation and curettage of uterus    . Tubal ligation    . Colonoscopy    . Shoulder arthroscopy with rotator cuff repair and subacromial decompression  06/04/2012    Procedure: SHOULDER ARTHROSCOPY WITH ROTATOR  CUFF REPAIR AND SUBACROMIAL DECOMPRESSION;  Surgeon: Nita Sells, MD;  Location: [place];  Service: Orthopedics;  Laterality: Left;  LEFT SHOULDER ARTHROSCOPY WITH ROTATOR CUFF REPAIR AND SUBACROMIAL DECOMPRESSION AND DISTAL CLAVICLE RESECTION  . Partial mastectomy with needle localization and axillary sentinel lymph node bx Right 10/08/2012    Procedure: RIGHT PARTIAL MASTECTOMY WITH NEEDLE LOCALIZATION AND RIGHT AXILLARY SENTINEL LYMPH NODE BX;  Surgeon: Adin Hector, MD;  Location: [place];  Service: General;  Laterality: Right;  Injection of 1% Methylene Blue dye into right breast  . Portacath placement Left 10/08/2012    Procedure: INSERTION PORT-A-CATH WITH ULTRASOUND GUIDANCE;  Surgeon: Adin Hector, MD;  Location: [place];  Service: General;  Laterality: Left;  Using 70F [place]  . Vulvectomy partial      Past Medical Hx:  Past Medical History  Diagnosis Date  . Lower back pain   . Herniated disc   . Arthritis   . Lumbar stenosis     L4-5  . Spondylolysis   . Coronary artery disease   . Hypertension   . Hyperlipemia   . 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 Breast cancer   . Depression   . Dysrhythmia     afib  . Shortness of breath     exertion  . Anxiety   . Diabetes mellitus without complication     diet  . Headache(784.0)   . Status post chemotherapy 11/04/2012 - 01/06/2013.    Docetaxel/Cytoxan Q 3 Weeks x 4 cycles     Family Hx:  Family History  Problem Relation Age of Onset  . Stroke Mother   . Alzheimer's disease Mother   . Heart attack Father   . Heart attack Brother     Vitals:  Blood pressure 149/92, pulse 90, temperature 97.7 F (36.5 C), temperature source Oral, resp. rate 20, height 5\' 7"  (1.702 m), weight 209 lb 12.8 oz (95.165 kg).  Physical Exam:   GENERAL: Well-nourished, well-developed female in no acute distress.   NECK: Supple. There is no lymphadenopathy, no thyromegaly.   LUNGS: Clear to auscultation bilaterally.   CARDIOVASCULAR: Regular rate and rhythm.   ABDOMEN: Soft, nontender, nondistended. No palpable masses or hepatosplenomegaly. Groins are negative for adenopathy.   EXTREMITIES: There is no edema.   PELVIC: External genitalia is notable for surgical excisions. There is no evidence of any visible lesions. Bimanual examination reveals no thickening, masses, nodularity in the perineum of the vulva.   Assessment/Plan:  ASSESSMENT: 72 year old with a stage IB squamous cell carcinoma of vulva, who clinically has no evidence of recurrent disease, > 3 (7/11) years out from her diagnosis.   PLAN: Return to see [place] in 6 months. She will follow up with her other physicians as scheduled.    GEHRIG,PAOLA A., MD 02/06/2013, 11:18 AM

## 2013-02-07 ENCOUNTER — Ambulatory Visit
Admission: RE | Admit: 2013-02-07 | Discharge: 2013-02-07 | Disposition: A | Discharge status: Home / Self Care | Payer: Medicare Other | Source: Ambulatory Visit | Attending: Radiation Oncology | Admitting: Radiation Oncology

## 2013-02-07 DIAGNOSIS — C50919 Malignant neoplasm of unspecified site of unspecified female breast: Secondary | ICD-10-CM | POA: Diagnosis not present

## 2013-02-07 DIAGNOSIS — Z510 Encounter for antineoplastic radiation therapy: Secondary | ICD-10-CM | POA: Diagnosis not present

## 2013-02-07 DIAGNOSIS — M25579 Pain in unspecified ankle and joints of unspecified foot: Secondary | ICD-10-CM | POA: Diagnosis not present

## 2013-02-07 DIAGNOSIS — C50119 Malignant neoplasm of central portion of unspecified female breast: Secondary | ICD-10-CM | POA: Diagnosis not present

## 2013-02-07 DIAGNOSIS — R05 Cough: Secondary | ICD-10-CM | POA: Diagnosis not present

## 2013-02-07 DIAGNOSIS — M7989 Other specified soft tissue disorders: Secondary | ICD-10-CM | POA: Diagnosis not present

## 2013-02-07 DIAGNOSIS — R0602 Shortness of breath: Secondary | ICD-10-CM | POA: Diagnosis not present

## 2013-02-07 DIAGNOSIS — R059 Cough, unspecified: Secondary | ICD-10-CM | POA: Diagnosis not present

## 2013-02-10 ENCOUNTER — Ambulatory Visit
Admission: RE | Admit: 2013-02-10 | Discharge: 2013-02-10 | Disposition: A | Discharge status: Home / Self Care | Payer: Medicare Other | Source: Ambulatory Visit | Attending: Radiation Oncology | Admitting: Radiation Oncology

## 2013-02-10 ENCOUNTER — Encounter: Payer: Self-pay | Admitting: Radiation Oncology

## 2013-02-10 VITALS — BP 156/87 | HR 102 | Temp 97.7°F | Resp 18 | Wt 210.2 lb

## 2013-02-10 DIAGNOSIS — M7989 Other specified soft tissue disorders: Secondary | ICD-10-CM | POA: Diagnosis not present

## 2013-02-10 DIAGNOSIS — R0602 Shortness of breath: Secondary | ICD-10-CM | POA: Diagnosis not present

## 2013-02-10 DIAGNOSIS — M25579 Pain in unspecified ankle and joints of unspecified foot: Secondary | ICD-10-CM | POA: Diagnosis not present

## 2013-02-10 DIAGNOSIS — C50111 Malignant neoplasm of central portion of right female breast: Secondary | ICD-10-CM

## 2013-02-10 DIAGNOSIS — R059 Cough, unspecified: Secondary | ICD-10-CM | POA: Diagnosis not present

## 2013-02-10 DIAGNOSIS — R05 Cough: Secondary | ICD-10-CM

## 2013-02-10 DIAGNOSIS — Z510 Encounter for antineoplastic radiation therapy: Secondary | ICD-10-CM | POA: Diagnosis not present

## 2013-02-10 DIAGNOSIS — C50919 Malignant neoplasm of unspecified site of unspecified female breast: Secondary | ICD-10-CM | POA: Diagnosis not present

## 2013-02-10 MED ORDER — GUAIFENESIN-CODEINE 100-10 MG/5ML PO SOLN: 5.0000 mL | Freq: Three times a day (TID) | ORAL | Status: DC | PRN

## 2013-02-10 NOTE — Progress Notes (Signed)
Pt's insurance would not cover original script for Guaifensen - Codeine, therefore, a verbal order was given by Dr. Isidore Moos for Cheratussin [facility] which is covered, as suggested by [employer], the Pharmacist at [facility] [employer] at [facility].

## 2013-02-10 NOTE — Progress Notes (Signed)
   Weekly Management Note:  outpatient Current Dose:  24 Gy  Projected Dose: 60 Gy   Narrative:  The patient presents for routine under treatment assessment.  CBCT/MVCT images/Port film x-rays were reviewed.  The chart was checked. Coughing a lot since end of last week. With clear sputum and increased SOB.  Cough is quite severe - feels like strangling. No fevers  Physical Findings:  weight is 210 lb 3.2 oz (95.346 kg). Her oral temperature is 97.7 F (36.5 C). Her blood pressure is 156/87 and her pulse is 102. Her respiration is 18 and oxygen saturation is 100%.  NAD, lungs CTAB. Right breast hyperpigmented, skin itact  Impression:  The patient is tolerating radiotherapy.  Plan:  Continue radiotherapy as planned. rec'd calling her PCP to discuss if any w/u is needed for cough.  Too soon to expect pneumonitis from RT, which is very rare, and associated with DRY cough several weeks AFTER RT. Rx'd cough medicine with codeine  ________________________________   Eppie Gibson, M.D.

## 2013-02-10 NOTE — Progress Notes (Signed)
Concerned about persistent productive cough with clear sputum. Patient states,"I cough so much sometimes I strangle myself." Also, patient reports shortness of breath with mild exertion since Thursday. Faint hyperpigmentation of right axilla without desquamation. Reports using radiaplex bid as directed. Reports fatigue that has been persistent since chemotherapy. Denies sore throat or difficulty swallowing.

## 2013-02-11 ENCOUNTER — Encounter: Payer: Self-pay | Admitting: Radiation Oncology

## 2013-02-11 ENCOUNTER — Ambulatory Visit
Admission: RE | Admit: 2013-02-11 | Discharge: 2013-02-11 | Disposition: A | Discharge status: Home / Self Care | Payer: Medicare Other | Source: Ambulatory Visit | Attending: Radiation Oncology | Admitting: Radiation Oncology

## 2013-02-11 DIAGNOSIS — M7989 Other specified soft tissue disorders: Secondary | ICD-10-CM | POA: Diagnosis not present

## 2013-02-11 DIAGNOSIS — C50119 Malignant neoplasm of central portion of unspecified female breast: Secondary | ICD-10-CM | POA: Diagnosis not present

## 2013-02-11 DIAGNOSIS — C50919 Malignant neoplasm of unspecified site of unspecified female breast: Secondary | ICD-10-CM | POA: Diagnosis not present

## 2013-02-11 DIAGNOSIS — Z510 Encounter for antineoplastic radiation therapy: Secondary | ICD-10-CM | POA: Diagnosis not present

## 2013-02-11 DIAGNOSIS — R059 Cough, unspecified: Secondary | ICD-10-CM | POA: Diagnosis not present

## 2013-02-11 DIAGNOSIS — R05 Cough: Secondary | ICD-10-CM | POA: Diagnosis not present

## 2013-02-11 DIAGNOSIS — R0602 Shortness of breath: Secondary | ICD-10-CM | POA: Diagnosis not present

## 2013-02-11 DIAGNOSIS — M25579 Pain in unspecified ankle and joints of unspecified foot: Secondary | ICD-10-CM | POA: Diagnosis not present

## 2013-02-12 ENCOUNTER — Ambulatory Visit
Admission: RE | Admit: 2013-02-12 | Discharge: 2013-02-12 | Disposition: A | Discharge status: Home / Self Care | Payer: Medicare Other | Source: Ambulatory Visit | Attending: Radiation Oncology | Admitting: Radiation Oncology

## 2013-02-12 DIAGNOSIS — R059 Cough, unspecified: Secondary | ICD-10-CM | POA: Diagnosis not present

## 2013-02-12 DIAGNOSIS — R05 Cough: Secondary | ICD-10-CM | POA: Diagnosis not present

## 2013-02-12 DIAGNOSIS — M7989 Other specified soft tissue disorders: Secondary | ICD-10-CM | POA: Diagnosis not present

## 2013-02-12 DIAGNOSIS — C50919 Malignant neoplasm of unspecified site of unspecified female breast: Secondary | ICD-10-CM | POA: Diagnosis not present

## 2013-02-12 DIAGNOSIS — R0602 Shortness of breath: Secondary | ICD-10-CM | POA: Diagnosis not present

## 2013-02-12 DIAGNOSIS — M25579 Pain in unspecified ankle and joints of unspecified foot: Secondary | ICD-10-CM | POA: Diagnosis not present

## 2013-02-12 DIAGNOSIS — Z510 Encounter for antineoplastic radiation therapy: Secondary | ICD-10-CM | POA: Diagnosis not present

## 2013-02-13 ENCOUNTER — Ambulatory Visit
Admission: RE | Admit: 2013-02-13 | Discharge: 2013-02-13 | Disposition: A | Discharge status: Home / Self Care | Payer: Medicare Other | Source: Ambulatory Visit | Attending: Radiation Oncology | Admitting: Radiation Oncology

## 2013-02-13 DIAGNOSIS — M25579 Pain in unspecified ankle and joints of unspecified foot: Secondary | ICD-10-CM | POA: Diagnosis not present

## 2013-02-13 DIAGNOSIS — C50919 Malignant neoplasm of unspecified site of unspecified female breast: Secondary | ICD-10-CM | POA: Diagnosis not present

## 2013-02-13 DIAGNOSIS — M7989 Other specified soft tissue disorders: Secondary | ICD-10-CM | POA: Diagnosis not present

## 2013-02-13 DIAGNOSIS — R0602 Shortness of breath: Secondary | ICD-10-CM | POA: Diagnosis not present

## 2013-02-13 DIAGNOSIS — Z510 Encounter for antineoplastic radiation therapy: Secondary | ICD-10-CM | POA: Diagnosis not present

## 2013-02-13 DIAGNOSIS — R05 Cough: Secondary | ICD-10-CM | POA: Diagnosis not present

## 2013-02-13 DIAGNOSIS — R059 Cough, unspecified: Secondary | ICD-10-CM | POA: Diagnosis not present

## 2013-02-14 ENCOUNTER — Ambulatory Visit
Admission: RE | Admit: 2013-02-14 | Discharge: 2013-02-14 | Disposition: A | Discharge status: Home / Self Care | Payer: Medicare Other | Source: Ambulatory Visit | Attending: Radiation Oncology | Admitting: Radiation Oncology

## 2013-02-14 DIAGNOSIS — R05 Cough: Secondary | ICD-10-CM | POA: Diagnosis not present

## 2013-02-14 DIAGNOSIS — R0602 Shortness of breath: Secondary | ICD-10-CM | POA: Diagnosis not present

## 2013-02-14 DIAGNOSIS — C50919 Malignant neoplasm of unspecified site of unspecified female breast: Secondary | ICD-10-CM | POA: Diagnosis not present

## 2013-02-14 DIAGNOSIS — R059 Cough, unspecified: Secondary | ICD-10-CM | POA: Diagnosis not present

## 2013-02-14 DIAGNOSIS — M7989 Other specified soft tissue disorders: Secondary | ICD-10-CM | POA: Diagnosis not present

## 2013-02-14 DIAGNOSIS — C50119 Malignant neoplasm of central portion of unspecified female breast: Secondary | ICD-10-CM | POA: Diagnosis not present

## 2013-02-14 DIAGNOSIS — M25579 Pain in unspecified ankle and joints of unspecified foot: Secondary | ICD-10-CM | POA: Diagnosis not present

## 2013-02-14 DIAGNOSIS — Z510 Encounter for antineoplastic radiation therapy: Secondary | ICD-10-CM | POA: Diagnosis not present

## 2013-02-17 ENCOUNTER — Encounter: Payer: Self-pay | Admitting: Radiation Oncology

## 2013-02-17 ENCOUNTER — Ambulatory Visit (INDEPENDENT_AMBULATORY_CARE_PROVIDER_SITE_OTHER): Payer: Medicare Other | Admitting: General Surgery

## 2013-02-17 ENCOUNTER — Ambulatory Visit
Admission: RE | Admit: 2013-02-17 | Discharge: 2013-02-17 | Disposition: A | Discharge status: Home / Self Care | Payer: Medicare Other | Source: Ambulatory Visit | Attending: Radiation Oncology | Admitting: Radiation Oncology

## 2013-02-17 ENCOUNTER — Encounter (INDEPENDENT_AMBULATORY_CARE_PROVIDER_SITE_OTHER): Payer: Self-pay | Admitting: General Surgery

## 2013-02-17 ENCOUNTER — Ambulatory Visit: Admission: RE | Admit: 2013-02-17 | Payer: Medicare Other | Source: Ambulatory Visit | Admitting: Radiation Oncology

## 2013-02-17 VITALS — BP 126/64 | HR 59 | Temp 98.8°F | Ht 67.5 in | Wt 200.8 lb

## 2013-02-17 VITALS — BP 124/82 | HR 68 | Temp 97.3°F | Resp 14 | Ht 67.5 in | Wt 202.8 lb

## 2013-02-17 DIAGNOSIS — M25579 Pain in unspecified ankle and joints of unspecified foot: Secondary | ICD-10-CM | POA: Diagnosis not present

## 2013-02-17 DIAGNOSIS — C50119 Malignant neoplasm of central portion of unspecified female breast: Secondary | ICD-10-CM

## 2013-02-17 DIAGNOSIS — R05 Cough: Secondary | ICD-10-CM | POA: Diagnosis not present

## 2013-02-17 DIAGNOSIS — C50111 Malignant neoplasm of central portion of right female breast: Secondary | ICD-10-CM

## 2013-02-17 DIAGNOSIS — Z510 Encounter for antineoplastic radiation therapy: Secondary | ICD-10-CM | POA: Diagnosis not present

## 2013-02-17 DIAGNOSIS — R059 Cough, unspecified: Secondary | ICD-10-CM | POA: Diagnosis not present

## 2013-02-17 DIAGNOSIS — C50919 Malignant neoplasm of unspecified site of unspecified female breast: Secondary | ICD-10-CM | POA: Diagnosis not present

## 2013-02-17 DIAGNOSIS — R0602 Shortness of breath: Secondary | ICD-10-CM | POA: Diagnosis not present

## 2013-02-17 DIAGNOSIS — M7989 Other specified soft tissue disorders: Secondary | ICD-10-CM | POA: Diagnosis not present

## 2013-02-17 NOTE — Progress Notes (Signed)
Ms. Kouris has received 17 fractions to her right breast.  Marked hyperpigmentation noted especially in the inframmary fold, but her skin is intact.  She c/o tenderness, not "pain", but grades this as a level 7 on a scale of 0-10.  Using Radiaplex.

## 2013-02-17 NOTE — Patient Instructions (Signed)
Examination of your right breast reveals that all the wounds have healed well, and you have recovered from your surgery without any obvious surgical complications. Examination of both breasts and the lymph node areas is negative, and there is no sign of cancer.  We will remove your Port-A-Cath when Dr. Jana Hakim says that he is through using it. I will send him a message today to ask him` what his opinion  is.  Otherwise, we will plan for you to get bilateral diagnostic mammograms in March of 2015  Return to see Dr. Dalbert Batman in April or May, 2015.

## 2013-02-17 NOTE — Progress Notes (Signed)
Patient ID: Diane [name], female   DOB: Dec 01, 1940, 72 y.o.   MRN: [id number] History: This patient underwent right partial mastectomy and sentinel lymph node biopsy and Port-A-Cath insertion on 10/08/2012. She had a triple negative breast cancer, invasive ductal, T1a, N0. She has completed her chemotherapy, she thinks. She has had almost 4 weeks of radiation therapy now. She is followed by Dr. Alycia Rossetti  for vulvar cancer. She would like the Port-A-Cath removed. She has no complaints about her breast wounds  Exam:  Patient looks well. No distress. Neck reveals no adenopathy or mass Lungs clear to auscultation bilaterally Heart regular rate and rhythm, no murmur, no ectopy Breasts are large. Incision right breast upper outer quadrant and right axilla are well-healed. No palpable mass in either breast. A little bit of radiation change in the right breast skin. No desquamation.  Assessment: Invasive, triple negative breast cancer right breast, central upper outer quadrant, T1a,., N0. No evidence of recurrence 6 months postop right partial mastectomy, sentinel node biopsy, and Port-A-Cath insertion  status post adjuvant chemotherapy Currently undergoing adjuvant radiation therapy  Plan: Wiill send a message to  Dr. Jana Hakim asking when the Port-A-Cath can be removed. She says she would like this done under local anesthesia to save money and so she can drive herself home. The patient will get mammograms in March 2015 I will see the patient in April or May 2015.    Edsel Petrin. Dalbert Batman, M.D., [facility] General and Minimally invasive Surgery Breast and Colorectal Surgery Office:   [phone] Pager:   [phone]

## 2013-02-17 NOTE — Progress Notes (Signed)
   Weekly Management Note:  outpatient Current Dose:  34 Gy  Projected Dose: 60 Gy   Narrative:  The patient presents for routine under treatment assessment.  CBCT/MVCT images/Port film x-rays were reviewed.  The chart was checked. No new complaints.  She reports SOB, cough gone with cough medicine from last week.  Did not discuss with PCP.  Physical Findings:  height is 5' 7.5" (1.715 m) and weight is 200 lb 12.8 oz (91.082 kg). Her temperature is 98.8 F (37.1 C). Her blood pressure is 126/64 and her pulse is 59.  NAD, right breast swollen and hyperpigmented but skin intact.    Impression:  The patient is tolerating radiotherapy.  Plan:  Continue radiotherapy as planned.    ________________________________   Eppie Gibson, M.D.

## 2013-02-18 ENCOUNTER — Other Ambulatory Visit: Payer: Self-pay | Admitting: Oncology

## 2013-02-18 ENCOUNTER — Ambulatory Visit
Admission: RE | Admit: 2013-02-18 | Discharge: 2013-02-18 | Disposition: A | Discharge status: Home / Self Care | Payer: Medicare Other | Source: Ambulatory Visit | Attending: Radiation Oncology | Admitting: Radiation Oncology

## 2013-02-18 DIAGNOSIS — C50919 Malignant neoplasm of unspecified site of unspecified female breast: Secondary | ICD-10-CM | POA: Diagnosis not present

## 2013-02-18 DIAGNOSIS — M25579 Pain in unspecified ankle and joints of unspecified foot: Secondary | ICD-10-CM | POA: Diagnosis not present

## 2013-02-18 DIAGNOSIS — Z510 Encounter for antineoplastic radiation therapy: Secondary | ICD-10-CM | POA: Diagnosis not present

## 2013-02-18 DIAGNOSIS — M7989 Other specified soft tissue disorders: Secondary | ICD-10-CM | POA: Diagnosis not present

## 2013-02-18 DIAGNOSIS — R0602 Shortness of breath: Secondary | ICD-10-CM | POA: Diagnosis not present

## 2013-02-18 DIAGNOSIS — R05 Cough: Secondary | ICD-10-CM | POA: Diagnosis not present

## 2013-02-18 DIAGNOSIS — R059 Cough, unspecified: Secondary | ICD-10-CM | POA: Diagnosis not present

## 2013-02-19 ENCOUNTER — Ambulatory Visit
Admission: RE | Admit: 2013-02-19 | Discharge: 2013-02-19 | Disposition: A | Discharge status: Home / Self Care | Payer: Medicare Other | Source: Ambulatory Visit | Attending: Radiation Oncology | Admitting: Radiation Oncology

## 2013-02-19 DIAGNOSIS — M25519 Pain in unspecified shoulder: Secondary | ICD-10-CM | POA: Diagnosis not present

## 2013-02-19 DIAGNOSIS — C50919 Malignant neoplasm of unspecified site of unspecified female breast: Secondary | ICD-10-CM | POA: Diagnosis not present

## 2013-02-19 DIAGNOSIS — R0602 Shortness of breath: Secondary | ICD-10-CM | POA: Diagnosis not present

## 2013-02-19 DIAGNOSIS — Z510 Encounter for antineoplastic radiation therapy: Secondary | ICD-10-CM | POA: Diagnosis not present

## 2013-02-19 DIAGNOSIS — M25579 Pain in unspecified ankle and joints of unspecified foot: Secondary | ICD-10-CM | POA: Diagnosis not present

## 2013-02-19 DIAGNOSIS — M7989 Other specified soft tissue disorders: Secondary | ICD-10-CM | POA: Diagnosis not present

## 2013-02-19 DIAGNOSIS — R059 Cough, unspecified: Secondary | ICD-10-CM | POA: Diagnosis not present

## 2013-02-19 DIAGNOSIS — R05 Cough: Secondary | ICD-10-CM | POA: Diagnosis not present

## 2013-02-20 ENCOUNTER — Ambulatory Visit
Admission: RE | Admit: 2013-02-20 | Discharge: 2013-02-20 | Disposition: A | Discharge status: Home / Self Care | Payer: Medicare Other | Source: Ambulatory Visit | Attending: Radiation Oncology | Admitting: Radiation Oncology

## 2013-02-20 DIAGNOSIS — R05 Cough: Secondary | ICD-10-CM | POA: Diagnosis not present

## 2013-02-20 DIAGNOSIS — C50919 Malignant neoplasm of unspecified site of unspecified female breast: Secondary | ICD-10-CM | POA: Diagnosis not present

## 2013-02-20 DIAGNOSIS — M25579 Pain in unspecified ankle and joints of unspecified foot: Secondary | ICD-10-CM | POA: Diagnosis not present

## 2013-02-20 DIAGNOSIS — R0602 Shortness of breath: Secondary | ICD-10-CM | POA: Diagnosis not present

## 2013-02-20 DIAGNOSIS — M7989 Other specified soft tissue disorders: Secondary | ICD-10-CM | POA: Diagnosis not present

## 2013-02-20 DIAGNOSIS — R059 Cough, unspecified: Secondary | ICD-10-CM | POA: Diagnosis not present

## 2013-02-20 DIAGNOSIS — Z510 Encounter for antineoplastic radiation therapy: Secondary | ICD-10-CM | POA: Diagnosis not present

## 2013-02-21 ENCOUNTER — Ambulatory Visit: Payer: Medicare Other

## 2013-02-21 ENCOUNTER — Ambulatory Visit
Admission: RE | Admit: 2013-02-21 | Discharge: 2013-02-21 | Disposition: A | Discharge status: Home / Self Care | Payer: Medicare Other | Source: Ambulatory Visit | Attending: Radiation Oncology | Admitting: Radiation Oncology

## 2013-02-21 DIAGNOSIS — M7989 Other specified soft tissue disorders: Secondary | ICD-10-CM | POA: Diagnosis not present

## 2013-02-21 DIAGNOSIS — M25579 Pain in unspecified ankle and joints of unspecified foot: Secondary | ICD-10-CM | POA: Diagnosis not present

## 2013-02-21 DIAGNOSIS — Z510 Encounter for antineoplastic radiation therapy: Secondary | ICD-10-CM | POA: Diagnosis not present

## 2013-02-21 DIAGNOSIS — R059 Cough, unspecified: Secondary | ICD-10-CM | POA: Diagnosis not present

## 2013-02-21 DIAGNOSIS — R0602 Shortness of breath: Secondary | ICD-10-CM | POA: Diagnosis not present

## 2013-02-21 DIAGNOSIS — C50119 Malignant neoplasm of central portion of unspecified female breast: Secondary | ICD-10-CM | POA: Diagnosis not present

## 2013-02-21 DIAGNOSIS — R05 Cough: Secondary | ICD-10-CM | POA: Diagnosis not present

## 2013-02-21 DIAGNOSIS — C50919 Malignant neoplasm of unspecified site of unspecified female breast: Secondary | ICD-10-CM | POA: Diagnosis not present

## 2013-02-24 ENCOUNTER — Ambulatory Visit
Admission: RE | Admit: 2013-02-24 | Discharge: 2013-02-24 | Disposition: A | Discharge status: Home / Self Care | Payer: Medicare Other | Source: Ambulatory Visit | Attending: Radiation Oncology | Admitting: Radiation Oncology

## 2013-02-24 ENCOUNTER — Encounter: Payer: Self-pay | Admitting: Radiation Oncology

## 2013-02-24 VITALS — BP 133/54 | HR 65 | Temp 97.4°F | Resp 20 | Wt 199.8 lb

## 2013-02-24 DIAGNOSIS — M7989 Other specified soft tissue disorders: Secondary | ICD-10-CM | POA: Diagnosis not present

## 2013-02-24 DIAGNOSIS — Z510 Encounter for antineoplastic radiation therapy: Secondary | ICD-10-CM | POA: Diagnosis not present

## 2013-02-24 DIAGNOSIS — C50919 Malignant neoplasm of unspecified site of unspecified female breast: Secondary | ICD-10-CM | POA: Diagnosis not present

## 2013-02-24 DIAGNOSIS — R059 Cough, unspecified: Secondary | ICD-10-CM | POA: Diagnosis not present

## 2013-02-24 DIAGNOSIS — C50111 Malignant neoplasm of central portion of right female breast: Secondary | ICD-10-CM

## 2013-02-24 DIAGNOSIS — R0602 Shortness of breath: Secondary | ICD-10-CM | POA: Diagnosis not present

## 2013-02-24 DIAGNOSIS — R05 Cough: Secondary | ICD-10-CM | POA: Diagnosis not present

## 2013-02-24 DIAGNOSIS — M25579 Pain in unspecified ankle and joints of unspecified foot: Secondary | ICD-10-CM | POA: Diagnosis not present

## 2013-02-24 NOTE — Progress Notes (Signed)
Weekly rad tx right breast, hyperpigmentation dark , skin intact, dry looking under axilla,  C/o hurting when moving and dressing  Using radiplex 2-3x day 10:35 AM

## 2013-02-24 NOTE — Progress Notes (Signed)
   Weekly Management Note:  outpatient Current Dose:  44 Gy  Projected Dose: 60 Gy   Narrative:  The patient presents for routine under treatment assessment.  CBCT/MVCT images/Port film x-rays were reviewed.  The chart was checked. She says skin feels  "raw".    Physical Findings:  weight is 199 lb 12.8 oz (90.629 kg). Her oral temperature is 97.4 F (36.3 C). Her blood pressure is 133/54 and her pulse is 65. Her respiration is 20.  Axillary and inframammary fold hyperpigmentation, dryness. Skin intact  Impression:  The patient is tolerating radiotherapy.  Plan:  Continue radiotherapy as planned. Will try adding hydrogel pads to soothe skin. Neosporin if peeling occurs with moist desquamation later on.  ________________________________   Eppie Gibson, M.D.

## 2013-02-25 ENCOUNTER — Ambulatory Visit
Admission: RE | Admit: 2013-02-25 | Discharge: 2013-02-25 | Disposition: A | Discharge status: Home / Self Care | Payer: Medicare Other | Source: Ambulatory Visit | Attending: Radiation Oncology | Admitting: Radiation Oncology

## 2013-02-25 ENCOUNTER — Ambulatory Visit: Payer: Medicare Other

## 2013-02-25 ENCOUNTER — Telehealth (INDEPENDENT_AMBULATORY_CARE_PROVIDER_SITE_OTHER): Payer: Self-pay

## 2013-02-25 DIAGNOSIS — Z510 Encounter for antineoplastic radiation therapy: Secondary | ICD-10-CM | POA: Diagnosis not present

## 2013-02-25 DIAGNOSIS — R05 Cough: Secondary | ICD-10-CM | POA: Diagnosis not present

## 2013-02-25 DIAGNOSIS — R0602 Shortness of breath: Secondary | ICD-10-CM | POA: Diagnosis not present

## 2013-02-25 DIAGNOSIS — C50919 Malignant neoplasm of unspecified site of unspecified female breast: Secondary | ICD-10-CM | POA: Diagnosis not present

## 2013-02-25 DIAGNOSIS — M25579 Pain in unspecified ankle and joints of unspecified foot: Secondary | ICD-10-CM | POA: Diagnosis not present

## 2013-02-25 DIAGNOSIS — M7989 Other specified soft tissue disorders: Secondary | ICD-10-CM | POA: Diagnosis not present

## 2013-02-25 DIAGNOSIS — R059 Cough, unspecified: Secondary | ICD-10-CM | POA: Diagnosis not present

## 2013-02-25 NOTE — Telephone Encounter (Signed)
Called and spoke to pt about scheduling her for portacath removal under local in the office.  Appointment made for 03/24/2013 at 4pm.

## 2013-02-26 ENCOUNTER — Encounter: Payer: Self-pay | Admitting: Radiation Oncology

## 2013-02-26 ENCOUNTER — Ambulatory Visit
Admission: RE | Admit: 2013-02-26 | Discharge: 2013-02-26 | Disposition: A | Discharge status: Home / Self Care | Payer: Medicare Other | Source: Ambulatory Visit | Attending: Radiation Oncology | Admitting: Radiation Oncology

## 2013-02-26 ENCOUNTER — Ambulatory Visit: Payer: Medicare Other

## 2013-02-26 DIAGNOSIS — M7989 Other specified soft tissue disorders: Secondary | ICD-10-CM | POA: Diagnosis not present

## 2013-02-26 DIAGNOSIS — R059 Cough, unspecified: Secondary | ICD-10-CM | POA: Diagnosis not present

## 2013-02-26 DIAGNOSIS — C50119 Malignant neoplasm of central portion of unspecified female breast: Secondary | ICD-10-CM | POA: Diagnosis not present

## 2013-02-26 DIAGNOSIS — R05 Cough: Secondary | ICD-10-CM | POA: Diagnosis not present

## 2013-02-26 DIAGNOSIS — C50919 Malignant neoplasm of unspecified site of unspecified female breast: Secondary | ICD-10-CM | POA: Diagnosis not present

## 2013-02-26 DIAGNOSIS — Z510 Encounter for antineoplastic radiation therapy: Secondary | ICD-10-CM | POA: Diagnosis not present

## 2013-02-26 DIAGNOSIS — R0602 Shortness of breath: Secondary | ICD-10-CM | POA: Diagnosis not present

## 2013-02-26 DIAGNOSIS — M25579 Pain in unspecified ankle and joints of unspecified foot: Secondary | ICD-10-CM | POA: Diagnosis not present

## 2013-02-26 NOTE — Progress Notes (Signed)
Simulation verification note: The patient underwent simulation verification for her right breast boost. Her isocenter is in good position and the multileaf collimators contoured the treatment volume appropriately.

## 2013-02-27 ENCOUNTER — Ambulatory Visit
Admission: RE | Admit: 2013-02-27 | Discharge: 2013-02-27 | Disposition: A | Discharge status: Home / Self Care | Payer: Medicare Other | Source: Ambulatory Visit | Attending: Radiation Oncology | Admitting: Radiation Oncology

## 2013-02-27 DIAGNOSIS — R059 Cough, unspecified: Secondary | ICD-10-CM | POA: Diagnosis not present

## 2013-02-27 DIAGNOSIS — R0602 Shortness of breath: Secondary | ICD-10-CM | POA: Diagnosis not present

## 2013-02-27 DIAGNOSIS — C50919 Malignant neoplasm of unspecified site of unspecified female breast: Secondary | ICD-10-CM | POA: Diagnosis not present

## 2013-02-27 DIAGNOSIS — Z510 Encounter for antineoplastic radiation therapy: Secondary | ICD-10-CM | POA: Diagnosis not present

## 2013-02-27 DIAGNOSIS — M25579 Pain in unspecified ankle and joints of unspecified foot: Secondary | ICD-10-CM | POA: Diagnosis not present

## 2013-02-27 DIAGNOSIS — R05 Cough: Secondary | ICD-10-CM | POA: Diagnosis not present

## 2013-02-27 DIAGNOSIS — M7989 Other specified soft tissue disorders: Secondary | ICD-10-CM | POA: Diagnosis not present

## 2013-02-28 ENCOUNTER — Ambulatory Visit
Admission: RE | Admit: 2013-02-28 | Discharge: 2013-02-28 | Disposition: A | Discharge status: Home / Self Care | Payer: Medicare Other | Source: Ambulatory Visit | Attending: Radiation Oncology | Admitting: Radiation Oncology

## 2013-02-28 DIAGNOSIS — R059 Cough, unspecified: Secondary | ICD-10-CM | POA: Diagnosis not present

## 2013-02-28 DIAGNOSIS — M7989 Other specified soft tissue disorders: Secondary | ICD-10-CM | POA: Diagnosis not present

## 2013-02-28 DIAGNOSIS — C50119 Malignant neoplasm of central portion of unspecified female breast: Secondary | ICD-10-CM | POA: Diagnosis not present

## 2013-02-28 DIAGNOSIS — R0602 Shortness of breath: Secondary | ICD-10-CM | POA: Diagnosis not present

## 2013-02-28 DIAGNOSIS — Z510 Encounter for antineoplastic radiation therapy: Secondary | ICD-10-CM | POA: Diagnosis not present

## 2013-02-28 DIAGNOSIS — C50919 Malignant neoplasm of unspecified site of unspecified female breast: Secondary | ICD-10-CM | POA: Diagnosis not present

## 2013-02-28 DIAGNOSIS — R05 Cough: Secondary | ICD-10-CM | POA: Diagnosis not present

## 2013-02-28 DIAGNOSIS — M25579 Pain in unspecified ankle and joints of unspecified foot: Secondary | ICD-10-CM | POA: Diagnosis not present

## 2013-03-01 ENCOUNTER — Ambulatory Visit: Payer: Medicare Other

## 2013-03-03 ENCOUNTER — Ambulatory Visit
Admission: RE | Admit: 2013-03-03 | Discharge: 2013-03-03 | Disposition: A | Discharge status: Home / Self Care | Payer: Medicare Other | Source: Ambulatory Visit | Attending: Radiation Oncology | Admitting: Radiation Oncology

## 2013-03-03 ENCOUNTER — Encounter: Payer: Self-pay | Admitting: Radiation Oncology

## 2013-03-03 VITALS — BP 116/59 | HR 57 | Temp 97.9°F | Ht 67.7 in | Wt 198.2 lb

## 2013-03-03 DIAGNOSIS — R05 Cough: Secondary | ICD-10-CM | POA: Diagnosis not present

## 2013-03-03 DIAGNOSIS — R059 Cough, unspecified: Secondary | ICD-10-CM | POA: Diagnosis not present

## 2013-03-03 DIAGNOSIS — C50919 Malignant neoplasm of unspecified site of unspecified female breast: Secondary | ICD-10-CM | POA: Diagnosis not present

## 2013-03-03 DIAGNOSIS — R0602 Shortness of breath: Secondary | ICD-10-CM | POA: Diagnosis not present

## 2013-03-03 DIAGNOSIS — C50111 Malignant neoplasm of central portion of right female breast: Secondary | ICD-10-CM

## 2013-03-03 DIAGNOSIS — Z510 Encounter for antineoplastic radiation therapy: Secondary | ICD-10-CM | POA: Diagnosis not present

## 2013-03-03 DIAGNOSIS — M7989 Other specified soft tissue disorders: Secondary | ICD-10-CM | POA: Diagnosis not present

## 2013-03-03 DIAGNOSIS — M25579 Pain in unspecified ankle and joints of unspecified foot: Secondary | ICD-10-CM | POA: Diagnosis not present

## 2013-03-03 NOTE — Progress Notes (Signed)
Diane [name] has received 4/7 in her boost field.  Note dry desquamation at the edge of her right axilla and dry desquamation in the inframmary fold. For which she is applying neosporin.  Fatigued.  C/o soreness in the axcilla and the inframmary fold.

## 2013-03-03 NOTE — Progress Notes (Signed)
   Weekly Management Note:  outpatient Current Dose:  54 Gy  Projected Dose: 60 Gy   Narrative:  The patient presents for routine under treatment assessment.  CBCT/MVCT images/Port film x-rays were reviewed.  The chart was checked. Doing well. Soreness and fatigue continue.  Physical Findings:  height is 5' 7.7" (1.72 m) and weight is 198 lb 3.2 oz (89.903 kg). Her temperature is 97.9 F (36.6 C). Her blood pressure is 116/59 and her pulse is 57.  NAD, dry desquamation in R axilla/ IM fold.  Hyperpigmented R breast.  Impression:  The patient is tolerating radiotherapy.  Plan:  Continue radiotherapy as planned. F/u in 3-4 weeks.  ________________________________   Eppie Gibson, M.D.

## 2013-03-04 ENCOUNTER — Ambulatory Visit
Admission: RE | Admit: 2013-03-04 | Discharge: 2013-03-04 | Disposition: A | Discharge status: Home / Self Care | Payer: Medicare Other | Source: Ambulatory Visit | Attending: Radiation Oncology | Admitting: Radiation Oncology

## 2013-03-04 DIAGNOSIS — R0602 Shortness of breath: Secondary | ICD-10-CM | POA: Diagnosis not present

## 2013-03-04 DIAGNOSIS — M7989 Other specified soft tissue disorders: Secondary | ICD-10-CM | POA: Diagnosis not present

## 2013-03-04 DIAGNOSIS — Z510 Encounter for antineoplastic radiation therapy: Secondary | ICD-10-CM | POA: Diagnosis not present

## 2013-03-04 DIAGNOSIS — M25579 Pain in unspecified ankle and joints of unspecified foot: Secondary | ICD-10-CM | POA: Diagnosis not present

## 2013-03-04 DIAGNOSIS — R059 Cough, unspecified: Secondary | ICD-10-CM | POA: Diagnosis not present

## 2013-03-04 DIAGNOSIS — C50919 Malignant neoplasm of unspecified site of unspecified female breast: Secondary | ICD-10-CM | POA: Diagnosis not present

## 2013-03-04 DIAGNOSIS — R05 Cough: Secondary | ICD-10-CM | POA: Diagnosis not present

## 2013-03-05 ENCOUNTER — Ambulatory Visit
Admission: RE | Admit: 2013-03-05 | Discharge: 2013-03-05 | Disposition: A | Discharge status: Home / Self Care | Payer: Medicare Other | Source: Ambulatory Visit | Attending: Radiation Oncology | Admitting: Radiation Oncology

## 2013-03-05 ENCOUNTER — Ambulatory Visit (HOSPITAL_BASED_OUTPATIENT_CLINIC_OR_DEPARTMENT_OTHER): Payer: Medicare Other | Admitting: Physician Assistant

## 2013-03-05 ENCOUNTER — Telehealth: Payer: Self-pay | Admitting: *Deleted

## 2013-03-05 ENCOUNTER — Ambulatory Visit: Payer: Medicare Other

## 2013-03-05 ENCOUNTER — Other Ambulatory Visit (HOSPITAL_BASED_OUTPATIENT_CLINIC_OR_DEPARTMENT_OTHER): Payer: Medicare Other

## 2013-03-05 ENCOUNTER — Encounter: Payer: Self-pay | Admitting: Physician Assistant

## 2013-03-05 VITALS — BP 100/58 | HR 112 | Temp 97.9°F | Resp 18 | Ht 67.0 in | Wt 200.5 lb

## 2013-03-05 DIAGNOSIS — R197 Diarrhea, unspecified: Secondary | ICD-10-CM | POA: Diagnosis not present

## 2013-03-05 DIAGNOSIS — C50119 Malignant neoplasm of central portion of unspecified female breast: Secondary | ICD-10-CM

## 2013-03-05 DIAGNOSIS — Z8544 Personal history of malignant neoplasm of other female genital organs: Secondary | ICD-10-CM | POA: Diagnosis not present

## 2013-03-05 DIAGNOSIS — Z510 Encounter for antineoplastic radiation therapy: Secondary | ICD-10-CM | POA: Diagnosis not present

## 2013-03-05 DIAGNOSIS — B370 Candidal stomatitis: Secondary | ICD-10-CM | POA: Diagnosis not present

## 2013-03-05 DIAGNOSIS — C50111 Malignant neoplasm of central portion of right female breast: Secondary | ICD-10-CM

## 2013-03-05 DIAGNOSIS — M7989 Other specified soft tissue disorders: Secondary | ICD-10-CM | POA: Diagnosis not present

## 2013-03-05 DIAGNOSIS — R350 Frequency of micturition: Secondary | ICD-10-CM | POA: Diagnosis not present

## 2013-03-05 DIAGNOSIS — R059 Cough, unspecified: Secondary | ICD-10-CM | POA: Diagnosis not present

## 2013-03-05 DIAGNOSIS — C50919 Malignant neoplasm of unspecified site of unspecified female breast: Secondary | ICD-10-CM | POA: Diagnosis not present

## 2013-03-05 DIAGNOSIS — N390 Urinary tract infection, site not specified: Secondary | ICD-10-CM | POA: Diagnosis not present

## 2013-03-05 DIAGNOSIS — Z5111 Encounter for antineoplastic chemotherapy: Secondary | ICD-10-CM | POA: Diagnosis not present

## 2013-03-05 DIAGNOSIS — D649 Anemia, unspecified: Secondary | ICD-10-CM

## 2013-03-05 DIAGNOSIS — T451X5A Adverse effect of antineoplastic and immunosuppressive drugs, initial encounter: Secondary | ICD-10-CM | POA: Insufficient documentation

## 2013-03-05 DIAGNOSIS — G609 Hereditary and idiopathic neuropathy, unspecified: Secondary | ICD-10-CM | POA: Diagnosis not present

## 2013-03-05 DIAGNOSIS — G620 Drug-induced polyneuropathy: Secondary | ICD-10-CM | POA: Insufficient documentation

## 2013-03-05 DIAGNOSIS — R319 Hematuria, unspecified: Secondary | ICD-10-CM | POA: Diagnosis not present

## 2013-03-05 DIAGNOSIS — R0602 Shortness of breath: Secondary | ICD-10-CM | POA: Diagnosis not present

## 2013-03-05 DIAGNOSIS — M25579 Pain in unspecified ankle and joints of unspecified foot: Secondary | ICD-10-CM | POA: Diagnosis not present

## 2013-03-05 DIAGNOSIS — C519 Malignant neoplasm of vulva, unspecified: Secondary | ICD-10-CM

## 2013-03-05 DIAGNOSIS — R110 Nausea: Secondary | ICD-10-CM | POA: Diagnosis not present

## 2013-03-05 DIAGNOSIS — E785 Hyperlipidemia, unspecified: Secondary | ICD-10-CM | POA: Diagnosis not present

## 2013-03-05 DIAGNOSIS — C50419 Malignant neoplasm of upper-outer quadrant of unspecified female breast: Secondary | ICD-10-CM | POA: Diagnosis not present

## 2013-03-05 DIAGNOSIS — Z5189 Encounter for other specified aftercare: Secondary | ICD-10-CM | POA: Diagnosis not present

## 2013-03-05 DIAGNOSIS — I10 Essential (primary) hypertension: Secondary | ICD-10-CM | POA: Diagnosis not present

## 2013-03-05 DIAGNOSIS — Z171 Estrogen receptor negative status [ER-]: Secondary | ICD-10-CM | POA: Diagnosis not present

## 2013-03-05 DIAGNOSIS — E860 Dehydration: Secondary | ICD-10-CM | POA: Diagnosis not present

## 2013-03-05 DIAGNOSIS — R05 Cough: Secondary | ICD-10-CM | POA: Diagnosis not present

## 2013-03-05 DIAGNOSIS — R5381 Other malaise: Secondary | ICD-10-CM | POA: Diagnosis not present

## 2013-03-05 LAB — CBC WITH DIFFERENTIAL/PLATELET
BASO%: 1.4 % (ref 0.0–2.0)
Basophils Absolute: 0 10*3/uL (ref 0.0–0.1)
EOS%: 6.2 % (ref 0.0–7.0)
Eosinophils Absolute: 0.2 10*3/uL (ref 0.0–0.5)
HCT: 35 % (ref 34.8–46.6)
HGB: 11.2 g/dL — ABNORMAL LOW (ref 11.6–15.9)
LYMPH%: 24.1 % (ref 14.0–49.7)
MCH: 28.5 pg (ref 25.1–34.0)
MCHC: 32 g/dL (ref 31.5–36.0)
MCV: 89 fL (ref 79.5–101.0)
MONO#: 0.5 10*3/uL (ref 0.1–0.9)
MONO%: 14.9 % — ABNORMAL HIGH (ref 0.0–14.0)
NEUT#: 1.6 10*3/uL (ref 1.5–6.5)
NEUT%: 53.4 % (ref 38.4–76.8)
Platelets: 161 10*3/uL (ref 145–400)
RBC: 3.93 10*6/uL (ref 3.70–5.45)
RDW: 15.7 % — ABNORMAL HIGH (ref 11.2–14.5)
WBC: 3 10*3/uL — ABNORMAL LOW (ref 3.9–10.3)
lymph#: 0.7 10*3/uL — ABNORMAL LOW (ref 0.9–3.3)

## 2013-03-05 LAB — COMPREHENSIVE METABOLIC PANEL (CC13)
ALT: 13 U/L (ref 0–55)
AST: 20 U/L (ref 5–34)
Albumin: 3.1 g/dL — ABNORMAL LOW (ref 3.5–5.0)
Alkaline Phosphatase: 60 U/L (ref 40–150)
Anion Gap: 10 mEq/L (ref 3–11)
BUN: 19.1 mg/dL (ref 7.0–26.0)
CO2: 26 mEq/L (ref 22–29)
Calcium: 9.3 mg/dL (ref 8.4–10.4)
Chloride: 105 mEq/L (ref 98–109)
Creatinine: 1.3 mg/dL — ABNORMAL HIGH (ref 0.6–1.1)
Glucose: 118 mg/dl (ref 70–140)
Potassium: 3.3 mEq/L — ABNORMAL LOW (ref 3.5–5.1)
Sodium: 140 mEq/L (ref 136–145)
Total Bilirubin: 0.32 mg/dL (ref 0.20–1.20)
Total Protein: 7.3 g/dL (ref 6.4–8.3)

## 2013-03-05 MED ORDER — GABAPENTIN 100 MG PO CAPS: ORAL_CAPSULE | ORAL | Status: DC

## 2013-03-05 NOTE — Telephone Encounter (Signed)
appts made and printed...td 

## 2013-03-05 NOTE — Progress Notes (Signed)
ID: Diane [name]   DOB: November 19, 1940  MR#: [id number]  CSN#:[id number]  PCP: Diane Ser, MD GYN:  SU: Diane [name] OTHER MD: Diane [name], Diane [name], Diane [name]  CHIEF COMPLAINT:  Right Breast Cancer   HISTORY OF PRESENT ILLNESS: Diane had routine screening mammography at [facility] hospital 08/26/2012 showing a possible distortion in the right breast. Additional views of the breast Center 08/29/2012 confirmed a spiculated mass in the right breast measuring approximately 9 mm. This was not palpable. Ultrasound confirmed an irregular mass in the area in question, again measuring 9 mm. The right axilla was benign.  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 The patient's subsequent history is as detailed below.   INTERVAL HISTORY: Diane returns today for followup of her right breast carcinoma. She completed her adjuvant chemotherapy in August, and is currently receiving active radiation therapy under the care of Diane [name]. She has tolerated radiation well with the exception of some mild skin changes and mild fatigue. Overall, she finds radiation to be much easier than the chemotherapy.  She'll be completing radiation next week.   Diane [name]'s energy level has improved, as has her state of mind. She is less anxious today. She tells me things are "getting back to normal at home".  Her biggest complaint today is continued peripheral neuropathy. This was pre-existing secondary to a history of diabetes, but it has worsened slightly since completing chemotherapy. It is affecting both her fingertips and her toes, and bothers her both during the day and at night.  REVIEW OF SYSTEMS: Diane has had no fevers. She denies any problems with hot flashes. She's eating and drinking well, and currently denies any nausea, diarrhea, or constipation. She's had no increased cough, shortness  of breath, or chest pain. She occasionally has some mild palpitations. These are stable. She's had no abnormal headaches, dizziness, or change in vision. She has some joint pain she attributes to arthritis, and this is also stable. She denies any additional pain elsewhere, and has had no peripheral swelling.   A detailed review of systems is otherwise noncontributory.   PAST MEDICAL HISTORY: Past Medical History  Diagnosis Date  . Lower back pain   . Herniated disc   . Arthritis   . Lumbar stenosis     L4-5  . Spondylolysis   . Coronary artery disease   . Hypertension   . Hyperlipemia   . 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 Breast cancer   . Depression   . Dysrhythmia     afib  . Shortness of breath     exertion  . Anxiety   . Diabetes mellitus without complication     diet  . Headache(784.0)   . Status post chemotherapy 11/04/2012 - 01/06/2013.    Docetaxel/Cytoxan Q 3 Weeks x 4 cycles    PAST SURGICAL HISTORY: Past Surgical History  Procedure Laterality Date  . Wide local excision  01/2010    Bilateral groin excisions, re-excision of vulva  . Posterior lumbar arthrodesis, pedicle screw fixation, posterolateral arthodesis  03/17/11  . Tonsillectomy    . Abdominal hysterectomy    . Dilation and curettage of uterus    . Tubal ligation    . Colonoscopy    . Shoulder arthroscopy with rotator cuff repair and subacromial decompression  06/04/2012    Procedure: SHOULDER ARTHROSCOPY WITH ROTATOR CUFF REPAIR AND SUBACROMIAL DECOMPRESSION;  Surgeon: Diane Sells, MD;  Location: [facility];  Service: Orthopedics;  Laterality: Left;  LEFT SHOULDER ARTHROSCOPY WITH ROTATOR CUFF REPAIR AND SUBACROMIAL DECOMPRESSION AND DISTAL CLAVICLE RESECTION  . Partial mastectomy with needle localization and axillary sentinel lymph node bx Right 10/08/2012    Procedure: RIGHT PARTIAL MASTECTOMY WITH NEEDLE LOCALIZATION AND RIGHT AXILLARY SENTINEL LYMPH NODE BX;   Surgeon: Diane Hector, MD;  Location: [facility];  Service: General;  Laterality: Right;  Injection of 1% Methylene Blue dye into right breast  . Portacath placement Left 10/08/2012    Procedure: INSERTION PORT-A-CATH WITH ULTRASOUND GUIDANCE;  Surgeon: Diane Hector, MD;  Location: [facility];  Service: General;  Laterality: Left;  Using 65F [place]  . Vulvectomy partial      FAMILY HISTORY Family History  Problem Relation Age of Onset  . Stroke Mother   . Alzheimer's disease Mother   . Heart attack Father   . Heart attack Brother    the patient's father died from a heart attack at the age of 29. The patient's mother died at age 27 in the setting of Alzheimer's disease. Diane had 4 brothers, 3 sisters. There is no history of breast or ovarian cancer in the family to her knowledge.  GYNECOLOGIC HISTORY: Menarche age 41, first live birth age 16, she is [place] P3. She had a total abdominal hysterectomy with bilateral salpingo-oophorectomy in 1988. She took estrogen for approximately 30 years, stopping in January 2013.  SOCIAL HISTORY: (updated 03/05/2013) Diane [name] used to work for [employer]. When she retired she moved to [facility], which is for her husband was from. He died approximately 11 years ago. The patient currently lives alone. Her daughter Diane [name] "hovers over me". Diane [name] works at [employer] on [employer]. The other 2 children are daughter Diane [name], who lives in [place] and works in a hospital, and Diane [name], who lives in [place] and is a housewife. The patient has 5 grandchildren. She is a Furniture conservator/restorer.  ADVANCED DIRECTIVES: Not in place  HEALTH MAINTENANCE: (updated 03/05/2013) History  Substance Use Topics  . Smoking status: Former Smoker -- 50 years    Types: Cigarettes    Quit date: 05/29/2009  . Smokeless tobacco: Never Used  . Alcohol Use: No     Colonoscopy:  PAP: s/p TAH/BSO in 1988  Bone  density: June 2011 at [facility] hospital; normal  Lipid panel: Diane [name]   Allergies  Allergen Reactions  . Shellfish Allergy Swelling    Current Outpatient Prescriptions  Medication Sig Dispense Refill  . dabigatran (PRADAXA) 75 MG CAPS Take 75 mg by mouth every 12 (twelve) hours.      . ferrous sulfate 325 (65 FE) MG tablet Take 325 mg by mouth daily with breakfast.      . FLUoxetine (PROZAC) 10 MG capsule Take 10 mg by mouth daily.      [name] Kitchen gabapentin (NEURONTIN) 100 MG capsule 1 tab by mouth in morning, 1 tab by mouth mid-day, and 3 tabs by mouth at bedtime  150 capsule  3  . guaiFENesin-codeine 100-10 MG/5ML syrup Take 5 mLs by mouth 3 (three) times daily as needed for cough. May take 78mL at a time if necessary.  180 mL  1  . KLOR-CON M20 20 MEQ tablet       . nebivolol (BYSTOLIC) 5 MG tablet Take 5 mg by mouth daily.      [name] Kitchen NITROSTAT 0.4  MG SL tablet       . pravastatin (PRAVACHOL) 40 MG tablet Take 40 mg by mouth every evening.      . traZODone (DESYREL) 50 MG tablet Take 50 mg by mouth at bedtime.      . triamterene-hydrochlorothiazide (DYAZIDE) 37.5-25 MG per capsule       . cholestyramine (QUESTRAN) 4 G packet Take 1 packet by mouth 2 (two) times daily with a meal. For diarrhea  60 each  12  . HYDROcodone-acetaminophen (NORCO/VICODIN) 5-325 MG per tablet Take 1-2 tablets by mouth every 4 (four) hours as needed for pain.  40 tablet  1  . LORazepam (ATIVAN) 0.5 MG tablet Take 1 tablet (0.5 mg total) by mouth at bedtime as needed for anxiety (Nausea or vomiting).  30 tablet  0   No current facility-administered medications for this visit.    OBJECTIVE: Middle-aged [place] American woman who appears comfortable and is in no acute distress Filed Vitals:   03/05/13 1158  BP: 100/58  Pulse: 112  Temp: 97.9 F (36.6 C)  Resp: 18     Body mass index is 31.4 kg/(m^2).    ECOG FS: 1 Filed Weights   03/05/13 1158  Weight: 200 lb 8 oz (90.946 kg)   Physical Exam: HEENT:  Sclerae  anicteric.  Oropharynx clear. No candidiasis noted buccal mucosa is pink and moist. NODES:  No cervical or supraclavicular lymphadenopathy palpated.  BREAST EXAM:  Deferred. Axillae are benign bilaterally, no palpable adenopathy LUNGS:  Clear to auscultation bilaterally.  No wheezes or rhonchi HEART:  Regular rate and rhythm. No murmur appreciated ABDOMEN:  Soft, obese, nontender.  Positive bowel sounds.  MSK:  No focal spinal tenderness to palpation. Full range of motion in the upper extremities. EXTREMITIES:  No peripheral edema.   SKIN:  Port is intact in the left upper chest wall with no erythema, edema, or evidence of infection. NEURO:  Nonfocal. Well oriented.  Positive affect.    LAB RESULTS: Lab Results  Component Value Date   WBC 3.0* 03/05/2013   NEUTROABS 1.6 03/05/2013   HGB 11.2* 03/05/2013   HCT 35.0 03/05/2013   MCV 89.0 03/05/2013   PLT 161 03/05/2013      Chemistry      Component Value Date/Time   NA 140 03/05/2013 1150   NA 139 10/03/2012 1441   K 3.3* 03/05/2013 1150   K 3.2* 10/03/2012 1441   CL 105 11/04/2012 0937   CL 101 10/03/2012 1441   CO2 26 03/05/2013 1150   CO2 27 10/03/2012 1441   BUN 19.1 03/05/2013 1150   BUN 19 10/03/2012 1441   CREATININE 1.3* 03/05/2013 1150   CREATININE 1.36* 10/03/2012 1441      Component Value Date/Time   CALCIUM 9.3 03/05/2013 1150   CALCIUM 9.2 10/03/2012 1441   ALKPHOS 60 03/05/2013 1150   ALKPHOS 65 10/03/2012 1441   AST 20 03/05/2013 1150   AST 22 10/03/2012 1441   ALT 13 03/05/2013 1150   ALT 9 10/03/2012 1441   BILITOT 0.32 03/05/2013 1150   BILITOT 0.2* 10/03/2012 1441       STUDIES:  No results found.    ASSESSMENT: 72 y.o. [place] woman   (1) s/p Rigth central breast biopsy 08/30/2012 for a clinical T1b No, stage IA invasive ductal carcinoma, grade 3, triple negative, with an MIB-1 of 70%. The largest extent of tumor on the initial biopsy was 0.4 cm  (2)  S/p right lumpectomy under the care of  Dr. Dalbert Batman on 10/08/2012 for a  pT1a, pN0, grade 3 invasive ductal carcinoma, with largest residual invasive focus measuring 0.1 cm in greatest dimension. There was no lymphovascular invasion, with 0 of one lymph node involved. Margins were clear.  (3)  status post adjuvant chemotherapy consisting of 4 q. three-week doses of docetaxel/Cytoxan, first dose on 11/04/2012, and last dose on 01/06/2013.    (4)  radiation therapy, completed October 2014  (5) remote history of Stage IB vulva cancer, followed by Dr. Rogelio Seen  PLAN: Diane will complete her radiation therapy as scheduled next week. She will see Dr. Dalbert Batman later this month to have her port removed. We will then initiate routine followup, and she will return to see [place] in 3 months, January 2015, for labs and physical exam.   I am prescribing gabapentin in the hopes that this will help decrease her peripheral neuropathy. She will start out with 200 mg nightly for 1 week, then increase to 300 mg nightly. We will eventually add 2 additional doses during the day, 100 mg at breakfast, and 100 mg midday (for total of 500 mg daily). Again, this increase will be made gradually.  She'll continue to see Dr. Rogelio Seen approximately every 6 months for her history of vulvar cancer, with the next appointment being due approximately April of next year. All this was reviewed in detail with Diane today. She voices understanding and agreement with our plan, and will call any changes or problems prior to her next appointment.    BERRY,AMY    03/05/2013

## 2013-03-06 ENCOUNTER — Ambulatory Visit
Admission: RE | Admit: 2013-03-06 | Discharge: 2013-03-06 | Disposition: A | Discharge status: Home / Self Care | Payer: Medicare Other | Source: Ambulatory Visit | Attending: Radiation Oncology | Admitting: Radiation Oncology

## 2013-03-06 ENCOUNTER — Encounter: Payer: Self-pay | Admitting: Radiation Oncology

## 2013-03-06 DIAGNOSIS — R0602 Shortness of breath: Secondary | ICD-10-CM | POA: Diagnosis not present

## 2013-03-06 DIAGNOSIS — I10 Essential (primary) hypertension: Secondary | ICD-10-CM | POA: Diagnosis not present

## 2013-03-06 DIAGNOSIS — C50919 Malignant neoplasm of unspecified site of unspecified female breast: Secondary | ICD-10-CM | POA: Diagnosis not present

## 2013-03-06 DIAGNOSIS — M7989 Other specified soft tissue disorders: Secondary | ICD-10-CM | POA: Diagnosis not present

## 2013-03-06 DIAGNOSIS — Z510 Encounter for antineoplastic radiation therapy: Secondary | ICD-10-CM | POA: Diagnosis not present

## 2013-03-06 DIAGNOSIS — R059 Cough, unspecified: Secondary | ICD-10-CM | POA: Diagnosis not present

## 2013-03-06 DIAGNOSIS — E7800 Pure hypercholesterolemia, unspecified: Secondary | ICD-10-CM | POA: Diagnosis not present

## 2013-03-06 DIAGNOSIS — R05 Cough: Secondary | ICD-10-CM | POA: Diagnosis not present

## 2013-03-06 DIAGNOSIS — M25579 Pain in unspecified ankle and joints of unspecified foot: Secondary | ICD-10-CM | POA: Diagnosis not present

## 2013-03-07 ENCOUNTER — Ambulatory Visit: Payer: Medicare Other

## 2013-03-10 ENCOUNTER — Ambulatory Visit: Payer: Medicare Other

## 2013-03-10 ENCOUNTER — Ambulatory Visit: Admission: RE | Admit: 2013-03-10 | Payer: Medicare Other | Source: Ambulatory Visit | Admitting: Radiation Oncology

## 2013-03-10 NOTE — Progress Notes (Signed)
  Radiation Oncology         [phone] ________________________________  Name: Diane [name] MRN: [id number]  Date: 03/06/2013  DOB: 1941-03-16  End of Treatment Note  Diagnosis:    pT1aN0M0 Invasive Ductal Carcinoma, Grade III triple Negative, right breast   Indication for treatment:  curative       Radiation treatment dates:  01/23/2013-03/06/2013  Site/dose:   1) Right breast / 46 Gy in 23 fractions 2) Right breast boost / 14 Gy in 7 fractions  Beams/energy:  1) opposed tangents / 10 MV photons 2) 2 field photon plan / 10 MV photons  Narrative: The patient tolerated radiation treatment relatively well with skin hyperpigmentation and dry desquamation.  Plan: The patient has completed radiation treatment. The patient will return to radiation oncology clinic for routine followup in 3-4 weeks. I advised them to call or return sooner if they have any questions or concerns related to their recovery or treatment.  -----------------------------------  Eppie Gibson, MD

## 2013-03-19 ENCOUNTER — Ambulatory Visit: Payer: Medicare Other | Admitting: Oncology

## 2013-03-19 ENCOUNTER — Other Ambulatory Visit: Payer: Medicare Other | Admitting: Lab

## 2013-03-21 NOTE — Progress Notes (Signed)
Photon Boost Complex Emergency planning/management officer Note  Diagnosis: Right Breast Cancer Outpatient   The patient's CT images from her  simulation were reviewed to plan her boost treatment to her right breast  lumpectomy cavity.  The boost to the lumpectomy cavity will be delivered with 2 photon fields using MLCs for custom blocks with 10 MV photon energy. 14 Gy in 7 fractions prescribed.  -----------------------------------  Eppie Gibson, MD

## 2013-03-24 ENCOUNTER — Ambulatory Visit (INDEPENDENT_AMBULATORY_CARE_PROVIDER_SITE_OTHER): Payer: Medicare Other | Admitting: General Surgery

## 2013-03-24 ENCOUNTER — Encounter (INDEPENDENT_AMBULATORY_CARE_PROVIDER_SITE_OTHER): Payer: Self-pay | Admitting: General Surgery

## 2013-03-24 VITALS — BP 132/78 | HR 66 | Temp 97.6°F | Resp 16 | Ht 67.5 in | Wt 198.4 lb

## 2013-03-24 DIAGNOSIS — C50119 Malignant neoplasm of central portion of unspecified female breast: Secondary | ICD-10-CM

## 2013-03-24 DIAGNOSIS — C50111 Malignant neoplasm of central portion of right female breast: Secondary | ICD-10-CM

## 2013-03-24 NOTE — Patient Instructions (Signed)
Your port was removed in the office today without any complications.  Keep an ice pack on this tonight, intermittently.  You may take a shower, starting tomorrow.  You may drive your car when you are completely comfortable doing so  Be sure to get your mammograms in March of 2015.  Return to see Dr. Dalbert Batman in April 2015.

## 2013-03-24 NOTE — Progress Notes (Signed)
Patient ID: Diane [name], female   DOB: [birth year], 72 y.o.   MRN: [id number] History:  This patient underwent right partial mastectomy and sentinel lymph node biopsy and Port-A-Cath insertion on 10/08/2012. She had a triple negative breast cancer, invasive ductal, T1a, N0. She has completed her chemotherapy. She has completed her radiation therapy now. She is followed by Dr. Alycia Rossetti for vulvar cancer. She would like the Port-A-Cath removed, And he is here today to have that procedure performed  Exam: Patient is alert. Family member with her. Procedure note. Placed supine in the OR. Left upper chest prepped and draped with Betadine. 1% Xylocaine with epinephrine local anesthesia. Incision made left infraclavicular area. Port  dissected away from subcutaneous tic tissue. All 3 Prolene sutures cut and removed. Port and catheter removed intact. No bleeding. Subcutaneous tissue closed with 3-0 Vicryl and skin closed with subcuticular should were Monocryl and Dermabond. Tolerated very well.   Assessment:  Uneventful Port-A-Cath removal in office today Invasive, triple negative breast cancer right breast, central upper outer quadrant, T1a,., N0.  No evidence of recurrence 6 months postop right partial mastectomy, sentinel node biopsy, and Port-A-Cath insertion  Status post adjuvant chemotherapy and adjuvant radiation therapy.  Plan: Next bilateral mammograms due in March 2015 See me in April 2015 Continue medical oncology followup with Dr. Lenise Arena M. Dalbert Batman, M.D., [facility] General and Minimally invasive Surgery Breast and Colorectal Surgery Office:   [phone] Pager:   [phone]

## 2013-03-27 ENCOUNTER — Telehealth: Payer: Self-pay | Admitting: *Deleted

## 2013-03-27 ENCOUNTER — Encounter: Payer: Self-pay | Admitting: Radiation Oncology

## 2013-03-27 NOTE — Telephone Encounter (Signed)
Ms. Drawhorn called to report that she is experiencing rectal bleeding.  When asked, she described it as a "very light period" and is wearing a mini pad.  Encouraged her to call her medical physician's office and ask to speak to him or the on-call physician tonight.  She is currently on Pradaxa, so advised her to inform her physician that she is taking this medication to get instructions on future use.  Will call and check her status in the am.  She is scheduled for a FU appointment s/p radiation for breast cancer with Dr. Isidore Moos on tomorrow.

## 2013-03-28 ENCOUNTER — Ambulatory Visit
Admission: RE | Admit: 2013-03-28 | Discharge: 2013-03-28 | Disposition: A | Discharge status: Home / Self Care | Payer: Medicare Other | Source: Ambulatory Visit | Attending: Radiation Oncology | Admitting: Radiation Oncology

## 2013-03-28 ENCOUNTER — Encounter: Payer: Self-pay | Admitting: Radiation Oncology

## 2013-03-28 VITALS — BP 118/65 | HR 42 | Temp 97.6°F | Ht 67.5 in | Wt 203.1 lb

## 2013-03-28 DIAGNOSIS — C50111 Malignant neoplasm of central portion of right female breast: Secondary | ICD-10-CM

## 2013-03-28 HISTORY — DX: Personal history of irradiation: Z92.3

## 2013-03-28 NOTE — Progress Notes (Signed)
Ms. Durnil is here today for a fu assessment s/p radiation to her right breast.  She denies any pain nor fatigue. Hyperpigmentation noted in the tx field but the skin remains intact.

## 2013-03-28 NOTE — Progress Notes (Signed)
Radiation Oncology         [phone] _______________________________  Name: Diane [name] MRN: [id number]  Date: 03/28/2013  DOB: 04-Nov-1940  Follow-Up Visit Note  Outpatient  CC: Myrtis Ser, MD  Deatra Robinson, MD  Diagnosis and Prior Radiotherapy:   pT1aN0M0 Invasive Ductal Carcinoma, Grade III triple Negative, right breast  Indication for treatment: curative  Radiation treatment dates: 01/23/2013-03/06/2013  Site/dose: 1) Right breast / 46 Gy in 23 fractions  2) Right breast boost / 14 Gy in 7 fractions  Narrative:  The patient returns today for routine follow-up.  She denies any pain nor fatigue. Skin is healing and she is applying Vit E oil BID                             ALLERGIES:  is allergic to shellfish allergy.  Meds: Current Outpatient Prescriptions  Medication Sig Dispense Refill  . cholestyramine (QUESTRAN) 4 G packet Take 1 packet by mouth 2 (two) times daily with a meal. For diarrhea  60 each  12  . dabigatran (PRADAXA) 75 MG CAPS Take 75 mg by mouth every 12 (twelve) hours.      . ferrous sulfate 325 (65 FE) MG tablet Take 325 mg by mouth daily with breakfast.      . FLUoxetine (PROZAC) 10 MG capsule Take 10 mg by mouth daily.      [name] Kitchen gabapentin (NEURONTIN) 100 MG capsule 1 tab by mouth in morning, 1 tab by mouth mid-day, and 3 tabs by mouth at bedtime  150 capsule  3  . guaiFENesin-codeine 100-10 MG/5ML syrup Take 5 mLs by mouth 3 (three) times daily as needed for cough. May take 26mL at a time if necessary.  180 mL  1  . HYDROcodone-acetaminophen (NORCO/VICODIN) 5-325 MG per tablet Take 1-2 tablets by mouth every 4 (four) hours as needed for pain.  40 tablet  1  . KLOR-CON M20 20 MEQ tablet       . LORazepam (ATIVAN) 0.5 MG tablet Take 1 tablet (0.5 mg total) by mouth at bedtime as needed for anxiety (Nausea or vomiting).  30 tablet  0  . nebivolol (BYSTOLIC) 5 MG tablet Take 5 mg by mouth daily.      [name] Kitchen NITROSTAT 0.4 MG SL tablet       .  pravastatin (PRAVACHOL) 40 MG tablet Take 40 mg by mouth every evening.      . traZODone (DESYREL) 50 MG tablet Take 50 mg by mouth at bedtime.      . triamterene-hydrochlorothiazide (DYAZIDE) 37.5-25 MG per capsule        No current facility-administered medications for this encounter.    Physical Findings: The patient is in no acute distress. Patient is alert and oriented.  height is 5' 7.5" (1.715 m) and weight is 203 lb 1.6 oz (92.126 kg). Her temperature is 97.6 F (36.4 C). Her blood pressure is 118/65 and her pulse is 42. .   Right breast has resolving dryness and patchy hyperpigmentation, inframammary fold is hypopigmented   Lab Findings: Lab Results  Component Value Date   WBC 3.0* 03/05/2013   HGB 11.2* 03/05/2013   HCT 35.0 03/05/2013   MCV 89.0 03/05/2013   PLT 161 03/05/2013    Radiographic Findings: No results found.  Impression/Plan: Healing from RT well. I encouraged her to continue with yearly mammography and followup with medical oncology. I will see her back on an as-needed basis.  I have encouraged her to call if she has any issues or concerns in the future. I wished her the very best. Continue Vit E oil for at least 2-3 more months for healing.  I spent 15 minutes face to face with the patient and more than 50% of that time was spent in counseling and/or coordination of care. _____________________________________   Eppie Gibson, MD

## 2013-04-07 DIAGNOSIS — I10 Essential (primary) hypertension: Secondary | ICD-10-CM | POA: Diagnosis not present

## 2013-04-07 DIAGNOSIS — E119 Type 2 diabetes mellitus without complications: Secondary | ICD-10-CM | POA: Diagnosis not present

## 2013-04-07 DIAGNOSIS — E7800 Pure hypercholesterolemia, unspecified: Secondary | ICD-10-CM | POA: Diagnosis not present

## 2013-04-09 DIAGNOSIS — M75120 Complete rotator cuff tear or rupture of unspecified shoulder, not specified as traumatic: Secondary | ICD-10-CM | POA: Diagnosis not present

## 2013-04-11 DIAGNOSIS — I10 Essential (primary) hypertension: Secondary | ICD-10-CM | POA: Diagnosis not present

## 2013-04-11 DIAGNOSIS — Z1331 Encounter for screening for depression: Secondary | ICD-10-CM | POA: Diagnosis not present

## 2013-04-11 DIAGNOSIS — Z0000 Encounter for general adult medical examination without abnormal findings: Secondary | ICD-10-CM | POA: Diagnosis not present

## 2013-04-11 DIAGNOSIS — Z23 Encounter for immunization: Secondary | ICD-10-CM | POA: Diagnosis not present

## 2013-04-25 DIAGNOSIS — M19019 Primary osteoarthritis, unspecified shoulder: Secondary | ICD-10-CM | POA: Diagnosis not present

## 2013-04-29 ENCOUNTER — Ambulatory Visit (INDEPENDENT_AMBULATORY_CARE_PROVIDER_SITE_OTHER): Payer: Medicare Other

## 2013-04-29 VITALS — BP 132/65 | HR 47 | Resp 20 | Ht 67.5 in | Wt 201.0 lb

## 2013-04-29 DIAGNOSIS — L608 Other nail disorders: Secondary | ICD-10-CM

## 2013-04-29 DIAGNOSIS — E1149 Type 2 diabetes mellitus with other diabetic neurological complication: Secondary | ICD-10-CM | POA: Diagnosis not present

## 2013-04-29 DIAGNOSIS — E1142 Type 2 diabetes mellitus with diabetic polyneuropathy: Secondary | ICD-10-CM

## 2013-04-29 DIAGNOSIS — E1140 Type 2 diabetes mellitus with diabetic neuropathy, unspecified: Secondary | ICD-10-CM

## 2013-04-29 NOTE — Progress Notes (Signed)
   Subjective:    Patient ID: Diane [name], female    DOB: 1940/12/10, 72 y.o.   MRN: [id number] "He's doing my feet, my nails.  They're hurting right now."  HPI    Review of Systems different at this time     Objective:   Physical Exam Pedal pulses palpable to follow DP and PT plus one over 4 bilateral Refill time 3 seconds all digits. Epicritic and proprioceptive sensations diminished on Semmes Weinstein testing to forefoot there is history of cold sensation paresthesia lesion burning both to diabetic neuropathy as well is neuropathy due to chemotherapy. Patient does have thick friable criptotic nails incurvated and friable 1 through 5 bilateral for debridement at this time. No open wounds ulcerations or other new problems orthopedic and skin problems no other changes noted       Assessment & Plan:  Diabetes with neuropathy. Paresthesias noted as well. Dystrophic nails debrided x10 the presence of diabetes and cocking factors return for followup in 3 months.  [name]

## 2013-04-29 NOTE — Patient Instructions (Signed)
Diabetes and Foot Care Diabetes may cause you to have problems because of poor blood supply (circulation) to your feet and legs. This may cause the skin on your feet to become thinner, break easier, and heal more slowly. Your skin may become dry, and the skin may peel and crack. You may also have nerve damage in your legs and feet causing decreased feeling in them. You may not notice minor injuries to your feet that could lead to infections or more serious problems. Taking care of your feet is one of the most important things you can do for yourself.  HOME CARE INSTRUCTIONS  Wear shoes at all times, even in the house. Do not go barefoot. Bare feet are easily injured.  Check your feet daily for blisters, cuts, and redness. If you cannot see the bottom of your feet, use a mirror or ask someone for help.  Wash your feet with warm water (do not use hot water) and mild soap. Then pat your feet and the areas between your toes until they are completely dry. Do not soak your feet as this can dry your skin.  Apply a moisturizing lotion or petroleum jelly (that does not contain alcohol and is unscented) to the skin on your feet and to dry, brittle toenails. Do not apply lotion between your toes.  Trim your toenails straight across. Do not dig under them or around the cuticle. File the edges of your nails with an emery board or nail file.  Do not cut corns or calluses or try to remove them with medicine.  Wear clean socks or stockings every day. Make sure they are not too tight. Do not wear knee-high stockings since they may decrease blood flow to your legs.  Wear shoes that fit properly and have enough cushioning. To break in new shoes, wear them for just a few hours a day. This prevents you from injuring your feet. Always look in your shoes before you put them on to be sure there are no objects inside.  Do not cross your legs. This may decrease the blood flow to your feet.  If you find a minor scrape,  cut, or break in the skin on your feet, keep it and the skin around it clean and dry. These areas may be cleansed with mild soap and water. Do not cleanse the area with peroxide, alcohol, or iodine.  When you remove an adhesive bandage, be sure not to damage the skin around it.  If you have a wound, look at it several times a day to make sure it is healing.  Do not use heating pads or hot water bottles. They may burn your skin. If you have lost feeling in your feet or legs, you may not know it is happening until it is too late.  Make sure your health care provider performs a complete foot exam at least annually or more often if you have foot problems. Report any cuts, sores, or bruises to your health care provider immediately. SEEK MEDICAL CARE IF:   You have an injury that is not healing.  You have cuts or breaks in the skin.  You have an ingrown nail.  You notice redness on your legs or feet.  You feel burning or tingling in your legs or feet.  You have pain or cramps in your legs and feet.  Your legs or feet are numb.  Your feet always feel cold. SEEK IMMEDIATE MEDICAL CARE IF:   There is increasing redness,   swelling, or pain in or around a wound.  There is a red line that goes up your leg.  Pus is coming from a wound.  You develop a fever or as directed by your health care provider.  You notice a bad smell coming from an ulcer or wound. Document Released: 05/12/2000 Document Revised: 01/15/2013 Document Reviewed: 10/22/2012 ExitCare Patient Information 2014 [employer].  

## 2013-05-02 DIAGNOSIS — M25519 Pain in unspecified shoulder: Secondary | ICD-10-CM | POA: Diagnosis not present

## 2013-05-12 DIAGNOSIS — M25519 Pain in unspecified shoulder: Secondary | ICD-10-CM | POA: Diagnosis not present

## 2013-06-03 DIAGNOSIS — M25519 Pain in unspecified shoulder: Secondary | ICD-10-CM | POA: Diagnosis not present

## 2013-06-03 DIAGNOSIS — M75120 Complete rotator cuff tear or rupture of unspecified shoulder, not specified as traumatic: Secondary | ICD-10-CM | POA: Diagnosis not present

## 2013-06-05 ENCOUNTER — Other Ambulatory Visit (HOSPITAL_BASED_OUTPATIENT_CLINIC_OR_DEPARTMENT_OTHER): Payer: Medicare Other

## 2013-06-05 ENCOUNTER — Other Ambulatory Visit: Payer: Self-pay | Admitting: *Deleted

## 2013-06-05 DIAGNOSIS — C50419 Malignant neoplasm of upper-outer quadrant of unspecified female breast: Secondary | ICD-10-CM | POA: Diagnosis not present

## 2013-06-05 DIAGNOSIS — N390 Urinary tract infection, site not specified: Secondary | ICD-10-CM | POA: Diagnosis not present

## 2013-06-05 DIAGNOSIS — G620 Drug-induced polyneuropathy: Secondary | ICD-10-CM | POA: Diagnosis not present

## 2013-06-05 DIAGNOSIS — C50911 Malignant neoplasm of unspecified site of right female breast: Secondary | ICD-10-CM

## 2013-06-05 DIAGNOSIS — R5381 Other malaise: Secondary | ICD-10-CM | POA: Diagnosis not present

## 2013-06-05 DIAGNOSIS — C50919 Malignant neoplasm of unspecified site of unspecified female breast: Secondary | ICD-10-CM

## 2013-06-05 DIAGNOSIS — R197 Diarrhea, unspecified: Secondary | ICD-10-CM | POA: Diagnosis not present

## 2013-06-05 DIAGNOSIS — E860 Dehydration: Secondary | ICD-10-CM | POA: Diagnosis not present

## 2013-06-05 DIAGNOSIS — E785 Hyperlipidemia, unspecified: Secondary | ICD-10-CM | POA: Diagnosis not present

## 2013-06-05 DIAGNOSIS — R110 Nausea: Secondary | ICD-10-CM | POA: Diagnosis not present

## 2013-06-05 DIAGNOSIS — R0602 Shortness of breath: Secondary | ICD-10-CM | POA: Diagnosis not present

## 2013-06-05 DIAGNOSIS — C519 Malignant neoplasm of vulva, unspecified: Secondary | ICD-10-CM | POA: Diagnosis not present

## 2013-06-05 DIAGNOSIS — Z5111 Encounter for antineoplastic chemotherapy: Secondary | ICD-10-CM | POA: Diagnosis not present

## 2013-06-05 DIAGNOSIS — Z171 Estrogen receptor negative status [ER-]: Secondary | ICD-10-CM | POA: Diagnosis not present

## 2013-06-05 DIAGNOSIS — Z5189 Encounter for other specified aftercare: Secondary | ICD-10-CM | POA: Diagnosis not present

## 2013-06-05 DIAGNOSIS — R350 Frequency of micturition: Secondary | ICD-10-CM | POA: Diagnosis not present

## 2013-06-05 DIAGNOSIS — I10 Essential (primary) hypertension: Secondary | ICD-10-CM | POA: Diagnosis not present

## 2013-06-05 DIAGNOSIS — G609 Hereditary and idiopathic neuropathy, unspecified: Secondary | ICD-10-CM | POA: Diagnosis not present

## 2013-06-05 DIAGNOSIS — Z8544 Personal history of malignant neoplasm of other female genital organs: Secondary | ICD-10-CM | POA: Diagnosis not present

## 2013-06-05 DIAGNOSIS — R319 Hematuria, unspecified: Secondary | ICD-10-CM | POA: Diagnosis not present

## 2013-06-05 DIAGNOSIS — B370 Candidal stomatitis: Secondary | ICD-10-CM | POA: Diagnosis not present

## 2013-06-05 DIAGNOSIS — C50119 Malignant neoplasm of central portion of unspecified female breast: Secondary | ICD-10-CM | POA: Diagnosis not present

## 2013-06-05 LAB — COMPREHENSIVE METABOLIC PANEL (CC13)
ALT: 14 U/L (ref 0–55)
AST: 16 U/L (ref 5–34)
Albumin: 3.4 g/dL — ABNORMAL LOW (ref 3.5–5.0)
Alkaline Phosphatase: 74 U/L (ref 40–150)
Anion Gap: 11 mEq/L (ref 3–11)
BUN: 22.2 mg/dL (ref 7.0–26.0)
CO2: 29 mEq/L (ref 22–29)
Calcium: 9.4 mg/dL (ref 8.4–10.4)
Chloride: 103 mEq/L (ref 98–109)
Creatinine: 1.4 mg/dL — ABNORMAL HIGH (ref 0.6–1.1)
Glucose: 95 mg/dl (ref 70–140)
Potassium: 3.1 mEq/L — ABNORMAL LOW (ref 3.5–5.1)
Sodium: 142 mEq/L (ref 136–145)
Total Bilirubin: 0.3 mg/dL (ref 0.20–1.20)
Total Protein: 7.4 g/dL (ref 6.4–8.3)

## 2013-06-05 LAB — CBC WITH DIFFERENTIAL/PLATELET
BASO%: 1 % (ref 0.0–2.0)
Basophils Absolute: 0 10*3/uL (ref 0.0–0.1)
EOS%: 4.5 % (ref 0.0–7.0)
Eosinophils Absolute: 0.2 10*3/uL (ref 0.0–0.5)
HCT: 38.1 % (ref 34.8–46.6)
HGB: 12.1 g/dL (ref 11.6–15.9)
LYMPH%: 32.8 % (ref 14.0–49.7)
MCH: 27 pg (ref 25.1–34.0)
MCHC: 31.7 g/dL (ref 31.5–36.0)
MCV: 85.2 fL (ref 79.5–101.0)
MONO#: 0.4 10*3/uL (ref 0.1–0.9)
MONO%: 11.6 % (ref 0.0–14.0)
NEUT#: 1.9 10*3/uL (ref 1.5–6.5)
NEUT%: 50.1 % (ref 38.4–76.8)
Platelets: 179 10*3/uL (ref 145–400)
RBC: 4.47 10*6/uL (ref 3.70–5.45)
RDW: 14.2 % (ref 11.2–14.5)
WBC: 3.9 10*3/uL (ref 3.9–10.3)
lymph#: 1.3 10*3/uL (ref 0.9–3.3)

## 2013-06-05 MED ORDER — POTASSIUM CHLORIDE CRYS ER 20 MEQ PO TBCR: 20.0000 meq | EXTENDED_RELEASE_TABLET | Freq: Every day | ORAL | Status: DC

## 2013-06-06 DIAGNOSIS — M25519 Pain in unspecified shoulder: Secondary | ICD-10-CM | POA: Diagnosis not present

## 2013-06-06 DIAGNOSIS — M75120 Complete rotator cuff tear or rupture of unspecified shoulder, not specified as traumatic: Secondary | ICD-10-CM | POA: Diagnosis not present

## 2013-06-10 DIAGNOSIS — M25519 Pain in unspecified shoulder: Secondary | ICD-10-CM | POA: Diagnosis not present

## 2013-06-10 DIAGNOSIS — M75120 Complete rotator cuff tear or rupture of unspecified shoulder, not specified as traumatic: Secondary | ICD-10-CM | POA: Diagnosis not present

## 2013-06-12 ENCOUNTER — Ambulatory Visit (HOSPITAL_BASED_OUTPATIENT_CLINIC_OR_DEPARTMENT_OTHER): Payer: Medicare Other | Admitting: Oncology

## 2013-06-12 VITALS — BP 135/81 | HR 46 | Temp 97.5°F | Resp 18 | Ht 67.5 in | Wt 199.6 lb

## 2013-06-12 DIAGNOSIS — G620 Drug-induced polyneuropathy: Secondary | ICD-10-CM | POA: Diagnosis not present

## 2013-06-12 DIAGNOSIS — E860 Dehydration: Secondary | ICD-10-CM | POA: Diagnosis not present

## 2013-06-12 DIAGNOSIS — C50119 Malignant neoplasm of central portion of unspecified female breast: Secondary | ICD-10-CM

## 2013-06-12 DIAGNOSIS — R0602 Shortness of breath: Secondary | ICD-10-CM | POA: Diagnosis not present

## 2013-06-12 DIAGNOSIS — R5383 Other fatigue: Secondary | ICD-10-CM | POA: Diagnosis not present

## 2013-06-12 DIAGNOSIS — Z171 Estrogen receptor negative status [ER-]: Secondary | ICD-10-CM | POA: Diagnosis not present

## 2013-06-12 DIAGNOSIS — Z5111 Encounter for antineoplastic chemotherapy: Secondary | ICD-10-CM | POA: Diagnosis not present

## 2013-06-12 DIAGNOSIS — R110 Nausea: Secondary | ICD-10-CM | POA: Diagnosis not present

## 2013-06-12 DIAGNOSIS — E785 Hyperlipidemia, unspecified: Secondary | ICD-10-CM | POA: Diagnosis not present

## 2013-06-12 DIAGNOSIS — C50919 Malignant neoplasm of unspecified site of unspecified female breast: Secondary | ICD-10-CM | POA: Diagnosis not present

## 2013-06-12 DIAGNOSIS — B370 Candidal stomatitis: Secondary | ICD-10-CM | POA: Diagnosis not present

## 2013-06-12 DIAGNOSIS — Z8544 Personal history of malignant neoplasm of other female genital organs: Secondary | ICD-10-CM | POA: Diagnosis not present

## 2013-06-12 DIAGNOSIS — R319 Hematuria, unspecified: Secondary | ICD-10-CM | POA: Diagnosis not present

## 2013-06-12 DIAGNOSIS — N390 Urinary tract infection, site not specified: Secondary | ICD-10-CM | POA: Diagnosis not present

## 2013-06-12 DIAGNOSIS — Z5189 Encounter for other specified aftercare: Secondary | ICD-10-CM | POA: Diagnosis not present

## 2013-06-12 DIAGNOSIS — G609 Hereditary and idiopathic neuropathy, unspecified: Secondary | ICD-10-CM | POA: Diagnosis not present

## 2013-06-12 DIAGNOSIS — R350 Frequency of micturition: Secondary | ICD-10-CM | POA: Diagnosis not present

## 2013-06-12 DIAGNOSIS — C519 Malignant neoplasm of vulva, unspecified: Secondary | ICD-10-CM | POA: Diagnosis not present

## 2013-06-12 DIAGNOSIS — C50911 Malignant neoplasm of unspecified site of right female breast: Secondary | ICD-10-CM | POA: Insufficient documentation

## 2013-06-12 DIAGNOSIS — R197 Diarrhea, unspecified: Secondary | ICD-10-CM | POA: Diagnosis not present

## 2013-06-12 DIAGNOSIS — R5381 Other malaise: Secondary | ICD-10-CM | POA: Diagnosis not present

## 2013-06-12 DIAGNOSIS — C50419 Malignant neoplasm of upper-outer quadrant of unspecified female breast: Secondary | ICD-10-CM | POA: Diagnosis not present

## 2013-06-12 DIAGNOSIS — I10 Essential (primary) hypertension: Secondary | ICD-10-CM | POA: Diagnosis not present

## 2013-06-12 NOTE — Progress Notes (Signed)
ID: Diane [name] D [place]   DOB: 11-13-1940  MR#: [id number]  CSN#:[id number]  PCP: Mathews Argyle, MD GYN:  SUFanny Skates OTHER MD: [name], Nancy Marus, [name] Mardee Postin  CHIEF COMPLAINT:  Right Breast Cancer   HISTORY OF PRESENT ILLNESS: Diane [name] had routine screening mammography at [facility] hospital 08/26/2012 showing a possible distortion in the right breast. Additional views of the breast Center 08/29/2012 confirmed a spiculated mass in the right breast measuring approximately 9 mm. This was not palpable. Ultrasound confirmed an irregular mass in the area in question, again measuring 9 mm. The right axilla was benign.  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 The patient's subsequent history is as detailed below.   INTERVAL HISTORY: Diane [name] returns today for followup of her right breast carcinoma. The interval history is generally unremarkable-- she is feeling "better" overall. She needs some dental work she cannot afford. She has a "knot" in the Right breast she wants me to examine  REVIEW OF SYSTEMS: Diane [name] is not exercising regularly. She is a [employer] member. She has problems with her chief and needs dental work, but has no insurance. She has a runny nose. She had a rotator cuff surgery last year which was incompletely rehabilitation because of her intervening cancer. She is now undergoing rehabilitation for that. She has rare headaches which are not worrisome. She is chronically depressed, but not more depressed currently than prior. A detailed review of systems today was otherwise noncontributory   PAST MEDICAL HISTORY: Past Medical History  Diagnosis Date  . Lower back pain   . Herniated disc   . Arthritis   . Lumbar stenosis     L4-5  . Spondylolysis   . Coronary artery disease   . Hypertension   . Hyperlipemia   . 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 Breast cancer   . Depression   . Dysrhythmia     afib  . Shortness of breath     exertion  . Anxiety   . Diabetes mellitus without complication     diet  . Headache(784.0)   . Status post chemotherapy 11/04/2012 - 01/06/2013.    Docetaxel/Cytoxan Q 3 Weeks x 4 cycles  . S/P radiation therapy 01/23/2013-03/06/2013    Right breast / 46 Gy in 23 fractions / Right breast boost / 14 Gy in 7 fractions    PAST SURGICAL HISTORY: Past Surgical History  Procedure Laterality Date  . Wide local excision  01/2010    Bilateral groin excisions, re-excision of vulva  . Posterior lumbar arthrodesis, pedicle screw fixation, posterolateral arthodesis  03/17/11  . Tonsillectomy    . Abdominal hysterectomy    . Dilation and curettage of uterus    . Tubal ligation    . Colonoscopy    . Shoulder arthroscopy with rotator cuff repair and subacromial decompression  06/04/2012    Procedure: SHOULDER ARTHROSCOPY WITH ROTATOR CUFF REPAIR AND SUBACROMIAL DECOMPRESSION;  Surgeon: Nita Sells, MD;  Location: [place];  Service: Orthopedics;  Laterality: Left;  LEFT SHOULDER ARTHROSCOPY WITH ROTATOR CUFF REPAIR AND SUBACROMIAL DECOMPRESSION AND DISTAL CLAVICLE RESECTION  . Partial mastectomy with needle localization and axillary sentinel lymph node bx Right 10/08/2012    Procedure: RIGHT PARTIAL MASTECTOMY WITH NEEDLE LOCALIZATION AND RIGHT AXILLARY SENTINEL LYMPH NODE BX;  Surgeon: Adin Hector, MD;  Location: [place];  Service: General;  Laterality:  Right;  Injection of 1% Methylene Blue dye into right breast  . Portacath placement Left 10/08/2012    Procedure: INSERTION PORT-A-CATH WITH ULTRASOUND GUIDANCE;  Surgeon: Adin Hector, MD;  Location: [place];  Service: General;  Laterality: Left;  Using 59F [place]  . Vulvectomy partial      FAMILY HISTORY Family History  Problem Relation Age of Onset  . Stroke Mother   . Alzheimer's disease Mother   . Heart  attack Father   . Heart attack Brother    the patient's father died from a heart attack at the age of 65. The patient's mother died at age 71 in the setting of Alzheimer's disease. Diane [name] had 4 brothers, 3 sisters. There is no history of breast or ovarian cancer in the family to her knowledge.  GYNECOLOGIC HISTORY: Menarche age 64, first live birth age 4, she is [place] P3. She had a total abdominal hysterectomy with bilateral salpingo-oophorectomy in 1988. She took estrogen for approximately 30 years, stopping in January 2013.  SOCIAL HISTORY: (updated 03/05/2013) [name] used to work for [employer]. When she retired she moved to [facility], which is for her husband was from. He died approximately 11 years ago. The patient currently lives alone. Her daughter [name] lives with the patient and "hovers over me". Nadine works at [employer] on [employer]. The other 2 children are daughter [name], who lives in [place] and works in a hospital, and [name], who lives in [place] and is a housewife. The patient has 5 grandchildren and 4 great-grandchildren. She is a Furniture conservator/restorer.  ADVANCED DIRECTIVES: Not in place  HEALTH MAINTENANCE: (updated 03/05/2013) History  Substance Use Topics  . Smoking status: Former Smoker -- 50 years    Types: Cigarettes    Quit date: 05/29/2009  . Smokeless tobacco: Never Used  . Alcohol Use: No     Colonoscopy:  PAP: s/p TAH/BSO in 1988  Bone density: June 2011 at [facility] hospital; normal  Lipid panel: Dr. Volanda Napoleon   Allergies  Allergen Reactions  . Shellfish Allergy Swelling    Current Outpatient Prescriptions  Medication Sig Dispense Refill  . cholestyramine (QUESTRAN) 4 G packet Take 1 packet by mouth 2 (two) times daily with a meal. For diarrhea  60 each  12  . dabigatran (PRADAXA) 75 MG CAPS Take 75 mg by mouth every 12 (twelve) hours.      . ferrous sulfate 325 (65 FE) MG tablet Take  325 mg by mouth daily with breakfast.      . FLUoxetine (PROZAC) 10 MG capsule Take 10 mg by mouth daily.      [name] Kitchen gabapentin (NEURONTIN) 100 MG capsule 1 tab by mouth in morning, 1 tab by mouth mid-day, and 3 tabs by mouth at bedtime  150 capsule  3  . guaiFENesin-codeine 100-10 MG/5ML syrup Take 5 mLs by mouth 3 (three) times daily as needed for cough. May take 89mL at a time if necessary.  180 mL  1  . HYDROcodone-acetaminophen (NORCO/VICODIN) 5-325 MG per tablet Take 1-2 tablets by mouth every 4 (four) hours as needed for pain.  40 tablet  1  . LORazepam (ATIVAN) 0.5 MG tablet Take 1 tablet (0.5 mg total) by mouth at bedtime as needed for anxiety (Nausea or vomiting).  30 tablet  0  . nebivolol (BYSTOLIC) 5 MG tablet Take 5 mg by mouth daily.      [name] Kitchen NITROSTAT 0.4 MG SL  tablet       . potassium chloride SA (KLOR-CON M20) 20 MEQ tablet Take 1 tablet (20 mEq total) by mouth daily.  30 tablet  2  . pravastatin (PRAVACHOL) 40 MG tablet Take 40 mg by mouth every evening.      . traZODone (DESYREL) 50 MG tablet Take 50 mg by mouth at bedtime.      . triamterene-hydrochlorothiazide (DYAZIDE) 37.5-25 MG per capsule        No current facility-administered medications for this visit.    OBJECTIVE: Middle-aged [place] American woman  in no acute distress Filed Vitals:   06/12/13 1022  BP: 135/81  Pulse: 46  Temp: 97.5 F (36.4 C)  Resp: 18     Body mass index is 30.78 kg/(m^2).    ECOG FS: 1 Filed Weights   06/12/13 1022  Weight: 199 lb 9.6 oz (90.538 kg)   Sclerae unicteric, pupils equal and reactive Oropharynx clear and moist-- dentition fair No cervical or supraclavicular adenopathy Lungs no rales or rhonchi Heart regular rate and rhythm Abd soft, nontender, positive bowel sounds MSK no focal spinal tenderness, no upper extremity lymphedema Neuro: nonfocal, well oriented, appropriate affect Breasts: The right breast is status post lumpectomy and radiation. There is skin thickening from  the radiation. The area in question, which is minimally tender, is in the medial lower right breast, and there is no palpable mass there. The right axilla is benign. The left breast is unremarkable.    LAB RESULTS: Lab Results  Component Value Date   WBC 3.9 06/05/2013   NEUTROABS 1.9 06/05/2013   HGB 12.1 06/05/2013   HCT 38.1 06/05/2013   MCV 85.2 06/05/2013   PLT 179 06/05/2013      Chemistry      Component Value Date/Time   NA 142 06/05/2013 1052   NA 139 10/03/2012 1441   K 3.1* 06/05/2013 1052   K 3.2* 10/03/2012 1441   CL 105 11/04/2012 0937   CL 101 10/03/2012 1441   CO2 29 06/05/2013 1052   CO2 27 10/03/2012 1441   BUN 22.2 06/05/2013 1052   BUN 19 10/03/2012 1441   CREATININE 1.4* 06/05/2013 1052   CREATININE 1.36* 10/03/2012 1441      Component Value Date/Time   CALCIUM 9.4 06/05/2013 1052   CALCIUM 9.2 10/03/2012 1441   ALKPHOS 74 06/05/2013 1052   ALKPHOS 65 10/03/2012 1441   AST 16 06/05/2013 1052   AST 22 10/03/2012 1441   ALT 14 06/05/2013 1052   ALT 9 10/03/2012 1441   BILITOT 0.30 06/05/2013 1052   BILITOT 0.2* 10/03/2012 1441       STUDIES:  Repeat mammography will be due early April 2015   ASSESSMENT: 73 y.o. [place] woman   (1) s/p [place]th central breast biopsy 08/30/2012 for a clinical T1b No, stage IA invasive ductal carcinoma, grade 3, triple negative, with an MIB-1 of 70%. The largest extent of tumor on the initial biopsy was 0.4 cm  (2)  S/p right lumpectomy under the care of Dr. Dalbert Batman on 10/08/2012 for a pT1a, pN0, stage IA  invasive ductal carcinoma, grade 3 with largest residual invasive focus measuring 0.1 cm in greatest dimension. There was no lymphovascular invasion, with 0 of one lymph node involved. Margins were clear.  (3)  status post adjuvant chemotherapy consisting of 4 q. three-week doses of docetaxel/Cytoxan, first dose on 11/04/2012, and last dose on 01/06/2013.    (4)  radiation therapy, completed October 2014  (5) remote history  of Stage IB vulvar cancer, followed by  Dr. Rogelio Seen  PLAN: Diane [name] is doing fine as far as her breast cancer is concerned that she has an excellent prognosis. I think she should start exercising, and I gave her a copy of the [employer] pamphlet today. I am referring her to our dentist, although I could not guarantee her that he would be able to see her.  She will see Dr. Dalbert Batman her surgeon after her mammogram in April. She will see [place] again in August. At that point we will start seeing her on a once a year basis. She knows to call for any problems that may develop before next visit here.   MAGRINAT,GUSTAV C    06/12/2013

## 2013-06-13 DIAGNOSIS — M25519 Pain in unspecified shoulder: Secondary | ICD-10-CM | POA: Diagnosis not present

## 2013-06-13 DIAGNOSIS — M75120 Complete rotator cuff tear or rupture of unspecified shoulder, not specified as traumatic: Secondary | ICD-10-CM | POA: Diagnosis not present

## 2013-06-16 ENCOUNTER — Telehealth: Payer: Self-pay | Admitting: Oncology

## 2013-06-16 NOTE — Telephone Encounter (Signed)
s/w pt re next appt for 8/20. s/w gail @ [facility] and pt put on recall for appt w/Dr. Dalbert Batman in April - pt aware. pt also made aware that she would need to make contact with a denstist for an appt. no referrals entered for denta appt for appt w/dr ingram.

## 2013-06-17 DIAGNOSIS — M25519 Pain in unspecified shoulder: Secondary | ICD-10-CM | POA: Diagnosis not present

## 2013-06-17 DIAGNOSIS — M75120 Complete rotator cuff tear or rupture of unspecified shoulder, not specified as traumatic: Secondary | ICD-10-CM | POA: Diagnosis not present

## 2013-06-20 DIAGNOSIS — M25519 Pain in unspecified shoulder: Secondary | ICD-10-CM | POA: Diagnosis not present

## 2013-06-20 DIAGNOSIS — M75120 Complete rotator cuff tear or rupture of unspecified shoulder, not specified as traumatic: Secondary | ICD-10-CM | POA: Diagnosis not present

## 2013-06-24 DIAGNOSIS — M75120 Complete rotator cuff tear or rupture of unspecified shoulder, not specified as traumatic: Secondary | ICD-10-CM | POA: Diagnosis not present

## 2013-06-24 DIAGNOSIS — M25519 Pain in unspecified shoulder: Secondary | ICD-10-CM | POA: Diagnosis not present

## 2013-06-27 DIAGNOSIS — M75120 Complete rotator cuff tear or rupture of unspecified shoulder, not specified as traumatic: Secondary | ICD-10-CM | POA: Diagnosis not present

## 2013-06-27 DIAGNOSIS — M25519 Pain in unspecified shoulder: Secondary | ICD-10-CM | POA: Diagnosis not present

## 2013-06-30 DIAGNOSIS — E119 Type 2 diabetes mellitus without complications: Secondary | ICD-10-CM | POA: Diagnosis not present

## 2013-07-01 DIAGNOSIS — M25519 Pain in unspecified shoulder: Secondary | ICD-10-CM | POA: Diagnosis not present

## 2013-07-01 DIAGNOSIS — M75120 Complete rotator cuff tear or rupture of unspecified shoulder, not specified as traumatic: Secondary | ICD-10-CM | POA: Diagnosis not present

## 2013-07-04 DIAGNOSIS — M75120 Complete rotator cuff tear or rupture of unspecified shoulder, not specified as traumatic: Secondary | ICD-10-CM | POA: Diagnosis not present

## 2013-07-04 DIAGNOSIS — M25519 Pain in unspecified shoulder: Secondary | ICD-10-CM | POA: Diagnosis not present

## 2013-07-07 DIAGNOSIS — N189 Chronic kidney disease, unspecified: Secondary | ICD-10-CM | POA: Diagnosis not present

## 2013-07-07 DIAGNOSIS — E119 Type 2 diabetes mellitus without complications: Secondary | ICD-10-CM | POA: Diagnosis not present

## 2013-07-07 DIAGNOSIS — I10 Essential (primary) hypertension: Secondary | ICD-10-CM | POA: Diagnosis not present

## 2013-07-07 DIAGNOSIS — E7800 Pure hypercholesterolemia, unspecified: Secondary | ICD-10-CM | POA: Diagnosis not present

## 2013-07-08 DIAGNOSIS — M75120 Complete rotator cuff tear or rupture of unspecified shoulder, not specified as traumatic: Secondary | ICD-10-CM | POA: Diagnosis not present

## 2013-07-08 DIAGNOSIS — M25519 Pain in unspecified shoulder: Secondary | ICD-10-CM | POA: Diagnosis not present

## 2013-07-11 DIAGNOSIS — M25519 Pain in unspecified shoulder: Secondary | ICD-10-CM | POA: Diagnosis not present

## 2013-07-22 DIAGNOSIS — M75120 Complete rotator cuff tear or rupture of unspecified shoulder, not specified as traumatic: Secondary | ICD-10-CM | POA: Diagnosis not present

## 2013-07-22 DIAGNOSIS — M25519 Pain in unspecified shoulder: Secondary | ICD-10-CM | POA: Diagnosis not present

## 2013-07-25 DIAGNOSIS — M75120 Complete rotator cuff tear or rupture of unspecified shoulder, not specified as traumatic: Secondary | ICD-10-CM | POA: Diagnosis not present

## 2013-07-29 ENCOUNTER — Ambulatory Visit (INDEPENDENT_AMBULATORY_CARE_PROVIDER_SITE_OTHER): Payer: Medicare Other

## 2013-07-29 VITALS — BP 113/58 | HR 81 | Resp 16

## 2013-07-29 DIAGNOSIS — L608 Other nail disorders: Secondary | ICD-10-CM | POA: Diagnosis not present

## 2013-07-29 DIAGNOSIS — M25519 Pain in unspecified shoulder: Secondary | ICD-10-CM | POA: Diagnosis not present

## 2013-07-29 DIAGNOSIS — E1142 Type 2 diabetes mellitus with diabetic polyneuropathy: Secondary | ICD-10-CM | POA: Diagnosis not present

## 2013-07-29 DIAGNOSIS — E1149 Type 2 diabetes mellitus with other diabetic neurological complication: Secondary | ICD-10-CM | POA: Diagnosis not present

## 2013-07-29 DIAGNOSIS — E1140 Type 2 diabetes mellitus with diabetic neuropathy, unspecified: Secondary | ICD-10-CM

## 2013-07-29 NOTE — Patient Instructions (Signed)
Diabetes and Foot Care Diabetes may cause you to have problems because of poor blood supply (circulation) to your feet and legs. This may cause the skin on your feet to become thinner, break easier, and heal more slowly. Your skin may become dry, and the skin may peel and crack. You may also have nerve damage in your legs and feet causing decreased feeling in them. You may not notice minor injuries to your feet that could lead to infections or more serious problems. Taking care of your feet is one of the most important things you can do for yourself.  HOME CARE INSTRUCTIONS  Wear shoes at all times, even in the house. Do not go barefoot. Bare feet are easily injured.  Check your feet daily for blisters, cuts, and redness. If you cannot see the bottom of your feet, use a mirror or ask someone for help.  Wash your feet with warm water (do not use hot water) and mild soap. Then pat your feet and the areas between your toes until they are completely dry. Do not soak your feet as this can dry your skin.  Apply a moisturizing lotion or petroleum jelly (that does not contain alcohol and is unscented) to the skin on your feet and to dry, brittle toenails. Do not apply lotion between your toes.  Trim your toenails straight across. Do not dig under them or around the cuticle. File the edges of your nails with an emery board or nail file.  Do not cut corns or calluses or try to remove them with medicine.  Wear clean socks or stockings every day. Make sure they are not too tight. Do not wear knee-high stockings since they may decrease blood flow to your legs.  Wear shoes that fit properly and have enough cushioning. To break in new shoes, wear them for just a few hours a day. This prevents you from injuring your feet. Always look in your shoes before you put them on to be sure there are no objects inside.  Do not cross your legs. This may decrease the blood flow to your feet.  If you find a minor scrape,  cut, or break in the skin on your feet, keep it and the skin around it clean and dry. These areas may be cleansed with mild soap and water. Do not cleanse the area with peroxide, alcohol, or iodine.  When you remove an adhesive bandage, be sure not to damage the skin around it.  If you have a wound, look at it several times a day to make sure it is healing.  Do not use heating pads or hot water bottles. They may burn your skin. If you have lost feeling in your feet or legs, you may not know it is happening until it is too late.  Make sure your health care provider performs a complete foot exam at least annually or more often if you have foot problems. Report any cuts, sores, or bruises to your health care provider immediately. SEEK MEDICAL CARE IF:   You have an injury that is not healing.  You have cuts or breaks in the skin.  You have an ingrown nail.  You notice redness on your legs or feet.  You feel burning or tingling in your legs or feet.  You have pain or cramps in your legs and feet.  Your legs or feet are numb.  Your feet always feel cold. SEEK IMMEDIATE MEDICAL CARE IF:   There is increasing redness,   swelling, or pain in or around a wound.  There is a red line that goes up your leg.  Pus is coming from a wound.  You develop a fever or as directed by your health care provider.  You notice a bad smell coming from an ulcer or wound. Document Released: 05/12/2000 Document Revised: 01/15/2013 Document Reviewed: 10/22/2012 ExitCare Patient Information 2014 [employer].  

## 2013-07-29 NOTE — Progress Notes (Signed)
   Subjective:    Patient ID: Diane [name], female    DOB: [birth year], 73 y.o.   MRN: [id number]  HPI Comments: "Trim the toenails. The color is starting to come back from the chemo"     Review of Systems no new changes or findings     Objective:   Physical Exam Vascular status is intact with pedal pulses palpable DP and PT plus one over 4 bilateral Refill timed 3-4 seconds all digits neurologically epicritic and proprioceptive sensations intact and symmetric except for decreased sensation distal digits and plantar forefoot. 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 No open wounds ulcerations no secondary infection is noted again normal plantar response DTRs not listed this time.      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 Felipa Eth for new diabetic shoes and custom insoles. Multiple dystrophic from criptotic nails debrided x10 at this time Diane [name] for diabetic foot and palliative care in 3 months and likely shoe measurement at that time. Next  Diane [name] DPM

## 2013-07-31 DIAGNOSIS — M25519 Pain in unspecified shoulder: Secondary | ICD-10-CM | POA: Diagnosis not present

## 2013-08-05 ENCOUNTER — Other Ambulatory Visit: Payer: Self-pay | Admitting: Obstetrics and Gynecology

## 2013-08-05 ENCOUNTER — Other Ambulatory Visit: Payer: Self-pay

## 2013-08-05 ENCOUNTER — Other Ambulatory Visit (INDEPENDENT_AMBULATORY_CARE_PROVIDER_SITE_OTHER): Payer: Self-pay | Admitting: General Surgery

## 2013-08-05 DIAGNOSIS — M75120 Complete rotator cuff tear or rupture of unspecified shoulder, not specified as traumatic: Secondary | ICD-10-CM | POA: Diagnosis not present

## 2013-08-05 DIAGNOSIS — Z853 Personal history of malignant neoplasm of breast: Secondary | ICD-10-CM

## 2013-08-05 DIAGNOSIS — M25519 Pain in unspecified shoulder: Secondary | ICD-10-CM | POA: Diagnosis not present

## 2013-08-06 DIAGNOSIS — M6530 Trigger finger, unspecified finger: Secondary | ICD-10-CM | POA: Diagnosis not present

## 2013-08-06 DIAGNOSIS — M75120 Complete rotator cuff tear or rupture of unspecified shoulder, not specified as traumatic: Secondary | ICD-10-CM | POA: Diagnosis not present

## 2013-08-07 ENCOUNTER — Encounter: Payer: Self-pay | Admitting: Gynecologic Oncology

## 2013-08-07 ENCOUNTER — Ambulatory Visit: Payer: Medicare Other | Attending: Gynecologic Oncology | Admitting: Gynecologic Oncology

## 2013-08-07 VITALS — BP 164/93 | HR 54 | Temp 98.3°F | Ht 67.0 in | Wt 203.3 lb

## 2013-08-07 DIAGNOSIS — Z79899 Other long term (current) drug therapy: Secondary | ICD-10-CM | POA: Diagnosis not present

## 2013-08-07 DIAGNOSIS — F411 Generalized anxiety disorder: Secondary | ICD-10-CM | POA: Insufficient documentation

## 2013-08-07 DIAGNOSIS — E119 Type 2 diabetes mellitus without complications: Secondary | ICD-10-CM | POA: Insufficient documentation

## 2013-08-07 DIAGNOSIS — F329 Major depressive disorder, single episode, unspecified: Secondary | ICD-10-CM | POA: Diagnosis not present

## 2013-08-07 DIAGNOSIS — I4891 Unspecified atrial fibrillation: Secondary | ICD-10-CM | POA: Diagnosis not present

## 2013-08-07 DIAGNOSIS — Z923 Personal history of irradiation: Secondary | ICD-10-CM | POA: Insufficient documentation

## 2013-08-07 DIAGNOSIS — Z853 Personal history of malignant neoplasm of breast: Secondary | ICD-10-CM | POA: Diagnosis not present

## 2013-08-07 DIAGNOSIS — C519 Malignant neoplasm of vulva, unspecified: Secondary | ICD-10-CM | POA: Insufficient documentation

## 2013-08-07 DIAGNOSIS — I2510 Atherosclerotic heart disease of native coronary artery without angina pectoris: Secondary | ICD-10-CM | POA: Insufficient documentation

## 2013-08-07 DIAGNOSIS — Z9221 Personal history of antineoplastic chemotherapy: Secondary | ICD-10-CM | POA: Diagnosis not present

## 2013-08-07 DIAGNOSIS — M48061 Spinal stenosis, lumbar region without neurogenic claudication: Secondary | ICD-10-CM | POA: Insufficient documentation

## 2013-08-07 DIAGNOSIS — F3289 Other specified depressive episodes: Secondary | ICD-10-CM | POA: Diagnosis not present

## 2013-08-07 DIAGNOSIS — Z87891 Personal history of nicotine dependence: Secondary | ICD-10-CM | POA: Insufficient documentation

## 2013-08-07 DIAGNOSIS — E785 Hyperlipidemia, unspecified: Secondary | ICD-10-CM | POA: Insufficient documentation

## 2013-08-07 DIAGNOSIS — I10 Essential (primary) hypertension: Secondary | ICD-10-CM | POA: Insufficient documentation

## 2013-08-07 NOTE — Patient Instructions (Signed)
Return to clinic in 6 months.

## 2013-08-07 NOTE — Progress Notes (Signed)
Consult Note: Gyn-Onc  Diane [name] 73 y.o. female  CC:  Chief Complaint  Patient presents with  . Vulvar Cancer    Follow up    HPI: Diane [name] is a very pleasant 73 year old with a stage IB squamous cell carcinoma of the vulva, status post wide local excision in August 2011. That was a new diagnosis. She subsequently underwent bilateral groin excisions and re-excision of the vulva in September 2011. All lymph nodes were negative and the re-excision site showed no residual carcinoma. I last saw her in Sept. 2014, at which time her exam was unremarkable. There was no evidence of recurrent disease. She comes in today for followup.   Interval History:  I last saw her In September 2014.  She had a routine screening mammography on August 26, 2012 it showed a possible distortion in the right breast. Additional views April 3 confirmed a spiculated mass in the right breast measuring 9 mm. It was not palpable. She underwent biopsy on 09/04/2012 that revealed an invasive ductal carcinoma, grade 3, triple negative. The patient then had 4 cycles of chemotherapy with docetaxel and Cytoxan given in the adjuvant setting. She completed that as well as radiation therapy to the right breast.  She years he is getting out of her system. Her nails are growing out her hair is returned. She has lost about 10 pounds in the past year but she want to loose more. She did have some rectal bleeding during chemotherapy but not since. She had discussed this with Dr. Felipa Eth. She's not due for colonoscopy for another 2 years. She didn't experience some shortness of breath during chemotherapy but that has resolved. She had her eyes checked last month and everything is fine. She is scheduled for followup mammography April 2 in sees Dr. Dalbert Batman in April. She follows up with Dr. Jana Hakim in August of 2015. There are no new medical problems and her family.  Review of Systems:   Constitutional: As above  Denies  fever. Skin: No rash, sores, jaundice, itching, or dryness.  Cardiovascular: No chest pain, shortness of breath, or edema  Pulmonary: As above Gastro Intestinal:  No nausea, vomiting, constipation, or diarrhea reported. No bright red blood per rectum or change in bowel movement since completing chemotherapy  Genitourinary: No frequency, urgency, or dysuria.  Denies vaginal bleeding and discharge.  Musculoskeletal: No myalgia, arthralgia, joint swelling or pain.  Neurologic: No weakness, no numbness or change in gait.  Psychology: Doing well    Current Meds:  Outpatient Encounter Prescriptions as of 08/07/2013  Medication Sig  . cholestyramine (QUESTRAN) 4 G packet Take 1 packet by mouth 2 (two) times daily with a meal. For diarrhea  . dabigatran (PRADAXA) 75 MG CAPS Take 75 mg by mouth every 12 (twelve) hours.  . ferrous sulfate 325 (65 FE) MG tablet Take 325 mg by mouth daily with breakfast.  . FLUoxetine (PROZAC) 10 MG capsule Take 10 mg by mouth daily.  [name] Kitchen gabapentin (NEURONTIN) 100 MG capsule 1 tab by mouth in morning, 1 tab by mouth mid-day, and 3 tabs by mouth at bedtime  . guaiFENesin-codeine 100-10 MG/5ML syrup Take 5 mLs by mouth 3 (three) times daily as needed for cough. May take 23mL at a time if necessary.  [name] Kitchen HYDROcodone-acetaminophen (NORCO/VICODIN) 5-325 MG per tablet Take 1-2 tablets by mouth every 4 (four) hours as needed for pain.  [name] Kitchen LORazepam (ATIVAN) 0.5 MG tablet Take 1 tablet (0.5 mg total) by mouth at bedtime as needed for  anxiety (Nausea or vomiting).  . nebivolol (BYSTOLIC) 5 MG tablet Take 5 mg by mouth daily.  [name] Kitchen NITROSTAT 0.4 MG SL tablet   . potassium chloride SA (KLOR-CON M20) 20 MEQ tablet Take 1 tablet (20 mEq total) by mouth daily.  . pravastatin (PRAVACHOL) 40 MG tablet Take 40 mg by mouth every evening.  . traMADol (ULTRAM) 50 MG tablet   . traZODone (DESYREL) 50 MG tablet Take 50 mg by mouth at bedtime.  . triamterene-hydrochlorothiazide (DYAZIDE) 37.5-25  MG per capsule     Allergy:  Allergies  Allergen Reactions  . Shellfish Allergy Swelling    Social Hx:   History   Social History  . Marital Status: Widowed    Spouse Name: N/A    Number of Children: N/A  . Years of Education: N/A   Occupational History  . Not on file.   Social History Main Topics  . Smoking status: Former Smoker -- 50 years    Types: Cigarettes    Quit date: 05/29/2009  . Smokeless tobacco: Never Used  . Alcohol Use: No  . Drug Use: No  . Sexual Activity: Not on file   Other Topics Concern  . Not on file   Social History Narrative  . No narrative on file    Past Surgical Hx:  Past Surgical History  Procedure Laterality Date  . Wide local excision  01/2010    Bilateral groin excisions, re-excision of vulva  . Posterior lumbar arthrodesis, pedicle screw fixation, posterolateral arthodesis  03/17/11  . Tonsillectomy    . Abdominal hysterectomy    . Dilation and curettage of uterus    . Tubal ligation    . Colonoscopy    . Shoulder arthroscopy with rotator cuff repair and subacromial decompression  06/04/2012    Procedure: SHOULDER ARTHROSCOPY WITH ROTATOR CUFF REPAIR AND SUBACROMIAL DECOMPRESSION;  Surgeon: Nita Sells, MD;  Location: [place];  Service: Orthopedics;  Laterality: Left;  LEFT SHOULDER ARTHROSCOPY WITH ROTATOR CUFF REPAIR AND SUBACROMIAL DECOMPRESSION AND DISTAL CLAVICLE RESECTION  . Partial mastectomy with needle localization and axillary sentinel lymph node bx Right 10/08/2012    Procedure: RIGHT PARTIAL MASTECTOMY WITH NEEDLE LOCALIZATION AND RIGHT AXILLARY SENTINEL LYMPH NODE BX;  Surgeon: Adin Hector, MD;  Location: [place];  Service: General;  Laterality: Right;  Injection of 1% Methylene Blue dye into right breast  . Portacath placement Left 10/08/2012    Procedure: INSERTION PORT-A-CATH WITH ULTRASOUND GUIDANCE;  Surgeon: Adin Hector, MD;  Location: [place];  Service: General;  Laterality: Left;   Using 61F [place]  . Vulvectomy partial      Past Medical Hx:  Past Medical History  Diagnosis Date  . Lower back pain   . Herniated disc   . Arthritis   . Lumbar stenosis     L4-5  . Spondylolysis   . Coronary artery disease   . Hypertension   . Hyperlipemia   . 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 Breast cancer   . Depression   . Dysrhythmia     afib  . Shortness of breath     exertion  . Anxiety   . Diabetes mellitus without complication     diet  . Headache(784.0)   . Status post chemotherapy 11/04/2012 - 01/06/2013.    Docetaxel/Cytoxan Q 3 Weeks x 4 cycles  . S/P radiation therapy 01/23/2013-03/06/2013    Right breast / 46 Gy in 23  fractions / Right breast boost / 14 Gy in 7 fractions    Family Hx:  Family History  Problem Relation Age of Onset  . Stroke Mother   . Alzheimer's disease Mother   . Heart attack Father   . Heart attack Brother     Vitals:  Blood pressure 164/93, pulse 54, temperature 98.3 F (36.8 C), temperature source Oral, height 5\' 7"  (1.702 m), weight 203 lb 4.8 oz (92.216 kg), SpO2 100.00%.  Physical Exam:   GENERAL: Well-nourished, well-developed female in no acute distress.   NECK: Supple. There is no lymphadenopathy, no thyromegaly.   LUNGS: Clear to auscultation bilaterally.   CARDIOVASCULAR: Regular rate and rhythm.   ABDOMEN: Soft, nontender, nondistended. No palpable masses or hepatosplenomegaly. Groins are negative for adenopathy.   EXTREMITIES: There is no edema.   PELVIC: External genitalia is notable for surgical excisions. There is no evidence of any visible lesions. Bimanual examination reveals no thickening, masses, nodularity in the perineum of the vulva. Speculum exam reveals a normal appearing vagina. The vaginal cuff is without lesions. Bimanual exam is negative.  Assessment/Plan:  ASSESSMENT: 73 year old with a stage IB squamous cell carcinoma of vulva, who clinically has no evidence of  recurrent disease, > 3.5 (7/11) years out from her diagnosis.   PLAN: Return to see [place] in 6 months. She will follow up with her other physicians as scheduled.    GEHRIG,PAOLA A., MD 08/07/2013, 11:25 AM  her ORs

## 2013-08-12 DIAGNOSIS — M25519 Pain in unspecified shoulder: Secondary | ICD-10-CM | POA: Diagnosis not present

## 2013-08-14 DIAGNOSIS — M25519 Pain in unspecified shoulder: Secondary | ICD-10-CM | POA: Diagnosis not present

## 2013-08-19 DIAGNOSIS — M25519 Pain in unspecified shoulder: Secondary | ICD-10-CM | POA: Diagnosis not present

## 2013-08-21 ENCOUNTER — Telehealth: Payer: Self-pay | Admitting: *Deleted

## 2013-08-21 NOTE — Telephone Encounter (Signed)
I received a card to please schedule an appointment for something.  I couldn't understand the last of it, it's not clear.  I informed her it was to schedule an appointment to get measured for Diabetic shoes per Caryl Pina.  I transferred her to a scheduler.

## 2013-08-25 ENCOUNTER — Other Ambulatory Visit: Payer: Self-pay

## 2013-08-25 DIAGNOSIS — Z853 Personal history of malignant neoplasm of breast: Secondary | ICD-10-CM

## 2013-08-28 ENCOUNTER — Ambulatory Visit
Admission: RE | Admit: 2013-08-28 | Discharge: 2013-08-28 | Disposition: A | Discharge status: Home / Self Care | Payer: Medicare Other | Source: Ambulatory Visit | Attending: General Surgery | Admitting: General Surgery

## 2013-08-28 DIAGNOSIS — R928 Other abnormal and inconclusive findings on diagnostic imaging of breast: Secondary | ICD-10-CM | POA: Diagnosis not present

## 2013-08-28 DIAGNOSIS — Z853 Personal history of malignant neoplasm of breast: Secondary | ICD-10-CM

## 2013-09-19 ENCOUNTER — Ambulatory Visit: Payer: Medicare Other | Admitting: *Deleted

## 2013-09-19 VITALS — BP 142/98 | HR 48 | Resp 18

## 2013-09-19 DIAGNOSIS — E1140 Type 2 diabetes mellitus with diabetic neuropathy, unspecified: Secondary | ICD-10-CM

## 2013-09-19 NOTE — Progress Notes (Signed)
° °  Subjective:    Patient ID: Diane [name], female    DOB: 16-Dec-1940, 73 y.o.   MRN: [id number]  HPI I am here to get measured for my shoes     Review of Systems     Objective:   Physical Exam        Assessment & Plan:

## 2013-10-06 DIAGNOSIS — R0789 Other chest pain: Secondary | ICD-10-CM | POA: Diagnosis not present

## 2013-10-06 DIAGNOSIS — I10 Essential (primary) hypertension: Secondary | ICD-10-CM | POA: Diagnosis not present

## 2013-10-06 DIAGNOSIS — I059 Rheumatic mitral valve disease, unspecified: Secondary | ICD-10-CM | POA: Diagnosis not present

## 2013-10-06 DIAGNOSIS — E785 Hyperlipidemia, unspecified: Secondary | ICD-10-CM | POA: Diagnosis not present

## 2013-10-06 DIAGNOSIS — N189 Chronic kidney disease, unspecified: Secondary | ICD-10-CM | POA: Diagnosis not present

## 2013-10-06 DIAGNOSIS — E119 Type 2 diabetes mellitus without complications: Secondary | ICD-10-CM | POA: Diagnosis not present

## 2013-10-07 ENCOUNTER — Ambulatory Visit (INDEPENDENT_AMBULATORY_CARE_PROVIDER_SITE_OTHER): Payer: Medicare Other | Admitting: General Surgery

## 2013-10-07 ENCOUNTER — Encounter (INDEPENDENT_AMBULATORY_CARE_PROVIDER_SITE_OTHER): Payer: Self-pay | Admitting: General Surgery

## 2013-10-07 VITALS — BP 110/70 | HR 82 | Temp 97.4°F | Resp 14 | Ht 67.5 in | Wt 209.4 lb

## 2013-10-07 DIAGNOSIS — C50119 Malignant neoplasm of central portion of unspecified female breast: Secondary | ICD-10-CM | POA: Diagnosis not present

## 2013-10-07 NOTE — Progress Notes (Signed)
Patient ID: Diane [name], female   DOB: [birth year], 73 y.o.   MRN: [id number]  History:  This patient underwent right partial mastectomy and sentinel lymph node biopsy and Port-A-Cath insertion on 10/08/2012. She had a triple negative breast cancer, invasive ductal, T1a, N0. She  completed her chemotherapy. She  completed her radiation therapy now. She is followed by Dr. Alycia Rossetti for vulvar cancer. Port-A-Cath was removed on 03/24/2013 in that wound has healed. Bilateral mammograms were performed on 08/28/2013 of these looked good, category 2, no focal abnormality. The patient states that she has some discomfort on her right lateral chest wall and it goes all the way down into her leg. She has a history of back surgery in the past.  Past history, family history, social history, and review of systems are all documented on the chart, unchanged, and noncontributory except as described above.   Exam:  Patient is alert.Pleasant. Cooperative. Neck reveals no adenopathy or mass Lungs clear to auscultation bilaterally Heart regular, bradycardic rate. Both breats are examined. Good symmetry and contour. Hyperpigmentation persists on the right from radiation therapy. No mass or tenderness. Axillary incision feels normal. Range of motion right shoulder normal. No arm swelling. Port site left infraclavicular area well healed.  Assessment:  Invasive, triple negative breast cancer right breast, central upper outer quadrant, T1a,., N0.  No evidence of recurrence 12 months postop right partial mastectomy, sentinel node biopsy, and Port-A-Cath insertion  Status post adjuvant chemotherapy and adjuvant radiation therapy.   Plan:  Next bilateral mammograms due in March 2016  See me in April, 2016  Continue medical oncology followup with Dr. Lenise Arena M. Dalbert Batman, M.D., [facility]  General and Minimally invasive Surgery  Breast and Colorectal Surgery  Office:  [phone]  Pager: [phone]

## 2013-10-07 NOTE — Patient Instructions (Signed)
Examination of your breasts and all of the regional lymph node areas today is normal. Your mammograms are also normal. There is no evidence of cancer.  Keep your regular appointment with Dr. Jana Hakim  Return to see Dr. Dalbert Batman in one year to get your annual mammograms.  Return to see Dr. Dalbert Batman sooner if there is any problem.

## 2013-10-10 DIAGNOSIS — R51 Headache: Secondary | ICD-10-CM | POA: Diagnosis not present

## 2013-10-10 DIAGNOSIS — N183 Chronic kidney disease, stage 3 unspecified: Secondary | ICD-10-CM | POA: Diagnosis not present

## 2013-10-10 DIAGNOSIS — I129 Hypertensive chronic kidney disease with stage 1 through stage 4 chronic kidney disease, or unspecified chronic kidney disease: Secondary | ICD-10-CM | POA: Diagnosis not present

## 2013-10-10 DIAGNOSIS — E785 Hyperlipidemia, unspecified: Secondary | ICD-10-CM | POA: Diagnosis not present

## 2013-10-10 DIAGNOSIS — E1129 Type 2 diabetes mellitus with other diabetic kidney complication: Secondary | ICD-10-CM | POA: Diagnosis not present

## 2013-10-10 DIAGNOSIS — D509 Iron deficiency anemia, unspecified: Secondary | ICD-10-CM | POA: Diagnosis not present

## 2013-11-04 ENCOUNTER — Ambulatory Visit (INDEPENDENT_AMBULATORY_CARE_PROVIDER_SITE_OTHER): Payer: Medicare Other

## 2013-11-04 VITALS — BP 144/64 | HR 57 | Resp 12

## 2013-11-04 DIAGNOSIS — L608 Other nail disorders: Secondary | ICD-10-CM

## 2013-11-04 DIAGNOSIS — E1140 Type 2 diabetes mellitus with diabetic neuropathy, unspecified: Secondary | ICD-10-CM

## 2013-11-04 DIAGNOSIS — E1142 Type 2 diabetes mellitus with diabetic polyneuropathy: Secondary | ICD-10-CM | POA: Diagnosis not present

## 2013-11-04 DIAGNOSIS — E1149 Type 2 diabetes mellitus with other diabetic neurological complication: Secondary | ICD-10-CM | POA: Diagnosis not present

## 2013-11-04 NOTE — Patient Instructions (Signed)
Diabetes and Foot Care Diabetes may cause you to have problems because of poor blood supply (circulation) to your feet and legs. This may cause the skin on your feet to become thinner, break easier, and heal more slowly. Your skin may become dry, and the skin may peel and crack. You may also have nerve damage in your legs and feet causing decreased feeling in them. You may not notice minor injuries to your feet that could lead to infections or more serious problems. Taking care of your feet is one of the most important things you can do for yourself.  HOME CARE INSTRUCTIONS  Wear shoes at all times, even in the house. Do not go barefoot. Bare feet are easily injured.  Check your feet daily for blisters, cuts, and redness. If you cannot see the bottom of your feet, use a mirror or ask someone for help.  Wash your feet with warm water (do not use hot water) and mild soap. Then pat your feet and the areas between your toes until they are completely dry. Do not soak your feet as this can dry your skin.  Apply a moisturizing lotion or petroleum jelly (that does not contain alcohol and is unscented) to the skin on your feet and to dry, brittle toenails. Do not apply lotion between your toes.  Trim your toenails straight across. Do not dig under them or around the cuticle. File the edges of your nails with an emery board or nail file.  Do not cut corns or calluses or try to remove them with medicine.  Wear clean socks or stockings every day. Make sure they are not too tight. Do not wear knee-high stockings since they may decrease blood flow to your legs.  Wear shoes that fit properly and have enough cushioning. To break in new shoes, wear them for just a few hours a day. This prevents you from injuring your feet. Always look in your shoes before you put them on to be sure there are no objects inside.  Do not cross your legs. This may decrease the blood flow to your feet.  If you find a minor scrape,  cut, or break in the skin on your feet, keep it and the skin around it clean and dry. These areas may be cleansed with mild soap and water. Do not cleanse the area with peroxide, alcohol, or iodine.  When you remove an adhesive bandage, be sure not to damage the skin around it.  If you have a wound, look at it several times a day to make sure it is healing.  Do not use heating pads or hot water bottles. They may burn your skin. If you have lost feeling in your feet or legs, you may not know it is happening until it is too late.  Make sure your health care provider performs a complete foot exam at least annually or more often if you have foot problems. Report any cuts, sores, or bruises to your health care provider immediately. SEEK MEDICAL CARE IF:   You have an injury that is not healing.  You have cuts or breaks in the skin.  You have an ingrown nail.  You notice redness on your legs or feet.  You feel burning or tingling in your legs or feet.  You have pain or cramps in your legs and feet.  Your legs or feet are numb.  Your feet always feel cold. SEEK IMMEDIATE MEDICAL CARE IF:   There is increasing redness,   swelling, or pain in or around a wound.  There is a red line that goes up your leg.  Pus is coming from a wound.  You develop a fever or as directed by your health care provider.  You notice a bad smell coming from an ulcer or wound. Document Released: 05/12/2000 Document Revised: 01/15/2013 Document Reviewed: 10/22/2012 ExitCare Patient Information 2014 [employer].  

## 2013-11-04 NOTE — Progress Notes (Signed)
   Subjective:    Patient ID: Diane [name], female    DOB: December 07, 1940, 73 y.o.   MRN: [id number]  HPI ''TOENAILS TRIM.''   Review of Systems no new findings or systemic changes noted     Objective:   Physical Exam Lower extremity objective findings as follows pedal pulses DP and PT plus one over 4 capillary refill time 3 seconds all digits epicritic and proprioceptive sensations intact patient skin turgor measure for shoes however they're not available for fitting and time contacted Susan shoes and insoles become available for fitting. Patient does have diminished neurovascular status has thick brittle crumbly dystrophic nails with glucose incurvation friability 1 through 5 bilateral no open wounds ulcerations no secondary infection is noted current time.      Assessment & Plan:  Assessment diabetes with peripheral neuropathy and paresthesias and dystrophic friable criptotic nails 1 through 5 bilateral nails are debrided at this time maintained for the topical antifungal therapy is we'll contact patient once shoes ready for fitting and dispensing something the future. Recheck in 3 months for palliative nail care  [name]

## 2013-11-11 ENCOUNTER — Encounter: Payer: Medicare Other | Attending: Geriatric Medicine | Admitting: *Deleted

## 2013-11-11 ENCOUNTER — Encounter: Payer: Self-pay | Admitting: *Deleted

## 2013-11-11 ENCOUNTER — Telehealth: Payer: Self-pay | Admitting: *Deleted

## 2013-11-11 VITALS — Ht 67.5 in | Wt 206.5 lb

## 2013-11-11 DIAGNOSIS — E119 Type 2 diabetes mellitus without complications: Secondary | ICD-10-CM | POA: Diagnosis not present

## 2013-11-11 DIAGNOSIS — Z713 Dietary counseling and surveillance: Secondary | ICD-10-CM | POA: Diagnosis not present

## 2013-11-11 NOTE — Patient Instructions (Signed)
Plan:  Aim for  2- 3 Carb Choices per meal (30-   45 grams) +/- 1 either way  Aim for 0-15 Carbs per snack if hungry  Include protein in moderation with your meals and snacks Consider reading food labels for Total Carbohydrate and Fat Grams of foods Consider  increasing your activity level by walking for 30 minutes daily as tolerated Consider checking BG at alternate times per day to include fasting and 2 hours after dinner as directed by MD  Continue taking medication as directed by MD  Consider [facility] Protein bars for snacks Consider [employer] Shark for butter substitute Consider Dannon Lite & Fit Greek Yogurt Lance crackers crackers & cheese / crackers & peanut butter .[name] KitchenMarland KitchenMarland Kitchenpreferably only eat 4 crackers  Breakfast: 1-egg, 1-toast, 1-instant grits, 1-sausage patty

## 2013-11-11 NOTE — Progress Notes (Signed)
Appt start time: 0930 end time:  1100.  Assessment:  Patient was seen on  11/11/13 for individual diabetes education. Patient was sent for DSME by Dr. Felipa Eth due to weight and glucose elevation. Patient [place] by herself in a senior citizens community. Does her own shopping and food preparation. I phoned Dr. Carlyle Lipa office to see if he might be interested in increasing Metformin to 1000mg  daily due to FBS in the 200-300 range. Also consider repeat A1c at 3 months.  Current HbA1c: 6.4%    FBS readings range 94-345 mg/dl over the past few months  Preferred Learning Style:   No preference indicated   Learning Readiness:   Ready  MEDICATIONS: See List: Metformin ER 500mg  qd  DIETARY INTAKE:   B ( AM): eggs, single packet grits, sausage, 2 slices whole wheat toast , Water Snk ( AM): cookies L ( PM): progresso soup, grilled cheese sandwich Snk ( PM): fruit D ( PM): fried fish, mashed potato, kale, french fries, fried chicken, pork chops/fried or baked Snk ( PM): vanilla wafers, cookies, sunflower seeds,  Beverages: water, hot tea sweet 'n low creamer  Usual physical activity: none at present    Intervention:  Nutrition counseling provided.  Discussed diabetes disease process and treatment options.  Discussed physiology of diabetes and role of obesity on insulin resistance.  Encouraged moderate weight reduction to improve glucose levels.  Discussed role of medications and diet in glucose control  Provided education on macronutrients on glucose levels.  Provided education on carb counting, importance of regularly scheduled meals/snacks, and meal planning  Discussed effects of physical activity on glucose levels and long-term glucose control.  Recommended 150 minutes of physical activity/week.  Reviewed patient medications.  Discussed role of medication on blood glucose and possible side effects  Discussed blood glucose monitoring and interpretation.  Discussed recommended target  ranges and individual ranges.    Described short-term complications: hyper- and hypo-glycemia.  Discussed causes,symptoms, and treatment options.  Discussed prevention, detection, and treatment of long-term complications.  Discussed the role of prolonged elevated glucose levels on body systems.  Discussed role of stress on blood glucose levels and discussed strategies to manage psychosocial issues.  Discussed recommendations for long-term diabetes self-care.  Established checklist for medical, dental, and emotional self-care.  Plan:  Aim for  2- 3 Carb Choices per meal (30-45 grams) +/- 1 either way  Aim for 0-15 Carbs per snack if hungry  Include protein in moderation with your meals and snacks Consider reading food labels for Total Carbohydrate and Fat Grams of foods Consider  increasing your activity level by walking for 30 minutes daily as tolerated Consider checking BG at alternate times per day to include fasting and 2 hours after dinner as directed by MD  Continue taking medication as directed by MD  Consider [facility] Protein bars for snacks Consider [employer] Shark for butter substitute Consider Dannon Lite & Fit Greek Yogurt Lance crackers crackers & cheese / crackers & peanut butter .[name] KitchenMarland KitchenMarland Kitchenpreferably only eat 4 crackers  Breakfast: 1-egg, 1-toast, 1-instant grits, 1-sausage patty  Teaching Method Utilized:  Visual Auditory Hands on  Handouts given during visit include: Living Well with Diabetes Carb Counting  Meal Plan Card My plate  Snack sheet  Barriers to learning/adherence to lifestyle change: motivation  Diabetes self-care support plan:   [facility] support group  Demonstrated degree of understanding via:  Teach Back   Monitoring/Evaluation:  Dietary intake, exercise, test glucose , and body weight prn.

## 2013-12-29 ENCOUNTER — Telehealth: Payer: Self-pay | Admitting: *Deleted

## 2013-12-29 NOTE — Telephone Encounter (Signed)
I returned her call and told her the insoles are here.  I told her I didn't see the shoes.  She stated the shoes were supposed to have already been here.  We were just waiting on the insoles.  I told her I would get Diane [name] to call her back about the shoes on tomorrow.  She stated okay.

## 2013-12-29 NOTE — Telephone Encounter (Signed)
Calling to find out what's going on with the insoles for my shoes.  It's been months now and I haven't heard anything.  Thank you, bye bye.

## 2014-01-05 ENCOUNTER — Ambulatory Visit: Payer: Medicare Other

## 2014-01-05 DIAGNOSIS — I059 Rheumatic mitral valve disease, unspecified: Secondary | ICD-10-CM | POA: Diagnosis not present

## 2014-01-05 DIAGNOSIS — E119 Type 2 diabetes mellitus without complications: Secondary | ICD-10-CM | POA: Diagnosis not present

## 2014-01-05 DIAGNOSIS — I4891 Unspecified atrial fibrillation: Secondary | ICD-10-CM | POA: Diagnosis not present

## 2014-01-05 DIAGNOSIS — E1140 Type 2 diabetes mellitus with diabetic neuropathy, unspecified: Secondary | ICD-10-CM

## 2014-01-05 DIAGNOSIS — E785 Hyperlipidemia, unspecified: Secondary | ICD-10-CM | POA: Diagnosis not present

## 2014-01-05 DIAGNOSIS — N189 Chronic kidney disease, unspecified: Secondary | ICD-10-CM | POA: Diagnosis not present

## 2014-01-05 DIAGNOSIS — I10 Essential (primary) hypertension: Secondary | ICD-10-CM | POA: Diagnosis not present

## 2014-01-05 NOTE — Patient Instructions (Signed)

## 2014-01-05 NOTE — Progress Notes (Signed)
   Subjective:    Patient ID: Diane [name], female    DOB: 1940/06/13, 74 y.o.   MRN: [id number]  HPI    Review of Systems     Objective:   Physical Exam        Assessment & Plan:

## 2014-01-15 ENCOUNTER — Encounter: Payer: Self-pay | Admitting: Nurse Practitioner

## 2014-01-15 ENCOUNTER — Ambulatory Visit (HOSPITAL_BASED_OUTPATIENT_CLINIC_OR_DEPARTMENT_OTHER): Payer: Medicare Other | Admitting: Nurse Practitioner

## 2014-01-15 ENCOUNTER — Other Ambulatory Visit (HOSPITAL_BASED_OUTPATIENT_CLINIC_OR_DEPARTMENT_OTHER): Payer: Medicare Other

## 2014-01-15 VITALS — BP 140/57 | HR 49 | Temp 97.4°F | Resp 20 | Ht 67.5 in | Wt 203.0 lb

## 2014-01-15 DIAGNOSIS — C50919 Malignant neoplasm of unspecified site of unspecified female breast: Secondary | ICD-10-CM | POA: Diagnosis not present

## 2014-01-15 DIAGNOSIS — E876 Hypokalemia: Secondary | ICD-10-CM | POA: Diagnosis not present

## 2014-01-15 DIAGNOSIS — Z8544 Personal history of malignant neoplasm of other female genital organs: Secondary | ICD-10-CM | POA: Diagnosis not present

## 2014-01-15 DIAGNOSIS — Z171 Estrogen receptor negative status [ER-]: Secondary | ICD-10-CM | POA: Diagnosis not present

## 2014-01-15 DIAGNOSIS — R319 Hematuria, unspecified: Secondary | ICD-10-CM | POA: Diagnosis not present

## 2014-01-15 DIAGNOSIS — C50911 Malignant neoplasm of unspecified site of right female breast: Secondary | ICD-10-CM

## 2014-01-15 DIAGNOSIS — N390 Urinary tract infection, site not specified: Secondary | ICD-10-CM | POA: Diagnosis not present

## 2014-01-15 DIAGNOSIS — C50419 Malignant neoplasm of upper-outer quadrant of unspecified female breast: Secondary | ICD-10-CM | POA: Diagnosis not present

## 2014-01-15 DIAGNOSIS — G620 Drug-induced polyneuropathy: Secondary | ICD-10-CM | POA: Diagnosis not present

## 2014-01-15 DIAGNOSIS — R0602 Shortness of breath: Secondary | ICD-10-CM | POA: Diagnosis not present

## 2014-01-15 DIAGNOSIS — B370 Candidal stomatitis: Secondary | ICD-10-CM | POA: Diagnosis not present

## 2014-01-15 DIAGNOSIS — C519 Malignant neoplasm of vulva, unspecified: Secondary | ICD-10-CM | POA: Diagnosis not present

## 2014-01-15 DIAGNOSIS — R197 Diarrhea, unspecified: Secondary | ICD-10-CM | POA: Diagnosis not present

## 2014-01-15 DIAGNOSIS — E785 Hyperlipidemia, unspecified: Secondary | ICD-10-CM | POA: Diagnosis not present

## 2014-01-15 DIAGNOSIS — G609 Hereditary and idiopathic neuropathy, unspecified: Secondary | ICD-10-CM | POA: Diagnosis not present

## 2014-01-15 DIAGNOSIS — R350 Frequency of micturition: Secondary | ICD-10-CM | POA: Diagnosis not present

## 2014-01-15 DIAGNOSIS — I10 Essential (primary) hypertension: Secondary | ICD-10-CM | POA: Diagnosis not present

## 2014-01-15 DIAGNOSIS — C50119 Malignant neoplasm of central portion of unspecified female breast: Secondary | ICD-10-CM | POA: Diagnosis not present

## 2014-01-15 DIAGNOSIS — Z5189 Encounter for other specified aftercare: Secondary | ICD-10-CM | POA: Diagnosis not present

## 2014-01-15 DIAGNOSIS — R5383 Other fatigue: Secondary | ICD-10-CM | POA: Diagnosis not present

## 2014-01-15 DIAGNOSIS — R110 Nausea: Secondary | ICD-10-CM | POA: Diagnosis not present

## 2014-01-15 DIAGNOSIS — Z5111 Encounter for antineoplastic chemotherapy: Secondary | ICD-10-CM | POA: Diagnosis not present

## 2014-01-15 DIAGNOSIS — R5381 Other malaise: Secondary | ICD-10-CM | POA: Diagnosis not present

## 2014-01-15 DIAGNOSIS — E860 Dehydration: Secondary | ICD-10-CM | POA: Diagnosis not present

## 2014-01-15 LAB — CBC WITH DIFFERENTIAL/PLATELET
BASO%: 0.7 % (ref 0.0–2.0)
Basophils Absolute: 0 10*3/uL (ref 0.0–0.1)
EOS%: 3.4 % (ref 0.0–7.0)
Eosinophils Absolute: 0.2 10*3/uL (ref 0.0–0.5)
HCT: 34.3 % — ABNORMAL LOW (ref 34.8–46.6)
HGB: 10.8 g/dL — ABNORMAL LOW (ref 11.6–15.9)
LYMPH%: 36.7 % (ref 14.0–49.7)
MCH: 27.2 pg (ref 25.1–34.0)
MCHC: 31.6 g/dL (ref 31.5–36.0)
MCV: 86 fL (ref 79.5–101.0)
MONO#: 0.6 10*3/uL (ref 0.1–0.9)
MONO%: 11.4 % (ref 0.0–14.0)
NEUT#: 2.4 10*3/uL (ref 1.5–6.5)
NEUT%: 47.8 % (ref 38.4–76.8)
Platelets: 177 10*3/uL (ref 145–400)
RBC: 3.99 10*6/uL (ref 3.70–5.45)
RDW: 14.6 % — ABNORMAL HIGH (ref 11.2–14.5)
WBC: 5.1 10*3/uL (ref 3.9–10.3)
lymph#: 1.9 10*3/uL (ref 0.9–3.3)

## 2014-01-15 LAB — COMPREHENSIVE METABOLIC PANEL (CC13)
ALT: 9 U/L (ref 0–55)
AST: 15 U/L (ref 5–34)
Albumin: 3.5 g/dL (ref 3.5–5.0)
Alkaline Phosphatase: 69 U/L (ref 40–150)
Anion Gap: 10 mEq/L (ref 3–11)
BUN: 21 mg/dL (ref 7.0–26.0)
CO2: 26 mEq/L (ref 22–29)
Calcium: 9.2 mg/dL (ref 8.4–10.4)
Chloride: 102 mEq/L (ref 98–109)
Creatinine: 1.5 mg/dL — ABNORMAL HIGH (ref 0.6–1.1)
Glucose: 109 mg/dl (ref 70–140)
Potassium: 3.4 mEq/L — ABNORMAL LOW (ref 3.5–5.1)
Sodium: 138 mEq/L (ref 136–145)
Total Bilirubin: 0.28 mg/dL (ref 0.20–1.20)
Total Protein: 7.2 g/dL (ref 6.4–8.3)

## 2014-01-15 NOTE — Progress Notes (Signed)
ID: Diane [name]   DOB: 1940/08/15  MR#: [id number]  [id number]  PCP: Mathews Argyle, MD GYN:  SUFanny Skates OTHER MD: [name], Nancy Marus, [name] Mardee Postin  CHIEF COMPLAINT:  Right Breast Cancer CURRENT TREATMENT: observation  BREAST CANCER HISTORY: Diane had routine screening mammography at [facility] hospital 08/26/2012 showing a possible distortion in the right breast. Additional views of the breast Center 08/29/2012 confirmed a spiculated mass in the right breast measuring approximately 9 mm. This was not palpable. Ultrasound confirmed an irregular mass in the area in question, again measuring 9 mm. The right axilla was benign.  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 The patient's subsequent history is as detailed below.   INTERVAL HISTORY: Diane returns today for followup of her right breast carcinoma. The interval history is remarkable for a tooth extraction that occurred last week. She is recovering well. The side that the tooth was removed from is still tender, but she is eating fine. She experiences occasional hot flashes, but they do not interfere with her life or wake her up at night. Diane has not been exercising, though she knows she should. She considers "walking around [place]" her activity. She is going to call [employer] about potential subscribers to the [employer]. She is uninterested in the local Y.  REVIEW OF SYSTEMS: Diane denies fevers, chills, recent injury or illness, nausea, vomiting, changes in bowel or bladder habits. She denies shortness of breath, chest pain, cough, weakness, or fatigue. A detailed review of systems was entirely negative except as noted above.    PAST MEDICAL HISTORY: Past Medical History  Diagnosis Date  . Lower back pain   . Herniated disc   . Arthritis   . Lumbar stenosis     L4-5  .  Spondylolysis   . Coronary artery disease   . Hypertension   . Hyperlipemia   . 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 Breast cancer   . Depression   . Dysrhythmia     afib  . Shortness of breath     exertion  . Anxiety   . Diabetes mellitus without complication     diet  . Headache(784.0)   . Status post chemotherapy 11/04/2012 - 01/06/2013.    Docetaxel/Cytoxan Q 3 Weeks x 4 cycles  . S/P radiation therapy 01/23/2013-03/06/2013    Right breast / 46 Gy in 23 fractions / Right breast boost / 14 Gy in 7 fractions    PAST SURGICAL HISTORY: Past Surgical History  Procedure Laterality Date  . Wide local excision  01/2010    Bilateral groin excisions, re-excision of vulva  . Posterior lumbar arthrodesis, pedicle screw fixation, posterolateral arthodesis  03/17/11  . Tonsillectomy    . Abdominal hysterectomy    . Dilation and curettage of uterus    . Tubal ligation    . Colonoscopy    . Shoulder arthroscopy with rotator cuff repair and subacromial decompression  06/04/2012    Procedure: SHOULDER ARTHROSCOPY WITH ROTATOR CUFF REPAIR AND SUBACROMIAL DECOMPRESSION;  Surgeon: Nita Sells, MD;  Location: [facility];  Service: Orthopedics;  Laterality: Left;  LEFT SHOULDER ARTHROSCOPY WITH ROTATOR CUFF REPAIR AND SUBACROMIAL DECOMPRESSION AND DISTAL CLAVICLE RESECTION  . Partial mastectomy with needle localization and axillary sentinel lymph node bx Right 10/08/2012    Procedure: RIGHT PARTIAL MASTECTOMY WITH NEEDLE LOCALIZATION AND RIGHT  AXILLARY SENTINEL LYMPH NODE BX;  Surgeon: Adin Hector, MD;  Location: [facility];  Service: General;  Laterality: Right;  Injection of 1% Methylene Blue dye into right breast  . Portacath placement Left 10/08/2012    Procedure: INSERTION PORT-A-CATH WITH ULTRASOUND GUIDANCE;  Surgeon: Adin Hector, MD;  Location: [facility];  Service: General;  Laterality: Left;  Using 87F [place]  . Vulvectomy partial       FAMILY HISTORY Family History  Problem Relation Age of Onset  . Stroke Mother   . Alzheimer's disease Mother   . Heart attack Father   . Heart attack Brother    the patient's father died from a heart attack at the age of 60. The patient's mother died at age 52 in the setting of Alzheimer's disease. Diane had 4 brothers, 3 sisters. There is no history of breast or ovarian cancer in the family to her knowledge.  GYNECOLOGIC HISTORY: Menarche age 63, first live birth age 56, she is [place] P3. She had a total abdominal hysterectomy with bilateral salpingo-oophorectomy in 1988. She took estrogen for approximately 30 years, stopping in January 2013.  SOCIAL HISTORY: (updated 03/05/2013) [name] used to work for [employer]. When she retired she moved to [facility], which is for her husband was from. He died approximately 11 years ago. The patient currently lives alone. Her daughter [name] lives with the patient and "hovers over me". Nadine works at [employer] on [employer]. The other 2 children are daughter [name], who lives in [place] and works in a hospital, and [name], who lives in [place] and is a housewife. The patient has 5 grandchildren and 4 great-grandchildren. She is a Furniture conservator/restorer.  ADVANCED DIRECTIVES: Not in place  HEALTH MAINTENANCE: (updated 03/05/2013) History  Substance Use Topics  . Smoking status: Former Smoker -- 50 years    Types: Cigarettes    Quit date: 05/29/2009  . Smokeless tobacco: Never Used  . Alcohol Use: No     Colonoscopy:  PAP: s/p TAH/BSO in 1988  Bone density: June 2011 at [facility] hospital; normal  Lipid panel: Dr. Volanda Napoleon   Allergies  Allergen Reactions  . Shellfish Allergy Swelling    Current Outpatient Prescriptions  Medication Sig Dispense Refill  . dabigatran (PRADAXA) 75 MG CAPS Take 75 mg by mouth every 12 (twelve) hours.      . ferrous sulfate 325 (65 FE) MG tablet  Take 325 mg by mouth daily with breakfast.      . FLUoxetine (PROZAC) 10 MG capsule Take 10 mg by mouth daily.      [name] Kitchen gabapentin (NEURONTIN) 100 MG capsule 1 tab by mouth in morning, 1 tab by mouth mid-day, and 3 tabs by mouth at bedtime  150 capsule  3  . HYDROcodone-acetaminophen (NORCO/VICODIN) 5-325 MG per tablet Take 1-2 tablets by mouth every 4 (four) hours as needed for pain.  40 tablet  1  . KLOR-CON M20 20 MEQ tablet       . metFORMIN (GLUCOPHAGE-XR) 500 MG 24 hr tablet Take 500 mg by mouth 2 (two) times daily. Total dose of 1000mg  daily      . nebivolol (BYSTOLIC) 5 MG tablet Take 5 mg by mouth daily.      [name] Kitchen NITROSTAT 0.4 MG SL tablet       . pravastatin (PRAVACHOL) 40 MG tablet Take 40 mg by mouth every evening.      . traMADol Veatrice Bourbon)  50 MG tablet       . traZODone (DESYREL) 50 MG tablet Take 50 mg by mouth at bedtime.      . triamterene-hydrochlorothiazide (DYAZIDE) 37.5-25 MG per capsule        No current facility-administered medications for this visit.    OBJECTIVE: Middle-aged [place] American woman  in no acute distress Filed Vitals:   01/15/14 1354  BP: 140/57  Pulse: 49  Temp: 97.4 F (36.3 C)  Resp: 20     Body mass index is 31.31 kg/(m^2).    ECOG FS: 0 Filed Weights   01/15/14 1354  Weight: 203 lb (92.08 kg)   Skin: warm, dry  HEENT: sclerae anicteric, conjunctivae pink, oropharynx clear. Good dentition. No thrush or mucositis.  Lymph Nodes: No cervical or supraclavicular lymphadenopathy  Lungs: clear to auscultation bilaterally, no rales, wheezes, or rhonci  Heart: regular rate and rhythm  Abdomen: round, soft, non tender, positive bowel sounds  Musculoskeletal: No focal spinal tenderness, no peripheral edema  Neuro: non focal, well oriented, appropriate affect  Breast: right breast status post lumpectomy and radiation. Hyperpigmentation and skin thickening noted in inner lower quadrant of the breast. No evidence of recurrent disease. Right axilla benign.  Left breast unremarkable.    LAB RESULTS: Lab Results  Component Value Date   WBC 5.1 01/15/2014   NEUTROABS 2.4 01/15/2014   HGB 10.8* 01/15/2014   HCT 34.3* 01/15/2014   MCV 86.0 01/15/2014   PLT 177 01/15/2014      Chemistry      Component Value Date/Time   NA 138 01/15/2014 1336   NA 139 10/03/2012 1441   K 3.4* 01/15/2014 1336   K 3.2* 10/03/2012 1441   CL 105 11/04/2012 0937   CL 101 10/03/2012 1441   CO2 26 01/15/2014 1336   CO2 27 10/03/2012 1441   BUN 21.0 01/15/2014 1336   BUN 19 10/03/2012 1441   CREATININE 1.5* 01/15/2014 1336   CREATININE 1.36* 10/03/2012 1441      Component Value Date/Time   CALCIUM 9.2 01/15/2014 1336   CALCIUM 9.2 10/03/2012 1441   ALKPHOS 69 01/15/2014 1336   ALKPHOS 65 10/03/2012 1441   AST 15 01/15/2014 1336   AST 22 10/03/2012 1441   ALT 9 01/15/2014 1336   ALT 9 10/03/2012 1441   BILITOT 0.28 01/15/2014 1336   BILITOT 0.2* 10/03/2012 1441       STUDIES: Most recent mammogram on 08/28/2013 was unremarkable.    ASSESSMENT: 73 y.o. [place] woman   (1) s/p [place] central breast biopsy 08/30/2012 for a clinical T1b No, stage IA invasive ductal carcinoma, grade 3, triple negative, with an MIB-1 of 70%. The largest extent of tumor on the initial biopsy was 0.4 cm  (2)  S/p right lumpectomy under the care of Dr. Dalbert Batman on 10/08/2012 for a pT1a, pN0, stage IA  invasive ductal carcinoma, grade 3 with largest residual invasive focus measuring 0.1 cm in greatest dimension. There was no lymphovascular invasion, with 0 of one lymph node involved. Margins were clear.  (3)  status post adjuvant chemotherapy consisting of 4 q. three-week doses of docetaxel/Cytoxan, first dose on 11/04/2012, and last dose on 01/06/2013.    (4)  radiation therapy, completed October 2014  (5) remote history of Stage IB vulvar cancer, followed by Dr. Rogelio Seen  PLAN: Diane is doing well as far as her breast cancer is concerned. The labs were reviewed in detail with her and were stable. Her  potassium was 3.4 and  she hasn't had her supplement in months. She wishes not to resume them, and I think this is fine. I encouraged her to begin a walking program if she was uninterested in other forms of exercise.   She has an appt with her gyn onc physician next month. Diane is doing well and will now start seeing [place] on a yearly basis until she is at least 5 years out from her surgery. She understands and agrees with this plan. She has been encouraged to call with any issues that might arise before her next visit here.   Marcelino Duster    01/15/2014

## 2014-01-21 ENCOUNTER — Telehealth: Payer: Self-pay | Admitting: Nurse Practitioner

## 2014-01-21 NOTE — Telephone Encounter (Signed)
, °

## 2014-01-27 DIAGNOSIS — J309 Allergic rhinitis, unspecified: Secondary | ICD-10-CM | POA: Diagnosis not present

## 2014-01-27 DIAGNOSIS — R300 Dysuria: Secondary | ICD-10-CM | POA: Diagnosis not present

## 2014-02-03 ENCOUNTER — Ambulatory Visit (INDEPENDENT_AMBULATORY_CARE_PROVIDER_SITE_OTHER): Payer: Medicare Other

## 2014-02-03 DIAGNOSIS — E1149 Type 2 diabetes mellitus with other diabetic neurological complication: Secondary | ICD-10-CM | POA: Diagnosis not present

## 2014-02-03 DIAGNOSIS — L608 Other nail disorders: Secondary | ICD-10-CM | POA: Diagnosis not present

## 2014-02-03 DIAGNOSIS — E1140 Type 2 diabetes mellitus with diabetic neuropathy, unspecified: Secondary | ICD-10-CM

## 2014-02-03 NOTE — Patient Instructions (Signed)
Diabetes and Foot Care Diabetes may cause you to have problems because of poor blood supply (circulation) to your feet and legs. This may cause the skin on your feet to become thinner, break easier, and heal more slowly. Your skin may become dry, and the skin may peel and crack. You may also have nerve damage in your legs and feet causing decreased feeling in them. You may not notice minor injuries to your feet that could lead to infections or more serious problems. Taking care of your feet is one of the most important things you can do for yourself.  HOME CARE INSTRUCTIONS  Wear shoes at all times, even in the house. Do not go barefoot. Bare feet are easily injured.  Check your feet daily for blisters, cuts, and redness. If you cannot see the bottom of your feet, use a mirror or ask someone for help.  Wash your feet with warm water (do not use hot water) and mild soap. Then pat your feet and the areas between your toes until they are completely dry. Do not soak your feet as this can dry your skin.  Apply a moisturizing lotion or petroleum jelly (that does not contain alcohol and is unscented) to the skin on your feet and to dry, brittle toenails. Do not apply lotion between your toes.  Trim your toenails straight across. Do not dig under them or around the cuticle. File the edges of your nails with an emery board or nail file.  Do not cut corns or calluses or try to remove them with medicine.  Wear clean socks or stockings every day. Make sure they are not too tight. Do not wear knee-high stockings since they may decrease blood flow to your legs.  Wear shoes that fit properly and have enough cushioning. To break in new shoes, wear them for just a few hours a day. This prevents you from injuring your feet. Always look in your shoes before you put them on to be sure there are no objects inside.  Do not cross your legs. This may decrease the blood flow to your feet.  If you find a minor scrape,  cut, or break in the skin on your feet, keep it and the skin around it clean and dry. These areas may be cleansed with mild soap and water. Do not cleanse the area with peroxide, alcohol, or iodine.  When you remove an adhesive bandage, be sure not to damage the skin around it.  If you have a wound, look at it several times a day to make sure it is healing.  Do not use heating pads or hot water bottles. They may burn your skin. If you have lost feeling in your feet or legs, you may not know it is happening until it is too late.  Make sure your health care provider performs a complete foot exam at least annually or more often if you have foot problems. Report any cuts, sores, or bruises to your health care provider immediately. SEEK MEDICAL CARE IF:   You have an injury that is not healing.  You have cuts or breaks in the skin.  You have an ingrown nail.  You notice redness on your legs or feet.  You feel burning or tingling in your legs or feet.  You have pain or cramps in your legs and feet.  Your legs or feet are numb.  Your feet always feel cold. SEEK IMMEDIATE MEDICAL CARE IF:   There is increasing redness,   swelling, or pain in or around a wound.  There is a red line that goes up your leg.  Pus is coming from a wound.  You develop a fever or as directed by your health care provider.  You notice a bad smell coming from an ulcer or wound. Document Released: 05/12/2000 Document Revised: 01/15/2013 Document Reviewed: 10/22/2012 ExitCare Patient Information 2015 [employer]. This information is not intended to replace advice given to you by your health care provider. Make sure you discuss any questions you have with your health care provider.  

## 2014-02-03 NOTE — Progress Notes (Signed)
   Subjective:    Patient ID: Diane [name], female    DOB: 1940/11/23, 73 y.o.   MRN: [id number]  HPI Pt presents for nail debridement   Review of Systems no new findings or systemic changes noted     Objective:   Physical Exam Neurovascular status is unchanged pedal pulses DP and PT plus one over 4 bilateral capillary refill time 4 seconds all digits epicritic and proprioceptive sensations intact although decreased on Semmes Weinstein to the forefoot and plantar digits and arch no open wounds or ulcers no secondary infections nails thick brittle criptotic incurvated in particular hallux nails bilateral nails painful tender symptomatic both and debridement and with ambulation at times both wounds or ulcers no secondary infections noted      Assessment & Plan:  Assessment this time his diabetes with peripheral neuropathy slight paresthesia and nail dystrophy criptotic incurvated nails 1 through 5 bilateral debrided this time continue with topical antifungals as needed recommended recheck in 3 months for continued palliative care in the future  [name]

## 2014-03-05 ENCOUNTER — Ambulatory Visit: Payer: Medicare Other | Attending: Gynecologic Oncology | Admitting: Gynecologic Oncology

## 2014-03-05 ENCOUNTER — Encounter: Payer: Self-pay | Admitting: Gynecologic Oncology

## 2014-03-05 VITALS — BP 152/58 | HR 50 | Temp 98.2°F | Resp 18 | Ht 67.5 in | Wt 202.8 lb

## 2014-03-05 DIAGNOSIS — Z923 Personal history of irradiation: Secondary | ICD-10-CM | POA: Diagnosis not present

## 2014-03-05 DIAGNOSIS — C519 Malignant neoplasm of vulva, unspecified: Secondary | ICD-10-CM | POA: Insufficient documentation

## 2014-03-05 DIAGNOSIS — Z87891 Personal history of nicotine dependence: Secondary | ICD-10-CM | POA: Insufficient documentation

## 2014-03-05 DIAGNOSIS — Z8544 Personal history of malignant neoplasm of other female genital organs: Secondary | ICD-10-CM | POA: Diagnosis not present

## 2014-03-05 DIAGNOSIS — Z08 Encounter for follow-up examination after completed treatment for malignant neoplasm: Secondary | ICD-10-CM | POA: Diagnosis not present

## 2014-03-05 DIAGNOSIS — Z9221 Personal history of antineoplastic chemotherapy: Secondary | ICD-10-CM | POA: Insufficient documentation

## 2014-03-05 DIAGNOSIS — Z853 Personal history of malignant neoplasm of breast: Secondary | ICD-10-CM | POA: Insufficient documentation

## 2014-03-05 DIAGNOSIS — Z79899 Other long term (current) drug therapy: Secondary | ICD-10-CM | POA: Insufficient documentation

## 2014-03-05 DIAGNOSIS — I2510 Atherosclerotic heart disease of native coronary artery without angina pectoris: Secondary | ICD-10-CM | POA: Diagnosis not present

## 2014-03-05 DIAGNOSIS — E785 Hyperlipidemia, unspecified: Secondary | ICD-10-CM | POA: Insufficient documentation

## 2014-03-05 DIAGNOSIS — E119 Type 2 diabetes mellitus without complications: Secondary | ICD-10-CM | POA: Insufficient documentation

## 2014-03-05 NOTE — Progress Notes (Signed)
Consult Note: Gyn-Onc  [place] D Alderman 73 y.o. female  CC:  No chief complaint on file.   HPI: Ms. 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 That was a new diagnosis. She subsequently underwent bilateral groin excisions and re-excision of the vulva in September 2011. All lymph nodes were negative and the re-excision site showed no residual carcinoma. I last saw her in March 2015, at which time her exam was unremarkable. There was no evidence of recurrent disease. She comes in today for followup.   She had a routine screening mammography on August 26, 2012 it showed a possible distortion in the right breast. Additional views April 3 confirmed a spiculated mass in the right breast measuring 9 mm. It was not palpable. She underwent biopsy on 09/04/2012 that revealed an invasive ductal carcinoma, grade 3, triple negative. The patient then had 4 cycles of chemotherapy with docetaxel and Cytoxan given in the adjuvant setting. She completed that as well as radiation therapy to the right breast.  Interval History:  She is overall doing fairly well. She thought that she had Stevens-Johnson syndrome and is very worried about this. She thought she had this as she received a form from her pharmacy stating that Stevens-Johnson syndrome was a side effect of Bactrim. She was initially placed on ciprofloxacin for urinary tract infection by her primary care physician. They then called in and stated that she needed to change to Bactrim. She states that her symptoms have now improved. She never had any symptoms consistent with Stevens-Johnson's. She is complaining of pain in her left groin. She states has been going on for about 6 months at increased over that period of time. She stay she's not discussed this with her primary care physician. She states it is primarily when she's in the sitting position. As we go up at  night which she moves. The pain does radiate into the groin as well as down the lateral aspect of her left leg. She scheduled for followup mammography in April of 2016. She denies any vulvar vaginal pain or discomfort. She denies any vulvar bleeding or vaginal bleeding or vaginal discharge. There's no change in her bowel or bladder habits.  Review of Systems:   Constitutional: As above  Denies fever. Skin: No rash Cardiovascular: No chest pain, shortness of breath, or edema  Pulmonary: No shortness of breath Gastro Intestinal:  No nausea, vomiting, constipation, or diarrhea reported. No bright red blood per rectum. Genitourinary: No frequency, urgency, or dysuria.  Denies vaginal bleeding and discharge. No vulvar lesions or pain. Musculoskeletal: No myalgia, arthralgia, joint swelling or pain.  Neurologic:+ pain left groin that radiates to medial thigh and down lateral aspect of her left leg. Psychology: Doing well    Current Meds:  Outpatient Encounter Prescriptions as of 03/05/2014  Medication Sig  . dabigatran (PRADAXA) 75 MG CAPS Take 75 mg by mouth every 12 (twelve) hours.  . ferrous sulfate 325 (65 FE) MG tablet Take 325 mg by mouth daily with breakfast.  . FLUoxetine (PROZAC) 10 MG capsule Take 10 mg by mouth daily.  [name] Kitchen gabapentin (NEURONTIN) 100 MG capsule 1 tab by mouth in morning, 1 tab by mouth mid-day, and 3 tabs by mouth at bedtime  . HYDROcodone-acetaminophen (NORCO/VICODIN) 5-325 MG per tablet Take 1-2 tablets by mouth every 4 (four) hours as needed for pain.  [name] Kitchen KLOR-CON M20 20 MEQ tablet   .  metFORMIN (GLUCOPHAGE-XR) 500 MG 24 hr tablet Take 500 mg by mouth 2 (two) times daily. Total dose of 1000mg  daily  . nebivolol (BYSTOLIC) 5 MG tablet Take 5 mg by mouth daily.  [name] Kitchen NITROSTAT 0.4 MG SL tablet   . pravastatin (PRAVACHOL) 40 MG tablet Take 40 mg by mouth every evening.  . traMADol (ULTRAM) 50 MG tablet   . traZODone (DESYREL) 50 MG tablet Take 50 mg by mouth at bedtime.   . triamterene-hydrochlorothiazide (DYAZIDE) 37.5-25 MG per capsule     Allergy:  Allergies  Allergen Reactions  . Shellfish Allergy Swelling    Social Hx:   History   Social History  . Marital Status: Widowed    Spouse Name: N/A    Number of Children: N/A  . Years of Education: N/A   Occupational History  . Not on file.   Social History Main Topics  . Smoking status: Former Smoker -- 50 years    Types: Cigarettes    Quit date: 05/29/2009  . Smokeless tobacco: Never Used  . Alcohol Use: No  . Drug Use: No  . Sexual Activity: Not on file   Other Topics Concern  . Not on file   Social History Narrative  . No narrative on file    Past Surgical Hx:  Past Surgical History  Procedure Laterality Date  . Wide local excision  01/2010    Bilateral groin excisions, re-excision of vulva  . Posterior lumbar arthrodesis, pedicle screw fixation, posterolateral arthodesis  03/17/11  . Tonsillectomy    . Abdominal hysterectomy    . Dilation and curettage of uterus    . Tubal ligation    . Colonoscopy    . Shoulder arthroscopy with rotator cuff repair and subacromial decompression  06/04/2012    Procedure: SHOULDER ARTHROSCOPY WITH ROTATOR CUFF REPAIR AND SUBACROMIAL DECOMPRESSION;  Surgeon: Nita Sells, MD;  Location: [place];  Service: Orthopedics;  Laterality: Left;  LEFT SHOULDER ARTHROSCOPY WITH ROTATOR CUFF REPAIR AND SUBACROMIAL DECOMPRESSION AND DISTAL CLAVICLE RESECTION  . Partial mastectomy with needle localization and axillary sentinel lymph node bx Right 10/08/2012    Procedure: RIGHT PARTIAL MASTECTOMY WITH NEEDLE LOCALIZATION AND RIGHT AXILLARY SENTINEL LYMPH NODE BX;  Surgeon: Adin Hector, MD;  Location: [facility];  Service: General;  Laterality: Right;  Injection of 1% Methylene Blue dye into right breast  . Portacath placement Left 10/08/2012    Procedure: INSERTION PORT-A-CATH WITH ULTRASOUND GUIDANCE;  Surgeon: Adin Hector, MD;   Location: [place];  Service: General;  Laterality: Left;  Using 30F [place]  . Vulvectomy partial      Past Medical Hx:  Past Medical History  Diagnosis Date  . Lower back pain   . Herniated disc   . Arthritis   . Lumbar stenosis     L4-5  . Spondylolysis   . Coronary artery disease   . Hypertension   . Hyperlipemia   . 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 Breast cancer   . Depression   . Dysrhythmia     afib  . Shortness of breath     exertion  . Anxiety   . Diabetes mellitus without complication     diet  . Headache(784.0)   . Status post chemotherapy 11/04/2012 - 01/06/2013.    Docetaxel/Cytoxan Q 3 Weeks x 4 cycles  . S/P radiation therapy 01/23/2013-03/06/2013    Right breast / 46 Gy in 23 fractions /  Right breast boost / 14 Gy in 7 fractions    Family Hx:  Family History  Problem Relation Age of Onset  . Stroke Mother   . Alzheimer's disease Mother   . Heart attack Father   . Heart attack Brother     Vitals:  Blood pressure 152/58, pulse 50, temperature 98.2 F (36.8 C), temperature source Oral, resp. rate 18, height 5' 7.5" (1.715 m), weight 202 lb 12.8 oz (91.989 kg).  Physical Exam:   GENERAL: Well-nourished, well-developed female in no acute distress.   NECK: Supple. There is no lymphadenopathy, no thyromegaly.   LUNGS: Clear to auscultation bilaterally.   CARDIOVASCULAR: Regular rate and rhythm.   ABDOMEN: Soft, nontender, nondistended. No palpable masses or hepatosplenomegaly. Groins are negative for adenopathy. Palpation of left groin does not reveal any masses or hernias.  EXTREMITIES: There is no edema.   PELVIC: External genitalia is notable for surgical excisions. There is no evidence of any visible lesions. Palpation along the vulva and the inner thigh reveals no masses and I cannot reproduce her pain. Bimanual examination reveals no thickening, masses, nodularity in the perineum or the vulva. Speculum exam reveals a  normal appearing vagina. The vaginal cuff is without lesions. Bimanual exam is negative.  Assessment/Plan:  ASSESSMENT: 73 year old with a stage IB squamous cell carcinoma of vulva, who clinically has no evidence of recurrent disease, > 4 (7/11) years out from her diagnosis. I for burning discomfort could be radiating from her back. She is a long history of back pain and back surgery. She will followup with her neurologist regarding this. I do not believe that she has any evidence of recurrent vulvar carcinoma. She'll followup with her other physicians as scheduled.  PLAN: Return to see [place] in 6 months. She will follow up with her other physicians as scheduled.    GEHRIG,PAOLA A., MD 03/05/2014, 1:42 PM  her ORs

## 2014-03-05 NOTE — Patient Instructions (Signed)
Return to clinic in 6 months.

## 2014-03-24 DIAGNOSIS — Z6831 Body mass index (BMI) 31.0-31.9, adult: Secondary | ICD-10-CM | POA: Diagnosis not present

## 2014-03-24 DIAGNOSIS — M5416 Radiculopathy, lumbar region: Secondary | ICD-10-CM | POA: Diagnosis not present

## 2014-03-24 DIAGNOSIS — I10 Essential (primary) hypertension: Secondary | ICD-10-CM | POA: Diagnosis not present

## 2014-03-30 DIAGNOSIS — M5416 Radiculopathy, lumbar region: Secondary | ICD-10-CM | POA: Diagnosis not present

## 2014-04-02 DIAGNOSIS — R200 Anesthesia of skin: Secondary | ICD-10-CM | POA: Diagnosis not present

## 2014-04-02 DIAGNOSIS — M545 Low back pain: Secondary | ICD-10-CM | POA: Diagnosis not present

## 2014-04-02 DIAGNOSIS — M5416 Radiculopathy, lumbar region: Secondary | ICD-10-CM | POA: Diagnosis not present

## 2014-04-06 DIAGNOSIS — M4806 Spinal stenosis, lumbar region: Secondary | ICD-10-CM | POA: Diagnosis not present

## 2014-04-06 DIAGNOSIS — I480 Paroxysmal atrial fibrillation: Secondary | ICD-10-CM | POA: Diagnosis not present

## 2014-04-06 DIAGNOSIS — Z6831 Body mass index (BMI) 31.0-31.9, adult: Secondary | ICD-10-CM | POA: Diagnosis not present

## 2014-04-06 DIAGNOSIS — E785 Hyperlipidemia, unspecified: Secondary | ICD-10-CM | POA: Diagnosis not present

## 2014-04-06 DIAGNOSIS — N189 Chronic kidney disease, unspecified: Secondary | ICD-10-CM | POA: Diagnosis not present

## 2014-04-06 DIAGNOSIS — M5416 Radiculopathy, lumbar region: Secondary | ICD-10-CM | POA: Diagnosis not present

## 2014-04-06 DIAGNOSIS — E119 Type 2 diabetes mellitus without complications: Secondary | ICD-10-CM | POA: Diagnosis not present

## 2014-04-06 DIAGNOSIS — I340 Nonrheumatic mitral (valve) insufficiency: Secondary | ICD-10-CM | POA: Diagnosis not present

## 2014-04-06 DIAGNOSIS — I10 Essential (primary) hypertension: Secondary | ICD-10-CM | POA: Diagnosis not present

## 2014-04-07 ENCOUNTER — Encounter: Payer: Self-pay | Admitting: *Deleted

## 2014-04-14 DIAGNOSIS — Z79899 Other long term (current) drug therapy: Secondary | ICD-10-CM | POA: Diagnosis not present

## 2014-04-14 DIAGNOSIS — I481 Persistent atrial fibrillation: Secondary | ICD-10-CM | POA: Diagnosis not present

## 2014-04-14 DIAGNOSIS — Z23 Encounter for immunization: Secondary | ICD-10-CM | POA: Diagnosis not present

## 2014-04-14 DIAGNOSIS — I10 Essential (primary) hypertension: Secondary | ICD-10-CM | POA: Diagnosis not present

## 2014-04-14 DIAGNOSIS — R51 Headache: Secondary | ICD-10-CM | POA: Diagnosis not present

## 2014-04-14 DIAGNOSIS — M4806 Spinal stenosis, lumbar region: Secondary | ICD-10-CM | POA: Diagnosis not present

## 2014-04-14 DIAGNOSIS — Z0000 Encounter for general adult medical examination without abnormal findings: Secondary | ICD-10-CM | POA: Diagnosis not present

## 2014-04-14 DIAGNOSIS — Z1389 Encounter for screening for other disorder: Secondary | ICD-10-CM | POA: Diagnosis not present

## 2014-04-14 DIAGNOSIS — E119 Type 2 diabetes mellitus without complications: Secondary | ICD-10-CM | POA: Diagnosis not present

## 2014-04-16 DIAGNOSIS — E1165 Type 2 diabetes mellitus with hyperglycemia: Secondary | ICD-10-CM | POA: Diagnosis not present

## 2014-04-16 DIAGNOSIS — M5416 Radiculopathy, lumbar region: Secondary | ICD-10-CM | POA: Diagnosis not present

## 2014-04-16 DIAGNOSIS — M4806 Spinal stenosis, lumbar region: Secondary | ICD-10-CM | POA: Diagnosis not present

## 2014-04-16 DIAGNOSIS — I4891 Unspecified atrial fibrillation: Secondary | ICD-10-CM | POA: Diagnosis not present

## 2014-04-16 DIAGNOSIS — I4892 Unspecified atrial flutter: Secondary | ICD-10-CM | POA: Diagnosis not present

## 2014-04-22 DIAGNOSIS — M5416 Radiculopathy, lumbar region: Secondary | ICD-10-CM | POA: Diagnosis not present

## 2014-04-22 DIAGNOSIS — M4806 Spinal stenosis, lumbar region: Secondary | ICD-10-CM | POA: Diagnosis not present

## 2014-05-05 ENCOUNTER — Ambulatory Visit: Payer: Medicare Other

## 2014-05-12 ENCOUNTER — Ambulatory Visit (INDEPENDENT_AMBULATORY_CARE_PROVIDER_SITE_OTHER): Payer: Medicare Other

## 2014-05-12 DIAGNOSIS — M79673 Pain in unspecified foot: Secondary | ICD-10-CM | POA: Diagnosis not present

## 2014-05-12 DIAGNOSIS — B351 Tinea unguium: Secondary | ICD-10-CM | POA: Diagnosis not present

## 2014-05-12 DIAGNOSIS — E1140 Type 2 diabetes mellitus with diabetic neuropathy, unspecified: Secondary | ICD-10-CM

## 2014-05-12 NOTE — Progress Notes (Signed)
   Subjective:    Patient ID: Diane [name], female    DOB: May 11, 1941, 73 y.o.   MRN: [id number]  HPI Pt presents for nail debridement   Review of Systems no new findings or systemic changes noted    Objective:   Physical Exam Neurovascular status is intact pedal pulses DP and PT plus one over 4 Refill time 3 seconds epicritic and proprioceptive sensations intact although patient does have some tingling burning or stinging and bottom are feet is taking the Neurontin patient is advised this is likely associated with her diabetic neuropathy patient Kaser sugars have been fluctuating somewhat recently. Orthopedic exam otherwise unremarkable noncontributory rectus foot type no significant keratoses no open wounds or ulcers are noted       Assessment & Plan:  Assessment this time his diabetes with peripheral neuropathy and paresthesia thick brittle crumbly friable mycotic nails 1 through 5 bilateral debrided at this time in the future as needed for follow-up and continued palliative care every 3 months  [name]

## 2014-05-13 DIAGNOSIS — M5416 Radiculopathy, lumbar region: Secondary | ICD-10-CM | POA: Diagnosis not present

## 2014-05-13 DIAGNOSIS — M4806 Spinal stenosis, lumbar region: Secondary | ICD-10-CM | POA: Diagnosis not present

## 2014-06-01 DIAGNOSIS — S80861A Insect bite (nonvenomous), right lower leg, initial encounter: Secondary | ICD-10-CM | POA: Diagnosis not present

## 2014-06-01 DIAGNOSIS — S80862A Insect bite (nonvenomous), left lower leg, initial encounter: Secondary | ICD-10-CM | POA: Diagnosis not present

## 2014-06-29 DIAGNOSIS — E780 Pure hypercholesterolemia: Secondary | ICD-10-CM | POA: Diagnosis not present

## 2014-06-29 DIAGNOSIS — E119 Type 2 diabetes mellitus without complications: Secondary | ICD-10-CM | POA: Diagnosis not present

## 2014-06-29 DIAGNOSIS — I4891 Unspecified atrial fibrillation: Secondary | ICD-10-CM | POA: Diagnosis not present

## 2014-06-29 DIAGNOSIS — I10 Essential (primary) hypertension: Secondary | ICD-10-CM | POA: Diagnosis not present

## 2014-07-06 DIAGNOSIS — I351 Nonrheumatic aortic (valve) insufficiency: Secondary | ICD-10-CM | POA: Diagnosis not present

## 2014-07-06 DIAGNOSIS — I10 Essential (primary) hypertension: Secondary | ICD-10-CM | POA: Diagnosis not present

## 2014-07-06 DIAGNOSIS — I340 Nonrheumatic mitral (valve) insufficiency: Secondary | ICD-10-CM | POA: Diagnosis not present

## 2014-07-06 DIAGNOSIS — E119 Type 2 diabetes mellitus without complications: Secondary | ICD-10-CM | POA: Diagnosis not present

## 2014-07-06 DIAGNOSIS — H25011 Cortical age-related cataract, right eye: Secondary | ICD-10-CM | POA: Diagnosis not present

## 2014-07-06 DIAGNOSIS — E785 Hyperlipidemia, unspecified: Secondary | ICD-10-CM | POA: Diagnosis not present

## 2014-07-06 DIAGNOSIS — M1990 Unspecified osteoarthritis, unspecified site: Secondary | ICD-10-CM | POA: Diagnosis not present

## 2014-07-06 DIAGNOSIS — H25012 Cortical age-related cataract, left eye: Secondary | ICD-10-CM | POA: Diagnosis not present

## 2014-07-06 DIAGNOSIS — I480 Paroxysmal atrial fibrillation: Secondary | ICD-10-CM | POA: Diagnosis not present

## 2014-07-06 DIAGNOSIS — H2513 Age-related nuclear cataract, bilateral: Secondary | ICD-10-CM | POA: Diagnosis not present

## 2014-07-06 DIAGNOSIS — E669 Obesity, unspecified: Secondary | ICD-10-CM | POA: Diagnosis not present

## 2014-07-06 DIAGNOSIS — N189 Chronic kidney disease, unspecified: Secondary | ICD-10-CM | POA: Diagnosis not present

## 2014-07-08 DIAGNOSIS — M5416 Radiculopathy, lumbar region: Secondary | ICD-10-CM | POA: Diagnosis not present

## 2014-07-08 DIAGNOSIS — M4806 Spinal stenosis, lumbar region: Secondary | ICD-10-CM | POA: Diagnosis not present

## 2014-07-29 ENCOUNTER — Other Ambulatory Visit: Payer: Self-pay | Admitting: General Surgery

## 2014-07-29 ENCOUNTER — Other Ambulatory Visit: Payer: Self-pay

## 2014-07-29 DIAGNOSIS — Z853 Personal history of malignant neoplasm of breast: Secondary | ICD-10-CM

## 2014-07-29 DIAGNOSIS — Z1231 Encounter for screening mammogram for malignant neoplasm of breast: Secondary | ICD-10-CM

## 2014-07-29 DIAGNOSIS — Z9889 Other specified postprocedural states: Secondary | ICD-10-CM

## 2014-08-18 ENCOUNTER — Ambulatory Visit (INDEPENDENT_AMBULATORY_CARE_PROVIDER_SITE_OTHER): Payer: Medicare Other

## 2014-08-18 VITALS — BP 138/69 | HR 72 | Resp 12

## 2014-08-18 DIAGNOSIS — M79673 Pain in unspecified foot: Secondary | ICD-10-CM

## 2014-08-18 DIAGNOSIS — B351 Tinea unguium: Secondary | ICD-10-CM | POA: Diagnosis not present

## 2014-08-18 DIAGNOSIS — E1140 Type 2 diabetes mellitus with diabetic neuropathy, unspecified: Secondary | ICD-10-CM | POA: Diagnosis not present

## 2014-08-18 NOTE — Progress Notes (Signed)
   Subjective:    Patient ID: Diane [name], female    DOB: 28-Feb-1941, 74 y.o.   MRN: [id number]  HPI  TOENAILS TRIM.  Review of Systems no new findings or systemic changes noted Patient did have a complaint of some cramping at bedtime which may be associated with dehydration and her diabetes may suggestion of staying hydrated well consider a sugar-free Gatorade tightfitting to replenish her electrolytes of sodium and potassium etc.    Objective:   Physical Exam Lower extremity objective findings unchanged pedal pulses palpable DP and PT one over 4 bilateral Refill time 3 seconds all digits patient also taking her medications for her diabetes and Neurontin patient does have diabetic neuropathy however indicates she has had some fluctuation sugar whenever sugars recently in the 200 ranges. No open wounds no ulcers no secondary infections rectus foot with mild digital contractures noted no keratoses nails thick brittle criptotic incurvated friable 1 through 5 bilateral       Assessment & Plan:  Assessment diabetes history peripheral neuropathy and some paresthesias as well as some cramping at night posse some dehydration at this time maintain good hydration painful friable mycotic nails debrided 1 through 5 bilateral return for future palliative care every 3 months as recommended  [name]

## 2014-08-26 ENCOUNTER — Other Ambulatory Visit: Payer: Self-pay | Admitting: General Surgery

## 2014-08-26 DIAGNOSIS — Z9889 Other specified postprocedural states: Secondary | ICD-10-CM

## 2014-08-26 DIAGNOSIS — Z853 Personal history of malignant neoplasm of breast: Secondary | ICD-10-CM

## 2014-08-31 ENCOUNTER — Ambulatory Visit
Admission: RE | Admit: 2014-08-31 | Discharge: 2014-08-31 | Disposition: A | Discharge status: Home / Self Care | Payer: Medicare Other | Source: Ambulatory Visit

## 2014-08-31 DIAGNOSIS — Z853 Personal history of malignant neoplasm of breast: Secondary | ICD-10-CM

## 2014-08-31 DIAGNOSIS — Z9889 Other specified postprocedural states: Secondary | ICD-10-CM

## 2014-08-31 DIAGNOSIS — R928 Other abnormal and inconclusive findings on diagnostic imaging of breast: Secondary | ICD-10-CM | POA: Diagnosis not present

## 2014-09-03 DIAGNOSIS — E1165 Type 2 diabetes mellitus with hyperglycemia: Secondary | ICD-10-CM | POA: Diagnosis not present

## 2014-09-03 DIAGNOSIS — C519 Malignant neoplasm of vulva, unspecified: Secondary | ICD-10-CM | POA: Diagnosis not present

## 2014-09-03 DIAGNOSIS — I10 Essential (primary) hypertension: Secondary | ICD-10-CM | POA: Diagnosis not present

## 2014-09-03 DIAGNOSIS — I2510 Atherosclerotic heart disease of native coronary artery without angina pectoris: Secondary | ICD-10-CM | POA: Diagnosis not present

## 2014-09-03 DIAGNOSIS — R739 Hyperglycemia, unspecified: Secondary | ICD-10-CM | POA: Diagnosis not present

## 2014-09-03 DIAGNOSIS — E119 Type 2 diabetes mellitus without complications: Secondary | ICD-10-CM | POA: Diagnosis not present

## 2014-09-03 DIAGNOSIS — R8290 Unspecified abnormal findings in urine: Secondary | ICD-10-CM | POA: Diagnosis not present

## 2014-09-03 DIAGNOSIS — C50411 Malignant neoplasm of upper-outer quadrant of right female breast: Secondary | ICD-10-CM | POA: Diagnosis not present

## 2014-09-03 DIAGNOSIS — R011 Cardiac murmur, unspecified: Secondary | ICD-10-CM | POA: Diagnosis not present

## 2014-09-07 DIAGNOSIS — H25011 Cortical age-related cataract, right eye: Secondary | ICD-10-CM | POA: Diagnosis not present

## 2014-09-07 DIAGNOSIS — H25013 Cortical age-related cataract, bilateral: Secondary | ICD-10-CM | POA: Diagnosis not present

## 2014-09-07 DIAGNOSIS — H2511 Age-related nuclear cataract, right eye: Secondary | ICD-10-CM | POA: Diagnosis not present

## 2014-09-07 DIAGNOSIS — H2513 Age-related nuclear cataract, bilateral: Secondary | ICD-10-CM | POA: Diagnosis not present

## 2014-09-10 ENCOUNTER — Encounter (HOSPITAL_COMMUNITY): Payer: Self-pay | Admitting: Emergency Medicine

## 2014-09-10 ENCOUNTER — Inpatient Hospital Stay (HOSPITAL_COMMUNITY)
Admission: EM | Admit: 2014-09-10 | Discharge: 2014-09-15 | DRG: 287 | Disposition: A | Discharge status: Home / Self Care | Payer: Medicare Other | Attending: Cardiology | Admitting: Cardiology

## 2014-09-10 DIAGNOSIS — T50995A Adverse effect of other drugs, medicaments and biological substances, initial encounter: Secondary | ICD-10-CM | POA: Diagnosis not present

## 2014-09-10 DIAGNOSIS — R001 Bradycardia, unspecified: Secondary | ICD-10-CM | POA: Diagnosis present

## 2014-09-10 DIAGNOSIS — I10 Essential (primary) hypertension: Secondary | ICD-10-CM | POA: Diagnosis present

## 2014-09-10 DIAGNOSIS — D509 Iron deficiency anemia, unspecified: Secondary | ICD-10-CM | POA: Diagnosis not present

## 2014-09-10 DIAGNOSIS — Z9011 Acquired absence of right breast and nipple: Secondary | ICD-10-CM | POA: Diagnosis present

## 2014-09-10 DIAGNOSIS — E119 Type 2 diabetes mellitus without complications: Secondary | ICD-10-CM | POA: Diagnosis not present

## 2014-09-10 DIAGNOSIS — Z9221 Personal history of antineoplastic chemotherapy: Secondary | ICD-10-CM

## 2014-09-10 DIAGNOSIS — Z7902 Long term (current) use of antithrombotics/antiplatelets: Secondary | ICD-10-CM

## 2014-09-10 DIAGNOSIS — Z87891 Personal history of nicotine dependence: Secondary | ICD-10-CM

## 2014-09-10 DIAGNOSIS — I25110 Atherosclerotic heart disease of native coronary artery with unstable angina pectoris: Principal | ICD-10-CM | POA: Diagnosis present

## 2014-09-10 DIAGNOSIS — I482 Chronic atrial fibrillation: Secondary | ICD-10-CM | POA: Diagnosis present

## 2014-09-10 DIAGNOSIS — Z91013 Allergy to seafood: Secondary | ICD-10-CM

## 2014-09-10 DIAGNOSIS — I209 Angina pectoris, unspecified: Secondary | ICD-10-CM | POA: Diagnosis not present

## 2014-09-10 DIAGNOSIS — E876 Hypokalemia: Secondary | ICD-10-CM | POA: Diagnosis present

## 2014-09-10 DIAGNOSIS — Z8249 Family history of ischemic heart disease and other diseases of the circulatory system: Secondary | ICD-10-CM

## 2014-09-10 DIAGNOSIS — R079 Chest pain, unspecified: Secondary | ICD-10-CM | POA: Diagnosis present

## 2014-09-10 DIAGNOSIS — Z853 Personal history of malignant neoplasm of breast: Secondary | ICD-10-CM

## 2014-09-10 DIAGNOSIS — F329 Major depressive disorder, single episode, unspecified: Secondary | ICD-10-CM | POA: Diagnosis present

## 2014-09-10 DIAGNOSIS — E780 Pure hypercholesterolemia: Secondary | ICD-10-CM | POA: Diagnosis present

## 2014-09-10 DIAGNOSIS — Z923 Personal history of irradiation: Secondary | ICD-10-CM

## 2014-09-10 LAB — CBC
HCT: 33 % — ABNORMAL LOW (ref 36.0–46.0)
Hemoglobin: 10.9 g/dL — ABNORMAL LOW (ref 12.0–15.0)
MCH: 26.9 pg (ref 26.0–34.0)
MCHC: 33 g/dL (ref 30.0–36.0)
MCV: 81.5 fL (ref 78.0–100.0)
Platelets: 187 10*3/uL (ref 150–400)
RBC: 4.05 MIL/uL (ref 3.87–5.11)
RDW: 14.3 % (ref 11.5–15.5)
WBC: 5.5 10*3/uL (ref 4.0–10.5)

## 2014-09-10 LAB — I-STAT TROPONIN, ED: Troponin i, poc: 0 ng/mL (ref 0.00–0.08)

## 2014-09-10 NOTE — ED Notes (Signed)
Pt reports intermittent L sided cp that started this evening. sts she feels nauseated and feels like her bra is tight. Pt took nitro without relief.

## 2014-09-10 NOTE — ED Provider Notes (Signed)
CSN: [id number]     Arrival date & time 09/10/14  2312 History  This chart was scribed for Charlesetta Shanks, MD by Delphia Grates, ED Scribe. This patient was seen in room D35C/D35C and the patient's care was started at 12:01 AM.   Chief Complaint  Patient presents with  . Chest Pain    The history is provided by the patient. No language interpreter was used.    HPI Comments: Diane [name] is a 74 y.o. female, with history of DM, HTN, HLD, A-fib, and CAD, who presents to the Emergency Department complaining of intermittent left sided (under left breast) and central chest pain that began 8 hours ago. Patient states she was sitting at the doctor's office with her daughter when the pain started. There is associated SOB, nausea, and chest tightness (as if her "bra is tight"). She reports history of chest pain, but states this does not feel similar. Patient states she typically takes nitroglycerin for her chest pain, but states it had no effect on the current episodes. Patient is established with a cardiologist, Dr. Terrence Dupont, and has f/u every 3 months, with her next appointment in approximately 3 weeks (Oct 05, 2014). Patient also mentions left leg swelling for the past week. She denies LOC, fever, chills, or cold symptoms.   Past Medical History  Diagnosis Date  . Lower back pain   . Herniated disc   . Arthritis   . Lumbar stenosis     L4-5  . Spondylolysis   . Coronary artery disease   . Hypertension   . Hyperlipemia   . 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 Breast cancer   . Depression   . Dysrhythmia     afib  . Shortness of breath     exertion  . Anxiety   . Diabetes mellitus without complication     diet  . Headache(784.0)   . Status post chemotherapy 11/04/2012 - 01/06/2013.    Docetaxel/Cytoxan Q 3 Weeks x 4 cycles  . S/P radiation therapy 01/23/2013-03/06/2013    Right breast / 46 Gy in 23 fractions / Right breast boost / 14 Gy in 7 fractions  . Atrial  fibrillation   . Iron deficiency anemia   . Fibromyalgia   . Torn rotator cuff    Past Surgical History  Procedure Laterality Date  . Wide local excision  01/2010    Bilateral groin excisions, re-excision of vulva  . Posterior lumbar arthrodesis, pedicle screw fixation, posterolateral arthodesis  03/17/11  . Tonsillectomy    . Abdominal hysterectomy    . Dilation and curettage of uterus    . Tubal ligation    . Colonoscopy    . Shoulder arthroscopy with rotator cuff repair and subacromial decompression  06/04/2012    Procedure: SHOULDER ARTHROSCOPY WITH ROTATOR CUFF REPAIR AND SUBACROMIAL DECOMPRESSION;  Surgeon: Nita Sells, MD;  Location: [place];  Service: Orthopedics;  Laterality: Left;  LEFT SHOULDER ARTHROSCOPY WITH ROTATOR CUFF REPAIR AND SUBACROMIAL DECOMPRESSION AND DISTAL CLAVICLE RESECTION  . Partial mastectomy with needle localization and axillary sentinel lymph node bx Right 10/08/2012    Procedure: RIGHT PARTIAL MASTECTOMY WITH NEEDLE LOCALIZATION AND RIGHT AXILLARY SENTINEL LYMPH NODE BX;  Surgeon: Adin Hector, MD;  Location: [place];  Service: General;  Laterality: Right;  Injection of 1% Methylene Blue dye into right breast  . Portacath placement Left 10/08/2012    Procedure: INSERTION PORT-A-CATH WITH ULTRASOUND GUIDANCE;  Surgeon:  Adin Hector, MD;  Location: [place] [facility];  Service: General;  Laterality: Left;  Using 75F [place]  . Vulvectomy partial     Family History  Problem Relation Age of Onset  . Stroke Mother   . Alzheimer's disease Mother   . Heart attack Father   . Heart attack Brother    History  Substance Use Topics  . Smoking status: Former Smoker -- 50 years    Types: Cigarettes    Quit date: 05/29/2009  . Smokeless tobacco: Never Used  . Alcohol Use: No   OB History    No data available     Review of Systems  A complete 10 system review of systems was obtained and all systems are negative except as noted  in the HPI and PMH. .   Allergies  Shellfish allergy  Home Medications   Prior to Admission medications   Medication Sig Start Date End Date Taking? Authorizing Provider  dabigatran (PRADAXA) 75 MG CAPS Take 75 mg by mouth every 12 (twelve) hours.   Yes Historical Provider, MD  FLUoxetine (PROZAC) 10 MG capsule Take 10 mg by mouth daily.   Yes Historical Provider, MD  HYDROcodone-acetaminophen (NORCO/VICODIN) 5-325 MG per tablet Take 1-2 tablets by mouth every 4 (four) hours as needed for pain. 10/08/12  Yes [name], MD  metFORMIN (GLUCOPHAGE-XR) 500 MG 24 hr tablet Take 500 mg by mouth daily with breakfast. Total dose of 1000mg  daily 10/10/13  Yes Historical Provider, MD  nebivolol (BYSTOLIC) 5 MG tablet Take 5 mg by mouth daily.   Yes Historical Provider, MD  NITROSTAT 0.4 MG SL tablet Place 0.4 mg under the tongue every 5 (five) minutes as needed for chest pain.  10/14/12  Yes Historical Provider, MD  pravastatin (PRAVACHOL) 40 MG tablet Take 40 mg by mouth every evening.   Yes Historical Provider, MD  traZODone (DESYREL) 50 MG tablet Take 50 mg by mouth at bedtime.   Yes Historical Provider, MD  triamterene-hydrochlorothiazide (DYAZIDE) 37.5-25 MG per capsule Take 1 capsule by mouth daily.  01/29/13  Yes Historical Provider, MD  ferrous sulfate 325 (65 FE) MG tablet Take 325 mg by mouth daily with breakfast.    Historical Provider, MD  gabapentin (NEURONTIN) 100 MG capsule 1 tab by mouth in morning, 1 tab by mouth mid-day, and 3 tabs by mouth at bedtime Patient not taking: Reported on 09/11/2014 03/05/13   Amy Milda Smart, PA-C   Triage Vitals: BP 163/54 mmHg  Pulse 75  Temp(Src) 97.7 F (36.5 C) (Oral)  Resp 18  Ht 5\' 7"  (1.702 m)  Wt 202 lb (91.627 kg)  BMI 31.63 kg/m2  SpO2 98%  Physical Exam  Constitutional: She is oriented to person, place, and time. She appears well-developed and well-nourished. No distress.  HENT:  Head: Normocephalic and atraumatic.  Eyes: Conjunctivae  and EOM are normal.  Neck: Neck supple. No tracheal deviation present.  Cardiovascular: Normal rate.   Pulmonary/Chest: Effort normal. No respiratory distress.  Musculoskeletal: Normal range of motion.  Neurological: She is alert and oriented to person, place, and time.  Skin: Skin is warm and dry.  Psychiatric: She has a normal mood and affect. Her behavior is normal.  Nursing note and vitals reviewed.   ED Course  Procedures (including critical care time)  DIAGNOSTIC STUDIES: Oxygen Saturation is 98% on room air, normal by my interpretation.    COORDINATION OF CARE: At 0008 Discussed treatment plan with patient which includes labs. Patient agrees.  Labs Review Labs Reviewed  CBC - Abnormal; Notable for the following:    Hemoglobin 10.9 (*)    HCT 33.0 (*)    All other components within normal limits  BASIC METABOLIC PANEL - Abnormal; Notable for the following:    Sodium 134 (*)    Potassium 3.3 (*)    Glucose, Bld 114 (*)    BUN 31 (*)    Creatinine, Ser 1.55 (*)    GFR calc non Af Amer 33 (*)    GFR calc Af Amer 38 (*)    All other components within normal limits  D-DIMER, QUANTITATIVE  HEPARIN LEVEL (UNFRACTIONATED)  I-STAT TROPOININ, ED    Imaging Review No results found.   EKG Interpretation   Date/Time:  Thursday September 10 2014 23:18:31 EDT Ventricular Rate:  53 PR Interval:    QRS Duration: 88 QT Interval:  448 QTC Calculation: 420 R Axis:   13 Text Interpretation:  Undetermined rhythm Possible Anterior infarct , age  undetermined Abnormal ECG agree. no STEMI Confirmed by Johnney Killian, MD, Jeannie Done  [phone]) on 09/10/2014 11:59:36 PM      MDM   Final diagnoses:  Ischemic chest pain   The patient had chest pain present that did continue. At this time the EKG does not show an acute MI and the cardiac enzymes are not elevated. However she does have known coronary artery disease and multiple risk factors. The patient's case has been reviewed with Dr.  Terrence Dupont, he advises to start the patient on heparin and nitroglycerin drips at this time with admission for further evaluation.   Charlesetta Shanks, MD 09/11/14 [phone]

## 2014-09-11 ENCOUNTER — Other Ambulatory Visit (HOSPITAL_COMMUNITY): Payer: Self-pay

## 2014-09-11 DIAGNOSIS — I10 Essential (primary) hypertension: Secondary | ICD-10-CM | POA: Diagnosis not present

## 2014-09-11 DIAGNOSIS — R079 Chest pain, unspecified: Secondary | ICD-10-CM | POA: Diagnosis present

## 2014-09-11 DIAGNOSIS — R001 Bradycardia, unspecified: Secondary | ICD-10-CM | POA: Diagnosis not present

## 2014-09-11 DIAGNOSIS — E119 Type 2 diabetes mellitus without complications: Secondary | ICD-10-CM | POA: Diagnosis not present

## 2014-09-11 DIAGNOSIS — I209 Angina pectoris, unspecified: Secondary | ICD-10-CM | POA: Diagnosis not present

## 2014-09-11 LAB — BASIC METABOLIC PANEL
Anion gap: 12 (ref 5–15)
BUN: 31 mg/dL — ABNORMAL HIGH (ref 6–23)
CO2: 24 mmol/L (ref 19–32)
Calcium: 8.6 mg/dL (ref 8.4–10.5)
Chloride: 98 mmol/L (ref 96–112)
Creatinine, Ser: 1.55 mg/dL — ABNORMAL HIGH (ref 0.50–1.10)
GFR calc Af Amer: 38 mL/min — ABNORMAL LOW (ref >90)
GFR calc non Af Amer: 33 mL/min — ABNORMAL LOW (ref >90)
Glucose, Bld: 114 mg/dL — ABNORMAL HIGH (ref 70–99)
Potassium: 3.3 mmol/L — ABNORMAL LOW (ref 3.5–5.1)
Sodium: 134 mmol/L — ABNORMAL LOW (ref 135–145)

## 2014-09-11 LAB — MRSA PCR SCREENING: MRSA by PCR: NEGATIVE

## 2014-09-11 LAB — GLUCOSE, CAPILLARY
Glucose-Capillary: 99 mg/dL (ref 70–99)
Glucose-Capillary: 169 mg/dL — ABNORMAL HIGH (ref 70–99)

## 2014-09-11 LAB — HEPARIN LEVEL (UNFRACTIONATED)
Heparin Unfractionated: 0.48 IU/mL (ref 0.30–0.70)
Heparin Unfractionated: 0.73 IU/mL — ABNORMAL HIGH (ref 0.30–0.70)

## 2014-09-11 LAB — TROPONIN I
Troponin I: <0.03 ng/mL (ref <0.031)
Troponin I: <0.03 ng/mL (ref <0.031)
Troponin I: <0.03 ng/mL (ref <0.031)
Troponin I: <0.03 ng/mL (ref <0.031)
Troponin I: <0.03 ng/mL (ref <0.031)

## 2014-09-11 LAB — D-DIMER, QUANTITATIVE (NOT AT ARMC): D-Dimer, Quant: <0.27 ug/mL-FEU (ref 0.00–0.48)

## 2014-09-11 MED ORDER — LOSARTAN POTASSIUM 50 MG PO TABS
100.0000 mg | ORAL_TABLET | Freq: Every day | ORAL | Status: DC
  Administered 2014-09-11 – 2014-09-15 (×5): 100 mg via ORAL
  Filled 2014-09-11 (×7): qty 2

## 2014-09-11 MED ORDER — ASPIRIN EC 81 MG PO TBEC
81.0000 mg | DELAYED_RELEASE_TABLET | Freq: Every day | ORAL | Status: DC
  Administered 2014-09-12 – 2014-09-15 (×4): 81 mg via ORAL
  Filled 2014-09-11 (×5): qty 1

## 2014-09-11 MED ORDER — NITROGLYCERIN IN D5W 200-5 MCG/ML-% IV SOLN
10.0000 ug/min | INTRAVENOUS | Status: DC
  Administered 2014-09-11 – 2014-09-12 (×2): 10 ug/min via INTRAVENOUS
  Administered 2014-09-14 (×3): 20 ug/min via INTRAVENOUS
  Filled 2014-09-11: qty 250

## 2014-09-11 MED ORDER — FLUOXETINE HCL 10 MG PO CAPS
10.0000 mg | ORAL_CAPSULE | Freq: Every day | ORAL | Status: DC
  Administered 2014-09-11 – 2014-09-15 (×5): 10 mg via ORAL
  Filled 2014-09-11 (×5): qty 1

## 2014-09-11 MED ORDER — PRAVASTATIN SODIUM 40 MG PO TABS
40.0000 mg | ORAL_TABLET | Freq: Every evening | ORAL | Status: DC
  Administered 2014-09-11 – 2014-09-14 (×4): 40 mg via ORAL
  Filled 2014-09-11 (×5): qty 1

## 2014-09-11 MED ORDER — SODIUM CHLORIDE 0.9 % IV SOLN
INTRAVENOUS | Status: DC
  Administered 2014-09-11 – 2014-09-14 (×3): via INTRAVENOUS

## 2014-09-11 MED ORDER — NITROGLYCERIN 0.4 MG SL SUBL: 0.4000 mg | SUBLINGUAL_TABLET | SUBLINGUAL | Status: DC | PRN

## 2014-09-11 MED ORDER — NITROGLYCERIN IN D5W 200-5 MCG/ML-% IV SOLN
5.0000 ug/min | INTRAVENOUS | Status: DC
  Administered 2014-09-11: 5 ug/min via INTRAVENOUS

## 2014-09-11 MED ORDER — ACETAMINOPHEN 325 MG PO TABS
650.0000 mg | ORAL_TABLET | ORAL | Status: DC | PRN
  Administered 2014-09-11 – 2014-09-14 (×4): 650 mg via ORAL
  Filled 2014-09-11 (×4): qty 2

## 2014-09-11 MED ORDER — NITROGLYCERIN IN D5W 200-5 MCG/ML-% IV SOLN
INTRAVENOUS | Status: AC
  Filled 2014-09-11: qty 250

## 2014-09-11 MED ORDER — ASPIRIN 325 MG PO TABS
325.0000 mg | ORAL_TABLET | Freq: Once | ORAL | Status: AC
  Administered 2014-09-11: 325 mg via ORAL
  Filled 2014-09-11: qty 1

## 2014-09-11 MED ORDER — PANTOPRAZOLE SODIUM 40 MG PO TBEC
40.0000 mg | DELAYED_RELEASE_TABLET | Freq: Every day | ORAL | Status: DC
  Administered 2014-09-11 – 2014-09-15 (×5): 40 mg via ORAL
  Filled 2014-09-11 (×5): qty 1

## 2014-09-11 MED ORDER — POTASSIUM CHLORIDE CRYS ER 20 MEQ PO TBCR
40.0000 meq | EXTENDED_RELEASE_TABLET | Freq: Once | ORAL | Status: AC
  Administered 2014-09-11: 40 meq via ORAL
  Filled 2014-09-11: qty 2

## 2014-09-11 MED ORDER — TRAZODONE HCL 50 MG PO TABS
50.0000 mg | ORAL_TABLET | Freq: Every day | ORAL | Status: DC
  Administered 2014-09-11 – 2014-09-14 (×4): 50 mg via ORAL
  Filled 2014-09-11 (×4): qty 1

## 2014-09-11 MED ORDER — INSULIN ASPART 100 UNIT/ML ~~LOC~~ SOLN
0.0000 [IU] | Freq: Three times a day (TID) | SUBCUTANEOUS | Status: DC
  Administered 2014-09-12: 2 [IU] via SUBCUTANEOUS
  Administered 2014-09-15: 1 [IU] via SUBCUTANEOUS

## 2014-09-11 MED ORDER — HEPARIN (PORCINE) IN NACL 100-0.45 UNIT/ML-% IJ SOLN
800.0000 [IU]/h | INTRAMUSCULAR | Status: DC
  Administered 2014-09-11: 900 [IU]/h via INTRAVENOUS
  Administered 2014-09-13 – 2014-09-14 (×4): 800 [IU]/h via INTRAVENOUS
  Filled 2014-09-11 (×3): qty 250

## 2014-09-11 MED ORDER — HEPARIN BOLUS VIA INFUSION
4000.0000 [IU] | Freq: Once | INTRAVENOUS | Status: AC
  Administered 2014-09-11: 4000 [IU] via INTRAVENOUS
  Filled 2014-09-11: qty 4000

## 2014-09-11 MED ORDER — ONDANSETRON HCL 4 MG/2ML IJ SOLN: 4.0000 mg | Freq: Four times a day (QID) | INTRAMUSCULAR | Status: DC | PRN

## 2014-09-11 MED ORDER — AMLODIPINE BESYLATE 5 MG PO TABS
5.0000 mg | ORAL_TABLET | Freq: Every day | ORAL | Status: DC
Start: 2014-09-11 — End: 2014-09-15
  Administered 2014-09-11 – 2014-09-15 (×5): 5 mg via ORAL
  Filled 2014-09-11 (×5): qty 1

## 2014-09-11 NOTE — ED Notes (Signed)
EDP at bedside  

## 2014-09-11 NOTE — Progress Notes (Signed)
Patient complaining of sharp, left chest pain, non-radiating on arrival to floor.  Not reproducible with deep palpation over chest wall/breast.  Does not increase with deep inspiration.  Not associated with SOB, palpitations, nausea or diaphoresis.  Vital signs stable with NTG gtt at 15 mcg/min and IV heparin infusing at 900 units/hr.  IV NTG increased to 20 mcg/min.  MD paged to make aware.

## 2014-09-11 NOTE — Progress Notes (Signed)
UR completed 

## 2014-09-11 NOTE — Progress Notes (Signed)
ANTICOAGULATION CONSULT NOTE - Follow Up Consult  Pharmacy Consult for heparin Indication: chest pain/ACS  Allergies  Allergen Reactions  . Shellfish Allergy Swelling    Patient Measurements: Height: 5\' 7"  (170.2 cm) Weight: 202 lb 13.2 oz (92 kg) IBW/kg (Calculated) : 61.6 Heparin Dosing Weight: 81.5kg  Vital Signs: Temp: 97.8 F (36.6 C) (04/15 1147) Temp Source: Oral (04/15 1147) BP: 123/46 mmHg (04/15 1147) Pulse Rate: 46 (04/15 1147)  Labs:  Recent Labs  09/10/14 2338 09/11/14 0842 09/11/14 1048  HGB 10.9*  --   --   HCT 33.0*  --   --   PLT 187  --   --   HEPARINUNFRC  --   --  0.48  CREATININE 1.55*  --   --   TROPONINI  --  <0.03 <0.03    Estimated Creatinine Clearance: 38.8 mL/min (by C-G formula based on Cr of 1.55).   Medications:  Scheduled:  . amLODipine  5 mg Oral Daily  . [START ON 09/12/2014] aspirin EC  81 mg Oral Daily  . FLUoxetine  10 mg Oral Daily  . insulin aspart  0-9 Units Subcutaneous TID WC  . losartan  100 mg Oral Daily  . nitroGLYCERIN      . pantoprazole  40 mg Oral Q0600  . pravastatin  40 mg Oral QPM  . traZODone  50 mg Oral QHS   Infusions:  . sodium chloride    . heparin 900 Units/hr (09/11/14 0335)  . nitroGLYCERIN 10 mcg/min (09/11/14 0815)    Assessment: 74 yo female on heparin for CP and initial heparin level is at goal (HL= 0.48) on 900 units/hr. Plans noted for myvoview.   Goal of Therapy:  Heparin level 0.3-0.7 units/ml Monitor platelets by anticoagulation protocol: Yes   Plan:  -No heparin changes needed -Will confirm a heparin level later today -Daily heparin level and CBC  Hildred Laser, Pharm D 09/11/2014 12:43 PM

## 2014-09-11 NOTE — ED Notes (Signed)
Bedside report given to Mickle Plumb by this RN

## 2014-09-11 NOTE — Progress Notes (Signed)
Chest pain free. MD order receipt in progress.

## 2014-09-11 NOTE — Progress Notes (Signed)
Offered Pt a bath. She declined and stated she had got a CHG bath last night and felt as if she already had a bath.

## 2014-09-11 NOTE — Progress Notes (Signed)
ANTICOAGULATION CONSULT NOTE - Initial Consult  Pharmacy Consult for Heparin  Indication: chest pain/ACS  Allergies  Allergen Reactions  . Shellfish Allergy Swelling    Patient Measurements: Height: 5\' 7"  (170.2 cm) Weight: 202 lb (91.627 kg) IBW/kg (Calculated) : 61.6  Vital Signs: Temp: 97.7 F (36.5 C) (04/14 2319) Temp Source: Oral (04/14 2319) BP: 133/49 mmHg (04/15 0015) Pulse Rate: 44 (04/15 0015)  Labs:  Recent Labs  09/10/14 2338  HGB 10.9*  HCT 33.0*  PLT 187  CREATININE 1.55*    Estimated Creatinine Clearance: 38.7 mL/min (by C-G formula based on Cr of 1.55).   Medical History: Past Medical History  Diagnosis Date  . Lower back pain   . Herniated disc   . Arthritis   . Lumbar stenosis     L4-5  . Spondylolysis   . Coronary artery disease   . Hypertension   . Hyperlipemia   . 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 Breast cancer   . Depression   . Dysrhythmia     afib  . Shortness of breath     exertion  . Anxiety   . Diabetes mellitus without complication     diet  . Headache(784.0)   . Status post chemotherapy 11/04/2012 - 01/06/2013.    Docetaxel/Cytoxan Q 3 Weeks x 4 cycles  . S/P radiation therapy 01/23/2013-03/06/2013    Right breast / 46 Gy in 23 fractions / Right breast boost / 14 Gy in 7 fractions  . Atrial fibrillation   . Iron deficiency anemia   . Fibromyalgia   . Torn rotator cuff     Assessment: Heparin for CP, Hgb 10.9, noted renal dysfunction, on Pradaxa PTA with last dose 4/14 AM, ok to start heparin now.   Goal of Therapy:  Heparin level 0.3-0.7 units/ml Monitor platelets by anticoagulation protocol: Yes   Plan:  -Heparin 4000 units BOLUS -Start heparin drip at 900 units/hr -1100 HL -Daily CBC/HL -Monitor for bleeding  Narda Bonds 09/11/2014,2:36 AM

## 2014-09-11 NOTE — H&P (Signed)
Diane [name] is an 74 y.o. female.   Chief Complaint: Recurrent chest pain HPI: Patient is 74 year old female with past medical history significant for coronary artery disease, hypertension, diabetes mellitus controlled by diet, history of recurrent A. fib, and degenerative joint disease, hypercholesteremia, history of CA of breast, history of vulvar CA , positive family history of coronary artery disease, depression, or came to the ER complaining of recurrent chest pain which started yesterday afternoon described as tightness great 6/10 associated with nausea to sublingual nitroglycerin initially without relief and then had recurrent chest pain radiating occasionally to left arm so decided to come to the ED. Patient denies any history of exertional chest pain. Denies PND orthopnea leg swelling denies palpitation lightheadedness or syncope. EKG done in the ED showed marked sinus bradycardia occasional junctional escape rhythm on the monitor and poor R-wave progression in anterior leads. First set of cardiac enzymes are negative.  Past Medical History  Diagnosis Date  . Lower back pain   . Herniated disc   . Arthritis   . Lumbar stenosis     L4-5  . Spondylolysis   . Coronary artery disease   . Hypertension   . Hyperlipemia   . 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 Breast cancer   . Depression   . Dysrhythmia     afib  . Shortness of breath     exertion  . Anxiety   . Diabetes mellitus without complication     diet  . Headache(784.0)   . Status post chemotherapy 11/04/2012 - 01/06/2013.    Docetaxel/Cytoxan Q 3 Weeks x 4 cycles  . S/P radiation therapy 01/23/2013-03/06/2013    Right breast / 46 Gy in 23 fractions / Right breast boost / 14 Gy in 7 fractions  . Atrial fibrillation   . Iron deficiency anemia   . Fibromyalgia   . Torn rotator cuff     Past Surgical History  Procedure Laterality Date  . Wide local excision  01/2010    Bilateral groin excisions,  re-excision of vulva  . Posterior lumbar arthrodesis, pedicle screw fixation, posterolateral arthodesis  03/17/11  . Tonsillectomy    . Abdominal hysterectomy    . Dilation and curettage of uterus    . Tubal ligation    . Colonoscopy    . Shoulder arthroscopy with rotator cuff repair and subacromial decompression  06/04/2012    Procedure: SHOULDER ARTHROSCOPY WITH ROTATOR CUFF REPAIR AND SUBACROMIAL DECOMPRESSION;  Surgeon: Nita Sells, MD;  Location: [facility];  Service: Orthopedics;  Laterality: Left;  LEFT SHOULDER ARTHROSCOPY WITH ROTATOR CUFF REPAIR AND SUBACROMIAL DECOMPRESSION AND DISTAL CLAVICLE RESECTION  . Partial mastectomy with needle localization and axillary sentinel lymph node bx Right 10/08/2012    Procedure: RIGHT PARTIAL MASTECTOMY WITH NEEDLE LOCALIZATION AND RIGHT AXILLARY SENTINEL LYMPH NODE BX;  Surgeon: Adin Hector, MD;  Location: [facility];  Service: General;  Laterality: Right;  Injection of 1% Methylene Blue dye into right breast  . Portacath placement Left 10/08/2012    Procedure: INSERTION PORT-A-CATH WITH ULTRASOUND GUIDANCE;  Surgeon: Adin Hector, MD;  Location: [facility];  Service: General;  Laterality: Left;  Using 51F [place]  . Vulvectomy partial      Family History  Problem Relation Age of Onset  . Stroke Mother   . Alzheimer's disease Mother   . Heart attack Father   . Heart attack Brother    Social History:  reports  that she quit smoking about 5 years ago. Her smoking use included Cigarettes. She quit after 50 years of use. She has never used smokeless tobacco. She reports that she does not drink alcohol or use illicit drugs.  Allergies:  Allergies  Allergen Reactions  . Shellfish Allergy Swelling    Medications Prior to Admission  Medication Sig Dispense Refill  . dabigatran (PRADAXA) 75 MG CAPS Take 75 mg by mouth every 12 (twelve) hours.    [name] Kitchen FLUoxetine (PROZAC) 10 MG capsule Take 10 mg by mouth daily.    [name] Kitchen  HYDROcodone-acetaminophen (NORCO/VICODIN) 5-325 MG per tablet Take 1-2 tablets by mouth every 4 (four) hours as needed for pain. 40 tablet 1  . metFORMIN (GLUCOPHAGE-XR) 500 MG 24 hr tablet Take 500 mg by mouth daily with breakfast. Total dose of 1029m daily    . nebivolol (BYSTOLIC) 5 MG tablet Take 5 mg by mouth daily.    .[name] KitchenNITROSTAT 0.4 MG SL tablet Place 0.4 mg under the tongue every 5 (five) minutes as needed for chest pain.     . pravastatin (PRAVACHOL) 40 MG tablet Take 40 mg by mouth every evening.    . traZODone (DESYREL) 50 MG tablet Take 50 mg by mouth at bedtime.    . triamterene-hydrochlorothiazide (DYAZIDE) 37.5-25 MG per capsule Take 1 capsule by mouth daily.     . ferrous sulfate 325 (65 FE) MG tablet Take 325 mg by mouth daily with breakfast.    . gabapentin (NEURONTIN) 100 MG capsule 1 tab by mouth in morning, 1 tab by mouth mid-day, and 3 tabs by mouth at bedtime (Patient not taking: Reported on 09/11/2014) 150 capsule 3    Results for orders placed or performed during the hospital encounter of 09/10/14 (from the past 48 hour(s))  D-dimer, quantitative     Status: None   Collection Time: 09/10/14 11:34 PM  Result Value Ref Range   D-Dimer, Quant <0.27 0.00 - 0.48 ug/mL-FEU    Comment:        AT THE INHOUSE ESTABLISHED CUTOFF VALUE OF 0.48 ug/mL FEU, THIS ASSAY HAS BEEN DOCUMENTED IN THE LITERATURE TO HAVE A SENSITIVITY AND NEGATIVE PREDICTIVE VALUE OF AT LEAST 98 TO 99%.  THE TEST RESULT SHOULD BE CORRELATED WITH AN ASSESSMENT OF THE CLINICAL PROBABILITY OF DVT / VTE.   CBC     Status: Abnormal   Collection Time: 09/10/14 11:38 PM  Result Value Ref Range   WBC 5.5 4.0 - 10.5 K/uL   RBC 4.05 3.87 - 5.11 MIL/uL   Hemoglobin 10.9 (L) 12.0 - 15.0 g/dL   HCT 33.0 (L) 36.0 - 46.0 %   MCV 81.5 78.0 - 100.0 fL   MCH 26.9 26.0 - 34.0 pg   MCHC 33.0 30.0 - 36.0 g/dL   RDW 14.3 11.5 - 15.5 %   Platelets 187 150 - 400 K/uL  Basic metabolic panel     Status: Abnormal    Collection Time: 09/10/14 11:38 PM  Result Value Ref Range   Sodium 134 (L) 135 - 145 mmol/L   Potassium 3.3 (L) 3.5 - 5.1 mmol/L   Chloride 98 96 - 112 mmol/L   CO2 24 19 - 32 mmol/L   Glucose, Bld 114 (H) 70 - 99 mg/dL   BUN 31 (H) 6 - 23 mg/dL   Creatinine, Ser 1.55 (H) 0.50 - 1.10 mg/dL   Calcium 8.6 8.4 - 10.5 mg/dL   GFR calc non Af Amer 33 (L) >90 mL/min   GFR  calc Af Amer 38 (L) >90 mL/min    Comment: (NOTE) The eGFR has been calculated using the CKD EPI equation. This calculation has not been validated in all clinical situations. eGFR's persistently <90 mL/min signify possible Chronic Kidney Disease.    Anion gap 12 5 - 15  I-stat troponin, ED (not at [facility])     Status: None   Collection Time: 09/10/14 11:41 PM  Result Value Ref Range   Troponin i, poc 0.00 0.00 - 0.08 ng/mL   Comment 3            Comment: Due to the release kinetics of cTnI, a negative result within the first hours of the onset of symptoms does not rule out myocardial infarction with certainty. If myocardial infarction is still suspected, repeat the test at appropriate intervals.    No results found.  Review of Systems  Constitutional: Negative for fever and chills.  Eyes: Negative for double vision and photophobia.  Respiratory: Negative for cough, hemoptysis and sputum production.   Cardiovascular: Positive for chest pain. Negative for palpitations, orthopnea, claudication and leg swelling.  Gastrointestinal: Positive for nausea. Negative for vomiting and abdominal pain.  Genitourinary: Negative for dysuria.  Neurological: Negative for dizziness and headaches.    Blood pressure 148/92, pulse 51, temperature 98.6 F (37 C), temperature source Oral, resp. rate 19, height _0  (1.702 m), weight 92 kg (202 lb 13.2 oz), SpO2 98 %. Physical Exam  Constitutional: She is oriented to person, place, and time.  HENT:  Head: Normocephalic and atraumatic.  Eyes: Conjunctivae are normal. Pupils are  equal, round, and reactive to light. Left eye exhibits no discharge. No scleral icterus.  Neck: Normal range of motion. Neck supple. No JVD present. No tracheal deviation present. No thyromegaly present.  Cardiovascular:  Bradycardic S1 and S2 soft there is soft systolic murmur no S3 or S4 gallop  Respiratory: Effort normal and breath sounds normal. No respiratory distress. She has no wheezes. She has no rales.  GI: Soft. Bowel sounds are normal. She exhibits no distension. There is no tenderness. There is no rebound.  Musculoskeletal: She exhibits no edema or tenderness.  Neurological: She is alert and oriented to person, place, and time.     Assessment/Plan Recurrent chest pain worrisome for angina rule out MI Marked sinus bradycardia secondary to meds Hypertension Diabetes mellitus History of paroxysmal recurrent A. fib Hypercholesteremia Degenerative joint disease History of CA of breast History of CA of vulva Depression Hypokalemia Plan As per orders  HARWANI,MOHAN N 09/11/2014, 7:19 AM

## 2014-09-11 NOTE — Progress Notes (Signed)
ANTICOAGULATION CONSULT NOTE - Follow Up Consult  Pharmacy Consult for heparin Indication: chest pain/ACS  Allergies  Allergen Reactions  . Shellfish Allergy Swelling    Patient Measurements: Height: 5\' 7"  (170.2 cm) Weight: 202 lb 13.2 oz (92 kg) IBW/kg (Calculated) : 61.6 Heparin Dosing Weight: 81.5kg  Vital Signs: Temp: 97.9 F (36.6 C) (04/15 1606) Temp Source: Oral (04/15 1606) BP: 125/48 mmHg (04/15 1606) Pulse Rate: 45 (04/15 1606)  Labs:  Recent Labs  09/10/14 2338  09/11/14 1048 09/11/14 1320 09/11/14 1556 09/11/14 1854  HGB 10.9*  --   --   --   --   --   HCT 33.0*  --   --   --   --   --   PLT 187  --   --   --   --   --   HEPARINUNFRC  --   --  0.48  --   --  0.73*  CREATININE 1.55*  --   --   --   --   --   TROPONINI  --   < > <0.03 <0.03 <0.03  --   < > = values in this interval not displayed.  Estimated Creatinine Clearance: 38.8 mL/min (by C-G formula based on Cr of 1.55).   Medications:  Scheduled:  . amLODipine  5 mg Oral Daily  . [START ON 09/12/2014] aspirin EC  81 mg Oral Daily  . FLUoxetine  10 mg Oral Daily  . insulin aspart  0-9 Units Subcutaneous TID WC  . losartan  100 mg Oral Daily  . pantoprazole  40 mg Oral Q0600  . pravastatin  40 mg Oral QPM  . traZODone  50 mg Oral QHS   Infusions:  . sodium chloride    . heparin 900 Units/hr (09/11/14 0335)  . nitroGLYCERIN 10 mcg/min (09/11/14 0815)    Assessment: 74 yo female on heparin for CP and initial heparin level is at goal (HL= 0.48) on 900 units/hr. Plans noted for myvoview.  PM heparin level slightly elevated at 0.73  Goal of Therapy:  Heparin level 0.3-0.7 units/ml Monitor platelets by anticoagulation protocol: Yes   Plan:  Decrease heparin to 800 units / hr Follow up AM labs  Thank you. Anette Guarneri, PharmD [phone] 09/11/2014 7:29 PM

## 2014-09-12 ENCOUNTER — Observation Stay (HOSPITAL_COMMUNITY): Payer: Medicare Other

## 2014-09-12 DIAGNOSIS — I10 Essential (primary) hypertension: Secondary | ICD-10-CM | POA: Diagnosis not present

## 2014-09-12 DIAGNOSIS — D509 Iron deficiency anemia, unspecified: Secondary | ICD-10-CM | POA: Diagnosis not present

## 2014-09-12 DIAGNOSIS — I25110 Atherosclerotic heart disease of native coronary artery with unstable angina pectoris: Secondary | ICD-10-CM | POA: Diagnosis not present

## 2014-09-12 DIAGNOSIS — E119 Type 2 diabetes mellitus without complications: Secondary | ICD-10-CM | POA: Diagnosis not present

## 2014-09-12 DIAGNOSIS — I209 Angina pectoris, unspecified: Secondary | ICD-10-CM | POA: Diagnosis not present

## 2014-09-12 DIAGNOSIS — R001 Bradycardia, unspecified: Secondary | ICD-10-CM | POA: Diagnosis not present

## 2014-09-12 DIAGNOSIS — R079 Chest pain, unspecified: Secondary | ICD-10-CM | POA: Diagnosis not present

## 2014-09-12 LAB — HEMOGLOBIN A1C
Hgb A1c MFr Bld: 6.3 % — ABNORMAL HIGH (ref 4.8–5.6)
Mean Plasma Glucose: 134 mg/dL

## 2014-09-12 LAB — CBC
HCT: 30.4 % — ABNORMAL LOW (ref 36.0–46.0)
Hemoglobin: 10 g/dL — ABNORMAL LOW (ref 12.0–15.0)
MCH: 27.5 pg (ref 26.0–34.0)
MCHC: 32.9 g/dL (ref 30.0–36.0)
MCV: 83.7 fL (ref 78.0–100.0)
Platelets: 157 10*3/uL (ref 150–400)
RBC: 3.63 MIL/uL — ABNORMAL LOW (ref 3.87–5.11)
RDW: 14.6 % (ref 11.5–15.5)
WBC: 5.5 10*3/uL (ref 4.0–10.5)

## 2014-09-12 LAB — BASIC METABOLIC PANEL
Anion gap: 11 (ref 5–15)
BUN: 23 mg/dL (ref 6–23)
CO2: 26 mmol/L (ref 19–32)
Calcium: 8.5 mg/dL (ref 8.4–10.5)
Chloride: 103 mmol/L (ref 96–112)
Creatinine, Ser: 1.2 mg/dL — ABNORMAL HIGH (ref 0.50–1.10)
GFR calc Af Amer: 51 mL/min — ABNORMAL LOW (ref >90)
GFR calc non Af Amer: 44 mL/min — ABNORMAL LOW (ref >90)
Glucose, Bld: 104 mg/dL — ABNORMAL HIGH (ref 70–99)
Potassium: 3.8 mmol/L (ref 3.5–5.1)
Sodium: 140 mmol/L (ref 135–145)

## 2014-09-12 LAB — GLUCOSE, CAPILLARY
Glucose-Capillary: 153 mg/dL — ABNORMAL HIGH (ref 70–99)
Glucose-Capillary: 83 mg/dL (ref 70–99)
Glucose-Capillary: 108 mg/dL — ABNORMAL HIGH (ref 70–99)

## 2014-09-12 LAB — HEPARIN LEVEL (UNFRACTIONATED): Heparin Unfractionated: 0.51 IU/mL (ref 0.30–0.70)

## 2014-09-12 LAB — LIPID PANEL
Cholesterol: 126 mg/dL (ref 0–200)
HDL: 44 mg/dL (ref >39)
LDL Cholesterol: 65 mg/dL (ref 0–99)
Total CHOL/HDL Ratio: 2.9 RATIO
Triglycerides: 84 mg/dL (ref <150)
VLDL: 17 mg/dL (ref 0–40)

## 2014-09-12 MED ORDER — REGADENOSON 0.4 MG/5ML IV SOLN
0.4000 mg | Freq: Once | INTRAVENOUS | Status: AC
  Administered 2014-09-12: 0.4 mg via INTRAVENOUS
  Filled 2014-09-12: qty 5

## 2014-09-12 MED ORDER — REGADENOSON 0.4 MG/5ML IV SOLN
INTRAVENOUS | Status: AC
  Filled 2014-09-12: qty 5

## 2014-09-12 MED ORDER — TECHNETIUM TC 99M SESTAMIBI - CARDIOLITE
30.0000 | Freq: Once | INTRAVENOUS | Status: AC | PRN
  Administered 2014-09-12: 10:00:00 30 via INTRAVENOUS

## 2014-09-12 MED ORDER — TECHNETIUM TC 99M SESTAMIBI GENERIC - CARDIOLITE
10.0000 | Freq: Once | INTRAVENOUS | Status: AC | PRN
Start: 2014-09-12 — End: 2014-09-12
  Administered 2014-09-12: 10 via INTRAVENOUS

## 2014-09-12 NOTE — Progress Notes (Signed)
Subjective:  Patient complains of vague chest pain off and on. Similar episodes of marked sinus bradycardia heart rate in 40s on the monitor noted. Patient is off beta blockers. Tolerated nuclear stress test today results pending  Objective:  Vital Signs in the last 24 hours: Temp:  [97.8 F (36.6 C)-98.6 F (37 C)] 98.6 F (37 C) (04/16 0426) Pulse Rate:  [43-93] 67 (04/16 0942) Resp:  [12-23] 15 (04/16 0426) BP: (106-154)/(38-81) 154/48 mmHg (04/16 0942) SpO2:  [92 %-100 %] 92 % (04/16 0426) Weight:  [91.128 kg (200 lb 14.4 oz)] 91.128 kg (200 lb 14.4 oz) (04/16 0426)  Intake/Output from previous day: 04/15 0701 - 04/16 0700 In: 720 [P.O.:720] Out: 3500 [Urine:3500] Intake/Output from this shift:    Physical Exam: Exam unchanged  Lab Results:  Recent Labs  09/10/14 2338 09/12/14 0605  WBC 5.5 5.5  HGB 10.9* 10.0*  PLT 187 157    Recent Labs  09/10/14 2338 09/12/14 0605  NA 134* 140  K 3.3* 3.8  CL 98 103  CO2 24 26  GLUCOSE 114* 104*  BUN 31* 23  CREATININE 1.55* 1.20*    Recent Labs  09/11/14 1556 09/11/14 2001  TROPONINI <0.03 <0.03   Hepatic Function Panel No results for input(s): PROT, ALBUMIN, AST, ALT, ALKPHOS, BILITOT, BILIDIR, IBILI in the last 72 hours.  Recent Labs  09/12/14 0605  CHOL 126   No results for input(s): PROTIME in the last 72 hours.  Imaging: Imaging results have been reviewed and No results found.  Cardiac Studies:  Assessment/Plan:  Recurrent chest pain worrisome for angina MI ruled out Marked sinus bradycardia secondary to meds Hypertension Diabetes mellitus History of paroxysmal recurrent A. fib Hypercholesteremia Degenerative joint disease History of CA of breast History of CA of vulva Depression Status post Hypokalemia Chronic anemia Plan Continue present management Check Lexiscan Myoview result  LOS: 1 day    HARWANI,MOHAN N 09/12/2014, 11:13 AM

## 2014-09-12 NOTE — Progress Notes (Signed)
ANTICOAGULATION CONSULT NOTE - Follow Up Consult  Pharmacy Consult for heparin Indication: chest pain/ACS  Allergies  Allergen Reactions  . Shellfish Allergy Swelling    Patient Measurements: Height: 5\' 7"  (170.2 cm) Weight: 200 lb 14.4 oz (91.128 kg) IBW/kg (Calculated) : 61.6 Heparin Dosing Weight: 81.5kg  Vital Signs: Temp: 98.6 F (37 C) (04/16 0426) Temp Source: Oral (04/16 0426) BP: 140/48 mmHg (04/16 1110) Pulse Rate: 67 (04/16 0942)  Labs:  Recent Labs  09/10/14 2338  09/11/14 1048 09/11/14 1320 09/11/14 1556 09/11/14 1854 09/11/14 2001 09/12/14 0605 09/12/14 1108  HGB 10.9*  --   --   --   --   --   --  10.0*  --   HCT 33.0*  --   --   --   --   --   --  30.4*  --   PLT 187  --   --   --   --   --   --  157  --   HEPARINUNFRC  --   --  0.48  --   --  0.73*  --   --  0.51  CREATININE 1.55*  --   --   --   --   --   --  1.20*  --   TROPONINI  --   < > <0.03 <0.03 <0.03  --  <0.03  --   --   < > = values in this interval not displayed.  Estimated Creatinine Clearance: 49.8 mL/min (by C-G formula based on Cr of 1.2).   Medications:  Scheduled:  . amLODipine  5 mg Oral Daily  . aspirin EC  81 mg Oral Daily  . FLUoxetine  10 mg Oral Daily  . insulin aspart  0-9 Units Subcutaneous TID WC  . losartan  100 mg Oral Daily  . pantoprazole  40 mg Oral Q0600  . pravastatin  40 mg Oral QPM  . regadenoson      . traZODone  50 mg Oral QHS   Infusions:  . sodium chloride 50 mL/hr at 09/11/14 1948  . heparin 800 Units/hr (09/11/14 1948)  . nitroGLYCERIN 10 mcg/min (09/11/14 0815)    Assessment: 74 yo female on heparin for CP. HL is now therapeutic at 0.51 after decrease in rate. Hgb has trended down to 10, plt wnl with no reported significant s/s bleeding.   Goal of Therapy:  Heparin level 0.3-0.7 units/ml Monitor platelets by anticoagulation protocol: Yes   Plan:  - Continue heparin gtt at 800 u/hr - Daily HL/CBC - Monitor for s/s bleeding - F/u  Lexiscan results  Erika K. Velva Harman, PharmD, [place] Clinical Pharmacist - Resident Pager: [phone] Pharmacy: [phone] 09/12/2014 11:51 AM

## 2014-09-12 NOTE — Progress Notes (Signed)
UR completed 

## 2014-09-13 DIAGNOSIS — Z853 Personal history of malignant neoplasm of breast: Secondary | ICD-10-CM | POA: Diagnosis not present

## 2014-09-13 DIAGNOSIS — Z87891 Personal history of nicotine dependence: Secondary | ICD-10-CM | POA: Diagnosis not present

## 2014-09-13 DIAGNOSIS — F329 Major depressive disorder, single episode, unspecified: Secondary | ICD-10-CM | POA: Diagnosis present

## 2014-09-13 DIAGNOSIS — Z9221 Personal history of antineoplastic chemotherapy: Secondary | ICD-10-CM | POA: Diagnosis not present

## 2014-09-13 DIAGNOSIS — E119 Type 2 diabetes mellitus without complications: Secondary | ICD-10-CM | POA: Diagnosis not present

## 2014-09-13 DIAGNOSIS — I10 Essential (primary) hypertension: Secondary | ICD-10-CM | POA: Diagnosis present

## 2014-09-13 DIAGNOSIS — Z9011 Acquired absence of right breast and nipple: Secondary | ICD-10-CM | POA: Diagnosis present

## 2014-09-13 DIAGNOSIS — T50995A Adverse effect of other drugs, medicaments and biological substances, initial encounter: Secondary | ICD-10-CM | POA: Diagnosis present

## 2014-09-13 DIAGNOSIS — Z923 Personal history of irradiation: Secondary | ICD-10-CM | POA: Diagnosis not present

## 2014-09-13 DIAGNOSIS — Z91013 Allergy to seafood: Secondary | ICD-10-CM | POA: Diagnosis not present

## 2014-09-13 DIAGNOSIS — Z7902 Long term (current) use of antithrombotics/antiplatelets: Secondary | ICD-10-CM | POA: Diagnosis not present

## 2014-09-13 DIAGNOSIS — E780 Pure hypercholesterolemia: Secondary | ICD-10-CM | POA: Diagnosis present

## 2014-09-13 DIAGNOSIS — I25110 Atherosclerotic heart disease of native coronary artery with unstable angina pectoris: Secondary | ICD-10-CM | POA: Diagnosis not present

## 2014-09-13 DIAGNOSIS — D509 Iron deficiency anemia, unspecified: Secondary | ICD-10-CM | POA: Diagnosis not present

## 2014-09-13 DIAGNOSIS — R001 Bradycardia, unspecified: Secondary | ICD-10-CM | POA: Diagnosis not present

## 2014-09-13 DIAGNOSIS — I482 Chronic atrial fibrillation: Secondary | ICD-10-CM | POA: Diagnosis present

## 2014-09-13 DIAGNOSIS — I209 Angina pectoris, unspecified: Secondary | ICD-10-CM | POA: Diagnosis not present

## 2014-09-13 DIAGNOSIS — Z8249 Family history of ischemic heart disease and other diseases of the circulatory system: Secondary | ICD-10-CM | POA: Diagnosis not present

## 2014-09-13 DIAGNOSIS — E876 Hypokalemia: Secondary | ICD-10-CM | POA: Diagnosis present

## 2014-09-13 LAB — HEPARIN LEVEL (UNFRACTIONATED): Heparin Unfractionated: 0.57 IU/mL (ref 0.30–0.70)

## 2014-09-13 LAB — CBC
HCT: 28 % — ABNORMAL LOW (ref 36.0–46.0)
Hemoglobin: 9.3 g/dL — ABNORMAL LOW (ref 12.0–15.0)
MCH: 27.4 pg (ref 26.0–34.0)
MCHC: 33.2 g/dL (ref 30.0–36.0)
MCV: 82.4 fL (ref 78.0–100.0)
Platelets: 152 10*3/uL (ref 150–400)
RBC: 3.4 MIL/uL — ABNORMAL LOW (ref 3.87–5.11)
RDW: 14.7 % (ref 11.5–15.5)
WBC: 4.9 10*3/uL (ref 4.0–10.5)

## 2014-09-13 LAB — PROTIME-INR
INR: 1.28 (ref 0.00–1.49)
Prothrombin Time: 16.2 seconds — ABNORMAL HIGH (ref 11.6–15.2)

## 2014-09-13 LAB — GLUCOSE, CAPILLARY
Glucose-Capillary: 99 mg/dL (ref 70–99)
Glucose-Capillary: 99 mg/dL (ref 70–99)
Glucose-Capillary: 93 mg/dL (ref 70–99)
Glucose-Capillary: 97 mg/dL (ref 70–99)

## 2014-09-13 MED ORDER — SODIUM CHLORIDE 0.9 % IV SOLN
INTRAVENOUS | Status: DC
  Administered 2014-09-14 (×3): via INTRAVENOUS

## 2014-09-13 MED ORDER — SODIUM CHLORIDE 0.9 % IJ SOLN: 3.0000 mL | INTRAMUSCULAR | Status: DC | PRN

## 2014-09-13 MED ORDER — SODIUM CHLORIDE 0.9 % IJ SOLN
3.0000 mL | Freq: Two times a day (BID) | INTRAMUSCULAR | Status: DC
  Administered 2014-09-13: 3 mL via INTRAVENOUS

## 2014-09-13 MED ORDER — SODIUM CHLORIDE 0.9 % IV SOLN: 250.0000 mL | INTRAVENOUS | Status: DC | PRN

## 2014-09-13 MED ORDER — ASPIRIN 81 MG PO CHEW
81.0000 mg | CHEWABLE_TABLET | ORAL | Status: AC
  Administered 2014-09-14: 81 mg via ORAL
  Filled 2014-09-13: qty 1

## 2014-09-13 NOTE — Progress Notes (Signed)
UR completed 

## 2014-09-13 NOTE — Progress Notes (Signed)
ANTICOAGULATION CONSULT NOTE - Follow Up Consult  Pharmacy Consult for heparin Indication: chest pain/ACS  Allergies  Allergen Reactions  . Shellfish Allergy Swelling    Patient Measurements: Height: 5\' 7"  (170.2 cm) Weight: 200 lb 14.4 oz (91.128 kg) IBW/kg (Calculated) : 61.6 Heparin Dosing Weight: 81.5kg  Vital Signs: Temp: 99.1 F (37.3 C) (04/17 0745) Temp Source: Oral (04/17 0745) BP: 132/61 mmHg (04/17 0935) Pulse Rate: 56 (04/17 0745)  Labs:  Recent Labs  09/10/14 2338  09/11/14 1320 09/11/14 1556 09/11/14 1854 09/11/14 2001 09/12/14 0605 09/12/14 1108 09/13/14 0345  HGB 10.9*  --   --   --   --   --  10.0*  --  9.3*  HCT 33.0*  --   --   --   --   --  30.4*  --  28.0*  PLT 187  --   --   --   --   --  157  --  152  HEPARINUNFRC  --   < >  --   --  0.73*  --   --  0.51 0.57  CREATININE 1.55*  --   --   --   --   --  1.20*  --   --   TROPONINI  --   < > <0.03 <0.03  --  <0.03  --   --   --   < > = values in this interval not displayed.  Estimated Creatinine Clearance: 49.8 mL/min (by C-G formula based on Cr of 1.2).   Medications:  Scheduled:  . amLODipine  5 mg Oral Daily  . [START ON 09/14/2014] aspirin  81 mg Oral Pre-Cath  . aspirin EC  81 mg Oral Daily  . FLUoxetine  10 mg Oral Daily  . insulin aspart  0-9 Units Subcutaneous TID WC  . losartan  100 mg Oral Daily  . pantoprazole  40 mg Oral Q0600  . pravastatin  40 mg Oral QPM  . sodium chloride  3 mL Intravenous Q12H  . traZODone  50 mg Oral QHS   Infusions:  . sodium chloride 50 mL/hr at 09/13/14 0501  . [START ON 09/14/2014] sodium chloride    . heparin 800 Units/hr (09/13/14 0957)  . nitroGLYCERIN 10 mcg/min (09/12/14 2130)    Assessment: 74 yo female on heparin for CP s/p nuclear stress test suggestive of moderate size inferior wall ischemia. HL remains therapeutic at 0.57. Hgb has trended down to 9.3, plt wnl with no reported significant s/s bleeding.   Goal of Therapy:  Heparin  level 0.3-0.7 units/ml Monitor platelets by anticoagulation protocol: Yes   Plan:  - Continue heparin gtt at 800 u/hr - Daily HL/CBC - Monitor for s/s bleeding - F/u plans for [employer] K. Velva Harman, PharmD, [place] Clinical Pharmacist - Resident Pager: [phone] Pharmacy: [phone] 09/13/2014 11:29 AM

## 2014-09-13 NOTE — Progress Notes (Signed)
Subjective:  Patient complains of vague chest pain off and on patient underwent nuclear stress test yesterday which showed moderate size inferior wall reversible ischemia with EF of 55%.  Objective:  Vital Signs in the last 24 hours: Temp:  [98.4 F (36.9 C)-99.1 F (37.3 C)] 99.1 F (37.3 C) (04/17 0745) Pulse Rate:  [48-64] 56 (04/17 0745) Resp:  [16-21] 16 (04/17 0745) BP: (96-144)/(39-85) 132/61 mmHg (04/17 0935) SpO2:  [93 %-99 %] 99 % (04/17 0745)  Intake/Output from previous day: 04/16 0701 - 04/17 0700 In: 2614.3 [P.O.:120; I.V.:2494.3] Out: 1650 [Urine:1650] Intake/Output from this shift: Total I/O In: 120 [P.O.:120] Out: -   Physical Exam: Neck: no adenopathy, no carotid bruit, no JVD and supple, symmetrical, trachea midline Lungs: clear to auscultation bilaterally Heart: regular rate and rhythm, S1, S2 normal and Soft systolic murmur noted Abdomen: soft, non-tender; bowel sounds normal; no masses,  no organomegaly Extremities: extremities normal, atraumatic, no cyanosis or edema  Lab Results:  Recent Labs  09/12/14 0605 09/13/14 0345  WBC 5.5 4.9  HGB 10.0* 9.3*  PLT 157 152    Recent Labs  09/10/14 2338 09/12/14 0605  NA 134* 140  K 3.3* 3.8  CL 98 103  CO2 24 26  GLUCOSE 114* 104*  BUN 31* 23  CREATININE 1.55* 1.20*    Recent Labs  09/11/14 1556 09/11/14 2001  TROPONINI <0.03 <0.03   Hepatic Function Panel No results for input(s): PROT, ALBUMIN, AST, ALT, ALKPHOS, BILITOT, BILIDIR, IBILI in the last 72 hours.  Recent Labs  09/12/14 0605  CHOL 126   No results for input(s): PROTIME in the last 72 hours.  Imaging: Imaging results have been reviewed and Nm Myocar Multi W/spect W/wall Motion / Ef  09/12/2014   CLINICAL DATA:  74 year old female with a history of chest pain.  Cardiovascular risk factors include history of coronary artery disease, hypertension, hyperlipidemia, diabetes  EXAM: MYOCARDIAL IMAGING WITH SPECT (REST AND  PHARMACOLOGIC-STRESS)  GATED LEFT VENTRICULAR WALL MOTION STUDY  LEFT VENTRICULAR EJECTION FRACTION  TECHNIQUE: Standard myocardial SPECT imaging was performed after resting intravenous injection of 10 mCi Tc-51m sestamibi. Subsequently, intravenous infusion of Lexiscan was performed under the supervision of the Cardiology staff. At peak effect of the drug, 30 mCi Tc-20m sestamibi was injected intravenously and standard myocardial SPECT imaging was performed. Quantitative gated imaging was also performed to evaluate left ventricular wall motion, and estimate left ventricular ejection fraction.  COMPARISON:  None.  FINDINGS: Perfusion: Medium-sized moderate severity reversible defect of the inferior wall.  Wall Motion: Normal left ventricular wall motion. No left ventricular dilation.  Left Ventricular Ejection Fraction: 55 %  End diastolic volume [id number]  End systolic volume 46 ml  IMPRESSION: 1. Reversible defect of the inferior wall of medium size moderate severity.  2. Normal left ventricular wall motion.  3. Left ventricular ejection fraction 55%  4. Intermediate-risk stress test findings*.  *2012 Appropriate Use Criteria for Coronary Revascularization Focused Update: J Am Coll Cardiol. [id number]. http://content.airportbarriers.com.aspx?articleid=1201161   Electronically Signed   By: Corrie Mckusick D.O.   On: 09/12/2014 13:57    Cardiac Studies:  Assessment/Plan:  Recurrent chest pain worrisome for angina MI ruled out  posture nuclear stress test Marked sinus bradycardia secondary to meds Hypertension Diabetes mellitus History of paroxysmal recurrent A. fib Hypercholesteremia Degenerative joint disease History of CA of breast History of CA of vulva Depression Status post Hypokalemia Acute on chronic Chronic anemia secondary to hydration rule out GI loss Plan Discussed with  patient at length regarding nuclear stress test results and left cardiac cath possible PTCA stenting its risk  and benefits i.e. death MI stroke need for emergency CABG local vascular, patient's etc. and consents for PCI   LOS: 2 days    HARWANI,MOHAN N 09/13/2014, 11:02 AM

## 2014-09-14 ENCOUNTER — Encounter (HOSPITAL_COMMUNITY)
Admission: EM | Disposition: A | Discharge status: Home / Self Care | Payer: Self-pay | Source: Home / Self Care | Attending: Cardiology

## 2014-09-14 ENCOUNTER — Encounter (HOSPITAL_COMMUNITY): Payer: Self-pay | Admitting: Cardiology

## 2014-09-14 HISTORY — PX: LEFT HEART CATHETERIZATION WITH CORONARY ANGIOGRAM: SHX5451

## 2014-09-14 LAB — CBC
HCT: 28.1 % — ABNORMAL LOW (ref 36.0–46.0)
Hemoglobin: 9.3 g/dL — ABNORMAL LOW (ref 12.0–15.0)
MCH: 27.3 pg (ref 26.0–34.0)
MCHC: 33.1 g/dL (ref 30.0–36.0)
MCV: 82.4 fL (ref 78.0–100.0)
Platelets: 163 10*3/uL (ref 150–400)
RBC: 3.41 MIL/uL — ABNORMAL LOW (ref 3.87–5.11)
RDW: 14.6 % (ref 11.5–15.5)
WBC: 4.4 10*3/uL (ref 4.0–10.5)

## 2014-09-14 LAB — GLUCOSE, CAPILLARY
Glucose-Capillary: 88 mg/dL (ref 70–99)
Glucose-Capillary: 92 mg/dL (ref 70–99)
Glucose-Capillary: 89 mg/dL (ref 70–99)
Glucose-Capillary: 86 mg/dL (ref 70–99)
Glucose-Capillary: 223 mg/dL — ABNORMAL HIGH (ref 70–99)

## 2014-09-14 LAB — POCT ACTIVATED CLOTTING TIME: Activated Clotting Time: 134 seconds

## 2014-09-14 LAB — HEPARIN LEVEL (UNFRACTIONATED): Heparin Unfractionated: 0.51 IU/mL (ref 0.30–0.70)

## 2014-09-14 SURGERY — LEFT HEART CATHETERIZATION WITH CORONARY ANGIOGRAM
Anesthesia: LOCAL

## 2014-09-14 MED ORDER — ACETAMINOPHEN 325 MG PO TABS: 650.0000 mg | ORAL_TABLET | ORAL | Status: DC | PRN

## 2014-09-14 MED ORDER — FAMOTIDINE IN NACL 20-0.9 MG/50ML-% IV SOLN
INTRAVENOUS | Status: AC
  Filled 2014-09-14: qty 50

## 2014-09-14 MED ORDER — METHYLPREDNISOLONE SODIUM SUCC 125 MG IJ SOLR
INTRAMUSCULAR | Status: AC
  Filled 2014-09-14: qty 2

## 2014-09-14 MED ORDER — LIDOCAINE HCL (PF) 1 % IJ SOLN
INTRAMUSCULAR | Status: AC
  Filled 2014-09-14: qty 30

## 2014-09-14 MED ORDER — HEPARIN (PORCINE) IN NACL 2-0.9 UNIT/ML-% IJ SOLN
INTRAMUSCULAR | Status: AC
  Filled 2014-09-14: qty 1000

## 2014-09-14 MED ORDER — ONDANSETRON HCL 4 MG/2ML IJ SOLN: 4.0000 mg | Freq: Four times a day (QID) | INTRAMUSCULAR | Status: DC | PRN

## 2014-09-14 MED ORDER — MIDAZOLAM HCL 2 MG/2ML IJ SOLN
INTRAMUSCULAR | Status: AC
  Filled 2014-09-14: qty 2

## 2014-09-14 MED ORDER — FENTANYL CITRATE (PF) 100 MCG/2ML IJ SOLN
INTRAMUSCULAR | Status: AC
  Filled 2014-09-14: qty 2

## 2014-09-14 MED ORDER — DIPHENHYDRAMINE HCL 50 MG/ML IJ SOLN
INTRAMUSCULAR | Status: AC
  Filled 2014-09-14: qty 1

## 2014-09-14 MED ORDER — SODIUM CHLORIDE 0.9 % IV SOLN: INTRAVENOUS | Status: AC

## 2014-09-14 NOTE — H&P (View-Only) (Signed)
Subjective:  Patient complains of vague chest pain off and on patient underwent nuclear stress test yesterday which showed moderate size inferior wall reversible ischemia with EF of 55%.  Objective:  Vital Signs in the last 24 hours: Temp:  [98.4 F (36.9 C)-99.1 F (37.3 C)] 99.1 F (37.3 C) (04/17 0745) Pulse Rate:  [48-64] 56 (04/17 0745) Resp:  [16-21] 16 (04/17 0745) BP: (96-144)/(39-85) 132/61 mmHg (04/17 0935) SpO2:  [93 %-99 %] 99 % (04/17 0745)  Intake/Output from previous day: 04/16 0701 - 04/17 0700 In: 2614.3 [P.O.:120; I.V.:2494.3] Out: 1650 [Urine:1650] Intake/Output from this shift: Total I/O In: 120 [P.O.:120] Out: -   Physical Exam: Neck: no adenopathy, no carotid bruit, no JVD and supple, symmetrical, trachea midline Lungs: clear to auscultation bilaterally Heart: regular rate and rhythm, S1, S2 normal and Soft systolic murmur noted Abdomen: soft, non-tender; bowel sounds normal; no masses,  no organomegaly Extremities: extremities normal, atraumatic, no cyanosis or edema  Lab Results:  Recent Labs  09/12/14 0605 09/13/14 0345  WBC 5.5 4.9  HGB 10.0* 9.3*  PLT 157 152    Recent Labs  09/10/14 2338 09/12/14 0605  NA 134* 140  K 3.3* 3.8  CL 98 103  CO2 24 26  GLUCOSE 114* 104*  BUN 31* 23  CREATININE 1.55* 1.20*    Recent Labs  09/11/14 1556 09/11/14 2001  TROPONINI <0.03 <0.03   Hepatic Function Panel No results for input(s): PROT, ALBUMIN, AST, ALT, ALKPHOS, BILITOT, BILIDIR, IBILI in the last 72 hours.  Recent Labs  09/12/14 0605  CHOL 126   No results for input(s): PROTIME in the last 72 hours.  Imaging: Imaging results have been reviewed and Nm Myocar Multi W/spect W/wall Motion / Ef  09/12/2014   CLINICAL DATA:  74 year old female with a history of chest pain.  Cardiovascular risk factors include history of coronary artery disease, hypertension, hyperlipidemia, diabetes  EXAM: MYOCARDIAL IMAGING WITH SPECT (REST AND  PHARMACOLOGIC-STRESS)  GATED LEFT VENTRICULAR WALL MOTION STUDY  LEFT VENTRICULAR EJECTION FRACTION  TECHNIQUE: Standard myocardial SPECT imaging was performed after resting intravenous injection of 10 mCi Tc-67m sestamibi. Subsequently, intravenous infusion of Lexiscan was performed under the supervision of the Cardiology staff. At peak effect of the drug, 30 mCi Tc-24m sestamibi was injected intravenously and standard myocardial SPECT imaging was performed. Quantitative gated imaging was also performed to evaluate left ventricular wall motion, and estimate left ventricular ejection fraction.  COMPARISON:  None.  FINDINGS: Perfusion: Medium-sized moderate severity reversible defect of the inferior wall.  Wall Motion: Normal left ventricular wall motion. No left ventricular dilation.  Left Ventricular Ejection Fraction: 55 %  End diastolic volume [id number] ml  End systolic volume 46 ml  IMPRESSION: 1. Reversible defect of the inferior wall of medium size moderate severity.  2. Normal left ventricular wall motion.  3. Left ventricular ejection fraction 55%  4. Intermediate-risk stress test findings*.  *2012 Appropriate Use Criteria for Coronary Revascularization Focused Update: J Am Coll Cardiol. [id number]. http://content.airportbarriers.com.aspx?articleid=1201161   Electronically Signed   By: Corrie Mckusick D.O.   On: 09/12/2014 13:57    Cardiac Studies:  Assessment/Plan:  Recurrent chest pain worrisome for angina MI ruled out  posture nuclear stress test Marked sinus bradycardia secondary to meds Hypertension Diabetes mellitus History of paroxysmal recurrent A. fib Hypercholesteremia Degenerative joint disease History of CA of breast History of CA of vulva Depression Status post Hypokalemia Acute on chronic Chronic anemia secondary to hydration rule out GI loss Plan Discussed with  patient at length regarding nuclear stress test results and left cardiac cath possible PTCA stenting its risk  and benefits i.e. death MI stroke need for emergency CABG local vascular, patient's etc. and consents for PCI   LOS: 2 days    HARWANI,MOHAN N 09/13/2014, 11:02 AM

## 2014-09-14 NOTE — Interval H&P Note (Signed)
Cath Lab Visit (complete for each Cath Lab visit)  Clinical Evaluation Leading to the Procedure:   ACS: No.  Non-ACS:    Anginal Classification: CCS III  Anti-ischemic medical therapy: Maximal Therapy (2 or more classes of medications)  Non-Invasive Test Results: Intermediate-risk stress test findings: cardiac mortality 1-3%/year  Prior CABG: No previous CABG      History and Physical Interval Note:  09/14/2014 2:47 PM  Diane [name]  has presented today for surgery, with the diagnosis of unstable angina  The various methods of treatment have been discussed with the patient and family. After consideration of risks, benefits and other options for treatment, the patient has consented to  Procedure(s): LEFT HEART CATHETERIZATION WITH CORONARY ANGIOGRAM (N/A) as a surgical intervention .  The patient's history has been reviewed, patient examined, no change in status, stable for surgery.  I have reviewed the patient's chart and labs.  Questions were answered to the patient's satisfaction.     Clent Demark

## 2014-09-14 NOTE — Care Management Note (Signed)
    Page 1 of 1   09/14/2014     4:43:34 PM CARE MANAGEMENT NOTE 09/14/2014  Patient:  Diane [name], Diane [name]   Account Number:  [id number]  Date Initiated:  09/14/2014  Documentation initiated by:  GRAVES-BIGELOW,BRENDA  Subjective/Objective Assessment:   Pt admitted for cp. S/p positive stress test 09-12-14. S/p cath 09-14-14.     Action/Plan:   No needs identified by CM at this time.   Anticipated DC Date:  09/15/2014   Anticipated DC Plan:  [place]  CM consult      Choice offered to / List presented to:             Status of service:  Completed, signed off Medicare Important Message given?  YES (If response is "NO", the following Medicare IM given date fields will be blank) Date Medicare IM given:  09/14/2014 Medicare IM given by:  Elenor Quinones Date Additional Medicare IM given:   Additional Medicare IM given by:    Discharge Disposition:  HOME/SELF CARE  Per UR Regulation:  Reviewed for med. necessity/level of care/duration of stay  If discussed at [place] of Stay Meetings, dates discussed:    Comments:

## 2014-09-14 NOTE — Progress Notes (Signed)
Site area: Scientific laboratory technician Prior to Removal:  Level 0 Pressure Applied For: 25 Manual:   yes Patient Status During Pull:  A/O Post Pull Site:  Level 0 Post Pull Instructions Given:  Post instructions given and pt understands Dressing Applied:  Tegaderm with a 4x4 gauze Bedrest begins @ 16:20:00 Comments: Pt leaves ha in stable condition. Rt groin is unremarkable/ dressing is CDI.

## 2014-09-14 NOTE — Progress Notes (Signed)
ANTICOAGULATION CONSULT NOTE - Follow Up Consult  Pharmacy Consult for heparin Indication: chest pain/ACS  Allergies  Allergen Reactions  . Shellfish Allergy Swelling    Patient Measurements: Height: 5\' 7"  (170.2 cm) Weight: 201 lb 8 oz (91.4 kg) IBW/kg (Calculated) : 61.6 Heparin Dosing Weight: 81.5kg  Vital Signs: Temp: 98.1 F (36.7 C) (04/18 0801) Temp Source: Oral (04/18 0801) BP: 128/82 mmHg (04/18 0801) Pulse Rate: 45 (04/18 0801)  Labs:  Recent Labs  09/11/14 1320 09/11/14 1556  09/11/14 2001  09/12/14 0605 09/12/14 1108 09/13/14 0345 09/13/14 1400 09/14/14 0447  HGB  --   --   --   --   < > 10.0*  --  9.3*  --  9.3*  HCT  --   --   --   --   --  30.4*  --  28.0*  --  28.1*  PLT  --   --   --   --   --  157  --  152  --  163  LABPROT  --   --   --   --   --   --   --   --  16.2*  --   INR  --   --   --   --   --   --   --   --  1.28  --   HEPARINUNFRC  --   --   < >  --   --   --  0.51 0.57  --  0.51  CREATININE  --   --   --   --   --  1.20*  --   --   --   --   TROPONINI <0.03 <0.03  --  <0.03  --   --   --   --   --   --   < > = values in this interval not displayed.  Estimated Creatinine Clearance: 49.9 mL/min (by C-G formula based on Cr of 1.2).   Medications:  Scheduled:  . amLODipine  5 mg Oral Daily  . aspirin EC  81 mg Oral Daily  . FLUoxetine  10 mg Oral Daily  . insulin aspart  0-9 Units Subcutaneous TID WC  . losartan  100 mg Oral Daily  . pantoprazole  40 mg Oral Q0600  . pravastatin  40 mg Oral QPM  . sodium chloride  3 mL Intravenous Q12H  . traZODone  50 mg Oral QHS   Infusions:  . sodium chloride 50 mL/hr at 09/14/14 0028  . sodium chloride 75 mL/hr at 09/14/14 0900  . heparin 800 Units/hr (09/14/14 0900)  . nitroGLYCERIN 20 mcg/min (09/14/14 0900)    Assessment: 74 yo female on heparin for CP s/p nuclear stress test suggestive of moderate size inferior wall ischemia.  Heparin level and CBC remain stable  Cath planned for  today, pradaxa PTA  Goal of Therapy:  Heparin level 0.3-0.7 units/ml Monitor platelets by anticoagulation protocol: Yes   Plan:  Continue heparin gtt at 800 u/hr Follow up after cath  Thank you. Anette Guarneri, PharmD [phone]  09/14/2014 10:19 AM

## 2014-09-14 NOTE — CV Procedure (Signed)
Left cardiac cath report dictated on 09/14/2014 dictation number is [id number]

## 2014-09-15 LAB — CBC
HCT: 32.7 % — ABNORMAL LOW (ref 36.0–46.0)
Hemoglobin: 10.6 g/dL — ABNORMAL LOW (ref 12.0–15.0)
MCH: 26.9 pg (ref 26.0–34.0)
MCHC: 32.4 g/dL (ref 30.0–36.0)
MCV: 83 fL (ref 78.0–100.0)
Platelets: 172 10*3/uL (ref 150–400)
RBC: 3.94 MIL/uL (ref 3.87–5.11)
RDW: 14.8 % (ref 11.5–15.5)
WBC: 5 10*3/uL (ref 4.0–10.5)

## 2014-09-15 LAB — GLUCOSE, CAPILLARY
Glucose-Capillary: 134 mg/dL — ABNORMAL HIGH (ref 70–99)
Glucose-Capillary: 145 mg/dL — ABNORMAL HIGH (ref 70–99)

## 2014-09-15 MED ORDER — AMLODIPINE BESYLATE 5 MG PO TABS: 5.0000 mg | ORAL_TABLET | Freq: Every day | ORAL | Status: DC

## 2014-09-15 MED ORDER — METFORMIN HCL ER 500 MG PO TB24: 500.0000 mg | ORAL_TABLET | Freq: Every day | ORAL | Status: DC

## 2014-09-15 MED ORDER — LOSARTAN POTASSIUM 100 MG PO TABS: 100.0000 mg | ORAL_TABLET | Freq: Every day | ORAL | Status: DC

## 2014-09-15 NOTE — Progress Notes (Signed)
Pt discharged to discharge lounge with all belongings and discharge paperwork. Pt instructed to call MD for further issues. Pt did not Use SCDS whle here in hospital, RN asked materials to credit pt acct. Etta Quill, RN

## 2014-09-15 NOTE — Discharge Instructions (Signed)
Angina Pectoris  Angina pectoris is extreme discomfort in your chest, neck, or arm. Your doctor may call it just angina. It is caused by a lack of oxygen to your heart wall. It may feel like tightness or heavy pressure. It may feel like a crushing or squeezing pain. Some people say it feels like gas. It may go down your shoulders, back, and arms. Some people have symptoms other than pain. These include:  · Tiredness.  · Shortness of breath.  · Cold sweats.  · Feeling sick to your stomach (nausea).  There are four types of angina:  · Stable angina. This type often lasts the same amount of time each time it happens. Activity, stress, or excitement can bring it on. It often gets better after taking a medicine called nitroglycerin. This goes under your tongue.  · Unstable angina. This type can happen when you are not active or even during sleep. It can suddenly get worse or happen more often. It may not get better after taking the special medicine. It can last up to 30 minutes.  · Microvascular angina. This type is more common in women. It may be more severe or last longer than other types.  · Prinzmetal angina. This type often happens when you are not active or in the early morning hours.  HOME CARE   · Only take medicines as told by your doctor.  · Stay active or exercise more as told by your doctor.  · Limit very hard activity as told by your doctor.  · Limit heavy lifting as told by your doctor.  · Keep a healthy weight.  · Learn about and eat foods that are healthy for your heart.  · Do not use any tobacco such as cigarettes, chewing tobacco, or e-cigarettes.  GET HELP RIGHT AWAY IF:   · You have chest, neck, deep shoulder, or arm pain or discomfort that lasts more than a few minutes.  · You have chest, neck, deep shoulder, or arm pain or discomfort that goes away and comes back over and over again.  · You have heavy sweating that seems to happen for no reason.  · You have shortness of breath or trouble  breathing.  · Your angina does not get better after a few minutes of rest.  · Your angina does not get better after you take nitroglycerin medicine.  These can all be symptoms of a heart attack. Get help right away. Call your local emergency service (911 in [place] Do not  drive yourself to the hospital. Do not  wait to for your symptoms to go away.  MAKE SURE YOU:   · Understand these instructions.  · Will watch your condition.  · Will get help right away if you are not doing well or get worse.  Document Released: 11/01/2007 Document Revised: 05/20/2013 Document Reviewed: 09/16/2013  ExitCare® Patient Information ©2015 [employer]. This information is not intended to replace advice given to you by your health care provider. Make sure you discuss any questions you have with your health care provider.

## 2014-09-15 NOTE — Op Note (Signed)
NAME:  Diane [name], Diane [name]           ACCOUNT NO.:  [id number]  MEDICAL RECORD NO.:  [id number]  LOCATION:  3W14C                        FACILITY:  [facility]  PHYSICIAN:  Allegra Lai. Terrence Dupont, M.D. DATE OF BIRTH:  1941-02-14  DATE OF PROCEDURE:  09/14/2014 DATE OF DISCHARGE:                              OPERATIVE REPORT   PROCEDURE:  Left cardiac cath with selective left and right coronary angiography, LV graphy via right groin using Judkins technique.  INDICATION FOR THE PROCEDURE:  Diane [name] is a 74 year old female with past medical history significant for coronary artery disease, hypertension, diabetes mellitus, controlled by diet, history of recurrent AFib, degenerative joint disease, hypercholesteremia, history of CA of breast, history of well-versed CA, positive family history of coronary artery disease, depression.  She came to the ER complaining of recurrent retrosternal chest pain, which started yesterday afternoon described as tightness grade 6/10 associated with nausea, took sublingual nitro initially without relief, but then had recurrent chest pain, radiating occasionally to the left arm, so decided to come to the ED.  The patient denies any history of exertional chest pain, denies PND, orthopnea, or leg swelling.  Denies palpitation, lightheadedness, or syncope.  EKG done in the ED showed marked sinus bradycardia with occasional junctional escape rhythm on the monitor with poor R-wave progression in the anterior leads.  The patient was admitted to telemetry unit.  MI was ruled out by serial enzymes and EKG.  The patient subsequently underwent Lexiscan Myoview stress test which showed medium sized moderate CVRT reversible ischemia of the inferior wall with EF of 55%.  Due to recurrent chest pain, multiple risk factors, and positive nuclear stress test.  For reversible ischemia, discussed with the patient at length regarding left cath, possible PTCA stenting, its risks and  benefits, i.e., death, MI, stroke, need for emergency CABG, local vascular complications, etc. and consented for PCI.  DESCRIPTION OF PROCEDURE:  After obtaining the informed consent, the patient was brought to the cath lab and was placed on fluoroscopy table. Right groin was prepped and draped in usual fashion.  A 1% Xylocaine was used for local anesthesia in the right groin.  With the help of thin wall needle, 5-French arterial sheath was placed.  The sheath was aspirated and flushed.  Next, 5-French left Judkins catheter was advanced over the wire under fluoroscopic guidance up to the ascending aorta.  Wire was pulled out.  The catheter was aspirated and connected to the Manifold.  Catheter was further advanced and engaged into left coronary ostium.  Multiple views of the left system were taken.  Next, the catheter was disengaged and was pulled out over the wire and was replaced with 5-French right Judkins catheter, which was advanced over the wire under fluoroscopic guidance up to the ascending aorta.  Wire was pulled out.  The catheter was aspirated and connected to the Manifold.  Catheter was further advanced and engaged into right coronary ostium.  Multiple views of the right system were taken.  Next, catheter was disengaged and was pulled out over the wire and was replaced with 5- French pigtail catheter, which was advanced over the wire under fluoroscopic guidance up to the ascending aorta.  Wire was pulled out.  The catheter was aspirated and connected to the Manifold.  Catheter was further advanced across the aortic valve into the LV.  LV pressures were recorded.  Next, LV graft was done in 30-degree RAO position.  Post- angiographic pressures were recorded from LV and then pullback pressures were recorded from the aorta.  There was no gradient across the aortic valve.  Next, the pigtail catheter was pulled out over the wire. Sheaths were aspirated and flushed.  FINDINGS:  LV  showed good LV systolic function LVH.  There was 4+ MR, probably catheter induced.  Left main was patent.  LAD has 40-50% ostial stenosis and 30-40% mid stenosis.  Left circumflex is patent.  Ramus is moderate sized, which is patent.  RCA has 40-50% proximal stenosis and 10-15% mid stenosis.  PDA is patent.  PLV branch has 20-30% proximal stenosis.  The patient tolerated the procedure well.  There were no complications.  The patient was transferred to recovery room in stable condition.     Allegra Lai. Terrence Dupont, M.D.     MNH/MEDQ  D:  09/14/2014  T:  09/15/2014  Job:  [id number]

## 2014-09-15 NOTE — Discharge Summary (Signed)
Discharge summary dictated on 09/15/2014 dictation number is [id number]

## 2014-09-16 NOTE — Discharge Summary (Signed)
NAME:  Diane [name], Diane [name]           ACCOUNT NO.:  [id number]  MEDICAL RECORD NO.:  [id number]  LOCATION:  3W14C                        FACILITY:  [facility]  PHYSICIAN:  Allegra Lai. Terrence Dupont, M.D. DATE OF BIRTH:  [birth year]  DATE OF ADMISSION:  09/10/2014 DATE OF DISCHARGE:  09/15/2014                              DISCHARGE SUMMARY   ADMITTING DIAGNOSES: 1. Recurrent chest pain, worrisome for angina, rule out myocardial     infarction. 2. Marked sinus bradycardia secondary to medications. 3. Hypertension. 4. Diabetes mellitus. 5. History of paroxysmal atrial fibrillation in the past. 6. Hypercholesteremia. 7. Degenerative joint disease. 8. Depression. 9. Hypokalemia. 10.History of cancer of breast and history of cancer of vulva in the     past.  DISCHARGE HOME MEDICATIONS: 1. Amlodipine 5 mg one tablet daily. 2. Losartan 100 mg daily. 3. Pradaxa 75 mg twice daily. 4. Ferrous sulfate 325 mg daily. 5. Prozac 10 mg daily. 6. Neurontin 100 mg in the morning and afternoon and three tablets at     bedtime. 7. Norco one to two tablets as needed for pain as before. 8. Metformin 500 mg daily. 9. Nitrostat sublingual p.r.n. 10.Pravastatin 40 mg daily. 11.Trazodone 50 mg at bedtime.  The patient has been advised to stop     Bystolic and Dyazide.  DIET:  Low salt, low cholesterol, 1800 calories ADA diet.  Postcardiac cath instructions have been given.  FOLLOWUP:  Follow up with me in 1 week.  Follow up with PMD as scheduled.  CONDITION AT DISCHARGE:  Stable.  BRIEF HISTORY AND HOSPITAL COURSE:  Diane [name] is a 74 year old female with past medical history significant for coronary artery disease, hypertension, diabetes mellitus, history of paroxysmal atrial fibrillation, degenerative joint disease, hypercholesteremia, history of cancer of breast and cancer of vulva, positive family history of coronary artery disease and depression.  She came to the ER complaining of recurrent chest  pain, which started yesterday afternoon, described as tightness, grade 6/10, associated with nausea.  She took sublingual nitro initially without relief and then had recurrent chest pain, radiating to the left arm, so decided to come to ED.  The patient denies any history of exertional chest pain.  Denies PND, orthopnea or leg swelling.  Denies palpitation, lightheadedness, or syncope.  EKG done in the ED showed marked sinus bradycardia with junctional escape rhythm on the monitor with poor R-wave progression in the anterior leads.  First set of cardiac enzymes were negative.  PHYSICAL EXAMINATION:  GENERAL:  She was alert, awake and oriented x3. VITAL SIGNS:  Blood pressure when seen in the ED was 148/92, pulse 51. She was afebrile. HEENT:  Conjunctiva was pink. NECK:  Supple.  No JVD.  No bruit. LUNGS:  Clear to auscultation without rhonchi or rales. CARDIOVASCULAR:  She was bradycardic.  S1-S2 was soft.  There was soft systolic murmur.  No S3 or S4 gallop. ABDOMEN:  Soft.  Bowel sounds were present.  Nontender. EXTREMITIES:  There was no clubbing, cyanosis, or edema.  LABORATORY DATA:  Sodium was 140, potassium 3.8, BUN 23, creatinine 1.20.  Cholesterol was 126, triglycerides 84, LDL 65, HDL 44. Hemoglobin was 10, hematocrit 30.4, white count of 5.5.  The patient had nuclear  stress test on September 12, 2014, which showed reversible defect of inferior wall of medium size with moderate severity, EF of 55%.  BRIEF HOSPITAL COURSE:  The patient was admitted to telemetry unit.  MI was ruled out by serial enzymes and EKG.  The patient subsequently underwent cardiac catheterization yesterday late afternoon due to busy schedule.  The patient tolerated the procedure well.  There were no complications.  Postprocedure, the patient did not have any chest pains. Her groin is stable with no evidence of hematoma or bruit.  The patient is ambulating in the hallway and room without any problems.  The  patient had sill episodes of bradycardia, but heart rate stays above 50s.  The patient will be discharged home on above medications and will be followed up in my office in 1 week.     Allegra Lai. Terrence Dupont, M.D.     MNH/MEDQ  D:  09/15/2014  T:  09/16/2014  Job:  [id number]

## 2014-09-21 DIAGNOSIS — E669 Obesity, unspecified: Secondary | ICD-10-CM | POA: Diagnosis not present

## 2014-09-21 DIAGNOSIS — N189 Chronic kidney disease, unspecified: Secondary | ICD-10-CM | POA: Diagnosis not present

## 2014-09-21 DIAGNOSIS — I340 Nonrheumatic mitral (valve) insufficiency: Secondary | ICD-10-CM | POA: Diagnosis not present

## 2014-09-21 DIAGNOSIS — E119 Type 2 diabetes mellitus without complications: Secondary | ICD-10-CM | POA: Diagnosis not present

## 2014-09-21 DIAGNOSIS — I480 Paroxysmal atrial fibrillation: Secondary | ICD-10-CM | POA: Diagnosis not present

## 2014-09-21 DIAGNOSIS — I10 Essential (primary) hypertension: Secondary | ICD-10-CM | POA: Diagnosis not present

## 2014-09-21 DIAGNOSIS — M1990 Unspecified osteoarthritis, unspecified site: Secondary | ICD-10-CM | POA: Diagnosis not present

## 2014-09-21 DIAGNOSIS — E785 Hyperlipidemia, unspecified: Secondary | ICD-10-CM | POA: Diagnosis not present

## 2014-09-30 DIAGNOSIS — H25011 Cortical age-related cataract, right eye: Secondary | ICD-10-CM | POA: Diagnosis not present

## 2014-09-30 DIAGNOSIS — H2511 Age-related nuclear cataract, right eye: Secondary | ICD-10-CM | POA: Diagnosis not present

## 2014-10-02 DIAGNOSIS — I340 Nonrheumatic mitral (valve) insufficiency: Secondary | ICD-10-CM | POA: Diagnosis not present

## 2014-10-02 DIAGNOSIS — I480 Paroxysmal atrial fibrillation: Secondary | ICD-10-CM | POA: Diagnosis not present

## 2014-10-02 DIAGNOSIS — I10 Essential (primary) hypertension: Secondary | ICD-10-CM | POA: Diagnosis not present

## 2014-10-02 DIAGNOSIS — E119 Type 2 diabetes mellitus without complications: Secondary | ICD-10-CM | POA: Diagnosis not present

## 2014-10-13 DIAGNOSIS — I480 Paroxysmal atrial fibrillation: Secondary | ICD-10-CM | POA: Diagnosis not present

## 2014-10-13 DIAGNOSIS — I10 Essential (primary) hypertension: Secondary | ICD-10-CM | POA: Diagnosis not present

## 2014-10-13 DIAGNOSIS — E119 Type 2 diabetes mellitus without complications: Secondary | ICD-10-CM | POA: Diagnosis not present

## 2014-10-14 ENCOUNTER — Encounter: Payer: Self-pay | Admitting: Podiatry

## 2014-10-14 ENCOUNTER — Ambulatory Visit (INDEPENDENT_AMBULATORY_CARE_PROVIDER_SITE_OTHER): Payer: Medicare Other | Admitting: Podiatry

## 2014-10-14 VITALS — BP 165/84 | HR 67 | Temp 97.5°F | Resp 14

## 2014-10-14 DIAGNOSIS — R52 Pain, unspecified: Secondary | ICD-10-CM | POA: Diagnosis not present

## 2014-10-14 DIAGNOSIS — M7752 Other enthesopathy of left foot: Secondary | ICD-10-CM

## 2014-10-14 DIAGNOSIS — M6588 Other synovitis and tenosynovitis, other site: Secondary | ICD-10-CM

## 2014-10-14 NOTE — Patient Instructions (Signed)
Wear the ankle stabilizer left ankle/foot on a daily basis. Apply in the morning and remove at bedtime

## 2014-10-14 NOTE — Progress Notes (Signed)
   Subjective:    Patient ID: Diane [name], female    DOB: 10/13/1940, 74 y.o.   MRN: [id number]  HPI  N-sharpe when swelling  L-left medial side of ankle D- 2 months O- suddenly C-swelling once up and standing or driving A-any pressure T-elevating and soaking in epsom salt and warm water, did not help but felt good.   " Patient presents today with the medial side of leftanle which  is swollen and can get painful" Patient denies history of foot ulceration or claudication  Review of Systems  All other systems reviewed and are negative.      Objective:   Physical Exam  Orientated 3  Vascular: DP pulses 2/4 bilaterally due to PT pulses 2/4 bilaterally Capillary reflex immediate bilaterally Low-grade nonpitting edema medial left ankle    Neurological: Sensation to 10 g monofilament wire intact 5/5 bilaterally Vibratory sensation intact bilaterally Ankle reflex equal and reactive bilaterally  Dermatological: Texture and turgor within normal limits bilaterally No open skin lesions noted bilaterally The toenails are hypertrophic, brittle, discolored 6-10  Musculoskeletal: HAV deformities bilaterally Palpable tenderness retro-medial tendon area left ankle without any palpable lesions Patient is able to weakly heel off unilaterally and bilaterally Upon weight-bearing foot contour symmetrical without any evidence of too many toes signs left    Assessment & Plan:   Assessment: Satisfactory neurovascular status Medial left ankle tendinopathy  Plan: I reviewed the results with patient today An ankle stabilizer was dispensed to wear and left foot/ankle with instructions to wear in a continuous basis at the beginning a day and remove at bedtime 6 weeks   Patient is also requesting nail debridement at next visit  Reappoint 6 weeks

## 2014-10-15 DIAGNOSIS — H2512 Age-related nuclear cataract, left eye: Secondary | ICD-10-CM | POA: Diagnosis not present

## 2014-10-15 DIAGNOSIS — H25012 Cortical age-related cataract, left eye: Secondary | ICD-10-CM | POA: Diagnosis not present

## 2014-11-02 DIAGNOSIS — I480 Paroxysmal atrial fibrillation: Secondary | ICD-10-CM | POA: Diagnosis not present

## 2014-11-02 DIAGNOSIS — E785 Hyperlipidemia, unspecified: Secondary | ICD-10-CM | POA: Diagnosis not present

## 2014-11-02 DIAGNOSIS — E119 Type 2 diabetes mellitus without complications: Secondary | ICD-10-CM | POA: Diagnosis not present

## 2014-11-02 DIAGNOSIS — M1990 Unspecified osteoarthritis, unspecified site: Secondary | ICD-10-CM | POA: Diagnosis not present

## 2014-11-02 DIAGNOSIS — N189 Chronic kidney disease, unspecified: Secondary | ICD-10-CM | POA: Diagnosis not present

## 2014-11-02 DIAGNOSIS — I10 Essential (primary) hypertension: Secondary | ICD-10-CM | POA: Diagnosis not present

## 2014-11-02 DIAGNOSIS — E669 Obesity, unspecified: Secondary | ICD-10-CM | POA: Diagnosis not present

## 2014-11-04 DIAGNOSIS — H25013 Cortical age-related cataract, bilateral: Secondary | ICD-10-CM | POA: Diagnosis not present

## 2014-11-04 DIAGNOSIS — H2512 Age-related nuclear cataract, left eye: Secondary | ICD-10-CM | POA: Diagnosis not present

## 2014-11-24 ENCOUNTER — Ambulatory Visit: Payer: Medicare Other

## 2014-11-24 ENCOUNTER — Ambulatory Visit (INDEPENDENT_AMBULATORY_CARE_PROVIDER_SITE_OTHER): Payer: Medicare Other | Admitting: Podiatry

## 2014-11-24 DIAGNOSIS — B351 Tinea unguium: Secondary | ICD-10-CM | POA: Diagnosis not present

## 2014-11-24 DIAGNOSIS — E1140 Type 2 diabetes mellitus with diabetic neuropathy, unspecified: Secondary | ICD-10-CM

## 2014-11-24 DIAGNOSIS — M79673 Pain in unspecified foot: Secondary | ICD-10-CM

## 2014-11-24 NOTE — Progress Notes (Signed)
Subjective:     Patient ID: Diane [name], female   DOB: [birth year], 74 y.o.   MRN: [id number]  HPIThis patient presents to the office for treatment for for preventive foot care services.  She also had ankle pain 6 weeks ago which was treated with ankle brace.  She also request evaluation for diabetic shoes.   Review of Systems     Objective:   Physical Exam Objective: Review of past medical history, medications, social history and allergies were performed.  Vascular: Dorsalis pedis and posterior tibial pulses were palpable B/L, capillary refill was  WNL B/L, temperature gradient was WNL B/L   Skin:  No signs of symptoms of infection or ulcers on both feet  Nails: Thick disfigured discolored toenails both feet.  Sensory: Thornell Mule monifilament diminished.  Orthopedic: Orthopedic evaluation demonstrates all joints distal t ankle have full ROM without crepitus, muscle power WNL B/L.  No swelling retrocalcaneally left ankle.  HAV 1st MPJ B/L    Assessment:     Onychomycosis B/L.  Ankle Sprain left foot.     Plan:     Debridement of nails B/L.  Discussed ankle treatment.  There is no signs of swelling.  To be evaluated for diabetic shoes.

## 2014-12-07 ENCOUNTER — Encounter (HOSPITAL_COMMUNITY): Payer: Self-pay | Admitting: Emergency Medicine

## 2014-12-07 ENCOUNTER — Emergency Department (HOSPITAL_COMMUNITY): Payer: Medicare Other

## 2014-12-07 ENCOUNTER — Emergency Department (HOSPITAL_COMMUNITY)
Admission: EM | Admit: 2014-12-07 | Discharge: 2014-12-07 | Disposition: A | Discharge status: Home / Self Care | Payer: Medicare Other | Attending: Emergency Medicine | Admitting: Emergency Medicine

## 2014-12-07 ENCOUNTER — Emergency Department (INDEPENDENT_AMBULATORY_CARE_PROVIDER_SITE_OTHER)
Admission: EM | Admit: 2014-12-07 | Discharge: 2014-12-07 | Disposition: A | Discharge status: Nursing Home (Includes ICF) | Payer: Medicare Other | Source: Home / Self Care

## 2014-12-07 DIAGNOSIS — I480 Paroxysmal atrial fibrillation: Secondary | ICD-10-CM

## 2014-12-07 DIAGNOSIS — Z85828 Personal history of other malignant neoplasm of skin: Secondary | ICD-10-CM | POA: Insufficient documentation

## 2014-12-07 DIAGNOSIS — Z853 Personal history of malignant neoplasm of breast: Secondary | ICD-10-CM | POA: Insufficient documentation

## 2014-12-07 DIAGNOSIS — F329 Major depressive disorder, single episode, unspecified: Secondary | ICD-10-CM | POA: Diagnosis not present

## 2014-12-07 DIAGNOSIS — I209 Angina pectoris, unspecified: Secondary | ICD-10-CM

## 2014-12-07 DIAGNOSIS — F419 Anxiety disorder, unspecified: Secondary | ICD-10-CM | POA: Insufficient documentation

## 2014-12-07 DIAGNOSIS — Z9889 Other specified postprocedural states: Secondary | ICD-10-CM | POA: Diagnosis not present

## 2014-12-07 DIAGNOSIS — I2510 Atherosclerotic heart disease of native coronary artery without angina pectoris: Secondary | ICD-10-CM | POA: Diagnosis not present

## 2014-12-07 DIAGNOSIS — D509 Iron deficiency anemia, unspecified: Secondary | ICD-10-CM | POA: Diagnosis not present

## 2014-12-07 DIAGNOSIS — R0789 Other chest pain: Secondary | ICD-10-CM | POA: Diagnosis not present

## 2014-12-07 DIAGNOSIS — M1990 Unspecified osteoarthritis, unspecified site: Secondary | ICD-10-CM | POA: Diagnosis not present

## 2014-12-07 DIAGNOSIS — R079 Chest pain, unspecified: Secondary | ICD-10-CM

## 2014-12-07 DIAGNOSIS — Z79899 Other long term (current) drug therapy: Secondary | ICD-10-CM | POA: Diagnosis not present

## 2014-12-07 DIAGNOSIS — Z87891 Personal history of nicotine dependence: Secondary | ICD-10-CM | POA: Diagnosis not present

## 2014-12-07 DIAGNOSIS — I4891 Unspecified atrial fibrillation: Secondary | ICD-10-CM | POA: Diagnosis not present

## 2014-12-07 DIAGNOSIS — I10 Essential (primary) hypertension: Secondary | ICD-10-CM | POA: Insufficient documentation

## 2014-12-07 DIAGNOSIS — E785 Hyperlipidemia, unspecified: Secondary | ICD-10-CM | POA: Diagnosis not present

## 2014-12-07 DIAGNOSIS — E119 Type 2 diabetes mellitus without complications: Secondary | ICD-10-CM | POA: Diagnosis not present

## 2014-12-07 LAB — CBC WITH DIFFERENTIAL/PLATELET
Basophils Absolute: 0 10*3/uL (ref 0.0–0.1)
Basophils Relative: 0 % (ref 0–1)
Eosinophils Absolute: 0.2 10*3/uL (ref 0.0–0.7)
Eosinophils Relative: 3 % (ref 0–5)
HCT: 34 % — ABNORMAL LOW (ref 36.0–46.0)
Hemoglobin: 11.2 g/dL — ABNORMAL LOW (ref 12.0–15.0)
Lymphocytes Relative: 37 % (ref 12–46)
Lymphs Abs: 2.2 10*3/uL (ref 0.7–4.0)
MCH: 26.8 pg (ref 26.0–34.0)
MCHC: 32.9 g/dL (ref 30.0–36.0)
MCV: 81.3 fL (ref 78.0–100.0)
Monocytes Absolute: 0.5 10*3/uL (ref 0.1–1.0)
Monocytes Relative: 8 % (ref 3–12)
Neutro Abs: 3.1 10*3/uL (ref 1.7–7.7)
Neutrophils Relative %: 52 % (ref 43–77)
Platelets: 184 10*3/uL (ref 150–400)
RBC: 4.18 MIL/uL (ref 3.87–5.11)
RDW: 14.9 % (ref 11.5–15.5)
WBC: 6 10*3/uL (ref 4.0–10.5)

## 2014-12-07 LAB — I-STAT TROPONIN, ED
Troponin i, poc: 0 ng/mL (ref 0.00–0.08)
Troponin i, poc: 0.01 ng/mL (ref 0.00–0.08)

## 2014-12-07 LAB — BASIC METABOLIC PANEL
Anion gap: 10 (ref 5–15)
BUN: 16 mg/dL (ref 6–20)
CO2: 22 mmol/L (ref 22–32)
Calcium: 8.8 mg/dL — ABNORMAL LOW (ref 8.9–10.3)
Chloride: 106 mmol/L (ref 101–111)
Creatinine, Ser: 1.22 mg/dL — ABNORMAL HIGH (ref 0.44–1.00)
GFR calc Af Amer: 50 mL/min — ABNORMAL LOW (ref >60)
GFR calc non Af Amer: 43 mL/min — ABNORMAL LOW (ref >60)
Glucose, Bld: 108 mg/dL — ABNORMAL HIGH (ref 65–99)
Potassium: 3.2 mmol/L — ABNORMAL LOW (ref 3.5–5.1)
Sodium: 138 mmol/L (ref 135–145)

## 2014-12-07 MED ORDER — MORPHINE SULFATE 4 MG/ML IJ SOLN
4.0000 mg | Freq: Once | INTRAMUSCULAR | Status: AC
  Administered 2014-12-07: 4 mg via INTRAVENOUS
  Filled 2014-12-07: qty 1

## 2014-12-07 MED ORDER — ASPIRIN 81 MG PO CHEW
324.0000 mg | CHEWABLE_TABLET | Freq: Once | ORAL | Status: AC
  Administered 2014-12-07: 324 mg via ORAL
  Filled 2014-12-07: qty 4

## 2014-12-07 NOTE — ED Provider Notes (Signed)
CSN: [id number]     Arrival date & time 12/07/14  1753 History   First MD Initiated Contact with Patient 12/07/14 1755     Chief Complaint  Patient presents with  . Chest Pain     (Consider location/radiation/quality/duration/timing/severity/associated sxs/prior Treatment) HPI Comments: Patient with a history of CAD and Atrial fibrillation currently on Pradaxa presents today with a chief complaint of chest pain.  She states that she began having the pain around 2 PM today while talking to her daughter on the phone.  She states that the pain has been present since that time, but states that approximately every 5 minutes she gets a wave of intense pain that lasts for a few seconds.  She has taken 3 SL NG for the pain without relief.  She reports a history of chest pain, but states that the pain usually resolves with NG.  She was admitted for similar pain in April 2016.  She had a cardiac cath at that time, which showed LAD has 40-50% ostial stenosis and 30-40% mid stenosis.  RCA has 40-50% proximal stenosis and 10-15% mid stenosis.  She denies SOB, cough, fever, chills, abdominal pain, diaphoresis, nausea, vomiting, or LE edema/erythema.    Patient is a 74 y.o. female presenting with chest pain. The history is provided by the patient.  Chest Pain   Past Medical History  Diagnosis Date  . Lower back pain   . Herniated disc   . Arthritis   . Lumbar stenosis     L4-5  . Spondylolysis   . Coronary artery disease   . Hypertension   . Hyperlipemia   . 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 Breast cancer   . Depression   . Dysrhythmia     afib  . Shortness of breath     exertion  . Anxiety   . Diabetes mellitus without complication     diet  . Headache(784.0)   . Status post chemotherapy 11/04/2012 - 01/06/2013.    Docetaxel/Cytoxan Q 3 Weeks x 4 cycles  . S/P radiation therapy 01/23/2013-03/06/2013    Right breast / 46 Gy in 23 fractions / Right breast boost / 14 Gy in 7  fractions  . Atrial fibrillation   . Iron deficiency anemia   . Fibromyalgia   . Torn rotator cuff    Past Surgical History  Procedure Laterality Date  . Wide local excision  01/2010    Bilateral groin excisions, re-excision of vulva  . Posterior lumbar arthrodesis, pedicle screw fixation, posterolateral arthodesis  03/17/11  . Tonsillectomy    . Abdominal hysterectomy    . Dilation and curettage of uterus    . Tubal ligation    . Colonoscopy    . Shoulder arthroscopy with rotator cuff repair and subacromial decompression  06/04/2012    Procedure: SHOULDER ARTHROSCOPY WITH ROTATOR CUFF REPAIR AND SUBACROMIAL DECOMPRESSION;  Surgeon: Nita Sells, MD;  Location: [place];  Service: Orthopedics;  Laterality: Left;  LEFT SHOULDER ARTHROSCOPY WITH ROTATOR CUFF REPAIR AND SUBACROMIAL DECOMPRESSION AND DISTAL CLAVICLE RESECTION  . Partial mastectomy with needle localization and axillary sentinel lymph node bx Right 10/08/2012    Procedure: RIGHT PARTIAL MASTECTOMY WITH NEEDLE LOCALIZATION AND RIGHT AXILLARY SENTINEL LYMPH NODE BX;  Surgeon: Adin Hector, MD;  Location: [place];  Service: General;  Laterality: Right;  Injection of 1% Methylene Blue dye into right breast  . Portacath placement Left 10/08/2012    Procedure:  INSERTION PORT-A-CATH WITH ULTRASOUND GUIDANCE;  Surgeon: Adin Hector, MD;  Location: [place];  Service: General;  Laterality: Left;  Using 84F [place]  . Vulvectomy partial    . Left heart catheterization with coronary angiogram N/A 09/14/2014    Procedure: LEFT HEART CATHETERIZATION WITH CORONARY ANGIOGRAM;  Surgeon: [name], MD;  Location: [facility] C[facility];  Service: Cardiovascular;  Laterality: N/A;   Family History  Problem Relation Age of Onset  . Stroke Mother   . Alzheimer's disease Mother   . Heart attack Father   . Heart attack Brother    History  Substance Use Topics  . Smoking status: Former Smoker -- 50 years    Types:  Cigarettes    Quit date: 05/29/2009  . Smokeless tobacco: Never Used  . Alcohol Use: No   OB History    No data available     Review of Systems  Cardiovascular: Positive for chest pain.  All other systems reviewed and are negative.     Allergies  Shellfish allergy  Home Medications   Prior to Admission medications   Medication Sig Start Date End Date Taking? Authorizing Provider  amLODipine (NORVASC) 5 MG tablet Take 1 tablet (5 mg total) by mouth daily. 09/15/14   [name], MD  dabigatran (PRADAXA) 75 MG CAPS Take 75 mg by mouth every 12 (twelve) hours.    Historical Provider, MD  ferrous sulfate 325 (65 FE) MG tablet Take 325 mg by mouth daily with breakfast.    Historical Provider, MD  FLUoxetine (PROZAC) 10 MG capsule Take 10 mg by mouth daily.    Historical Provider, MD  gabapentin (NEURONTIN) 100 MG capsule 1 tab by mouth in morning, 1 tab by mouth mid-day, and 3 tabs by mouth at bedtime 03/05/13   Amy Milda Smart, PA-C  HYDROcodone-acetaminophen (NORCO/VICODIN) 5-325 MG per tablet Take 1-2 tablets by mouth every 4 (four) hours as needed for pain. 10/08/12   [name], MD  losartan (COZAAR) 100 MG tablet Take 1 tablet (100 mg total) by mouth daily. 09/15/14   [name], MD  metFORMIN (GLUCOPHAGE-XR) 500 MG 24 hr tablet Take 1 tablet (500 mg total) by mouth daily with breakfast. Total dose of 1000mg  daily 09/16/14   [name], MD  NITROSTAT 0.4 MG SL tablet Place 0.4 mg under the tongue every 5 (five) minutes as needed for chest pain.  10/14/12   Historical Provider, MD  potassium chloride (K-DUR) 10 MEQ tablet  09/04/14   Historical Provider, MD  pravastatin (PRAVACHOL) 40 MG tablet Take 40 mg by mouth every evening.    Historical Provider, MD  PROLENSA 0.07 % SOLN  09/07/14   Historical Provider, MD  traZODone (DESYREL) 50 MG tablet Take 50 mg by mouth at bedtime.    Historical Provider, MD  triamterene-hydrochlorothiazide (DYAZIDE) 37.5-25 MG per capsule  08/10/14    Historical Provider, MD   BP 146/81 mmHg  Pulse 74  Temp(Src) 98.1 F (36.7 C) (Oral)  Resp 19  Ht 5' 7.5" (1.715 m)  Wt 200 lb 1.6 oz (90.765 kg)  BMI 30.86 kg/m2  SpO2 100% Physical Exam  Constitutional: She appears well-developed and well-nourished.  HENT:  Head: Normocephalic and atraumatic.  Mouth/Throat: Oropharynx is clear and moist.  Neck: Normal range of motion. Neck supple.  Cardiovascular: Normal rate, regular rhythm and normal heart sounds.   Pulses:      Dorsalis pedis pulses are 2+ on the right side, and 2+ on the left side.  Pulmonary/Chest: Effort normal and breath sounds normal. No respiratory distress. She has no wheezes. She has no rales.  Abdominal: Soft. There is no tenderness.  Musculoskeletal: Normal range of motion.  No LE edema or erythema bilaterally  Neurological: She is alert.  Skin: Skin is warm and dry.  Psychiatric: She has a normal mood and affect.  Nursing note and vitals reviewed.   ED Course  Procedures (including critical care time) Labs Review Labs Reviewed  CBC WITH DIFFERENTIAL/PLATELET  BASIC METABOLIC PANEL  Randolm Idol, ED    Imaging Review Dg Chest 2 View  12/07/2014   CLINICAL DATA:  Intermittent chest pain with little response to nitroglycerin. History of chronic atrial fibrillation, hypertension and coronary artery disease. Initial encounter.  EXAM: CHEST  2 VIEW  COMPARISON:  10/08/2012.  FINDINGS: Stable cardiomegaly and aortic atherosclerosis. There is mild vascular congestion with overall improved aeration of the lungs compared with the most recent portable examination. No edema, confluent airspace opacity or significant pleural effusion demonstrated. Postsurgical changes noted in the right breast. The bones appear unremarkable.  IMPRESSION: Cardiomegaly with chronic vascular congestion. No edema or significant pleural effusion.   Electronically Signed   By: Richardean Sale M.D.   On: 12/07/2014 19:28     EKG  Interpretation   Date/Time:  Monday December 07 2014 18:06:30 EDT Ventricular Rate:  56 PR Interval:    QRS Duration: 86 QT Interval:  412 QTC Calculation: 398 R Axis:   5 Text Interpretation:  Atrial fibrillation Low voltage, precordial leads  Borderline T wave abnormalities Atrial fibrillation T wave abnormality  Abnormal ekg Confirmed by Carmin Muskrat  MD ([id number]) on 12/07/2014 6:23:07  PM      7:30 PM Reassessed patient.  She reports that her pain has resolved at this time.  8:01 PM Discussed with Dr. Terrence Dupont.  He recommends delta troponin and discharge if negative.  He states that he will follow up with the patient in the office tomorrow. MDM   Final diagnoses:  Chest pain   Patient with a history of CAD presents today with a chief complaint of chest pain.  Pain is atypical.  Pain resolved in the ED.  No ischemic changes on EKG.  EKG showing Atrial Fibrillation, but patient has a history of the same.  She is currently on Pradaxa.  Initial and delta troponin are negative.  CXR negative for acute findings.  Patient had a recent cath in April of 2016.  Patient discussed with Dr. Terrence Dupont.  He recommended follow up in the office tomorrow.  Plan discussed with patient who is in agreement with the plan.  Patient stable for discharge.  Return precautions given.    Hyman Bible, PA-C 12/07/14 2232  Carmin Muskrat, MD 12/08/14 [phone]

## 2014-12-07 NOTE — ED Provider Notes (Addendum)
CSN: [id number]     Arrival date & time 12/07/14  1625 History   First MD Initiated Contact with Patient 12/07/14 1645     Chief Complaint  Patient presents with  . Chest Pain   (Consider location/radiation/quality/duration/timing/severity/associated sxs/prior Treatment) The history is provided by the patient.   this is a 74 year old woman with known heart disease. She's also had breast cancer.  She went to pick up her daughter at work today started experiencing substernal chest pain. She took 2 nitroglycerin and the pain persisted. She arrived here at the urgent care center hoping to have her daughter seen, as well in the waiting room she took another nitroglycerin and reported to the staff that she was having chest pains.  She is a patient of Dr. Terrence Dupont. She's had past problems with bradycardia. She has chronic left leg edema and a problem with her left knee.  Past Medical History  Diagnosis Date  . Lower back pain   . Herniated disc   . Arthritis   . Lumbar stenosis     L4-5  . Spondylolysis   . Coronary artery disease   . Hypertension   . Hyperlipemia   . 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 Breast cancer   . Depression   . Dysrhythmia     afib  . Shortness of breath     exertion  . Anxiety   . Diabetes mellitus without complication     diet  . Headache(784.0)   . Status post chemotherapy 11/04/2012 - 01/06/2013.    Docetaxel/Cytoxan Q 3 Weeks x 4 cycles  . S/P radiation therapy 01/23/2013-03/06/2013    Right breast / 46 Gy in 23 fractions / Right breast boost / 14 Gy in 7 fractions  . Atrial fibrillation   . Iron deficiency anemia   . Fibromyalgia   . Torn rotator cuff    Past Surgical History  Procedure Laterality Date  . Wide local excision  01/2010    Bilateral groin excisions, re-excision of vulva  . Posterior lumbar arthrodesis, pedicle screw fixation, posterolateral arthodesis  03/17/11  . Tonsillectomy    . Abdominal hysterectomy    . Dilation  and curettage of uterus    . Tubal ligation    . Colonoscopy    . Shoulder arthroscopy with rotator cuff repair and subacromial decompression  06/04/2012    Procedure: SHOULDER ARTHROSCOPY WITH ROTATOR CUFF REPAIR AND SUBACROMIAL DECOMPRESSION;  Surgeon: Nita Sells, MD;  Location: [place];  Service: Orthopedics;  Laterality: Left;  LEFT SHOULDER ARTHROSCOPY WITH ROTATOR CUFF REPAIR AND SUBACROMIAL DECOMPRESSION AND DISTAL CLAVICLE RESECTION  . Partial mastectomy with needle localization and axillary sentinel lymph node bx Right 10/08/2012    Procedure: RIGHT PARTIAL MASTECTOMY WITH NEEDLE LOCALIZATION AND RIGHT AXILLARY SENTINEL LYMPH NODE BX;  Surgeon: Adin Hector, MD;  Location: [place];  Service: General;  Laterality: Right;  Injection of 1% Methylene Blue dye into right breast  . Portacath placement Left 10/08/2012    Procedure: INSERTION PORT-A-CATH WITH ULTRASOUND GUIDANCE;  Surgeon: Adin Hector, MD;  Location: [place];  Service: General;  Laterality: Left;  Using 29F [place]  . Vulvectomy partial    . Left heart catheterization with coronary angiogram N/A 09/14/2014    Procedure: LEFT HEART CATHETERIZATION WITH CORONARY ANGIOGRAM;  Surgeon: [name], MD;  Location: [facility] CATH LAB;  Service: Cardiovascular;  Laterality: N/A;   Family History  Problem Relation Age of  Onset  . Stroke Mother   . Alzheimer's disease Mother   . Heart attack Father   . Heart attack Brother    History  Substance Use Topics  . Smoking status: Former Smoker -- 50 years    Types: Cigarettes    Quit date: 05/29/2009  . Smokeless tobacco: Never Used  . Alcohol Use: No   OB History    No data available     Review of Systems  Constitutional: Negative.   HENT: Negative.   Eyes: Negative.   Respiratory: Negative.   Cardiovascular: Positive for chest pain, palpitations and leg swelling.  Gastrointestinal: Negative.   Genitourinary: Negative.   Musculoskeletal:  Negative.   Skin: Negative.   Psychiatric/Behavioral: Negative.     Allergies  Shellfish allergy  Home Medications   Prior to Admission medications   Medication Sig Start Date End Date Taking? Authorizing Provider  amLODipine (NORVASC) 5 MG tablet Take 1 tablet (5 mg total) by mouth daily. 09/15/14   [name], MD  dabigatran (PRADAXA) 75 MG CAPS Take 75 mg by mouth every 12 (twelve) hours.    Historical Provider, MD  ferrous sulfate 325 (65 FE) MG tablet Take 325 mg by mouth daily with breakfast.    Historical Provider, MD  FLUoxetine (PROZAC) 10 MG capsule Take 10 mg by mouth daily.    Historical Provider, MD  gabapentin (NEURONTIN) 100 MG capsule 1 tab by mouth in morning, 1 tab by mouth mid-day, and 3 tabs by mouth at bedtime 03/05/13   Amy Milda Smart, PA-C  HYDROcodone-acetaminophen (NORCO/VICODIN) 5-325 MG per tablet Take 1-2 tablets by mouth every 4 (four) hours as needed for pain. 10/08/12   [name], MD  losartan (COZAAR) 100 MG tablet Take 1 tablet (100 mg total) by mouth daily. 09/15/14   [name], MD  metFORMIN (GLUCOPHAGE-XR) 500 MG 24 hr tablet Take 1 tablet (500 mg total) by mouth daily with breakfast. Total dose of 1000mg  daily 09/16/14   [name], MD  NITROSTAT 0.4 MG SL tablet Place 0.4 mg under the tongue every 5 (five) minutes as needed for chest pain.  10/14/12   Historical Provider, MD  potassium chloride (K-DUR) 10 MEQ tablet  09/04/14   Historical Provider, MD  pravastatin (PRAVACHOL) 40 MG tablet Take 40 mg by mouth every evening.    Historical Provider, MD  PROLENSA 0.07 % SOLN  09/07/14   Historical Provider, MD  traZODone (DESYREL) 50 MG tablet Take 50 mg by mouth at bedtime.    Historical Provider, MD  triamterene-hydrochlorothiazide (DYAZIDE) 37.5-25 MG per capsule  08/10/14   Historical Provider, MD   BP 145/68 mmHg  Pulse 58  Temp(Src) 98.5 F (36.9 C) (Oral)  Resp 18  SpO2 100% Physical Exam  Constitutional: She is oriented to person, place,  and time. She appears well-developed and well-nourished.  HENT:  Head: Normocephalic and atraumatic.  Right Ear: External ear normal.  Left Ear: External ear normal.  Nose: Nose normal.  Mouth/Throat: Oropharynx is clear and moist.  Eyes: Conjunctivae and EOM are normal. Pupils are equal, round, and reactive to light.  Neck: Normal range of motion. Neck supple. No thyromegaly present.  Cardiovascular: Intact distal pulses.   Irregularly irregular rhythm  Pulmonary/Chest: Effort normal and breath sounds normal.  Abdominal: Soft. Bowel sounds are normal.  Musculoskeletal:  Brace located on left knee  Lymphadenopathy:    She has no cervical adenopathy.  Neurological: She is alert and oriented to person, place, and time.  Skin: Skin is warm.  Psychiatric: She has a normal mood and affect.  Vitals reviewed. ED ECG REPORT   Date: 12/07/2014  Rate: Irregular  Rhythm: atrial fibrillation  QRS Axis: normal  Intervals: Normal PR interval  ST/T Wave abnormalities: nonspecific ST changes  Conduction Disutrbances:none  Narrative Interpretation:   Old EKG Reviewed: changes noted  I have personally reviewed the EKG tracing and agree with the computerized printout as noted.   ED Course  Procedures (including critical care time) Patient transferred to the emergency department because of ongoing chest pain  MDM  New-onset chest pain unresponsive to nitroglycerin. New-onset atrial fibrillation when compared to old EKG available. Patient will be transferred to ED for further evaluation.  Robyn Haber, MD    Robyn Haber, MD 12/07/14 1707  Robyn Haber, MD 12/07/14 [phone]

## 2014-12-07 NOTE — ED Notes (Signed)
Patient is gone to xray 

## 2014-12-07 NOTE — ED Notes (Signed)
Left chest, under left breast pain, intermittently sharp.  Patient reports this started this afternoon prior to leaving home.  Patient reports taking 2 nitro at home and one on the way here- patient has had 3 nitro since pain started.  Patient has had swelling in left leg for a month.

## 2014-12-07 NOTE — ED Notes (Signed)
Patient and adult daughter seen in the same room, same physician.

## 2014-12-07 NOTE — ED Notes (Signed)
Pt here from [facility] with intermittent CP coming approx q51min then resolving. Pt reports taking 3 nitro with little to no relied PTA. Pt has chronic afib, HTN, CAD.

## 2014-12-07 NOTE — ED Notes (Signed)
Notified [facility] 

## 2014-12-07 NOTE — ED Notes (Signed)
2 liters nasal cannula placed on patient

## 2014-12-07 NOTE — ED Notes (Signed)
Pt ambulated to restroom with steady gait.

## 2014-12-07 NOTE — Discharge Instructions (Signed)

## 2014-12-09 DIAGNOSIS — I481 Persistent atrial fibrillation: Secondary | ICD-10-CM | POA: Diagnosis not present

## 2014-12-09 DIAGNOSIS — I10 Essential (primary) hypertension: Secondary | ICD-10-CM | POA: Diagnosis not present

## 2014-12-09 DIAGNOSIS — E785 Hyperlipidemia, unspecified: Secondary | ICD-10-CM | POA: Diagnosis not present

## 2014-12-09 DIAGNOSIS — I2510 Atherosclerotic heart disease of native coronary artery without angina pectoris: Secondary | ICD-10-CM | POA: Diagnosis not present

## 2014-12-09 DIAGNOSIS — M1990 Unspecified osteoarthritis, unspecified site: Secondary | ICD-10-CM | POA: Diagnosis not present

## 2014-12-09 DIAGNOSIS — E669 Obesity, unspecified: Secondary | ICD-10-CM | POA: Diagnosis not present

## 2014-12-09 DIAGNOSIS — E119 Type 2 diabetes mellitus without complications: Secondary | ICD-10-CM | POA: Diagnosis not present

## 2014-12-09 DIAGNOSIS — R0789 Other chest pain: Secondary | ICD-10-CM | POA: Diagnosis not present

## 2014-12-09 DIAGNOSIS — N189 Chronic kidney disease, unspecified: Secondary | ICD-10-CM | POA: Diagnosis not present

## 2014-12-30 ENCOUNTER — Encounter: Payer: Self-pay | Admitting: Gynecologic Oncology

## 2014-12-30 ENCOUNTER — Ambulatory Visit: Payer: Medicare Other | Attending: Gynecologic Oncology | Admitting: Gynecologic Oncology

## 2014-12-30 VITALS — BP 138/78 | HR 64 | Temp 98.0°F | Resp 18 | Ht 67.5 in | Wt 199.4 lb

## 2014-12-30 DIAGNOSIS — C519 Malignant neoplasm of vulva, unspecified: Secondary | ICD-10-CM

## 2014-12-30 NOTE — Progress Notes (Signed)
Consult Note: Gyn-Onc  [place] D Grandstaff 74 y.o. female  CC:  Chief Complaint  Patient presents with  . Vulvar Cancer    follow-up    HPI: Ms. 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 That was a new diagnosis. She subsequently underwent bilateral groin excisions and re-excision of the vulva in September 2011. All lymph nodes were negative and the re-excision site showed no residual carcinoma. I last saw her in October  2015, at which time her exam was unremarkable. There was no evidence of recurrent disease. She comes in today for followup.   She had a routine screening mammography on August 26, 2012 it showed a possible distortion in the right breast. Additional views April 3 confirmed a spiculated mass in the right breast measuring 9 mm. It was not palpable. She underwent biopsy on 09/04/2012 that revealed an invasive ductal carcinoma, grade 3, triple negative. The patient then had 4 cycles of chemotherapy with docetaxel and Cytoxan given in the adjuvant setting. She completed that as well as radiation therapy to the right breast.  Interval History:  She is overall doing fairly well. She was hospitalized in April and had a cardiac catheterization for an irregular heartbeat. She was diagnosed with atrophic ablation. She states it is doing much better than it had been. She primarily feels that night when she is lying down. She also feels some wheezing at night. At night when she lays down she feels some pulsations in her head but is not a true headache. She denies any chest pain. She does have shortness of breath with exertion which is long-standing and stable. She states that she has not been diagnosed her told that she has any anemia. She states that she has follow-up later this month with Dr. Jana Hakim and that lab work is planned. She is complaining of some pain in the L4-L5 region. She has been  seen for this and they recommended proceeding with steroidal injections rather than surgery in light of her new cardiac issues. From a gynecologic perspective she has no complaints. She denies any vaginal bleeding or discharge pain tenderness or itching. She is very pleased to reach her 5 year anniversary.  Review of Systems:   Constitutional: As above.  Denies fever. Skin: No rash Cardiovascular: No chest pain, +shortness of breath, or edema  Pulmonary: + shortness of breath Gastro Intestinal:  No nausea, vomiting, constipation, or diarrhea reported. No bright red blood per rectum. Genitourinary: No frequency, urgency, or dysuria.  Denies vaginal bleeding and discharge. No vulvar lesions or pain. Musculoskeletal: No myalgia, arthralgia, joint swelling or pain.  Neurologic:+ low back pain Psychology: Doing well    Current Meds:  Outpatient Encounter Prescriptions as of 12/30/2014  Medication Sig  . amiodarone (PACERONE) 200 MG tablet Take 200 mg by mouth daily.   . dabigatran (PRADAXA) 75 MG CAPS Take 75 mg by mouth every 12 (twelve) hours.  . ferrous sulfate 325 (65 FE) MG tablet Take 325 mg by mouth daily with breakfast.  . FLUoxetine (PROZAC) 10 MG capsule Take 10 mg by mouth daily.  [name] Kitchen HYDROcodone-acetaminophen (NORCO/VICODIN) 5-325 MG per tablet Take 1-2 tablets by mouth every 4 (four) hours as needed for pain.  [name] Kitchen losartan (COZAAR) 100 MG tablet Take 1 tablet (100 mg total) by mouth daily.  . metFORMIN (GLUCOPHAGE-XR) 500 MG 24 hr tablet Take 1 tablet (500 mg total) by mouth daily  with breakfast. Total dose of 1000mg  daily  . potassium chloride (K-DUR) 10 MEQ tablet Take 10 mEq by mouth daily.   . pravastatin (PRAVACHOL) 40 MG tablet Take 40 mg by mouth every evening.  . traZODone (DESYREL) 50 MG tablet Take 50 mg by mouth at bedtime.  . triamterene-hydrochlorothiazide (DYAZIDE) 37.5-25 MG per capsule Take 1 capsule by mouth daily.   [name] Kitchen gabapentin (NEURONTIN) 100 MG capsule 1 tab by  mouth in morning, 1 tab by mouth mid-day, and 3 tabs by mouth at bedtime (Patient not taking: Reported on 12/07/2014)  . NITROSTAT 0.4 MG SL tablet Place 0.4 mg under the tongue every 5 (five) minutes as needed for chest pain.   . [DISCONTINUED] amLODipine (NORVASC) 5 MG tablet Take 1 tablet (5 mg total) by mouth daily.   No facility-administered encounter medications on file as of 12/30/2014.    Allergy:  Allergies  Allergen Reactions  . Shellfish Allergy Swelling    Social Hx:   History   Social History  . Marital Status: Widowed    Spouse Name: N/A  . Number of Children: N/A  . Years of Education: N/A   Occupational History  . Not on file.   Social History Main Topics  . Smoking status: Former Smoker -- 50 years    Types: Cigarettes    Quit date: 05/29/2009  . Smokeless tobacco: Never Used  . Alcohol Use: No  . Drug Use: No  . Sexual Activity: Not on file   Other Topics Concern  . Not on file   Social History Narrative   Tobacco use cigarettes: former smoker, quit in year Sept 2011, Pack-year Hx: 82, tobacco history last updated 10/10/2013. No smoking. Per patient stopped smoking 9 months ago. No alcohol. Caffeine.: yes, 2+ servings daily, tea. No recreational drug use. Exercise: [employer] water [employer]. Marital status: widow.    Past Surgical Hx:  Past Surgical History  Procedure Laterality Date  . Wide local excision  01/2010    Bilateral groin excisions, re-excision of vulva  . Posterior lumbar arthrodesis, pedicle screw fixation, posterolateral arthodesis  03/17/11  . Tonsillectomy    . Abdominal hysterectomy    . Dilation and curettage of uterus    . Tubal ligation    . Colonoscopy    . Shoulder arthroscopy with rotator cuff repair and subacromial decompression  06/04/2012    Procedure: SHOULDER ARTHROSCOPY WITH ROTATOR CUFF REPAIR AND SUBACROMIAL DECOMPRESSION;  Surgeon: Nita Sells, MD;  Location: [place];  Service: Orthopedics;   Laterality: Left;  LEFT SHOULDER ARTHROSCOPY WITH ROTATOR CUFF REPAIR AND SUBACROMIAL DECOMPRESSION AND DISTAL CLAVICLE RESECTION  . Partial mastectomy with needle localization and axillary sentinel lymph node bx Right 10/08/2012    Procedure: RIGHT PARTIAL MASTECTOMY WITH NEEDLE LOCALIZATION AND RIGHT AXILLARY SENTINEL LYMPH NODE BX;  Surgeon: Adin Hector, MD;  Location: [facility];  Service: General;  Laterality: Right;  Injection of 1% Methylene Blue dye into right breast  . Portacath placement Left 10/08/2012    Procedure: INSERTION PORT-A-CATH WITH ULTRASOUND GUIDANCE;  Surgeon: Adin Hector, MD;  Location: [place];  Service: General;  Laterality: Left;  Using 27F [place]  . Vulvectomy partial    . Left heart catheterization with coronary angiogram N/A 09/14/2014    Procedure: LEFT HEART CATHETERIZATION WITH CORONARY ANGIOGRAM;  Surgeon: [name], MD;  Location: [facility] CATH LAB;  Service: Cardiovascular;  Laterality: N/A;    Past Medical Hx:  Past Medical History  Diagnosis Date  .  Lower back pain   . Herniated disc   . Arthritis   . Lumbar stenosis     L4-5  . Spondylolysis   . Coronary artery disease   . Hypertension   . Hyperlipemia   . 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 Breast cancer   . Depression   . Dysrhythmia     afib  . Shortness of breath     exertion  . Anxiety   . Diabetes mellitus without complication     diet  . Headache(784.0)   . Status post chemotherapy 11/04/2012 - 01/06/2013.    Docetaxel/Cytoxan Q 3 Weeks x 4 cycles  . S/P radiation therapy 01/23/2013-03/06/2013    Right breast / 46 Gy in 23 fractions / Right breast boost / 14 Gy in 7 fractions  . Atrial fibrillation   . Iron deficiency anemia   . Fibromyalgia   . Torn rotator cuff     Family Hx:  Family History  Problem Relation Age of Onset  . Stroke Mother   . Alzheimer's disease Mother   . Heart attack Father   . Heart attack Brother     Vitals:  Blood pressure  138/78, pulse 64, temperature 98 F (36.7 C), temperature source Oral, resp. rate 18, height 5' 7.5" (1.715 m), weight 199 lb 6.4 oz (90.447 kg), SpO2 100 %.  Physical Exam:   GENERAL: Well-nourished, well-developed female in no acute distress.   NECK: Supple. There is no lymphadenopathy, no thyromegaly.   LUNGS: Clear to auscultation bilaterally.   CARDIOVASCULAR: Irregular rate and rhythm. 2/6 SEM  ABDOMEN: Soft, nontender, nondistended. No palpable masses or hepatosplenomegaly. Groins are negative for adenopathy.   EXTREMITIES: There is no edema.   PELVIC: External genitalia is notable for surgical excisions. There is no evidence of any visible lesions. Palpation along the vulva and the inner thigh reveals no masses and I cannot reproduce her pain. Bimanual examination reveals no thickening, masses, nodularity in the perineum or the vulva. Speculum exam reveals a normal appearing vagina. The vaginal cuff is without lesions. Bimanual exam is negative. Rectal confirms.  Assessment/Plan:  ASSESSMENT: 74 year old with a stage IB squamous cell carcinoma of vulva, who clinically has no evidence of recurrent disease, > 5 (7/11) years out from her diagnosis. She is very pleased with how she has done. She'll be released from our clinic. She knows that we'll be happy to see her in the future should the need arise. She requested name of local gynecologist for follow-up and these were provided to her. GEHRIG,PAOLA A., MD 12/30/2014, 11:01 AM

## 2014-12-30 NOTE — Patient Instructions (Signed)
Plan to follow up with a gyn of your choosing in one year or sooner if needed.  Please call for any questions or concerns.  Plan to follow up with Dr. Alycia Rossetti as needed in the future.

## 2014-12-31 ENCOUNTER — Ambulatory Visit: Payer: Medicare Other | Admitting: Gynecologic Oncology

## 2015-01-11 ENCOUNTER — Other Ambulatory Visit (HOSPITAL_BASED_OUTPATIENT_CLINIC_OR_DEPARTMENT_OTHER): Payer: Medicare Other

## 2015-01-11 ENCOUNTER — Encounter: Payer: Self-pay | Admitting: *Deleted

## 2015-01-11 DIAGNOSIS — C50411 Malignant neoplasm of upper-outer quadrant of right female breast: Secondary | ICD-10-CM

## 2015-01-11 DIAGNOSIS — C50911 Malignant neoplasm of unspecified site of right female breast: Secondary | ICD-10-CM

## 2015-01-11 LAB — COMPREHENSIVE METABOLIC PANEL (CC13)
ALT: 10 U/L (ref 0–55)
AST: 12 U/L (ref 5–34)
Albumin: 3.5 g/dL (ref 3.5–5.0)
Alkaline Phosphatase: 75 U/L (ref 40–150)
Anion Gap: 8 mEq/L (ref 3–11)
BUN: 18.4 mg/dL (ref 7.0–26.0)
CO2: 23 mEq/L (ref 22–29)
Calcium: 8.8 mg/dL (ref 8.4–10.4)
Chloride: 108 mEq/L (ref 98–109)
Creatinine: 1.4 mg/dL — ABNORMAL HIGH (ref 0.6–1.1)
EGFR: 45 mL/min/{1.73_m2} — ABNORMAL LOW (ref >90)
Glucose: 99 mg/dl (ref 70–140)
Potassium: 3.4 mEq/L — ABNORMAL LOW (ref 3.5–5.1)
Sodium: 138 mEq/L (ref 136–145)
Total Bilirubin: 0.57 mg/dL (ref 0.20–1.20)
Total Protein: 7 g/dL (ref 6.4–8.3)

## 2015-01-11 LAB — CBC WITH DIFFERENTIAL/PLATELET
BASO%: 1.4 % (ref 0.0–2.0)
Basophils Absolute: 0.1 10*3/uL (ref 0.0–0.1)
EOS%: 2.1 % (ref 0.0–7.0)
Eosinophils Absolute: 0.1 10*3/uL (ref 0.0–0.5)
HCT: 33.1 % — ABNORMAL LOW (ref 34.8–46.6)
HGB: 10.6 g/dL — ABNORMAL LOW (ref 11.6–15.9)
LYMPH%: 28.1 % (ref 14.0–49.7)
MCH: 27.1 pg (ref 25.1–34.0)
MCHC: 32.2 g/dL (ref 31.5–36.0)
MCV: 84.1 fL (ref 79.5–101.0)
MONO#: 0.5 10*3/uL (ref 0.1–0.9)
MONO%: 9.3 % (ref 0.0–14.0)
NEUT#: 2.9 10*3/uL (ref 1.5–6.5)
NEUT%: 59.1 % (ref 38.4–76.8)
Platelets: 207 10*3/uL (ref 145–400)
RBC: 3.93 10*6/uL (ref 3.70–5.45)
RDW: 16 % — ABNORMAL HIGH (ref 11.2–14.5)
WBC: 4.9 10*3/uL (ref 3.9–10.3)
lymph#: 1.4 10*3/uL (ref 0.9–3.3)

## 2015-01-11 NOTE — Progress Notes (Signed)
[place] Social Work  Clinical Social Work was referred by patient to review and complete healthcare advance directives.  Clinical Social Worker met with patient in [place] office.  The patient designated Lonia Blood as their primary healthcare agent.  Patient also completed healthcare living will.    Clinical Social Worker notarized documents and made copies for patient/family. Clinical Social Worker will send documents to medical records to be scanned into patient's chart. Clinical Social Worker encouraged patient/family to contact with any additional questions or concerns.  Johnnye Lana, MSW, LCSW, OSW-C Clinical Social Worker [facility] [phone]

## 2015-01-13 ENCOUNTER — Encounter: Payer: Self-pay | Admitting: Podiatry

## 2015-01-13 ENCOUNTER — Ambulatory Visit (INDEPENDENT_AMBULATORY_CARE_PROVIDER_SITE_OTHER): Payer: Medicare Other | Admitting: Podiatry

## 2015-01-13 DIAGNOSIS — E1140 Type 2 diabetes mellitus with diabetic neuropathy, unspecified: Secondary | ICD-10-CM

## 2015-01-13 NOTE — Progress Notes (Signed)
   Subjective:    Patient ID: Diane [name], female    DOB: 26-Jan-1941, 74 y.o.   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

## 2015-01-18 ENCOUNTER — Ambulatory Visit (HOSPITAL_BASED_OUTPATIENT_CLINIC_OR_DEPARTMENT_OTHER): Payer: Medicare Other | Admitting: Oncology

## 2015-01-18 ENCOUNTER — Telehealth: Payer: Self-pay | Admitting: Oncology

## 2015-01-18 VITALS — BP 193/73 | HR 68 | Temp 98.0°F | Resp 18 | Ht 67.5 in | Wt 198.9 lb

## 2015-01-18 DIAGNOSIS — Z8589 Personal history of malignant neoplasm of other organs and systems: Secondary | ICD-10-CM | POA: Diagnosis not present

## 2015-01-18 DIAGNOSIS — Z171 Estrogen receptor negative status [ER-]: Secondary | ICD-10-CM

## 2015-01-18 DIAGNOSIS — I482 Chronic atrial fibrillation: Secondary | ICD-10-CM | POA: Diagnosis not present

## 2015-01-18 DIAGNOSIS — C50411 Malignant neoplasm of upper-outer quadrant of right female breast: Secondary | ICD-10-CM | POA: Diagnosis not present

## 2015-01-18 DIAGNOSIS — I350 Nonrheumatic aortic (valve) stenosis: Secondary | ICD-10-CM | POA: Diagnosis not present

## 2015-01-18 DIAGNOSIS — M2550 Pain in unspecified joint: Secondary | ICD-10-CM | POA: Diagnosis not present

## 2015-01-18 DIAGNOSIS — I10 Essential (primary) hypertension: Secondary | ICD-10-CM | POA: Diagnosis not present

## 2015-01-18 DIAGNOSIS — M545 Low back pain: Secondary | ICD-10-CM | POA: Diagnosis not present

## 2015-01-18 DIAGNOSIS — N189 Chronic kidney disease, unspecified: Secondary | ICD-10-CM | POA: Diagnosis not present

## 2015-01-18 DIAGNOSIS — M1990 Unspecified osteoarthritis, unspecified site: Secondary | ICD-10-CM | POA: Diagnosis not present

## 2015-01-18 DIAGNOSIS — E785 Hyperlipidemia, unspecified: Secondary | ICD-10-CM | POA: Diagnosis not present

## 2015-01-18 DIAGNOSIS — E669 Obesity, unspecified: Secondary | ICD-10-CM | POA: Diagnosis not present

## 2015-01-18 DIAGNOSIS — I2510 Atherosclerotic heart disease of native coronary artery without angina pectoris: Secondary | ICD-10-CM | POA: Diagnosis not present

## 2015-01-18 DIAGNOSIS — I340 Nonrheumatic mitral (valve) insufficiency: Secondary | ICD-10-CM | POA: Diagnosis not present

## 2015-01-18 DIAGNOSIS — E119 Type 2 diabetes mellitus without complications: Secondary | ICD-10-CM

## 2015-01-18 DIAGNOSIS — I639 Cerebral infarction, unspecified: Secondary | ICD-10-CM | POA: Diagnosis not present

## 2015-01-18 DIAGNOSIS — C50111 Malignant neoplasm of central portion of right female breast: Secondary | ICD-10-CM

## 2015-01-18 NOTE — Telephone Encounter (Signed)
Sent message to schedule Survivorship 1year with labs week prior.

## 2015-01-18 NOTE — Progress Notes (Signed)
ID: Diane [name]   DOB: 11/10/1940  MR#: [id number]  [id number]  PCP: Mathews Argyle, MD GYN:  SUFanny Skates OTHER MD: [name], Nancy Marus, [name] Mardee Postin  CHIEF COMPLAINT:  Right Breast Cancer  CURRENT TREATMENT: observation  BREAST CANCER HISTORY: From the original intake note: Diane had routine screening mammography at [facility] hospital 08/26/2012 showing a possible distortion in the right breast. Additional views of the breast Center 08/29/2012 confirmed a spiculated mass in the right breast measuring approximately 9 mm. This was not palpable. Ultrasound confirmed an irregular mass in the area in question, again measuring 9 mm. The right axilla was benign.  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   The patient's subsequent history is as detailed below.  INTERVAL HISTORY: Diane returns today for followup of her right breast carcinoma. Since her last visit here she presented to the emergency room with persistent chest pain April 2016. She had a cardiac cath 09/15/2014 which showed a good systolic function, 4+ mitral regurgitation, and a 40-50% LAD ostial stenosis. There was a 40-50% proximal RCA stenosis. More recently, in July, she again presented to the emergency room with chest pain and was found to be in atrial fibrillation. She is now anticoagulated,. Note that she also has a history of bradycardia. She had been on byastolic previously but that was discontinued for that reason.-- Also earlier this month the patient was discharged from follow-up by [facility], now more than 5 years out from her vulvar surgery.  REVIEW OF SYSTEMS: Diane currently denies chest pain or pressure. She sleeps poorly. She hears her heart beat in her years when she lies down. She is concerned about her gums. Sometimes her ankles swell. She denies a cough or phlegm  production. She does feel that her current medications neck her breathing "heavy air". She has heartburn. She has back and joint pain which is not more intense or persistent than before. She has occasional headaches, which she treated successfully with Claritin. Her diabetes is moderately controlled. A detailed review of systems today was otherwise stable  PAST MEDICAL HISTORY: Past Medical History  Diagnosis Date  . Lower back pain   . Herniated disc   . Arthritis   . Lumbar stenosis     L4-5  . Spondylolysis   . Coronary artery disease   . Hypertension   . Hyperlipemia   . 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 Breast cancer   . Depression   . Dysrhythmia     afib  . Shortness of breath     exertion  . Anxiety   . Diabetes mellitus without complication     diet  . Headache(784.0)   . Status post chemotherapy 11/04/2012 - 01/06/2013.    Docetaxel/Cytoxan Q 3 Weeks x 4 cycles  . S/P radiation therapy 01/23/2013-03/06/2013    Right breast / 46 Gy in 23 fractions / Right breast boost / 14 Gy in 7 fractions  . Atrial fibrillation   . Iron deficiency anemia   . Fibromyalgia   . Torn rotator cuff     PAST SURGICAL HISTORY: Past Surgical History  Procedure Laterality Date  . Wide local excision  01/2010    Bilateral groin excisions, re-excision of vulva  . Posterior lumbar arthrodesis, pedicle screw fixation, posterolateral arthodesis  03/17/11  . Tonsillectomy    . Abdominal hysterectomy    .  Dilation and curettage of uterus    . Tubal ligation    . Colonoscopy    . Shoulder arthroscopy with rotator cuff repair and subacromial decompression  06/04/2012    Procedure: SHOULDER ARTHROSCOPY WITH ROTATOR CUFF REPAIR AND SUBACROMIAL DECOMPRESSION;  Surgeon: Nita Sells, MD;  Location: [place];  Service: Orthopedics;  Laterality: Left;  LEFT SHOULDER ARTHROSCOPY WITH ROTATOR CUFF REPAIR AND SUBACROMIAL DECOMPRESSION AND DISTAL CLAVICLE RESECTION   . Partial mastectomy with needle localization and axillary sentinel lymph node bx Right 10/08/2012    Procedure: RIGHT PARTIAL MASTECTOMY WITH NEEDLE LOCALIZATION AND RIGHT AXILLARY SENTINEL LYMPH NODE BX;  Surgeon: Adin Hector, MD;  Location: [place];  Service: General;  Laterality: Right;  Injection of 1% Methylene Blue dye into right breast  . Portacath placement Left 10/08/2012    Procedure: INSERTION PORT-A-CATH WITH ULTRASOUND GUIDANCE;  Surgeon: Adin Hector, MD;  Location: [place];  Service: General;  Laterality: Left;  Using 39F [place]  . Vulvectomy partial    . Left heart catheterization with coronary angiogram N/A 09/14/2014    Procedure: LEFT HEART CATHETERIZATION WITH CORONARY ANGIOGRAM;  Surgeon: [name], MD;  Location: [facility] CATH LAB;  Service: Cardiovascular;  Laterality: N/A;    FAMILY HISTORY Family History  Problem Relation Age of Onset  . Stroke Mother   . Alzheimer's disease Mother   . Heart attack Father   . Heart attack Brother    the patient's father died from a heart attack at the age of 79. The patient's mother died at age 13 in the setting of Alzheimer's disease. Diane had 4 brothers, 3 sisters. There is no history of breast or ovarian cancer in the family to her knowledge.  GYNECOLOGIC HISTORY: Menarche age 44, first live birth age 64, she is [place] P3. She had a total abdominal hysterectomy with bilateral salpingo-oophorectomy in 1988. She took estrogen for approximately 30 years, stopping in January 2013.  SOCIAL HISTORY: (updated 03/05/2013) [name] used to work for [employer]. When she retired she moved to [facility], which is for her husband was from. He died approximately 11 years ago. The patient currently lives alone. Her daughter [name] lives with the patient and "hovers over me". Nadine works at [employer] on [employer]. The other 2 children are daughter [name], who lives in [place] and  works in a hospital, and [name], who lives in [place] and is a housewife. The patient has 5 grandchildren and 4 great-grandchildren. She is a Furniture conservator/restorer.  ADVANCED DIRECTIVES: Not in place  HEALTH MAINTENANCE: (updated 03/05/2013) Social History  Substance Use Topics  . Smoking status: Former Smoker -- 50 years    Types: Cigarettes    Quit date: 05/29/2009  . Smokeless tobacco: Never Used  . Alcohol Use: No     Colonoscopy:  PAP: s/p TAH/BSO in 1988  Bone density: June 2011 at [facility] hospital; normal  Lipid panel: Dr. Volanda Napoleon   Allergies  Allergen Reactions  . Shellfish Allergy Swelling    Current Outpatient Prescriptions  Medication Sig Dispense Refill  . amiodarone (PACERONE) 200 MG tablet Take 200 mg by mouth daily.     . dabigatran (PRADAXA) 75 MG CAPS Take 75 mg by mouth every 12 (twelve) hours.    . ferrous sulfate 325 (65 FE) MG tablet Take 325 mg by mouth daily with breakfast.    . FLUoxetine (PROZAC) 10 MG capsule Take 10 mg  by mouth daily.    [name] Kitchen gabapentin (NEURONTIN) 100 MG capsule 1 tab by mouth in morning, 1 tab by mouth mid-day, and 3 tabs by mouth at bedtime 150 capsule 3  . HYDROcodone-acetaminophen (NORCO/VICODIN) 5-325 MG per tablet Take 1-2 tablets by mouth every 4 (four) hours as needed for pain. 40 tablet 1  . losartan (COZAAR) 100 MG tablet Take 1 tablet (100 mg total) by mouth daily. 30 tablet 3  . metFORMIN (GLUCOPHAGE-XR) 500 MG 24 hr tablet Take 1 tablet (500 mg total) by mouth daily with breakfast. Total dose of 1000mg  daily 30 tablet 3  . NITROSTAT 0.4 MG SL tablet Place 0.4 mg under the tongue every 5 (five) minutes as needed for chest pain.     . potassium chloride (K-DUR) 10 MEQ tablet Take 10 mEq by mouth daily.     . pravastatin (PRAVACHOL) 40 MG tablet Take 40 mg by mouth every evening.    . traZODone (DESYREL) 50 MG tablet Take 50 mg by mouth at bedtime.    . triamterene-hydrochlorothiazide (DYAZIDE) 37.5-25 MG per capsule  Take 1 capsule by mouth daily.      No current facility-administered medications for this visit.    OBJECTIVE: Middle-aged [place] American woman who appears stated age 43 Vitals:   01/18/15 1100  BP: 193/73  Pulse: 68  Temp: 98 F (36.7 C)  Resp: 18     Body mass index is 30.67 kg/(m^2).    ECOG FS: 1 Filed Weights   01/18/15 1100  Weight: 198 lb 14.4 oz (90.22 kg)   Sclerae unicteric, EOMs intact Oropharynx clear and moist No cervical or supraclavicular adenopathy Lungs no rales or rhonchi Heart regular rate and rhythm, 3/6 systolic murmur  Abd soft, nontender, positive bowel sounds MSK no focal spinal tenderness, no upper extremity lymphedema Neuro: nonfocal, well oriented, appropriate affect Breasts: The right breast is status post lumpectomy and radiation. There is no evidence of local recurrence. The right axilla is benign. The left breast is unremarkable  LAB RESULTS: Lab Results  Component Value Date   WBC 4.9 01/11/2015   NEUTROABS 2.9 01/11/2015   HGB 10.6* 01/11/2015   HCT 33.1* 01/11/2015   MCV 84.1 01/11/2015   PLT 207 01/11/2015      Chemistry      Component Value Date/Time   NA 138 01/11/2015 1054   NA 138 12/07/2014 1832   K 3.4* 01/11/2015 1054   K 3.2* 12/07/2014 1832   CL 106 12/07/2014 1832   CL 105 11/04/2012 0937   CO2 23 01/11/2015 1054   CO2 22 12/07/2014 1832   BUN 18.4 01/11/2015 1054   BUN 16 12/07/2014 1832   CREATININE 1.4* 01/11/2015 1054   CREATININE 1.22* 12/07/2014 1832      Component Value Date/Time   CALCIUM 8.8 01/11/2015 1054   CALCIUM 8.8* 12/07/2014 1832   ALKPHOS 75 01/11/2015 1054   ALKPHOS 65 10/03/2012 1441   AST 12 01/11/2015 1054   AST 22 10/03/2012 1441   ALT 10 01/11/2015 1054   ALT 9 10/03/2012 1441   BILITOT 0.57 01/11/2015 1054   BILITOT 0.2* 10/03/2012 1441       STUDIES: CLINICAL DATA: History of right lumpectomy for breast cancer 2014. Asymptomatic.  EXAM: DIGITAL DIAGNOSTIC bilateral  MAMMOGRAM WITH 3D TOMOSYNTHESIS AND CAD  COMPARISON: [facility]  ACR Breast Density Category b: There are scattered areas of fibroglandular density.  FINDINGS: Right lumpectomy changes are identified. No abnormality is identified in either breast to suggest  malignancy.  Mammographic images were processed with CAD.  IMPRESSION: No evidence for malignancy in either breast.  RECOMMENDATION: Diagnostic mammogram is suggested in 1 year. (Code:DM-B-01Y)  I have discussed the findings and recommendations with the patient. Results were also provided in writing at the conclusion of the visit. If applicable, a reminder letter will be sent to the patient regarding the next appointment.  BI-RADS CATEGORY 2: Benign.   Electronically Signed  By: Conchita Paris M.D.  On: 08/31/2014 11:43   ASSESSMENT: 74 y.o. [place] woman   (1) s/p Rigth central breast biopsy 08/30/2012 for a clinical T1b N0, stage IA invasive ductal carcinoma, grade 3, triple negative, with an MIB-1 of 70%. The largest extent of tumor on the initial biopsy was 0.4 cm  (2)  S/p right lumpectomy under the care of Dr. Dalbert Batman on 10/08/2012 for a pT1a, pN0, stage IA  invasive ductal carcinoma, grade 3 with largest residual invasive focus measuring 0.1 cm in greatest dimension. There was no lymphovascular invasion, with 0 of one lymph node involved. Margins were clear.  (3)  status post adjuvant chemotherapy consisting of 4 cycles of docetaxel/Cytoxan, first dose on 11/04/2012, and last dose on 01/06/2013.    (4)  radiation therapy, completed October 2014  (5) remote history of Stage IB vulvar cancer, followed by Dr. Rogelio Seen  PLAN: From a breast cancer point of view Diane is doing fine and her prognosis is excellent. She is 2 years out from her definitive surgery with no evidence of disease recurrence. We are going to start seeing her on a yearly basis at this time.--   Her blood pressure is not  well-controlled despite her being on 2 blood pressure medications. She is also not taking her potassium. I suggested she should take potassium and also consider eating to fruit servings a day. I did not feel comfortable adding a third blood pressure medication in her case given her history of bradycardia. I strongly suggested she dropped by her cardiologist's office today on the way home and she has agreed to do that.  She will see our breast survivorship nurse practitioner in one year and then again to years from now. She will see me again in 3 years, which will be her 5 year anniversary from surgery, and at that point she will "graduate" from follow-up here. MAGRINAT,GUSTAV C    01/18/2015

## 2015-01-25 NOTE — Telephone Encounter (Signed)
11/11/2013 04:09 PM Phone (Incoming) Dr. Carlyle Lipa office (Provider)  In response my to earlier inquiry. Dr. Felipa Eth will increase Metformin ER to 1000mg  per day, 500mg  BID. They will contact patient.   By Bernie Covey, RN

## 2015-02-10 DIAGNOSIS — N189 Chronic kidney disease, unspecified: Secondary | ICD-10-CM | POA: Diagnosis not present

## 2015-02-10 DIAGNOSIS — I10 Essential (primary) hypertension: Secondary | ICD-10-CM | POA: Diagnosis not present

## 2015-02-10 DIAGNOSIS — I2510 Atherosclerotic heart disease of native coronary artery without angina pectoris: Secondary | ICD-10-CM | POA: Diagnosis not present

## 2015-02-10 DIAGNOSIS — I639 Cerebral infarction, unspecified: Secondary | ICD-10-CM | POA: Diagnosis not present

## 2015-02-10 DIAGNOSIS — I340 Nonrheumatic mitral (valve) insufficiency: Secondary | ICD-10-CM | POA: Diagnosis not present

## 2015-02-10 DIAGNOSIS — I482 Chronic atrial fibrillation: Secondary | ICD-10-CM | POA: Diagnosis not present

## 2015-02-10 DIAGNOSIS — E669 Obesity, unspecified: Secondary | ICD-10-CM | POA: Diagnosis not present

## 2015-02-10 DIAGNOSIS — E119 Type 2 diabetes mellitus without complications: Secondary | ICD-10-CM | POA: Diagnosis not present

## 2015-02-19 DIAGNOSIS — I10 Essential (primary) hypertension: Secondary | ICD-10-CM | POA: Diagnosis not present

## 2015-02-19 DIAGNOSIS — I482 Chronic atrial fibrillation: Secondary | ICD-10-CM | POA: Diagnosis not present

## 2015-02-19 DIAGNOSIS — E780 Pure hypercholesterolemia: Secondary | ICD-10-CM | POA: Diagnosis not present

## 2015-02-26 DIAGNOSIS — E119 Type 2 diabetes mellitus without complications: Secondary | ICD-10-CM | POA: Diagnosis not present

## 2015-02-26 DIAGNOSIS — I340 Nonrheumatic mitral (valve) insufficiency: Secondary | ICD-10-CM | POA: Diagnosis not present

## 2015-02-26 DIAGNOSIS — I10 Essential (primary) hypertension: Secondary | ICD-10-CM | POA: Diagnosis not present

## 2015-02-26 DIAGNOSIS — I482 Chronic atrial fibrillation: Secondary | ICD-10-CM | POA: Diagnosis not present

## 2015-02-26 DIAGNOSIS — I639 Cerebral infarction, unspecified: Secondary | ICD-10-CM | POA: Diagnosis not present

## 2015-02-26 DIAGNOSIS — E669 Obesity, unspecified: Secondary | ICD-10-CM | POA: Diagnosis not present

## 2015-03-02 ENCOUNTER — Encounter: Payer: Self-pay | Admitting: Podiatry

## 2015-03-02 ENCOUNTER — Ambulatory Visit (INDEPENDENT_AMBULATORY_CARE_PROVIDER_SITE_OTHER): Payer: Medicare Other | Admitting: Podiatry

## 2015-03-02 DIAGNOSIS — M79673 Pain in unspecified foot: Secondary | ICD-10-CM | POA: Diagnosis not present

## 2015-03-02 DIAGNOSIS — M2012 Hallux valgus (acquired), left foot: Secondary | ICD-10-CM | POA: Diagnosis not present

## 2015-03-02 DIAGNOSIS — M2011 Hallux valgus (acquired), right foot: Secondary | ICD-10-CM

## 2015-03-02 DIAGNOSIS — E1140 Type 2 diabetes mellitus with diabetic neuropathy, unspecified: Secondary | ICD-10-CM | POA: Diagnosis not present

## 2015-03-02 DIAGNOSIS — B351 Tinea unguium: Secondary | ICD-10-CM

## 2015-03-02 NOTE — Progress Notes (Signed)
Patient ID: Diane [name], female   DOB: 03/11/1941, 74 y.o.   MRN: [id number] Patient presents for diabetic shoe pick up, shoes are tried on for good fit.  Patient received 1 Pair Orthofeet [place] in women's 10 extra wide and 3 pairs custom molded diabetic inserts.  Verbal and written break in and wear instructions given.  Patient will follow up for scheduled routine care.   Complaint:  Visit Type: Patient returns to my office for continued preventative foot care services. Complaint: Patient states" my nails have grown long and thick and become painful to walk and wear shoes" Patient has been diagnosed with DM with neuropathy.. The patient presents for preventative foot care services. No changes to ROS  Podiatric Exam: Vascular: dorsalis pedis and posterior tibial pulses are palpable bilateral. Capillary return is immediate. Temperature gradient is WNL. Skin turgor WNL  Sensorium: Diminished  Semmes Weinstein monofilament test. Normal tactile sensation bilaterally. Nail Exam: Pt has thick disfigured discolored nails with subungual debris noted bilateral entire nail hallux through fifth toenails Ulcer Exam: There is no evidence of ulcer or pre-ulcerative changes or infection. Orthopedic Exam: Muscle tone and strength are WNL. No limitations in general ROM. No crepitus or effusions noted. Foot type and digits show no abnormalities. Bony prominences are unremarkable. Skin: No Porokeratosis. No infection or ulcers  Diagnosis:  Onychomycosis, , Pain in right toe, pain in left toes  Treatment & Plan Procedures and Treatment: Consent by patient was obtained for treatment procedures. The patient understood the discussion of treatment and procedures well. All questions were answered thoroughly reviewed. Debridement of mycotic and hypertrophic toenails, 1 through 5 bilateral and clearing of subungual debris. No ulceration, no infection noted. Patient presents today and was dispensed 0ne  pair ( two units) of medically necessary extra depth shoes with three pair( six units) of custom molded multiple density inserts. The shoes and the inserts are fitted to the patients ' feet and are noted to fit well and are free of defect.  Length and width of the shoes are also acceptable.  Patient was given written and verbal  instructions for wearing.  If any concerns arrive with the shoes or inserts, the patient is to call the office.Patient is to follow up with doctor in six weeks. Return Visit-Office Procedure: Patient instructed to return to the office for a follow up visit 3 months for continued evaluation and treatment.

## 2015-03-02 NOTE — Patient Instructions (Signed)

## 2015-03-08 DIAGNOSIS — Z961 Presence of intraocular lens: Secondary | ICD-10-CM | POA: Diagnosis not present

## 2015-03-19 DIAGNOSIS — Z23 Encounter for immunization: Secondary | ICD-10-CM | POA: Diagnosis not present

## 2015-04-12 DIAGNOSIS — R51 Headache: Secondary | ICD-10-CM | POA: Diagnosis not present

## 2015-04-12 DIAGNOSIS — I10 Essential (primary) hypertension: Secondary | ICD-10-CM | POA: Diagnosis not present

## 2015-04-12 DIAGNOSIS — R079 Chest pain, unspecified: Secondary | ICD-10-CM | POA: Diagnosis not present

## 2015-04-20 DIAGNOSIS — Z7984 Long term (current) use of oral hypoglycemic drugs: Secondary | ICD-10-CM | POA: Diagnosis not present

## 2015-04-20 DIAGNOSIS — I10 Essential (primary) hypertension: Secondary | ICD-10-CM | POA: Diagnosis not present

## 2015-04-20 DIAGNOSIS — E119 Type 2 diabetes mellitus without complications: Secondary | ICD-10-CM | POA: Diagnosis not present

## 2015-04-20 DIAGNOSIS — Z79899 Other long term (current) drug therapy: Secondary | ICD-10-CM | POA: Diagnosis not present

## 2015-04-20 DIAGNOSIS — Z1389 Encounter for screening for other disorder: Secondary | ICD-10-CM | POA: Diagnosis not present

## 2015-04-20 DIAGNOSIS — Z0000 Encounter for general adult medical examination without abnormal findings: Secondary | ICD-10-CM | POA: Diagnosis not present

## 2015-04-20 DIAGNOSIS — I481 Persistent atrial fibrillation: Secondary | ICD-10-CM | POA: Diagnosis not present

## 2015-05-11 ENCOUNTER — Encounter (HOSPITAL_COMMUNITY): Payer: Self-pay | Admitting: Emergency Medicine

## 2015-05-11 ENCOUNTER — Emergency Department (INDEPENDENT_AMBULATORY_CARE_PROVIDER_SITE_OTHER)
Admission: EM | Admit: 2015-05-11 | Discharge: 2015-05-11 | Disposition: A | Discharge status: Home / Self Care | Payer: Medicare Other | Source: Home / Self Care | Attending: Emergency Medicine | Admitting: Emergency Medicine

## 2015-05-11 DIAGNOSIS — J3489 Other specified disorders of nose and nasal sinuses: Secondary | ICD-10-CM | POA: Diagnosis not present

## 2015-05-11 DIAGNOSIS — J9801 Acute bronchospasm: Secondary | ICD-10-CM

## 2015-05-11 DIAGNOSIS — J069 Acute upper respiratory infection, unspecified: Secondary | ICD-10-CM

## 2015-05-11 MED ORDER — IPRATROPIUM-ALBUTEROL 0.5-2.5 (3) MG/3ML IN SOLN
RESPIRATORY_TRACT | Status: AC
  Filled 2015-05-11: qty 3

## 2015-05-11 MED ORDER — IPRATROPIUM-ALBUTEROL 0.5-2.5 (3) MG/3ML IN SOLN
3.0000 mL | Freq: Once | RESPIRATORY_TRACT | Status: AC
  Administered 2015-05-11: 3 mL via RESPIRATORY_TRACT

## 2015-05-11 MED ORDER — PREDNISONE 20 MG PO TABS: ORAL_TABLET | ORAL | Status: DC

## 2015-05-11 MED ORDER — ALBUTEROL SULFATE HFA 108 (90 BASE) MCG/ACT IN AERS: 2.0000 | INHALATION_SPRAY | RESPIRATORY_TRACT | Status: DC | PRN

## 2015-05-11 NOTE — ED Provider Notes (Signed)
CSN: [id number]     Arrival date & time 05/11/15  1334 History   First MD Initiated Contact with Patient 05/11/15 1426     Chief Complaint  Patient presents with  . URI   (Consider location/radiation/quality/duration/timing/severity/associated sxs/prior Treatment) HPI Comments: 74 year old female complaining of a cough, PND and productive "phlegm". She also has a mild left earache. Denies fever or sore throat. The symptoms began approximately 1-1/2 weeks ago.   Patient is a 74 y.o. female presenting with URI.  URI Presenting symptoms: congestion, cough, ear pain and rhinorrhea   Presenting symptoms: no fatigue, no fever and no sore throat   Associated symptoms: no neck pain     Past Medical History  Diagnosis Date  . Lower back pain   . Herniated disc   . Arthritis   . Lumbar stenosis     L4-5  . Spondylolysis   . Coronary artery disease   . Hypertension   . Hyperlipemia   . 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 Breast cancer ([place])   . Depression   . Dysrhythmia     afib  . Shortness of breath     exertion  . Anxiety   . Diabetes mellitus without complication (HCC)     diet  . Headache(784.0)   . Status post chemotherapy 11/04/2012 - 01/06/2013.    Docetaxel/Cytoxan Q 3 Weeks x 4 cycles  . S/P radiation therapy 01/23/2013-03/06/2013    Right breast / 46 Gy in 23 fractions / Right breast boost / 14 Gy in 7 fractions  . Atrial fibrillation ([place])   . Iron deficiency anemia   . Fibromyalgia   . Torn rotator cuff    Past Surgical History  Procedure Laterality Date  . Wide local excision  01/2010    Bilateral groin excisions, re-excision of vulva  . Posterior lumbar arthrodesis, pedicle screw fixation, posterolateral arthodesis  03/17/11  . Tonsillectomy    . Abdominal hysterectomy    . Dilation and curettage of uterus    . Tubal ligation    . Colonoscopy    . Shoulder arthroscopy with rotator cuff repair and subacromial decompression  06/04/2012     Procedure: SHOULDER ARTHROSCOPY WITH ROTATOR CUFF REPAIR AND SUBACROMIAL DECOMPRESSION;  Surgeon: Nita Sells, MD;  Location: [place];  Service: Orthopedics;  Laterality: Left;  LEFT SHOULDER ARTHROSCOPY WITH ROTATOR CUFF REPAIR AND SUBACROMIAL DECOMPRESSION AND DISTAL CLAVICLE RESECTION  . Partial mastectomy with needle localization and axillary sentinel lymph node bx Right 10/08/2012    Procedure: RIGHT PARTIAL MASTECTOMY WITH NEEDLE LOCALIZATION AND RIGHT AXILLARY SENTINEL LYMPH NODE BX;  Surgeon: Adin Hector, MD;  Location: [place];  Service: General;  Laterality: Right;  Injection of 1% Methylene Blue dye into right breast  . Portacath placement Left 10/08/2012    Procedure: INSERTION PORT-A-CATH WITH ULTRASOUND GUIDANCE;  Surgeon: Adin Hector, MD;  Location: [place];  Service: General;  Laterality: Left;  Using 18F [place]  . Vulvectomy partial    . Left heart catheterization with coronary angiogram N/A 09/14/2014    Procedure: LEFT HEART CATHETERIZATION WITH CORONARY ANGIOGRAM;  Surgeon: [name], MD;  Location: [facility];  Service: Cardiovascular;  Laterality: N/A;   Family History  Problem Relation Age of Onset  . Stroke Mother   . Alzheimer's disease Mother   . Heart attack Father   . Heart attack Brother    Social History  Substance Use Topics  .  Smoking status: Former Smoker -- 50 years    Types: Cigarettes    Quit date: 05/29/2009  . Smokeless tobacco: Never Used  . Alcohol Use: No   OB History    No data available     Review of Systems  Constitutional: Positive for activity change. Negative for fever, chills, appetite change and fatigue.  HENT: Positive for congestion, ear pain, postnasal drip and rhinorrhea. Negative for facial swelling and sore throat.   Eyes: Negative.   Respiratory: Positive for cough. Negative for chest tightness and shortness of breath.   Cardiovascular: Negative.   Gastrointestinal: Negative.    Genitourinary: Negative.   Musculoskeletal: Negative for neck pain and neck stiffness.  Skin: Negative for pallor and rash.  Neurological: Negative.     Allergies  Shellfish allergy  Home Medications   Prior to Admission medications   Medication Sig Start Date End Date Taking? Authorizing Provider  amiodarone (PACERONE) 200 MG tablet Take 200 mg by mouth daily.  12/09/14  Yes Historical Provider, MD  dabigatran (PRADAXA) 75 MG CAPS Take 75 mg by mouth every 12 (twelve) hours.   Yes Historical Provider, MD  ferrous sulfate 325 (65 FE) MG tablet Take 325 mg by mouth daily with breakfast.   Yes Historical Provider, MD  FLUoxetine (PROZAC) 10 MG capsule Take 10 mg by mouth daily.   Yes Historical Provider, MD  gabapentin (NEURONTIN) 100 MG capsule 1 tab by mouth in morning, 1 tab by mouth mid-day, and 3 tabs by mouth at bedtime 03/05/13  Yes Amy G Berry, PA-C  losartan (COZAAR) 100 MG tablet Take 1 tablet (100 mg total) by mouth daily. 09/15/14  Yes [name], MD  metFORMIN (GLUCOPHAGE-XR) 500 MG 24 hr tablet Take 1 tablet (500 mg total) by mouth daily with breakfast. Total dose of 1000mg  daily 09/16/14  Yes [name], MD  potassium chloride (K-DUR) 10 MEQ tablet Take 10 mEq by mouth daily.  09/04/14  Yes Historical Provider, MD  pravastatin (PRAVACHOL) 40 MG tablet Take 40 mg by mouth every evening.   Yes Historical Provider, MD  traZODone (DESYREL) 50 MG tablet Take 50 mg by mouth at bedtime.   Yes Historical Provider, MD  triamterene-hydrochlorothiazide (DYAZIDE) 37.5-25 MG per capsule Take 1 capsule by mouth daily.  08/10/14  Yes Historical Provider, MD  albuterol (PROVENTIL HFA;VENTOLIN HFA) 108 (90 BASE) MCG/ACT inhaler Inhale 2 puffs into the lungs every 4 (four) hours as needed for wheezing or shortness of breath. 05/11/15   Janne Napoleon, NP  HYDROcodone-acetaminophen (NORCO/VICODIN) 5-325 MG per tablet Take 1-2 tablets by mouth every 4 (four) hours as needed for pain. 10/08/12   [name], MD  NITROSTAT 0.4 MG SL tablet Place 0.4 mg under the tongue every 5 (five) minutes as needed for chest pain.  10/14/12   Historical Provider, MD  predniSONE (DELTASONE) 20 MG tablet Take 3 tabs po on first day, 2 tabs second day, 2 tabs third day, 1 tab fourth day, 1 tab 5th day. Take with food. 05/11/15   Janne Napoleon, NP   Meds Ordered and Administered this Visit   Medications  ipratropium-albuterol (DUONEB) 0.5-2.5 (3) MG/3ML nebulizer solution 3 mL (3 mLs Nebulization Given 05/11/15 1524)    BP 133/82 mmHg  Pulse 68  Temp(Src) 98.1 F (36.7 C) (Oral)  Resp 20  SpO2 95% No data found.   Physical Exam  Constitutional: She is oriented to person, place, and time. She appears well-developed and well-nourished. No distress.  HENT:  Mouth/Throat: No oropharyngeal exudate.  Bilateral TMs with minor retraction but no erythema or bulging. Oropharynx with erythema, cobblestoning and clear PND.  Eyes: Conjunctivae and EOM are normal.  Neck: Normal range of motion. Neck supple.  Cardiovascular: Normal rate and regular rhythm.   3/6 systolic ejection murmur  Pulmonary/Chest: Effort normal and breath sounds normal. No respiratory distress.  Faint wheezes bilaterally with inspiration and expiration. Forced cough produces bilateral coarseness.  Musculoskeletal: Normal range of motion. She exhibits no edema.  Lymphadenopathy:    She has no cervical adenopathy.  Neurological: She is alert and oriented to person, place, and time.  Skin: Skin is warm and dry. No rash noted.  Psychiatric: She has a normal mood and affect.  Nursing note and vitals reviewed.   ED Course  Procedures (including critical care time)  Labs Review Labs Reviewed - No data to display  Imaging Review No results found.   Visual Acuity Review  Right Eye Distance:   Left Eye Distance:   Bilateral Distance:    Right Eye Near:   Left Eye Near:    Bilateral Near:         MDM   1. URI (upper  respiratory infection)   2. Cough due to bronchospasm   3. Sinus drainage    Post DuoNeb 2.5 mg the patient's lungs are much clearer. Air is moving in and out without wheezing or other adventitious sounds. Albuterol HFA as directed Low-dose prednisone, short taper dose as directed take with food For drainage take either Zyrtec 10 mg, Claritin 10 mg or Allegra 60 mg twice a day. Drink plenty of fluids and stay well-hydrated Use saline nasal spray frequently.     Janne Napoleon, NP 05/11/15 [phone]

## 2015-05-11 NOTE — Discharge Instructions (Signed)
Bronchospasm, Adult A bronchospasm is a spasm or tightening of the airways going into the lungs. During a bronchospasm breathing becomes more difficult because the airways get smaller. When this happens there can be coughing, a whistling sound when breathing (wheezing), and difficulty breathing. Bronchospasm is often associated with asthma, but not all patients who experience a bronchospasm have asthma. CAUSES  A bronchospasm is caused by inflammation or irritation of the airways. The inflammation or irritation may be triggered by:   Allergies (such as to animals, pollen, food, or mold). Allergens that cause bronchospasm may cause wheezing immediately after exposure or many hours later.   Infection. Viral infections are believed to be the most common cause of bronchospasm.   Exercise.   Irritants (such as pollution, cigarette smoke, strong odors, aerosol sprays, and paint fumes).   Weather changes. Winds increase molds and pollens in the air. Rain refreshes the air by washing irritants out. Cold air may cause inflammation.   Stress and emotional upset.  SIGNS AND SYMPTOMS   Wheezing.   Excessive nighttime coughing.   Frequent or severe coughing with a simple cold.   Chest tightness.   Shortness of breath.  DIAGNOSIS  Bronchospasm is usually diagnosed through a history and physical exam. Tests, such as chest X-rays, are sometimes done to look for other conditions. TREATMENT   Inhaled medicines can be given to open up your airways and help you breathe. The medicines can be given using either an inhaler or a nebulizer machine.  Corticosteroid medicines may be given for severe bronchospasm, usually when it is associated with asthma. HOME CARE INSTRUCTIONS   Always have a plan prepared for seeking medical care. Know when to call your health care provider and local emergency services (911 in the [place] Know where you can access local emergency care.  Only take medicines as  directed by your health care provider.  If you were prescribed an inhaler or nebulizer machine, ask your health care provider to explain how to use it correctly. Always use a spacer with your inhaler if you were given one.  It is necessary to remain calm during an attack. Try to relax and breathe more slowly.  Control your home environment in the following ways:   Change your heating and air conditioning filter at least once a month.   Limit your use of fireplaces and wood stoves.  Do not smoke and do not allow smoking in your home.   Avoid exposure to perfumes and fragrances.   Get rid of pests (such as roaches and mice) and their droppings.   Throw away plants if you see mold on them.   Keep your house clean and dust free.   Replace carpet with wood, tile, or vinyl flooring. Carpet can trap dander and dust.   Use allergy-proof pillows, mattress covers, and box spring covers.   Wash bed sheets and blankets every week in hot water and dry them in a dryer.   Use blankets that are made of polyester or cotton.   Wash hands frequently. SEEK MEDICAL CARE IF:   You have muscle aches.   You have chest pain.   The sputum changes from clear or white to yellow, green, gray, or bloody.   The sputum you cough up gets thicker.   There are problems that may be related to the medicine you are given, such as a rash, itching, swelling, or trouble breathing.  SEEK IMMEDIATE MEDICAL CARE IF:   You have worsening wheezing and coughing  even after taking your prescribed medicines.   You have increased difficulty breathing.   You develop severe chest pain. MAKE SURE YOU:   Understand these instructions.  Will watch your condition.  Will get help right away if you are not doing well or get worse.   This information is not intended to replace advice given to you by your health care provider. Make sure you discuss any questions you have with your health care  provider.   Document Released: 05/18/2003 Document Revised: 06/05/2014 Document Reviewed: 11/04/2012 Elsevier Interactive Patient Education 2016 [employer].  How to Use an Inhaler Using your inhaler correctly is very important. Good technique will make sure that the medicine reaches your lungs.  HOW TO USE AN INHALER:  Take the cap off the inhaler.  If this is the first time using your inhaler, you need to prime it. Shake the inhaler for 5 seconds. Release four puffs into the air, away from your face. Ask your doctor for help if you have questions.  Shake the inhaler for 5 seconds.  Turn the inhaler so the bottle is above the mouthpiece.  Put your pointer finger on top of the bottle. Your thumb holds the bottom of the inhaler.  Open your mouth.  Either hold the inhaler away from your mouth (the width of 2 fingers) or place your lips tightly around the mouthpiece. Ask your doctor which way to use your inhaler.  Breathe out as much air as possible.  Breathe in and push down on the bottle 1 time to release the medicine. You will feel the medicine go in your mouth and throat.  Continue to take a deep breath in very slowly. Try to fill your lungs.  After you have breathed in completely, hold your breath for 10 seconds. This will help the medicine to settle in your lungs. If you cannot hold your breath for 10 seconds, hold it for as long as you can before you breathe out.  Breathe out slowly, through pursed lips. Whistling is an example of pursed lips.  If your doctor has told you to take more than 1 puff, wait at least 15-30 seconds between puffs. This will help you get the best results from your medicine. Do not use the inhaler more than your doctor tells you to.  Put the cap back on the inhaler.  Follow the directions from your doctor or from the inhaler package about cleaning the inhaler. If you use more than one inhaler, ask your doctor which inhalers to use and what order to  use them in. Ask your doctor to help you figure out when you will need to refill your inhaler.  If you use a steroid inhaler, always rinse your mouth with water after your last puff, gargle and spit out the water. Do not swallow the water. GET HELP IF:  The inhaler medicine only partially helps to stop wheezing or shortness of breath.  You are having trouble using your inhaler.  You have some increase in thick spit (phlegm). GET HELP RIGHT AWAY IF:  The inhaler medicine does not help your wheezing or shortness of breath or you have tightness in your chest.  You have dizziness, headaches, or fast heart rate.  You have chills, fever, or night sweats.  You have a large increase of thick spit, or your thick spit is bloody. MAKE SURE YOU:   Understand these instructions.  Will watch your condition.  Will get help right away if you are not  doing well or get worse.   This information is not intended to replace advice given to you by your health care provider. Make sure you discuss any questions you have with your health care provider.   Document Released: 02/22/2008 Document Revised: 03/05/2013 Document Reviewed: 12/12/2012 Elsevier Interactive Patient Education 2016 [employer].  Upper Respiratory Infection, Adult For drainage take either Zyrtec 10 mg, Claritin 10 mg or Allegra 60 mg twice a day. Drink plenty of fluids and stay well-hydrated Use saline nasal spray frequently. Most upper respiratory infections (URIs) are caused by a virus. A URI affects the nose, throat, and upper air passages. The most common type of URI is often called "the common cold." HOME CARE   Take medicines only as told by your doctor.  Gargle warm saltwater or take cough drops to comfort your throat as told by your doctor.  Use a warm mist humidifier or inhale steam from a shower to increase air moisture. This may make it easier to breathe.  Drink enough fluid to keep your pee (urine) clear or pale  yellow.  Eat soups and other clear broths.  Have a healthy diet.  Rest as needed.  Go back to work when your fever is gone or your doctor says it is okay.  You may need to stay home longer to avoid giving your URI to others.  You can also wear a face mask and wash your hands often to prevent spread of the virus.  Use your inhaler more if you have asthma.  Do not use any tobacco products, including cigarettes, chewing tobacco, or electronic cigarettes. If you need help quitting, ask your doctor. GET HELP IF:  You are getting worse, not better.  Your symptoms are not helped by medicine.  You have chills.  You are getting more short of breath.  You have brown or red mucus.  You have yellow or brown discharge from your nose.  You have pain in your face, especially when you bend forward.  You have a fever.  You have puffy (swollen) neck glands.  You have pain while swallowing.  You have white areas in the back of your throat. GET HELP RIGHT AWAY IF:   You have very bad or constant:  Headache.  Ear pain.  Pain in your forehead, behind your eyes, and over your cheekbones (sinus pain).  Chest pain.  You have long-lasting (chronic) lung disease and any of the following:  Wheezing.  Long-lasting cough.  Coughing up blood.  A change in your usual mucus.  You have a stiff neck.  You have changes in your:  Vision.  Hearing.  Thinking.  Mood. MAKE SURE YOU:   Understand these instructions.  Will watch your condition.  Will get help right away if you are not doing well or get worse.   This information is not intended to replace advice given to you by your health care provider. Make sure you discuss any questions you have with your health care provider.   Document Released: 11/01/2007 Document Revised: 09/29/2014 Document Reviewed: 08/20/2013 Elsevier Interactive Patient Education [employer].

## 2015-05-11 NOTE — ED Notes (Signed)
C/o cold sx onset 1.5 weeks Sx include prod cough, congestion, runny nose, chills Taking OTC cold meds w/no relief A&O x4... No acute distress.

## 2015-05-12 MED ORDER — ALBUTEROL SULFATE HFA 108 (90 BASE) MCG/ACT IN AERS: 2.0000 | INHALATION_SPRAY | RESPIRATORY_TRACT | Status: DC | PRN

## 2015-05-13 NOTE — ED Notes (Signed)
Pt    Presented   With   [facility]  About  [facility]     CHART  PRINTED  AND  GIVEN  TO   [facility]  LAB/  TELEPHONE  NURSE  WHO  WILL     SPEAK  WITH     PATIENT

## 2015-05-28 DIAGNOSIS — M1990 Unspecified osteoarthritis, unspecified site: Secondary | ICD-10-CM | POA: Diagnosis not present

## 2015-05-28 DIAGNOSIS — E669 Obesity, unspecified: Secondary | ICD-10-CM | POA: Diagnosis not present

## 2015-05-28 DIAGNOSIS — E119 Type 2 diabetes mellitus without complications: Secondary | ICD-10-CM | POA: Diagnosis not present

## 2015-05-28 DIAGNOSIS — E785 Hyperlipidemia, unspecified: Secondary | ICD-10-CM | POA: Diagnosis not present

## 2015-05-28 DIAGNOSIS — I639 Cerebral infarction, unspecified: Secondary | ICD-10-CM | POA: Diagnosis not present

## 2015-05-28 DIAGNOSIS — I340 Nonrheumatic mitral (valve) insufficiency: Secondary | ICD-10-CM | POA: Diagnosis not present

## 2015-05-28 DIAGNOSIS — I482 Chronic atrial fibrillation: Secondary | ICD-10-CM | POA: Diagnosis not present

## 2015-05-28 DIAGNOSIS — I350 Nonrheumatic aortic (valve) stenosis: Secondary | ICD-10-CM | POA: Diagnosis not present

## 2015-05-28 DIAGNOSIS — I10 Essential (primary) hypertension: Secondary | ICD-10-CM | POA: Diagnosis not present

## 2015-06-02 DIAGNOSIS — C519 Malignant neoplasm of vulva, unspecified: Secondary | ICD-10-CM | POA: Diagnosis not present

## 2015-06-02 DIAGNOSIS — R011 Cardiac murmur, unspecified: Secondary | ICD-10-CM | POA: Diagnosis not present

## 2015-06-02 DIAGNOSIS — I10 Essential (primary) hypertension: Secondary | ICD-10-CM | POA: Diagnosis not present

## 2015-06-02 DIAGNOSIS — I2510 Atherosclerotic heart disease of native coronary artery without angina pectoris: Secondary | ICD-10-CM | POA: Diagnosis not present

## 2015-06-02 DIAGNOSIS — C50411 Malignant neoplasm of upper-outer quadrant of right female breast: Secondary | ICD-10-CM | POA: Diagnosis not present

## 2015-06-02 DIAGNOSIS — E1165 Type 2 diabetes mellitus with hyperglycemia: Secondary | ICD-10-CM | POA: Diagnosis not present

## 2015-06-08 ENCOUNTER — Ambulatory Visit (INDEPENDENT_AMBULATORY_CARE_PROVIDER_SITE_OTHER): Payer: Medicare Other | Admitting: Podiatry

## 2015-06-08 ENCOUNTER — Encounter: Payer: Self-pay | Admitting: Podiatry

## 2015-06-08 DIAGNOSIS — E1140 Type 2 diabetes mellitus with diabetic neuropathy, unspecified: Secondary | ICD-10-CM

## 2015-06-08 DIAGNOSIS — M79673 Pain in unspecified foot: Secondary | ICD-10-CM | POA: Diagnosis not present

## 2015-06-08 DIAGNOSIS — B351 Tinea unguium: Secondary | ICD-10-CM | POA: Diagnosis not present

## 2015-06-08 NOTE — Progress Notes (Signed)
Patient ID: Diane [name], female   DOB: 1940/06/27, 75 y.o.   MRN: [id number]   Complaint:  Visit Type: Patient returns to my office for continued preventative foot care services. Complaint: Patient states" my nails have grown long and thick and become painful to walk and wear shoes" Patient has been diagnosed with DM with neuropathy.. The patient presents for preventative foot care services. No changes to ROS  Podiatric Exam: Vascular: dorsalis pedis and posterior tibial pulses are palpable bilateral. Capillary return is immediate. Temperature gradient is WNL. Skin turgor WNL  Sensorium: Diminished  Semmes Weinstein monofilament test. Normal tactile sensation bilaterally. Nail Exam: Pt has thick disfigured discolored nails with subungual debris noted bilateral entire nail hallux through fifth toenails Ulcer Exam: There is no evidence of ulcer or pre-ulcerative changes or infection. Orthopedic Exam: Muscle tone and strength are WNL. No limitations in general ROM. No crepitus or effusions noted. Foot type and digits show no abnormalities. Bony prominences are unremarkable. Skin: No Porokeratosis. No infection or ulcers  Diagnosis:  Onychomycosis, , Pain in right toe, pain in left toes  Treatment & Plan Procedures and Treatment: Consent by patient was obtained for treatment procedures. The patient understood the discussion of treatment and procedures well. All questions were answered thoroughly reviewed. Debridement of mycotic and hypertrophic toenails, 1 through 5 bilateral and clearing of subungual debris. No ulceration, no infection noted.  Return Visit-Office Procedure: Patient instructed to return to the office for a follow up visit 3 months for continued evaluation and treatment.   Gardiner Barefoot DPM

## 2015-07-08 DIAGNOSIS — M1711 Unilateral primary osteoarthritis, right knee: Secondary | ICD-10-CM | POA: Diagnosis not present

## 2015-07-08 DIAGNOSIS — M5442 Lumbago with sciatica, left side: Secondary | ICD-10-CM | POA: Diagnosis not present

## 2015-07-08 DIAGNOSIS — M545 Low back pain: Secondary | ICD-10-CM | POA: Diagnosis not present

## 2015-07-08 DIAGNOSIS — M1712 Unilateral primary osteoarthritis, left knee: Secondary | ICD-10-CM | POA: Diagnosis not present

## 2015-07-28 ENCOUNTER — Other Ambulatory Visit: Payer: Self-pay | Admitting: General Surgery

## 2015-07-28 DIAGNOSIS — Z853 Personal history of malignant neoplasm of breast: Secondary | ICD-10-CM

## 2015-07-28 DIAGNOSIS — Z9889 Other specified postprocedural states: Secondary | ICD-10-CM

## 2015-08-03 DIAGNOSIS — M1712 Unilateral primary osteoarthritis, left knee: Secondary | ICD-10-CM | POA: Diagnosis not present

## 2015-08-17 DIAGNOSIS — I481 Persistent atrial fibrillation: Secondary | ICD-10-CM | POA: Diagnosis not present

## 2015-08-17 DIAGNOSIS — I209 Angina pectoris, unspecified: Secondary | ICD-10-CM | POA: Diagnosis not present

## 2015-08-17 DIAGNOSIS — Z6831 Body mass index (BMI) 31.0-31.9, adult: Secondary | ICD-10-CM | POA: Diagnosis not present

## 2015-08-17 DIAGNOSIS — M4806 Spinal stenosis, lumbar region: Secondary | ICD-10-CM | POA: Diagnosis not present

## 2015-08-17 DIAGNOSIS — Z7984 Long term (current) use of oral hypoglycemic drugs: Secondary | ICD-10-CM | POA: Diagnosis not present

## 2015-08-17 DIAGNOSIS — C519 Malignant neoplasm of vulva, unspecified: Secondary | ICD-10-CM | POA: Diagnosis not present

## 2015-08-17 DIAGNOSIS — E669 Obesity, unspecified: Secondary | ICD-10-CM | POA: Diagnosis not present

## 2015-08-17 DIAGNOSIS — I10 Essential (primary) hypertension: Secondary | ICD-10-CM | POA: Diagnosis not present

## 2015-08-17 DIAGNOSIS — E119 Type 2 diabetes mellitus without complications: Secondary | ICD-10-CM | POA: Diagnosis not present

## 2015-08-17 DIAGNOSIS — Z79899 Other long term (current) drug therapy: Secondary | ICD-10-CM | POA: Diagnosis not present

## 2015-08-18 DIAGNOSIS — E119 Type 2 diabetes mellitus without complications: Secondary | ICD-10-CM | POA: Diagnosis not present

## 2015-08-18 DIAGNOSIS — Z7984 Long term (current) use of oral hypoglycemic drugs: Secondary | ICD-10-CM | POA: Diagnosis not present

## 2015-08-27 DIAGNOSIS — I340 Nonrheumatic mitral (valve) insufficiency: Secondary | ICD-10-CM | POA: Diagnosis not present

## 2015-08-27 DIAGNOSIS — E119 Type 2 diabetes mellitus without complications: Secondary | ICD-10-CM | POA: Diagnosis not present

## 2015-08-27 DIAGNOSIS — E669 Obesity, unspecified: Secondary | ICD-10-CM | POA: Diagnosis not present

## 2015-08-27 DIAGNOSIS — I639 Cerebral infarction, unspecified: Secondary | ICD-10-CM | POA: Diagnosis not present

## 2015-08-27 DIAGNOSIS — M1990 Unspecified osteoarthritis, unspecified site: Secondary | ICD-10-CM | POA: Diagnosis not present

## 2015-08-27 DIAGNOSIS — I10 Essential (primary) hypertension: Secondary | ICD-10-CM | POA: Diagnosis not present

## 2015-08-27 DIAGNOSIS — R0609 Other forms of dyspnea: Secondary | ICD-10-CM | POA: Diagnosis not present

## 2015-08-27 DIAGNOSIS — E785 Hyperlipidemia, unspecified: Secondary | ICD-10-CM | POA: Diagnosis not present

## 2015-08-27 DIAGNOSIS — I480 Paroxysmal atrial fibrillation: Secondary | ICD-10-CM | POA: Diagnosis not present

## 2015-09-01 ENCOUNTER — Ambulatory Visit
Admission: RE | Admit: 2015-09-01 | Discharge: 2015-09-01 | Disposition: A | Discharge status: Home / Self Care | Payer: Medicare Other | Source: Ambulatory Visit | Attending: General Surgery | Admitting: General Surgery

## 2015-09-01 DIAGNOSIS — R928 Other abnormal and inconclusive findings on diagnostic imaging of breast: Secondary | ICD-10-CM | POA: Diagnosis not present

## 2015-09-01 DIAGNOSIS — Z9889 Other specified postprocedural states: Secondary | ICD-10-CM

## 2015-09-01 DIAGNOSIS — Z853 Personal history of malignant neoplasm of breast: Secondary | ICD-10-CM

## 2015-09-07 ENCOUNTER — Encounter: Payer: Self-pay | Admitting: Podiatry

## 2015-09-07 ENCOUNTER — Ambulatory Visit (INDEPENDENT_AMBULATORY_CARE_PROVIDER_SITE_OTHER): Payer: Medicare Other | Admitting: Podiatry

## 2015-09-07 DIAGNOSIS — M2011 Hallux valgus (acquired), right foot: Secondary | ICD-10-CM

## 2015-09-07 DIAGNOSIS — M79673 Pain in unspecified foot: Secondary | ICD-10-CM

## 2015-09-07 DIAGNOSIS — B351 Tinea unguium: Secondary | ICD-10-CM | POA: Diagnosis not present

## 2015-09-07 MED ORDER — MELOXICAM 7.5 MG PO TABS: 7.5000 mg | ORAL_TABLET | Freq: Every day | ORAL | Status: DC

## 2015-09-07 NOTE — Progress Notes (Signed)
Patient ID: Diane [name], female   DOB: 22-May-1941, 75 y.o.   MRN: [id number]   Complaint:  Visit Type: Patient returns to my office for continued preventative foot care services. Complaint: Patient states" my nails have grown long and thick and become painful to walk and wear shoes" Patient has been diagnosed with DM with neuropathy.. The patient presents for preventative foot care services. No changes to ROS  Podiatric Exam: Vascular: dorsalis pedis and posterior tibial pulses are palpable bilateral. Capillary return is immediate. Temperature gradient is WNL. Skin turgor WNL  Sensorium: Diminished  Semmes Weinstein monofilament test. Normal tactile sensation bilaterally. Nail Exam: Pt has thick disfigured discolored nails with subungual debris noted bilateral entire nail hallux through fifth toenails Ulcer Exam: There is no evidence of ulcer or pre-ulcerative changes or infection. Orthopedic Exam: Muscle tone and strength are WNL. No limitations in general ROM. No crepitus or effusions noted. Foot type and digits show no abnormalities.HAV 1st MPJ B/L. Skin: No Porokeratosis. No infection or ulcers  Diagnosis:  Onychomycosis, , Pain in right toe, pain in left toes  Treatment & Plan Procedures and Treatment: Consent by patient was obtained for treatment procedures. The patient understood the discussion of treatment and procedures well. All questions were answered thoroughly reviewed. Debridement of mycotic and hypertrophic toenails, 1 through 5 bilateral and clearing of subungual debris. No ulceration, no infection noted. Prescribe Mobic to help bunion right foot pain.Return Visit-Office Procedure: Patient instructed to return to the office for a follow up visit 3 months for continued evaluation and treatment.   Gardiner Barefoot DPM

## 2015-09-07 NOTE — Addendum Note (Signed)
Addended by: Ezzard Flax, AMMIE L on: 09/07/2015 04:04 PM   Modules accepted: Orders

## 2015-09-09 DIAGNOSIS — R0609 Other forms of dyspnea: Secondary | ICD-10-CM | POA: Diagnosis not present

## 2015-09-09 DIAGNOSIS — I10 Essential (primary) hypertension: Secondary | ICD-10-CM | POA: Diagnosis not present

## 2015-09-09 DIAGNOSIS — I480 Paroxysmal atrial fibrillation: Secondary | ICD-10-CM | POA: Diagnosis not present

## 2015-09-09 DIAGNOSIS — I639 Cerebral infarction, unspecified: Secondary | ICD-10-CM | POA: Diagnosis not present

## 2015-09-09 DIAGNOSIS — E119 Type 2 diabetes mellitus without complications: Secondary | ICD-10-CM | POA: Diagnosis not present

## 2015-09-09 DIAGNOSIS — I340 Nonrheumatic mitral (valve) insufficiency: Secondary | ICD-10-CM | POA: Diagnosis not present

## 2015-09-09 DIAGNOSIS — E785 Hyperlipidemia, unspecified: Secondary | ICD-10-CM | POA: Diagnosis not present

## 2015-10-05 ENCOUNTER — Ambulatory Visit: Payer: Medicare Other | Admitting: Podiatry

## 2015-10-19 DIAGNOSIS — M25552 Pain in left hip: Secondary | ICD-10-CM | POA: Diagnosis not present

## 2015-10-19 DIAGNOSIS — M1712 Unilateral primary osteoarthritis, left knee: Secondary | ICD-10-CM | POA: Diagnosis not present

## 2015-10-28 DIAGNOSIS — M25552 Pain in left hip: Secondary | ICD-10-CM | POA: Diagnosis not present

## 2015-11-02 DIAGNOSIS — M25552 Pain in left hip: Secondary | ICD-10-CM | POA: Diagnosis not present

## 2015-11-26 DIAGNOSIS — E119 Type 2 diabetes mellitus without complications: Secondary | ICD-10-CM | POA: Diagnosis not present

## 2015-11-26 DIAGNOSIS — I352 Nonrheumatic aortic (valve) stenosis with insufficiency: Secondary | ICD-10-CM | POA: Diagnosis not present

## 2015-11-26 DIAGNOSIS — E669 Obesity, unspecified: Secondary | ICD-10-CM | POA: Diagnosis not present

## 2015-11-26 DIAGNOSIS — E785 Hyperlipidemia, unspecified: Secondary | ICD-10-CM | POA: Diagnosis not present

## 2015-11-26 DIAGNOSIS — I639 Cerebral infarction, unspecified: Secondary | ICD-10-CM | POA: Diagnosis not present

## 2015-11-26 DIAGNOSIS — I340 Nonrheumatic mitral (valve) insufficiency: Secondary | ICD-10-CM | POA: Diagnosis not present

## 2015-11-26 DIAGNOSIS — I10 Essential (primary) hypertension: Secondary | ICD-10-CM | POA: Diagnosis not present

## 2015-11-26 DIAGNOSIS — I482 Chronic atrial fibrillation: Secondary | ICD-10-CM | POA: Diagnosis not present

## 2015-11-26 DIAGNOSIS — M1990 Unspecified osteoarthritis, unspecified site: Secondary | ICD-10-CM | POA: Diagnosis not present

## 2015-12-10 DIAGNOSIS — M25552 Pain in left hip: Secondary | ICD-10-CM | POA: Diagnosis not present

## 2015-12-14 ENCOUNTER — Encounter: Payer: Self-pay | Admitting: Podiatry

## 2015-12-14 ENCOUNTER — Ambulatory Visit (INDEPENDENT_AMBULATORY_CARE_PROVIDER_SITE_OTHER): Payer: Medicare Other | Admitting: Podiatry

## 2015-12-14 DIAGNOSIS — M79673 Pain in unspecified foot: Secondary | ICD-10-CM

## 2015-12-14 DIAGNOSIS — B351 Tinea unguium: Secondary | ICD-10-CM | POA: Diagnosis not present

## 2015-12-14 DIAGNOSIS — M2011 Hallux valgus (acquired), right foot: Secondary | ICD-10-CM

## 2015-12-14 DIAGNOSIS — E1140 Type 2 diabetes mellitus with diabetic neuropathy, unspecified: Secondary | ICD-10-CM | POA: Diagnosis not present

## 2015-12-14 MED ORDER — MELOXICAM 7.5 MG PO TABS: 7.5000 mg | ORAL_TABLET | Freq: Every day | ORAL | Status: DC

## 2015-12-14 NOTE — Progress Notes (Addendum)
Patient ID: Diane [name], female   DOB: 1941/02/18, 75 y.o.   MRN: [id number]   Complaint:  Visit Type: Patient returns to my office for continued preventative foot care services. Complaint: Patient states" my nails have grown long and thick and become painful to walk and wear shoes" Patient has been diagnosed with DM with neuropathy.. The patient presents for preventative foot care services. No changes to ROS  Podiatric Exam: Vascular: dorsalis pedis and posterior tibial pulses are palpable bilateral. Capillary return is immediate. Temperature gradient is WNL. Skin turgor WNL  Sensorium: Diminished  Semmes Weinstein monofilament test. Normal tactile sensation bilaterally. Nail Exam: Pt has thick disfigured discolored nails with subungual debris noted bilateral entire nail hallux through fifth toenails Ulcer Exam: There is no evidence of ulcer or pre-ulcerative changes or infection. Orthopedic Exam: Muscle tone and strength are WNL. No limitations in general ROM. No crepitus or effusions noted. Foot type and digits show no abnormalities.HAV 1st MPJ B/L. Skin: No Porokeratosis. No infection or ulcers  Diagnosis:  Onychomycosis, , Pain in right toe, pain in left toes  Treatment & Plan Procedures and Treatment: Consent by patient was obtained for treatment procedures. The patient understood the discussion of treatment and procedures well. All questions were answered thoroughly reviewed. Debridement of mycotic and hypertrophic toenails, 1 through 5 bilateral and clearing of subungual debris. No ulceration, no infection noted. Initiate diabetic footgear. Prescribe Mobic 7.5 .Return Visit-Office Procedure: Patient instructed to return to the office for a follow up visit 3 months for continued evaluation and treatment.   Gardiner Barefoot DPM

## 2015-12-14 NOTE — Addendum Note (Signed)
Addended by: Ezzard Flax, AMMIE L on: 12/14/2015 03:24 PM   Modules accepted: Orders

## 2015-12-23 DIAGNOSIS — M25552 Pain in left hip: Secondary | ICD-10-CM | POA: Diagnosis not present

## 2015-12-24 ENCOUNTER — Emergency Department (HOSPITAL_COMMUNITY)
Admission: EM | Admit: 2015-12-24 | Discharge: 2015-12-24 | Disposition: A | Discharge status: Home / Self Care | Payer: Medicare Other | Attending: Emergency Medicine | Admitting: Emergency Medicine

## 2015-12-24 ENCOUNTER — Encounter (HOSPITAL_COMMUNITY): Payer: Self-pay | Admitting: Emergency Medicine

## 2015-12-24 ENCOUNTER — Emergency Department (HOSPITAL_COMMUNITY): Payer: Medicare Other

## 2015-12-24 DIAGNOSIS — S62501A Fracture of unspecified phalanx of right thumb, initial encounter for closed fracture: Secondary | ICD-10-CM

## 2015-12-24 DIAGNOSIS — E1140 Type 2 diabetes mellitus with diabetic neuropathy, unspecified: Secondary | ICD-10-CM | POA: Diagnosis not present

## 2015-12-24 DIAGNOSIS — I2510 Atherosclerotic heart disease of native coronary artery without angina pectoris: Secondary | ICD-10-CM | POA: Diagnosis not present

## 2015-12-24 DIAGNOSIS — S62511A Displaced fracture of proximal phalanx of right thumb, initial encounter for closed fracture: Secondary | ICD-10-CM | POA: Insufficient documentation

## 2015-12-24 DIAGNOSIS — S0011XA Contusion of right eyelid and periocular area, initial encounter: Secondary | ICD-10-CM | POA: Diagnosis not present

## 2015-12-24 DIAGNOSIS — S6991XA Unspecified injury of right wrist, hand and finger(s), initial encounter: Secondary | ICD-10-CM | POA: Diagnosis present

## 2015-12-24 DIAGNOSIS — S0990XA Unspecified injury of head, initial encounter: Secondary | ICD-10-CM | POA: Diagnosis not present

## 2015-12-24 DIAGNOSIS — Z8544 Personal history of malignant neoplasm of other female genital organs: Secondary | ICD-10-CM | POA: Diagnosis not present

## 2015-12-24 DIAGNOSIS — Z853 Personal history of malignant neoplasm of breast: Secondary | ICD-10-CM | POA: Diagnosis not present

## 2015-12-24 DIAGNOSIS — Z7984 Long term (current) use of oral hypoglycemic drugs: Secondary | ICD-10-CM | POA: Diagnosis not present

## 2015-12-24 DIAGNOSIS — S064X0A Epidural hemorrhage without loss of consciousness, initial encounter: Secondary | ICD-10-CM | POA: Diagnosis not present

## 2015-12-24 DIAGNOSIS — Y9301 Activity, walking, marching and hiking: Secondary | ICD-10-CM | POA: Diagnosis not present

## 2015-12-24 DIAGNOSIS — I10 Essential (primary) hypertension: Secondary | ICD-10-CM | POA: Diagnosis not present

## 2015-12-24 DIAGNOSIS — M546 Pain in thoracic spine: Secondary | ICD-10-CM | POA: Diagnosis not present

## 2015-12-24 DIAGNOSIS — Y999 Unspecified external cause status: Secondary | ICD-10-CM | POA: Insufficient documentation

## 2015-12-24 DIAGNOSIS — W19XXXA Unspecified fall, initial encounter: Secondary | ICD-10-CM

## 2015-12-24 DIAGNOSIS — S0511XA Contusion of eyeball and orbital tissues, right eye, initial encounter: Secondary | ICD-10-CM | POA: Diagnosis not present

## 2015-12-24 DIAGNOSIS — Y9289 Other specified places as the place of occurrence of the external cause: Secondary | ICD-10-CM | POA: Diagnosis not present

## 2015-12-24 DIAGNOSIS — S098XXA Other specified injuries of head, initial encounter: Secondary | ICD-10-CM | POA: Diagnosis not present

## 2015-12-24 DIAGNOSIS — Z79899 Other long term (current) drug therapy: Secondary | ICD-10-CM | POA: Insufficient documentation

## 2015-12-24 DIAGNOSIS — R51 Headache: Secondary | ICD-10-CM | POA: Insufficient documentation

## 2015-12-24 DIAGNOSIS — W0110XA Fall on same level from slipping, tripping and stumbling with subsequent striking against unspecified object, initial encounter: Secondary | ICD-10-CM | POA: Diagnosis not present

## 2015-12-24 DIAGNOSIS — S0083XA Contusion of other part of head, initial encounter: Secondary | ICD-10-CM

## 2015-12-24 MED ORDER — HYDROCODONE-ACETAMINOPHEN 5-325 MG PO TABS: 1.0000 | ORAL_TABLET | ORAL | 0 refills | Status: DC | PRN

## 2015-12-24 MED ORDER — HYDROCODONE-ACETAMINOPHEN 5-325 MG PO TABS
1.0000 | ORAL_TABLET | ORAL | 0 refills | Status: DC | PRN
Start: 2015-12-24 — End: 2015-12-24

## 2015-12-24 MED ORDER — HYDROCODONE-ACETAMINOPHEN 5-325 MG PO TABS
1.0000 | ORAL_TABLET | Freq: Once | ORAL | Status: AC
  Administered 2015-12-24: 1 via ORAL
  Filled 2015-12-24: qty 1

## 2015-12-24 MED ORDER — BACITRACIN ZINC 500 UNIT/GM EX OINT
1.0000 "application " | TOPICAL_OINTMENT | Freq: Two times a day (BID) | CUTANEOUS | Status: DC
  Administered 2015-12-24: 1 via TOPICAL
  Filled 2015-12-24: qty 0.9

## 2015-12-24 NOTE — ED Notes (Signed)
Patient Alert and oriented X4. Stable and ambulatory. Patient verbalized understanding of the discharge instructions.  Patient belongings were taken by the patient.  

## 2015-12-24 NOTE — ED Notes (Signed)
Patient wanted to eat a sandwich before taking the vicodin.  I gave the vicodin tablet to Aaron Edelman, RN  to administer once patient had finished sandwich.

## 2015-12-24 NOTE — ED Notes (Signed)
Patient back from radiology.

## 2015-12-24 NOTE — ED Notes (Signed)
Gave patient water with meds - tolerated well.

## 2015-12-24 NOTE — Progress Notes (Signed)
Orthopedic Tech Progress Note Patient Details:  Diane [name] 31-Aug-1940 [id number]  Ortho Devices Type of Ortho Device: Ace wrap, Thumb spica splint Ortho Device/Splint Location: rue Ortho Device/Splint Interventions: Application Arm foam sl;ing  [name] 12/24/2015, 3:31 PM

## 2015-12-24 NOTE — ED Triage Notes (Signed)
Per EMS, patient had walked outside and slipped on debris and fell on concrete.   Patient states she fell approximately 1120 today.   Patient states no LOC, no dizziness.  Patient has hematoma over R eye and an abrasion to same.   Patient has R thumb pain where her hand broke her fall.   Denies other symptoms.   Patient is on a blood thinner at home.   Patient vital signs for EMS were HR 72, R 16. BP130/72 and CBG 138.   Patient alert and oriented x 4.

## 2015-12-24 NOTE — ED Notes (Signed)
Patient ambulated from the hallway stretcher to the stretcher in Pod A5 without any difficulty or distress; patient is now getting undressed and into a gown; 2 warm blankets were given; Amy, RN is at bedside

## 2015-12-24 NOTE — ED Provider Notes (Signed)
[facility] DEPT Provider Note   CSN: [id number] Arrival date & time: 12/24/15  1208  First Provider Contact:  First MD Initiated Contact with Patient 12/24/15 1422        History   Chief Complaint Chief Complaint  Patient presents with  . Fall    HPI Diane [name] is a 75 y.o. female.  HPI Diane [name] is a 75 y.o. female history of A. fib on Pradaxa, diabetes, hypertension, here for evaluation after fall. Patient reports she was walking out of her apartment, tripped over some tree branches falling forward onto her outstretched right hand. Patient is left-handed. She reports hitting her right thumb and the right side of her face. She reports mildly blurred vision at that time it is improving. She reports associated mild headache. She denies any other numbness or weakness, focal neck pain, chest pain, short of breath, abdominal pain. States she is a patient of [facility].  Past Medical History:  Diagnosis Date  . Anxiety   . Arthritis   . Atrial fibrillation ([place])   . Breast cancer ([place])   . Coronary artery disease   . Depression   . Diabetes mellitus without complication (HCC)    diet  . Dysrhythmia    afib  . Fibromyalgia   . Headache(784.0)   . Herniated disc   . Hyperlipemia   . Hypertension   . Iron deficiency anemia   . Lower back pain   . Lumbar stenosis    L4-5  . S/P radiation therapy 01/23/2013-03/06/2013   Right breast / 46 Gy in 23 fractions / Right breast boost / 14 Gy in 7 fractions  . Shortness of breath    exertion  . Spondylolysis   . 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 Status post chemotherapy 11/04/2012 - 01/06/2013.   Docetaxel/Cytoxan Q 3 Weeks x 4 cycles  . Torn rotator cuff     Patient Active Problem List   Diagnosis Date Noted  . Chest pain 09/11/2014  . Hypokalemia 01/15/2014  . Neuropathy due to chemotherapeutic drug ([place]) 03/05/2013  . Swelling of limb 01/23/2013  . Oropharyngeal  candidiasis 01/13/2013  . Chemotherapy induced neutropenia ([place]) 01/13/2013  . UTI (urinary tract infection) 12/25/2012  . Cancer of central portion of right female breast ([place]) 09/06/2012  . Vulva cancer ([place]) 08/16/2011  . INSOMNIA 08/26/2010  . HEADACHE 08/26/2010  . TOBACCO USER 11/04/2009  . POSTMENOPAUSAL STATUS 11/04/2009  . DYSLIPIDEMIA 07/30/2009  . HAIR LOSS 07/30/2009  . ANEMIA 07/26/2009  . SINUSITIS, ACUTE 04/02/2009  . DIABETES MELLITUS, TYPE II 01/01/2009  . URINARY TRACT INFECTION SITE NOT SPECIFIED 11/26/2008  . BREAST PAIN, RIGHT 11/12/2008  . ANGIOEDEMA 10/05/2008  . CHEST PAIN, ATYPICAL 09/01/2008  . ALLERGIC RHINITIS 05/05/2008  . VAGINITIS 05/05/2008  . CAROTID BRUIT, RIGHT 11/12/2007  . SHOULDER PAIN, LEFT, CHRONIC 08/09/2007  . 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 DEGENERATIVE DISC DISEASE, LUMBOSACRAL SPINE 08/09/2007  . HYPERTENSION, BENIGN ESSENTIAL 03/20/2007  . IRREGULAR HEART RATE 03/20/2007  . BRONCHITIS, ACUTE 03/20/2007  . DEPRESSION 02/27/2007  . MURMUR 02/27/2007    Past Surgical History:  Procedure Laterality Date  . ABDOMINAL HYSTERECTOMY    . COLONOSCOPY    . DILATION AND CURETTAGE OF UTERUS    . LEFT HEART CATHETERIZATION WITH CORONARY ANGIOGRAM N/A 09/14/2014   Procedure: LEFT HEART CATHETERIZATION WITH CORONARY ANGIOGRAM;  Surgeon: [name], MD;  Location: [facility] CATH LAB;  Service: Cardiovascular;  Laterality: N/A;  . PARTIAL MASTECTOMY WITH NEEDLE LOCALIZATION AND AXILLARY SENTINEL LYMPH NODE BX Right 10/08/2012   Procedure: RIGHT PARTIAL MASTECTOMY WITH NEEDLE LOCALIZATION AND RIGHT AXILLARY SENTINEL LYMPH NODE BX;  Surgeon: Adin Hector, MD;  Location: [facility];  Service: General;  Laterality: Right;  Injection of 1% Methylene Blue dye into right breast  . PORTACATH PLACEMENT Left 10/08/2012   Procedure: INSERTION PORT-A-CATH WITH ULTRASOUND GUIDANCE;  Surgeon: Adin Hector, MD;  Location: [facility];  Service:  General;  Laterality: Left;  Using 53F [employer] [place]  . Posterior Lumbar Arthrodesis, Pedicle screw fixation, Posterolateral Arthodesis  03/17/11  . SHOULDER ARTHROSCOPY WITH ROTATOR CUFF REPAIR AND SUBACROMIAL DECOMPRESSION  06/04/2012   Procedure: SHOULDER ARTHROSCOPY WITH ROTATOR CUFF REPAIR AND SUBACROMIAL DECOMPRESSION;  Surgeon: Nita Sells, MD;  Location: [facility];  Service: Orthopedics;  Laterality: Left;  LEFT SHOULDER ARTHROSCOPY WITH ROTATOR CUFF REPAIR AND SUBACROMIAL DECOMPRESSION AND DISTAL CLAVICLE RESECTION  . TONSILLECTOMY    . TUBAL LIGATION    . VULVECTOMY PARTIAL    . Wide Local Excision  01/2010   Bilateral groin excisions, re-excision of vulva    OB History    No data available       Home Medications    Prior to Admission medications   Medication Sig Start Date End Date Taking? Authorizing Provider  albuterol (PROVENTIL HFA;VENTOLIN HFA) 108 (90 BASE) MCG/ACT inhaler Inhale 2 puffs into the lungs every 4 (four) hours as needed for wheezing or shortness of breath. 05/12/15  Yes Melony Overly, MD  amiodarone (PACERONE) 200 MG tablet Take 200 mg by mouth daily.  12/09/14  Yes Historical Provider, MD  dabigatran (PRADAXA) 75 MG CAPS Take 75 mg by mouth every 12 (twelve) hours.   Yes Historical Provider, MD  FLUoxetine (PROZAC) 10 MG capsule Take 10 mg by mouth daily.   Yes Historical Provider, MD  gabapentin (NEURONTIN) 100 MG capsule 1 tab by mouth in morning, 1 tab by mouth mid-day, and 3 tabs by mouth at bedtime 03/05/13  Yes Amy G Berry, PA-C  losartan (COZAAR) 100 MG tablet Take 1 tablet (100 mg total) by mouth daily. 09/15/14  Yes [name], MD  meloxicam (MOBIC) 7.5 MG tablet Take 1 tablet (7.5 mg total) by mouth daily. 12/14/15  Yes Gardiner Barefoot, DPM  metFORMIN (GLUCOPHAGE-XR) 500 MG 24 hr tablet Take 1 tablet (500 mg total) by mouth daily with breakfast. Total dose of 1000mg  daily 09/16/14  Yes [name], MD  NITROSTAT 0.4 MG SL  tablet Place 0.4 mg under the tongue every 5 (five) minutes as needed for chest pain.  10/14/12  Yes Historical Provider, MD  potassium chloride (K-DUR) 10 MEQ tablet Take 10 mEq by mouth daily.  09/04/14  Yes Historical Provider, MD  pravastatin (PRAVACHOL) 40 MG tablet Take 40 mg by mouth every evening.   Yes Historical Provider, MD  traZODone (DESYREL) 50 MG tablet Take 50 mg by mouth at bedtime.   Yes Historical Provider, MD  triamterene-hydrochlorothiazide (DYAZIDE) 37.5-25 MG per capsule Take 1 capsule by mouth daily.  08/10/14  Yes Historical Provider, MD  ferrous sulfate 325 (65 FE) MG tablet Take 325 mg by mouth daily with breakfast.    Historical Provider, MD  HYDROcodone-acetaminophen (NORCO/VICODIN) 5-325 MG per tablet Take 1-2 tablets by mouth every 4 (four) hours as needed for pain. Patient not taking: Reported on 12/24/2015 10/08/12   [name], MD  HYDROcodone-acetaminophen (NORCO/VICODIN) 5-325 MG tablet Take 1-2 tablets  by mouth every 4 (four) hours as needed. 12/24/15   Comer Locket, PA-C    Family History Family History  Problem Relation Age of Onset  . Stroke Mother   . Alzheimer's disease Mother   . Heart attack Father   . Heart attack Brother     Social History Social History  Substance Use Topics  . Smoking status: Former Smoker    Years: 50.00    Types: Cigarettes    Quit date: 05/29/2009  . Smokeless tobacco: Never Used  . Alcohol use No     Allergies   Shellfish allergy   Review of Systems Review of Systems A 10 point review of systems was completed and was negative except for pertinent positives and negatives as mentioned in the history of present illness    Physical Exam Updated Vital Signs BP (!) 136/51 (BP Location: Right Arm)   Pulse (!) 54   Temp 98 F (36.7 C) (Oral)   Resp 16   SpO2 98%   Physical Exam  Constitutional: She appears well-developed. No distress.  Awake, alert and nontoxic in appearance  HENT:  Head: Normocephalic  and atraumatic.  Right Ear: External ear normal.  Left Ear: External ear normal.  Mouth/Throat: Oropharynx is clear and moist.  Eyes: EOM are normal. Pupils are equal, round, and reactive to light.  Right eye with medial conjunctival injection. Moderate-sized hematoma around superior right orbit with small abrasion.  Neck: Normal range of motion. No JVD present.  Full active range of motion of neck with no midline bony tenderness.  Cardiovascular: Normal rate, regular rhythm, normal heart sounds and intact distal pulses.   Pulmonary/Chest: Effort normal and breath sounds normal. No stridor.  Abdominal: Soft. There is no tenderness.  Musculoskeletal: Normal range of motion.  Diffusely swollen left thumb, tenderness of proximal phalanx. Distal pulses intact with brisk cap refill. Range of motion decreased secondary to pain. Moves all other large joints without difficulty.  Neurological:  Awake, alert, cooperative and aware of situation; motor strength intact and equal bilaterally; sensation normal to light touch bilaterally; no facial asymmetry; tongue midline; major cranial nerves appear intact;  baseline gait without new ataxia.  Skin: No rash noted. She is not diaphoretic.  Psychiatric: She has a normal mood and affect. Her behavior is normal. Thought content normal.  Nursing note and vitals reviewed.    ED Treatments / Results  Labs (all labs ordered are listed, but only abnormal results are displayed) Labs Reviewed - No data to display  EKG  EKG Interpretation None       Radiology Ct Head Wo Contrast  Result Date: 12/24/2015 CLINICAL DATA:  Fall, right forehead abrasion EXAM: CT HEAD WITHOUT CONTRAST TECHNIQUE: Contiguous axial images were obtained from the base of the skull through the vertex without intravenous contrast. COMPARISON:  12/28/2010 FINDINGS: No evidence of parenchymal hemorrhage or extra-axial fluid collection. No mass lesion, mass effect, or midline shift. No CT  evidence of acute infarction. Mild subcortical white matter and periventricular small vessel ischemic changes. Intracranial atherosclerosis. Cerebral volume is within normal limits.  No ventriculomegaly. Partial opacification of the right ethmoid air cells. Mastoid air cells are clear. Soft tissue swelling/ hematoma overlying the right frontal bone (series 2/image 8). No evidence of calvarial fracture. IMPRESSION: Soft tissue swelling/hematoma overlying the right frontal bone. No evidence of calvarial fracture. Mild small vessel ischemic changes. Electronically Signed   By: Julian Hy M.D.   On: 12/24/2015 14:24  Dg Hand Complete Right  Result Date: 12/24/2015 CLINICAL DATA:  Pain following fall EXAM: RIGHT HAND - COMPLETE 3+ VIEW COMPARISON:  None. FINDINGS: Frontal, oblique, and lateral views were obtained. There is a comminuted obliquely oriented fracture of the first proximal phalanx with mild dorsal and lateral displacement of the distal major fracture fragment with respect to the major proximal fragment. No other fractures are evident. No dislocation. Joint spaces appear unremarkable. No erosive change. IMPRESSION: Comminuted fracture, first proximal phalanx with displacement of major fracture fragments. No other fractures. No dislocation. No appreciable arthropathic change. Electronically Signed   By: Lowella Grip III M.D.   On: 12/24/2015 13:16   Procedures Procedures (including critical care time)  Medications Ordered in ED Medications  bacitracin ointment 1 application (1 application Topical Given 12/24/15 1515)  HYDROcodone-acetaminophen (NORCO/VICODIN) 5-325 MG per tablet 1 tablet (1 tablet Oral Given 12/24/15 [id number])    SPLINT APPLICATION Date/Time: [id number] PM Authorized by: Verl Dicker Consent: Verbal consent obtained. Risks and benefits: risks, benefits and alternatives were discussed Consent given by: patient Splint applied by: orthopedic technician Location  details: Right wrist  Splint type: Thumb spica  Supplies used: Fiberglas  Post-procedure: The splinted body part was neurovascularly unchanged following the procedure. Patient tolerance: Patient tolerated the procedure well with no immediate complications.    Initial Impression / Assessment and Plan / ED Course  I have reviewed the triage vital signs and the nursing notes.  Pertinent labs & imaging results that were available during my care of the patient were reviewed by me and considered in my medical decision making (see chart for details).  Clinical Course    Patient On Pradaxa for A. fib comes in for evaluation after mechanical fall. On arrival she is hemodynamically stable, afebrile, alert and oriented 4. Sustained right thumb injury, hematoma over right eyebrow. She remains neurovascularly intact. Will obtain CT head, plain films of right thumb CT is unremarkable, plain comes right thumb show a comminuted fracture of proximal phalanx. Discussed with my attending, Dr. Ellender Hose. Plan to consult [facility]. Discussed with orthopedist, Dr. Berenice Primas. Will place patient in thumb spica splint, to follow-up in the office tomorrow with Dr. Grandville Silos between 9:30 and 10:30. Return precautions discussed. Prior to patient discharge, I discussed and reviewed this case with Dr. Ellender Hose     Final Clinical Impressions(s) / ED Diagnoses   Final diagnoses:  Fall, initial encounter  Facial hematoma, initial encounter  Thumb fracture, right, closed, initial encounter    New Prescriptions New Prescriptions   HYDROCODONE-ACETAMINOPHEN (NORCO/VICODIN) 5-325 MG TABLET    Take 1-2 tablets by mouth every 4 (four) hours as needed.     Comer Locket, PA-C 12/24/15 1522    Duffy Bruce, MD 12/25/15 [phone]

## 2015-12-24 NOTE — Discharge Instructions (Signed)
You have a broken bone in your right thumb. You will need to see orthopedics in the morning. Please go to the [facility] orthopedics and see Dr. Grandville Silos between 9:30 and 10:30. You may eat breakfast. Take your pain medicine as prescribed and as we discussed. Do not take this medication before driving as it can make you very drowsy. Return to ED for new or worsening symptoms as we discussed.

## 2015-12-25 DIAGNOSIS — M25552 Pain in left hip: Secondary | ICD-10-CM | POA: Diagnosis not present

## 2015-12-27 ENCOUNTER — Other Ambulatory Visit: Payer: Self-pay | Admitting: Orthopedic Surgery

## 2015-12-29 ENCOUNTER — Encounter (HOSPITAL_BASED_OUTPATIENT_CLINIC_OR_DEPARTMENT_OTHER): Payer: Self-pay | Admitting: *Deleted

## 2015-12-29 NOTE — Progress Notes (Signed)
Bring all medications. Requested last office notes, EKG and any other testing from Dr. Zenia Resides office. Pt coming for BMET on Monday.

## 2015-12-30 NOTE — Progress Notes (Signed)
Dr. Kalman Shan reviewed office notes, Ekg and cardiac cath - ok for surgery.

## 2016-01-03 ENCOUNTER — Other Ambulatory Visit: Payer: Self-pay | Admitting: Geriatric Medicine

## 2016-01-03 ENCOUNTER — Encounter (HOSPITAL_BASED_OUTPATIENT_CLINIC_OR_DEPARTMENT_OTHER)
Admission: RE | Admit: 2016-01-03 | Discharge: 2016-01-03 | Disposition: A | Discharge status: Home / Self Care | Payer: Medicare Other | Source: Ambulatory Visit | Attending: Orthopedic Surgery | Admitting: Orthopedic Surgery

## 2016-01-03 ENCOUNTER — Ambulatory Visit
Admission: RE | Admit: 2016-01-03 | Discharge: 2016-01-03 | Disposition: A | Discharge status: Home / Self Care | Payer: Medicare Other | Source: Ambulatory Visit | Attending: Geriatric Medicine | Admitting: Geriatric Medicine

## 2016-01-03 DIAGNOSIS — S62511A Displaced fracture of proximal phalanx of right thumb, initial encounter for closed fracture: Secondary | ICD-10-CM | POA: Diagnosis not present

## 2016-01-03 DIAGNOSIS — S82832A Other fracture of upper and lower end of left fibula, initial encounter for closed fracture: Secondary | ICD-10-CM | POA: Diagnosis not present

## 2016-01-03 DIAGNOSIS — E119 Type 2 diabetes mellitus without complications: Secondary | ICD-10-CM | POA: Diagnosis not present

## 2016-01-03 DIAGNOSIS — M25572 Pain in left ankle and joints of left foot: Secondary | ICD-10-CM

## 2016-01-03 DIAGNOSIS — Z91013 Allergy to seafood: Secondary | ICD-10-CM | POA: Diagnosis not present

## 2016-01-03 DIAGNOSIS — Z87891 Personal history of nicotine dependence: Secondary | ICD-10-CM | POA: Diagnosis not present

## 2016-01-03 DIAGNOSIS — Z853 Personal history of malignant neoplasm of breast: Secondary | ICD-10-CM | POA: Diagnosis not present

## 2016-01-03 DIAGNOSIS — I10 Essential (primary) hypertension: Secondary | ICD-10-CM | POA: Diagnosis not present

## 2016-01-03 DIAGNOSIS — E785 Hyperlipidemia, unspecified: Secondary | ICD-10-CM | POA: Diagnosis not present

## 2016-01-03 DIAGNOSIS — M797 Fibromyalgia: Secondary | ICD-10-CM | POA: Diagnosis not present

## 2016-01-03 DIAGNOSIS — M1990 Unspecified osteoarthritis, unspecified site: Secondary | ICD-10-CM | POA: Diagnosis not present

## 2016-01-03 DIAGNOSIS — Z90710 Acquired absence of both cervix and uterus: Secondary | ICD-10-CM | POA: Diagnosis not present

## 2016-01-03 DIAGNOSIS — I2510 Atherosclerotic heart disease of native coronary artery without angina pectoris: Secondary | ICD-10-CM | POA: Diagnosis not present

## 2016-01-03 DIAGNOSIS — F329 Major depressive disorder, single episode, unspecified: Secondary | ICD-10-CM | POA: Diagnosis not present

## 2016-01-03 DIAGNOSIS — I4891 Unspecified atrial fibrillation: Secondary | ICD-10-CM | POA: Diagnosis not present

## 2016-01-03 DIAGNOSIS — I481 Persistent atrial fibrillation: Secondary | ICD-10-CM | POA: Diagnosis not present

## 2016-01-03 DIAGNOSIS — F419 Anxiety disorder, unspecified: Secondary | ICD-10-CM | POA: Diagnosis not present

## 2016-01-03 DIAGNOSIS — Z7984 Long term (current) use of oral hypoglycemic drugs: Secondary | ICD-10-CM | POA: Diagnosis not present

## 2016-01-03 LAB — BASIC METABOLIC PANEL
Anion gap: 9 (ref 5–15)
BUN: 20 mg/dL (ref 6–20)
CO2: 28 mmol/L (ref 22–32)
Calcium: 8.8 mg/dL — ABNORMAL LOW (ref 8.9–10.3)
Chloride: 102 mmol/L (ref 101–111)
Creatinine, Ser: 1.51 mg/dL — ABNORMAL HIGH (ref 0.44–1.00)
GFR calc Af Amer: 39 mL/min — ABNORMAL LOW (ref >60)
GFR calc non Af Amer: 33 mL/min — ABNORMAL LOW (ref >60)
Glucose, Bld: 92 mg/dL (ref 65–99)
Potassium: 3.4 mmol/L — ABNORMAL LOW (ref 3.5–5.1)
Sodium: 139 mmol/L (ref 135–145)

## 2016-01-03 NOTE — H&P (Signed)
Diane [name] is an 75 y.o. female.   CC / Reason for Visit: Right thumb injury HPI: This patient is a 75 year old female who presents for evaluation of a right thumb injury that occurred yesterday when she fell on the sidewalk coming out of her apartment.  She landed onto outstretched hands.  She has been evaluated, x-rays obtained, and a thumb spica splint applied.  She reports needing no additional pain medications today.  She reports that she takes Pradaxa for atrial fibrillation, and in the past has simply stopped it for 5 days prior to invasive procedures.  Past Medical History:  Diagnosis Date  . Anxiety   . Arthritis    back  . Atrial fibrillation ([place])   . Breast cancer ([place])    right breast cancer - 3 years ago  . Coronary artery disease   . Depression   . Diabetes mellitus without complication (HCC)    diet  . Dysrhythmia    afib  . Fibromyalgia   . Herniated disc   . Hyperlipemia   . Hypertension   . Iron deficiency anemia   . Lower back pain   . Lumbar stenosis    L4-5  . S/P radiation therapy 01/23/2013-03/06/2013   Right breast / 46 Gy in 23 fractions / Right breast boost / 14 Gy in 7 fractions  . Spondylolysis   . 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 Status post chemotherapy 11/04/2012 - 01/06/2013.   Docetaxel/Cytoxan Q 3 Weeks x 4 cycles  . Torn rotator cuff     Past Surgical History:  Procedure Laterality Date  . ABDOMINAL HYSTERECTOMY    . COLONOSCOPY    . DILATION AND CURETTAGE OF UTERUS    . LEFT HEART CATHETERIZATION WITH CORONARY ANGIOGRAM N/A 09/14/2014   Procedure: LEFT HEART CATHETERIZATION WITH CORONARY ANGIOGRAM;  Surgeon: [name], MD;  [facility]: [facility];  Service: Cardiovascular;  Laterality: N/A;  . PARTIAL MASTECTOMY WITH NEEDLE LOCALIZATION AND AXILLARY SENTINEL LYMPH NODE BX Right 10/08/2012   Procedure: RIGHT PARTIAL MASTECTOMY WITH NEEDLE LOCALIZATION AND RIGHT AXILLARY SENTINEL LYMPH NODE BX;   Surgeon: Adin Hector, MD;  Location: [place];  Service: [name];  Laterality: Right;  Injection of 1% Methylene Blue dye into right breast  . PORTACATH PLACEMENT Left 10/08/2012   Procedure: INSERTION PORT-A-CATH WITH ULTRASOUND GUIDANCE;  Surgeon: Adin Hector, MD;  Location: [place];  Service: [name];  Laterality: Left;  Using 71F Powerport Clearvue  . Posterior Lumbar Arthrodesis, Pedicle screw fixation, Posterolateral Arthodesis  03/17/11  . SHOULDER ARTHROSCOPY WITH ROTATOR CUFF REPAIR AND SUBACROMIAL DECOMPRESSION  06/04/2012   Procedure: SHOULDER ARTHROSCOPY WITH ROTATOR CUFF REPAIR AND SUBACROMIAL DECOMPRESSION;  Surgeon: Nita Sells, MD;  Location: [place];  Service: Orthopedics;  Laterality: Left;  LEFT SHOULDER ARTHROSCOPY WITH ROTATOR CUFF REPAIR AND SUBACROMIAL DECOMPRESSION AND DISTAL CLAVICLE RESECTION  . TONSILLECTOMY    . TUBAL LIGATION    . VULVECTOMY PARTIAL    . Wide Local Excision  01/2010   Bilateral groin excisions, re-excision of vulva    Family History  Problem Relation Age of Onset  . Stroke Mother   . Alzheimer's disease Mother   . Heart attack Father   . Heart attack Brother    Social History:  reports that she quit smoking about 6 years ago. Her smoking use included Cigarettes. She quit after 50.00 years of use. She has never used smokeless tobacco.  She reports that she drinks alcohol. She reports that she does not use drugs.  Allergies:  Allergies  Allergen Reactions  . Shellfish Allergy Anaphylaxis    No prescriptions prior to admission.    No results found for this or any previous visit (from the past 48 hour(s)). Dg Ankle Complete Left  Result Date: 01/03/2016 CLINICAL DATA:  Pt fell 1 week ago, pain and swelling in Lt lateral ankle EXAM: LEFT ANKLE COMPLETE - 3+ VIEW COMPARISON:  None. FINDINGS: There is a small avulsion fracture from the inferior margin of the distal fibula which appears recent. No other fractures.  Ankle mortise is normally spaced and aligned. No arthropathic change. There is mild soft tissue swelling laterally. IMPRESSION: Small avulsion fracture from the inferior margin of the distal fibula. No other fractures. Normally aligned ankle mortise. Electronically Signed   By: Lajean Manes M.D.   On: 01/03/2016 10:23    Review of Systems  All other systems reviewed and are negative.   Height 5\' 7"  (1.702 m), weight 89.8 kg (198 lb). Physical Exam  Constitutional:  WD, WN, NAD HEENT:  NCAT, EOMI Neuro/Psych:  Alert & oriented to person, place, and time; appropriate mood & affect Lymphatic: No generalized UE edema or lymphadenopathy Extremities / MSK:  Both UE are normal with respect to appearance, ranges of motion, joint stability, muscle strength/tone, sensation, & perfusion except as otherwise noted:  The right thumb tip is warm, with brisk capillary refill.  There is normal light touch sensibility at the tip of the digit, both radially and ulnarly.  She can flex and extend through the IP joint.  Labs / Xrays:  No radiographic studies obtained today.  X-rays from 12-24-15 are reviewed and reveal a comminuted, displaced diaphyseal fracture of the right thumb proximal phalanx, with overall reasonable gross alignment.  Some malrotation is evident.  Assessment: Comminuted, mildly displaced and unstable right thumb proximal phalangeal fracture  Plan:  I discussed these findings with her and recommendations for operative reduction and stabilization of her fracture.  I would like to do this percutaneously, but and concern that the degree of reduction and stabilization achieved percutaneously may be insufficient & require open treatment with plate/screw fixation.  I am away towards the end of this coming week, and so we will plan to proceed on 01-04-16, with her holding her anticoagulant medication for 5 days prior to the procedure.  The details of the operative procedure were discussed with the  patient.  Questions were invited and answered.  In addition to the goal of the procedure, the risks of the procedure to include but not limited to bleeding; infection; damage to the nerves or blood vessels that could result in bleeding, numbness, weakness, chronic pain, and the need for additional procedures; stiffness; the need for revision surgery; and anesthetic risks were reviewed.  No specific outcome was guaranteed or implied.  Informed consent was obtained.  THOMPSON, DAVID A., MD 01/03/2016, 10:48 AM

## 2016-01-04 ENCOUNTER — Encounter (HOSPITAL_BASED_OUTPATIENT_CLINIC_OR_DEPARTMENT_OTHER): Payer: Self-pay

## 2016-01-04 ENCOUNTER — Encounter (HOSPITAL_BASED_OUTPATIENT_CLINIC_OR_DEPARTMENT_OTHER)
Admission: RE | Disposition: A | Discharge status: Home / Self Care | Payer: Self-pay | Source: Ambulatory Visit | Attending: Orthopedic Surgery

## 2016-01-04 ENCOUNTER — Ambulatory Visit (HOSPITAL_COMMUNITY): Payer: Medicare Other

## 2016-01-04 ENCOUNTER — Ambulatory Visit (HOSPITAL_BASED_OUTPATIENT_CLINIC_OR_DEPARTMENT_OTHER): Payer: Medicare Other | Admitting: Anesthesiology

## 2016-01-04 ENCOUNTER — Ambulatory Visit (HOSPITAL_BASED_OUTPATIENT_CLINIC_OR_DEPARTMENT_OTHER)
Admission: RE | Admit: 2016-01-04 | Discharge: 2016-01-04 | Disposition: A | Discharge status: Home / Self Care | Payer: Medicare Other | Source: Ambulatory Visit | Attending: Orthopedic Surgery | Admitting: Orthopedic Surgery

## 2016-01-04 DIAGNOSIS — Z91013 Allergy to seafood: Secondary | ICD-10-CM | POA: Insufficient documentation

## 2016-01-04 DIAGNOSIS — I4891 Unspecified atrial fibrillation: Secondary | ICD-10-CM | POA: Diagnosis not present

## 2016-01-04 DIAGNOSIS — Z419 Encounter for procedure for purposes other than remedying health state, unspecified: Secondary | ICD-10-CM

## 2016-01-04 DIAGNOSIS — E119 Type 2 diabetes mellitus without complications: Secondary | ICD-10-CM | POA: Diagnosis not present

## 2016-01-04 DIAGNOSIS — Z90710 Acquired absence of both cervix and uterus: Secondary | ICD-10-CM | POA: Insufficient documentation

## 2016-01-04 DIAGNOSIS — Z87891 Personal history of nicotine dependence: Secondary | ICD-10-CM | POA: Insufficient documentation

## 2016-01-04 DIAGNOSIS — Z7984 Long term (current) use of oral hypoglycemic drugs: Secondary | ICD-10-CM | POA: Insufficient documentation

## 2016-01-04 DIAGNOSIS — I10 Essential (primary) hypertension: Secondary | ICD-10-CM | POA: Diagnosis not present

## 2016-01-04 DIAGNOSIS — I2510 Atherosclerotic heart disease of native coronary artery without angina pectoris: Secondary | ICD-10-CM | POA: Diagnosis not present

## 2016-01-04 DIAGNOSIS — M797 Fibromyalgia: Secondary | ICD-10-CM | POA: Diagnosis not present

## 2016-01-04 DIAGNOSIS — S62511A Displaced fracture of proximal phalanx of right thumb, initial encounter for closed fracture: Secondary | ICD-10-CM | POA: Diagnosis not present

## 2016-01-04 DIAGNOSIS — F329 Major depressive disorder, single episode, unspecified: Secondary | ICD-10-CM | POA: Diagnosis not present

## 2016-01-04 DIAGNOSIS — M1990 Unspecified osteoarthritis, unspecified site: Secondary | ICD-10-CM | POA: Insufficient documentation

## 2016-01-04 DIAGNOSIS — F419 Anxiety disorder, unspecified: Secondary | ICD-10-CM | POA: Insufficient documentation

## 2016-01-04 DIAGNOSIS — Z853 Personal history of malignant neoplasm of breast: Secondary | ICD-10-CM | POA: Insufficient documentation

## 2016-01-04 DIAGNOSIS — E785 Hyperlipidemia, unspecified: Secondary | ICD-10-CM | POA: Diagnosis not present

## 2016-01-04 HISTORY — PX: OPEN REDUCTION INTERNAL FIXATION (ORIF) PROXIMAL PHALANX: SHX6235

## 2016-01-04 LAB — GLUCOSE, CAPILLARY: Glucose-Capillary: 90 mg/dL (ref 65–99)

## 2016-01-04 SURGERY — OPEN REDUCTION INTERNAL FIXATION (ORIF) PROXIMAL PHALANX
Anesthesia: General | Site: Thumb | Laterality: Right

## 2016-01-04 MED ORDER — ONDANSETRON HCL 4 MG/2ML IJ SOLN: 4.0000 mg | Freq: Once | INTRAMUSCULAR | Status: DC | PRN

## 2016-01-04 MED ORDER — GLYCOPYRROLATE 0.2 MG/ML IJ SOLN: 0.2000 mg | Freq: Once | INTRAMUSCULAR | Status: DC | PRN

## 2016-01-04 MED ORDER — FENTANYL CITRATE (PF) 100 MCG/2ML IJ SOLN
INTRAMUSCULAR | Status: AC
  Filled 2016-01-04: qty 2

## 2016-01-04 MED ORDER — SCOPOLAMINE 1 MG/3DAYS TD PT72: 1.0000 | MEDICATED_PATCH | Freq: Once | TRANSDERMAL | Status: DC | PRN

## 2016-01-04 MED ORDER — HYDROMORPHONE HCL 1 MG/ML IJ SOLN: 0.5000 mg | INTRAMUSCULAR | Status: DC | PRN

## 2016-01-04 MED ORDER — SUCCINYLCHOLINE CHLORIDE 20 MG/ML IJ SOLN
INTRAMUSCULAR | Status: DC | PRN
  Administered 2016-01-04: 100 mg via INTRAVENOUS

## 2016-01-04 MED ORDER — FENTANYL CITRATE (PF) 100 MCG/2ML IJ SOLN
50.0000 ug | INTRAMUSCULAR | Status: DC | PRN
  Administered 2016-01-04: 100 ug via INTRAVENOUS

## 2016-01-04 MED ORDER — DEXAMETHASONE SODIUM PHOSPHATE 10 MG/ML IJ SOLN
INTRAMUSCULAR | Status: DC | PRN
  Administered 2016-01-04: 5 mg via INTRAVENOUS

## 2016-01-04 MED ORDER — BUPIVACAINE-EPINEPHRINE (PF) 0.5% -1:200000 IJ SOLN
INTRAMUSCULAR | Status: DC | PRN
  Administered 2016-01-04: 10 mL

## 2016-01-04 MED ORDER — CEFAZOLIN SODIUM-DEXTROSE 2-4 GM/100ML-% IV SOLN
2.0000 g | INTRAVENOUS | Status: AC
  Administered 2016-01-04: 2 g via INTRAVENOUS

## 2016-01-04 MED ORDER — LACTATED RINGERS IV SOLN
INTRAVENOUS | Status: DC
  Administered 2016-01-04: 15:00:00 via INTRAVENOUS

## 2016-01-04 MED ORDER — PROPOFOL 10 MG/ML IV BOLUS
INTRAVENOUS | Status: DC | PRN
  Administered 2016-01-04: 50 mg via INTRAVENOUS
  Administered 2016-01-04: 150 mg via INTRAVENOUS
  Administered 2016-01-04: 30 mg via INTRAVENOUS

## 2016-01-04 MED ORDER — CEFAZOLIN SODIUM-DEXTROSE 2-4 GM/100ML-% IV SOLN
INTRAVENOUS | Status: AC
  Filled 2016-01-04: qty 100

## 2016-01-04 MED ORDER — EPHEDRINE SULFATE 50 MG/ML IJ SOLN
INTRAMUSCULAR | Status: DC | PRN
  Administered 2016-01-04 (×2): 10 mg via INTRAVENOUS

## 2016-01-04 MED ORDER — MIDAZOLAM HCL 2 MG/2ML IJ SOLN
INTRAMUSCULAR | Status: AC
  Filled 2016-01-04: qty 2

## 2016-01-04 MED ORDER — LACTATED RINGERS IV SOLN
INTRAVENOUS | Status: DC
  Administered 2016-01-04 (×2): via INTRAVENOUS

## 2016-01-04 MED ORDER — MIDAZOLAM HCL 2 MG/2ML IJ SOLN
1.0000 mg | INTRAMUSCULAR | Status: DC | PRN
  Administered 2016-01-04: 1 mg via INTRAVENOUS

## 2016-01-04 MED ORDER — LIDOCAINE HCL (CARDIAC) 20 MG/ML IV SOLN
INTRAVENOUS | Status: DC | PRN
  Administered 2016-01-04: 60 mg via INTRAVENOUS

## 2016-01-04 MED ORDER — FENTANYL CITRATE (PF) 100 MCG/2ML IJ SOLN: 25.0000 ug | INTRAMUSCULAR | Status: DC | PRN

## 2016-01-04 SURGICAL SUPPLY — 57 items
BANDAGE COBAN STERILE 2 (GAUZE/BANDAGES/DRESSINGS) IMPLANT
BIT DRILL 1.1 (BIT) ×2
BIT DRILL 60X20X1.1XQC TMX (BIT) IMPLANT
BIT DRL 60X20X1.1XQC TMX (BIT) ×1
BLADE MINI RND TIP GREEN BEAV (BLADE) IMPLANT
BLADE SURG 15 STRL LF DISP TIS (BLADE) ×1 IMPLANT
BLADE SURG 15 STRL SS (BLADE) ×2
BNDG CMPR 9X4 STRL LF SNTH (GAUZE/BANDAGES/DRESSINGS)
BNDG COHESIVE 1X5 TAN STRL LF (GAUZE/BANDAGES/DRESSINGS) IMPLANT
BNDG COHESIVE 4X5 TAN STRL (GAUZE/BANDAGES/DRESSINGS) ×1 IMPLANT
BNDG ESMARK 4X9 LF (GAUZE/BANDAGES/DRESSINGS) IMPLANT
BNDG GAUZE ELAST 4 BULKY (GAUZE/BANDAGES/DRESSINGS) ×3 IMPLANT
CHLORAPREP W/TINT 26ML (MISCELLANEOUS) ×2 IMPLANT
CORDS BIPOLAR (ELECTRODE) ×1 IMPLANT
COVER BACK TABLE 60X90IN (DRAPES) ×2 IMPLANT
COVER MAYO STAND STRL (DRAPES) ×2 IMPLANT
CUFF TOURNIQUET SINGLE 18IN (TOURNIQUET CUFF) ×1 IMPLANT
DRAPE C-ARM 42X72 X-RAY (DRAPES) ×2 IMPLANT
DRAPE EXTREMITY T 121X128X90 (DRAPE) ×2 IMPLANT
DRAPE SURG 17X23 STRL (DRAPES) ×2 IMPLANT
DRIVER BIT 1.5 (TRAUMA) ×2 IMPLANT
DRSG EMULSION OIL 3X3 NADH (GAUZE/BANDAGES/DRESSINGS) ×1 IMPLANT
GLOVE BIO SURGEON STRL SZ7.5 (GLOVE) ×2 IMPLANT
GLOVE BIOGEL PI IND STRL 7.0 (GLOVE) ×1 IMPLANT
GLOVE BIOGEL PI IND STRL 8 (GLOVE) ×1 IMPLANT
GLOVE BIOGEL PI INDICATOR 7.0 (GLOVE) ×2
GLOVE BIOGEL PI INDICATOR 8 (GLOVE) ×1
GLOVE ECLIPSE 6.5 STRL STRAW (GLOVE) ×3 IMPLANT
GOWN STRL REUS W/ TWL LRG LVL3 (GOWN DISPOSABLE) ×2 IMPLANT
GOWN STRL REUS W/TWL LRG LVL3 (GOWN DISPOSABLE) ×4
GOWN STRL REUS W/TWL XL LVL3 (GOWN DISPOSABLE) ×2 IMPLANT
GUIDEWIRE .45X5.910 (WIRE) ×2 IMPLANT
NEEDLE HYPO 22GX1.5 SAFETY (NEEDLE) ×1 IMPLANT
NS IRRIG 1000ML POUR BTL (IV SOLUTION) ×2 IMPLANT
PACK BASIN DAY SURGERY FS (CUSTOM PROCEDURE TRAY) ×2 IMPLANT
PADDING CAST ABS 4INX4YD NS (CAST SUPPLIES)
PADDING CAST ABS COTTON 4X4 ST (CAST SUPPLIES) IMPLANT
PADDING UNDERCAST 2 STRL (CAST SUPPLIES)
PADDING UNDERCAST 2X4 STRL (CAST SUPPLIES) IMPLANT
PLATE T SMALL 1.5MM (Plate) ×1 IMPLANT
RUBBERBAND STERILE (MISCELLANEOUS) IMPLANT
SCREW L 1.5X12 (Screw) ×2 IMPLANT
SCREW L 1.5X14 (Screw) ×1 IMPLANT
SCREW LOCKING 1.5X13MM (Screw) ×2 IMPLANT
SCREW LOCKING 1.5X16 (Screw) ×1 IMPLANT
SPLINT PLASTER CAST XFAST 4X15 (CAST SUPPLIES) IMPLANT
SPLINT PLASTER XTRA FAST SET 4 (CAST SUPPLIES) ×7
SPONGE GAUZE 4X4 12PLY STER LF (GAUZE/BANDAGES/DRESSINGS) ×2 IMPLANT
STOCKINETTE 6  STRL (DRAPES) ×1
STOCKINETTE 6 STRL (DRAPES) ×1 IMPLANT
SUT VICRYL RAPIDE 4-0 (SUTURE) IMPLANT
SUT VICRYL RAPIDE 4/0 PS 2 (SUTURE) ×1 IMPLANT
SYR BULB 3OZ (MISCELLANEOUS) ×1 IMPLANT
SYRINGE 10CC LL (SYRINGE) ×1 IMPLANT
TOWEL OR 17X24 6PK STRL BLUE (TOWEL DISPOSABLE) ×2 IMPLANT
TOWEL OR NON WOVEN STRL DISP B (DISPOSABLE) ×1 IMPLANT
UNDERPAD 30X30 (UNDERPADS AND DIAPERS) ×2 IMPLANT

## 2016-01-04 NOTE — Anesthesia Procedure Notes (Signed)
Procedure Name: Intubation Date/Time: 01/04/2016 4:34 PM Performed by: Maryella Shivers Pre-anesthesia Checklist: Patient identified, Emergency Drugs available, Suction available and Patient being monitored Patient Re-evaluated:Patient Re-evaluated prior to inductionOxygen Delivery Method: Circle system utilized Preoxygenation: Pre-oxygenation with 100% oxygen Intubation Type: IV induction Ventilation: Mask ventilation with difficulty Laryngoscope Size: Mac and 3 Grade View: Grade III Tube type: Oral Tube size: 7.0 mm Number of attempts: 1 Airway Equipment and Method: Stylet and Oral airway Placement Confirmation: ETT inserted through vocal cords under direct vision,  positive ETCO2 and breath sounds checked- equal and bilateral Secured at: 21 cm Tube secured with: Tape Dental Injury: Teeth and Oropharynx as per pre-operative assessment

## 2016-01-04 NOTE — Interval H&P Note (Signed)
History and Physical Interval Note:  01/04/2016 4:11 PM  [place] D Cashin  has presented today for surgery, with the diagnosis of RIGHT THUMB PROXIMAL PHALANX FRACTURE  The various methods of treatment have been discussed with the patient and family. After consideration of risks, benefits and other options for treatment, the patient has consented to  Procedure(s): PINNING VERSUS OPEN TREATMENT OF RIGHT THUMB PROXIMAL PHALANX FRACTURE (Right) as a surgical intervention .  The patient's history has been reviewed, patient examined, no change in status, stable for surgery.  I have reviewed the patient's chart and labs.  Questions were answered to the patient's satisfaction.     THOMPSON, DAVID A.

## 2016-01-04 NOTE — Anesthesia Postprocedure Evaluation (Signed)
Anesthesia Post Note  Patient: Diane [name]  Procedure(s) Performed: Procedure(s) (LRB): OPEN TREATMENT OF RIGHT THUMB PROXIMAL PHALANX FRACTURE (Right)  Patient location during evaluation: PACU Anesthesia Type: General Level of consciousness: awake, sedated, oriented and patient cooperative Pain management: pain level controlled Vital Signs Assessment: post-procedure vital signs reviewed and stable Respiratory status: spontaneous breathing and respiratory function stable Cardiovascular status: blood pressure returned to baseline and stable Anesthetic complications: no    Last Vitals:  Vitals:   01/04/16 1738 01/04/16 1745  BP: 135/69 121/61  Pulse: (!) 171 69  Resp: 19 (!) 24  Temp:      Last Pain:  Vitals:   01/04/16 1745  TempSrc:   PainSc: 0-No pain                 SMITH,GREGORY EDWARD

## 2016-01-04 NOTE — Discharge Instructions (Signed)
Discharge Instructions   You have a dressing with a plaster splint incorporated in it. Move your fingers as much as possible, making a full fist and fully opening the fist. Elevate your hand to reduce pain & swelling of the digits.  Ice over the operative site may be helpful to reduce pain & swelling.  DO NOT USE HEAT. Pain medicine has been prescribed for you.  Use your medicine as needed over the first 48 hours, and then you can begin to taper your use.  You may use Tylenol in place of your prescribed pain medication, but not IN ADDITION to it. Leave the dressing in place until you return to our office.  You may shower, but keep the bandage clean & dry.  You may drive a car when you are off of prescription pain medications and can safely control your vehicle with both hands. Our office will call to arrange a follow up appointment.     Post Anesthesia Home Care Instructions  Activity: Get plenty of rest for the remainder of the day. A responsible adult should stay with you for 24 hours following the procedure.  For the next 24 hours, DO NOT: -Drive a car -Paediatric nurse -Drink alcoholic beverages -Take any medication unless instructed by your physician -Make any legal decisions or sign important papers.  Meals: Start with liquid foods such as gelatin or soup. Progress to regular foods as tolerated. Avoid greasy, spicy, heavy foods. If nausea and/or vomiting occur, drink only clear liquids until the nausea and/or vomiting subsides. Call your physician if vomiting continues.  Special Instructions/Symptoms: Your throat may feel dry or sore from the anesthesia or the breathing tube placed in your throat during surgery. If this causes discomfort, gargle with warm salt water. The discomfort should disappear within 24 hours.  If you had a scopolamine patch placed behind your ear for the management of post- operative nausea and/or vomiting:  1. The medication in the patch is effective  for 72 hours, after which it should be removed.  Wrap patch in a tissue and discard in the trash. Wash hands thoroughly with soap and water. 2. You may remove the patch earlier than 72 hours if you experience unpleasant side effects which may include dry mouth, dizziness or visual disturbances. 3. Avoid touching the patch. Wash your hands with soap and water after contact with the patch.     Please call [phone] during normal business hours or [phone] after hours for any problems. Including the following:  - excessive redness of the incisions - drainage for more than 4 days - fever of more than 101.5 F  *Please note that pain medications will not be refilled after hours or on weekends.

## 2016-01-04 NOTE — Transfer of Care (Signed)
Immediate Anesthesia Transfer of Care Note  Patient: Diane [name]  Procedure(s) Performed: Procedure(s): OPEN TREATMENT OF RIGHT THUMB PROXIMAL PHALANX FRACTURE (Right)  Patient Location: PACU  Anesthesia Type:General  Level of Consciousness: sedated  Airway & Oxygen Therapy: Patient Spontanous Breathing and Patient connected to face mask oxygen  Post-op Assessment: Report given to RN and Post -op Vital signs reviewed and stable  Post vital signs: Reviewed and stable  Last Vitals:  Vitals:   01/04/16 1737 01/04/16 1738  BP:  135/69  Pulse: 63 (!) 171  Resp: (P) 18 19  Temp: (P) 36.4 C     Last Pain:  Vitals:   01/04/16 1432  TempSrc: Oral         Complications: No apparent anesthesia complications

## 2016-01-04 NOTE — Anesthesia Preprocedure Evaluation (Signed)
Anesthesia Evaluation  Patient identified by MRN, date of birth, ID band Patient awake    Reviewed: Allergy & Precautions, H&P , NPO status , Patient's Chart, lab work & pertinent test results  Airway Mallampati: III  TM Distance: >3 FB Neck ROM: Full    Dental no notable dental hx. (+) Teeth Intact, Dental Advisory Given   Pulmonary neg pulmonary ROS, former smoker,    Pulmonary exam normal breath sounds clear to auscultation       Cardiovascular hypertension, Pt. on medications + CAD  + dysrhythmias Atrial Fibrillation  Rhythm:Irregular Rate:Normal     Neuro/Psych  Headaches, Anxiety Depression    GI/Hepatic negative GI ROS, Neg liver ROS,   Endo/Other  diabetes, Type 2, Oral Hypoglycemic Agents  Renal/GU negative Renal ROS  negative genitourinary   Musculoskeletal  (+) Arthritis , Osteoarthritis,  Fibromyalgia -  Abdominal   Peds  Hematology negative hematology ROS (+) anemia ,   Anesthesia Other Findings   Reproductive/Obstetrics negative OB ROS                             Anesthesia Physical Anesthesia Plan  ASA: III  Anesthesia Plan: General   Post-op Pain Management:    Induction: Intravenous  Airway Management Planned: LMA  Additional Equipment:   Intra-op Plan:   Post-operative Plan: Extubation in OR  Informed Consent: I have reviewed the patients History and Physical, chart, labs and discussed the procedure including the risks, benefits and alternatives for the proposed anesthesia with the patient or authorized representative who has indicated his/her understanding and acceptance.   Dental advisory given  Plan Discussed with: CRNA  Anesthesia Plan Comments:         Anesthesia Quick Evaluation

## 2016-01-04 NOTE — Op Note (Signed)
01/04/2016  4:11 PM  PATIENT:  Diane [name]  75 y.o. female  PRE-OPERATIVE DIAGNOSIS:  Displaced right thumb proximal phalangeal fracture  POST-OPERATIVE DIAGNOSIS:  Same  PROCEDURE:  ORIF right thumb proximal phalangeal fracture  SURGEON: Rayvon Char. Grandville Silos, MD  PHYSICIAN ASSISTANT: Morley Kos, OPA-C  ANESTHESIA:  general  SPECIMENS:  None  DRAINS:   None  EBL:  less than 50 mL  PREOPERATIVE INDICATIONS:  Diane D Wardrip is a  75 y.o. female with displaced right thumb proximal phalangeal fracture  The risks benefits and alternatives were discussed with the patient preoperatively including but not limited to the risks of infection, bleeding, nerve injury, cardiopulmonary complications, the need for revision surgery, among others, and the patient verbalized understanding and consented to proceed.  OPERATIVE IMPLANTS: Biomet ALPS hand fracture T plate/screws  OPERATIVE PROCEDURE:  After receiving prophylactic antibiotics, the patient was escorted to the operative theatre and placed in a supine position.  A surgical "time-out" was performed during which the planned procedure, proposed operative site, and the correct patient identity were compared to the operative consent and agreement confirmed by the circulating nurse according to current facility policy.  Following application of a tourniquet to the operative extremity, the exposed skin was prepped with Chloraprep and draped in the usual sterile fashion.  The limb was exsanguinated with an Esmarch bandage and the tourniquet inflated to approximately 147mmHg higher than systolic BP.  The fracture was manipulated under fluoroscopy and reduction was found to be unacceptable. Decision was made to proceed with open treatment. A linear longitudinal incision was made over the mid axial line of the thumb exposing the proximal phalanx. Cutaneous nerves were reflected in the flaps. The extensor tendon was retracted dorsally and the  periosteum was split on the radial mid axial line. Subperiosteal dissection was carried around the phalanx volarly and dorsally exposing the fracture. The fracture was provisionally reduced. This was a bit tricky, and ultimately however was done with a T plate in place. The T plate was bent so that the T was proximally directed and the tabs or volar and dorsal at the base of the proximal phalanx. With the plate applied in this manner and the reduction judged to be near-anatomic, the holes were sequentially drilled and filled with locking screws. The proximal volar tab from the plate was left without a screw in place. Reduction was found to be near-anatomic a final fluoroscopic images were obtained. The wound is copiously irrigated and the periosteum reapproximated with 4-0 Vicryl Rapide interrupted sutures. Some additional hemostasis was obtained after the tourniquet was released and the wound irrigated. Percent Marcaine with epinephrine was instilled at the base of the digit for postoperative pain control. The skin was closed with running 4-0 Vicryl Rapide sutures and a forearm-based thumb spica splint dressing was applied.  She was awakened and taken to the recovery room in stable condition, breathing spontaneously.  DISPOSITION: She will be discharged home today with typical instructions, return to therapy in several days to have a splint constructed and begin rehabilitation. RTC with me 10-15 days with new x-rays of the right thumb out of splint.            Lennette Bihari Kuzm[[id number];]a, fax........., cell....[name] KitchenMarland Kitchen

## 2016-01-06 ENCOUNTER — Encounter (HOSPITAL_BASED_OUTPATIENT_CLINIC_OR_DEPARTMENT_OTHER): Payer: Self-pay | Admitting: Orthopedic Surgery

## 2016-01-10 DIAGNOSIS — Z8544 Personal history of malignant neoplasm of other female genital organs: Secondary | ICD-10-CM | POA: Diagnosis not present

## 2016-01-10 DIAGNOSIS — Z9189 Other specified personal risk factors, not elsewhere classified: Secondary | ICD-10-CM | POA: Diagnosis not present

## 2016-01-12 DIAGNOSIS — S62514D Nondisplaced fracture of proximal phalanx of right thumb, subsequent encounter for fracture with routine healing: Secondary | ICD-10-CM | POA: Diagnosis not present

## 2016-01-17 DIAGNOSIS — S62514D Nondisplaced fracture of proximal phalanx of right thumb, subsequent encounter for fracture with routine healing: Secondary | ICD-10-CM | POA: Diagnosis not present

## 2016-01-17 DIAGNOSIS — S62511D Displaced fracture of proximal phalanx of right thumb, subsequent encounter for fracture with routine healing: Secondary | ICD-10-CM | POA: Diagnosis not present

## 2016-01-26 DIAGNOSIS — S62514D Nondisplaced fracture of proximal phalanx of right thumb, subsequent encounter for fracture with routine healing: Secondary | ICD-10-CM | POA: Diagnosis not present

## 2016-02-01 DIAGNOSIS — S62514D Nondisplaced fracture of proximal phalanx of right thumb, subsequent encounter for fracture with routine healing: Secondary | ICD-10-CM | POA: Diagnosis not present

## 2016-02-03 DIAGNOSIS — H3509 Other intraretinal microvascular abnormalities: Secondary | ICD-10-CM | POA: Diagnosis not present

## 2016-02-03 DIAGNOSIS — E119 Type 2 diabetes mellitus without complications: Secondary | ICD-10-CM | POA: Diagnosis not present

## 2016-02-03 DIAGNOSIS — H43391 Other vitreous opacities, right eye: Secondary | ICD-10-CM | POA: Diagnosis not present

## 2016-02-03 DIAGNOSIS — S62514D Nondisplaced fracture of proximal phalanx of right thumb, subsequent encounter for fracture with routine healing: Secondary | ICD-10-CM | POA: Diagnosis not present

## 2016-02-07 DIAGNOSIS — E785 Hyperlipidemia, unspecified: Secondary | ICD-10-CM | POA: Diagnosis not present

## 2016-02-07 DIAGNOSIS — E119 Type 2 diabetes mellitus without complications: Secondary | ICD-10-CM | POA: Diagnosis not present

## 2016-02-07 DIAGNOSIS — I10 Essential (primary) hypertension: Secondary | ICD-10-CM | POA: Diagnosis not present

## 2016-02-08 DIAGNOSIS — S62514D Nondisplaced fracture of proximal phalanx of right thumb, subsequent encounter for fracture with routine healing: Secondary | ICD-10-CM | POA: Diagnosis not present

## 2016-02-11 DIAGNOSIS — S62514D Nondisplaced fracture of proximal phalanx of right thumb, subsequent encounter for fracture with routine healing: Secondary | ICD-10-CM | POA: Diagnosis not present

## 2016-02-15 DIAGNOSIS — S62514D Nondisplaced fracture of proximal phalanx of right thumb, subsequent encounter for fracture with routine healing: Secondary | ICD-10-CM | POA: Diagnosis not present

## 2016-02-16 DIAGNOSIS — S62511D Displaced fracture of proximal phalanx of right thumb, subsequent encounter for fracture with routine healing: Secondary | ICD-10-CM | POA: Diagnosis not present

## 2016-02-17 DIAGNOSIS — S62514D Nondisplaced fracture of proximal phalanx of right thumb, subsequent encounter for fracture with routine healing: Secondary | ICD-10-CM | POA: Diagnosis not present

## 2016-02-22 DIAGNOSIS — S62514D Nondisplaced fracture of proximal phalanx of right thumb, subsequent encounter for fracture with routine healing: Secondary | ICD-10-CM | POA: Diagnosis not present

## 2016-02-24 DIAGNOSIS — S62514D Nondisplaced fracture of proximal phalanx of right thumb, subsequent encounter for fracture with routine healing: Secondary | ICD-10-CM | POA: Diagnosis not present

## 2016-02-25 DIAGNOSIS — E119 Type 2 diabetes mellitus without complications: Secondary | ICD-10-CM | POA: Diagnosis not present

## 2016-02-25 DIAGNOSIS — M1990 Unspecified osteoarthritis, unspecified site: Secondary | ICD-10-CM | POA: Diagnosis not present

## 2016-02-25 DIAGNOSIS — I482 Chronic atrial fibrillation: Secondary | ICD-10-CM | POA: Diagnosis not present

## 2016-02-25 DIAGNOSIS — E785 Hyperlipidemia, unspecified: Secondary | ICD-10-CM | POA: Diagnosis not present

## 2016-02-25 DIAGNOSIS — I10 Essential (primary) hypertension: Secondary | ICD-10-CM | POA: Diagnosis not present

## 2016-02-25 DIAGNOSIS — I639 Cerebral infarction, unspecified: Secondary | ICD-10-CM | POA: Diagnosis not present

## 2016-02-25 DIAGNOSIS — I340 Nonrheumatic mitral (valve) insufficiency: Secondary | ICD-10-CM | POA: Diagnosis not present

## 2016-02-25 DIAGNOSIS — I351 Nonrheumatic aortic (valve) insufficiency: Secondary | ICD-10-CM | POA: Diagnosis not present

## 2016-02-25 DIAGNOSIS — E669 Obesity, unspecified: Secondary | ICD-10-CM | POA: Diagnosis not present

## 2016-02-29 DIAGNOSIS — S62514D Nondisplaced fracture of proximal phalanx of right thumb, subsequent encounter for fracture with routine healing: Secondary | ICD-10-CM | POA: Diagnosis not present

## 2016-03-02 DIAGNOSIS — S62514D Nondisplaced fracture of proximal phalanx of right thumb, subsequent encounter for fracture with routine healing: Secondary | ICD-10-CM | POA: Diagnosis not present

## 2016-03-07 DIAGNOSIS — S62514D Nondisplaced fracture of proximal phalanx of right thumb, subsequent encounter for fracture with routine healing: Secondary | ICD-10-CM | POA: Diagnosis not present

## 2016-03-09 DIAGNOSIS — S62514D Nondisplaced fracture of proximal phalanx of right thumb, subsequent encounter for fracture with routine healing: Secondary | ICD-10-CM | POA: Diagnosis not present

## 2016-03-14 ENCOUNTER — Encounter: Payer: Self-pay | Admitting: Podiatry

## 2016-03-14 ENCOUNTER — Ambulatory Visit (INDEPENDENT_AMBULATORY_CARE_PROVIDER_SITE_OTHER): Payer: Medicare Other | Admitting: Podiatry

## 2016-03-14 VITALS — BP 135/71 | HR 45 | Resp 14

## 2016-03-14 DIAGNOSIS — M2011 Hallux valgus (acquired), right foot: Secondary | ICD-10-CM

## 2016-03-14 DIAGNOSIS — B351 Tinea unguium: Secondary | ICD-10-CM

## 2016-03-14 DIAGNOSIS — M7752 Other enthesopathy of left foot: Secondary | ICD-10-CM

## 2016-03-14 DIAGNOSIS — S62514D Nondisplaced fracture of proximal phalanx of right thumb, subsequent encounter for fracture with routine healing: Secondary | ICD-10-CM | POA: Diagnosis not present

## 2016-03-14 DIAGNOSIS — M79676 Pain in unspecified toe(s): Secondary | ICD-10-CM

## 2016-03-14 NOTE — Progress Notes (Signed)
Patient ID: Diane [name], female   DOB: 09/14/1940, 75 y.o.   MRN: [id number]   Complaint:  Visit Type: Patient returns to my office for continued preventative foot care services. Complaint: Patient states" my nails have grown long and thick and become painful to walk and wear shoes" Patient has been diagnosed with DM with neuropathy.. The patient presents for preventative foot care services. No changes to ROS.  She was concerned about her diabetic shoes.  Melody is absent today.  Podiatric Exam: Vascular: dorsalis pedis and posterior tibial pulses are palpable bilateral. Capillary return is immediate. Temperature gradient is WNL. Skin turgor WNL  Sensorium: Diminished  Semmes Weinstein monofilament test. Normal tactile sensation bilaterally. Nail Exam: Pt has thick disfigured discolored nails with subungual debris noted bilateral entire nail hallux through fifth toenails Ulcer Exam: There is no evidence of ulcer or pre-ulcerative changes or infection. Orthopedic Exam: Muscle tone and strength are WNL. No limitations in general ROM. No crepitus or effusions noted. Foot type and digits show no abnormalities.HAV 1st MPJ B/L. Skin: No Porokeratosis. No infection or ulcers  Diagnosis:  Onychomycosis, , Pain in right toe, pain in left toes  Treatment & Plan Procedures and Treatment: Consent by patient was obtained for treatment procedures. The patient understood the discussion of treatment and procedures well. All questions were answered thoroughly reviewed. Debridement of mycotic and hypertrophic toenails, 1 through 5 bilateral and clearing of subungual debris. No ulceration, no infection noted.  Dispense compression sock for left ankle.   Return Visit-Office Procedure: Patient instructed to return to the office for a follow up visit 3 months for continued evaluation and treatment.   Gardiner Barefoot DPM

## 2016-03-15 DIAGNOSIS — S62514D Nondisplaced fracture of proximal phalanx of right thumb, subsequent encounter for fracture with routine healing: Secondary | ICD-10-CM | POA: Diagnosis not present

## 2016-03-21 ENCOUNTER — Ambulatory Visit (INDEPENDENT_AMBULATORY_CARE_PROVIDER_SITE_OTHER): Payer: Medicare Other | Admitting: *Deleted

## 2016-03-21 DIAGNOSIS — S62514D Nondisplaced fracture of proximal phalanx of right thumb, subsequent encounter for fracture with routine healing: Secondary | ICD-10-CM | POA: Diagnosis not present

## 2016-03-21 DIAGNOSIS — E1140 Type 2 diabetes mellitus with diabetic neuropathy, unspecified: Secondary | ICD-10-CM

## 2016-03-22 DIAGNOSIS — Z23 Encounter for immunization: Secondary | ICD-10-CM | POA: Diagnosis not present

## 2016-03-23 DIAGNOSIS — S62514D Nondisplaced fracture of proximal phalanx of right thumb, subsequent encounter for fracture with routine healing: Secondary | ICD-10-CM | POA: Diagnosis not present

## 2016-03-28 DIAGNOSIS — S62514D Nondisplaced fracture of proximal phalanx of right thumb, subsequent encounter for fracture with routine healing: Secondary | ICD-10-CM | POA: Diagnosis not present

## 2016-03-30 DIAGNOSIS — S62514D Nondisplaced fracture of proximal phalanx of right thumb, subsequent encounter for fracture with routine healing: Secondary | ICD-10-CM | POA: Diagnosis not present

## 2016-04-04 DIAGNOSIS — L659 Nonscarring hair loss, unspecified: Secondary | ICD-10-CM | POA: Diagnosis not present

## 2016-04-04 DIAGNOSIS — S62514D Nondisplaced fracture of proximal phalanx of right thumb, subsequent encounter for fracture with routine healing: Secondary | ICD-10-CM | POA: Diagnosis not present

## 2016-04-06 DIAGNOSIS — S62514D Nondisplaced fracture of proximal phalanx of right thumb, subsequent encounter for fracture with routine healing: Secondary | ICD-10-CM | POA: Diagnosis not present

## 2016-04-11 DIAGNOSIS — S62514D Nondisplaced fracture of proximal phalanx of right thumb, subsequent encounter for fracture with routine healing: Secondary | ICD-10-CM | POA: Diagnosis not present

## 2016-04-13 DIAGNOSIS — S62514D Nondisplaced fracture of proximal phalanx of right thumb, subsequent encounter for fracture with routine healing: Secondary | ICD-10-CM | POA: Diagnosis not present

## 2016-04-18 DIAGNOSIS — S62514D Nondisplaced fracture of proximal phalanx of right thumb, subsequent encounter for fracture with routine healing: Secondary | ICD-10-CM | POA: Diagnosis not present

## 2016-04-25 DIAGNOSIS — E119 Type 2 diabetes mellitus without complications: Secondary | ICD-10-CM | POA: Diagnosis not present

## 2016-04-25 DIAGNOSIS — I480 Paroxysmal atrial fibrillation: Secondary | ICD-10-CM | POA: Diagnosis not present

## 2016-04-25 DIAGNOSIS — Z1389 Encounter for screening for other disorder: Secondary | ICD-10-CM | POA: Diagnosis not present

## 2016-04-25 DIAGNOSIS — E1165 Type 2 diabetes mellitus with hyperglycemia: Secondary | ICD-10-CM | POA: Diagnosis not present

## 2016-04-25 DIAGNOSIS — R0609 Other forms of dyspnea: Secondary | ICD-10-CM | POA: Diagnosis not present

## 2016-04-25 DIAGNOSIS — I10 Essential (primary) hypertension: Secondary | ICD-10-CM | POA: Diagnosis not present

## 2016-04-25 DIAGNOSIS — Z79899 Other long term (current) drug therapy: Secondary | ICD-10-CM | POA: Diagnosis not present

## 2016-04-25 DIAGNOSIS — Z0000 Encounter for general adult medical examination without abnormal findings: Secondary | ICD-10-CM | POA: Diagnosis not present

## 2016-04-25 DIAGNOSIS — Z7984 Long term (current) use of oral hypoglycemic drugs: Secondary | ICD-10-CM | POA: Diagnosis not present

## 2016-04-26 DIAGNOSIS — S62511D Displaced fracture of proximal phalanx of right thumb, subsequent encounter for fracture with routine healing: Secondary | ICD-10-CM | POA: Diagnosis not present

## 2016-04-26 DIAGNOSIS — M25649 Stiffness of unspecified hand, not elsewhere classified: Secondary | ICD-10-CM | POA: Diagnosis not present

## 2016-04-26 DIAGNOSIS — Z7984 Long term (current) use of oral hypoglycemic drugs: Secondary | ICD-10-CM | POA: Diagnosis not present

## 2016-04-26 DIAGNOSIS — E119 Type 2 diabetes mellitus without complications: Secondary | ICD-10-CM | POA: Diagnosis not present

## 2016-05-02 DIAGNOSIS — S62514D Nondisplaced fracture of proximal phalanx of right thumb, subsequent encounter for fracture with routine healing: Secondary | ICD-10-CM | POA: Diagnosis not present

## 2016-05-09 ENCOUNTER — Ambulatory Visit (INDEPENDENT_AMBULATORY_CARE_PROVIDER_SITE_OTHER): Payer: Medicare Other | Admitting: Podiatry

## 2016-05-09 DIAGNOSIS — E1140 Type 2 diabetes mellitus with diabetic neuropathy, unspecified: Secondary | ICD-10-CM | POA: Diagnosis not present

## 2016-05-09 DIAGNOSIS — M2011 Hallux valgus (acquired), right foot: Secondary | ICD-10-CM | POA: Diagnosis not present

## 2016-05-09 DIAGNOSIS — M2012 Hallux valgus (acquired), left foot: Secondary | ICD-10-CM | POA: Diagnosis not present

## 2016-05-09 NOTE — Progress Notes (Signed)
Patient ID: Diane [name], female   DOB: 1941/04/19, 75 y.o.   MRN: [id number]   Complaint:  Visit Type: Patient returns to my office for pick up of her new diabetic shoes.  Podiatric Exam: Vascular: dorsalis pedis and posterior tibial pulses are palpable bilateral. Capillary return is immediate. Temperature gradient is WNL. Skin turgor WNL  Sensorium: Diminished  Semmes Weinstein monofilament test. Normal tactile sensation bilaterally. Nail Exam: Pt has thick disfigured discolored nails with subungual debris noted bilateral entire nail hallux through fifth toenails Ulcer Exam: There is no evidence of ulcer or pre-ulcerative changes or infection. Orthopedic Exam: Muscle tone and strength are WNL. No limitations in general ROM. No crepitus or effusions noted. Foot type and digits show no abnormalities.HAV 1st MPJ B/L. Skin: No Porokeratosis. No infection or ulcers  Diagnosis  Diabetic neuropathy  HAV  B/L  Treatment & Plan Procedures and Treatment: Diabetic shoes were dispensed.  Patient presents today and was dispensed 0ne pair ( two units) of medically necessary extra depth shoes with three pair( six units) of custom molded multiple density inserts. The shoes and the inserts are fitted to the patients ' feet and are noted to fit well and are free of defect.  Length and width of the shoes are also acceptable.  Patient was given written and verbal  instructions for wearing.  If any concerns arrive with the shoes or inserts, the patient is to call the office.Patient is to follow up with doctor in six weeks.  Return Visit-Office Procedure: Patient instructed to return to the office for a follow up  For preventive foot care services.   Gardiner Barefoot DPM

## 2016-05-16 DIAGNOSIS — S62514D Nondisplaced fracture of proximal phalanx of right thumb, subsequent encounter for fracture with routine healing: Secondary | ICD-10-CM | POA: Diagnosis not present

## 2016-06-02 ENCOUNTER — Other Ambulatory Visit: Payer: Self-pay | Admitting: Cardiology

## 2016-06-02 DIAGNOSIS — I480 Paroxysmal atrial fibrillation: Secondary | ICD-10-CM | POA: Diagnosis not present

## 2016-06-02 DIAGNOSIS — I639 Cerebral infarction, unspecified: Secondary | ICD-10-CM | POA: Diagnosis not present

## 2016-06-02 DIAGNOSIS — I10 Essential (primary) hypertension: Secondary | ICD-10-CM | POA: Diagnosis not present

## 2016-06-02 DIAGNOSIS — I351 Nonrheumatic aortic (valve) insufficiency: Secondary | ICD-10-CM | POA: Diagnosis not present

## 2016-06-02 DIAGNOSIS — M1990 Unspecified osteoarthritis, unspecified site: Secondary | ICD-10-CM | POA: Diagnosis not present

## 2016-06-02 DIAGNOSIS — E119 Type 2 diabetes mellitus without complications: Secondary | ICD-10-CM | POA: Diagnosis not present

## 2016-06-02 DIAGNOSIS — I340 Nonrheumatic mitral (valve) insufficiency: Secondary | ICD-10-CM | POA: Diagnosis not present

## 2016-06-02 DIAGNOSIS — I25119 Atherosclerotic heart disease of native coronary artery with unspecified angina pectoris: Secondary | ICD-10-CM | POA: Diagnosis not present

## 2016-06-02 DIAGNOSIS — E785 Hyperlipidemia, unspecified: Secondary | ICD-10-CM | POA: Diagnosis not present

## 2016-06-02 DIAGNOSIS — R079 Chest pain, unspecified: Secondary | ICD-10-CM

## 2016-06-06 DIAGNOSIS — S62514D Nondisplaced fracture of proximal phalanx of right thumb, subsequent encounter for fracture with routine healing: Secondary | ICD-10-CM | POA: Diagnosis not present

## 2016-06-12 ENCOUNTER — Ambulatory Visit (HOSPITAL_COMMUNITY)
Admission: RE | Admit: 2016-06-12 | Discharge: 2016-06-12 | Disposition: A | Discharge status: Home / Self Care | Payer: Medicare Other | Source: Ambulatory Visit | Attending: Cardiology | Admitting: Cardiology

## 2016-06-12 DIAGNOSIS — I340 Nonrheumatic mitral (valve) insufficiency: Secondary | ICD-10-CM | POA: Diagnosis not present

## 2016-06-12 DIAGNOSIS — I639 Cerebral infarction, unspecified: Secondary | ICD-10-CM | POA: Diagnosis not present

## 2016-06-12 DIAGNOSIS — R079 Chest pain, unspecified: Secondary | ICD-10-CM | POA: Diagnosis not present

## 2016-06-12 DIAGNOSIS — I351 Nonrheumatic aortic (valve) insufficiency: Secondary | ICD-10-CM | POA: Diagnosis not present

## 2016-06-12 DIAGNOSIS — I480 Paroxysmal atrial fibrillation: Secondary | ICD-10-CM | POA: Diagnosis not present

## 2016-06-12 DIAGNOSIS — I10 Essential (primary) hypertension: Secondary | ICD-10-CM | POA: Diagnosis not present

## 2016-06-12 DIAGNOSIS — E119 Type 2 diabetes mellitus without complications: Secondary | ICD-10-CM | POA: Diagnosis not present

## 2016-06-12 DIAGNOSIS — I25119 Atherosclerotic heart disease of native coronary artery with unspecified angina pectoris: Secondary | ICD-10-CM | POA: Diagnosis not present

## 2016-06-12 MED ORDER — TECHNETIUM TC 99M TETROFOSMIN IV KIT
30.0000 | PACK | Freq: Once | INTRAVENOUS | Status: AC | PRN
  Administered 2016-06-12: 30 via INTRAVENOUS

## 2016-06-12 MED ORDER — REGADENOSON 0.4 MG/5ML IV SOLN
INTRAVENOUS | Status: AC
  Administered 2016-06-12: 0.4 mg via INTRAVENOUS
  Filled 2016-06-12: qty 5

## 2016-06-12 MED ORDER — TECHNETIUM TC 99M TETROFOSMIN IV KIT
10.0000 | PACK | Freq: Once | INTRAVENOUS | Status: AC | PRN
  Administered 2016-06-12: 10 via INTRAVENOUS

## 2016-06-12 MED ORDER — REGADENOSON 0.4 MG/5ML IV SOLN
0.4000 mg | Freq: Once | INTRAVENOUS | Status: AC
  Administered 2016-06-12: 0.4 mg via INTRAVENOUS

## 2016-06-13 ENCOUNTER — Encounter: Payer: Self-pay | Admitting: Podiatry

## 2016-06-13 ENCOUNTER — Ambulatory Visit (INDEPENDENT_AMBULATORY_CARE_PROVIDER_SITE_OTHER): Payer: Medicare Other | Admitting: Podiatry

## 2016-06-13 VITALS — Ht 67.5 in | Wt 198.0 lb

## 2016-06-13 DIAGNOSIS — B351 Tinea unguium: Secondary | ICD-10-CM

## 2016-06-13 DIAGNOSIS — E1140 Type 2 diabetes mellitus with diabetic neuropathy, unspecified: Secondary | ICD-10-CM

## 2016-06-13 DIAGNOSIS — M79676 Pain in unspecified toe(s): Secondary | ICD-10-CM

## 2016-06-13 NOTE — Progress Notes (Signed)
Patient ID: Diane [name], female   DOB: [birth year], 77 y.o.   MRN: [id number]   Complaint:  Visit Type: Patient returns to my office for continued preventative foot care services. Complaint: Patient states" my nails have grown long and thick and become painful to walk and wear shoes" Patient has been diagnosed with DM with neuropathy.. The patient presents for preventative foot care services. No changes to ROS.    Podiatric Exam: Vascular: dorsalis pedis and posterior tibial pulses are palpable bilateral. Capillary return is immediate. Temperature gradient is WNL. Skin turgor WNL  Sensorium: Diminished  Semmes Weinstein monofilament test. Normal tactile sensation bilaterally. Nail Exam: Pt has thick disfigured discolored nails with subungual debris noted bilateral entire nail hallux through fifth toenails Ulcer Exam: There is no evidence of ulcer or pre-ulcerative changes or infection. Orthopedic Exam: Muscle tone and strength are WNL. No limitations in general ROM. No crepitus or effusions noted. Foot type and digits show no abnormalities.HAV 1st MPJ B/L. Skin: No Porokeratosis. No infection or ulcers  Diagnosis:  Onychomycosis, , Pain in right toe, pain in left toes  Treatment & Plan Procedures and Treatment: Consent by patient was obtained for treatment procedures. The patient understood the discussion of treatment and procedures well. All questions were answered thoroughly reviewed. Debridement of mycotic and hypertrophic toenails, 1 through 5 bilateral and clearing of subungual debris. No ulceration, no infection noted.  .   Return Visit-Office Procedure: Patient instructed to return to the office for a follow up visit 3 months for continued evaluation and treatment.   Gardiner Barefoot DPM

## 2016-06-14 ENCOUNTER — Other Ambulatory Visit: Payer: Self-pay | Admitting: Nurse Practitioner

## 2016-06-19 DIAGNOSIS — M25552 Pain in left hip: Secondary | ICD-10-CM | POA: Diagnosis not present

## 2016-06-19 DIAGNOSIS — M5416 Radiculopathy, lumbar region: Secondary | ICD-10-CM | POA: Diagnosis not present

## 2016-06-26 DIAGNOSIS — M545 Low back pain: Secondary | ICD-10-CM | POA: Diagnosis not present

## 2016-07-07 DIAGNOSIS — M48062 Spinal stenosis, lumbar region with neurogenic claudication: Secondary | ICD-10-CM | POA: Diagnosis not present

## 2016-07-21 ENCOUNTER — Other Ambulatory Visit: Payer: Self-pay | Admitting: General Surgery

## 2016-07-21 DIAGNOSIS — Z853 Personal history of malignant neoplasm of breast: Secondary | ICD-10-CM

## 2016-07-25 DIAGNOSIS — M48062 Spinal stenosis, lumbar region with neurogenic claudication: Secondary | ICD-10-CM | POA: Diagnosis not present

## 2016-08-30 DIAGNOSIS — E119 Type 2 diabetes mellitus without complications: Secondary | ICD-10-CM | POA: Diagnosis not present

## 2016-08-30 DIAGNOSIS — I351 Nonrheumatic aortic (valve) insufficiency: Secondary | ICD-10-CM | POA: Diagnosis not present

## 2016-08-30 DIAGNOSIS — I10 Essential (primary) hypertension: Secondary | ICD-10-CM | POA: Diagnosis not present

## 2016-08-30 DIAGNOSIS — I340 Nonrheumatic mitral (valve) insufficiency: Secondary | ICD-10-CM | POA: Diagnosis not present

## 2016-08-30 DIAGNOSIS — E785 Hyperlipidemia, unspecified: Secondary | ICD-10-CM | POA: Diagnosis not present

## 2016-08-30 DIAGNOSIS — E669 Obesity, unspecified: Secondary | ICD-10-CM | POA: Diagnosis not present

## 2016-08-30 DIAGNOSIS — I639 Cerebral infarction, unspecified: Secondary | ICD-10-CM | POA: Diagnosis not present

## 2016-08-30 DIAGNOSIS — I482 Chronic atrial fibrillation: Secondary | ICD-10-CM | POA: Diagnosis not present

## 2016-08-30 DIAGNOSIS — M1990 Unspecified osteoarthritis, unspecified site: Secondary | ICD-10-CM | POA: Diagnosis not present

## 2016-09-04 ENCOUNTER — Ambulatory Visit
Admission: RE | Admit: 2016-09-04 | Discharge: 2016-09-04 | Disposition: A | Discharge status: Home / Self Care | Payer: Medicare Other | Source: Ambulatory Visit | Attending: General Surgery | Admitting: General Surgery

## 2016-09-04 DIAGNOSIS — Z853 Personal history of malignant neoplasm of breast: Secondary | ICD-10-CM

## 2016-09-04 DIAGNOSIS — R928 Other abnormal and inconclusive findings on diagnostic imaging of breast: Secondary | ICD-10-CM | POA: Diagnosis not present

## 2016-09-07 ENCOUNTER — Emergency Department (HOSPITAL_COMMUNITY)
Admission: EM | Admit: 2016-09-07 | Discharge: 2016-09-07 | Disposition: A | Discharge status: Home / Self Care | Payer: Medicare Other | Attending: Emergency Medicine | Admitting: Emergency Medicine

## 2016-09-07 ENCOUNTER — Ambulatory Visit (HOSPITAL_COMMUNITY)
Admission: EM | Admit: 2016-09-07 | Discharge: 2016-09-07 | Disposition: A | Discharge status: Home / Self Care | Payer: Medicare Other

## 2016-09-07 ENCOUNTER — Encounter (HOSPITAL_COMMUNITY): Payer: Self-pay

## 2016-09-07 ENCOUNTER — Emergency Department (HOSPITAL_COMMUNITY): Payer: Medicare Other

## 2016-09-07 DIAGNOSIS — E119 Type 2 diabetes mellitus without complications: Secondary | ICD-10-CM | POA: Diagnosis not present

## 2016-09-07 DIAGNOSIS — Z8544 Personal history of malignant neoplasm of other female genital organs: Secondary | ICD-10-CM | POA: Diagnosis not present

## 2016-09-07 DIAGNOSIS — I2510 Atherosclerotic heart disease of native coronary artery without angina pectoris: Secondary | ICD-10-CM | POA: Insufficient documentation

## 2016-09-07 DIAGNOSIS — I10 Essential (primary) hypertension: Secondary | ICD-10-CM | POA: Diagnosis not present

## 2016-09-07 DIAGNOSIS — Z79899 Other long term (current) drug therapy: Secondary | ICD-10-CM | POA: Insufficient documentation

## 2016-09-07 DIAGNOSIS — Z853 Personal history of malignant neoplasm of breast: Secondary | ICD-10-CM | POA: Insufficient documentation

## 2016-09-07 DIAGNOSIS — Z87891 Personal history of nicotine dependence: Secondary | ICD-10-CM | POA: Diagnosis not present

## 2016-09-07 DIAGNOSIS — Z7984 Long term (current) use of oral hypoglycemic drugs: Secondary | ICD-10-CM | POA: Diagnosis not present

## 2016-09-07 DIAGNOSIS — R079 Chest pain, unspecified: Secondary | ICD-10-CM | POA: Diagnosis present

## 2016-09-07 LAB — CBC
HCT: 36.3 % (ref 36.0–46.0)
Hemoglobin: 11.9 g/dL — ABNORMAL LOW (ref 12.0–15.0)
MCH: 27.1 pg (ref 26.0–34.0)
MCHC: 32.8 g/dL (ref 30.0–36.0)
MCV: 82.7 fL (ref 78.0–100.0)
Platelets: 223 10*3/uL (ref 150–400)
RBC: 4.39 MIL/uL (ref 3.87–5.11)
RDW: 15.8 % — ABNORMAL HIGH (ref 11.5–15.5)
WBC: 5.3 10*3/uL (ref 4.0–10.5)

## 2016-09-07 LAB — BASIC METABOLIC PANEL
Anion gap: 10 (ref 5–15)
BUN: 23 mg/dL — ABNORMAL HIGH (ref 6–20)
CO2: 25 mmol/L (ref 22–32)
Calcium: 8.6 mg/dL — ABNORMAL LOW (ref 8.9–10.3)
Chloride: 103 mmol/L (ref 101–111)
Creatinine, Ser: 1.36 mg/dL — ABNORMAL HIGH (ref 0.44–1.00)
GFR calc Af Amer: 44 mL/min — ABNORMAL LOW (ref >60)
GFR calc non Af Amer: 38 mL/min — ABNORMAL LOW (ref >60)
Glucose, Bld: 122 mg/dL — ABNORMAL HIGH (ref 65–99)
Potassium: 3.8 mmol/L (ref 3.5–5.1)
Sodium: 138 mmol/L (ref 135–145)

## 2016-09-07 LAB — I-STAT TROPONIN, ED: Troponin i, poc: 0 ng/mL (ref 0.00–0.08)

## 2016-09-07 NOTE — ED Notes (Signed)
Patient Alert and oriented X4. Stable and ambulatory. Patient verbalized understanding of the discharge instructions.  Patient belongings were taken by the patient.  

## 2016-09-07 NOTE — Discharge Instructions (Signed)
Test showed no life-threatening condition. Follow-up your cardiologist or return if worse.

## 2016-09-07 NOTE — ED Provider Notes (Signed)
[facility] DEPT Provider Note   CSN: [id number] Arrival date & time: 09/07/16  1705     History   Chief Complaint Chief Complaint  Patient presents with  . Chest Pain    HPI [place] D Hornstein is a 76 y.o. female.  Left lateral chest pain with radiation to right arm intermittently for 2 weeks. She has known history of atrial fibrillation. No dyspnea, diaphoresis, nausea. She was initially seen at the urgent care center. Severity of symptoms is mild. Nothing makes symptoms better or worse. She is ambulatory at home and doing her normal ADLs.      Past Medical History:  Diagnosis Date  . Anxiety   . Arthritis    back  . Atrial fibrillation ([place])   . Breast cancer ([place])    right breast cancer - 3 years ago  . Coronary artery disease   . Depression   . Diabetes mellitus without complication (HCC)    diet  . Dysrhythmia    afib  . Fibromyalgia   . Herniated disc   . Hyperlipemia   . Hypertension   . Iron deficiency anemia   . Lower back pain   . Lumbar stenosis    L4-5  . S/P radiation therapy 01/23/2013-03/06/2013   Right breast / 46 Gy in 23 fractions / Right breast boost / 14 Gy in 7 fractions  . Spondylolysis   . 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 Status post chemotherapy 11/04/2012 - 01/06/2013.   Docetaxel/Cytoxan Q 3 Weeks x 4 cycles  . Torn rotator cuff     Patient Active Problem List   Diagnosis Date Noted  . Chest pain 09/11/2014  . Hypokalemia 01/15/2014  . 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 Swelling of limb 01/23/2013  . Oropharyngeal candidiasis 01/13/2013  . Chemotherapy induced neutropenia ([place]) 01/13/2013  . UTI (urinary tract infection) 12/25/2012  . Cancer of central portion of right female breast ([place]) 09/06/2012  . Vulva cancer ([place]) 08/16/2011  . INSOMNIA 08/26/2010  . HEADACHE 08/26/2010  . TOBACCO USER 11/04/2009  . POSTMENOPAUSAL STATUS 11/04/2009  . DYSLIPIDEMIA  07/30/2009  . HAIR LOSS 07/30/2009  . ANEMIA 07/26/2009  . SINUSITIS, ACUTE 04/02/2009  . DIABETES MELLITUS, TYPE II 01/01/2009  . URINARY TRACT INFECTION SITE NOT SPECIFIED 11/26/2008  . BREAST PAIN, RIGHT 11/12/2008  . ANGIOEDEMA 10/05/2008  . CHEST PAIN, ATYPICAL 09/01/2008  . ALLERGIC RHINITIS 05/05/2008  . VAGINITIS 05/05/2008  . CAROTID BRUIT, RIGHT 11/12/2007  . SHOULDER PAIN, LEFT, CHRONIC 08/09/2007  . 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 DEGENERATIVE DISC DISEASE, LUMBOSACRAL SPINE 08/09/2007  . HYPERTENSION, BENIGN ESSENTIAL 03/20/2007  . IRREGULAR HEART RATE 03/20/2007  . BRONCHITIS, ACUTE 03/20/2007  . DEPRESSION 02/27/2007  . MURMUR 02/27/2007    Past Surgical History:  Procedure Laterality Date  . ABDOMINAL HYSTERECTOMY    . BREAST BIOPSY Right 09/04/2012  . BREAST LUMPECTOMY Right 10/08/2012  . COLONOSCOPY    . DILATION AND CURETTAGE OF UTERUS    . LEFT HEART CATHETERIZATION WITH CORONARY ANGIOGRAM N/A 09/14/2014   Procedure: LEFT HEART CATHETERIZATION WITH CORONARY ANGIOGRAM;  Surgeon: [name], MD;  Location: [facility] CATH LAB;  Service: Cardiovascular;  Laterality: N/A;  . OPEN REDUCTION INTERNAL FIXATION (ORIF) PROXIMAL PHALANX Right 01/04/2016   Procedure: OPEN TREATMENT OF RIGHT THUMB PROXIMAL PHALANX FRACTURE;  Surgeon: Milly Jakob, MD;  Location: [facility];  Service: Orthopedics;  Laterality: Right;  .  PARTIAL MASTECTOMY WITH NEEDLE LOCALIZATION AND AXILLARY SENTINEL LYMPH NODE BX Right 10/08/2012   Procedure: RIGHT PARTIAL MASTECTOMY WITH NEEDLE LOCALIZATION AND RIGHT AXILLARY SENTINEL LYMPH NODE BX;  Surgeon: Adin Hector, MD;  Location: [facility];  Service: General;  Laterality: Right;  Injection of 1% Methylene Blue dye into right breast  . PORTACATH PLACEMENT Left 10/08/2012   Procedure: INSERTION PORT-A-CATH WITH ULTRASOUND GUIDANCE;  Surgeon: Adin Hector, MD;  Location: [facility];  Service: General;  Laterality: Left;   Using 49F [place]  . Posterior Lumbar Arthrodesis, Pedicle screw fixation, Posterolateral Arthodesis  03/17/11  . SHOULDER ARTHROSCOPY WITH ROTATOR CUFF REPAIR AND SUBACROMIAL DECOMPRESSION  06/04/2012   Procedure: SHOULDER ARTHROSCOPY WITH ROTATOR CUFF REPAIR AND SUBACROMIAL DECOMPRESSION;  Surgeon: Nita Sells, MD;  Location: [facility];  Service: Orthopedics;  Laterality: Left;  LEFT SHOULDER ARTHROSCOPY WITH ROTATOR CUFF REPAIR AND SUBACROMIAL DECOMPRESSION AND DISTAL CLAVICLE RESECTION  . TONSILLECTOMY    . TUBAL LIGATION    . VULVECTOMY PARTIAL    . Wide Local Excision  01/2010   Bilateral groin excisions, re-excision of vulva    OB History    No data available       Home Medications    Prior to Admission medications   Medication Sig Start Date End Date Taking? Authorizing Provider  albuterol (PROVENTIL HFA;VENTOLIN HFA) 108 (90 BASE) MCG/ACT inhaler Inhale 2 puffs into the lungs every 4 (four) hours as needed for wheezing or shortness of breath. 05/12/15   Melony Overly, MD  amiodarone (PACERONE) 200 MG tablet Take 200 mg by mouth daily.  12/09/14   Historical Provider, MD  dabigatran (PRADAXA) 75 MG CAPS Take 75 mg by mouth every 12 (twelve) hours.    Historical Provider, MD  ferrous sulfate 325 (65 FE) MG tablet Take 325 mg by mouth daily with breakfast.    Historical Provider, MD  FLUoxetine (PROZAC) 10 MG capsule Take 10 mg by mouth daily.    Historical Provider, MD  gabapentin (NEURONTIN) 100 MG capsule 1 tab by mouth in morning, 1 tab by mouth mid-day, and 3 tabs by mouth at bedtime 03/05/13   Amy Milda Smart, PA-C  HYDROcodone-acetaminophen (NORCO/VICODIN) 5-325 MG per tablet Take 1-2 tablets by mouth every 4 (four) hours as needed for pain. 10/08/12   [name], MD  losartan (COZAAR) 100 MG tablet Take 1 tablet (100 mg total) by mouth daily. 09/15/14   [name], MD  metFORMIN (GLUCOPHAGE-XR) 500 MG 24 hr tablet Take 1 tablet (500 mg  total) by mouth daily with breakfast. Total dose of 1000mg  daily 09/16/14   [name], MD  NITROSTAT 0.4 MG SL tablet Place 0.4 mg under the tongue every 5 (five) minutes as needed for chest pain.  10/14/12   Historical Provider, MD  potassium chloride (K-DUR) 10 MEQ tablet Take 10 mEq by mouth daily.  09/04/14   Historical Provider, MD  pravastatin (PRAVACHOL) 40 MG tablet Take 40 mg by mouth every evening.    Historical Provider, MD  traZODone (DESYREL) 50 MG tablet Take 50 mg by mouth at bedtime.    Historical Provider, MD  triamterene-hydrochlorothiazide (DYAZIDE) 37.5-25 MG per capsule Take 1 capsule by mouth daily.  08/10/14   Historical Provider, MD    Family History Family History  Problem Relation Age of Onset  . Stroke Mother   . Alzheimer's disease Mother   . Heart attack Father   . Heart attack Brother     Social  History Social History  Substance Use Topics  . Smoking status: Former Smoker    Years: 50.00    Types: Cigarettes    Quit date: 05/29/2009  . Smokeless tobacco: Never Used  . Alcohol use Yes     Comment: rare     Allergies   Shellfish allergy   Review of Systems Review of Systems  All other systems reviewed and are negative.    Physical Exam Updated Vital Signs BP (!) 148/49   Pulse (!) 46   Temp 98.1 F (36.7 C) (Oral)   Resp 12   Ht 5' 7.5" (1.715 m)   Wt 198 lb (89.8 kg)   SpO2 100%   BMI 30.55 kg/m   Physical Exam  Constitutional: She is oriented to person, place, and time.  No acute distress  HENT:  Head: Normocephalic and atraumatic.  Eyes: Conjunctivae are normal.  Neck: Neck supple.  Cardiovascular: Normal rate.   Irregularly irregular.  Pulmonary/Chest: Effort normal and breath sounds normal.  Abdominal: Soft. Bowel sounds are normal.  Musculoskeletal: Normal range of motion.  Neurological: She is alert and oriented to person, place, and time.  Skin: Skin is warm and dry.  Psychiatric: She has a normal mood and affect. Her  behavior is normal.  Nursing note and vitals reviewed.    ED Treatments / Results  Labs (all labs ordered are listed, but only abnormal results are displayed) Labs Reviewed  BASIC METABOLIC PANEL - Abnormal; Notable for the following:       Result Value   Glucose, Bld 122 (*)    BUN 23 (*)    Creatinine, Ser 1.36 (*)    Calcium 8.6 (*)    GFR calc non Af Amer 38 (*)    GFR calc Af Amer 44 (*)    All other components within normal limits  CBC - Abnormal; Notable for the following:    Hemoglobin 11.9 (*)    RDW 15.8 (*)    All other components within normal limits  I-STAT TROPOININ, ED    EKG  EKG Interpretation  Date/Time:  Thursday September 07 2016 17:10:15 EDT Ventricular Rate:  69 PR Interval:    QRS Duration: 92 QT Interval:  458 QTC Calculation: 490 R Axis:   -17 Text Interpretation:  Atrial fibrillation Minimal voltage criteria for LVH, may be normal variant Anterior infarct , age undetermined Abnormal ECG Confirmed by COOK  MD, BRIAN ([id number]) on 09/07/2016 7:41:36 PM       Radiology Dg Chest 2 View  Result Date: 09/07/2016 CLINICAL DATA:  LEFT chest pain radiating to LEFT hip. Bradycardia. History of atrial fibrillation and hypertension. EXAM: CHEST  2 VIEW COMPARISON:  Chest radiograph December 07, 2014 FINDINGS: The cardiac silhouette is mild-to-moderately enlarged, unchanged. Mildly calcified aortic knob. Pulmonary vascular congestion without pleural effusion or focal consolidation. Strandy densities in lung bases. No pneumothorax. Surgical clips project in RIGHT breast. IMPRESSION: Stable cardiomegaly and pulmonary vascular congestion. Bibasilar atelectasis. Electronically Signed   By: Elon Alas M.D.   On: 09/07/2016 18:18    Procedures Procedures (including critical care time)  Medications Ordered in ED Medications - No data to display   Initial Impression / Assessment and Plan / ED Course  I have reviewed the triage vital signs and the nursing  notes.  Pertinent labs & imaging results that were available during my care of the patient were reviewed by me and considered in my medical decision making (see chart for details).  Patient appears in no acute distress. She is in atrial fibrillation, but her rate is controlled. Troponin is negative. Other tests are stable. She has primary care and cardiology follow-up.  Final Clinical Impressions(s) / ED Diagnoses   Final diagnoses:  Chest pain, unspecified type    New Prescriptions New Prescriptions   No medications on file     Nat Christen, MD 09/07/16 2118

## 2016-09-07 NOTE — ED Notes (Signed)
Patient reports having left arm, shoulder, left torso pain for a week.  Patient has seen her pcp and told her heart rate was slow.  Patient has irregular, slow pulse.  50 palpated radial pulse

## 2016-09-07 NOTE — ED Triage Notes (Signed)
Pt sent here from Urgent Care for "irregular heart rate and palpitations." She reports left sided chest pain and pain down left side of trunk area and occassional SOB. CP ongoing all week.

## 2016-09-12 ENCOUNTER — Ambulatory Visit (INDEPENDENT_AMBULATORY_CARE_PROVIDER_SITE_OTHER): Payer: Medicare Other | Admitting: Podiatry

## 2016-09-12 ENCOUNTER — Encounter: Payer: Self-pay | Admitting: Podiatry

## 2016-09-12 DIAGNOSIS — B351 Tinea unguium: Secondary | ICD-10-CM

## 2016-09-12 DIAGNOSIS — E1140 Type 2 diabetes mellitus with diabetic neuropathy, unspecified: Secondary | ICD-10-CM

## 2016-09-12 DIAGNOSIS — M79676 Pain in unspecified toe(s): Secondary | ICD-10-CM | POA: Diagnosis not present

## 2016-09-12 DIAGNOSIS — M2011 Hallux valgus (acquired), right foot: Secondary | ICD-10-CM

## 2016-09-12 NOTE — Progress Notes (Signed)
Patient ID: Diane [name], female   DOB: 1941-05-15, 76 y.o.   MRN: [id number]   Complaint:  Visit Type: Patient returns to my office for continued preventative foot care services. Complaint: Patient states" my nails have grown long and thick and become painful to walk and wear shoes" Patient has been diagnosed with DM with neuropathy.. The patient presents for preventative foot care services. No changes to ROS.    Podiatric Exam: Vascular: dorsalis pedis and posterior tibial pulses are palpable bilateral. Capillary return is immediate. Temperature gradient is WNL. Skin turgor WNL  Sensorium: Diminished  Semmes Weinstein monofilament test. Normal tactile sensation bilaterally. Nail Exam: Pt has thick disfigured discolored nails with subungual debris noted bilateral entire nail hallux through fifth toenails Ulcer Exam: There is no evidence of ulcer or pre-ulcerative changes or infection. Orthopedic Exam: Muscle tone and strength are WNL. No limitations in general ROM. No crepitus or effusions noted. Foot type and digits show no abnormalities.HAV 1st MPJ B/L. Skin: No Porokeratosis. No infection or ulcers  Diagnosis:  Onychomycosis, , Pain in right toe, pain in left toes  Treatment & Plan Procedures and Treatment: Consent by patient was obtained for treatment procedures. The patient understood the discussion of treatment and procedures well. All questions were answered thoroughly reviewed. Debridement of mycotic and hypertrophic toenails, 1 through 5 bilateral and clearing of subungual debris. No ulceration, no infection noted.  .   Return Visit-Office Procedure: Patient instructed to return to the office for a follow up visit 3 months for continued evaluation and treatment.   Gardiner Barefoot DPM

## 2016-10-16 DIAGNOSIS — E119 Type 2 diabetes mellitus without complications: Secondary | ICD-10-CM | POA: Diagnosis not present

## 2016-10-16 DIAGNOSIS — I10 Essential (primary) hypertension: Secondary | ICD-10-CM | POA: Diagnosis not present

## 2016-10-16 DIAGNOSIS — E669 Obesity, unspecified: Secondary | ICD-10-CM | POA: Diagnosis not present

## 2016-10-16 DIAGNOSIS — Z7984 Long term (current) use of oral hypoglycemic drugs: Secondary | ICD-10-CM | POA: Diagnosis not present

## 2016-10-16 DIAGNOSIS — Z79899 Other long term (current) drug therapy: Secondary | ICD-10-CM | POA: Diagnosis not present

## 2016-10-16 DIAGNOSIS — I480 Paroxysmal atrial fibrillation: Secondary | ICD-10-CM | POA: Diagnosis not present

## 2016-10-16 DIAGNOSIS — Z6831 Body mass index (BMI) 31.0-31.9, adult: Secondary | ICD-10-CM | POA: Diagnosis not present

## 2016-10-17 DIAGNOSIS — Z7984 Long term (current) use of oral hypoglycemic drugs: Secondary | ICD-10-CM | POA: Diagnosis not present

## 2016-10-17 DIAGNOSIS — I480 Paroxysmal atrial fibrillation: Secondary | ICD-10-CM | POA: Diagnosis not present

## 2016-10-17 DIAGNOSIS — E119 Type 2 diabetes mellitus without complications: Secondary | ICD-10-CM | POA: Diagnosis not present

## 2016-11-17 DIAGNOSIS — I209 Angina pectoris, unspecified: Secondary | ICD-10-CM | POA: Diagnosis not present

## 2016-11-17 DIAGNOSIS — E119 Type 2 diabetes mellitus without complications: Secondary | ICD-10-CM | POA: Diagnosis not present

## 2016-11-17 DIAGNOSIS — I480 Paroxysmal atrial fibrillation: Secondary | ICD-10-CM | POA: Diagnosis not present

## 2016-11-17 DIAGNOSIS — D509 Iron deficiency anemia, unspecified: Secondary | ICD-10-CM | POA: Diagnosis not present

## 2016-11-17 DIAGNOSIS — Z7984 Long term (current) use of oral hypoglycemic drugs: Secondary | ICD-10-CM | POA: Diagnosis not present

## 2016-11-17 DIAGNOSIS — I10 Essential (primary) hypertension: Secondary | ICD-10-CM | POA: Diagnosis not present

## 2016-11-17 DIAGNOSIS — I481 Persistent atrial fibrillation: Secondary | ICD-10-CM | POA: Diagnosis not present

## 2016-11-20 DIAGNOSIS — Z79899 Other long term (current) drug therapy: Secondary | ICD-10-CM | POA: Diagnosis not present

## 2016-12-12 ENCOUNTER — Ambulatory Visit: Payer: Medicare Other | Admitting: Podiatry

## 2017-01-02 ENCOUNTER — Ambulatory Visit (INDEPENDENT_AMBULATORY_CARE_PROVIDER_SITE_OTHER): Payer: Medicare Other | Admitting: Podiatry

## 2017-01-02 DIAGNOSIS — B351 Tinea unguium: Secondary | ICD-10-CM | POA: Diagnosis not present

## 2017-01-02 DIAGNOSIS — E1140 Type 2 diabetes mellitus with diabetic neuropathy, unspecified: Secondary | ICD-10-CM | POA: Diagnosis not present

## 2017-01-02 DIAGNOSIS — M79676 Pain in unspecified toe(s): Secondary | ICD-10-CM | POA: Diagnosis not present

## 2017-01-02 DIAGNOSIS — M25572 Pain in left ankle and joints of left foot: Secondary | ICD-10-CM

## 2017-01-02 DIAGNOSIS — M7752 Other enthesopathy of left foot: Secondary | ICD-10-CM | POA: Diagnosis not present

## 2017-01-02 NOTE — Progress Notes (Addendum)
Patient ID: Diane [name], female   DOB: Apr 13, 1941, 76 y.o.   MRN: [id number]   Complaint:  Visit Type: Patient returns to my office for continued preventative foot care services. Complaint: Patient states" my nails have grown long and thick and become painful to walk and wear shoes" Patient has been diagnosed with DM with neuropathy.. The patient presents for preventative foot care services. No changes to ROS. She also requests an evaluation of her left ankle.  She says that she initially sprained her left ankle and it has not healed since the injury.  I dispensed a compression anklet for her to wear which she said was not beneficial.  She presents to the office today with pain along the lower end of left ankle.  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   Podiatric Exam: Vascular: dorsalis pedis and posterior tibial pulses are palpable bilateral. Capillary return is immediate. Temperature gradient is WNL. Skin turgor WNL  Sensorium: Diminished  Semmes Weinstein monofilament test. Normal tactile sensation bilaterally. Nail Exam: Pt has thick disfigured discolored nails with subungual debris noted bilateral entire nail hallux through fifth toenails Ulcer Exam: There is no evidence of ulcer or pre-ulcerative changes or infection. Orthopedic Exam: Muscle tone and strength are WNL. No limitations in general ROM. No crepitus or effusions noted. Foot type and digits show no abnormalities.HAV 1st MPJ B/L. Sinus tarsitis left ankle.  Significant muscle weakness left ankle. Skin: No Porokeratosis. No infection or ulcers  Diagnosis:  Onychomycosis, , Pain in right toe, pain in left toes Sinus tarsitis left foot.  Treatment & Plan Procedures and Treatment: Consent by patient was obtained for treatment procedures. The patient understood the discussion of treatment and procedures well. All questions were answered thoroughly reviewed. Debridement of mycotic and  hypertrophic toenails, 1 through 5 bilateral and clearing of subungual debris. No ulceration, no infection noted.  Injection therapy left sinus tarsi left foot..   Return Visit-Office Procedure: Patient instructed to return to the office for a follow up visit 3 months for continued evaluation and treatment. Upon discussion of her ankle. We chose to provide her with injection therapy today.  She says she will return to the office in 3 months for her nail care, and we can then discuss and ankle evaluation by Dr. Amalia Hailey or Jacqualyn Posey.   Gardiner Barefoot DPM

## 2017-04-04 ENCOUNTER — Encounter: Payer: Self-pay | Admitting: Podiatry

## 2017-04-04 ENCOUNTER — Ambulatory Visit (INDEPENDENT_AMBULATORY_CARE_PROVIDER_SITE_OTHER): Payer: Medicare Other | Admitting: Podiatry

## 2017-04-04 DIAGNOSIS — E1140 Type 2 diabetes mellitus with diabetic neuropathy, unspecified: Secondary | ICD-10-CM

## 2017-04-04 DIAGNOSIS — M2012 Hallux valgus (acquired), left foot: Secondary | ICD-10-CM

## 2017-04-04 DIAGNOSIS — B351 Tinea unguium: Secondary | ICD-10-CM | POA: Diagnosis not present

## 2017-04-04 DIAGNOSIS — M79676 Pain in unspecified toe(s): Secondary | ICD-10-CM | POA: Diagnosis not present

## 2017-04-04 DIAGNOSIS — M2011 Hallux valgus (acquired), right foot: Secondary | ICD-10-CM

## 2017-04-04 NOTE — Progress Notes (Signed)
Patient ID: Diane [name], female   DOB: July 13, 1940, 76 y.o.   MRN: [id number]   Complaint:  Visit Type: Patient returns to my office for continued preventative foot care services. Complaint: Patient states" my nails have grown long and thick and become painful to walk and wear shoes" Patient has been diagnosed with DM with neuropathy.. The patient presents for preventative foot care services. No changes to ROS.    Podiatric Exam: Vascular: dorsalis pedis and posterior tibial pulses are palpable bilateral. Capillary return is immediate. Temperature gradient is WNL. Skin turgor WNL  Sensorium: Diminished  Semmes Weinstein monofilament test. Normal tactile sensation bilaterally. Nail Exam: Pt has thick disfigured discolored nails with subungual debris noted bilateral entire nail hallux through fifth toenails Ulcer Exam: There is no evidence of ulcer or pre-ulcerative changes or infection. Orthopedic Exam: Muscle tone and strength are WNL. No limitations in general ROM. No crepitus or effusions noted. Foot type and digits show no abnormalities.HAV 1st MPJ B/L. Skin: No Porokeratosis. No infection or ulcers  Diagnosis:  Onychomycosis, , Pain in right toe, pain in left toes  HAV  B/L.  Treatment & Plan Procedures and Treatment: Consent by patient was obtained for treatment procedures. The patient understood the discussion of treatment and procedures well. All questions were answered thoroughly reviewed. Debridement of mycotic and hypertrophic toenails, 1 through 5 bilateral and clearing of subungual debris. No ulceration, no infection noted.  .   Return Visit-Office Procedure: Patient instructed to return to the office for a follow up visit 3 months for continued evaluation and treatment.   Gardiner Barefoot DPM

## 2017-05-16 ENCOUNTER — Ambulatory Visit (INDEPENDENT_AMBULATORY_CARE_PROVIDER_SITE_OTHER): Payer: Medicare Other | Admitting: Orthotics

## 2017-05-16 DIAGNOSIS — E1140 Type 2 diabetes mellitus with diabetic neuropathy, unspecified: Secondary | ICD-10-CM

## 2017-05-16 DIAGNOSIS — M2012 Hallux valgus (acquired), left foot: Secondary | ICD-10-CM

## 2017-05-16 DIAGNOSIS — M7750 Other enthesopathy of unspecified foot: Secondary | ICD-10-CM

## 2017-05-16 DIAGNOSIS — M25572 Pain in left ankle and joints of left foot: Secondary | ICD-10-CM

## 2017-05-16 DIAGNOSIS — M2011 Hallux valgus (acquired), right foot: Secondary | ICD-10-CM

## 2017-05-17 NOTE — Progress Notes (Signed)

## 2017-06-19 ENCOUNTER — Encounter (HOSPITAL_COMMUNITY): Payer: Self-pay | Admitting: Emergency Medicine

## 2017-06-19 ENCOUNTER — Inpatient Hospital Stay (HOSPITAL_COMMUNITY)
Admission: EM | Admit: 2017-06-19 | Discharge: 2017-06-24 | DRG: 291 | Disposition: A | Discharge status: Home / Self Care | Payer: Medicare Other | Attending: Cardiology | Admitting: Cardiology

## 2017-06-19 ENCOUNTER — Ambulatory Visit (HOSPITAL_COMMUNITY)
Admission: EM | Admit: 2017-06-19 | Discharge: 2017-06-19 | Disposition: A | Discharge status: Home / Self Care | Payer: Medicare Other | Source: Home / Self Care

## 2017-06-19 ENCOUNTER — Other Ambulatory Visit: Payer: Self-pay

## 2017-06-19 ENCOUNTER — Emergency Department (HOSPITAL_COMMUNITY): Payer: Medicare Other

## 2017-06-19 DIAGNOSIS — Z7901 Long term (current) use of anticoagulants: Secondary | ICD-10-CM

## 2017-06-19 DIAGNOSIS — Z8544 Personal history of malignant neoplasm of other female genital organs: Secondary | ICD-10-CM | POA: Diagnosis not present

## 2017-06-19 DIAGNOSIS — J189 Pneumonia, unspecified organism: Secondary | ICD-10-CM | POA: Diagnosis present

## 2017-06-19 DIAGNOSIS — Z87891 Personal history of nicotine dependence: Secondary | ICD-10-CM

## 2017-06-19 DIAGNOSIS — F419 Anxiety disorder, unspecified: Secondary | ICD-10-CM | POA: Diagnosis present

## 2017-06-19 DIAGNOSIS — Z8673 Personal history of transient ischemic attack (TIA), and cerebral infarction without residual deficits: Secondary | ICD-10-CM

## 2017-06-19 DIAGNOSIS — E785 Hyperlipidemia, unspecified: Secondary | ICD-10-CM | POA: Diagnosis present

## 2017-06-19 DIAGNOSIS — Z853 Personal history of malignant neoplasm of breast: Secondary | ICD-10-CM

## 2017-06-19 DIAGNOSIS — N183 Chronic kidney disease, stage 3 (moderate): Secondary | ICD-10-CM | POA: Diagnosis present

## 2017-06-19 DIAGNOSIS — Z923 Personal history of irradiation: Secondary | ICD-10-CM | POA: Diagnosis not present

## 2017-06-19 DIAGNOSIS — Z90710 Acquired absence of both cervix and uterus: Secondary | ICD-10-CM

## 2017-06-19 DIAGNOSIS — E1122 Type 2 diabetes mellitus with diabetic chronic kidney disease: Secondary | ICD-10-CM | POA: Diagnosis present

## 2017-06-19 DIAGNOSIS — I5023 Acute on chronic systolic (congestive) heart failure: Secondary | ICD-10-CM | POA: Diagnosis present

## 2017-06-19 DIAGNOSIS — I482 Chronic atrial fibrillation: Secondary | ICD-10-CM | POA: Diagnosis present

## 2017-06-19 DIAGNOSIS — M797 Fibromyalgia: Secondary | ICD-10-CM | POA: Diagnosis present

## 2017-06-19 DIAGNOSIS — I200 Unstable angina: Secondary | ICD-10-CM | POA: Diagnosis present

## 2017-06-19 DIAGNOSIS — D638 Anemia in other chronic diseases classified elsewhere: Secondary | ICD-10-CM | POA: Diagnosis present

## 2017-06-19 DIAGNOSIS — F329 Major depressive disorder, single episode, unspecified: Secondary | ICD-10-CM | POA: Diagnosis present

## 2017-06-19 DIAGNOSIS — I130 Hypertensive heart and chronic kidney disease with heart failure and stage 1 through stage 4 chronic kidney disease, or unspecified chronic kidney disease: Principal | ICD-10-CM | POA: Diagnosis present

## 2017-06-19 DIAGNOSIS — I340 Nonrheumatic mitral (valve) insufficiency: Secondary | ICD-10-CM | POA: Diagnosis present

## 2017-06-19 DIAGNOSIS — Z79899 Other long term (current) drug therapy: Secondary | ICD-10-CM

## 2017-06-19 DIAGNOSIS — I509 Heart failure, unspecified: Secondary | ICD-10-CM

## 2017-06-19 DIAGNOSIS — I25110 Atherosclerotic heart disease of native coronary artery with unstable angina pectoris: Secondary | ICD-10-CM | POA: Diagnosis present

## 2017-06-19 DIAGNOSIS — Z794 Long term (current) use of insulin: Secondary | ICD-10-CM

## 2017-06-19 LAB — CBC
HCT: 34.4 % — ABNORMAL LOW (ref 36.0–46.0)
Hemoglobin: 11.5 g/dL — ABNORMAL LOW (ref 12.0–15.0)
MCH: 27.2 pg (ref 26.0–34.0)
MCHC: 33.4 g/dL (ref 30.0–36.0)
MCV: 81.3 fL (ref 78.0–100.0)
Platelets: 150 10*3/uL (ref 150–400)
RBC: 4.23 MIL/uL (ref 3.87–5.11)
RDW: 18.6 % — ABNORMAL HIGH (ref 11.5–15.5)
WBC: 9.4 10*3/uL (ref 4.0–10.5)

## 2017-06-19 LAB — BRAIN NATRIURETIC PEPTIDE: B Natriuretic Peptide: 393.4 pg/mL — ABNORMAL HIGH (ref 0.0–100.0)

## 2017-06-19 LAB — BASIC METABOLIC PANEL
Anion gap: 14 (ref 5–15)
BUN: 12 mg/dL (ref 6–20)
CO2: 18 mmol/L — ABNORMAL LOW (ref 22–32)
Calcium: 8.6 mg/dL — ABNORMAL LOW (ref 8.9–10.3)
Chloride: 104 mmol/L (ref 101–111)
Creatinine, Ser: 1.24 mg/dL — ABNORMAL HIGH (ref 0.44–1.00)
GFR calc Af Amer: 48 mL/min — ABNORMAL LOW (ref >60)
GFR calc non Af Amer: 42 mL/min — ABNORMAL LOW (ref >60)
Glucose, Bld: 118 mg/dL — ABNORMAL HIGH (ref 65–99)
Potassium: 3.8 mmol/L (ref 3.5–5.1)
Sodium: 136 mmol/L (ref 135–145)

## 2017-06-19 LAB — I-STAT TROPONIN, ED
Troponin i, poc: 0.01 ng/mL (ref 0.00–0.08)
Troponin i, poc: 0.01 ng/mL (ref 0.00–0.08)

## 2017-06-19 LAB — GLUCOSE, CAPILLARY: Glucose-Capillary: 109 mg/dL — ABNORMAL HIGH (ref 65–99)

## 2017-06-19 MED ORDER — DEXTROSE 5 % IV SOLN
1.0000 g | INTRAVENOUS | Status: DC
  Administered 2017-06-19 – 2017-06-23 (×5): 1 g via INTRAVENOUS
  Filled 2017-06-19 (×5): qty 10

## 2017-06-19 MED ORDER — SODIUM CHLORIDE 0.9 % IV SOLN
INTRAVENOUS | Status: DC
  Administered 2017-06-19: 23:00:00 via INTRAVENOUS

## 2017-06-19 MED ORDER — LOSARTAN POTASSIUM 50 MG PO TABS
100.0000 mg | ORAL_TABLET | Freq: Every day | ORAL | Status: DC
  Administered 2017-06-20: 100 mg via ORAL
  Filled 2017-06-19 (×2): qty 2

## 2017-06-19 MED ORDER — HEPARIN (PORCINE) IN NACL 100-0.45 UNIT/ML-% IJ SOLN
1300.0000 [IU]/h | INTRAMUSCULAR | Status: DC
  Administered 2017-06-19: 800 [IU]/h via INTRAVENOUS
  Administered 2017-06-20 – 2017-06-24 (×5): 1300 [IU]/h via INTRAVENOUS
  Filled 2017-06-19 (×6): qty 250

## 2017-06-19 MED ORDER — ASPIRIN 81 MG PO CHEW
324.0000 mg | CHEWABLE_TABLET | Freq: Once | ORAL | Status: AC
  Administered 2017-06-19: 324 mg via ORAL
  Filled 2017-06-19: qty 4

## 2017-06-19 MED ORDER — NITROGLYCERIN 0.4 MG SL SUBL: 0.4000 mg | SUBLINGUAL_TABLET | SUBLINGUAL | Status: DC | PRN

## 2017-06-19 MED ORDER — FUROSEMIDE 10 MG/ML IJ SOLN
40.0000 mg | Freq: Two times a day (BID) | INTRAMUSCULAR | Status: DC
  Administered 2017-06-20 – 2017-06-24 (×9): 40 mg via INTRAVENOUS
  Filled 2017-06-19 (×9): qty 4

## 2017-06-19 MED ORDER — METOPROLOL TARTRATE 12.5 MG HALF TABLET
12.5000 mg | ORAL_TABLET | Freq: Two times a day (BID) | ORAL | Status: DC
  Administered 2017-06-19 – 2017-06-23 (×5): 12.5 mg via ORAL
  Filled 2017-06-19 (×8): qty 1

## 2017-06-19 MED ORDER — HEPARIN (PORCINE) IN NACL 100-0.45 UNIT/ML-% IJ SOLN: 12.0000 [IU]/kg/h | INTRAMUSCULAR | Status: DC

## 2017-06-19 MED ORDER — FLUOXETINE HCL 10 MG PO CAPS
10.0000 mg | ORAL_CAPSULE | Freq: Every day | ORAL | Status: DC
  Administered 2017-06-20 – 2017-06-24 (×5): 10 mg via ORAL
  Filled 2017-06-19 (×5): qty 1

## 2017-06-19 MED ORDER — NITROGLYCERIN 2 % TD OINT
0.5000 [in_us] | TOPICAL_OINTMENT | Freq: Four times a day (QID) | TRANSDERMAL | Status: DC
  Administered 2017-06-19 – 2017-06-24 (×18): 0.5 [in_us] via TOPICAL
  Filled 2017-06-19: qty 30

## 2017-06-19 MED ORDER — LEVALBUTEROL HCL 1.25 MG/0.5ML IN NEBU
1.2500 mg | INHALATION_SOLUTION | Freq: Three times a day (TID) | RESPIRATORY_TRACT | Status: DC
  Administered 2017-06-20: 1.25 mg via RESPIRATORY_TRACT
  Filled 2017-06-19: qty 0.5

## 2017-06-19 MED ORDER — FUROSEMIDE 10 MG/ML IJ SOLN
40.0000 mg | INTRAMUSCULAR | Status: AC
  Administered 2017-06-19: 40 mg via INTRAVENOUS
  Filled 2017-06-19: qty 4

## 2017-06-19 MED ORDER — POTASSIUM CHLORIDE ER 10 MEQ PO TBCR
10.0000 meq | EXTENDED_RELEASE_TABLET | Freq: Two times a day (BID) | ORAL | Status: DC
  Administered 2017-06-19 – 2017-06-20 (×2): 10 meq via ORAL
  Filled 2017-06-19 (×3): qty 1

## 2017-06-19 MED ORDER — ASPIRIN 81 MG PO CHEW: 324.0000 mg | CHEWABLE_TABLET | ORAL | Status: DC

## 2017-06-19 MED ORDER — ATORVASTATIN CALCIUM 40 MG PO TABS
40.0000 mg | ORAL_TABLET | Freq: Every day | ORAL | Status: DC
  Administered 2017-06-20 – 2017-06-23 (×4): 40 mg via ORAL
  Filled 2017-06-19 (×4): qty 1

## 2017-06-19 MED ORDER — HEPARIN BOLUS VIA INFUSION: 4000.0000 [IU] | Freq: Once | INTRAVENOUS | Status: DC

## 2017-06-19 MED ORDER — ASPIRIN 300 MG RE SUPP
300.0000 mg | RECTAL | Status: DC
  Filled 2017-06-19: qty 1

## 2017-06-19 MED ORDER — INSULIN ASPART 100 UNIT/ML ~~LOC~~ SOLN: 0.0000 [IU] | Freq: Three times a day (TID) | SUBCUTANEOUS | Status: DC

## 2017-06-19 MED ORDER — ASPIRIN EC 81 MG PO TBEC
81.0000 mg | DELAYED_RELEASE_TABLET | Freq: Every day | ORAL | Status: DC
  Administered 2017-06-20 – 2017-06-24 (×5): 81 mg via ORAL
  Filled 2017-06-19 (×5): qty 1

## 2017-06-19 NOTE — ED Notes (Signed)
Pt just informed me that while leaving her house going down the ramp she fell off of chair and hit the back of her heard, No LOC, pt report headache.

## 2017-06-19 NOTE — ED Triage Notes (Signed)
Pt arrives to ED with daughter from home with chest pain and sob that started last night and was unable to lay flat due to sob and pain in chest. Pain is radiating into left arm. Pt was 83% on room air and applied 3L [place] sats 91%.

## 2017-06-19 NOTE — Plan of Care (Signed)
  Health Behavior/Discharge Planning: Ability to manage health-related needs will improve 06/19/2017 2339 - Progressing by Tristan Schroeder, RN   Clinical Measurements: Ability to maintain clinical measurements within normal limits will improve 06/19/2017 2339 - Progressing by Tristan Schroeder, RN   Clinical Measurements: Respiratory complications will improve 06/19/2017 2339 - Progressing by Tristan Schroeder, RN   Activity: Risk for activity intolerance will decrease 06/19/2017 2339 - Progressing by Tristan Schroeder, RN

## 2017-06-19 NOTE — ED Notes (Signed)
Patient has had recent "sinus symptoms" patient significant increase in sob last night and increase in breathing effort with activity.  Patient recovers quickly with rest.  Sat at rest 88.  Skin warm and dry, denies pain, patient alert and oriented x 4 and speaking in complete sentences.  States cardiologist saw her Thursday and told her to go to ed if breathing worsened,

## 2017-06-19 NOTE — H&P (Signed)
Diane [name] is an 77 y.o. female.   Chief Complaint: Chest/left arm pain associated with shortness of breath coughing and chills HPI: Patient is 77 year old female with past medical history significant for moderate multivessel coronary artery disease, hypertension, diabetes mellitus, hyperlipidemia, history of CVA, chronic atrial fibrillation, moderate mitral regurgitation, history of breast cancer, came to the ED complaining of left-sided chest pain radiating to left arm last night associated with coughing chills and worsening shortness of breath. Patient denies any fever but complains of chills. Denies noncompliance to medication denies excessive salty food intake. Patient gives history of PND orthopnea but denies neck swelling. Denies palpitation lightheadedness or syncope. EKG done in the ED showed A. fib with controlled ventricular response and poor R-wave progression in anterior leads. First set of troponin I is negative.  Past Medical History:  Diagnosis Date  . Anxiety   . Arthritis    back  . Atrial fibrillation ([place])   . Breast cancer ([place])    right breast cancer - 3 years ago  . Coronary artery disease   . Depression   . Diabetes mellitus without complication (HCC)    diet  . Dysrhythmia    afib  . Fibromyalgia   . Herniated disc   . Hyperlipemia   . Hypertension   . Iron deficiency anemia   . Lower back pain   . Lumbar stenosis    L4-5  . S/P radiation therapy 01/23/2013-03/06/2013   Right breast / 46 Gy in 23 fractions / Right breast boost / 14 Gy in 7 fractions  . Spondylolysis   . 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 Status post chemotherapy 11/04/2012 - 01/06/2013.   Docetaxel/Cytoxan Q 3 Weeks x 4 cycles  . Torn rotator cuff     Past Surgical History:  Procedure Laterality Date  . ABDOMINAL HYSTERECTOMY    . BREAST BIOPSY Right 09/04/2012  . BREAST LUMPECTOMY Right 10/08/2012  . COLONOSCOPY    . DILATION AND CURETTAGE OF  UTERUS    . LEFT HEART CATHETERIZATION WITH CORONARY ANGIOGRAM N/A 09/14/2014   Procedure: LEFT HEART CATHETERIZATION WITH CORONARY ANGIOGRAM;  Surgeon: [name], MD;  Location: [facility] CATH LAB;  Service: Cardiovascular;  Laterality: N/A;  . OPEN REDUCTION INTERNAL FIXATION (ORIF) PROXIMAL PHALANX Right 01/04/2016   Procedure: OPEN TREATMENT OF RIGHT THUMB PROXIMAL PHALANX FRACTURE;  Surgeon: Milly Jakob, MD;  Location: [place];  Service: Orthopedics;  Laterality: Right;  . PARTIAL MASTECTOMY WITH NEEDLE LOCALIZATION AND AXILLARY SENTINEL LYMPH NODE BX Right 10/08/2012   Procedure: RIGHT PARTIAL MASTECTOMY WITH NEEDLE LOCALIZATION AND RIGHT AXILLARY SENTINEL LYMPH NODE BX;  Surgeon: Adin Hector, MD;  Location: [place];  Service: General;  Laterality: Right;  Injection of 1% Methylene Blue dye into right breast  . PORTACATH PLACEMENT Left 10/08/2012   Procedure: INSERTION PORT-A-CATH WITH ULTRASOUND GUIDANCE;  Surgeon: Adin Hector, MD;  Location: [place];  Service: General;  Laterality: Left;  Using 23F [place]  . Posterior Lumbar Arthrodesis, Pedicle screw fixation, Posterolateral Arthodesis  03/17/11  . SHOULDER ARTHROSCOPY WITH ROTATOR CUFF REPAIR AND SUBACROMIAL DECOMPRESSION  06/04/2012   Procedure: SHOULDER ARTHROSCOPY WITH ROTATOR CUFF REPAIR AND SUBACROMIAL DECOMPRESSION;  Surgeon: Nita Sells, MD;  Location: [place];  Service: Orthopedics;  Laterality: Left;  LEFT SHOULDER ARTHROSCOPY WITH ROTATOR CUFF REPAIR AND SUBACROMIAL DECOMPRESSION AND DISTAL CLAVICLE RESECTION  . TONSILLECTOMY    . TUBAL LIGATION    .  VULVECTOMY PARTIAL    . Wide Local Excision  01/2010   Bilateral groin excisions, re-excision of vulva    Family History  Problem Relation Age of Onset  . Stroke Mother   . Alzheimer's disease Mother   . Heart attack Father   . Heart attack Brother    Social History:  reports that she quit smoking about 8 years ago.  Her smoking use included cigarettes. She quit after 50.00 years of use. she has never used smokeless tobacco. She reports that she drinks alcohol. She reports that she does not use drugs.  Allergies:  Allergies  Allergen Reactions  . Shellfish Allergy Anaphylaxis     (Not in a hospital admission)  Results for orders placed or performed during the hospital encounter of 06/19/17 (from the past 48 hour(s))  Basic metabolic panel     Status: Abnormal   Collection Time: 06/19/17  2:38 PM  Result Value Ref Range   Sodium 136 135 - 145 mmol/L   Potassium 3.8 3.5 - 5.1 mmol/L   Chloride 104 101 - 111 mmol/L   CO2 18 (L) 22 - 32 mmol/L   Glucose, Bld 118 (H) 65 - 99 mg/dL   BUN 12 6 - 20 mg/dL   Creatinine, Ser 1.24 (H) 0.44 - 1.00 mg/dL   Calcium 8.6 (L) 8.9 - 10.3 mg/dL   GFR calc non Af Amer 42 (L) >60 mL/min   GFR calc Af Amer 48 (L) >60 mL/min    Comment: (NOTE) The eGFR has been calculated using the CKD EPI equation. This calculation has not been validated in all clinical situations. eGFR's persistently <60 mL/min signify possible Chronic Kidney Disease.    Anion gap 14 5 - 15  CBC     Status: Abnormal   Collection Time: 06/19/17  2:38 PM  Result Value Ref Range   WBC 9.4 4.0 - 10.5 K/uL   RBC 4.23 3.87 - 5.11 MIL/uL   Hemoglobin 11.5 (L) 12.0 - 15.0 g/dL   HCT 34.4 (L) 36.0 - 46.0 %   MCV 81.3 78.0 - 100.0 fL   MCH 27.2 26.0 - 34.0 pg   MCHC 33.4 30.0 - 36.0 g/dL   RDW 18.6 (H) 11.5 - 15.5 %   Platelets 150 150 - 400 K/uL  Brain natriuretic peptide     Status: Abnormal   Collection Time: 06/19/17  2:38 PM  Result Value Ref Range   B Natriuretic Peptide 393.4 (H) 0.0 - 100.0 pg/mL  I-stat troponin, ED     Status: None   Collection Time: 06/19/17  2:49 PM  Result Value Ref Range   Troponin i, poc 0.01 0.00 - 0.08 ng/mL   Comment 3            Comment: Due to the release kinetics of cTnI, a negative result within the first hours of the onset of symptoms does not rule  out myocardial infarction with certainty. If myocardial infarction is still suspected, repeat the test at appropriate intervals.   I-stat troponin, ED     Status: None   Collection Time: 06/19/17  5:26 PM  Result Value Ref Range   Troponin i, poc 0.01 0.00 - 0.08 ng/mL   Comment 3            Comment: Due to the release kinetics of cTnI, a negative result within the first hours of the onset of symptoms does not rule out myocardial infarction with certainty. If myocardial infarction is still suspected, repeat the  test at appropriate intervals.    Dg Chest 2 View  Result Date: 06/19/2017 CLINICAL DATA:  Significant increase in dyspnea last evening. EXAM: CHEST  2 VIEW COMPARISON:  09/07/2016 FINDINGS: Stable cardiomegaly with aortic atherosclerosis. Central pulmonary vascular congestion with bilateral airspace opacities and small bilateral pleural effusions are suspicious for stigmata of CHF. Superimposed pneumonia would be difficult to entirely exclude. Surgical clips project over the right breast. IMPRESSION: Stable cardiomegaly with aortic atherosclerosis. Central pulmonary vascular congestion and bilateral patchy airspace opacities with small bilateral pleural effusions are more likely to represent changes of CHF. Superimposed pneumonia would be difficult to entirely exclude. Electronically Signed   By: Ashley Royalty M.D.   On: 06/19/2017 15:23    Review of Systems  Constitutional: Positive for chills and malaise/fatigue. Negative for fever.  Respiratory: Positive for cough and shortness of breath.   Cardiovascular: Positive for chest pain, orthopnea and PND.  Gastrointestinal: Negative for nausea and vomiting.  Genitourinary: Negative for dysuria.  Neurological: Negative for dizziness.    Blood pressure (!) 150/80, pulse 89, temperature 97.8 F (36.6 C), temperature source Oral, resp. rate (!) 26, height '5\' 7"'$  (1.702 m), weight 97.1 kg (214 lb), SpO2 (!) 85 %. Physical Exam   Constitutional: She is oriented to person, place, and time.  HENT:  Head: Normocephalic and atraumatic.  Eyes: Conjunctivae are normal. Pupils are equal, round, and reactive to light. Left eye exhibits no discharge. No scleral icterus.  Neck: Normal range of motion. Neck supple. JVD present.  Cardiovascular:  Irregularly irregular S1 and S2 soft there is 2/6 systolic murmur and S3 gallop noted  Respiratory:  Decreased breath sound at bases with bilateral rhonchi and rales noted  GI: Soft. Bowel sounds are normal. She exhibits no distension. There is no tenderness. There is no rebound.  Musculoskeletal: She exhibits no edema, tenderness or deformity.  Neurological: She is alert and oriented to person, place, and time.     Assessment/Plan Decompensated congestive heart failure secondary to preserved LV systolic function and rule out ischemia/rule out bilateral pneumonia Valvular heart disease Unstable angina rule out MI Multivessel moderate CAD Chronic atrial fibrillation chadsvasc score of 7 on chronic anticoagulation Hypertension Diabetes mellitus Chronic kidney disease History of CVA History of breast cancer Plan As per orders [name], MD 06/19/2017, 5:50 PM

## 2017-06-19 NOTE — ED Notes (Signed)
Discussed pt.vs and transport plan with asst director-melissa.  Transport as planned

## 2017-06-19 NOTE — Progress Notes (Signed)
ANTICOAGULATION CONSULT NOTE - Initial Consult  Pharmacy Consult for Heparin Indication: atrial fibrillation and chest pain/ACS  Allergies  Allergen Reactions  . Shellfish Allergy Anaphylaxis    Patient Measurements: Weight: 214 lb (97.1 kg) Heparin Dosing Weight: 83kg  Vital Signs: Temp: 97.8 F (36.6 C) (01/22 1401) Temp Source: Oral (01/22 1401) BP: 152/69 (01/22 1545) Pulse Rate: 88 (01/22 1545)  Labs: Recent Labs    06/19/17 1438  HGB 11.5*  HCT 34.4*  PLT 150  CREATININE 1.24*    CrCl cannot be calculated (Unknown ideal weight.).   Medical History: Past Medical History:  Diagnosis Date  . Anxiety   . Arthritis    back  . Atrial fibrillation ([place])   . Breast cancer ([place])    right breast cancer - 3 years ago  . Coronary artery disease   . Depression   . Diabetes mellitus without complication (HCC)    diet  . Dysrhythmia    afib  . Fibromyalgia   . Herniated disc   . Hyperlipemia   . Hypertension   . Iron deficiency anemia   . Lower back pain   . Lumbar stenosis    L4-5  . S/P radiation therapy 01/23/2013-03/06/2013   Right breast / 46 Gy in 23 fractions / Right breast boost / 14 Gy in 7 fractions  . Spondylolysis   . 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 Status post chemotherapy 11/04/2012 - 01/06/2013.   Docetaxel/Cytoxan Q 3 Weeks x 4 cycles  . Torn rotator cuff     Medications:  Pradaxa PTA dose 75mg  BID (last dose 06/18/17 at 2300) for afib  Assessment: 76yof presented to ED for SOB and chest pain. Pradaxa held and pharmacy to dose heparin awaiting cardiology consult. Chronic anemia with Hgb at 11.5. Plt WNL. Scr 1.24. No bleeding noted.  Goal of Therapy:  Heparin level 0.3-0.7 units/ml Monitor platelets by anticoagulation protocol: Yes   Plan:  Start heparin infusion at 800units/hr (previously therapeutic at prior admission on this dose) Continue to monitor H&H and platelets 8 hour heparin level Daily  heparin level and CBC  Heysel Lam 06/19/2017,4:20 PM

## 2017-06-19 NOTE — ED Notes (Signed)
Pt using a Purewick.

## 2017-06-19 NOTE — Progress Notes (Signed)
Patient arrived from ED with ED nurse. Not complaining of any pain, just SOB. Patient settled in on unit, cardiac monitoring applied and verified (a. Fib). Patient arrived with heparin drip going at 78mL. Patient A&O x4, safety measures in place. Updated on plan of care.

## 2017-06-19 NOTE — ED Provider Notes (Signed)
[facility] EMERGENCY DEPARTMENT Provider Note   CSN: [id number] Arrival date & time: 06/19/17  1350     History   Chief Complaint Chief Complaint  Patient presents with  . Chest Pain  . Shortness of Breath    HPI Diane [name] is a 77 y.o. female.  HPI  The patient is a 77 year old female, very pleasant, history of breast cancer, history of atrial fibrillation currently on Pradaxa, also has a history of diet-controlled diabetes.  She presents to the hospital with a complaint of shortness of breath.  She reports that she woke last night with left arm pain and throughout the day has had some chest discomfort as well as shortness of breath which is worsening.  She is orthopneic and has exertional dyspnea as well.  She denies any significant swelling of her legs but states she has chronic mild swelling of the left lower extremity which is stable.  There is no abdominal pain at this time she has no chest pain.  No fevers or chills, no coughing, no wheezing, no changes in vision or headache.  She denies any numbness or weakness.  She denies any prior history of obstructive disease that she is aware of and has never had congestive heart failure to her knowledge.  According to the medical record the patient underwent a heart catheterization on September 14, 2014, that showed an ejection fraction of 55%, he had 3 different vessels including the circumflex, the left anterior descending and the right coronary each with between 30 and 50% occlusions but no high grade occlusions.  Past Medical History:  Diagnosis Date  . Anxiety   . Arthritis    back  . Atrial fibrillation ([place])   . Breast cancer ([place])    right breast cancer - 3 years ago  . Coronary artery disease   . Depression   . Diabetes mellitus without complication (HCC)    diet  . Dysrhythmia    afib  . Fibromyalgia   . Herniated disc   . Hyperlipemia   . Hypertension   . Iron deficiency anemia   . Lower  back pain   . Lumbar stenosis    L4-5  . S/P radiation therapy 01/23/2013-03/06/2013   Right breast / 46 Gy in 23 fractions / Right breast boost / 14 Gy in 7 fractions  . Spondylolysis   . 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 Status post chemotherapy 11/04/2012 - 01/06/2013.   Docetaxel/Cytoxan Q 3 Weeks x 4 cycles  . Torn rotator cuff     Patient Active Problem List   Diagnosis Date Noted  . Chest pain 09/11/2014  . Hypokalemia 01/15/2014  . 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 Swelling of limb 01/23/2013  . Oropharyngeal candidiasis 01/13/2013  . Chemotherapy induced neutropenia ([place]) 01/13/2013  . UTI (urinary tract infection) 12/25/2012  . Cancer of central portion of right female breast ([place]) 09/06/2012  . Vulva cancer ([place]) 08/16/2011  . INSOMNIA 08/26/2010  . HEADACHE 08/26/2010  . TOBACCO USER 11/04/2009  . POSTMENOPAUSAL STATUS 11/04/2009  . DYSLIPIDEMIA 07/30/2009  . HAIR LOSS 07/30/2009  . ANEMIA 07/26/2009  . SINUSITIS, ACUTE 04/02/2009  . DIABETES MELLITUS, TYPE II 01/01/2009  . URINARY TRACT INFECTION SITE NOT SPECIFIED 11/26/2008  . BREAST PAIN, RIGHT 11/12/2008  . ANGIOEDEMA 10/05/2008  . CHEST PAIN, ATYPICAL 09/01/2008  . ALLERGIC RHINITIS 05/05/2008  . VAGINITIS 05/05/2008  . CAROTID BRUIT,  RIGHT 11/12/2007  . SHOULDER PAIN, LEFT, CHRONIC 08/09/2007  . 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 DEGENERATIVE DISC DISEASE, LUMBOSACRAL SPINE 08/09/2007  . HYPERTENSION, BENIGN ESSENTIAL 03/20/2007  . IRREGULAR HEART RATE 03/20/2007  . BRONCHITIS, ACUTE 03/20/2007  . DEPRESSION 02/27/2007  . MURMUR 02/27/2007    Past Surgical History:  Procedure Laterality Date  . ABDOMINAL HYSTERECTOMY    . BREAST BIOPSY Right 09/04/2012  . BREAST LUMPECTOMY Right 10/08/2012  . COLONOSCOPY    . DILATION AND CURETTAGE OF UTERUS    . LEFT HEART CATHETERIZATION WITH CORONARY ANGIOGRAM N/A 09/14/2014    Procedure: LEFT HEART CATHETERIZATION WITH CORONARY ANGIOGRAM;  Surgeon: [name], MD;  Location: [facility];  Service: Cardiovascular;  Laterality: N/A;  . OPEN REDUCTION INTERNAL FIXATION (ORIF) PROXIMAL PHALANX Right 01/04/2016   Procedure: OPEN TREATMENT OF RIGHT THUMB PROXIMAL PHALANX FRACTURE;  Surgeon: Milly Jakob, MD;  Location: [place];  Service: Orthopedics;  Laterality: Right;  . PARTIAL MASTECTOMY WITH NEEDLE LOCALIZATION AND AXILLARY SENTINEL LYMPH NODE BX Right 10/08/2012   Procedure: RIGHT PARTIAL MASTECTOMY WITH NEEDLE LOCALIZATION AND RIGHT AXILLARY SENTINEL LYMPH NODE BX;  Surgeon: Adin Hector, MD;  Location: [place];  Service: General;  Laterality: Right;  Injection of 1% Methylene Blue dye into right breast  . PORTACATH PLACEMENT Left 10/08/2012   Procedure: INSERTION PORT-A-CATH WITH ULTRASOUND GUIDANCE;  Surgeon: Adin Hector, MD;  Location: [place];  Service: General;  Laterality: Left;  Using 26F [place]  . Posterior Lumbar Arthrodesis, Pedicle screw fixation, Posterolateral Arthodesis  03/17/11  . SHOULDER ARTHROSCOPY WITH ROTATOR CUFF REPAIR AND SUBACROMIAL DECOMPRESSION  06/04/2012   Procedure: SHOULDER ARTHROSCOPY WITH ROTATOR CUFF REPAIR AND SUBACROMIAL DECOMPRESSION;  Surgeon: Nita Sells, MD;  Location: [place];  Service: Orthopedics;  Laterality: Left;  LEFT SHOULDER ARTHROSCOPY WITH ROTATOR CUFF REPAIR AND SUBACROMIAL DECOMPRESSION AND DISTAL CLAVICLE RESECTION  . TONSILLECTOMY    . TUBAL LIGATION    . VULVECTOMY PARTIAL    . Wide Local Excision  01/2010   Bilateral groin excisions, re-excision of vulva    OB History    No data available       Home Medications    Prior to Admission medications   Medication Sig Start Date End Date Taking? Authorizing Provider  albuterol (PROVENTIL HFA;VENTOLIN HFA) 108 (90 BASE) MCG/ACT inhaler Inhale 2 puffs into the lungs every 4 (four) hours as needed for  wheezing or shortness of breath. 05/12/15   Melony Overly, MD  amiodarone (PACERONE) 200 MG tablet Take 200 mg by mouth daily.  12/09/14   [provider]  dabigatran (PRADAXA) 75 MG CAPS Take 75 mg by mouth every 12 (twelve) hours.    [provider]  ferrous sulfate 325 (65 FE) MG tablet Take 325 mg by mouth daily with breakfast.    [provider]  FLUoxetine (PROZAC) 10 MG capsule Take 10 mg by mouth daily.    [provider]  gabapentin (NEURONTIN) 100 MG capsule 1 tab by mouth in morning, 1 tab by mouth mid-day, and 3 tabs by mouth at bedtime 03/05/13   Gwenlyn Found, Amy G, PA-C  HYDROcodone-acetaminophen (NORCO/VICODIN) 5-325 MG per tablet Take 1-2 tablets by mouth every 4 (four) hours as needed for pain. 10/08/12   [name], MD  losartan (COZAAR) 100 MG tablet Take 1 tablet (100 mg total) by mouth daily. 09/15/14   [name], MD  metFORMIN (GLUCOPHAGE-XR) 500 MG 24 hr tablet  Take 1 tablet (500 mg total) by mouth daily with breakfast. Total dose of 1000mg  daily 09/16/14   [name], MD  NITROSTAT 0.4 MG SL tablet Place 0.4 mg under the tongue every 5 (five) minutes as needed for chest pain.  10/14/12   [provider]  potassium chloride (K-DUR) 10 MEQ tablet Take 10 mEq by mouth daily.  09/04/14   [provider]  pravastatin (PRAVACHOL) 40 MG tablet Take 40 mg by mouth every evening.    [provider]  traZODone (DESYREL) 50 MG tablet Take 50 mg by mouth at bedtime.    [provider]  triamterene-hydrochlorothiazide (DYAZIDE) 37.5-25 MG per capsule Take 1 capsule by mouth daily.  08/10/14   [provider]    Family History Family History  Problem Relation Age of Onset  . Stroke Mother   . Alzheimer's disease Mother   . Heart attack Father   . Heart attack Brother     Social History Social History   Tobacco Use  . Smoking status: Former Smoker    Years: 50.00    Types: Cigarettes    Last  attempt to quit: 05/29/2009    Years since quitting: 8.0  . Smokeless tobacco: Never Used  Substance Use Topics  . Alcohol use: Yes    Comment: rare  . Drug use: No     Allergies   Shellfish allergy   Review of Systems Review of Systems  All other systems reviewed and are negative.    Physical Exam Updated Vital Signs BP (!) 152/69   Pulse 88   Temp 97.8 F (36.6 C) (Oral)   Resp (!) 24   SpO2 (!) 83%   Physical Exam  Constitutional: She appears well-developed and well-nourished. No distress.  HENT:  Head: Normocephalic and atraumatic.  Mouth/Throat: Oropharynx is clear and moist. No oropharyngeal exudate.  Eyes: Conjunctivae and EOM are normal. Pupils are equal, round, and reactive to light. Right eye exhibits no discharge. Left eye exhibits no discharge. No scleral icterus.  Neck: Normal range of motion. Neck supple. No JVD present. No thyromegaly present.  Cardiovascular: Normal rate, normal heart sounds and intact distal pulses. Exam reveals no gallop and no friction rub.  No murmur heard. Atrial fibrillation  Pulmonary/Chest: She is in respiratory distress. She has no wheezes. She has rales.  Abdominal: Soft. Bowel sounds are normal. She exhibits no distension and no mass. There is no tenderness.  Musculoskeletal: Normal range of motion. She exhibits no edema or tenderness.  Lymphadenopathy:    She has no cervical adenopathy.  Neurological: She is alert. Coordination normal.  Skin: Skin is warm and dry. No rash noted. No erythema.  Psychiatric: She has a normal mood and affect. Her behavior is normal.  Nursing note and vitals reviewed.    ED Treatments / Results  Labs (all labs ordered are listed, but only abnormal results are displayed) Labs Reviewed  BASIC METABOLIC PANEL - Abnormal; Notable for the following components:      Result Value   CO2 18 (*)    Glucose, Bld 118 (*)    Creatinine, Ser 1.24 (*)    Calcium 8.6 (*)    GFR calc non Af Amer 42 (*)     GFR calc Af Amer 48 (*)    All other components within normal limits  CBC - Abnormal; Notable for the following components:   Hemoglobin 11.5 (*)    HCT 34.4 (*)    RDW 18.6 (*)  All other components within normal limits  BRAIN NATRIURETIC PEPTIDE  I-STAT TROPONIN, ED    EKG  EKG Interpretation  Date/Time:  Tuesday June 19 2017 13:55:54 EST Ventricular Rate:  86 PR Interval:    QRS Duration: 98 QT Interval:  282 QTC Calculation: 337 R Axis:   78 Text Interpretation:  Atrial fibrillation Low voltage QRS Cannot rule out Anterior infarct , age undetermined Abnormal ECG since last tracing no significant change Confirmed by Noemi Chapel [phone]) on 06/19/2017 3:11:44 PM       Radiology Dg Chest 2 View  Result Date: 06/19/2017 CLINICAL DATA:  Significant increase in dyspnea last evening. EXAM: CHEST  2 VIEW COMPARISON:  09/07/2016 FINDINGS: Stable cardiomegaly with aortic atherosclerosis. Central pulmonary vascular congestion with bilateral airspace opacities and small bilateral pleural effusions are suspicious for stigmata of CHF. Superimposed pneumonia would be difficult to entirely exclude. Surgical clips project over the right breast. IMPRESSION: Stable cardiomegaly with aortic atherosclerosis. Central pulmonary vascular congestion and bilateral patchy airspace opacities with small bilateral pleural effusions are more likely to represent changes of CHF. Superimposed pneumonia would be difficult to entirely exclude. Electronically Signed   By: Ashley Royalty M.D.   On: 06/19/2017 15:23    Procedures .Critical Care Performed by: Noemi Chapel, MD Authorized by: Noemi Chapel, MD   Critical care provider statement:    Critical care time (minutes):  35   Critical care time was exclusive of:  Separately billable procedures and treating other patients and teaching time   Critical care was necessary to treat or prevent imminent or life-threatening deterioration of the following  conditions:  Cardiac failure   Critical care was time spent personally by me on the following activities:  Blood draw for specimens, development of treatment plan with patient or surrogate, discussions with consultants, evaluation of patient's response to treatment, examination of patient, obtaining history from patient or surrogate, ordering and performing treatments and interventions, ordering and review of laboratory studies, ordering and review of radiographic studies, pulse oximetry, re-evaluation of patient's condition and review of old charts   (including critical care time)  Medications Ordered in ED Medications  heparin bolus via infusion 4,000 Units (not administered)  heparin ADULT infusion 100 units/mL (25000 units/221mL sodium chloride 0.45%) (not administered)  furosemide (LASIX) injection 40 mg (not administered)  aspirin chewable tablet 324 mg (not administered)     Initial Impression / Assessment and Plan / ED Course  I have reviewed the triage vital signs and the nursing notes.  Pertinent labs & imaging results that were available during my care of the patient were reviewed by me and considered in my medical decision making (see chart for details).    X-ray shows that there is likely some bilateral congestive heart failure infiltrates, troponin is normal, metabolic panel shows creatinine better than baseline, no leukocytosis, normal troponin.  She has consulted with Dr. Terrence Dupont in the past, I will obtain cardiology consultation for this patient who likely needs to be admitted for new onset congestive heart failure or exacerbation.  Vital signs as below  Vitals:   06/19/17 1401  BP: (!) 142/86  Pulse: 80  Resp: (!) 25  Temp: 97.8 F (36.6 C)  TempSrc: Oral  SpO2: 91%   Discussed with the cardiologist at 3:50 PM, he has been kind enough to admit the patient to the hospital and request a heparin drip, I do suspect that she has unstable angina and likely acute congestive  heart failure.  She is critically  ill with cardiac failure and is requiring high level of care.  Final Clinical Impressions(s) / ED Diagnoses   Final diagnoses:  Unstable angina ([place])  Acute congestive heart failure, unspecified heart failure type (HCC)      Noemi Chapel, MD 06/19/17 1554

## 2017-06-20 ENCOUNTER — Encounter (HOSPITAL_COMMUNITY): Payer: Self-pay

## 2017-06-20 ENCOUNTER — Inpatient Hospital Stay (HOSPITAL_COMMUNITY): Payer: Medicare Other

## 2017-06-20 LAB — GLUCOSE, CAPILLARY
Glucose-Capillary: 125 mg/dL — ABNORMAL HIGH (ref 65–99)
Glucose-Capillary: 113 mg/dL — ABNORMAL HIGH (ref 65–99)
Glucose-Capillary: 105 mg/dL — ABNORMAL HIGH (ref 65–99)
Glucose-Capillary: 98 mg/dL (ref 65–99)

## 2017-06-20 LAB — TROPONIN I
Troponin I: <0.03 ng/mL (ref <0.03)
Troponin I: <0.03 ng/mL (ref <0.03)
Troponin I: <0.03 ng/mL (ref <0.03)

## 2017-06-20 LAB — CBC
HCT: 31 % — ABNORMAL LOW (ref 36.0–46.0)
Hemoglobin: 10.1 g/dL — ABNORMAL LOW (ref 12.0–15.0)
MCH: 26.3 pg (ref 26.0–34.0)
MCHC: 32.6 g/dL (ref 30.0–36.0)
MCV: 80.7 fL (ref 78.0–100.0)
Platelets: 151 10*3/uL (ref 150–400)
RBC: 3.84 MIL/uL — ABNORMAL LOW (ref 3.87–5.11)
RDW: 18.1 % — ABNORMAL HIGH (ref 11.5–15.5)
WBC: 9.1 10*3/uL (ref 4.0–10.5)

## 2017-06-20 LAB — BASIC METABOLIC PANEL
Anion gap: 13 (ref 5–15)
BUN: 15 mg/dL (ref 6–20)
CO2: 21 mmol/L — ABNORMAL LOW (ref 22–32)
Calcium: 8.4 mg/dL — ABNORMAL LOW (ref 8.9–10.3)
Chloride: 104 mmol/L (ref 101–111)
Creatinine, Ser: 1.33 mg/dL — ABNORMAL HIGH (ref 0.44–1.00)
GFR calc Af Amer: 44 mL/min — ABNORMAL LOW (ref >60)
GFR calc non Af Amer: 38 mL/min — ABNORMAL LOW (ref >60)
Glucose, Bld: 112 mg/dL — ABNORMAL HIGH (ref 65–99)
Potassium: 3.4 mmol/L — ABNORMAL LOW (ref 3.5–5.1)
Sodium: 138 mmol/L (ref 135–145)

## 2017-06-20 LAB — LIPID PANEL
Cholesterol: 133 mg/dL (ref 0–200)
HDL: 68 mg/dL (ref >40)
LDL Cholesterol: 59 mg/dL (ref 0–99)
Total CHOL/HDL Ratio: 2 RATIO
Triglycerides: 32 mg/dL (ref <150)
VLDL: 6 mg/dL (ref 0–40)

## 2017-06-20 LAB — ECHOCARDIOGRAM COMPLETE
Ao-asc: 28 cm
Area-P 1/2: 3.93 cm2
E decel time: 190 msec
FS: 37 % (ref 28–44)
Height: 67 in
IVS/LV PW RATIO, ED: 0.85
LA ID, A-P, ES: 51 mm
LA diam end sys: 51 mm
LA diam index: 2.41 cm/m2
LA vol A4C: 102 ml
LA vol index: 53.8 mL/m2
LA vol: 114 mL
LV PW d: 13 mm — AB (ref 0.6–1.1)
LVOT area: 3.14 cm2
LVOT diameter: 20 mm
MV Dec: 190
MV Peak grad: 9 mmHg
MV pk A vel: 47.7 m/s
MV pk E vel: 151 m/s
P 1/2 time: 56 ms
P 1/2 time: 959 ms
PV Reg grad dias: 7 mmHg
PV Reg vel dias: 128 cm/s
Reg peak vel: 324 cm/s
TAPSE: 18.8 mm
TR max vel: 324 cm/s
Weight: 3254.4 oz

## 2017-06-20 LAB — HEPARIN LEVEL (UNFRACTIONATED)
Heparin Unfractionated: 0.12 IU/mL — ABNORMAL LOW (ref 0.30–0.70)
Heparin Unfractionated: 0.15 IU/mL — ABNORMAL LOW (ref 0.30–0.70)
Heparin Unfractionated: 0.44 IU/mL (ref 0.30–0.70)

## 2017-06-20 LAB — HEMOGLOBIN A1C
Hgb A1c MFr Bld: 5.7 % — ABNORMAL HIGH (ref 4.8–5.6)
Mean Plasma Glucose: 116.89 mg/dL

## 2017-06-20 LAB — TSH: TSH: 1.495 u[IU]/mL (ref 0.350–4.500)

## 2017-06-20 LAB — MAGNESIUM: Magnesium: 1.8 mg/dL (ref 1.7–2.4)

## 2017-06-20 LAB — BRAIN NATRIURETIC PEPTIDE: B Natriuretic Peptide: 850.7 pg/mL — ABNORMAL HIGH (ref 0.0–100.0)

## 2017-06-20 MED ORDER — POTASSIUM CHLORIDE CRYS ER 20 MEQ PO TBCR
20.0000 meq | EXTENDED_RELEASE_TABLET | Freq: Two times a day (BID) | ORAL | Status: DC
  Administered 2017-06-20 – 2017-06-24 (×8): 20 meq via ORAL
  Filled 2017-06-20: qty 2
  Filled 2017-06-20: qty 1
  Filled 2017-06-20: qty 2
  Filled 2017-06-20 (×2): qty 1
  Filled 2017-06-20: qty 2
  Filled 2017-06-20 (×5): qty 1

## 2017-06-20 MED ORDER — HEPARIN BOLUS VIA INFUSION
2000.0000 [IU] | Freq: Once | INTRAVENOUS | Status: AC
  Administered 2017-06-20: 2000 [IU] via INTRAVENOUS
  Filled 2017-06-20: qty 2000

## 2017-06-20 MED ORDER — LEVALBUTEROL HCL 1.25 MG/0.5ML IN NEBU: 1.2500 mg | INHALATION_SOLUTION | Freq: Four times a day (QID) | RESPIRATORY_TRACT | Status: DC | PRN

## 2017-06-20 NOTE — Progress Notes (Signed)
  Echocardiogram 2D Echocardiogram has been performed.  Tiffany G Dance 06/20/2017, 3:02 PM

## 2017-06-20 NOTE — Progress Notes (Signed)
ANTICOAGULATION CONSULT NOTE- Follow up  Pharmacy Consult for Heparin Indication: atrial fibrillation and chest pain/ACS  Allergies  Allergen Reactions  . Shellfish Allergy Anaphylaxis    Patient Measurements: Height: 5\' 7"  (170.2 cm) Weight: 203 lb 6.4 oz (92.3 kg)(c scale) IBW/kg (Calculated) : 61.6 Heparin Dosing Weight: 83kg  Vital Signs: Temp: 98.1 F (36.7 C) (01/23 2019) Temp Source: Oral (01/23 2019) BP: 144/54 (01/23 2019) Pulse Rate: 61 (01/23 2019)  Labs: Recent Labs    06/19/17 1438 06/20/17 0039 06/20/17 0956 06/20/17 1942  HGB 11.5* 10.1*  --   --   HCT 34.4* 31.0*  --   --   PLT 150 151  --   --   HEPARINUNFRC  --  0.12* 0.15* 0.44  CREATININE 1.24* 1.33*  --   --   TROPONINI  --  <0.03  <0.03 <0.03  --     Estimated Creatinine Clearance: 43.3 mL/min (A) (by C-G formula based on SCr of 1.33 mg/dL (H)).   Medical History: Past Medical History:  Diagnosis Date  . Anxiety   . Arthritis    back  . Atrial fibrillation ([place])   . Breast cancer ([place])    right breast cancer - 3 years ago  . Coronary artery disease   . Depression   . Diabetes mellitus without complication (HCC)    diet  . Dysrhythmia    afib  . Fibromyalgia   . Herniated disc   . Hyperlipemia   . Hypertension   . Iron deficiency anemia   . Lower back pain   . Lumbar stenosis    L4-5  . S/P radiation therapy 01/23/2013-03/06/2013   Right breast / 46 Gy in 23 fractions / Right breast boost / 14 Gy in 7 fractions  . Spondylolysis   . 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 Status post chemotherapy 11/04/2012 - 01/06/2013.   Docetaxel/Cytoxan Q 3 Weeks x 4 cycles  . Torn rotator cuff     Medications:  Pradaxa PTA dose 75mg  BID (last dose 06/18/17 at 2300) for afib  Assessment: 76yof presented to ED on 06/19/17 for SOB and chest pain. Pradaxa held and pharmacy consulted to dose IV heparin. . Chronic anemia with Hgb at 11.5 on admit, down to 10.1  today. . Plt remains WNL. Scr 1.33.  Tonight's 8 hr heparin level  = 0.44 therapeutic on IV heparin drip 1300 units/hr. No bleeding noted.   Goal of Therapy:  Heparin level 0.3-0.7 units/ml Monitor platelets by anticoagulation protocol: Yes    Plan:  Continue IV  heparin 1300 units/hr Check 8 hr heparin level with AM labs 06/22/17. -Daily HL, CBC  Thank you for allowing pharmacy to be part of this patients care team.  Nicole Cella, [place] [phone] 06/20/2017,9:03 PM

## 2017-06-20 NOTE — Progress Notes (Signed)
Page to Dr Terrence Dupont to notify of Echo Completion.

## 2017-06-20 NOTE — Progress Notes (Signed)
[place] for Heparin Indication: atrial fibrillation and chest pain/ACS  Allergies  Allergen Reactions  . Shellfish Allergy Anaphylaxis    Patient Measurements: Height: 5\' 7"  (170.2 cm) Weight: 205 lb 11.2 oz (93.3 kg)(c scale) IBW/kg (Calculated) : 61.6 Heparin Dosing Weight: 83kg  Vital Signs: Temp: 97.7 F (36.5 C) (01/22 2150) Temp Source: Oral (01/22 2150) BP: 149/61 (01/23 0118) Pulse Rate: 62 (01/23 0118)  Labs: Recent Labs    06/19/17 1438 06/20/17 0039  HGB 11.5* 10.1*  HCT 34.4* 31.0*  PLT 150 151  HEPARINUNFRC  --  0.12*  CREATININE 1.24* 1.33*  TROPONINI  --  <0.03    Estimated Creatinine Clearance: 43.5 mL/min (A) (by C-G formula based on SCr of 1.33 mg/dL (H)).   Medical History: Past Medical History:  Diagnosis Date  . Anxiety   . Arthritis    back  . Atrial fibrillation ([place])   . Breast cancer ([place])    right breast cancer - 3 years ago  . Coronary artery disease   . Depression   . Diabetes mellitus without complication (HCC)    diet  . Dysrhythmia    afib  . Fibromyalgia   . Herniated disc   . Hyperlipemia   . Hypertension   . Iron deficiency anemia   . Lower back pain   . Lumbar stenosis    L4-5  . S/P radiation therapy 01/23/2013-03/06/2013   Right breast / 46 Gy in 23 fractions / Right breast boost / 14 Gy in 7 fractions  . Spondylolysis   . 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 Status post chemotherapy 11/04/2012 - 01/06/2013.   Docetaxel/Cytoxan Q 3 Weeks x 4 cycles  . Torn rotator cuff     Medications:  Pradaxa PTA dose 75mg  BID (last dose 06/18/17 at 2300) for afib  Assessment: 76yof presented to ED for SOB and chest pain. Pradaxa held and pharmacy to dose heparin awaiting cardiology consult. Chronic anemia with Hgb at 11.5. Plt WNL. Scr 1.24. No bleeding noted.  Initial heparin level is SUBtherapeutic.  Goal of Therapy:  Heparin level 0.3-0.7  units/ml Monitor platelets by anticoagulation protocol: Yes    Plan:  -Increase heparin to 1000 units/hr -Daily HL, CBC -Check level in 8 hours  Harvel Quale 06/20/2017,2:22 AM

## 2017-06-20 NOTE — Progress Notes (Signed)
[facility] [facility] for Heparin Indication: atrial fibrillation and chest pain/ACS  Allergies  Allergen Reactions  . Shellfish Allergy Anaphylaxis    Patient Measurements: Height: 5\' 7"  (170.2 cm) Weight: 203 lb 6.4 oz (92.3 kg)(c scale) IBW/kg (Calculated) : 61.6 Heparin Dosing Weight: 83kg  Vital Signs: Temp: 98.2 F (36.8 C) (01/23 1121) Temp Source: Oral (01/23 1121) BP: 154/67 (01/23 1121) Pulse Rate: 68 (01/23 1121)  Labs: Recent Labs    06/19/17 1438 06/20/17 0039 06/20/17 0956  HGB 11.5* 10.1*  --   HCT 34.4* 31.0*  --   PLT 150 151  --   HEPARINUNFRC  --  0.12* 0.15*  CREATININE 1.24* 1.33*  --   TROPONINI  --  <0.03  <0.03 <0.03    Estimated Creatinine Clearance: 43.3 mL/min (A) (by C-G formula based on SCr of 1.33 mg/dL (H)).   Medical History: Past Medical History:  Diagnosis Date  . Anxiety   . Arthritis    back  . Atrial fibrillation ([place])   . Breast cancer ([place])    right breast cancer - 3 years ago  . Coronary artery disease   . Depression   . Diabetes mellitus without complication (HCC)    diet  . Dysrhythmia    afib  . Fibromyalgia   . Herniated disc   . Hyperlipemia   . Hypertension   . Iron deficiency anemia   . Lower back pain   . Lumbar stenosis    L4-5  . S/P radiation therapy 01/23/2013-03/06/2013   Right breast / 46 Gy in 23 fractions / Right breast boost / 14 Gy in 7 fractions  . Spondylolysis   . 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 Status post chemotherapy 11/04/2012 - 01/06/2013.   Docetaxel/Cytoxan Q 3 Weeks x 4 cycles  . Torn rotator cuff     Medications:  Pradaxa PTA dose 75mg  BID (last dose 06/18/17 at 2300) for afib  Assessment: 76yof presented to ED for SOB and chest pain. Pradaxa held and pharmacy to dose heparin awaiting cardiology consult. Chronic anemia with Hgb at 11.5. Plt WNL. Scr 1.24. No bleeding noted.  Heparin level subtherapeutic  Goal  of Therapy:  Heparin level 0.3-0.7 units/ml Monitor platelets by anticoagulation protocol: Yes    Plan:  -Heparin 200 units iv bolus x 1 -Increase heparin to 1300 units/hr -Daily HL, CBC -Check level in 6 hours  Thank you Anette Guarneri, PharmD [phone] 06/20/2017,12:56 PM

## 2017-06-20 NOTE — Progress Notes (Signed)
Pt requests all meds together at [place]

## 2017-06-20 NOTE — Progress Notes (Signed)
Subjective:  Patient denies any chest pain.  Still complains of shortness of breath associated with coughing.  Denies fever.  Objective:  Vital Signs in the last 24 hours: Temp:  [97.7 F (36.5 C)-99.1 F (37.3 C)] 99.1 F (37.3 C) (01/23 0751) Pulse Rate:  [53-102] 72 (01/23 0751) Resp:  [21-32] 21 (01/22 2150) BP: (130-180)/(44-86) 153/62 (01/23 0751) SpO2:  [83 %-100 %] 93 % (01/23 0751) Weight:  [92.3 kg (203 lb 6.4 oz)-97.1 kg (214 lb)] 92.3 kg (203 lb 6.4 oz) (01/23 0606)  Intake/Output from previous day: 01/22 0701 - 01/23 0700 In: 474.2 [P.O.:240; I.V.:184.2; IV Piggyback:50] Out: 1025 [Urine:1025] Intake/Output from this shift: Total I/O In: 271.3 [P.O.:240; I.V.:31.3] Out: -   Physical Exam: Neck: no adenopathy, no carotid bruit, no JVD and supple, symmetrical, trachea midline Lungs: bibasilar decreased breath sounds with rhonchi and rales noted Heart: irregularly irregular rhythm, S1, S2 normal and 2/6 systolic murmur and S3 gallop noted Abdomen: soft, non-tender; bowel sounds normal; no masses,  no organomegaly Extremities: extremities normal, atraumatic, no cyanosis or edema  Lab Results: Recent Labs    06/19/17 1438 06/20/17 0039  WBC 9.4 9.1  HGB 11.5* 10.1*  PLT 150 151   Recent Labs    06/19/17 1438 06/20/17 0039  NA 136 138  K 3.8 3.4*  CL 104 104  CO2 18* 21*  GLUCOSE 118* 112*  BUN 12 15  CREATININE 1.24* 1.33*   Recent Labs    06/20/17 0039  TROPONINI <0.03  <0.03   Hepatic Function Panel No results for input(s): PROT, ALBUMIN, AST, ALT, ALKPHOS, BILITOT, BILIDIR, IBILI in the last 72 hours. Recent Labs    06/20/17 0324  CHOL 133   No results for input(s): PROTIME in the last 72 hours.  Imaging: Imaging results have been reviewed and Dg Chest 2 View  Result Date: 06/19/2017 CLINICAL DATA:  Significant increase in dyspnea last evening. EXAM: CHEST  2 VIEW COMPARISON:  09/07/2016 FINDINGS: Stable cardiomegaly with aortic  atherosclerosis. Central pulmonary vascular congestion with bilateral airspace opacities and small bilateral pleural effusions are suspicious for stigmata of CHF. Superimposed pneumonia would be difficult to entirely exclude. Surgical clips project over the right breast. IMPRESSION: Stable cardiomegaly with aortic atherosclerosis. Central pulmonary vascular congestion and bilateral patchy airspace opacities with small bilateral pleural effusions are more likely to represent changes of CHF. Superimposed pneumonia would be difficult to entirely exclude. Electronically Signed   By: Ashley Royalty M.D.   On: 06/19/2017 15:23    Cardiac Studies:  Assessment/Plan:  Decompensated congestive heart failure secondary to preserved LV systolic function and rule out ischemia/rule out bilateral pneumonia Valvular heart disease Unstable angina rule out MI Multivessel moderate CAD Chronic atrial fibrillation chadsvasc score of 7 on chronic anticoagulation Hypertension Diabetes mellitus Chronic kidney disease History of CVA History of breast cancer Anemia of chronic disease Plan Continue present management. Schedule for 2-D echo to check LV systolic function, check for MR Labs in a.m. monitor weight daily.  Strict I and O's  LOS: 1 day    [name] 06/20/2017, 10:14 AM

## 2017-06-21 ENCOUNTER — Other Ambulatory Visit: Payer: Self-pay

## 2017-06-21 LAB — CBC
HCT: 30 % — ABNORMAL LOW (ref 36.0–46.0)
Hemoglobin: 9.7 g/dL — ABNORMAL LOW (ref 12.0–15.0)
MCH: 26.2 pg (ref 26.0–34.0)
MCHC: 32.3 g/dL (ref 30.0–36.0)
MCV: 81.1 fL (ref 78.0–100.0)
Platelets: 152 10*3/uL (ref 150–400)
RBC: 3.7 MIL/uL — ABNORMAL LOW (ref 3.87–5.11)
RDW: 18.3 % — ABNORMAL HIGH (ref 11.5–15.5)
WBC: 8.2 10*3/uL (ref 4.0–10.5)

## 2017-06-21 LAB — BASIC METABOLIC PANEL
Anion gap: 12 (ref 5–15)
BUN: 18 mg/dL (ref 6–20)
CO2: 22 mmol/L (ref 22–32)
Calcium: 8.2 mg/dL — ABNORMAL LOW (ref 8.9–10.3)
Chloride: 103 mmol/L (ref 101–111)
Creatinine, Ser: 1.66 mg/dL — ABNORMAL HIGH (ref 0.44–1.00)
GFR calc Af Amer: 34 mL/min — ABNORMAL LOW (ref >60)
GFR calc non Af Amer: 29 mL/min — ABNORMAL LOW (ref >60)
Glucose, Bld: 98 mg/dL (ref 65–99)
Potassium: 3.7 mmol/L (ref 3.5–5.1)
Sodium: 137 mmol/L (ref 135–145)

## 2017-06-21 LAB — GLUCOSE, CAPILLARY
Glucose-Capillary: 96 mg/dL (ref 65–99)
Glucose-Capillary: 85 mg/dL (ref 65–99)
Glucose-Capillary: 93 mg/dL (ref 65–99)
Glucose-Capillary: 96 mg/dL (ref 65–99)

## 2017-06-21 LAB — HEPARIN LEVEL (UNFRACTIONATED): Heparin Unfractionated: 0.45 IU/mL (ref 0.30–0.70)

## 2017-06-21 LAB — BRAIN NATRIURETIC PEPTIDE: B Natriuretic Peptide: 382.4 pg/mL — ABNORMAL HIGH (ref 0.0–100.0)

## 2017-06-21 LAB — MAGNESIUM: Magnesium: 2 mg/dL (ref 1.7–2.4)

## 2017-06-21 MED ORDER — LOSARTAN POTASSIUM 50 MG PO TABS
100.0000 mg | ORAL_TABLET | Freq: Every day | ORAL | Status: DC
  Administered 2017-06-21 – 2017-06-24 (×4): 100 mg via ORAL
  Filled 2017-06-21 (×4): qty 2

## 2017-06-21 MED ORDER — ACETAMINOPHEN 325 MG PO TABS
650.0000 mg | ORAL_TABLET | Freq: Four times a day (QID) | ORAL | Status: DC | PRN
  Administered 2017-06-21 – 2017-06-22 (×3): 650 mg via ORAL
  Filled 2017-06-21 (×3): qty 2

## 2017-06-21 NOTE — Progress Notes (Signed)
Subjective:  Patient denies any chest pain.  States breathing is gradually improving.  Had episode of A. Fib with slow vent corresponds heart rate in 30s to 40s.  Beta blocker has been DC'd.  Objective:  Vital Signs in the last 24 hours: Temp:  [97.8 F (36.6 C)-98.2 F (36.8 C)] 97.8 F (36.6 C) (01/24 1135) Pulse Rate:  [48-61] 48 (01/24 1135) Resp:  [18-20] 18 (01/24 1135) BP: (116-144)/(52-59) 116/52 (01/24 1135) SpO2:  [93 %-97 %] 97 % (01/24 1135) Weight:  [90.9 kg (200 lb 4.8 oz)] 90.9 kg (200 lb 4.8 oz) (01/24 0616)  Intake/Output from previous day: 01/23 0701 - 01/24 0700 In: 1315.1 [P.O.:960; I.V.:305.1; IV Piggyback:50] Out: 2700 [Urine:2700] Intake/Output from this shift: Total I/O In: 458.9 [P.O.:390; I.V.:68.9] Out: -   Physical Exam: Neck: no adenopathy, no carotid bruit, no JVD and supple, symmetrical, trachea midline Lungs: decreased breath sounds at bases with occasional rhonchi and rales Heart: irregularly irregular rhythm, S1, S2 normal and 2/6 systolic murmur noted Abdomen: soft, non-tender; bowel sounds normal; no masses,  no organomegaly Extremities: extremities normal, atraumatic, no cyanosis or edema  Lab Results: Recent Labs    06/20/17 0039 06/21/17 0607  WBC 9.1 8.2  HGB 10.1* 9.7*  PLT 151 152   Recent Labs    06/20/17 0039 06/21/17 0607  NA 138 137  K 3.4* 3.7  CL 104 103  CO2 21* 22  GLUCOSE 112* 98  BUN 15 18  CREATININE 1.33* 1.66*   Recent Labs    06/20/17 0039 06/20/17 0956  TROPONINI <0.03  <0.03 <0.03   Hepatic Function Panel No results for input(s): PROT, ALBUMIN, AST, ALT, ALKPHOS, BILITOT, BILIDIR, IBILI in the last 72 hours. Recent Labs    06/20/17 0324  CHOL 133   No results for input(s): PROTIME in the last 72 hours.  Imaging: Imaging results have been reviewed and No results found.  Cardiac Studies:  Assessment/Plan:  Decompensated congestive heart failure secondary to preserved LV systolic function  and rule out ischemia/rule out bilateral pneumonia Valvular heart disease Unstable angina rule out MI Multivessel moderate CAD Chronic atrial fibrillation chadsvascscore of 7 on chronic anticoagulation Hypertension Diabetes mellitus Chronic kidney disease History of CVA History of breast cancer Anemia of chronic disease Plan DC metoprolol tartrate. Check labs and chest x-ray in a.m.   LOS: 2 days    [name] 06/21/2017, 5:38 PM

## 2017-06-21 NOTE — Plan of Care (Signed)
  Education: Knowledge of General Education information will improve 06/21/2017 1110 - Progressing by Lavonna Rua, RN   Health Behavior/Discharge Planning: Ability to manage health-related needs will improve 06/21/2017 1110 - Progressing by Lavonna Rua, RN   Clinical Measurements: Ability to maintain clinical measurements within normal limits will improve 06/21/2017 1110 - Progressing by Lavonna Rua, RN Will remain free from infection 06/21/2017 1110 - Progressing by Lavonna Rua, RN Diagnostic test results will improve 06/21/2017 1110 - Progressing by Lavonna Rua, RN Respiratory complications will improve 06/21/2017 1110 - Progressing by Lavonna Rua, RN Cardiovascular complication will be avoided 06/21/2017 1110 - Progressing by Lavonna Rua, RN   Activity: Risk for activity intolerance will decrease 06/21/2017 1110 - Progressing by Lavonna Rua, RN   Nutrition: Adequate nutrition will be maintained 06/21/2017 1110 - Progressing by Lavonna Rua, RN   Coping: Level of anxiety will decrease 06/21/2017 1110 - Progressing by Lavonna Rua, RN   Elimination: Will not experience complications related to bowel motility 06/21/2017 1110 - Progressing by Lavonna Rua, RN Will not experience complications related to urinary retention 06/21/2017 1110 - Progressing by Lavonna Rua, RN   Pain Managment: General experience of comfort will improve 06/21/2017 1110 - Progressing by Lavonna Rua, RN   Skin Integrity: Risk for impaired skin integrity will decrease 06/21/2017 1110 - Progressing by Lavonna Rua, RN

## 2017-06-21 NOTE — Progress Notes (Signed)
Notified by tele that patient's HR hs dipped to 39. Patient is awake, resting in bed, offers no c/o. Will make MD aware.

## 2017-06-21 NOTE — Progress Notes (Signed)
Call rec'd back from Dr. Zenia Resides nurse (as he is in the cath lab) . She will inform physician of pt's brady status. Pt asymptomatic, resting in bed, offers no c/o.

## 2017-06-21 NOTE — Progress Notes (Signed)
ANTICOAGULATION CONSULT NOTE- Follow up  Pharmacy Consult for Heparin Indication: atrial fibrillation and chest pain/ACS  Allergies  Allergen Reactions  . Shellfish Allergy Anaphylaxis    Patient Measurements: Height: 5\' 7"  (170.2 cm) Weight: 200 lb 4.8 oz (90.9 kg) IBW/kg (Calculated) : 61.6 Heparin Dosing Weight: 83kg  Vital Signs: Temp: 98.2 F (36.8 C) (01/24 0616) Temp Source: Oral (01/24 0616) BP: 128/59 (01/24 0616) Pulse Rate: 59 (01/24 0616)  Labs: Recent Labs    06/19/17 1438  06/20/17 0039 06/20/17 0956 06/20/17 1942 06/21/17 0555 06/21/17 0607  HGB 11.5*  --  10.1*  --   --   --  9.7*  HCT 34.4*  --  31.0*  --   --   --  30.0*  PLT 150  --  151  --   --   --  152  HEPARINUNFRC  --    < > 0.12* 0.15* 0.44 0.45  --   CREATININE 1.24*  --  1.33*  --   --   --  1.66*  TROPONINI  --   --  <0.03  <0.03 <0.03  --   --   --    < > = values in this interval not displayed.    Estimated Creatinine Clearance: 34.4 mL/min (A) (by C-G formula based on SCr of 1.66 mg/dL (H)).   Medical History: Past Medical History:  Diagnosis Date  . Anxiety   . Arthritis    back  . Atrial fibrillation ([place])   . Breast cancer ([place])    right breast cancer - 3 years ago  . Coronary artery disease   . Depression   . Diabetes mellitus without complication ([facility])    diet  . Dysrhythmia    afib  . Fibromyalgia   . Herniated disc   . Hyperlipemia   . Hypertension   . Iron deficiency anemia   . Lower back pain   . Lumbar stenosis    L4-5  . S/P radiation therapy 01/23/2013-03/06/2013   Right breast / 46 Gy in 23 fractions / Right breast boost / 14 Gy in 7 fractions  . Spondylolysis   . 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 Status post chemotherapy 11/04/2012 - 01/06/2013.   Docetaxel/Cytoxan Q 3 Weeks x 4 cycles  . Torn rotator cuff     Medications:  Pradaxa PTA dose 75mg  BID (last dose 06/18/17 at 2300) for afib  Assessment: 76yof  presented to ED on 06/19/17 for SOB and chest pain. Pradaxa held and pharmacy consulted to dose IV heparin. .   Heparin level therapeutic this AM  Goal of Therapy:  Heparin level 0.3-0.7 units/ml Monitor platelets by anticoagulation protocol: Yes    Plan:  Continue IV  heparin 1300 units/hr Daily heparin level, CBC  Thank you Anette Guarneri, PharmD [phone] 06/21/2017,8:50 AM

## 2017-06-21 NOTE — Progress Notes (Signed)
12L EKG this am showing sinus bradycardia. Will endorsed it to the incoming RN.

## 2017-06-22 ENCOUNTER — Inpatient Hospital Stay (HOSPITAL_COMMUNITY): Payer: Medicare Other

## 2017-06-22 LAB — BASIC METABOLIC PANEL
Anion gap: 11 (ref 5–15)
BUN: 24 mg/dL — ABNORMAL HIGH (ref 6–20)
CO2: 23 mmol/L (ref 22–32)
Calcium: 8.9 mg/dL (ref 8.9–10.3)
Chloride: 105 mmol/L (ref 101–111)
Creatinine, Ser: 1.69 mg/dL — ABNORMAL HIGH (ref 0.44–1.00)
GFR calc Af Amer: 33 mL/min — ABNORMAL LOW (ref >60)
GFR calc non Af Amer: 29 mL/min — ABNORMAL LOW (ref >60)
Glucose, Bld: 98 mg/dL (ref 65–99)
Potassium: 3.8 mmol/L (ref 3.5–5.1)
Sodium: 139 mmol/L (ref 135–145)

## 2017-06-22 LAB — CBC
HCT: 32.5 % — ABNORMAL LOW (ref 36.0–46.0)
Hemoglobin: 10.6 g/dL — ABNORMAL LOW (ref 12.0–15.0)
MCH: 26.4 pg (ref 26.0–34.0)
MCHC: 32.6 g/dL (ref 30.0–36.0)
MCV: 81 fL (ref 78.0–100.0)
Platelets: 173 10*3/uL (ref 150–400)
RBC: 4.01 MIL/uL (ref 3.87–5.11)
RDW: 18.2 % — ABNORMAL HIGH (ref 11.5–15.5)
WBC: 5.6 10*3/uL (ref 4.0–10.5)

## 2017-06-22 LAB — GLUCOSE, CAPILLARY
Glucose-Capillary: 100 mg/dL — ABNORMAL HIGH (ref 65–99)
Glucose-Capillary: 80 mg/dL (ref 65–99)
Glucose-Capillary: 96 mg/dL (ref 65–99)
Glucose-Capillary: 117 mg/dL — ABNORMAL HIGH (ref 65–99)

## 2017-06-22 LAB — BRAIN NATRIURETIC PEPTIDE: B Natriuretic Peptide: 247.1 pg/mL — ABNORMAL HIGH (ref 0.0–100.0)

## 2017-06-22 LAB — HEPARIN LEVEL (UNFRACTIONATED): Heparin Unfractionated: 0.57 IU/mL (ref 0.30–0.70)

## 2017-06-22 NOTE — Progress Notes (Signed)
Subjective:  No further chest pains to his breathing has markedly improved.  Remains in A. Fib with slow ventricular response, heart rate slowly improving  Objective:  Vital Signs in the last 24 hours: Temp:  [97.8 F (36.6 C)-98.3 F (36.8 C)] 98 F (36.7 C) (01/25 1554) Pulse Rate:  [47-68] 47 (01/25 1554) Resp:  [14-19] 14 (01/25 1554) BP: (114-142)/(50-75) 130/58 (01/25 1554) SpO2:  [97 %-100 %] 98 % (01/25 1554) Weight:  [91.5 kg (201 lb 12.8 oz)] 91.5 kg (201 lb 12.8 oz) (01/25 0507)  Intake/Output from previous day: 01/24 0701 - 01/25 0700 In: 1112 [P.O.:750; I.V.:312; IV Piggyback:50] Out: -  Intake/Output from this shift: Total I/O In: 837 [P.O.:720; I.V.:117] Out: 600 [Urine:600]  Physical Exam: Neck: no adenopathy, no carotid bruit, no JVD and supple, symmetrical, trachea midline Lungs: clear to auscultation bilaterally Heart: irregularly irregular rhythm, S1, S2 normal and 2/6 systolic murmur noted Abdomen: soft, non-tender; bowel sounds normal; no masses,  no organomegaly Extremities: extremities normal, atraumatic, no cyanosis or edema  Lab Results: Recent Labs    06/21/17 0607 06/22/17 0622  WBC 8.2 5.6  HGB 9.7* 10.6*  PLT 152 173   Recent Labs    06/21/17 0607 06/22/17 0622  NA 137 139  K 3.7 3.8  CL 103 105  CO2 22 23  GLUCOSE 98 98  BUN 18 24*  CREATININE 1.66* 1.69*   Recent Labs    06/20/17 0039 06/20/17 0956  TROPONINI <0.03  <0.03 <0.03   Hepatic Function Panel No results for input(s): PROT, ALBUMIN, AST, ALT, ALKPHOS, BILITOT, BILIDIR, IBILI in the last 72 hours. Recent Labs    06/20/17 0324  CHOL 133   No results for input(s): PROTIME in the last 72 hours.  Imaging: Imaging results have been reviewed and Dg Chest 2 View  Result Date: 06/22/2017 CLINICAL DATA:  Shortness of breath, CHF. EXAM: CHEST  2 VIEW COMPARISON:  06/19/2017 FINDINGS: Cardiomegaly again noted. Decreased pulmonary vascular congestion and near complete  resolution of bilateral interstitial and airspace opacities noted. Mild bibasilar atelectasis again noted. No pleural effusion or pneumothorax. IMPRESSION: Near-complete resolution of bilateral interstitial/airspace opacities/edema. Cardiomegaly and mild pulmonary vascular congestion persists. Electronically Signed   By: Margarette Canada M.D.   On: 06/22/2017 08:43    Cardiac Studies:  Assessment/Plan:  compensated congestive heart failure secondary to preserved LV systolic function and rule out ischemia/rule out bilateral pneumonia Valvular heart disease Unstable angina rule out MI Multivessel moderate CAD Chronic atrial fibrillation chadsvascscore of 7 on chronic anticoagulation Hypertension Diabetes mellitus Chronic kidney disease History of CVA History of breast cancer Anemia of chronic disease Plan Continue present management. Schedule for Lexiscan Myoview in a.m. Dr.Kadakia on call for weekend   LOS: 3 days    [name] 06/22/2017, 4:01 PM

## 2017-06-22 NOTE — Progress Notes (Signed)
ANTICOAGULATION CONSULT NOTE- Follow up  Pharmacy Consult for Heparin Indication: atrial fibrillation and chest pain/ACS  Allergies  Allergen Reactions  . Shellfish Allergy Anaphylaxis   Labs: Recent Labs    06/20/17 0039 06/20/17 0956 06/20/17 1942 06/21/17 0555 06/21/17 0607 06/22/17 0622  HGB 10.1*  --   --   --  9.7* 10.6*  HCT 31.0*  --   --   --  30.0* 32.5*  PLT 151  --   --   --  152 173  HEPARINUNFRC 0.12* 0.15* 0.44 0.45  --  0.57  CREATININE 1.33*  --   --   --  1.66* 1.69*  TROPONINI <0.03  <0.03 <0.03  --   --   --   --     Estimated Creatinine Clearance: 33.9 mL/min (A) (by C-G formula based on SCr of 1.69 mg/dL (H)).  Medications:  Pradaxa PTA dose 75mg  BID (last dose 06/18/17 at 2300) for afib  Assessment: 76yof presented to ED on 06/19/17 for SOB and chest pain. Pradaxa held and pharmacy consulted to dose IV heparin. .   Heparin level therapeutic this AM  Goal of Therapy:  Heparin level 0.3-0.7 units/ml Monitor platelets by anticoagulation protocol: Yes   Plan:  Continue IV  heparin 1300 units/hr Daily heparin level, CBC  Switch back to Pradaxa? Continues on metoprolol  Thank you Anette Guarneri, PharmD [phone] 06/22/2017,10:00 AM

## 2017-06-22 NOTE — Care Management Important Message (Signed)
Important Message  Patient Details  Name: Diane [name] MRN: [id number] Date of Birth: 1940-11-09   Medicare Important Message Given:  Yes    Carles Collet, RN 06/22/2017, 11:33 AM

## 2017-06-23 ENCOUNTER — Inpatient Hospital Stay (HOSPITAL_COMMUNITY): Payer: Medicare Other

## 2017-06-23 LAB — CBC
HCT: 31.8 % — ABNORMAL LOW (ref 36.0–46.0)
Hemoglobin: 10.4 g/dL — ABNORMAL LOW (ref 12.0–15.0)
MCH: 26.3 pg (ref 26.0–34.0)
MCHC: 32.7 g/dL (ref 30.0–36.0)
MCV: 80.5 fL (ref 78.0–100.0)
Platelets: 187 10*3/uL (ref 150–400)
RBC: 3.95 MIL/uL (ref 3.87–5.11)
RDW: 18.1 % — ABNORMAL HIGH (ref 11.5–15.5)
WBC: 5.8 10*3/uL (ref 4.0–10.5)

## 2017-06-23 LAB — HEPARIN LEVEL (UNFRACTIONATED): Heparin Unfractionated: 0.39 IU/mL (ref 0.30–0.70)

## 2017-06-23 LAB — GLUCOSE, CAPILLARY
Glucose-Capillary: 111 mg/dL — ABNORMAL HIGH (ref 65–99)
Glucose-Capillary: 126 mg/dL — ABNORMAL HIGH (ref 65–99)
Glucose-Capillary: 117 mg/dL — ABNORMAL HIGH (ref 65–99)
Glucose-Capillary: 128 mg/dL — ABNORMAL HIGH (ref 65–99)

## 2017-06-23 MED ORDER — TECHNETIUM TC 99M TETROFOSMIN IV KIT
30.0000 | PACK | Freq: Once | INTRAVENOUS | Status: AC | PRN
  Administered 2017-06-23: 30 via INTRAVENOUS

## 2017-06-23 MED ORDER — TECHNETIUM TC 99M TETROFOSMIN IV KIT
10.0000 | PACK | Freq: Once | INTRAVENOUS | Status: AC | PRN
  Administered 2017-06-23: 10 via INTRAVENOUS

## 2017-06-23 MED ORDER — METOPROLOL TARTRATE 12.5 MG HALF TABLET: 12.5000 mg | ORAL_TABLET | Freq: Every day | ORAL | Status: DC

## 2017-06-23 MED ORDER — REGADENOSON 0.4 MG/5ML IV SOLN
0.4000 mg | Freq: Once | INTRAVENOUS | Status: AC
  Administered 2017-06-23: 0.4 mg via INTRAVENOUS
  Filled 2017-06-23: qty 5

## 2017-06-23 MED ORDER — REGADENOSON 0.4 MG/5ML IV SOLN
INTRAVENOUS | Status: AC
  Filled 2017-06-23: qty 5

## 2017-06-23 NOTE — Progress Notes (Signed)
ANTICOAGULATION CONSULT NOTE- Follow up  Pharmacy Consult for Heparin Indication: atrial fibrillation and chest pain/ACS  Allergies  Allergen Reactions  . Shellfish Allergy Anaphylaxis   Labs: Recent Labs    06/21/17 0555  06/21/17 0607 06/22/17 0622 06/23/17 0722  HGB  --    < > 9.7* 10.6* 10.4*  HCT  --   --  30.0* 32.5* 31.8*  PLT  --   --  152 173 187  HEPARINUNFRC 0.45  --   --  0.57 0.39  CREATININE  --   --  1.66* 1.69*  --    < > = values in this interval not displayed.    Estimated Creatinine Clearance: 33.9 mL/min (A) (by C-G formula based on SCr of 1.69 mg/dL (H)).  Medications:  Pradaxa PTA dose 75mg  BID (last dose 06/18/17 at 2300) for afib  Assessment: 76yof presented to ED on 06/19/17 for SOB and chest pain. Pradaxa held and pharmacy consulted to dose IV heparin. Plans noted for lexiscan myoview today -heparin level is at goal   Goal of Therapy:  Heparin level 0.3-0.7 units/ml Monitor platelets by anticoagulation protocol: Yes   Plan:  Continue IV  heparin 1300 units/hr Daily heparin level, CBC  Hildred Laser, Pharm D 06/23/2017 10:48 AM

## 2017-06-23 NOTE — Plan of Care (Signed)
  Completed/Met Education: Knowledge of General Education information will improve 06/23/2017 1353 - Completed/Met by Alonna Buckler, RN Clinical Measurements: Ability to maintain clinical measurements within normal limits will improve 06/23/2017 1353 - Completed/Met by Alonna Buckler, RN Will remain free from infection 06/23/2017 1353 - Completed/Met by Alonna Buckler, RN Diagnostic test results will improve 06/23/2017 1353 - Completed/Met by Alonna Buckler, RN Respiratory complications will improve 06/23/2017 1353 - Completed/Met by Alonna Buckler, RN Activity: Risk for activity intolerance will decrease 06/23/2017 1353 - Completed/Met by Alonna Buckler, RN Nutrition: Adequate nutrition will be maintained 06/23/2017 1353 - Completed/Met by Alonna Buckler, RN Coping: Level of anxiety will decrease 06/23/2017 1353 - Completed/Met by Alonna Buckler, RN Elimination: Will not experience complications related to bowel motility 06/23/2017 1353 - Completed/Met by Alonna Buckler, RN Will not experience complications related to urinary retention 06/23/2017 1353 - Completed/Met by Alonna Buckler, RN Pain Managment: General experience of comfort will improve 06/23/2017 1353 - Completed/Met by Alonna Buckler, RN Skin Integrity: Risk for impaired skin integrity will decrease 06/23/2017 1353 - Completed/Met by Alonna Buckler, RN

## 2017-06-23 NOTE — Progress Notes (Signed)
Patient resting in bed and HR dropped to 44.  Patient denies chest pain and any other pain.  Dr. Doylene Canard in nursing station when heart rate dropped.  He informed patient that she will not be discharged home today and will continue to have heart monitored.  Patient updated on plan of care.

## 2017-06-23 NOTE — Progress Notes (Addendum)
Patient transported to Nuclear Medicine for exam.  Patient updated on plan of care.  Patient's fall risk score = 18.  Patientt stated she fell outside of her home x 3 prior to admission to hospital.  She stated she has been getting up by herself to go to bathroom for past 2 days and refuses the bed alarm this morning.  Educated patient to sit with legs dangling on side of bed for a couple of minutes when she goes from lying and before standing.  If she feels dizzy, asked her to stay sitting and call us to assist her with ambulation.  She stated understanding.

## 2017-06-23 NOTE — Progress Notes (Signed)
Ref: Lajean Manes, MD   Subjective:  Feeling better. Passed nuclear stress test with no reversible ischemia. Heart rate in 40's. VS stable.  Objective:  Vital Signs in the last 24 hours: Temp:  [97.6 F (36.4 C)-98 F (36.7 C)] 98 F (36.7 C) (01/26 1055) Pulse Rate:  [47-70] 60 (01/26 1055) Cardiac Rhythm: Atrial fibrillation (01/26 0724) Resp:  [14-18] 18 (01/26 1055) BP: (123-160)/(52-74) 160/70 (01/26 1055) SpO2:  [98 %-100 %] 100 % (01/26 1055) Weight:  [91.3 kg (201 lb 4.8 oz)] 91.3 kg (201 lb 4.8 oz) (01/26 0434)  Physical Exam: BP Readings from Last 1 Encounters:  06/23/17 (!) 160/70     Wt Readings from Last 1 Encounters:  06/23/17 91.3 kg (201 lb 4.8 oz)    Weight change: -0.227 kg (-8 oz) Body mass index is 31.53 kg/m. HEENT: [place]/AT, Eyes-Brown, PERL, EOMI, Conjunctiva-Pink, Sclera-Non-icteric Neck: No JVD, No bruit, Trachea midline. Lungs:  Clearing, Bilateral. Cardiac:  Regular rhythm, normal S1 and S2, no S3. II/VI systolic murmur. Abdomen:  Soft, non-tender. BS present. Extremities:  No edema present. No cyanosis. No clubbing. CNS: AxOx3, Cranial nerves grossly intact, moves all 4 extremities.  Skin: Warm and dry.   Intake/Output from previous day: 01/25 0701 - 01/26 0700 In: 1264.9 [P.O.:960; I.V.:304.9] Out: 1500 [Urine:1500]    Lab Results: BMET    Component Value Date/Time   NA 139 06/22/2017 0622   NA 137 06/21/2017 0607   NA 138 06/20/2017 0039   NA 138 01/11/2015 1054   NA 138 01/15/2014 1336   NA 142 06/05/2013 1052   K 3.8 06/22/2017 0622   K 3.7 06/21/2017 0607   K 3.4 (L) 06/20/2017 0039   K 3.4 (L) 01/11/2015 1054   K 3.4 (L) 01/15/2014 1336   K 3.1 (L) 06/05/2013 1052   CL 105 06/22/2017 0622   CL 103 06/21/2017 0607   CL 104 06/20/2017 0039   CL 105 11/04/2012 0937   CL 104 10/30/2012 1020   CL 104 09/11/2012 1207   CO2 23 06/22/2017 0622   CO2 22 06/21/2017 0607   CO2 21 (L) 06/20/2017 0039   CO2 23 01/11/2015 1054   CO2 26 01/15/2014 1336   CO2 29 06/05/2013 1052   GLUCOSE 98 06/22/2017 0622   GLUCOSE 98 06/21/2017 0607   GLUCOSE 112 (H) 06/20/2017 0039   GLUCOSE 99 01/11/2015 1054   GLUCOSE 109 01/15/2014 1336   GLUCOSE 95 06/05/2013 1052   GLUCOSE 152 (H) 11/04/2012 0937   GLUCOSE 91 10/30/2012 1020   GLUCOSE 73 09/11/2012 1207   BUN 24 (H) 06/22/2017 0622   BUN 18 06/21/2017 0607   BUN 15 06/20/2017 0039   BUN 18.4 01/11/2015 1054   BUN 21.0 01/15/2014 1336   BUN 22.2 06/05/2013 1052   CREATININE 1.69 (H) 06/22/2017 0622   CREATININE 1.66 (H) 06/21/2017 0607   CREATININE 1.33 (H) 06/20/2017 0039   CREATININE 1.4 (H) 01/11/2015 1054   CREATININE 1.5 (H) 01/15/2014 1336   CREATININE 1.4 (H) 06/05/2013 1052   CALCIUM 8.9 06/22/2017 0622   CALCIUM 8.2 (L) 06/21/2017 0607   CALCIUM 8.4 (L) 06/20/2017 0039   CALCIUM 8.8 01/11/2015 1054   CALCIUM 9.2 01/15/2014 1336   CALCIUM 9.4 06/05/2013 1052   GFRNONAA 29 (L) 06/22/2017 0622   GFRNONAA 29 (L) 06/21/2017 0607   GFRNONAA 38 (L) 06/20/2017 0039   GFRAA 33 (L) 06/22/2017 0622   GFRAA 34 (L) 06/21/2017 0607   GFRAA 44 (L) 06/20/2017 3664  CBC    Component Value Date/Time   WBC 5.8 06/23/2017 0722   RBC 3.95 06/23/2017 0722   HGB 10.4 (L) 06/23/2017 0722   HGB 10.6 (L) 01/11/2015 1054   HCT 31.8 (L) 06/23/2017 0722   HCT 33.1 (L) 01/11/2015 1054   PLT 187 06/23/2017 0722   PLT 207 01/11/2015 1054   MCV 80.5 06/23/2017 0722   MCV 84.1 01/11/2015 1054   MCH 26.3 06/23/2017 0722   MCHC 32.7 06/23/2017 0722   RDW 18.1 (H) 06/23/2017 0722   RDW 16.0 (H) 01/11/2015 1054   LYMPHSABS 1.4 01/11/2015 1054   MONOABS 0.5 01/11/2015 1054   EOSABS 0.1 01/11/2015 1054   BASOSABS 0.1 01/11/2015 1054   HEPATIC Function Panel No results for input(s): PROT in the last 8760 hours.  Invalid input(s):  ALBUMIN,  AST,  ALT,  ALKPHOS,  BILIDIR,  IBILI HEMOGLOBIN A1C No components found for: HGA1C,  MPG CARDIAC ENZYMES Lab Results  Component  Value Date   TROPONINI <0.03 06/20/2017   TROPONINI <0.03 06/20/2017   TROPONINI <0.03 06/20/2017   BNP No results for input(s): PROBNP in the last 8760 hours. TSH Recent Labs    06/20/17 0039  TSH 1.495   CHOLESTEROL Recent Labs    06/20/17 0324  CHOL 133    Scheduled Meds: . aspirin EC  81 mg Oral Daily  . atorvastatin  40 mg Oral q1800  . FLUoxetine  10 mg Oral Daily  . furosemide  40 mg Intravenous BID  . insulin aspart  0-9 Units Subcutaneous TID WC  . losartan  100 mg Oral Daily  . [START ON 06/24/2017] metoprolol tartrate  12.5 mg Oral QHS  . nitroGLYCERIN  0.5 inch Topical Q6H  . potassium chloride SA  20 mEq Oral BID  . regadenoson       Continuous Infusions: . sodium chloride Stopped (06/20/17 0749)  . cefTRIAXone (ROCEPHIN)  IV Stopped (06/22/17 2335)  . heparin 1,300 Units/hr (06/23/17 0527)   PRN Meds:.acetaminophen, levalbuterol, nitroGLYCERIN  Assessment/Plan: CHF with preserved LV systolic function Possible pneumonia Valvular heart disease Multivessel CAD Hypertension DM, II CKD, III Anemia of chronic disease History of breast cancer H/O CVA Moderate MR  Continue antibiotics Increase activity.                         LOS: 4 days    Dixie Dials  MD  06/23/2017, 2:34 PM

## 2017-06-24 LAB — CBC
HCT: 31.5 % — ABNORMAL LOW (ref 36.0–46.0)
Hemoglobin: 10.1 g/dL — ABNORMAL LOW (ref 12.0–15.0)
MCH: 25.8 pg — ABNORMAL LOW (ref 26.0–34.0)
MCHC: 32.1 g/dL (ref 30.0–36.0)
MCV: 80.4 fL (ref 78.0–100.0)
Platelets: 210 10*3/uL (ref 150–400)
RBC: 3.92 MIL/uL (ref 3.87–5.11)
RDW: 17.6 % — ABNORMAL HIGH (ref 11.5–15.5)
WBC: 5.6 10*3/uL (ref 4.0–10.5)

## 2017-06-24 LAB — GLUCOSE, CAPILLARY: Glucose-Capillary: 108 mg/dL — ABNORMAL HIGH (ref 65–99)

## 2017-06-24 LAB — HEPARIN LEVEL (UNFRACTIONATED): Heparin Unfractionated: 0.61 IU/mL (ref 0.30–0.70)

## 2017-06-24 MED ORDER — POTASSIUM CHLORIDE ER 10 MEQ PO TBCR: 20.0000 meq | EXTENDED_RELEASE_TABLET | Freq: Two times a day (BID) | ORAL | 2 refills | Status: DC

## 2017-06-24 MED ORDER — ASPIRIN 81 MG PO TABS: 81.0000 mg | ORAL_TABLET | Freq: Four times a day (QID) | ORAL | Status: DC | PRN

## 2017-06-24 MED ORDER — LEVOFLOXACIN 500 MG PO TABS
500.0000 mg | ORAL_TABLET | Freq: Every day | ORAL | Status: DC
  Administered 2017-06-24: 500 mg via ORAL
  Filled 2017-06-24: qty 1

## 2017-06-24 MED ORDER — LEVOFLOXACIN 500 MG PO TABS: 500.0000 mg | ORAL_TABLET | Freq: Every day | ORAL | 0 refills | Status: DC

## 2017-06-24 MED ORDER — METOPROLOL TARTRATE 25 MG PO TABS: 12.5000 mg | ORAL_TABLET | Freq: Every day | ORAL | 2 refills | Status: DC

## 2017-06-24 NOTE — Discharge Summary (Signed)
Physician Discharge Summary  Patient ID: Diane [name] MRN: [id number] DOB/AGE: 1940/11/26 77 y.o.  Admit date: 06/19/2017 Discharge date: 06/24/2017  Admission Diagnoses: CHF with preserved LV systolic function Possible pneumonia Valvular heart disease Multivessel CAD Hypertension  Anemia of chronic disease History of breast cancer History of stroke Moderate mitral regurgitation  Discharge Diagnoses:  Principal problem: Acute heart failure with resolved LV systolic function Active Problems: Chronic Atrial fibrillation with CHA2DS2VASc score of 7 Possible pneumonia Multivessel coronary artery disease Hypertension Anemia of chronic disease History of breast cancer History of stroke  Moderate Mitral Regurgitation  Discharged Condition: fair  Hospital Course: 21-year-old female with a past medical history of a moderate multivessel coronary artery, hypertension, type 2 diabetes mellitus anemia, history of stroke, chronic atrial fibrillation, moderate mitral regurgitation, history of breast cancer presented with left-sided chest pain radiating to left arm associated with coughing, chills and worsening shortness of breath.  Chest x-ray on admission showed vascular congestion, bilateral patchy opacities and small bilateral pleural effusions.  She was treated with IV Lasix and IV antibiotic. Her condition improved in 48 hours.  She underwent nuclear stress test that failed to show reversible ischemia.  She had atrial fibrillation with a slow ventricular response hence her amiodarone was discontinued and metoprolol was decreased to 12.5 mg p.o. twice daily. She was discharged home in satisfactory condition with a follow-up by primary care physician in 1 week and by Dr. Melinda Crutch in 1 month.  Consults: cardiology  Significant Diagnostic Studies: labs: WBC count and platelet counts were unremarkable hemoglobin was down to 11.5.  Normal electrolytes except for mild hypokalemia  glucose was borderline at 112 mg creatinine was elevated at 1.33 gradually increasing to 1.69.  BNP was 850.7-1-day after admission and was trending downward.  EKG showed atrial fibrillation with a controlled ventricular response and left ventricular hypertrophy.  Chest x-ray showed vascular congestion, bilateral patchy opacities and small bilateral pleural effusions  Nuclear stress test showed no reversible ischemia, hypokinetic inferior wall, left ventricular ejection fraction of 56% and noninvasive risk stratification low.  Treatments: antibiotics: Levaquin and cardiac meds: metoprolol, Pravastatin, triamterene-HCTZ and Dabigatran.  Discharge Exam: Blood pressure (!) 144/82, pulse (!) 55, temperature 98 F (36.7 C), temperature source Oral, resp. rate 18, height 5\' 7"  (1.702 m), weight 90.7 kg (200 lb), SpO2 99 %. General appearance: alert, cooperative and appears stated age. Head: Normocephalic, atraumatic. Eyes: Brown eyes, pink conjunctiva, corneas clear. PERRL, EOM's intact.  Neck: No adenopathy, no carotid bruit, no JVD, supple, symmetrical, trachea midline and thyroid not enlarged. Resp: Clearing to auscultation bilaterally. Cardio: Regular rate and rhythm, S1, S2 normal, II/VI systolic murmur, no click, rub or gallop. GI: Soft, non-tender; bowel sounds normal; no organomegaly. Extremities: No edema, cyanosis or clubbing. Skin: Warm and dry.  Neurologic: Alert and oriented X 3, normal strength and tone. Normal coordination and gait.  Disposition: 01-Home or Self Care   Allergies as of 06/24/2017      Reactions   Shellfish Allergy Anaphylaxis      Medication List    STOP taking these medications   amiodarone 200 MG tablet Commonly known as:  PACERONE   amLODipine 10 MG tablet Commonly known as:  NORVASC     TAKE these medications   albuterol 108 (90 Base) MCG/ACT inhaler Commonly known as:  PROVENTIL HFA;VENTOLIN HFA Inhale 2 puffs into the lungs every 4 (four)  hours as needed for wheezing or shortness of breath.   aspirin 81 MG tablet Take 1  tablet (81 mg total) by mouth every 6 (six) hours as needed for mild pain. What changed:    medication strength  how much to take   dabigatran 75 MG Caps capsule Commonly known as:  PRADAXA Take 75 mg by mouth every 12 (twelve) hours.   FLUoxetine 10 MG capsule Commonly known as:  PROZAC Take 10 mg by mouth daily.   levofloxacin 500 MG tablet Commonly known as:  LEVAQUIN Take 1 tablet (500 mg total) by mouth daily.   losartan 100 MG tablet Commonly known as:  COZAAR Take 1 tablet (100 mg total) by mouth daily.   metFORMIN 500 MG 24 hr tablet Commonly known as:  GLUCOPHAGE-XR Take 1 tablet (500 mg total) by mouth daily with breakfast. Total dose of 1000mg  daily What changed:    when to take this  additional instructions   metoprolol tartrate 25 MG tablet Commonly known as:  LOPRESSOR Take 0.5 tablets (12.5 mg total) by mouth at bedtime.   NITROSTAT 0.4 MG SL tablet Generic drug:  nitroGLYCERIN Place 0.4 mg under the tongue every 5 (five) minutes as needed for chest pain.   potassium chloride 10 MEQ tablet Commonly known as:  K-DUR Take 2 tablets (20 mEq total) by mouth 2 (two) times daily. What changed:    how much to take  when to take this   pravastatin 40 MG tablet Commonly known as:  PRAVACHOL Take 40 mg by mouth every evening.   traZODone 50 MG tablet Commonly known as:  DESYREL Take 50 mg by mouth at bedtime.   triamterene-hydrochlorothiazide 37.5-25 MG capsule Commonly known as:  DYAZIDE Take 1 capsule by mouth daily.      Follow-up Information    Stoneking, Hal, MD. Schedule an appointment as soon as possible for a visit in 1 week(s).   Specialty:  Internal Medicine Contact information: [address] [place] [address] [phone]        [name], MD. Schedule an appointment as soon as possible for a visit in 1 month(s).    Specialty:  Cardiology Contact information: [place] [address] [place] [address] [phone]           Signed: Birdie Riddle 06/24/2017, 11:00 AM

## 2017-06-24 NOTE — Progress Notes (Signed)
Call placed to [facility] to notify of telemetry monitoring d/c.   

## 2017-06-24 NOTE — Progress Notes (Signed)
ANTICOAGULATION CONSULT NOTE- Follow up  Pharmacy Consult for Heparin Indication: atrial fibrillation and chest pain/ACS  Allergies  Allergen Reactions  . Shellfish Allergy Anaphylaxis   Labs: Recent Labs    06/22/17 0622 06/23/17 0722 06/24/17 0550  HGB 10.6* 10.4* 10.1*  HCT 32.5* 31.8* 31.5*  PLT 173 187 210  HEPARINUNFRC 0.57 0.39 0.61  CREATININE 1.69*  --   --     Estimated Creatinine Clearance: 33.7 mL/min (A) (by C-G formula based on SCr of 1.69 mg/dL (H)).  Medications:  Pradaxa PTA dose 75mg  BID (last dose 06/18/17 at 2300) for afib  Assessment: 76yof presented to ED on 06/19/17 for SOB and chest pain. Pradaxa held and pharmacy consulted to dose IV heparin. S/p lexiscan myoview 1/26 -heparin level is at goal   Goal of Therapy:  Heparin level 0.3-0.7 units/ml Monitor platelets by anticoagulation protocol: Yes   Plan:  Continue IV  heparin 1300 units/hr Daily heparin level, CBC Will follow plans for resuming oral AC  Hildred Laser, Pharm D 06/24/2017 11:00 AM

## 2017-06-28 ENCOUNTER — Ambulatory Visit
Admission: RE | Admit: 2017-06-28 | Discharge: 2017-06-28 | Disposition: A | Discharge status: Home / Self Care | Payer: Medicare Other | Source: Ambulatory Visit | Attending: Geriatric Medicine | Admitting: Geriatric Medicine

## 2017-06-28 ENCOUNTER — Other Ambulatory Visit: Payer: Self-pay | Admitting: Geriatric Medicine

## 2017-06-28 DIAGNOSIS — J189 Pneumonia, unspecified organism: Secondary | ICD-10-CM

## 2017-06-28 DIAGNOSIS — J181 Lobar pneumonia, unspecified organism: Principal | ICD-10-CM

## 2017-07-02 ENCOUNTER — Inpatient Hospital Stay (HOSPITAL_COMMUNITY)
Admission: EM | Admit: 2017-07-02 | Discharge: 2017-07-08 | DRG: 871 | Disposition: A | Discharge status: Home / Self Care | Payer: Medicare Other | Attending: Internal Medicine | Admitting: Internal Medicine

## 2017-07-02 ENCOUNTER — Emergency Department (HOSPITAL_COMMUNITY): Payer: Medicare Other

## 2017-07-02 DIAGNOSIS — Z8544 Personal history of malignant neoplasm of other female genital organs: Secondary | ICD-10-CM

## 2017-07-02 DIAGNOSIS — C50111 Malignant neoplasm of central portion of right female breast: Secondary | ICD-10-CM | POA: Diagnosis present

## 2017-07-02 DIAGNOSIS — Z923 Personal history of irradiation: Secondary | ICD-10-CM

## 2017-07-02 DIAGNOSIS — A0472 Enterocolitis due to Clostridium difficile, not specified as recurrent: Secondary | ICD-10-CM | POA: Diagnosis not present

## 2017-07-02 DIAGNOSIS — I2510 Atherosclerotic heart disease of native coronary artery without angina pectoris: Secondary | ICD-10-CM | POA: Diagnosis present

## 2017-07-02 DIAGNOSIS — A414 Sepsis due to anaerobes: Principal | ICD-10-CM | POA: Diagnosis present

## 2017-07-02 DIAGNOSIS — E1169 Type 2 diabetes mellitus with other specified complication: Secondary | ICD-10-CM | POA: Diagnosis present

## 2017-07-02 DIAGNOSIS — E669 Obesity, unspecified: Secondary | ICD-10-CM | POA: Diagnosis present

## 2017-07-02 DIAGNOSIS — Z9221 Personal history of antineoplastic chemotherapy: Secondary | ICD-10-CM

## 2017-07-02 DIAGNOSIS — Z853 Personal history of malignant neoplasm of breast: Secondary | ICD-10-CM

## 2017-07-02 DIAGNOSIS — I130 Hypertensive heart and chronic kidney disease with heart failure and stage 1 through stage 4 chronic kidney disease, or unspecified chronic kidney disease: Secondary | ICD-10-CM | POA: Diagnosis present

## 2017-07-02 DIAGNOSIS — I480 Paroxysmal atrial fibrillation: Secondary | ICD-10-CM | POA: Diagnosis present

## 2017-07-02 DIAGNOSIS — D72829 Elevated white blood cell count, unspecified: Secondary | ICD-10-CM

## 2017-07-02 DIAGNOSIS — R0689 Other abnormalities of breathing: Secondary | ICD-10-CM

## 2017-07-02 DIAGNOSIS — Z79899 Other long term (current) drug therapy: Secondary | ICD-10-CM

## 2017-07-02 DIAGNOSIS — I10 Essential (primary) hypertension: Secondary | ICD-10-CM | POA: Diagnosis present

## 2017-07-02 DIAGNOSIS — N183 Chronic kidney disease, stage 3 unspecified: Secondary | ICD-10-CM | POA: Diagnosis present

## 2017-07-02 DIAGNOSIS — Z7902 Long term (current) use of antithrombotics/antiplatelets: Secondary | ICD-10-CM

## 2017-07-02 DIAGNOSIS — F329 Major depressive disorder, single episode, unspecified: Secondary | ICD-10-CM | POA: Diagnosis present

## 2017-07-02 DIAGNOSIS — Z8701 Personal history of pneumonia (recurrent): Secondary | ICD-10-CM

## 2017-07-02 DIAGNOSIS — R509 Fever, unspecified: Secondary | ICD-10-CM

## 2017-07-02 DIAGNOSIS — T148XXA Other injury of unspecified body region, initial encounter: Secondary | ICD-10-CM | POA: Diagnosis not present

## 2017-07-02 DIAGNOSIS — D509 Iron deficiency anemia, unspecified: Secondary | ICD-10-CM | POA: Diagnosis present

## 2017-07-02 DIAGNOSIS — M25552 Pain in left hip: Secondary | ICD-10-CM | POA: Diagnosis not present

## 2017-07-02 DIAGNOSIS — E876 Hypokalemia: Secondary | ICD-10-CM | POA: Diagnosis present

## 2017-07-02 DIAGNOSIS — F419 Anxiety disorder, unspecified: Secondary | ICD-10-CM | POA: Diagnosis present

## 2017-07-02 DIAGNOSIS — Z91013 Allergy to seafood: Secondary | ICD-10-CM

## 2017-07-02 DIAGNOSIS — Z7984 Long term (current) use of oral hypoglycemic drugs: Secondary | ICD-10-CM

## 2017-07-02 DIAGNOSIS — M797 Fibromyalgia: Secondary | ICD-10-CM | POA: Diagnosis present

## 2017-07-02 DIAGNOSIS — Z7982 Long term (current) use of aspirin: Secondary | ICD-10-CM

## 2017-07-02 DIAGNOSIS — Z87891 Personal history of nicotine dependence: Secondary | ICD-10-CM

## 2017-07-02 DIAGNOSIS — E785 Hyperlipidemia, unspecified: Secondary | ICD-10-CM | POA: Diagnosis present

## 2017-07-02 DIAGNOSIS — I5033 Acute on chronic diastolic (congestive) heart failure: Secondary | ICD-10-CM | POA: Diagnosis present

## 2017-07-02 DIAGNOSIS — Z6831 Body mass index (BMI) 31.0-31.9, adult: Secondary | ICD-10-CM

## 2017-07-02 DIAGNOSIS — E1122 Type 2 diabetes mellitus with diabetic chronic kidney disease: Secondary | ICD-10-CM | POA: Diagnosis present

## 2017-07-02 DIAGNOSIS — A498 Other bacterial infections of unspecified site: Secondary | ICD-10-CM

## 2017-07-02 DIAGNOSIS — I4811 Longstanding persistent atrial fibrillation: Secondary | ICD-10-CM | POA: Diagnosis present

## 2017-07-02 LAB — CBC WITH DIFFERENTIAL/PLATELET
Basophils Absolute: 0 10*3/uL (ref 0.0–0.1)
Basophils Relative: 0 %
Eosinophils Absolute: 0 10*3/uL (ref 0.0–0.7)
Eosinophils Relative: 0 %
HCT: 34.3 % — ABNORMAL LOW (ref 36.0–46.0)
Hemoglobin: 10.9 g/dL — ABNORMAL LOW (ref 12.0–15.0)
Lymphocytes Relative: 8 %
Lymphs Abs: 1.4 10*3/uL (ref 0.7–4.0)
MCH: 26 pg (ref 26.0–34.0)
MCHC: 31.8 g/dL (ref 30.0–36.0)
MCV: 81.9 fL (ref 78.0–100.0)
Monocytes Absolute: 0.9 10*3/uL (ref 0.1–1.0)
Monocytes Relative: 5 %
Neutro Abs: 14.8 10*3/uL — ABNORMAL HIGH (ref 1.7–7.7)
Neutrophils Relative %: 87 %
Platelets: 193 10*3/uL (ref 150–400)
RBC: 4.19 MIL/uL (ref 3.87–5.11)
RDW: 17.8 % — ABNORMAL HIGH (ref 11.5–15.5)
WBC: 17.1 10*3/uL — ABNORMAL HIGH (ref 4.0–10.5)

## 2017-07-02 LAB — URINALYSIS, ROUTINE W REFLEX MICROSCOPIC
Bilirubin Urine: NEGATIVE
Glucose, UA: NEGATIVE mg/dL
Hgb urine dipstick: NEGATIVE
Ketones, ur: NEGATIVE mg/dL
Leukocytes, UA: NEGATIVE
Nitrite: NEGATIVE
Protein, ur: NEGATIVE mg/dL
Specific Gravity, Urine: 1.016 (ref 1.005–1.030)
pH: 5 (ref 5.0–8.0)

## 2017-07-02 LAB — COMPREHENSIVE METABOLIC PANEL
ALT: 14 U/L (ref 14–54)
AST: 16 U/L (ref 15–41)
Albumin: 3.3 g/dL — ABNORMAL LOW (ref 3.5–5.0)
Alkaline Phosphatase: 70 U/L (ref 38–126)
Anion gap: 11 (ref 5–15)
BUN: 17 mg/dL (ref 6–20)
CO2: 19 mmol/L — ABNORMAL LOW (ref 22–32)
Calcium: 8.5 mg/dL — ABNORMAL LOW (ref 8.9–10.3)
Chloride: 107 mmol/L (ref 101–111)
Creatinine, Ser: 1.51 mg/dL — ABNORMAL HIGH (ref 0.44–1.00)
GFR calc Af Amer: 38 mL/min — ABNORMAL LOW (ref >60)
GFR calc non Af Amer: 33 mL/min — ABNORMAL LOW (ref >60)
Glucose, Bld: 108 mg/dL — ABNORMAL HIGH (ref 65–99)
Potassium: 3.4 mmol/L — ABNORMAL LOW (ref 3.5–5.1)
Sodium: 137 mmol/L (ref 135–145)
Total Bilirubin: 0.7 mg/dL (ref 0.3–1.2)
Total Protein: 6.7 g/dL (ref 6.5–8.1)

## 2017-07-02 LAB — I-STAT TROPONIN, ED: Troponin i, poc: 0 ng/mL (ref 0.00–0.08)

## 2017-07-02 LAB — C DIFFICILE QUICK SCREEN W PCR REFLEX
C Diff antigen: POSITIVE — AB
C Diff interpretation: DETECTED
C Diff toxin: POSITIVE — AB

## 2017-07-02 LAB — I-STAT CG4 LACTIC ACID, ED: Lactic Acid, Venous: 0.93 mmol/L (ref 0.5–1.9)

## 2017-07-02 MED ORDER — SODIUM CHLORIDE 0.9 % IV BOLUS (SEPSIS)
500.0000 mL | Freq: Once | INTRAVENOUS | Status: AC
  Administered 2017-07-02: 500 mL via INTRAVENOUS

## 2017-07-02 MED ORDER — VANCOMYCIN 50 MG/ML ORAL SOLUTION
125.0000 mg | Freq: Four times a day (QID) | ORAL | Status: DC
  Administered 2017-07-02 – 2017-07-08 (×23): 125 mg via ORAL
  Filled 2017-07-02 (×27): qty 2.5

## 2017-07-02 MED ORDER — ACETAMINOPHEN 500 MG PO TABS
1000.0000 mg | ORAL_TABLET | Freq: Once | ORAL | Status: AC
  Administered 2017-07-02: 1000 mg via ORAL
  Filled 2017-07-02: qty 2

## 2017-07-02 NOTE — ED Provider Notes (Addendum)
[facility] EMERGENCY DEPARTMENT Provider Note   CSN: [id number] Arrival date & time: 07/02/17  1635     History   Chief Complaint Chief Complaint  Patient presents with  . Fall  . Weakness    HPI Diane [name] is a 77 y.o. female.  HPI  Patient with history of atrial fibrillation, recent admission and treatment for pneumonia, hypertension presents with complaint of generalized weakness and diarrhea.  She states that she has been having diarrhea every 30 minutes to 1 hour for the past week since she was discharged from the hospital.  Today she had diarrhea and then lost her balance getting up from the toilet.  This caused her to fall on the floor.  She denies syncope or lightheadedness.  She denies any pain from the fall but she was unable to get up from the floor.  There is a note that EMS stated she had right arm weakness but she states that the inability to move her arms is due to neck pain that has been ongoing for several weeks.  She denies any focal weakness, she denies any chest pain or difficulty breathing.  She has not had fever.  She denies blood or mucus in her stool.  She does state that after every p.o. intake she has cramping of her abdomen and diarrhea.  She denies any specific area of abdominal pain.  There are no other associated systemic symptoms, there are no other alleviating or modifying factors.   Past Medical History:  Diagnosis Date  . Anxiety   . Arthritis    back  . Atrial fibrillation ([place])   . Breast cancer ([place])    right breast cancer - 3 years ago  . Coronary artery disease   . Depression   . Diabetes mellitus without complication (HCC)    diet  . Dysrhythmia    afib  . Fibromyalgia   . Herniated disc   . Hyperlipemia   . Hypertension   . Iron deficiency anemia   . Lower back pain   . Lumbar stenosis    L4-5  . S/P radiation therapy 01/23/2013-03/06/2013   Right breast / 46 Gy in 23 fractions / Right breast boost / 14 Gy  in 7 fractions  . Spondylolysis   . 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 Status post chemotherapy 11/04/2012 - 01/06/2013.   Docetaxel/Cytoxan Q 3 Weeks x 4 cycles  . Torn rotator cuff     Patient Active Problem List   Diagnosis Date Noted  . Acute congestive heart failure ([place]) 06/19/2017  . Chest pain 09/11/2014  . Hypokalemia 01/15/2014  . 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 Swelling of limb 01/23/2013  . Oropharyngeal candidiasis 01/13/2013  . Chemotherapy induced neutropenia ([place]) 01/13/2013  . UTI (urinary tract infection) 12/25/2012  . Cancer of central portion of right female breast ([place]) 09/06/2012  . Vulva cancer ([place]) 08/16/2011  . INSOMNIA 08/26/2010  . HEADACHE 08/26/2010  . TOBACCO USER 11/04/2009  . POSTMENOPAUSAL STATUS 11/04/2009  . DYSLIPIDEMIA 07/30/2009  . HAIR LOSS 07/30/2009  . ANEMIA 07/26/2009  . SINUSITIS, ACUTE 04/02/2009  . DIABETES MELLITUS, TYPE II 01/01/2009  . URINARY TRACT INFECTION SITE NOT SPECIFIED 11/26/2008  . BREAST PAIN, RIGHT 11/12/2008  . ANGIOEDEMA 10/05/2008  . CHEST PAIN, ATYPICAL 09/01/2008  . ALLERGIC RHINITIS 05/05/2008  . VAGINITIS 05/05/2008  . CAROTID BRUIT, RIGHT 11/12/2007  . SHOULDER PAIN, LEFT,  CHRONIC 08/09/2007  . 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 DEGENERATIVE DISC DISEASE, LUMBOSACRAL SPINE 08/09/2007  . HYPERTENSION, BENIGN ESSENTIAL 03/20/2007  . IRREGULAR HEART RATE 03/20/2007  . BRONCHITIS, ACUTE 03/20/2007  . DEPRESSION 02/27/2007  . MURMUR 02/27/2007    Past Surgical History:  Procedure Laterality Date  . ABDOMINAL HYSTERECTOMY    . BREAST BIOPSY Right 09/04/2012  . BREAST LUMPECTOMY Right 10/08/2012  . COLONOSCOPY    . DILATION AND CURETTAGE OF UTERUS    . LEFT HEART CATHETERIZATION WITH CORONARY ANGIOGRAM N/A 09/14/2014   Procedure: LEFT HEART CATHETERIZATION WITH CORONARY ANGIOGRAM;  Surgeon: [name], MD;  Location:  [facility] CATH LAB;  Service: Cardiovascular;  Laterality: N/A;  . OPEN REDUCTION INTERNAL FIXATION (ORIF) PROXIMAL PHALANX Right 01/04/2016   Procedure: OPEN TREATMENT OF RIGHT THUMB PROXIMAL PHALANX FRACTURE;  Surgeon: Milly Jakob, MD;  Location: [facility];  Service: Orthopedics;  Laterality: Right;  . PARTIAL MASTECTOMY WITH NEEDLE LOCALIZATION AND AXILLARY SENTINEL LYMPH NODE BX Right 10/08/2012   Procedure: RIGHT PARTIAL MASTECTOMY WITH NEEDLE LOCALIZATION AND RIGHT AXILLARY SENTINEL LYMPH NODE BX;  Surgeon: Adin Hector, MD;  Location: [facility];  Service: General;  Laterality: Right;  Injection of 1% Methylene Blue dye into right breast  . PORTACATH PLACEMENT Left 10/08/2012   Procedure: INSERTION PORT-A-CATH WITH ULTRASOUND GUIDANCE;  Surgeon: Adin Hector, MD;  Location: [facility];  Service: General;  Laterality: Left;  Using 35F [place]  . Posterior Lumbar Arthrodesis, Pedicle screw fixation, Posterolateral Arthodesis  03/17/11  . SHOULDER ARTHROSCOPY WITH ROTATOR CUFF REPAIR AND SUBACROMIAL DECOMPRESSION  06/04/2012   Procedure: SHOULDER ARTHROSCOPY WITH ROTATOR CUFF REPAIR AND SUBACROMIAL DECOMPRESSION;  Surgeon: Nita Sells, MD;  Location: [facility];  Service: Orthopedics;  Laterality: Left;  LEFT SHOULDER ARTHROSCOPY WITH ROTATOR CUFF REPAIR AND SUBACROMIAL DECOMPRESSION AND DISTAL CLAVICLE RESECTION  . TONSILLECTOMY    . TUBAL LIGATION    . VULVECTOMY PARTIAL    . Wide Local Excision  01/2010   Bilateral groin excisions, re-excision of vulva    OB History    No data available       Home Medications    Prior to Admission medications   Medication Sig Start Date End Date Taking? Authorizing Provider  acetaminophen (TYLENOL) 500 MG tablet Take 1,000 mg by mouth every 6 (six) hours as needed for headache (pain).   Yes [provider]  albuterol (PROVENTIL HFA;VENTOLIN HFA) 108 (90 BASE) MCG/ACT inhaler Inhale 2 puffs into the  lungs every 4 (four) hours as needed for wheezing or shortness of breath. 05/12/15  Yes Melony Overly, MD  aspirin 81 MG tablet Take 1 tablet (81 mg total) by mouth every 6 (six) hours as needed for mild pain. Patient taking differently: Take 81 mg by mouth every 6 (six) hours as needed for pain.  06/24/17  Yes Dixie Dials, MD  dabigatran (PRADAXA) 75 MG CAPS Take 75 mg by mouth every 12 (twelve) hours.   Yes [provider]  FLUoxetine (PROZAC) 10 MG capsule Take 10 mg by mouth daily.   Yes [provider]  losartan (COZAAR) 100 MG tablet Take 1 tablet (100 mg total) by mouth daily. 09/15/14  Yes [name], MD  metFORMIN (GLUCOPHAGE-XR) 500 MG 24 hr tablet Take 1 tablet (500 mg total) by mouth daily with breakfast. Total dose of 1000mg  daily Patient taking differently: Take 500 mg by mouth daily.  09/16/14  Yes [name], MD  metoprolol  tartrate (LOPRESSOR) 25 MG tablet Take 0.5 tablets (12.5 mg total) by mouth at bedtime. 06/24/17  Yes Dixie Dials, MD  NITROSTAT 0.4 MG SL tablet Place 0.4 mg under the tongue every 5 (five) minutes as needed for chest pain.  10/14/12  Yes [provider]  potassium chloride (K-DUR) 10 MEQ tablet Take 2 tablets (20 mEq total) by mouth 2 (two) times daily. 06/24/17  Yes Dixie Dials, MD  pravastatin (PRAVACHOL) 40 MG tablet Take 40 mg by mouth at bedtime.    Yes [provider]  traZODone (DESYREL) 50 MG tablet Take 50 mg by mouth at bedtime.   Yes [provider]  triamterene-hydrochlorothiazide (DYAZIDE) 37.5-25 MG per capsule Take 1 capsule by mouth daily.  08/10/14  Yes [provider]  levofloxacin (LEVAQUIN) 500 MG tablet Take 1 tablet (500 mg total) by mouth daily. Patient not taking: Reported on 07/02/2017 06/24/17   Dixie Dials, MD    Family History Family History  Problem Relation Age of Onset  . Stroke Mother   . Alzheimer's disease Mother   . Heart attack Father   . Heart attack Brother       Social History Social History   Tobacco Use  . Smoking status: Former Smoker    Years: 50.00    Types: Cigarettes    Last attempt to quit: 05/29/2009    Years since quitting: 8.0  . Smokeless tobacco: Never Used  Substance Use Topics  . Alcohol use: Yes    Comment: rare  . Drug use: No     Allergies   Shellfish allergy   Review of Systems Review of Systems  ROS reviewed and all otherwise negative except for mentioned in HPI   Physical Exam Updated Vital Signs BP (!) 142/55   Pulse 62   Temp (!) 101.2 F (38.4 C) (Oral)   Resp (!) 23   Ht 5\' 7"  (1.702 m)   Wt 90.7 kg (200 lb)   SpO2 94%   BMI 31.32 kg/m  Vitals reviewed Physical Exam  Physical Examination: General appearance - alert, well appearing, and in no distress Mental status - alert, oriented to person, place, and time Eyes - pupils equal and reactive, extraocular eye movements intact Mouth - mucous membranes moist, pharynx normal without lesions Neck - supple, no significant adenopathy Chest - clear to auscultation, no wheezes, rales or rhonchi, symmetric air entry Heart - normal rate, regular rhythm, normal S1, S2, no murmurs, rubs, clicks or gallops Abdomen - soft, nontender, nondistended, no masses or organomegaly, nabs Neurological - alert, oriented, normal speech, no focal findings or movement disorder noted Extremities - peripheral pulses normal, no pedal edema, no clubbing or cyanosis Skin - normal coloration and turgor, no rashes,    ED Treatments / Results  Labs (all labs ordered are listed, but only abnormal results are displayed) Labs Reviewed  C DIFFICILE QUICK SCREEN W PCR REFLEX - Abnormal; Notable for the following components:      Result Value   C Diff antigen POSITIVE (*)    C Diff toxin POSITIVE (*)    All other components within normal limits  CBC WITH DIFFERENTIAL/PLATELET - Abnormal; Notable for the following components:   WBC 17.1 (*)    Hemoglobin 10.9 (*)    HCT  34.3 (*)    RDW 17.8 (*)    Neutro Abs 14.8 (*)    All other components within normal limits  COMPREHENSIVE METABOLIC PANEL - Abnormal; Notable for the following components:  Potassium 3.4 (*)    CO2 19 (*)    Glucose, Bld 108 (*)    Creatinine, Ser 1.51 (*)    Calcium 8.5 (*)    Albumin 3.3 (*)    GFR calc non Af Amer 33 (*)    GFR calc Af Amer 38 (*)    All other components within normal limits  GASTROINTESTINAL PANEL BY PCR, STOOL (REPLACES STOOL CULTURE)  CULTURE, BLOOD (ROUTINE X 2)  CULTURE, BLOOD (ROUTINE X 2)  URINE CULTURE  URINALYSIS, ROUTINE W REFLEX MICROSCOPIC  I-STAT TROPONIN, ED  I-STAT CG4 LACTIC ACID, ED    EKG  EKG Interpretation  Date/Time:  Monday July 02 2017 18:36:20 EST Ventricular Rate:  67 PR Interval:    QRS Duration: 104 QT Interval:  469 QTC Calculation: 496 R Axis:   -28 Text Interpretation:  Atrial fibrillation Abnormal R-wave progression, late transition Probable left ventricular hypertrophy Borderline T abnormalities, anterior leads Borderline prolonged QT interval No significant change since last tracing Confirmed by Alfonzo Beers [phone]) on 07/02/2017 7:42:57 PM       Radiology Dg Chest 2 View  Result Date: 07/02/2017 CLINICAL DATA:  Weakness with fall EXAM: CHEST  2 VIEW COMPARISON:  June 28, 2017 FINDINGS: There is no evident edema or consolidation. There is persistent cardiomegaly with mild pulmonary venous hypertension. No appreciable pleural effusion. There is aortic atherosclerosis. No adenopathy. No pneumothorax. No appreciable bone lesions. IMPRESSION: Pulmonary vascular congestion without edema or consolidation. No pneumothorax. There is aortic atherosclerosis. Aortic Atherosclerosis (ICD10-I70.0). Electronically Signed   By: Lowella Grip III M.D.   On: 07/02/2017 18:09    Procedures Procedures (including critical care time)  Medications Ordered in ED Medications  sodium chloride 0.9 % bolus 500 mL (not  administered)  acetaminophen (TYLENOL) tablet 1,000 mg (not administered)  vancomycin (VANCOCIN) 50 mg/mL oral solution 125 mg (not administered)  sodium chloride 0.9 % bolus 500 mL (0 mLs Intravenous Stopped 07/02/17 2041)     Initial Impression / Assessment and Plan / ED Course  I have reviewed the triage vital signs and the nursing notes.  Pertinent labs & imaging results that were available during my care of the patient were reviewed by me and considered in my medical decision making (see chart for details).     Patient presenting with diarrhea which is been worsening since treatment with antibiotics for pneumonia.  She complains of generalized weakness and had a fall today after having a diarrhea stool.  She is continuing to have multiple episodes of diarrhea while in the ED.  She has developed a fever while in the ED.  Her C. difficile is positive.  Patient started on vancomycin and due to ongoing diarrhea and generalized weakness causing falls will admit to medicine for further management.  10:45 PM  D/w Dr. Robby Sermon- he will see patient in the ED for admisison.    Final Clinical Impressions(s) / ED Diagnoses   Final diagnoses:  Clostridium difficile infection  Febrile illness  Leukocytosis, unspecified type    ED Discharge Orders    None       Mabe, Forbes Cellar, MD 07/02/17 2223    Pixie Casino, MD 07/02/17 2246

## 2017-07-02 NOTE — ED Notes (Signed)
Received call from lab, stool positive for c-diff

## 2017-07-02 NOTE — ED Notes (Signed)
Pt states she would be willing to receive physical therapy outpatient to regain lost strength r/t recent hospitalization. Pt lives at home alone and is concerned with lost independence

## 2017-07-02 NOTE — ED Notes (Signed)
Patient transported to X-ray 

## 2017-07-02 NOTE — ED Notes (Signed)
Pt had episode of light brown, watery stool incontinence. Pt placed on bedside commode to finish BM, sheets cleaned, and pt placed back in bed, call bell in reach

## 2017-07-02 NOTE — Progress Notes (Signed)
Pharmacy Antibiotic Note  Diane [name] is a 77 y.o. female admitted on 07/02/2017 with C. Diff.  Pharmacy has been consulted for PO vancomycin dosing.  C. Diff positive. Recently had Levaquin. No known history of C. Diff. Tmax od 101.2, WBC 17.1.  Plan: Start vancomycin 125mg  PO QID x 10 days Monitor clinical picture   Height: 5\' 7"  (170.2 cm) Weight: 200 lb (90.7 kg) IBW/kg (Calculated) : 61.6  Temp (24hrs), Avg:101.2 F (38.4 C), Min:101.2 F (38.4 C), Max:101.2 F (38.4 C)  Recent Labs  Lab 07/02/17 1729  WBC 17.1*  CREATININE 1.51*    Estimated Creatinine Clearance: 37.8 mL/min (A) (by C-G formula based on SCr of 1.51 mg/dL (H)).    Allergies  Allergen Reactions  . Shellfish Allergy Anaphylaxis    Thank you for allowing pharmacy to be a part of this patient's care.  Reginia Naas 07/02/2017 10:22 PM

## 2017-07-02 NOTE — ED Triage Notes (Signed)
Pt arrived EMS s/p fall at 1400 and weakness x at least 3 days. Pt has been experiencing weakness for months but has increased falls and weakness. Denies hitting her head today. A&Ox4 EMS reports Right sided extremity weakness with drift.

## 2017-07-02 NOTE — ED Notes (Signed)
Lab called regarding stool specimen, states they do have it but have not gotten to it yet.

## 2017-07-02 NOTE — ED Triage Notes (Signed)
Ems reports pt recently d/c last week for pneumonia as well. EMS vital PTA BP 138/92 P62 afib RR16 97%RA [id number]

## 2017-07-03 ENCOUNTER — Encounter (HOSPITAL_COMMUNITY): Payer: Self-pay | Admitting: Internal Medicine

## 2017-07-03 DIAGNOSIS — F419 Anxiety disorder, unspecified: Secondary | ICD-10-CM | POA: Diagnosis present

## 2017-07-03 DIAGNOSIS — D72829 Elevated white blood cell count, unspecified: Secondary | ICD-10-CM | POA: Diagnosis not present

## 2017-07-03 DIAGNOSIS — I5033 Acute on chronic diastolic (congestive) heart failure: Secondary | ICD-10-CM | POA: Diagnosis present

## 2017-07-03 DIAGNOSIS — N183 Chronic kidney disease, stage 3 unspecified: Secondary | ICD-10-CM | POA: Diagnosis present

## 2017-07-03 DIAGNOSIS — E669 Obesity, unspecified: Secondary | ICD-10-CM | POA: Diagnosis present

## 2017-07-03 DIAGNOSIS — Z9221 Personal history of antineoplastic chemotherapy: Secondary | ICD-10-CM | POA: Diagnosis not present

## 2017-07-03 DIAGNOSIS — Z8544 Personal history of malignant neoplasm of other female genital organs: Secondary | ICD-10-CM | POA: Diagnosis not present

## 2017-07-03 DIAGNOSIS — A0472 Enterocolitis due to Clostridium difficile, not specified as recurrent: Secondary | ICD-10-CM | POA: Diagnosis present

## 2017-07-03 DIAGNOSIS — I10 Essential (primary) hypertension: Secondary | ICD-10-CM | POA: Diagnosis not present

## 2017-07-03 DIAGNOSIS — R0689 Other abnormalities of breathing: Secondary | ICD-10-CM | POA: Diagnosis not present

## 2017-07-03 DIAGNOSIS — I130 Hypertensive heart and chronic kidney disease with heart failure and stage 1 through stage 4 chronic kidney disease, or unspecified chronic kidney disease: Secondary | ICD-10-CM | POA: Diagnosis present

## 2017-07-03 DIAGNOSIS — E1122 Type 2 diabetes mellitus with diabetic chronic kidney disease: Secondary | ICD-10-CM | POA: Diagnosis present

## 2017-07-03 DIAGNOSIS — B9689 Other specified bacterial agents as the cause of diseases classified elsewhere: Secondary | ICD-10-CM | POA: Diagnosis not present

## 2017-07-03 DIAGNOSIS — Z7984 Long term (current) use of oral hypoglycemic drugs: Secondary | ICD-10-CM | POA: Diagnosis not present

## 2017-07-03 DIAGNOSIS — E1169 Type 2 diabetes mellitus with other specified complication: Secondary | ICD-10-CM | POA: Diagnosis not present

## 2017-07-03 DIAGNOSIS — R0600 Dyspnea, unspecified: Secondary | ICD-10-CM | POA: Diagnosis not present

## 2017-07-03 DIAGNOSIS — I2510 Atherosclerotic heart disease of native coronary artery without angina pectoris: Secondary | ICD-10-CM | POA: Diagnosis present

## 2017-07-03 DIAGNOSIS — Z853 Personal history of malignant neoplasm of breast: Secondary | ICD-10-CM | POA: Diagnosis not present

## 2017-07-03 DIAGNOSIS — Z923 Personal history of irradiation: Secondary | ICD-10-CM | POA: Diagnosis not present

## 2017-07-03 DIAGNOSIS — M797 Fibromyalgia: Secondary | ICD-10-CM | POA: Diagnosis present

## 2017-07-03 DIAGNOSIS — Z87891 Personal history of nicotine dependence: Secondary | ICD-10-CM | POA: Diagnosis not present

## 2017-07-03 DIAGNOSIS — C50111 Malignant neoplasm of central portion of right female breast: Secondary | ICD-10-CM | POA: Diagnosis not present

## 2017-07-03 DIAGNOSIS — R509 Fever, unspecified: Secondary | ICD-10-CM | POA: Diagnosis not present

## 2017-07-03 DIAGNOSIS — Z91013 Allergy to seafood: Secondary | ICD-10-CM | POA: Diagnosis not present

## 2017-07-03 DIAGNOSIS — I480 Paroxysmal atrial fibrillation: Secondary | ICD-10-CM | POA: Diagnosis present

## 2017-07-03 DIAGNOSIS — E876 Hypokalemia: Secondary | ICD-10-CM | POA: Diagnosis present

## 2017-07-03 DIAGNOSIS — F329 Major depressive disorder, single episode, unspecified: Secondary | ICD-10-CM | POA: Diagnosis present

## 2017-07-03 DIAGNOSIS — A414 Sepsis due to anaerobes: Secondary | ICD-10-CM | POA: Diagnosis present

## 2017-07-03 DIAGNOSIS — Z7982 Long term (current) use of aspirin: Secondary | ICD-10-CM | POA: Diagnosis not present

## 2017-07-03 DIAGNOSIS — D509 Iron deficiency anemia, unspecified: Secondary | ICD-10-CM | POA: Diagnosis present

## 2017-07-03 DIAGNOSIS — E785 Hyperlipidemia, unspecified: Secondary | ICD-10-CM | POA: Diagnosis present

## 2017-07-03 DIAGNOSIS — I4811 Longstanding persistent atrial fibrillation: Secondary | ICD-10-CM | POA: Diagnosis present

## 2017-07-03 LAB — CBG MONITORING, ED
Glucose-Capillary: 89 mg/dL (ref 65–99)
Glucose-Capillary: 113 mg/dL — ABNORMAL HIGH (ref 65–99)

## 2017-07-03 LAB — GASTROINTESTINAL PANEL BY PCR, STOOL (REPLACES STOOL CULTURE)

## 2017-07-03 LAB — CBC
HCT: 32.1 % — ABNORMAL LOW (ref 36.0–46.0)
Hemoglobin: 10.2 g/dL — ABNORMAL LOW (ref 12.0–15.0)
MCH: 26.2 pg (ref 26.0–34.0)
MCHC: 31.8 g/dL (ref 30.0–36.0)
MCV: 82.5 fL (ref 78.0–100.0)
Platelets: 198 10*3/uL (ref 150–400)
RBC: 3.89 MIL/uL (ref 3.87–5.11)
RDW: 17.8 % — ABNORMAL HIGH (ref 11.5–15.5)
WBC: 19.9 10*3/uL — ABNORMAL HIGH (ref 4.0–10.5)

## 2017-07-03 LAB — GLUCOSE, CAPILLARY
Glucose-Capillary: 91 mg/dL (ref 65–99)
Glucose-Capillary: 118 mg/dL — ABNORMAL HIGH (ref 65–99)

## 2017-07-03 LAB — MAGNESIUM: Magnesium: 1.8 mg/dL (ref 1.7–2.4)

## 2017-07-03 MED ORDER — ACETAMINOPHEN 325 MG PO TABS: 650.0000 mg | ORAL_TABLET | Freq: Four times a day (QID) | ORAL | Status: DC | PRN

## 2017-07-03 MED ORDER — HYDRALAZINE HCL 20 MG/ML IJ SOLN: 10.0000 mg | INTRAMUSCULAR | Status: DC | PRN

## 2017-07-03 MED ORDER — LOSARTAN POTASSIUM 50 MG PO TABS
100.0000 mg | ORAL_TABLET | Freq: Every day | ORAL | Status: DC
  Administered 2017-07-03 – 2017-07-08 (×6): 100 mg via ORAL
  Filled 2017-07-03 (×6): qty 2

## 2017-07-03 MED ORDER — ALBUTEROL SULFATE (2.5 MG/3ML) 0.083% IN NEBU
2.5000 mg | INHALATION_SOLUTION | RESPIRATORY_TRACT | Status: DC | PRN
Start: 2017-07-03 — End: 2017-07-08
  Administered 2017-07-06: 2.5 mg via RESPIRATORY_TRACT
  Filled 2017-07-03: qty 3

## 2017-07-03 MED ORDER — NITROGLYCERIN 0.4 MG SL SUBL: 0.4000 mg | SUBLINGUAL_TABLET | SUBLINGUAL | Status: DC | PRN

## 2017-07-03 MED ORDER — TRAZODONE HCL 50 MG PO TABS
50.0000 mg | ORAL_TABLET | Freq: Every day | ORAL | Status: DC
  Administered 2017-07-03 – 2017-07-07 (×5): 50 mg via ORAL
  Filled 2017-07-03 (×5): qty 1

## 2017-07-03 MED ORDER — FLUOXETINE HCL 10 MG PO CAPS
10.0000 mg | ORAL_CAPSULE | Freq: Every day | ORAL | Status: DC
  Administered 2017-07-03 – 2017-07-08 (×6): 10 mg via ORAL
  Filled 2017-07-03 (×6): qty 1

## 2017-07-03 MED ORDER — ACETAMINOPHEN 650 MG RE SUPP: 650.0000 mg | Freq: Four times a day (QID) | RECTAL | Status: DC | PRN

## 2017-07-03 MED ORDER — PRAVASTATIN SODIUM 40 MG PO TABS
40.0000 mg | ORAL_TABLET | Freq: Every day | ORAL | Status: DC
  Administered 2017-07-03 – 2017-07-07 (×5): 40 mg via ORAL
  Filled 2017-07-03 (×5): qty 1

## 2017-07-03 MED ORDER — METOPROLOL TARTRATE 12.5 MG HALF TABLET
12.5000 mg | ORAL_TABLET | Freq: Every day | ORAL | Status: DC
  Administered 2017-07-04 – 2017-07-07 (×4): 12.5 mg via ORAL
  Filled 2017-07-03 (×4): qty 1

## 2017-07-03 MED ORDER — DABIGATRAN ETEXILATE MESYLATE 75 MG PO CAPS
75.0000 mg | ORAL_CAPSULE | Freq: Two times a day (BID) | ORAL | Status: DC
  Administered 2017-07-03 – 2017-07-08 (×11): 75 mg via ORAL
  Filled 2017-07-03 (×13): qty 1

## 2017-07-03 MED ORDER — ONDANSETRON HCL 4 MG/2ML IJ SOLN: 4.0000 mg | Freq: Four times a day (QID) | INTRAMUSCULAR | Status: DC | PRN

## 2017-07-03 MED ORDER — INSULIN ASPART 100 UNIT/ML ~~LOC~~ SOLN: 0.0000 [IU] | Freq: Three times a day (TID) | SUBCUTANEOUS | Status: DC

## 2017-07-03 MED ORDER — SODIUM CHLORIDE 0.9 % IV SOLN
INTRAVENOUS | Status: AC
  Administered 2017-07-03 (×2): via INTRAVENOUS

## 2017-07-03 MED ORDER — ONDANSETRON HCL 4 MG PO TABS: 4.0000 mg | ORAL_TABLET | Freq: Four times a day (QID) | ORAL | Status: DC | PRN

## 2017-07-03 MED ORDER — BACID PO TABS
2.0000 | ORAL_TABLET | Freq: Three times a day (TID) | ORAL | Status: DC
  Administered 2017-07-03 – 2017-07-08 (×16): 2 via ORAL
  Filled 2017-07-03 (×19): qty 2

## 2017-07-03 NOTE — Progress Notes (Signed)
PROGRESS NOTE    Patient: Diane [name]     PCP: Lajean Manes, MD                    DOB: 11/20/1940            DOA: 07/02/2017 YSA:[id number]             DOS: 07/03/2017, 11:11 AM   Date of Service: the patient was seen and examined on 07/03/2017 Subjective:   Patient was seen and examined this morning.  Denies any chest pain or shortness of breath.  Denies any fever or chills.  Stable no acute distress.  Still reporting of having diarrhea. With some improvement. ----------------------------------------------------------------------------------------------------------------------  Brief Narrative:  Diane D Ricard is a 77 y.o.  American female with multiple medical issues including atrial fibrillation, diabetes mellitus, hypertension, hyperlipidemia, was recently admitted to the hospital and was treated for pneumonia.  Patient was discharged on Levaquin.  Subsequently patient developed diarrhea since discharge last week. Upon admission she denied having any fever, chills, nausea, vomiting.  Only diarrhea, watery.  Foul-smelling. Patient was getting progressively weak with some falls yesterday but no physical trauma, or hitting her head.  Denies any dizziness, visual changes or asymmetric weaknesses.  In the ER patient was found to be stool for C. difficile positive.  Has been having multiple episodes of diarrhea in the ER patient was started on vancomycin p.o. for C. difficile.  Started on IV fluids.      Principal Problem:   C. difficile colitis Active Problems:   HYPERTENSION, BENIGN ESSENTIAL   Cancer of central portion of right female breast ([place])   CKD (chronic kidney disease) stage 3, GFR 30-59 ml/min (HCC)   Diabetes mellitus type 2 in obese (HCC)   PAF (paroxysmal atrial fibrillation) ([place])   Assessment & Plan:   C. difficile colitis with severe diarrhea -  We will continue to monitor patient closely, continue IV fluid resuscitation, Continue p.o.  Vancomycin We will add supportive measures such as lactobacillus, encourage p.o. dietary intake such as yogurt  A. fib presently rate controlled.  On Pradaxa and beta-blockers.  Hypertension - holding hydrochlorothiazide due to severe diarrhea.  And getting hydration.  Will  keep patient on metoprolol with as needed IV hydralazine.  Diabetes mellitus type 2 -  we will keep patient on sliding scale coverage.  Chronic kidney disease stage III - creatinine appears to be at baseline.  Hyperlipidemia - cont.  statins.  Possible CHF -  presently holding of diuretics.   DVT prophylaxis: Lovenox. Code Status: Full code. Family Communication: Discussed with patient. Disposition Plan: Home. Consults called: None. Admission status: Inpatient.    Consultants:   None   Procedures:  No admission procedures for hospital encounter.  Antimicrobials:  Anti-infectives (From admission, onward)   Start     Dose/Rate Route Frequency Ordered Stop   07/02/17 2230  vancomycin (VANCOCIN) 50 mg/mL oral solution 125 mg     125 mg Oral 4 times daily 07/02/17 2222 07/12/17 2159       Objective: Vitals:   07/03/17 0600 07/03/17 0630 07/03/17 0700 07/03/17 0715  BP: (!) 136/53 (!) 146/66 (!) 125/57 (!) 134/51  Pulse: (!) 59 63 60 71  Resp: (!) 25 16 (!) 26 (!) 22  Temp:      TempSrc:      SpO2: 99% 100% 96% 95%  Weight:      Height:       No intake or  output data in the 24 hours ending 07/03/17 1111 Filed Weights   07/02/17 1641  Weight: 90.7 kg (200 lb)    Examination:  General exam: Appears calm and comfortable  Respiratory system: Clear to auscultation. Respiratory effort normal. Cardiovascular system: S1 & S2 heard, RRR. No JVD, murmurs, rubs, gallops or clicks. No pedal edema. Gastrointestinal system: Abdomen is nondistended, soft and nontender. No organomegaly or masses felt. Normal bowel sounds heard. Central nervous system: Alert and oriented. No focal neurological  deficits. Extremities: Symmetric 5 x 5 power. Skin: No rashes, lesions or ulcers Psychiatry: Judgement and insight appear normal. Mood & affect appropriate.     Data Reviewed: I have personally reviewed following labs and imaging studies  CBC: Recent Labs  Lab 07/02/17 1729  WBC 17.1*  NEUTROABS 14.8*  HGB 10.9*  HCT 34.3*  MCV 81.9  PLT 440   Basic Metabolic Panel: Recent Labs  Lab 07/02/17 1729  NA 137  K 3.4*  CL 107  CO2 19*  GLUCOSE 108*  BUN 17  CREATININE 1.51*  CALCIUM 8.5*   GFR: Estimated Creatinine Clearance: 37.8 mL/min (A) (by C-G formula based on SCr of 1.51 mg/dL (H)). Liver Function Tests: Recent Labs  Lab 07/02/17 1729  AST 16  ALT 14  ALKPHOS 70  BILITOT 0.7  PROT 6.7  ALBUMIN 3.3*   No results for input(s): LIPASE, AMYLASE in the last 168 hours. No results for input(s): AMMONIA in the last 168 hours. Coagulation Profile: No results for input(s): INR, PROTIME in the last 168 hours. Cardiac Enzymes: No results for input(s): CKTOTAL, CKMB, CKMBINDEX, TROPONINI in the last 168 hours. BNP (last 3 results) No results for input(s): PROBNP in the last 8760 hours. HbA1C: No results for input(s): HGBA1C in the last 72 hours. CBG: Recent Labs  Lab 07/03/17 0741  GLUCAP 89   Lipid Profile: No results for input(s): CHOL, HDL, LDLCALC, TRIG, CHOLHDL, LDLDIRECT in the last 72 hours. Thyroid Function Tests: No results for input(s): TSH, T4TOTAL, FREET4, T3FREE, THYROIDAB in the last 72 hours. Anemia Panel: No results for input(s): VITAMINB12, FOLATE, FERRITIN, TIBC, IRON, RETICCTPCT in the last 72 hours. Sepsis Labs: Recent Labs  Lab 07/02/17 2244  LATICACIDVEN 0.93    Recent Results (from the past 240 hour(s))  C difficile quick scan w PCR reflex     Status: Abnormal   Collection Time: 07/02/17  6:06 PM  Result Value Ref Range Status   C Diff antigen POSITIVE (A) NEGATIVE Final   C Diff toxin POSITIVE (A) NEGATIVE Final   C Diff  interpretation Toxin producing C. difficile detected.  Final    Comment: CRITICAL RESULT CALLED TO, READ BACK BY AND VERIFIED WITH: CALLED TO M.MYERS,RN AT 2206 BY L.PITT 07/02/17.        Radiology Studies: Dg Chest 2 View  Result Date: 07/02/2017 CLINICAL DATA:  Weakness with fall EXAM: CHEST  2 VIEW COMPARISON:  June 28, 2017 FINDINGS: There is no evident edema or consolidation. There is persistent cardiomegaly with mild pulmonary venous hypertension. No appreciable pleural effusion. There is aortic atherosclerosis. No adenopathy. No pneumothorax. No appreciable bone lesions. IMPRESSION: Pulmonary vascular congestion without edema or consolidation. No pneumothorax. There is aortic atherosclerosis. Aortic Atherosclerosis (ICD10-I70.0). Electronically Signed   By: Lowella Grip III M.D.   On: 07/02/2017 18:09    Scheduled Meds: . dabigatran  75 mg Oral Q12H  . FLUoxetine  10 mg Oral Daily  . insulin aspart  0-9 Units Subcutaneous TID WC  .  lactobacillus acidophilus  2 tablet Oral TID  . losartan  100 mg Oral Daily  . [START ON 07/04/2017] metoprolol tartrate  12.5 mg Oral QHS  . pravastatin  40 mg Oral QHS  . traZODone  50 mg Oral QHS  . vancomycin  125 mg Oral QID   Continuous Infusions: . sodium chloride 75 mL/hr at 07/03/17 0516     LOS: 0 days    Time spent: >25 minutes   Deatra James, MD [facility] Pager [phone]  If 7PM-7AM, please contact night-coverage [email or web address] Password [facility] 07/03/2017, 11:11 AM  Patient was seen and examined this morning

## 2017-07-03 NOTE — ED Notes (Signed)
Heart Healthy Diet Ordered for Lunch. 

## 2017-07-03 NOTE — ED Notes (Signed)
Pharmacy contacted to send pt's meds

## 2017-07-03 NOTE — ED Notes (Signed)
Patient CBG was 68, Nurse Vicente Males was informed.

## 2017-07-03 NOTE — H&P (Signed)
History and Physical    Diane D Ulrey WOE:[id number] DOB: Nov 21, 1940 DOA: 07/02/2017  PCP: Lajean Manes, MD  Patient coming from: Home.  Chief Complaint: Diarrhea.  HPI: Diane [name] is a 77 y.o. female with history of atrial fibrillation, diabetes mellitus, hypertension, hyperlipidemia was recently admitted for possible pneumonia and discharged home on Levaquin with progressively worsening diarrhea since discharge last week.  Denies any abdominal pain nausea or vomiting denies any fever or chills.  Patient was progressively feeling weak and had a fall yesterday and at that point the patient's daughter called EMS.  Patient was unable to get up from the following position.  Denies hitting her head or losing consciousness.  ED Course: In the ER patient was found to be stool for C. difficile positive.  Has been having multiple episodes of diarrhea in the ER patient was started on vancomycin p.o. for C. difficile.  Started on IV fluids.  Admitted for severe diarrhea with C. difficile colitis.  Review of Systems: As per HPI, rest all negative.   Past Medical History:  Diagnosis Date  . Anxiety   . Arthritis    back  . Atrial fibrillation ([place])   . Breast cancer ([place])    right breast cancer - 3 years ago  . Coronary artery disease   . Depression   . Diabetes mellitus without complication (HCC)    diet  . Dysrhythmia    afib  . Fibromyalgia   . Herniated disc   . Hyperlipemia   . Hypertension   . Iron deficiency anemia   . Lower back pain   . Lumbar stenosis    L4-5  . S/P radiation therapy 01/23/2013-03/06/2013   Right breast / 46 Gy in 23 fractions / Right breast boost / 14 Gy in 7 fractions  . Spondylolysis   . 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 Status post chemotherapy 11/04/2012 - 01/06/2013.   Docetaxel/Cytoxan Q 3 Weeks x 4 cycles  . Torn rotator cuff     Past Surgical History:  Procedure Laterality Date  . ABDOMINAL  HYSTERECTOMY    . BREAST BIOPSY Right 09/04/2012  . BREAST LUMPECTOMY Right 10/08/2012  . COLONOSCOPY    . DILATION AND CURETTAGE OF UTERUS    . LEFT HEART CATHETERIZATION WITH CORONARY ANGIOGRAM N/A 09/14/2014   Procedure: LEFT HEART CATHETERIZATION WITH CORONARY ANGIOGRAM;  Surgeon: [name], MD;  [facility] C[facility];  Service: Cardiovascular;  Laterality: N/A;  . OPEN REDUCTION INTERNAL FIXATION (ORIF) PROXIMAL PHALANX Right 01/04/2016   Procedure: OPEN TREATMENT OF RIGHT THUMB PROXIMAL PHALANX FRACTURE;  Surgeon: Milly Jakob, MD;  Location: [place];  Service: Orthopedics;  Laterality: Right;  . PARTIAL MASTECTOMY WITH NEEDLE LOCALIZATION AND AXILLARY SENTINEL LYMPH NODE BX Right 10/08/2012   Procedure: RIGHT PARTIAL MASTECTOMY WITH NEEDLE LOCALIZATION AND RIGHT AXILLARY SENTINEL LYMPH NODE BX;  Surgeon: Adin Hector, MD;  Location: [place];  Service: General;  Laterality: Right;  Injection of 1% Methylene Blue dye into right breast  . PORTACATH PLACEMENT Left 10/08/2012   Procedure: INSERTION PORT-A-CATH WITH ULTRASOUND GUIDANCE;  Surgeon: Adin Hector, MD;  Location: [place];  Service: General;  Laterality: Left;  Using 82F [place]  . Posterior Lumbar Arthrodesis, Pedicle screw fixation, Posterolateral Arthodesis  03/17/11  . SHOULDER ARTHROSCOPY WITH ROTATOR CUFF REPAIR AND SUBACROMIAL DECOMPRESSION  06/04/2012   Procedure: SHOULDER ARTHROSCOPY WITH ROTATOR CUFF REPAIR AND SUBACROMIAL DECOMPRESSION;  Surgeon: Nita Sells, MD;  Location: [place];  Service: Orthopedics;  Laterality: Left;  LEFT SHOULDER ARTHROSCOPY WITH ROTATOR CUFF REPAIR AND SUBACROMIAL DECOMPRESSION AND DISTAL CLAVICLE RESECTION  . TONSILLECTOMY    . TUBAL LIGATION    . VULVECTOMY PARTIAL    . Wide Local Excision  01/2010   Bilateral groin excisions, re-excision of vulva     reports that she quit smoking about 8 years ago. Her smoking use included  cigarettes. She quit after 50.00 years of use. she has never used smokeless tobacco. She reports that she drinks alcohol. She reports that she does not use drugs.  Allergies  Allergen Reactions  . Shellfish Allergy Anaphylaxis    Family History  Problem Relation Age of Onset  . Stroke Mother   . Alzheimer's disease Mother   . Heart attack Father   . Heart attack Brother     Prior to Admission medications   Medication Sig Start Date End Date Taking? Authorizing Provider  acetaminophen (TYLENOL) 500 MG tablet Take 1,000 mg by mouth every 6 (six) hours as needed for headache (pain).   Yes [provider]  albuterol (PROVENTIL HFA;VENTOLIN HFA) 108 (90 BASE) MCG/ACT inhaler Inhale 2 puffs into the lungs every 4 (four) hours as needed for wheezing or shortness of breath. 05/12/15  Yes Melony Overly, MD  aspirin 81 MG tablet Take 1 tablet (81 mg total) by mouth every 6 (six) hours as needed for mild pain. Patient taking differently: Take 81 mg by mouth every 6 (six) hours as needed for pain.  06/24/17  Yes Dixie Dials, MD  dabigatran (PRADAXA) 75 MG CAPS Take 75 mg by mouth every 12 (twelve) hours.   Yes [provider]  FLUoxetine (PROZAC) 10 MG capsule Take 10 mg by mouth daily.   Yes [provider]  losartan (COZAAR) 100 MG tablet Take 1 tablet (100 mg total) by mouth daily. 09/15/14  Yes [name], MD  metFORMIN (GLUCOPHAGE-XR) 500 MG 24 hr tablet Take 1 tablet (500 mg total) by mouth daily with breakfast. Total dose of 1000mg  daily Patient taking differently: Take 500 mg by mouth daily.  09/16/14  Yes [name], MD  metoprolol tartrate (LOPRESSOR) 25 MG tablet Take 0.5 tablets (12.5 mg total) by mouth at bedtime. 06/24/17  Yes Dixie Dials, MD  NITROSTAT 0.4 MG SL tablet Place 0.4 mg under the tongue every 5 (five) minutes as needed for chest pain.  10/14/12  Yes [provider]  potassium chloride (K-DUR) 10 MEQ tablet Take 2 tablets (20 mEq  total) by mouth 2 (two) times daily. 06/24/17  Yes Dixie Dials, MD  pravastatin (PRAVACHOL) 40 MG tablet Take 40 mg by mouth at bedtime.    Yes [provider]  traZODone (DESYREL) 50 MG tablet Take 50 mg by mouth at bedtime.   Yes [provider]  triamterene-hydrochlorothiazide (DYAZIDE) 37.5-25 MG per capsule Take 1 capsule by mouth daily.  08/10/14  Yes [provider]  levofloxacin (LEVAQUIN) 500 MG tablet Take 1 tablet (500 mg total) by mouth daily. Patient not taking: Reported on 07/02/2017 06/24/17   Dixie Dials, MD    Physical Exam: Vitals:   07/02/17 1641 07/02/17 1730 07/02/17 1830 07/02/17 2041  BP:  (!) 145/66 (!) 142/55   Pulse:  63 62   Resp:  (!) 22 (!) 23   Temp:    (!) 101.2 F (38.4 C)  TempSrc:    Oral  SpO2:  94% 94%  Weight: 90.7 kg (200 lb)     Height: 5\' 7"  (1.702 m)         Constitutional: Moderately built and nourished. Vitals:   07/02/17 1641 07/02/17 1730 07/02/17 1830 07/02/17 2041  BP:  (!) 145/66 (!) 142/55   Pulse:  63 62   Resp:  (!) 22 (!) 23   Temp:    (!) 101.2 F (38.4 C)  TempSrc:    Oral  SpO2:  94% 94%   Weight: 90.7 kg (200 lb)     Height: 5\' 7"  (1.702 m)      Eyes: Anicteric no pallor. ENMT: No discharge from the ears eyes nose or mouth. Neck: No mass felt.  No neck rigidity.  No JVD appreciated. Respiratory: No rhonchi or crepitations. Cardiovascular: S1-S2 heard no murmurs appreciated. Abdomen: Soft nontender bowel sounds present. Musculoskeletal: No edema.  No joint effusion. Skin: No rash.  Skin appears warm. Neurologic:  alert awake oriented to time place and person.  Moves all extremities. Psychiatric: Appears normal.  Normal affect.   Labs on Admission: I have personally reviewed following labs and imaging studies  CBC: Recent Labs  Lab 07/02/17 1729  WBC 17.1*  NEUTROABS 14.8*  HGB 10.9*  HCT 34.3*  MCV 81.9  PLT 562   Basic Metabolic Panel: Recent Labs  Lab 07/02/17 1729  NA  137  K 3.4*  CL 107  CO2 19*  GLUCOSE 108*  BUN 17  CREATININE 1.51*  CALCIUM 8.5*   GFR: Estimated Creatinine Clearance: 37.8 mL/min (A) (by C-G formula based on SCr of 1.51 mg/dL (H)). Liver Function Tests: Recent Labs  Lab 07/02/17 1729  AST 16  ALT 14  ALKPHOS 70  BILITOT 0.7  PROT 6.7  ALBUMIN 3.3*   No results for input(s): LIPASE, AMYLASE in the last 168 hours. No results for input(s): AMMONIA in the last 168 hours. Coagulation Profile: No results for input(s): INR, PROTIME in the last 168 hours. Cardiac Enzymes: No results for input(s): CKTOTAL, CKMB, CKMBINDEX, TROPONINI in the last 168 hours. BNP (last 3 results) No results for input(s): PROBNP in the last 8760 hours. HbA1C: No results for input(s): HGBA1C in the last 72 hours. CBG: No results for input(s): GLUCAP in the last 168 hours. Lipid Profile: No results for input(s): CHOL, HDL, LDLCALC, TRIG, CHOLHDL, LDLDIRECT in the last 72 hours. Thyroid Function Tests: No results for input(s): TSH, T4TOTAL, FREET4, T3FREE, THYROIDAB in the last 72 hours. Anemia Panel: No results for input(s): VITAMINB12, FOLATE, FERRITIN, TIBC, IRON, RETICCTPCT in the last 72 hours. Urine analysis:    Component Value Date/Time   COLORURINE YELLOW 07/02/2017 2224   APPEARANCEUR CLEAR 07/02/2017 2224   LABSPEC 1.016 07/02/2017 2224   LABSPEC 1.020 12/24/2012 1156   PHURINE 5.0 07/02/2017 2224   GLUCOSEU NEGATIVE 07/02/2017 2224   GLUCOSEU Negative 12/24/2012 1156   HGBUR NEGATIVE 07/02/2017 2224   HGBUR negative 11/04/2009 1005   BILIRUBINUR NEGATIVE 07/02/2017 2224   BILIRUBINUR Negative 12/24/2012 1156   KETONESUR NEGATIVE 07/02/2017 2224   PROTEINUR NEGATIVE 07/02/2017 2224   UROBILINOGEN 0.2 12/24/2012 1156   NITRITE NEGATIVE 07/02/2017 2224   LEUKOCYTESUR NEGATIVE 07/02/2017 2224   LEUKOCYTESUR Trace 12/24/2012 1156   Sepsis Labs: @LABRCNTIP (procalcitonin:4,lacticidven:4) ) Recent Results (from the past 240  hour(s))  C difficile quick scan w PCR reflex     Status: Abnormal   Collection Time: 07/02/17  6:06 PM  Result Value Ref Range Status   C Diff antigen POSITIVE (A) NEGATIVE Final  C Diff toxin POSITIVE (A) NEGATIVE Final   C Diff interpretation Toxin producing C. difficile detected.  Final    Comment: CRITICAL RESULT CALLED TO, READ BACK BY AND VERIFIED WITH: CALLED TO M.MYERS,RN AT 2206 BY L.PITT 07/02/17.      Radiological Exams on Admission: Dg Chest 2 View  Result Date: 07/02/2017 CLINICAL DATA:  Weakness with fall EXAM: CHEST  2 VIEW COMPARISON:  June 28, 2017 FINDINGS: There is no evident edema or consolidation. There is persistent cardiomegaly with mild pulmonary venous hypertension. No appreciable pleural effusion. There is aortic atherosclerosis. No adenopathy. No pneumothorax. No appreciable bone lesions. IMPRESSION: Pulmonary vascular congestion without edema or consolidation. No pneumothorax. There is aortic atherosclerosis. Aortic Atherosclerosis (ICD10-I70.0). Electronically Signed   By: Lowella Grip III M.D.   On: 07/02/2017 18:09    EKG: Independently reviewed.  A. fib rate controlled.  Assessment/Plan Principal Problem:   C. 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 C. difficile colitis with severe diarrhea -patient has been placed on IV fluids and p.o. vancomycin per pharmacy for C. difficile colitis.  Continue with gentle hydration and hold diuretics for now.  Patient is having significant diarrhea. 2. A. fib presently rate controlled.  On Pradaxa and beta-blockers. 3. Hypertension holding hydrochlorothiazide due to severe diarrhea.  And getting hydration.  Will keep patient on metoprolol with as needed IV  hydralazine. 4. Diabetes mellitus type 2 we will keep patient on sliding scale coverage. 5. Chronic kidney disease stage III creatinine appears to be at baseline. 6. Hyperlipidemia on statins. 7. Possible CHF presently holding of diuretics.   DVT prophylaxis: Lovenox. Code Status: Full code. Family Communication: Discussed with patient. Disposition Plan: Home. Consults called: None. Admission status: Inpatient.   Rise Patience MD [facility] Pager [phone].  If 7PM-7AM, please contact night-coverage [email or web address] Password [email or web address]  07/03/2017, 2:04 AM

## 2017-07-03 NOTE — Progress Notes (Signed)
Pt admitted to the unit at 1456. Pt mental status is A&O x 4. Pt oriented to room, staff, and call bell. Skin is intact except where otherwise charted. Full assessment charted in [facility]. Call bell within reach. Visitor guidelines reviewed w/ pt and/or family.

## 2017-07-03 NOTE — ED Notes (Signed)
Pt had episode of bowel incontinence, pt cleaned and linens replaced. Pt placed in clean brief.

## 2017-07-04 ENCOUNTER — Ambulatory Visit: Payer: Medicare Other | Admitting: Podiatry

## 2017-07-04 DIAGNOSIS — C50111 Malignant neoplasm of central portion of right female breast: Secondary | ICD-10-CM

## 2017-07-04 DIAGNOSIS — E669 Obesity, unspecified: Secondary | ICD-10-CM

## 2017-07-04 DIAGNOSIS — I480 Paroxysmal atrial fibrillation: Secondary | ICD-10-CM

## 2017-07-04 DIAGNOSIS — A0472 Enterocolitis due to Clostridium difficile, not specified as recurrent: Secondary | ICD-10-CM

## 2017-07-04 DIAGNOSIS — E1169 Type 2 diabetes mellitus with other specified complication: Secondary | ICD-10-CM

## 2017-07-04 DIAGNOSIS — D72829 Elevated white blood cell count, unspecified: Secondary | ICD-10-CM

## 2017-07-04 DIAGNOSIS — I10 Essential (primary) hypertension: Secondary | ICD-10-CM

## 2017-07-04 DIAGNOSIS — N183 Chronic kidney disease, stage 3 (moderate): Secondary | ICD-10-CM

## 2017-07-04 DIAGNOSIS — R0600 Dyspnea, unspecified: Secondary | ICD-10-CM

## 2017-07-04 DIAGNOSIS — R509 Fever, unspecified: Secondary | ICD-10-CM

## 2017-07-04 DIAGNOSIS — B9689 Other specified bacterial agents as the cause of diseases classified elsewhere: Secondary | ICD-10-CM

## 2017-07-04 DIAGNOSIS — R0689 Other abnormalities of breathing: Secondary | ICD-10-CM

## 2017-07-04 LAB — BASIC METABOLIC PANEL
Anion gap: 10 (ref 5–15)
BUN: 8 mg/dL (ref 6–20)
CO2: 18 mmol/L — ABNORMAL LOW (ref 22–32)
Calcium: 8.2 mg/dL — ABNORMAL LOW (ref 8.9–10.3)
Chloride: 111 mmol/L (ref 101–111)
Creatinine, Ser: 1.22 mg/dL — ABNORMAL HIGH (ref 0.44–1.00)
GFR calc Af Amer: 49 mL/min — ABNORMAL LOW (ref >60)
GFR calc non Af Amer: 43 mL/min — ABNORMAL LOW (ref >60)
Glucose, Bld: 103 mg/dL — ABNORMAL HIGH (ref 65–99)
Potassium: 3.2 mmol/L — ABNORMAL LOW (ref 3.5–5.1)
Sodium: 139 mmol/L (ref 135–145)

## 2017-07-04 LAB — URINE CULTURE: Culture: NO GROWTH

## 2017-07-04 LAB — GLUCOSE, CAPILLARY
Glucose-Capillary: 82 mg/dL (ref 65–99)
Glucose-Capillary: 91 mg/dL (ref 65–99)
Glucose-Capillary: 96 mg/dL (ref 65–99)
Glucose-Capillary: 88 mg/dL (ref 65–99)

## 2017-07-04 MED ORDER — POTASSIUM CHLORIDE CRYS ER 20 MEQ PO TBCR
40.0000 meq | EXTENDED_RELEASE_TABLET | ORAL | Status: AC
  Administered 2017-07-04 (×2): 40 meq via ORAL
  Filled 2017-07-04 (×2): qty 2

## 2017-07-04 NOTE — Progress Notes (Signed)
PROGRESS NOTE    Diane [name]  MEQ:[id number] DOB: 1940/09/13 DOA: 07/02/2017 PCP: Lajean Manes, MD   Chief Complaint  Patient presents with  . Fall  . Weakness    Brief Narrative:  HPI on 07/03/2017 by Dr. Gean Birchwood Diane D Biebel is a 77 y.o. female with history of atrial fibrillation, diabetes mellitus, hypertension, hyperlipidemia was recently admitted for possible pneumonia and discharged home on Levaquin with progressively worsening diarrhea since discharge last week.  Denies any abdominal pain nausea or vomiting denies any fever or chills.  Patient was progressively feeling weak and had a fall yesterday and at that point the patient's daughter called EMS.  Patient was unable to get up from the following position.  Denies hitting her head or losing consciousness.  Interim history Admitted for Cdiff.  Assessment & Plan   Sepsis secondary toC. difficile colitis with severe diarrhea  -patient was recently admitted for pneumonia and placed on antibiotics -Presented with fever, leukocytosis -Cdiff PCR + -Continue vancomycin, IV fluids, lactobacillus  Atrial fibrillation -Currently rate controlled, continue Pradaxa and metoprolol   Essential hypertension -Continue metoprolol -HCTZ held   Diabetes mellitus, type II -Continue insulin sliding scale and CBG monitoring  Chronic kidney disease, stage III -Creatinine appears to be at baseline, continue to monitor BMP  Hyperlipidemia -Continue statin  Chronic diastolic heart failure -Currently appears to be euvolemic and compensated -Echocardiogram 06/20/2017 showed an EF of 29-79%, grade 2 diastolic dysfunction -Monitor intake and output, daily weights -HCTZ held  Hypokalemia/hypomagnesemia -Replace and continue to monitor  Depression -Continue Prozac  DVT Prophylaxis  pradaxa  Code Status: Full  Family Communication: None at bedside  Disposition Plan: Admitted.    Consultants none  Procedures  none  Antibiotics   Anti-infectives (From admission, onward)   Start     Dose/Rate Route Frequency Ordered Stop   07/02/17 2230  vancomycin (VANCOCIN) 50 mg/mL oral solution 125 mg     125 mg Oral 4 times daily 07/02/17 2222 07/12/17 2159      Subjective:   Diane Spratlin seen and examined today.  Continues to complain of diarrhea and feeling weak.  Denies current chest pain, shortness of breath, abdominal pain, nausea or vomiting.  Objective:   Vitals:   07/03/17 2216 07/04/17 0500 07/04/17 0634 07/04/17 1323  BP: (!) 131/47  (!) 108/58 (!) 124/47  Pulse: 66  (!) 57 65  Resp: 17  17 17   Temp: 97.9 F (36.6 C)  98.1 F (36.7 C) 98.1 F (36.7 C)  TempSrc: Oral  Oral Oral  SpO2: 100%  99% 100%  Weight:  90.5 kg (199 lb 8.3 oz)    Height:        Intake/Output Summary (Last 24 hours) at 07/04/2017 1332 Last data filed at 07/04/2017 0900 Gross per 24 hour  Intake 1299 ml  Output -  Net 1299 ml   Filed Weights   07/02/17 1641 07/04/17 0500  Weight: 90.7 kg (200 lb) 90.5 kg (199 lb 8.3 oz)    Exam  General: Well developed, well nourished, NAD, appears stated age  HEENT: NCAT, mucous membranes moist.   Cardiovascular: S1 S2 auscultated, irregular  Respiratory: Clear to auscultation bilaterally  Abdomen: Soft, nontender, nondistended, + bowel sounds  Extremities: warm dry without cyanosis clubbing or edema  Neuro: AAOx3, nonfocal  Psych: Normal affect and demeanor, pleasant   Data Reviewed: I have personally reviewed following labs and imaging studies  CBC: Recent Labs  Lab 07/02/17 1729 07/03/17 1032  WBC 17.1* 19.9*  NEUTROABS 14.8*  --   HGB 10.9* 10.2*  HCT 34.3* 32.1*  MCV 81.9 82.5  PLT 193 440   Basic Metabolic Panel: Recent Labs  Lab 07/02/17 1729 07/03/17 1032 07/04/17 0424  NA 137  --  139  K 3.4*  --  3.2*  CL 107  --  111  CO2 19*  --  18*  GLUCOSE 108*  --  103*  BUN 17  --  8  CREATININE  1.51*  --  1.22*  CALCIUM 8.5*  --  8.2*  MG  --  1.8  --    GFR: Estimated Creatinine Clearance: 46.8 mL/min (A) (by C-G formula based on SCr of 1.22 mg/dL (H)). Liver Function Tests: Recent Labs  Lab 07/02/17 1729  AST 16  ALT 14  ALKPHOS 70  BILITOT 0.7  PROT 6.7  ALBUMIN 3.3*   No results for input(s): LIPASE, AMYLASE in the last 168 hours. No results for input(s): AMMONIA in the last 168 hours. Coagulation Profile: No results for input(s): INR, PROTIME in the last 168 hours. Cardiac Enzymes: No results for input(s): CKTOTAL, CKMB, CKMBINDEX, TROPONINI in the last 168 hours. BNP (last 3 results) No results for input(s): PROBNP in the last 8760 hours. HbA1C: No results for input(s): HGBA1C in the last 72 hours. CBG: Recent Labs  Lab 07/03/17 1229 07/03/17 1604 07/03/17 2214 07/04/17 0839 07/04/17 1229  GLUCAP 113* 91 118* 82 91   Lipid Profile: No results for input(s): CHOL, HDL, LDLCALC, TRIG, CHOLHDL, LDLDIRECT in the last 72 hours. Thyroid Function Tests: No results for input(s): TSH, T4TOTAL, FREET4, T3FREE, THYROIDAB in the last 72 hours. Anemia Panel: No results for input(s): VITAMINB12, FOLATE, FERRITIN, TIBC, IRON, RETICCTPCT in the last 72 hours. Urine analysis:    Component Value Date/Time   COLORURINE YELLOW 07/02/2017 [place] 07/02/2017 2224   LABSPEC 1.016 07/02/2017 2224   LABSPEC 1.020 12/24/2012 1156   PHURINE 5.0 07/02/2017 2224   GLUCOSEU NEGATIVE 07/02/2017 2224   GLUCOSEU Negative 12/24/2012 1156   HGBUR NEGATIVE 07/02/2017 2224   HGBUR negative 11/04/2009 1005   BILIRUBINUR NEGATIVE 07/02/2017 2224   BILIRUBINUR Negative 12/24/2012 [place] 07/02/2017 2224   PROTEINUR NEGATIVE 07/02/2017 2224   UROBILINOGEN 0.2 12/24/2012 1156   NITRITE NEGATIVE 07/02/2017 2224   LEUKOCYTESUR NEGATIVE 07/02/2017 2224   LEUKOCYTESUR Trace 12/24/2012 1156   Sepsis  Labs: @LABRCNTIP (procalcitonin:4,lacticidven:4)  ) Recent Results (from the past 240 hour(s))  Gastrointestinal Panel by PCR , Stool     Status: None   Collection Time: 07/02/17  6:06 PM  Result Value Ref Range Status   Campylobacter species NOT DETECTED NOT DETECTED Final   Plesimonas shigelloides NOT DETECTED NOT DETECTED Final   Salmonella species NOT DETECTED NOT DETECTED Final   Yersinia enterocolitica NOT DETECTED NOT DETECTED Final   Vibrio species NOT DETECTED NOT DETECTED Final   Vibrio cholerae NOT DETECTED NOT DETECTED Final   Enteroaggregative E coli (EAEC) NOT DETECTED NOT DETECTED Final   Enteropathogenic E coli (EPEC) NOT DETECTED NOT DETECTED Final   Enterotoxigenic E coli (ETEC) NOT DETECTED NOT DETECTED Final   Shiga like toxin producing E coli (STEC) NOT DETECTED NOT DETECTED Final   Shigella/Enteroinvasive E coli (EIEC) NOT DETECTED NOT DETECTED Final   Cryptosporidium NOT DETECTED NOT DETECTED Final   Cyclospora cayetanensis NOT DETECTED NOT DETECTED Final   Entamoeba histolytica NOT DETECTED NOT DETECTED Final   Giardia lamblia NOT DETECTED NOT  DETECTED Final   Adenovirus F40/41 NOT DETECTED NOT DETECTED Final   Astrovirus NOT DETECTED NOT DETECTED Final   Norovirus GI/GII NOT DETECTED NOT DETECTED Final   Rotavirus A NOT DETECTED NOT DETECTED Final   Sapovirus (I, II, IV, and V) NOT DETECTED NOT DETECTED Final    Comment: Performed at [facility], [address] [place], [place] [address]  C difficile quick scan w PCR reflex     Status: Abnormal   Collection Time: 07/02/17  6:06 PM  Result Value Ref Range Status   C Diff antigen POSITIVE (A) NEGATIVE Final   C Diff toxin POSITIVE (A) NEGATIVE Final   C Diff interpretation Toxin producing C. difficile detected.  Final    Comment: CRITICAL RESULT CALLED TO, READ BACK BY AND VERIFIED WITH: CALLED TO M.MYERS,RN AT 2206 BY L.PITT 07/02/17.   Urine Culture     Status: None   Collection Time: 07/02/17  10:24 PM  Result Value Ref Range Status   Specimen Description URINE, CATHETERIZED  Final   Special Requests NONE  Final   Culture   Final    NO GROWTH Performed at [facility], [address] [place], [place] [address]    Report Status 07/04/2017 FINAL  Final      Radiology Studies: Dg Chest 2 View  Result Date: 07/02/2017 CLINICAL DATA:  Weakness with fall EXAM: CHEST  2 VIEW COMPARISON:  June 28, 2017 FINDINGS: There is no evident edema or consolidation. There is persistent cardiomegaly with mild pulmonary venous hypertension. No appreciable pleural effusion. There is aortic atherosclerosis. No adenopathy. No pneumothorax. No appreciable bone lesions. IMPRESSION: Pulmonary vascular congestion without edema or consolidation. No pneumothorax. There is aortic atherosclerosis. Aortic Atherosclerosis (ICD10-I70.0). Electronically Signed   By: Lowella Grip III M.D.   On: 07/02/2017 18:09     Scheduled Meds: . dabigatran  75 mg Oral Q12H  . FLUoxetine  10 mg Oral Daily  . insulin aspart  0-9 Units Subcutaneous TID WC  . lactobacillus acidophilus  2 tablet Oral TID  . losartan  100 mg Oral Daily  . metoprolol tartrate  12.5 mg Oral QHS  . potassium chloride  40 mEq Oral Q4H  . pravastatin  40 mg Oral QHS  . traZODone  50 mg Oral QHS  . vancomycin  125 mg Oral QID   Continuous Infusions:   LOS: 1 day   Time Spent in minutes   30 minutes  Maryann Mikhail D.O. on 07/04/2017 at 1:32 PM  Between 7am to 7pm - Pager - [phone]  After 7pm go to [email or web address] - password [email or web address]  And look for the night coverage person covering for me after hours  [facility] Office  [phone]

## 2017-07-05 LAB — TROPONIN I: Troponin I: <0.03 ng/mL (ref <0.03)

## 2017-07-05 LAB — BASIC METABOLIC PANEL
Anion gap: 10 (ref 5–15)
BUN: 9 mg/dL (ref 6–20)
CO2: 17 mmol/L — ABNORMAL LOW (ref 22–32)
Calcium: 8.5 mg/dL — ABNORMAL LOW (ref 8.9–10.3)
Chloride: 111 mmol/L (ref 101–111)
Creatinine, Ser: 1.29 mg/dL — ABNORMAL HIGH (ref 0.44–1.00)
GFR calc Af Amer: 46 mL/min — ABNORMAL LOW (ref >60)
GFR calc non Af Amer: 40 mL/min — ABNORMAL LOW (ref >60)
Glucose, Bld: 96 mg/dL (ref 65–99)
Potassium: 4.4 mmol/L (ref 3.5–5.1)
Sodium: 138 mmol/L (ref 135–145)

## 2017-07-05 LAB — CBC
HCT: 28.4 % — ABNORMAL LOW (ref 36.0–46.0)
Hemoglobin: 9 g/dL — ABNORMAL LOW (ref 12.0–15.0)
MCH: 25.9 pg — ABNORMAL LOW (ref 26.0–34.0)
MCHC: 31.7 g/dL (ref 30.0–36.0)
MCV: 81.6 fL (ref 78.0–100.0)
Platelets: 176 10*3/uL (ref 150–400)
RBC: 3.48 MIL/uL — ABNORMAL LOW (ref 3.87–5.11)
RDW: 17.7 % — ABNORMAL HIGH (ref 11.5–15.5)
WBC: 6.7 10*3/uL (ref 4.0–10.5)

## 2017-07-05 LAB — GLUCOSE, CAPILLARY
Glucose-Capillary: 96 mg/dL (ref 65–99)
Glucose-Capillary: 101 mg/dL — ABNORMAL HIGH (ref 65–99)
Glucose-Capillary: 96 mg/dL (ref 65–99)
Glucose-Capillary: 106 mg/dL — ABNORMAL HIGH (ref 65–99)

## 2017-07-05 LAB — MAGNESIUM: Magnesium: 1.9 mg/dL (ref 1.7–2.4)

## 2017-07-05 NOTE — Progress Notes (Signed)
PROGRESS NOTE    Diane [name]  QMV:[id number] DOB: 1940-07-22 DOA: 07/02/2017 PCP: Lajean Manes, MD   Chief Complaint  Patient presents with  . Fall  . Weakness    Brief Narrative:  HPI on 07/03/2017 by Dr. Gean Birchwood Diane [name] is a 77 y.o. female with history of atrial fibrillation, diabetes mellitus, hypertension, hyperlipidemia was recently admitted for possible pneumonia and discharged home on Levaquin with progressively worsening diarrhea since discharge last week.  Denies any abdominal pain nausea or vomiting denies any fever or chills.  Patient was progressively feeling weak and had a fall yesterday and at that point the patient's daughter called EMS.  Patient was unable to get up from the following position.  Denies hitting her head or losing consciousness.  Interim history Admitted for Cdiff, continue PO vanc. Assessment & Plan   Sepsis secondary toC. difficile colitis with severe diarrhea  -patient was recently admitted for pneumonia and placed on antibiotics -Presented with fever, leukocytosis -Cdiff PCR + -Continue oral vancomycin, IV fluids, lactobacillus -States she has had one episode of diarrhea this morning  Atrial fibrillation -Currently rate controlled, continue Pradaxa and metoprolol  -Patient felt she had atrial fibrillation episode yesterday evening.  Currently appears to be back in sinus rhythm-  Chest pain -Patient complained of chest pain overnight when she felt an episode of atrial fibrillation -EKG and troponin ordered  Essential hypertension -Continue metoprolol -HCTZ held   Diabetes mellitus, type II -Continue insulin sliding scale and CBG monitoring  Chronic kidney disease, stage III -Creatinine appears to be at baseline, continue to monitor BMP  Hyperlipidemia -Continue statin  Chronic diastolic heart failure -Currently appears to be euvolemic and compensated -Echocardiogram 06/20/2017 showed an EF of 55-60%,  grade 2 diastolic dysfunction -Monitor intake and output, daily weights -HCTZ held- will restart today as patient states she felt some "fluid" yesterday  Hypokalemia/hypomagnesemia -Resolved with replacement -continue to monitor   Depression -Continue Prozac  DVT Prophylaxis  pradaxa  Code Status: Full  Family Communication: None at bedside  Disposition Plan: Admitted. Likely home in 24-48 hours  Consultants none  Procedures  none  Antibiotics   Anti-infectives (From admission, onward)   Start     Dose/Rate Route Frequency Ordered Stop   07/02/17 2230  vancomycin (VANCOCIN) 50 mg/mL oral solution 125 mg     125 mg Oral 4 times daily 07/02/17 2222 07/12/17 2159      Subjective:   Diane [name] seen and examined today.  Continues to complain of diarrhea.  States she had one episode this morning.  Patient also states she had some chest pain and felt she had an episode of atrial fibrillation overnight.  No longer complaining of chest pain.  Felt like she had a fluid type sensation in her lungs yesterday evening.  Currently denies abdominal pain, nausea or vomiting, dizziness or headache.  Objective:   Vitals:   07/04/17 1323 07/04/17 2112 07/05/17 0452 07/05/17 1011  BP: (!) 124/47  108/68 122/63  Pulse: 65 68 (!) 54 77  Resp: 17 16 18    Temp: 98.1 F (36.7 C) (!) 97.3 F (36.3 C) 97.9 F (36.6 C)   TempSrc: Oral Oral Oral   SpO2: 100% 97% 99%   Weight:      Height:        Intake/Output Summary (Last 24 hours) at 07/05/2017 1109 Last data filed at 07/05/2017 0928 Gross per 24 hour  Intake 260 ml  Output 300 ml  Net -40 ml  Filed Weights   07/02/17 1641 07/04/17 0500  Weight: 90.7 kg (200 lb) 90.5 kg (199 lb 8.3 oz)   Exam  General: Well developed, well nourished, NAD, appears stated age  23: NCAT, mucous membranes moist.   Cardiovascular: S1 S2 auscultated, RRR  Respiratory: Clear to auscultation bilaterally with equal chest rise  Abdomen:  Soft, nontender, nondistended, + bowel sounds  Extremities: warm dry without cyanosis clubbing or edema  Neuro: AAOx3, nonfocal  Psych: Normal affect and demeanor, pleasant  Data Reviewed: I have personally reviewed following labs and imaging studies  CBC: Recent Labs  Lab 07/02/17 1729 07/03/17 1032 07/05/17 0324  WBC 17.1* 19.9* 6.7  NEUTROABS 14.8*  --   --   HGB 10.9* 10.2* 9.0*  HCT 34.3* 32.1* 28.4*  MCV 81.9 82.5 81.6  PLT 193 198 893   Basic Metabolic Panel: Recent Labs  Lab 07/02/17 1729 07/03/17 1032 07/04/17 0424 07/05/17 0324  NA 137  --  139 138  K 3.4*  --  3.2* 4.4  CL 107  --  111 111  CO2 19*  --  18* 17*  GLUCOSE 108*  --  103* 96  BUN 17  --  8 9  CREATININE 1.51*  --  1.22* 1.29*  CALCIUM 8.5*  --  8.2* 8.5*  MG  --  1.8  --  1.9   GFR: Estimated Creatinine Clearance: 44.2 mL/min (A) (by C-G formula based on SCr of 1.29 mg/dL (H)). Liver Function Tests: Recent Labs  Lab 07/02/17 1729  AST 16  ALT 14  ALKPHOS 70  BILITOT 0.7  PROT 6.7  ALBUMIN 3.3*   No results for input(s): LIPASE, AMYLASE in the last 168 hours. No results for input(s): AMMONIA in the last 168 hours. Coagulation Profile: No results for input(s): INR, PROTIME in the last 168 hours. Cardiac Enzymes: No results for input(s): CKTOTAL, CKMB, CKMBINDEX, TROPONINI in the last 168 hours. BNP (last 3 results) No results for input(s): PROBNP in the last 8760 hours. HbA1C: No results for input(s): HGBA1C in the last 72 hours. CBG: Recent Labs  Lab 07/04/17 0839 07/04/17 1229 07/04/17 1703 07/04/17 2110 07/05/17 0744  GLUCAP 82 91 96 88 96   Lipid Profile: No results for input(s): CHOL, HDL, LDLCALC, TRIG, CHOLHDL, LDLDIRECT in the last 72 hours. Thyroid Function Tests: No results for input(s): TSH, T4TOTAL, FREET4, T3FREE, THYROIDAB in the last 72 hours. Anemia Panel: No results for input(s): VITAMINB12, FOLATE, FERRITIN, TIBC, IRON, RETICCTPCT in the last 72  hours. Urine analysis:    Component Value Date/Time   COLORURINE YELLOW 07/02/2017 2224   APPEARANCEUR CLEAR 07/02/2017 2224   LABSPEC 1.016 07/02/2017 2224   LABSPEC 1.020 12/24/2012 1156   PHURINE 5.0 07/02/2017 2224   GLUCOSEU NEGATIVE 07/02/2017 2224   GLUCOSEU Negative 12/24/2012 1156   HGBUR NEGATIVE 07/02/2017 2224   HGBUR negative 11/04/2009 1005   BILIRUBINUR NEGATIVE 07/02/2017 2224   BILIRUBINUR Negative 12/24/2012 1156   KETONESUR NEGATIVE 07/02/2017 2224   PROTEINUR NEGATIVE 07/02/2017 2224   UROBILINOGEN 0.2 12/24/2012 1156   NITRITE NEGATIVE 07/02/2017 2224   LEUKOCYTESUR NEGATIVE 07/02/2017 2224   LEUKOCYTESUR Trace 12/24/2012 1156   Sepsis Labs: @LABRCNTIP (procalcitonin:4,lacticidven:4)  ) Recent Results (from the past 240 hour(s))  Gastrointestinal Panel by PCR , Stool     Status: None   Collection Time: 07/02/17  6:06 PM  Result Value Ref Range Status   Campylobacter species NOT DETECTED NOT DETECTED Final   Plesimonas shigelloides NOT DETECTED NOT DETECTED  Final   Salmonella species NOT DETECTED NOT DETECTED Final   Yersinia enterocolitica NOT DETECTED NOT DETECTED Final   Vibrio species NOT DETECTED NOT DETECTED Final   Vibrio cholerae NOT DETECTED NOT DETECTED Final   Enteroaggregative E coli (EAEC) NOT DETECTED NOT DETECTED Final   Enteropathogenic E coli (EPEC) NOT DETECTED NOT DETECTED Final   Enterotoxigenic E coli (ETEC) NOT DETECTED NOT DETECTED Final   Shiga like toxin producing E coli (STEC) NOT DETECTED NOT DETECTED Final   Shigella/Enteroinvasive E coli (EIEC) NOT DETECTED NOT DETECTED Final   Cryptosporidium NOT DETECTED NOT DETECTED Final   Cyclospora cayetanensis NOT DETECTED NOT DETECTED Final   Entamoeba histolytica NOT DETECTED NOT DETECTED Final   Giardia lamblia NOT DETECTED NOT DETECTED Final   Adenovirus F40/41 NOT DETECTED NOT DETECTED Final   Astrovirus NOT DETECTED NOT DETECTED Final   Norovirus GI/GII NOT DETECTED NOT  DETECTED Final   Rotavirus A NOT DETECTED NOT DETECTED Final   Sapovirus (I, II, IV, and V) NOT DETECTED NOT DETECTED Final    Comment: Performed at [facility], [address] [place], [place] [address]  C difficile quick scan w PCR reflex     Status: Abnormal   Collection Time: 07/02/17  6:06 PM  Result Value Ref Range Status   C Diff antigen POSITIVE (A) NEGATIVE Final   C Diff toxin POSITIVE (A) NEGATIVE Final   C Diff interpretation Toxin producing C. difficile detected.  Final    Comment: CRITICAL RESULT CALLED TO, READ BACK BY AND VERIFIED WITH: CALLED TO M.MYERS,RN AT 2206 BY L.PITT 07/02/17.   Urine Culture     Status: None   Collection Time: 07/02/17 10:24 PM  Result Value Ref Range Status   Specimen Description URINE, CATHETERIZED  Final   Special Requests NONE  Final   Culture   Final    NO GROWTH Performed at [facility], [address] [place], [place] [address]    Report Status 07/04/2017 FINAL  Final  Blood culture (routine x 2)     Status: None (Preliminary result)   Collection Time: 07/02/17 10:30 PM  Result Value Ref Range Status   Specimen Description BLOOD RIGHT ARM  Final   Special Requests   Final    BOTTLES DRAWN AEROBIC AND ANAEROBIC Blood Culture adequate volume   Culture   Final    NO GROWTH 1 DAY Performed at [facility], [place] [address], [place],  [address]    Report Status PENDING  Incomplete  Blood culture (routine x 2)     Status: None (Preliminary result)   Collection Time: 07/02/17 10:30 PM  Result Value Ref Range Status   Specimen Description BLOOD LEFT ARM  Final   Special Requests   Final    BOTTLES DRAWN AEROBIC AND ANAEROBIC Blood Culture adequate volume   Culture   Final    NO GROWTH 1 DAY Performed at [facility], [place] [address] [place],  [address]    Report Status PENDING  Incomplete      Radiology Studies: No results found.   Scheduled Meds: . dabigatran  75 mg Oral Q12H  .  FLUoxetine  10 mg Oral Daily  . insulin aspart  0-9 Units Subcutaneous TID WC  . lactobacillus acidophilus  2 tablet Oral TID  . losartan  100 mg Oral Daily  . metoprolol tartrate  12.5 mg Oral QHS  . pravastatin  40 mg Oral QHS  .  traZODone  50 mg Oral QHS  . vancomycin  125 mg Oral QID   Continuous Infusions:   LOS: 2 days   Time Spent in minutes   45 minutes  Charlotte Brafford D.O. on 07/05/2017 at 11:09 AM  Between 7am to 7pm - Pager - [phone]  After 7pm go to [email or web address] - password [email or web address]  And look for the night coverage person covering for me after hours  [facility] Office  [phone]

## 2017-07-06 ENCOUNTER — Inpatient Hospital Stay (HOSPITAL_COMMUNITY): Payer: Medicare Other

## 2017-07-06 LAB — MAGNESIUM: Magnesium: 1.9 mg/dL (ref 1.7–2.4)

## 2017-07-06 LAB — CBC
HCT: 29.1 % — ABNORMAL LOW (ref 36.0–46.0)
Hemoglobin: 9.5 g/dL — ABNORMAL LOW (ref 12.0–15.0)
MCH: 26.8 pg (ref 26.0–34.0)
MCHC: 32.6 g/dL (ref 30.0–36.0)
MCV: 82 fL (ref 78.0–100.0)
Platelets: 210 10*3/uL (ref 150–400)
RBC: 3.55 MIL/uL — ABNORMAL LOW (ref 3.87–5.11)
RDW: 17.7 % — ABNORMAL HIGH (ref 11.5–15.5)
WBC: 5.9 10*3/uL (ref 4.0–10.5)

## 2017-07-06 LAB — BASIC METABOLIC PANEL
Anion gap: 11 (ref 5–15)
BUN: 11 mg/dL (ref 6–20)
CO2: 18 mmol/L — ABNORMAL LOW (ref 22–32)
Calcium: 8.7 mg/dL — ABNORMAL LOW (ref 8.9–10.3)
Chloride: 109 mmol/L (ref 101–111)
Creatinine, Ser: 1.36 mg/dL — ABNORMAL HIGH (ref 0.44–1.00)
GFR calc Af Amer: 43 mL/min — ABNORMAL LOW (ref >60)
GFR calc non Af Amer: 37 mL/min — ABNORMAL LOW (ref >60)
Glucose, Bld: 104 mg/dL — ABNORMAL HIGH (ref 65–99)
Potassium: 4.2 mmol/L (ref 3.5–5.1)
Sodium: 138 mmol/L (ref 135–145)

## 2017-07-06 LAB — GLUCOSE, CAPILLARY
Glucose-Capillary: 96 mg/dL (ref 65–99)
Glucose-Capillary: 122 mg/dL — ABNORMAL HIGH (ref 65–99)
Glucose-Capillary: 103 mg/dL — ABNORMAL HIGH (ref 65–99)
Glucose-Capillary: 97 mg/dL (ref 65–99)

## 2017-07-06 MED ORDER — FUROSEMIDE 10 MG/ML IJ SOLN
20.0000 mg | Freq: Once | INTRAMUSCULAR | Status: AC
  Administered 2017-07-06: 20 mg via INTRAVENOUS
  Filled 2017-07-06: qty 2

## 2017-07-06 NOTE — Progress Notes (Signed)
PROGRESS NOTE    Diane D Drabik  BTD:[id number] DOB: April 02, 1941 DOA: 07/02/2017 PCP: Lajean Manes, MD   Chief Complaint  Patient presents with  . Fall  . Weakness    Brief Narrative:  HPI on 07/03/2017 by Dr. Gean Birchwood Diane [name] is a 77 y.o. female with history of atrial fibrillation, diabetes mellitus, hypertension, hyperlipidemia was recently admitted for possible pneumonia and discharged home on Levaquin with progressively worsening diarrhea since discharge last week.  Denies any abdominal pain nausea or vomiting denies any fever or chills.  Patient was progressively feeling weak and had a fall yesterday and at that point the patient's daughter called EMS.  Patient was unable to get up from the following position.  Denies hitting her head or losing consciousness.  Interim history Admitted for Cdiff, continue PO vanc. Complaining of fluid in her chest.  Assessment & Plan   Sepsis secondary toC. difficile colitis with severe diarrhea  -patient was recently admitted for pneumonia and placed on antibiotics -Presented with fever, leukocytosis -Cdiff PCR + -Continue oral vancomycin, IV fluids, lactobacillus -States she has had two episodes of diarrhea this morning  Atrial fibrillation -Currently rate controlled, continue Pradaxa and metoprolol  Chest pain -Patient complained of chest pain overnight when she felt an episode of atrial fibrillation -EKG showed AF -troponin <0.03  Essential hypertension -Continue metoprolol -BP stable -HCTZ held   Diabetes mellitus, type II -Continue insulin sliding scale and CBG monitoring  Chronic kidney disease, stage III -Creatinine appears to be at baseline, continue to monitor BMP  Hyperlipidemia -Continue statin  Chronic diastolic heart failure -Continues to appear euvolemic and compensated -Echocardiogram 06/20/2017 showed an EF of 10-62%, grade 2 diastolic dysfunction -Monitor intake and output, daily  weights -will given dose of IV lasix -obtained CXR: cardiomegaly with possible interstitial edema -will obtain BNP  Questionable dyspnea -On exam, patient does not have any wheezing or crackles -Required DuoNeb treatment overnight  -Chest x-ray shows questionable interstitial edema   -Was given 1 dose of IV Lasix, will continue to monitor closely and obtain a BNP -Continue nebulizer treatments as needed as well as supplemental oxygen  Hypokalemia/hypomagnesemia -Resolved with replacement -continue to monitor   Depression -Continue Prozac  DVT Prophylaxis  pradaxa  Code Status: Full  Family Communication: None at bedside  Disposition Plan: Admitted. Likely home in 24-48 hours pending improvement   Consultants none  Procedures  none  Antibiotics   Anti-infectives (From admission, onward)   Start     Dose/Rate Route Frequency Ordered Stop   07/02/17 2230  vancomycin (VANCOCIN) 50 mg/mL oral solution 125 mg     125 mg Oral 4 times daily 07/02/17 2222 07/12/17 2159      Subjective:   Diane Martone seen and examined today.  Continues to complain of diarrhea, states she had 2 episodes this morning.  Also complains of feeling she has fluid on her chest and was short of breath overnight.  Patient required breathing treatment.  Currently denies chest pain, abdominal pain, nausea vomiting, dizziness or headache.  Objective:   Vitals:   07/05/17 2213 07/06/17 0622 07/06/17 0800 07/06/17 0856  BP: 129/90 (!) 149/61 (!) 148/59 138/74  Pulse: 62 80 74 66  Resp: 18 18 20    Temp: 98.3 F (36.8 C) 99.2 F (37.3 C) 98.2 F (36.8 C)   TempSrc: Oral Oral Oral   SpO2: 90% 94% 93%   Weight:      Height:        Intake/Output  Summary (Last 24 hours) at 07/06/2017 1303 Last data filed at 07/06/2017 1040 Gross per 24 hour  Intake 462 ml  Output -  Net 462 ml   Filed Weights   07/02/17 1641 07/04/17 0500  Weight: 90.7 kg (200 lb) 90.5 kg (199 lb 8.3 oz)   Exam  General:  Well developed, well nourished, NAD, appears stated age  2: NCAT, mucous membranes moist.   Cardiovascular: S1 S2 auscultated, irregular  Respiratory: Diminished breath sounds however no wheezing or crackles  Abdomen: Soft, nontender, nondistended, + bowel sounds  Extremities: warm dry without cyanosis clubbing or edema  Neuro: AAOx3, nonfocal  Psych: Appropriate   Data Reviewed: I have personally reviewed following labs and imaging studies  CBC: Recent Labs  Lab 07/02/17 1729 07/03/17 1032 07/05/17 0324 07/06/17 0521  WBC 17.1* 19.9* 6.7 5.9  NEUTROABS 14.8*  --   --   --   HGB 10.9* 10.2* 9.0* 9.5*  HCT 34.3* 32.1* 28.4* 29.1*  MCV 81.9 82.5 81.6 82.0  PLT 193 198 176 267   Basic Metabolic Panel: Recent Labs  Lab 07/02/17 1729 07/03/17 1032 07/04/17 0424 07/05/17 0324 07/06/17 0521  NA 137  --  139 138 138  K 3.4*  --  3.2* 4.4 4.2  CL 107  --  111 111 109  CO2 19*  --  18* 17* 18*  GLUCOSE 108*  --  103* 96 104*  BUN 17  --  8 9 11   CREATININE 1.51*  --  1.22* 1.29* 1.36*  CALCIUM 8.5*  --  8.2* 8.5* 8.7*  MG  --  1.8  --  1.9 1.9   GFR: Estimated Creatinine Clearance: 41.9 mL/min (A) (by C-G formula based on SCr of 1.36 mg/dL (H)). Liver Function Tests: Recent Labs  Lab 07/02/17 1729  AST 16  ALT 14  ALKPHOS 70  BILITOT 0.7  PROT 6.7  ALBUMIN 3.3*   No results for input(s): LIPASE, AMYLASE in the last 168 hours. No results for input(s): AMMONIA in the last 168 hours. Coagulation Profile: No results for input(s): INR, PROTIME in the last 168 hours. Cardiac Enzymes: Recent Labs  Lab 07/05/17 0942  TROPONINI <0.03   BNP (last 3 results) No results for input(s): PROBNP in the last 8760 hours. HbA1C: No results for input(s): HGBA1C in the last 72 hours. CBG: Recent Labs  Lab 07/05/17 1205 07/05/17 1606 07/05/17 2210 07/06/17 0814 07/06/17 1157  GLUCAP 101* 96 106* 96 122*   Lipid Profile: No results for input(s): CHOL, HDL,  LDLCALC, TRIG, CHOLHDL, LDLDIRECT in the last 72 hours. Thyroid Function Tests: No results for input(s): TSH, T4TOTAL, FREET4, T3FREE, THYROIDAB in the last 72 hours. Anemia Panel: No results for input(s): VITAMINB12, FOLATE, FERRITIN, TIBC, IRON, RETICCTPCT in the last 72 hours. Urine analysis:    Component Value Date/Time   COLORURINE YELLOW 07/02/2017 2224   APPEARANCEUR CLEAR 07/02/2017 2224   LABSPEC 1.016 07/02/2017 2224   LABSPEC 1.020 12/24/2012 1156   PHURINE 5.0 07/02/2017 2224   GLUCOSEU NEGATIVE 07/02/2017 2224   GLUCOSEU Negative 12/24/2012 1156   HGBUR NEGATIVE 07/02/2017 2224   HGBUR negative 11/04/2009 1005   BILIRUBINUR NEGATIVE 07/02/2017 2224   BILIRUBINUR Negative 12/24/2012 1156   KETONESUR NEGATIVE 07/02/2017 2224   PROTEINUR NEGATIVE 07/02/2017 2224   UROBILINOGEN 0.2 12/24/2012 1156   NITRITE NEGATIVE 07/02/2017 2224   LEUKOCYTESUR NEGATIVE 07/02/2017 2224   LEUKOCYTESUR Trace 12/24/2012 1156   Sepsis Labs: @LABRCNTIP (procalcitonin:4,lacticidven:4)  ) Recent Results (from the past  240 hour(s))  Gastrointestinal Panel by PCR , Stool     Status: None   Collection Time: 07/02/17  6:06 PM  Result Value Ref Range Status   Campylobacter species NOT DETECTED NOT DETECTED Final   Plesimonas shigelloides NOT DETECTED NOT DETECTED Final   Salmonella species NOT DETECTED NOT DETECTED Final   Yersinia enterocolitica NOT DETECTED NOT DETECTED Final   Vibrio species NOT DETECTED NOT DETECTED Final   Vibrio cholerae NOT DETECTED NOT DETECTED Final   Enteroaggregative E coli (EAEC) NOT DETECTED NOT DETECTED Final   Enteropathogenic E coli (EPEC) NOT DETECTED NOT DETECTED Final   Enterotoxigenic E coli (ETEC) NOT DETECTED NOT DETECTED Final   Shiga like toxin producing E coli (STEC) NOT DETECTED NOT DETECTED Final   Shigella/Enteroinvasive E coli (EIEC) NOT DETECTED NOT DETECTED Final   Cryptosporidium NOT DETECTED NOT DETECTED Final   Cyclospora cayetanensis NOT  DETECTED NOT DETECTED Final   Entamoeba histolytica NOT DETECTED NOT DETECTED Final   Giardia lamblia NOT DETECTED NOT DETECTED Final   Adenovirus F40/41 NOT DETECTED NOT DETECTED Final   Astrovirus NOT DETECTED NOT DETECTED Final   Norovirus GI/GII NOT DETECTED NOT DETECTED Final   Rotavirus A NOT DETECTED NOT DETECTED Final   Sapovirus (I, II, IV, and V) NOT DETECTED NOT DETECTED Final    Comment: Performed at [facility], [address] [place], [place] [address]  C difficile quick scan w PCR reflex     Status: Abnormal   Collection Time: 07/02/17  6:06 PM  Result Value Ref Range Status   C Diff antigen POSITIVE (A) NEGATIVE Final   C Diff toxin POSITIVE (A) NEGATIVE Final   C Diff interpretation Toxin producing C. difficile detected.  Final    Comment: CRITICAL RESULT CALLED TO, READ BACK BY AND VERIFIED WITH: CALLED TO M.MYERS,RN AT 2206 BY L.PITT 07/02/17.   Urine Culture     Status: None   Collection Time: 07/02/17 10:24 PM  Result Value Ref Range Status   Specimen Description URINE, CATHETERIZED  Final   Special Requests NONE  Final   Culture   Final    NO GROWTH Performed at [facility], [address] [place], [place] [address]    Report Status 07/04/2017 FINAL  Final  Blood culture (routine x 2)     Status: None (Preliminary result)   Collection Time: 07/02/17 10:30 PM  Result Value Ref Range Status   Specimen Description BLOOD RIGHT ARM  Final   Special Requests   Final    BOTTLES DRAWN AEROBIC AND ANAEROBIC Blood Culture adequate volume   Culture   Final    NO GROWTH 2 DAYS Performed at [facility], [place] [address] [place], [place] [address]    Report Status PENDING  Incomplete  Blood culture (routine x 2)     Status: None (Preliminary result)   Collection Time: 07/02/17 10:30 PM  Result Value Ref Range Status   Specimen Description BLOOD LEFT ARM  Final   Special Requests   Final    BOTTLES DRAWN AEROBIC AND ANAEROBIC Blood Culture  adequate volume   Culture   Final    NO GROWTH 2 DAYS Performed at [facility], [place] [address] [place], [place] [address]    Report Status PENDING  Incomplete      Radiology Studies: Dg Chest Port 1 View  Result Date: 07/06/2017 CLINICAL DATA:  Difficulty breathing EXAM: PORTABLE CHEST 1 VIEW COMPARISON:  07/02/2017  FINDINGS: Cardiomegaly. Diffuse interstitial prominence within the lungs, question interstitial edema. Right base atelectasis or infiltrate. No visible effusions or acute bony abnormality. IMPRESSION: Cardiomegaly with possible interstitial edema. Right base atelectasis or pneumonia. Electronically Signed   By: Rolm Baptise M.D.   On: 07/06/2017 11:22     Scheduled Meds: . dabigatran  75 mg Oral Q12H  . FLUoxetine  10 mg Oral Daily  . insulin aspart  0-9 Units Subcutaneous TID WC  . lactobacillus acidophilus  2 tablet Oral TID  . losartan  100 mg Oral Daily  . metoprolol tartrate  12.5 mg Oral QHS  . pravastatin  40 mg Oral QHS  . traZODone  50 mg Oral QHS  . vancomycin  125 mg Oral QID   Continuous Infusions:   LOS: 3 days   Time Spent in minutes   45 minutes  Maryann Mikhail D.O. on 07/06/2017 at 1:03 PM  Between 7am to 7pm - Pager - [phone]  After 7pm go to [email or web address] - password [email or web address]  And look for the night coverage person covering for me after hours  [facility] Office  [phone]

## 2017-07-07 LAB — GLUCOSE, CAPILLARY
Glucose-Capillary: 85 mg/dL (ref 65–99)
Glucose-Capillary: 93 mg/dL (ref 65–99)
Glucose-Capillary: 98 mg/dL (ref 65–99)
Glucose-Capillary: 119 mg/dL — ABNORMAL HIGH (ref 65–99)

## 2017-07-07 LAB — CBC
HCT: 28 % — ABNORMAL LOW (ref 36.0–46.0)
Hemoglobin: 9.1 g/dL — ABNORMAL LOW (ref 12.0–15.0)
MCH: 26 pg (ref 26.0–34.0)
MCHC: 32.5 g/dL (ref 30.0–36.0)
MCV: 80 fL (ref 78.0–100.0)
Platelets: 190 10*3/uL (ref 150–400)
RBC: 3.5 MIL/uL — ABNORMAL LOW (ref 3.87–5.11)
RDW: 17 % — ABNORMAL HIGH (ref 11.5–15.5)
WBC: 6.2 10*3/uL (ref 4.0–10.5)

## 2017-07-07 LAB — BASIC METABOLIC PANEL
Anion gap: 12 (ref 5–15)
BUN: 15 mg/dL (ref 6–20)
CO2: 18 mmol/L — ABNORMAL LOW (ref 22–32)
Calcium: 8.3 mg/dL — ABNORMAL LOW (ref 8.9–10.3)
Chloride: 108 mmol/L (ref 101–111)
Creatinine, Ser: 1.41 mg/dL — ABNORMAL HIGH (ref 0.44–1.00)
GFR calc Af Amer: 41 mL/min — ABNORMAL LOW (ref >60)
GFR calc non Af Amer: 36 mL/min — ABNORMAL LOW (ref >60)
Glucose, Bld: 96 mg/dL (ref 65–99)
Potassium: 4.9 mmol/L (ref 3.5–5.1)
Sodium: 138 mmol/L (ref 135–145)

## 2017-07-07 LAB — BRAIN NATRIURETIC PEPTIDE: B Natriuretic Peptide: 475.8 pg/mL — ABNORMAL HIGH (ref 0.0–100.0)

## 2017-07-07 LAB — MAGNESIUM: Magnesium: 1.9 mg/dL (ref 1.7–2.4)

## 2017-07-07 MED ORDER — TRIAMTERENE-HCTZ 37.5-25 MG PO TABS
1.0000 | ORAL_TABLET | Freq: Every day | ORAL | Status: DC
  Administered 2017-07-07 – 2017-07-08 (×2): 1 via ORAL
  Filled 2017-07-07 (×2): qty 1

## 2017-07-07 MED ORDER — LORATADINE 10 MG PO TABS
10.0000 mg | ORAL_TABLET | Freq: Every day | ORAL | Status: DC
  Administered 2017-07-07 – 2017-07-08 (×2): 10 mg via ORAL
  Filled 2017-07-07 (×2): qty 1

## 2017-07-07 MED ORDER — TRIAMTERENE-HCTZ 37.5-25 MG PO CAPS: 1.0000 | ORAL_CAPSULE | Freq: Every day | ORAL | Status: DC

## 2017-07-07 MED ORDER — FLUTICASONE PROPIONATE 50 MCG/ACT NA SUSP
1.0000 | Freq: Every day | NASAL | Status: DC
Start: 2017-07-07 — End: 2017-07-08
  Administered 2017-07-07 – 2017-07-08 (×2): 1 via NASAL
  Filled 2017-07-07: qty 16

## 2017-07-07 NOTE — Progress Notes (Signed)
PROGRESS NOTE    Diane D Slee  ZOX:[id number] DOB: [birth year] DOA: 07/02/2017 PCP: Lajean Manes, MD   Chief Complaint  Patient presents with  . Fall  . Weakness    Brief Narrative:  HPI on 07/03/2017 by Dr. Gean Birchwood Diane [name] is a 77 y.o. female with history of atrial fibrillation, diabetes mellitus, hypertension, hyperlipidemia was recently admitted for possible pneumonia and discharged home on Levaquin with progressively worsening diarrhea since discharge last week.  Denies any abdominal pain nausea or vomiting denies any fever or chills.  Patient was progressively feeling weak and had a fall yesterday and at that point the patient's daughter called EMS.  Patient was unable to get up from the following position.  Denies hitting her head or losing consciousness.  Interim history Admitted for Cdiff, continue PO vanc. Complaining of fluid in her chest.  Assessment & Plan   Sepsis secondary toC. difficile colitis with severe diarrhea  -patient was recently admitted for pneumonia and placed on antibiotics -Presented with fever, leukocytosis -Cdiff PCR + -Continue oral vancomycin, IV fluids, lactobacillus -States she has had two episodes of diarrhea this morning  Atrial fibrillation -Currently rate controlled, continue Pradaxa and metoprolol  Chest pain -Patient complained of chest pain overnight when she felt an episode of atrial fibrillation -EKG showed AF -troponin <0.03  Essential hypertension -Continue metoprolol -BP stable -HCTZ held- restart today  Diabetes mellitus, type II -Continue insulin sliding scale and CBG monitoring  Chronic kidney disease, stage III -Creatinine appears to be at baseline, continue to monitor BMP  Hyperlipidemia -Continue statin  Chronic diastolic heart failure -Continues to appear euvolemic and compensated -Echocardiogram 06/20/2017 showed an EF of 81-19%, grade 2 diastolic dysfunction -Monitor intake and output,  daily weights -Was given 1 dose of IV lasix -obtained CXR: cardiomegaly with possible interstitial edema -will start HCTZ  Questionable dyspnea -On exam, patient does not have any wheezing or crackles -Chest x-ray shows questionable interstitial edema   -Was given 1 dose of IV Lasix, will continue to monitor closely and obtain a BNP -Continue nebulizer treatments as needed as well as supplemental oxygen -improved  Hypokalemia/hypomagnesemia -Resolved with replacement -continue to monitor   Depression -Continue Prozac  DVT Prophylaxis  pradaxa  Code Status: Full  Family Communication: None at bedside  Disposition Plan: Admitted. Likely home in 24-48 hours pending improvement   Consultants none  Procedures  none  Antibiotics   Anti-infectives (From admission, onward)   Start     Dose/Rate Route Frequency Ordered Stop   07/02/17 2230  vancomycin (VANCOCIN) 50 mg/mL oral solution 125 mg     125 mg Oral 4 times daily 07/02/17 2222 07/12/17 2159      Subjective:   Diane [name] seen and examined today.  States she is only had one bowel movement today.  Had 3 total bowel movements yesterday.  Currently denies any abdominal pain, nausea or vomiting.  No longer complaining of chest pain or shortness of breath.  Objective:   Vitals:   07/06/17 1342 07/06/17 2155 07/07/17 0500 07/07/17 0543  BP: (!) 132/54 (!) 125/53  (!) 137/58  Pulse: 66 64  67  Resp: 20 18  18   Temp: 98.3 F (36.8 C) 97.9 F (36.6 C)  97.8 F (36.6 C)  TempSrc: Axillary Oral  Oral  SpO2: 98% 96%  92%  Weight:   92.5 kg (203 lb 14.8 oz)   Height:        Intake/Output Summary (Last 24 hours) at 07/07/2017  1139 Last data filed at 07/06/2017 1343 Gross per 24 hour  Intake 320 ml  Output -  Net 320 ml   Filed Weights   07/02/17 1641 07/04/17 0500 07/07/17 0500  Weight: 90.7 kg (200 lb) 90.5 kg (199 lb 8.3 oz) 92.5 kg (203 lb 14.8 oz)   Exam  General: Well developed, well nourished, NAD,  appears stated age  46: NCAT, mucous membranes moist.   Cardiovascular: S1 S2 auscultated, irregular  Respiratory: Clear to auscultation bilaterally with equal chest rise  Abdomen: Soft, nontender, nondistended, + bowel sounds  Extremities: warm dry without cyanosis clubbing or edema  Neuro: AAOx3, nonfocal  Psych: appropriate mood and affect, pleasant  Data Reviewed: I have personally reviewed following labs and imaging studies  CBC: Recent Labs  Lab 07/02/17 1729 07/03/17 1032 07/05/17 0324 07/06/17 0521 07/07/17 0238  WBC 17.1* 19.9* 6.7 5.9 6.2  NEUTROABS 14.8*  --   --   --   --   HGB 10.9* 10.2* 9.0* 9.5* 9.1*  HCT 34.3* 32.1* 28.4* 29.1* 28.0*  MCV 81.9 82.5 81.6 82.0 80.0  PLT 193 198 176 210 834   Basic Metabolic Panel: Recent Labs  Lab 07/02/17 1729 07/03/17 1032 07/04/17 0424 07/05/17 0324 07/06/17 0521 07/07/17 0238  NA 137  --  139 138 138 138  K 3.4*  --  3.2* 4.4 4.2 4.9  CL 107  --  111 111 109 108  CO2 19*  --  18* 17* 18* 18*  GLUCOSE 108*  --  103* 96 104* 96  BUN 17  --  8 9 11 15   CREATININE 1.51*  --  1.22* 1.29* 1.36* 1.41*  CALCIUM 8.5*  --  8.2* 8.5* 8.7* 8.3*  MG  --  1.8  --  1.9 1.9 1.9   GFR: Estimated Creatinine Clearance: 40.9 mL/min (A) (by C-G formula based on SCr of 1.41 mg/dL (H)). Liver Function Tests: Recent Labs  Lab 07/02/17 1729  AST 16  ALT 14  ALKPHOS 70  BILITOT 0.7  PROT 6.7  ALBUMIN 3.3*   No results for input(s): LIPASE, AMYLASE in the last 168 hours. No results for input(s): AMMONIA in the last 168 hours. Coagulation Profile: No results for input(s): INR, PROTIME in the last 168 hours. Cardiac Enzymes: Recent Labs  Lab 07/05/17 0942  TROPONINI <0.03   BNP (last 3 results) No results for input(s): PROBNP in the last 8760 hours. HbA1C: No results for input(s): HGBA1C in the last 72 hours. CBG: Recent Labs  Lab 07/06/17 0814 07/06/17 1157 07/06/17 1611 07/06/17 2157 07/07/17 0812    GLUCAP 96 122* 103* 97 85   Lipid Profile: No results for input(s): CHOL, HDL, LDLCALC, TRIG, CHOLHDL, LDLDIRECT in the last 72 hours. Thyroid Function Tests: No results for input(s): TSH, T4TOTAL, FREET4, T3FREE, THYROIDAB in the last 72 hours. Anemia Panel: No results for input(s): VITAMINB12, FOLATE, FERRITIN, TIBC, IRON, RETICCTPCT in the last 72 hours. Urine analysis:    Component Value Date/Time   COLORURINE YELLOW 07/02/2017 2224   APPEARANCEUR CLEAR 07/02/2017 2224   LABSPEC 1.016 07/02/2017 2224   LABSPEC 1.020 12/24/2012 1156   PHURINE 5.0 07/02/2017 2224   GLUCOSEU NEGATIVE 07/02/2017 2224   GLUCOSEU Negative 12/24/2012 1156   HGBUR NEGATIVE 07/02/2017 2224   HGBUR negative 11/04/2009 1005   BILIRUBINUR NEGATIVE 07/02/2017 2224   BILIRUBINUR Negative 12/24/2012 1156   KETONESUR NEGATIVE 07/02/2017 2224   PROTEINUR NEGATIVE 07/02/2017 2224   UROBILINOGEN 0.2 12/24/2012 1156   NITRITE  NEGATIVE 07/02/2017 2224   LEUKOCYTESUR NEGATIVE 07/02/2017 2224   LEUKOCYTESUR Trace 12/24/2012 1156   Sepsis Labs: @LABRCNTIP (procalcitonin:4,lacticidven:4)  ) Recent Results (from the past 240 hour(s))  Gastrointestinal Panel by PCR , Stool     Status: None   Collection Time: 07/02/17  6:06 PM  Result Value Ref Range Status   Campylobacter species NOT DETECTED NOT DETECTED Final   Plesimonas shigelloides NOT DETECTED NOT DETECTED Final   Salmonella species NOT DETECTED NOT DETECTED Final   Yersinia enterocolitica NOT DETECTED NOT DETECTED Final   Vibrio species NOT DETECTED NOT DETECTED Final   Vibrio cholerae NOT DETECTED NOT DETECTED Final   Enteroaggregative E coli (EAEC) NOT DETECTED NOT DETECTED Final   Enteropathogenic E coli (EPEC) NOT DETECTED NOT DETECTED Final   Enterotoxigenic E coli (ETEC) NOT DETECTED NOT DETECTED Final   Shiga like toxin producing E coli (STEC) NOT DETECTED NOT DETECTED Final   Shigella/Enteroinvasive E coli (EIEC) NOT DETECTED NOT DETECTED  Final   Cryptosporidium NOT DETECTED NOT DETECTED Final   Cyclospora cayetanensis NOT DETECTED NOT DETECTED Final   Entamoeba histolytica NOT DETECTED NOT DETECTED Final   Giardia lamblia NOT DETECTED NOT DETECTED Final   Adenovirus F40/41 NOT DETECTED NOT DETECTED Final   Astrovirus NOT DETECTED NOT DETECTED Final   Norovirus GI/GII NOT DETECTED NOT DETECTED Final   Rotavirus A NOT DETECTED NOT DETECTED Final   Sapovirus (I, II, IV, and V) NOT DETECTED NOT DETECTED Final    Comment: Performed at [facility], [address] [place], [place] [address]  C difficile quick scan w PCR reflex     Status: Abnormal   Collection Time: 07/02/17  6:06 PM  Result Value Ref Range Status   C Diff antigen POSITIVE (A) NEGATIVE Final   C Diff toxin POSITIVE (A) NEGATIVE Final   C Diff interpretation Toxin producing C. difficile detected.  Final    Comment: CRITICAL RESULT CALLED TO, READ BACK BY AND VERIFIED WITH: CALLED TO M.MYERS,RN AT 2206 BY L.PITT 07/02/17.   Urine Culture     Status: None   Collection Time: 07/02/17 10:24 PM  Result Value Ref Range Status   Specimen Description URINE, CATHETERIZED  Final   Special Requests NONE  Final   Culture   Final    NO GROWTH Performed at [facility], [address], [place], [place] [address]    Report Status 07/04/2017 FINAL  Final  Blood culture (routine x 2)     Status: None (Preliminary result)   Collection Time: 07/02/17 10:30 PM  Result Value Ref Range Status   Specimen Description BLOOD RIGHT ARM  Final   Special Requests   Final    BOTTLES DRAWN AEROBIC AND ANAEROBIC Blood Culture adequate volume   Culture   Final    NO GROWTH 4 DAYS Performed at [facility], [place] [address], [place], [place] [address]    Report Status PENDING  Incomplete  Blood culture (routine x 2)     Status: None (Preliminary result)   Collection Time: 07/02/17 10:30 PM  Result Value Ref Range Status   Specimen Description BLOOD LEFT ARM   Final   Special Requests   Final    BOTTLES DRAWN AEROBIC AND ANAEROBIC Blood Culture adequate volume   Culture   Final    NO GROWTH 4 DAYS Performed at [facility], [place] [address] [place], [place] [address]    Report Status PENDING  Incomplete  Radiology Studies: Dg Chest Port 1 View  Result Date: 07/06/2017 CLINICAL DATA:  Difficulty breathing EXAM: PORTABLE CHEST 1 VIEW COMPARISON:  07/02/2017 FINDINGS: Cardiomegaly. Diffuse interstitial prominence within the lungs, question interstitial edema. Right base atelectasis or infiltrate. No visible effusions or acute bony abnormality. IMPRESSION: Cardiomegaly with possible interstitial edema. Right base atelectasis or pneumonia. Electronically Signed   By: Rolm Baptise M.D.   On: 07/06/2017 11:22     Scheduled Meds: . dabigatran  75 mg Oral Q12H  . FLUoxetine  10 mg Oral Daily  . fluticasone  1 spray Each Nare Daily  . insulin aspart  0-9 Units Subcutaneous TID WC  . lactobacillus acidophilus  2 tablet Oral TID  . loratadine  10 mg Oral Daily  . losartan  100 mg Oral Daily  . metoprolol tartrate  12.5 mg Oral QHS  . pravastatin  40 mg Oral QHS  . traZODone  50 mg Oral QHS  . vancomycin  125 mg Oral QID   Continuous Infusions:   LOS: 4 days   Time Spent in minutes   30 minutes  Maryann Mikhail D.O. on 07/07/2017 at 11:39 AM  Between 7am to 7pm - Pager - [phone]  After 7pm go to [email or web address] - password [email or web address]  And look for the night coverage person covering for me after hours  [facility] Office  [phone]

## 2017-07-08 LAB — CULTURE, BLOOD (ROUTINE X 2)
Culture: NO GROWTH
Culture: NO GROWTH
Special Requests: ADEQUATE
Special Requests: ADEQUATE

## 2017-07-08 LAB — BASIC METABOLIC PANEL
Anion gap: 10 (ref 5–15)
BUN: 19 mg/dL (ref 6–20)
CO2: 19 mmol/L — ABNORMAL LOW (ref 22–32)
Calcium: 8.2 mg/dL — ABNORMAL LOW (ref 8.9–10.3)
Chloride: 107 mmol/L (ref 101–111)
Creatinine, Ser: 1.48 mg/dL — ABNORMAL HIGH (ref 0.44–1.00)
GFR calc Af Amer: 39 mL/min — ABNORMAL LOW (ref >60)
GFR calc non Af Amer: 34 mL/min — ABNORMAL LOW (ref >60)
Glucose, Bld: 106 mg/dL — ABNORMAL HIGH (ref 65–99)
Potassium: 4 mmol/L (ref 3.5–5.1)
Sodium: 136 mmol/L (ref 135–145)

## 2017-07-08 LAB — GLUCOSE, CAPILLARY
Glucose-Capillary: 114 mg/dL — ABNORMAL HIGH (ref 65–99)
Glucose-Capillary: 80 mg/dL (ref 65–99)

## 2017-07-08 MED ORDER — LORATADINE 10 MG PO TABS: 10.0000 mg | ORAL_TABLET | Freq: Every day | ORAL | Status: DC

## 2017-07-08 MED ORDER — BACID PO TABS: 2.0000 | ORAL_TABLET | Freq: Three times a day (TID) | ORAL | 0 refills | Status: DC

## 2017-07-08 MED ORDER — FLUTICASONE PROPIONATE 50 MCG/ACT NA SUSP: 1.0000 | Freq: Every day | NASAL | 0 refills | Status: DC

## 2017-07-08 MED ORDER — VANCOMYCIN HCL 125 MG PO CAPS: 125.0000 mg | ORAL_CAPSULE | Freq: Four times a day (QID) | ORAL | 0 refills | Status: DC

## 2017-07-08 NOTE — Progress Notes (Signed)
Patient discharge teaching given, including activity, diet, follow-up appoints, and medications. Patient verbalized understanding of all discharge instructions. IV access was d/c'd. Vitals are stable. Skin is intact except as charted in most recent assessments. Discharge prolonged due to patient awaiting family's arrival.  Pt to be escorted out by NT, to be driven home by family.

## 2017-07-08 NOTE — Discharge Summary (Signed)
Physician Discharge Summary  [place] D Brion VVO:[id number] DOB: Mar 30, 1941 DOA: 07/02/2017  PCP: Lajean Manes, MD  Admit date: 07/02/2017 Discharge date: 07/08/2017  Time spent: 45 minutes  Recommendations for Outpatient Follow-up:  Patient will be discharged to home.  Patient will need to follow up with primary care provider within one week of discharge.  Patient should continue medications as prescribed.  Patient should follow a heart healthy/carb modfiied diet.   Discharge Diagnoses:  Sepsis secondary to C. difficile colitis with severe diarrhea  Atrial fibrillation Chest pain Essential hypertension Diabetes mellitus, type II Chronic kidney disease, stage III Hyperlipidemia Acute on Chronic diastolic heart failure/Dyspnea Hypokalemia/hypomagnesemia Depression  Discharge Condition: Stable  Diet recommendation: Heart healthy/carb modified  Filed Weights   07/02/17 1641 07/04/17 0500 07/07/17 0500  Weight: 90.7 kg (200 lb) 90.5 kg (199 lb 8.3 oz) 92.5 kg (203 lb 14.8 oz)    History of present illness:  on 07/03/2017 by Dr. Gean Birchwood [place] D Stevensonis a 77 y.o.femalewithhistory of atrial fibrillation, diabetes mellitus, hypertension, hyperlipidemia was recently admitted for possible pneumonia and discharged home on Levaquin with progressively worsening diarrhea since discharge last week. Denies any abdominal pain nausea or vomiting denies any fever or chills. Patient was progressively feeling weak and had a fall yesterday and at that point the patient's daughter called EMS. Patient was unable to get up from the following position. Denies hitting her head or losing consciousness.  Hospital Course:  Sepsis secondary to C. difficile colitis with severe diarrhea  -patient was recently admitted for pneumonia and placed on antibiotics -Presented with fever, leukocytosis -Cdiff PCR + -Continue oral vancomycin. actobacillus -diarrhea improving, now formed  stools and fewer episodes  Atrial fibrillation -Currently rate controlled, continue Pradaxa and metoprolol  Chest pain -Patient complained of chest pain overnight when she felt an episode of atrial fibrillation -EKG showed AF -troponin <0.03  Essential hypertension -Continue metoprolol, dyazide, coozar  Diabetes mellitus, type II -Continue metformin on discharge  Chronic kidney disease, stage III -Creatinine appears to be at baseline, continue to monitor BMP  Hyperlipidemia -Continue statin  Acute on Chronic diastolic heart failure/Dyspnea -Continues to appear euvolemic and compensated -patient had some dyspnea- now resolved -Echocardiogram 06/20/2017 showed an EF of 26-94%, grade 2 diastolic dysfunction -Monitor intake and output, daily weights -Was given 1 dose of IV lasix -obtained CXR: cardiomegaly with possible interstitial edema -BNP 475.8 -Continue metoprolol, dyazide, coozar  Hypokalemia/hypomagnesemia -Resolved  Depression -Continue Prozac  Procedures: None  Consultations: None  Discharge Exam: Vitals:   07/07/17 2242 07/08/17 0546  BP: 118/60 (!) 97/44  Pulse:  64  Resp:  18  Temp:  97.6 F (36.4 C)  SpO2:  97%     General: Well developed, well nourished, NAD, appears stated age  HEENT: NCAT, mucous membranes moist.  Cardiovascular: S1 S2 auscultated, no murmur  Respiratory: Clear to auscultation bilaterally with equal chest rise  Abdomen: Soft, nontender, nondistended, + bowel sounds  Extremities: warm dry without cyanosis clubbing or edema  Neuro: AAOx3, nonfocal  Psych: Normal affect and demeanor with intact judgement and insight, pleasant  Discharge Instructions Discharge Instructions    Discharge instructions   Complete by:  As directed    Patient will be discharged to home.  Patient will need to follow up with primary care provider within one week of discharge.  Patient should continue medications as prescribed.   Patient should follow a heart healthy/carb modfiied diet.     Allergies as of 07/08/2017  Reactions   Shellfish Allergy Anaphylaxis      Medication List    STOP taking these medications   levofloxacin 500 MG tablet Commonly known as:  LEVAQUIN     TAKE these medications   acetaminophen 500 MG tablet Commonly known as:  TYLENOL Take 1,000 mg by mouth every 6 (six) hours as needed for headache (pain).   albuterol 108 (90 Base) MCG/ACT inhaler Commonly known as:  PROVENTIL HFA;VENTOLIN HFA Inhale 2 puffs into the lungs every 4 (four) hours as needed for wheezing or shortness of breath.   aspirin 81 MG tablet Take 1 tablet (81 mg total) by mouth every 6 (six) hours as needed for mild pain. What changed:  reasons to take this   dabigatran 75 MG Caps capsule Commonly known as:  PRADAXA Take 75 mg by mouth every 12 (twelve) hours.   FLUoxetine 10 MG capsule Commonly known as:  PROZAC Take 10 mg by mouth daily.   fluticasone 50 MCG/ACT nasal spray Commonly known as:  FLONASE Place 1 spray into both nostrils daily. Start taking on:  07/09/2017   lactobacillus acidophilus Tabs tablet Take 2 tablets by mouth 3 (three) times daily.   loratadine 10 MG tablet Commonly known as:  CLARITIN Take 1 tablet (10 mg total) by mouth daily. Start taking on:  07/09/2017   losartan 100 MG tablet Commonly known as:  COZAAR Take 1 tablet (100 mg total) by mouth daily.   metFORMIN 500 MG 24 hr tablet Commonly known as:  GLUCOPHAGE-XR Take 1 tablet (500 mg total) by mouth daily with breakfast. Total dose of 1000mg  daily What changed:    when to take this  additional instructions   metoprolol tartrate 25 MG tablet Commonly known as:  LOPRESSOR Take 0.5 tablets (12.5 mg total) by mouth at bedtime.   NITROSTAT 0.4 MG SL tablet Generic drug:  nitroGLYCERIN Place 0.4 mg under the tongue every 5 (five) minutes as needed for chest pain.   potassium chloride 10 MEQ tablet Commonly  known as:  K-DUR Take 2 tablets (20 mEq total) by mouth 2 (two) times daily.   pravastatin 40 MG tablet Commonly known as:  PRAVACHOL Take 40 mg by mouth at bedtime.   traZODone 50 MG tablet Commonly known as:  DESYREL Take 50 mg by mouth at bedtime.   triamterene-hydrochlorothiazide 37.5-25 MG capsule Commonly known as:  DYAZIDE Take 1 capsule by mouth daily.   vancomycin 125 MG capsule Commonly known as:  VANCOCIN Take 1 capsule (125 mg total) by mouth 4 (four) times daily.      Allergies  Allergen Reactions  . Shellfish Allergy Anaphylaxis   Follow-up Information    Stoneking, Hal, MD. Schedule an appointment as soon as possible for a visit in 1 week(s).   Specialty:  Internal Medicine Why:  Hospital follow up Contact information: [address] [place] [address] [phone]            The results of significant diagnostics from this hospitalization (including imaging, microbiology, ancillary and laboratory) are listed below for reference.    Significant Diagnostic Studies: Dg Chest 2 View  Result Date: 07/02/2017 CLINICAL DATA:  Weakness with fall EXAM: CHEST  2 VIEW COMPARISON:  June 28, 2017 FINDINGS: There is no evident edema or consolidation. There is persistent cardiomegaly with mild pulmonary venous hypertension. No appreciable pleural effusion. There is aortic atherosclerosis. No adenopathy. No pneumothorax. No appreciable bone lesions. IMPRESSION: Pulmonary vascular congestion without edema or consolidation. No  pneumothorax. There is aortic atherosclerosis. Aortic Atherosclerosis (ICD10-I70.0). Electronically Signed   By: Lowella Grip III M.D.   On: 07/02/2017 18:09   Dg Chest 2 View  Result Date: 06/28/2017 CLINICAL DATA:  Recent pneumonia EXAM: CHEST  2 VIEW COMPARISON:  06/22/2017 FINDINGS: The cardio pericardial silhouette is enlarged. No overt edema or focal lung consolidation. No evidence for pleural effusion The visualized  bony structures of the thorax are intact. IMPRESSION: Cardiomegaly with vascular congestion. No acute cardiopulmonary findings. Electronically Signed   By: Misty Stanley M.D.   On: 06/28/2017 16:24   Dg Chest 2 View  Result Date: 06/22/2017 CLINICAL DATA:  Shortness of breath, CHF. EXAM: CHEST  2 VIEW COMPARISON:  06/19/2017 FINDINGS: Cardiomegaly again noted. Decreased pulmonary vascular congestion and near complete resolution of bilateral interstitial and airspace opacities noted. Mild bibasilar atelectasis again noted. No pleural effusion or pneumothorax. IMPRESSION: Near-complete resolution of bilateral interstitial/airspace opacities/edema. Cardiomegaly and mild pulmonary vascular congestion persists. Electronically Signed   By: Margarette Canada M.D.   On: 06/22/2017 08:43   Dg Chest 2 View  Result Date: 06/19/2017 CLINICAL DATA:  Significant increase in dyspnea last evening. EXAM: CHEST  2 VIEW COMPARISON:  09/07/2016 FINDINGS: Stable cardiomegaly with aortic atherosclerosis. Central pulmonary vascular congestion with bilateral airspace opacities and small bilateral pleural effusions are suspicious for stigmata of CHF. Superimposed pneumonia would be difficult to entirely exclude. Surgical clips project over the right breast. IMPRESSION: Stable cardiomegaly with aortic atherosclerosis. Central pulmonary vascular congestion and bilateral patchy airspace opacities with small bilateral pleural effusions are more likely to represent changes of CHF. Superimposed pneumonia would be difficult to entirely exclude. Electronically Signed   By: Ashley Royalty M.D.   On: 06/19/2017 15:23   Nm Myocar Multi W/spect W/wall Motion / Ef  Result Date: 06/23/2017 CLINICAL DATA:  77 year old female with a history of chest pain. Cardiovascular risk factors include smoking, diabetes, hypertension, family history, hyperlipidemia, known coronary disease. EXAM: MYOCARDIAL IMAGING WITH SPECT (REST AND PHARMACOLOGIC-STRESS) GATED  LEFT VENTRICULAR WALL MOTION STUDY LEFT VENTRICULAR EJECTION FRACTION TECHNIQUE: Standard myocardial SPECT imaging was performed after resting intravenous injection of 10 mCi Tc-39m tetrofosmin. Subsequently, intravenous infusion of Lexiscan was performed under the supervision of the Cardiology staff. At peak effect of the drug, 30 mCi Tc-55m tetrofosmin was injected intravenously and standard myocardial SPECT imaging was performed. Quantitative gated imaging was also performed to evaluate left ventricular wall motion, and estimate left ventricular ejection fraction. COMPARISON:  None. FINDINGS: Perfusion: No decreased activity in the left ventricle on stress imaging to suggest reversible ischemia or infarction. Wall Motion: Hypokinetic inferior wall Left Ventricular Ejection Fraction: 56 % End diastolic volume 875 ml End systolic volume 75 ml IMPRESSION: 1. No reversible ischemia or infarction. 2. Hypokinetic inferior wall. 3. Left ventricular ejection fraction 56% 4. Non invasive risk stratification*: Low *2012 Appropriate Use Criteria for Coronary Revascularization Focused Update: J Am Coll Cardiol. 6433;29(5):188-416. http://content.airportbarriers.com.aspx?articleid=[email or web address] Electronically Signed   By: Corrie Mckusick D.O.   On: 06/23/2017 12:34   Dg Chest Port 1 View  Result Date: 07/06/2017 CLINICAL DATA:  Difficulty breathing EXAM: PORTABLE CHEST 1 VIEW COMPARISON:  07/02/2017 FINDINGS: Cardiomegaly. Diffuse interstitial prominence within the lungs, question interstitial edema. Right base atelectasis or infiltrate. No visible effusions or acute bony abnormality. IMPRESSION: Cardiomegaly with possible interstitial edema. Right base atelectasis or pneumonia. Electronically Signed   By: Rolm Baptise M.D.   On: 07/06/2017 11:22    Microbiology: Recent Results (from the past 240 hour(s))  Gastrointestinal Panel by PCR , Stool     Status: None   Collection Time: 07/02/17  6:06 PM  Result Value Ref Range  Status   Campylobacter species NOT DETECTED NOT DETECTED Final   Plesimonas shigelloides NOT DETECTED NOT DETECTED Final   Salmonella species NOT DETECTED NOT DETECTED Final   Yersinia enterocolitica NOT DETECTED NOT DETECTED Final   Vibrio species NOT DETECTED NOT DETECTED Final   Vibrio cholerae NOT DETECTED NOT DETECTED Final   Enteroaggregative E coli (EAEC) NOT DETECTED NOT DETECTED Final   Enteropathogenic E coli (EPEC) NOT DETECTED NOT DETECTED Final   Enterotoxigenic E coli (ETEC) NOT DETECTED NOT DETECTED Final   Shiga like toxin producing E coli (STEC) NOT DETECTED NOT DETECTED Final   Shigella/Enteroinvasive E coli (EIEC) NOT DETECTED NOT DETECTED Final   Cryptosporidium NOT DETECTED NOT DETECTED Final   Cyclospora cayetanensis NOT DETECTED NOT DETECTED Final   Entamoeba histolytica NOT DETECTED NOT DETECTED Final   Giardia lamblia NOT DETECTED NOT DETECTED Final   Adenovirus F40/41 NOT DETECTED NOT DETECTED Final   Astrovirus NOT DETECTED NOT DETECTED Final   Norovirus GI/GII NOT DETECTED NOT DETECTED Final   Rotavirus A NOT DETECTED NOT DETECTED Final   Sapovirus (I, II, IV, and V) NOT DETECTED NOT DETECTED Final    Comment: Performed at [facility], [address] [place], [place] [address]  C difficile quick scan w PCR reflex     Status: Abnormal   Collection Time: 07/02/17  6:06 PM  Result Value Ref Range Status   C Diff antigen POSITIVE (A) NEGATIVE Final   C Diff toxin POSITIVE (A) NEGATIVE Final   C Diff interpretation Toxin producing C. difficile detected.  Final    Comment: CRITICAL RESULT CALLED TO, READ BACK BY AND VERIFIED WITH: CALLED TO M.MYERS,RN AT 2206 BY L.PITT 07/02/17.   Urine Culture     Status: None   Collection Time: 07/02/17 10:24 PM  Result Value Ref Range Status   Specimen Description URINE, CATHETERIZED  Final   Special Requests NONE  Final   Culture   Final    NO GROWTH Performed at [facility], [address],  [place], [place] [address]    Report Status 07/04/2017 FINAL  Final  Blood culture (routine x 2)     Status: None   Collection Time: 07/02/17 10:30 PM  Result Value Ref Range Status   Specimen Description BLOOD RIGHT ARM  Final   Special Requests   Final    BOTTLES DRAWN AEROBIC AND ANAEROBIC Blood Culture adequate volume   Culture   Final    NO GROWTH 5 DAYS Performed at [facility], [place] [address] [place], [place] [address]    Report Status 07/08/2017 FINAL  Final  Blood culture (routine x 2)     Status: None   Collection Time: 07/02/17 10:30 PM  Result Value Ref Range Status   Specimen Description BLOOD LEFT ARM  Final   Special Requests   Final    BOTTLES DRAWN AEROBIC AND ANAEROBIC Blood Culture adequate volume   Culture   Final    NO GROWTH 5 DAYS Performed at [facility], [place] [address], [place],  [address]    Report Status 07/08/2017 FINAL  Final     Labs: Basic Metabolic Panel: Recent Labs  Lab 07/03/17 1032 07/04/17 0424 07/05/17 0324 07/06/17 0521 07/07/17 0238 07/08/17 0254  NA  --  139 138 138 138 136  K  --  3.2* 4.4 4.2 4.9 4.0  CL  --  111 111 109 108 107  CO2  --  18* 17* 18* 18* 19*  GLUCOSE  --  103* 96 104* 96 106*  BUN  --  8 9 11 15 19   CREATININE  --  1.22* 1.29* 1.36* 1.41* 1.48*  CALCIUM  --  8.2* 8.5* 8.7* 8.3* 8.2*  MG 1.8  --  1.9 1.9 1.9  --    Liver Function Tests: Recent Labs  Lab 07/02/17 1729  AST 16  ALT 14  ALKPHOS 70  BILITOT 0.7  PROT 6.7  ALBUMIN 3.3*   No results for input(s): LIPASE, AMYLASE in the last 168 hours. No results for input(s): AMMONIA in the last 168 hours. CBC: Recent Labs  Lab 07/02/17 1729 07/03/17 1032 07/05/17 0324 07/06/17 0521 07/07/17 0238  WBC 17.1* 19.9* 6.7 5.9 6.2  NEUTROABS 14.8*  --   --   --   --   HGB 10.9* 10.2* 9.0* 9.5* 9.1*  HCT 34.3* 32.1* 28.4* 29.1* 28.0*  MCV 81.9 82.5 81.6 82.0 80.0  PLT 193 198 176 210 190   Cardiac Enzymes: Recent Labs  Lab  07/05/17 0942  TROPONINI <0.03   BNP: BNP (last 3 results) Recent Labs    06/21/17 0607 06/22/17 0622 07/07/17 1218  BNP 382.4* 247.1* 475.8*    ProBNP (last 3 results) No results for input(s): PROBNP in the last 8760 hours.  CBG: Recent Labs  Lab 07/07/17 0812 07/07/17 1305 07/07/17 1738 07/07/17 2158 07/08/17 0730  GLUCAP 85 93 98 119* 114*       Signed:  Maryann Mikhail  [facility] 07/08/2017, 10:18 AM

## 2017-07-08 NOTE — Discharge Instructions (Signed)
Clostridium Difficile Infection  Clostridium difficile (C. difficile or C. diff) infection is a condition that causes inflammation of the large intestine (colon). This condition can result in damage to the lining of your colon and may lead to colitis. This infection can be passed from person to person (is contagious).  What are the causes?  C. diff is a bacterium that is normally found in the colon. This infection is caused when the balance of C. diff is changed and there is an overgrowth of C. diff. This is often caused by antibiotic use.  What increases the risk?  This condition is more likely to develop in people who:  · Take antibiotic medicines.  · Take a certain type of medicine called proton pump inhibitors over a long period of time (chronic use).  · Are older.  · Have had a C. diff infection before.  · Have serious underlying conditions, such as colon cancer.  · Are in the hospital.  · Have a weak defense (immune) system.  · Live in a place where there is a lot of contact with others, such as a nursing home.  · Have had gastrointestinal (GI) tract surgery.    What are the signs or symptoms?  Symptoms of this condition include:  · Diarrhea. This may be bloody, watery, or yellow or green in color.  · Fever.  · Fatigue.  · Loss of appetite.  · Nausea.  · Swelling, pain, or tenderness in the abdomen.  · Dehydration. Dehydration can cause you to be tired and thirsty, have a dry mouth, and urinate less frequently.    How is this diagnosed?  This condition is diagnosed with a medical history and physical exam. You may also have tests, including:  · A test that checks for C. diff in your stool.  · Blood tests.  · A sigmoidoscopy or colonoscopy to look at your colon. These procedures involve passing an instrument through your rectum to look at the inside of your colon.    How is this treated?  Treatment for this condition includes:  · Antibiotics that keep C. diff from growing.  · Stopping the antibiotics you were  on before the C. diff infection began. Only do this as told by your health care provider.  · Fluids through an IV tube, if you are dehydrated.  · Surgery to remove the infected part of the colon. This is rare.    Follow these instructions at home:  Eating and drinking  · Drink enough fluid to keep your urine clear or pale yellow. Avoid milk, caffeine, and alcohol.  · Follow specific rehydration instructions as told by your health care provider.  · Eat small, frequent meals instead of large meals.  Medicines  · Take your antibiotic medicine as told by your health care provider. Do not stop taking the antibiotic even if you start to feel better unless your health care provider told you to do that.  · Take over-the-counter and prescription medicines only as told by your health care provider.  · Do not use medicines to help with diarrhea.  General instructions  · Wash your hands thoroughly before you prepare food and after you use the bathroom. Make sure people who live with you also wash their hands often.  · Clean surfaces that you touch with a product that contains chlorine bleach.  · Keep all follow-up visits as told by your health care provider. This is important.  Contact a health care provider if:  ·   Your symptoms do not get better with treatment.  · Your symptoms get worse with treatment.  · Your symptoms go away and then return.  · You have a fever.  · You have new symptoms.  Get help right away if:  · You have increasing pain or tenderness in your abdomen.  · You have stool that is mostly bloody, or your stool looks dark black and tarry.  · You cannot eat or drink without vomiting.  · You have signs of dehydration, such as:  ? Dark urine, very little urine, or no urine.  ? Cracked lips.  ? Not making tears when you cry.  ? Dry mouth.  ? Sunken eyes.  ? Sleepiness.  ? Weakness.  ? Dizziness.  This information is not intended to replace advice given to you by your health care provider. Make sure you discuss any  questions you have with your health care provider.  Document Released: 02/22/2005 Document Revised: 10/21/2015 Document Reviewed: 11/16/2014  Elsevier Interactive Patient Education © 2017 [employer] Inc[employer]

## 2017-08-01 ENCOUNTER — Encounter: Payer: Self-pay | Admitting: Podiatry

## 2017-08-01 ENCOUNTER — Ambulatory Visit: Payer: Medicare Other | Admitting: Podiatry

## 2017-08-01 ENCOUNTER — Other Ambulatory Visit: Payer: Self-pay | Admitting: General Surgery

## 2017-08-01 DIAGNOSIS — E1140 Type 2 diabetes mellitus with diabetic neuropathy, unspecified: Secondary | ICD-10-CM | POA: Diagnosis not present

## 2017-08-01 DIAGNOSIS — B351 Tinea unguium: Secondary | ICD-10-CM | POA: Diagnosis not present

## 2017-08-01 DIAGNOSIS — M2011 Hallux valgus (acquired), right foot: Secondary | ICD-10-CM

## 2017-08-01 DIAGNOSIS — M79676 Pain in unspecified toe(s): Secondary | ICD-10-CM | POA: Diagnosis not present

## 2017-08-01 DIAGNOSIS — Z853 Personal history of malignant neoplasm of breast: Secondary | ICD-10-CM

## 2017-08-01 DIAGNOSIS — M2012 Hallux valgus (acquired), left foot: Secondary | ICD-10-CM

## 2017-08-01 NOTE — Progress Notes (Signed)
Patient ID: Diane [name], female   DOB: Oct 25, 1940, 77 y.o.   MRN: [id number]   Complaint:  Visit Type: Patient returns to my office for continued preventative foot care services. Complaint: Patient states" my nails have grown long and thick and become painful to walk and wear shoes" Patient has been diagnosed with DM with neuropathy.. The patient presents for preventative foot care services. No changes to ROS.  Patient says she missed an appointment since she was in the hospital.  Podiatric Exam: Vascular: dorsalis pedis and posterior tibial pulses are palpable bilateral. Capillary return is immediate. Temperature gradient is WNL. Skin turgor WNL  Sensorium: Diminished  Semmes Weinstein monofilament test. Normal tactile sensation bilaterally. Nail Exam: Pt has thick disfigured discolored nails with subungual debris noted bilateral entire nail hallux through fifth toenails Ulcer Exam: There is no evidence of ulcer or pre-ulcerative changes or infection. Orthopedic Exam: Muscle tone and strength are WNL. No limitations in general ROM. No crepitus or effusions noted. Foot type and digits show no abnormalities.HAV 1st MPJ B/L. Skin: No Porokeratosis. No infection or ulcers  Diagnosis:  Onychomycosis, , Pain in right toe, pain in left toes  HAV  B/L.  Treatment & Plan Procedures and Treatment: Consent by patient was obtained for treatment procedures. The patient understood the discussion of treatment and procedures well. All questions were answered thoroughly reviewed. Debridement of mycotic and hypertrophic toenails, 1 through 5 bilateral and clearing of subungual debris. No ulceration, no infection noted.  .   Return Visit-Office Procedure: Patient instructed to return to the office for a follow up visit 3 months for continued evaluation and treatment.   Gardiner Barefoot DPM

## 2017-09-04 ENCOUNTER — Other Ambulatory Visit: Payer: Self-pay

## 2017-09-04 ENCOUNTER — Ambulatory Visit (INDEPENDENT_AMBULATORY_CARE_PROVIDER_SITE_OTHER): Payer: Medicare Other

## 2017-09-04 ENCOUNTER — Encounter (HOSPITAL_COMMUNITY): Payer: Self-pay | Admitting: Emergency Medicine

## 2017-09-04 ENCOUNTER — Ambulatory Visit (HOSPITAL_COMMUNITY)
Admission: EM | Admit: 2017-09-04 | Discharge: 2017-09-04 | Disposition: A | Discharge status: Home / Self Care | Payer: Medicare Other | Attending: Family Medicine | Admitting: Family Medicine

## 2017-09-04 DIAGNOSIS — I509 Heart failure, unspecified: Secondary | ICD-10-CM

## 2017-09-04 DIAGNOSIS — R0602 Shortness of breath: Secondary | ICD-10-CM | POA: Diagnosis not present

## 2017-09-04 MED ORDER — FUROSEMIDE 40 MG PO TABS: 40.0000 mg | ORAL_TABLET | Freq: Every day | ORAL | 0 refills | Status: DC

## 2017-09-04 NOTE — ED Triage Notes (Signed)
Pt reports issues with SOB since she was hospitalized twice this year for PNA.  Pt states she has been feeling poorly with SOB with exertion since this weekend.  She has an inhaler but it does not seem to be helping her.

## 2017-09-04 NOTE — Discharge Instructions (Signed)
Please return here or to the Emergency Department immediately should you feel worse in any way or have any of the following symptoms: chest pain, pain that spreads to your arm, neck, jaw, back or abdomen, shortness of breath at rest, or nausea and vomiting. Please follow up with your doctor or here for a recheck in 24-48 hours in order to ensure that you are not developing a problem that might require hospitalization.

## 2017-09-05 ENCOUNTER — Ambulatory Visit
Admission: RE | Admit: 2017-09-05 | Discharge: 2017-09-05 | Disposition: A | Discharge status: Home / Self Care | Payer: Medicare Other | Source: Ambulatory Visit | Attending: General Surgery | Admitting: General Surgery

## 2017-09-05 DIAGNOSIS — Z853 Personal history of malignant neoplasm of breast: Secondary | ICD-10-CM

## 2017-09-05 NOTE — ED Provider Notes (Signed)
[place]   [id number] 09/04/17 Arrival Time: 3825  ASSESSMENT & PLAN:  1. SOB (shortness of breath)   2. Congestive heart failure, unspecified HF chronicity, unspecified heart failure type (HCC)    Elects 24 hour trial of Lasix vs ED evaluation this afternoon. I think this is reasonable.  Meds ordered this encounter  Medications  . furosemide (LASIX) 40 MG tablet    Sig: Take 1 tablet (40 mg total) by mouth daily.    Dispense:  5 tablet    Refill:  0   Worsening signs and symptoms discussed and patient verbalized understanding. May f/u here in the morning for recheck if needed.  Reviewed expectations re: course of current medical issues. Questions answered. Outlined signs and symptoms indicating need for more acute intervention. Patient verbalized understanding. After Visit Summary given.   SUBJECTIVE:  Diane [name] is a 77 y.o. female who presents with complaint of trouble breathing. Gradual onset over the past 2-3 days. Similar episodes in the past eventually requiring hospital admission. "Don't feel like it's that bad yet." No CP/n/v. SOB is absent at rest but starts with very little exertion. No LE edema over baseline. Afebrile. No recent illnesses. No new medications. Taking her prescribed medications as directed. OTC treatment: none.  ROS: As per HPI.   OBJECTIVE:  Vitals:   09/04/17 1246  BP: (!) 140/59  Pulse: 67  Temp: 97.8 F (36.6 C)  TempSrc: Oral  SpO2: 96%    General appearance: alert; no distress Eyes: PERRLA; EOMI; conjunctiva normal HENT: normocephalic; atraumatic Neck: supple Lungs: unlabored respirations at rest; slight rales at bases; does get quite SOB with walking Heart: regular rate and rhythm Chest Wall: non-tender Abdomen: soft, non-tender; bowel sounds normal; no masses or organomegaly; no guarding or rebound tenderness Extremities: mild edema bilaterally (L>R), reports this is her baseline; symmetrical with no gross  deformities Skin: warm and dry Psychological: alert and cooperative; normal mood and affect  ECG: Orders placed or performed during the hospital encounter of 09/04/17  . ED EKG  . ED EKG   Imaging: Dg Chest 2 View  Result Date: 09/04/2017 CLINICAL DATA:  Shortness of breath and sinus congestion for the past 2 months. Previous episodes of pneumonia, CHF. Former smoker. EXAM: CHEST - 2 VIEW COMPARISON:  Portable chest x-ray of July 06, 2017. FINDINGS: The lungs are well-expanded. There is patchy increased density at the right lung base. There is a small amount of fluid in the minor fissure. The cardiac silhouette is enlarged and the pulmonary vascularity engorged. The interstitial markings are increased diffusely. There is calcification in the wall of the aortic arch. There is no large pleural effusion. The bony thorax is unremarkable. IMPRESSION: CHF with mild pulmonary interstitial edema. Probable trace pleural effusion on the right. No alveolar pneumonia. The appearance of the chest has deteriorated since the previous study. Thoracic aortic atherosclerosis. Electronically Signed   By: David  Martinique M.D.   On: 09/04/2017 13:21    Allergies  Allergen Reactions  . Shellfish Allergy Anaphylaxis    Past Medical History:  Diagnosis Date  . Anxiety   . Arthritis    back  . Atrial fibrillation ([place])   . Breast cancer ([place])    right breast cancer - 3 years ago  . Coronary artery disease   . Depression   . Diabetes mellitus without complication (HCC)    diet  . Dysrhythmia    afib  . Fibromyalgia   . Herniated disc   .  Hyperlipemia   . Hypertension   . Iron deficiency anemia   . Lower back pain   . Lumbar stenosis    L4-5  . S/P radiation therapy 01/23/2013-03/06/2013   Right breast / 46 Gy in 23 fractions / Right breast boost / 14 Gy in 7 fractions  . Spondylolysis   . 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 Status post chemotherapy 11/04/2012 -  01/06/2013.   Docetaxel/Cytoxan Q 3 Weeks x 4 cycles  . Torn rotator cuff    Social History   Socioeconomic History  . Marital status: Widowed    Spouse name: Not on file  . Number of children: Not on file  . Years of education: Not on file  . Highest education level: Not on file  Occupational History  . Not on file  Social Needs  . Financial resource strain: Not on file  . Food insecurity:    Worry: Not on file    Inability: Not on file  . Transportation needs:    Medical: Not on file    Non-medical: Not on file  Tobacco Use  . Smoking status: Former Smoker    Years: 50.00    Types: Cigarettes    Last attempt to quit: 05/29/2009    Years since quitting: 8.2  . Smokeless tobacco: Never Used  Substance and Sexual Activity  . Alcohol use: Yes    Comment: rare  . Drug use: No  . Sexual activity: Never  Lifestyle  . Physical activity:    Days per week: Not on file    Minutes per session: Not on file  . Stress: Not on file  Relationships  . Social connections:    Talks on phone: Not on file    Gets together: Not on file    Attends religious service: Not on file    Active member of club or organization: Not on file    Attends meetings of clubs or organizations: Not on file    Relationship status: Not on file  . Intimate partner violence:    Fear of current or ex partner: Not on file    Emotionally abused: Not on file    Physically abused: Not on file    Forced sexual activity: Not on file  Other Topics Concern  . Not on file  Social History Narrative   Tobacco use cigarettes: former smoker, quit in year Sept 2011, Pack-year Hx: 87, tobacco history last updated 10/10/2013. No smoking. Per patient stopped smoking 9 months ago. No alcohol. Caffeine.: yes, 2+ servings daily, tea. No recreational drug use. Exercise: [employer] water aer[employer]. Marital status: widow.   Family History  Problem Relation Age of Onset  . Stroke Mother   . Alzheimer's disease Mother   . Heart  attack Father   . Heart attack Brother    Past Surgical History:  Procedure Laterality Date  . ABDOMINAL HYSTERECTOMY    . BREAST BIOPSY Right 09/04/2012  . BREAST LUMPECTOMY Right 10/08/2012  . COLONOSCOPY    . DILATION AND CURETTAGE OF UTERUS    . LEFT HEART CATHETERIZATION WITH CORONARY ANGIOGRAM N/A 09/14/2014   Procedure: LEFT HEART CATHETERIZATION WITH CORONARY ANGIOGRAM;  Surgeon: [name], MD;  Location[facility] C[facility] LAB;  Service: Cardiovascular;  Laterality: N/A;  . OPEN REDUCTION INTERNAL FIXATION (ORIF) PROXIMAL PHALANX Right 01/04/2016   Procedure: OPEN TREATMENT OF RIGHT THUMB PROXIMAL PHALANX FRACTURE;  Surgeon: Milly Jakob, MD;  Location: [facility];  Service: Orthopedics;  Laterality: Right;  . PARTIAL MASTECTOMY WITH NEEDLE LOCALIZATION AND AXILLARY SENTINEL LYMPH NODE BX Right 10/08/2012   Procedure: RIGHT PARTIAL MASTECTOMY WITH NEEDLE LOCALIZATION AND RIGHT AXILLARY SENTINEL LYMPH NODE BX;  Surgeon: Adin Hector, MD;  Location: [facility];  Service: General;  Laterality: Right;  Injection of 1% Methylene Blue dye into right breast  . PORTACATH PLACEMENT Left 10/08/2012   Procedure: INSERTION PORT-A-CATH WITH ULTRASOUND GUIDANCE;  Surgeon: Adin Hector, MD;  Location: [facility];  Service: General;  Laterality: Left;  Using 50F [place]  . Posterior Lumbar Arthrodesis, Pedicle screw fixation, Posterolateral Arthodesis  03/17/11  . SHOULDER ARTHROSCOPY WITH ROTATOR CUFF REPAIR AND SUBACROMIAL DECOMPRESSION  06/04/2012   Procedure: SHOULDER ARTHROSCOPY WITH ROTATOR CUFF REPAIR AND SUBACROMIAL DECOMPRESSION;  Surgeon: Nita Sells, MD;  Location: [facility];  Service: Orthopedics;  Laterality: Left;  LEFT SHOULDER ARTHROSCOPY WITH ROTATOR CUFF REPAIR AND SUBACROMIAL DECOMPRESSION AND DISTAL CLAVICLE RESECTION  . TONSILLECTOMY    . TUBAL LIGATION    . VULVECTOMY PARTIAL    . Wide Local Excision  01/2010   Bilateral groin excisions,  re-excision of vulva     Vanessa Kick, MD 09/05/17 [phone]

## 2017-10-26 ENCOUNTER — Ambulatory Visit
Admission: RE | Admit: 2017-10-26 | Discharge: 2017-10-26 | Disposition: A | Discharge status: Home / Self Care | Payer: Medicare Other | Source: Ambulatory Visit | Attending: Geriatric Medicine | Admitting: Geriatric Medicine

## 2017-10-26 ENCOUNTER — Other Ambulatory Visit: Payer: Self-pay | Admitting: Geriatric Medicine

## 2017-10-26 DIAGNOSIS — R0609 Other forms of dyspnea: Principal | ICD-10-CM

## 2017-10-26 DIAGNOSIS — R0600 Dyspnea, unspecified: Secondary | ICD-10-CM

## 2017-10-31 ENCOUNTER — Encounter: Payer: Self-pay | Admitting: Podiatry

## 2017-10-31 ENCOUNTER — Ambulatory Visit (INDEPENDENT_AMBULATORY_CARE_PROVIDER_SITE_OTHER): Payer: Medicare Other | Admitting: Podiatry

## 2017-10-31 DIAGNOSIS — E1140 Type 2 diabetes mellitus with diabetic neuropathy, unspecified: Secondary | ICD-10-CM | POA: Diagnosis not present

## 2017-10-31 DIAGNOSIS — B351 Tinea unguium: Secondary | ICD-10-CM | POA: Diagnosis not present

## 2017-10-31 DIAGNOSIS — M79676 Pain in unspecified toe(s): Secondary | ICD-10-CM

## 2017-10-31 NOTE — Progress Notes (Signed)
Patient ID: Diane [name], female   DOB: 01/22/1941, 76 y.o.   MRN: [id number]   Complaint:  Visit Type: Patient returns to my office for continued preventative foot care services. Complaint: Patient states" my nails have grown long and thick and become painful to walk and wear shoes" Patient has been diagnosed with DM with neuropathy.. The patient presents for preventative foot care services. No changes to ROS.  Patient says she missed an appointment since she was in the hospital in early 2019.  Podiatric Exam: Vascular: dorsalis pedis and posterior tibial pulses are palpable bilateral. Capillary return is immediate. Temperature gradient is WNL. Skin turgor WNL  Sensorium: Diminished  Semmes Weinstein monofilament test. Normal tactile sensation bilaterally. Nail Exam: Pt has thick disfigured discolored nails with subungual debris noted bilateral entire nail hallux through fifth toenails Ulcer Exam: There is no evidence of ulcer or pre-ulcerative changes or infection. Orthopedic Exam: Muscle tone and strength are WNL. No limitations in general ROM. No crepitus or effusions noted. Foot type and digits show no abnormalities.HAV 1st MPJ B/L. Skin: No Porokeratosis. No infection or ulcers  Diagnosis:  Onychomycosis, , Pain in right toe, pain in left toes  HAV  B/L.  Treatment & Plan Procedures and Treatment: Consent by patient was obtained for treatment procedures. The patient understood the discussion of treatment and procedures well. All questions were answered thoroughly reviewed. Debridement of mycotic and hypertrophic toenails, 1 through 5 bilateral and clearing of subungual debris. No ulceration, no infection noted.  .   Return Visit-Office Procedure: Patient instructed to return to the office for a follow up visit 3 months for continued evaluation and treatment.   Gregory Mayer DPM 

## 2017-11-27 ENCOUNTER — Other Ambulatory Visit: Payer: Self-pay | Admitting: Geriatric Medicine

## 2017-11-27 DIAGNOSIS — J984 Other disorders of lung: Secondary | ICD-10-CM

## 2017-12-06 ENCOUNTER — Ambulatory Visit
Admission: RE | Admit: 2017-12-06 | Discharge: 2017-12-06 | Disposition: A | Discharge status: Home / Self Care | Payer: Medicare Other | Source: Ambulatory Visit | Attending: Geriatric Medicine | Admitting: Geriatric Medicine

## 2017-12-06 DIAGNOSIS — J984 Other disorders of lung: Secondary | ICD-10-CM

## 2018-01-03 ENCOUNTER — Encounter: Payer: Self-pay | Admitting: Pulmonary Disease

## 2018-01-03 DIAGNOSIS — I5032 Chronic diastolic (congestive) heart failure: Secondary | ICD-10-CM | POA: Insufficient documentation

## 2018-01-03 HISTORY — DX: Chronic diastolic (congestive) heart failure: I50.32

## 2018-01-03 NOTE — Progress Notes (Signed)
Synopsis: Referred in August 2019 for shortness of breath, wheezing, question of restrictive lung disease by Lajean Manes, MD  Subjective:   PATIENT ID: Diane [name] GENDER: female DOB: [birth year], MRN: [id number]  Chief Complaint  Patient presents with  . pulmonary consult    referred by Dr. Felipa Eth for restricted lung disease. patient has SOB on exertion and at rest    The patient complains of SOB which started about a year ago. She has been seen by her cardiologist as well as her PCP. They have changed some of her medications. She had office spirometry completed on 11/26/2017: FVC 2.57, 55% predicted post bronchodilator, 15% change, FEV1 2.0 L, 55% predicted, 17% change, ratio 77.  The patient is a former smoker, 1 ppd, for 50 years, quit in 2012. PMH of breast cancer, Stage II, treated with chemo and radiation, lumpectomy to the right breast in 2014. Currently follows with Dr. Cathe Mons.   Her SOB is worse with exertion. It is associated with only moving around, no chest pain, or chest tights. She does occasionally wheeze. Occasionally has chills, denies cough or productive sputum. She was never diagnosed with COPD. Sitting up makes it bettter. She has to sleep on 3 pillows. Weather makes it worse, if its really hot and humid makes it worse. Heavy dust makes breathing worse and sometimes wheeze.   Originally from [facility], she was worked for [employer] of [place] and moved to [place], [place] and moved to [place] for a while and then moved back to spend time with great grand children. Currently has 4 great grandchildren.  Pets: None  Allergies: Every season has sinus issues Occupation: [employer], retired now [employer]: word puzzles, no dust or fumes  Travel: no international travel Sleep: 3 pillows, difficulty falling asleep   Past Medical History:  Diagnosis Date  . Anxiety   . Arthritis    back  . Atrial fibrillation ([place])   . Breast cancer ([place])    right breast cancer - 3  years ago  . Chronic diastolic heart failure ([place]) 01/03/2018   ECHO: 06/20/2017 - Grade 2 Diastolic Dysfunction   . Coronary artery disease   . Depression   . Diabetes mellitus without complication (HCC)    diet  . Dysrhythmia    afib  . Fibromyalgia   . Herniated disc   . Hyperlipemia   . Hypertension   . Iron deficiency anemia   . Lower back pain   . Lumbar stenosis    L4-5  . S/P radiation therapy 01/23/2013-03/06/2013   Right breast / 46 Gy in 23 fractions / Right breast boost / 14 Gy in 7 fractions  . Spondylolysis   . 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 Status post chemotherapy 11/04/2012 - 01/06/2013.   Docetaxel/Cytoxan Q 3 Weeks x 4 cycles  . Torn rotator cuff      Family History  Problem Relation Age of Onset  . Stroke Mother   . Alzheimer's disease Mother   . Heart attack Father   . Heart attack Brother   . Breast cancer Neg Hx      Social History   Socioeconomic History  . Marital status: Widowed    Spouse name: Not on file  . Number of children: Not on file  . Years of education: Not on file  . Highest education level: Not on file  Occupational History  . Not on file  Social  Needs  . Financial resource strain: Not on file  . Food insecurity:    Worry: Not on file    Inability: Not on file  . Transportation needs:    Medical: Not on file    Non-medical: Not on file  Tobacco Use  . Smoking status: Former Smoker    Years: 50.00    Types: Cigarettes    Last attempt to quit: 05/29/2009    Years since quitting: 8.6  . Smokeless tobacco: Never Used  Substance and Sexual Activity  . Alcohol use: Yes    Comment: rare  . Drug use: No  . Sexual activity: Never  Lifestyle  . Physical activity:    Days per week: Not on file    Minutes per session: Not on file  . Stress: Not on file  Relationships  . Social connections:    Talks on phone: Not on file    Gets together: Not on file    Attends religious service: Not on  file    Active member of club or organization: Not on file    Attends meetings of clubs or organizations: Not on file    Relationship status: Not on file  . Intimate partner violence:    Fear of current or ex partner: Not on file    Emotionally abused: Not on file    Physically abused: Not on file    Forced sexual activity: Not on file  Other Topics Concern  . Not on file  Social History Narrative   Tobacco use cigarettes: former smoker, quit in year Sept 2011, Pack-year Hx: 64, tobacco history last updated 10/10/2013. No smoking. Per patient stopped smoking 9 months ago. No alcohol. Caffeine.: yes, 2+ servings daily, tea. No recreational drug use. Exercise: [employer] water [employer]. Marital status: widow.     Allergies  Allergen Reactions  . Shellfish Allergy Anaphylaxis     Outpatient Medications Prior to Visit  Medication Sig Dispense Refill  . acetaminophen (TYLENOL) 500 MG tablet Take 1,000 mg by mouth every 6 (six) hours as needed for headache (pain).    [name] Kitchen albuterol (PROVENTIL HFA;VENTOLIN HFA) 108 (90 BASE) MCG/ACT inhaler Inhale 2 puffs into the lungs every 4 (four) hours as needed for wheezing or shortness of breath. 1 Inhaler 0  . aspirin 81 MG tablet Take 1 tablet (81 mg total) by mouth every 6 (six) hours as needed for mild pain. (Patient taking differently: Take 81 mg by mouth every 6 (six) hours as needed for pain. )    . dabigatran (PRADAXA) 75 MG CAPS Take 75 mg by mouth every 12 (twelve) hours.    [name] Kitchen FLUoxetine (PROZAC) 10 MG capsule Take 10 mg by mouth daily.    . fluticasone (FLONASE) 50 MCG/ACT nasal spray Place 1 spray into both nostrils daily. 16 g 0  . furosemide (LASIX) 40 MG tablet Take 1 tablet (40 mg total) by mouth daily. 5 tablet 0  . lactobacillus acidophilus (BACID) TABS tablet Take 2 tablets by mouth 3 (three) times daily. 180 tablet 0  . loratadine (CLARITIN) 10 MG tablet Take 1 tablet (10 mg total) by mouth daily.    [name] Kitchen losartan (COZAAR) 100 MG tablet Take 1  tablet (100 mg total) by mouth daily. 30 tablet 3  . metFORMIN (GLUCOPHAGE-XR) 500 MG 24 hr tablet Take 1 tablet (500 mg total) by mouth daily with breakfast. Total dose of 1000mg  daily (Patient taking differently: Take 500 mg by mouth daily. ) 30 tablet 3  . metoprolol  tartrate (LOPRESSOR) 25 MG tablet Take 0.5 tablets (12.5 mg total) by mouth at bedtime. 30 tablet 2  . NITROSTAT 0.4 MG SL tablet Place 0.4 mg under the tongue every 5 (five) minutes as needed for chest pain.     . potassium chloride (K-DUR) 10 MEQ tablet Take 2 tablets (20 mEq total) by mouth 2 (two) times daily. 120 tablet 2  . pravastatin (PRAVACHOL) 40 MG tablet Take 40 mg by mouth at bedtime.     . traZODone (DESYREL) 50 MG tablet Take 50 mg by mouth at bedtime.    . triamterene-hydrochlorothiazide (DYAZIDE) 37.5-25 MG per capsule Take 1 capsule by mouth daily.     . vancomycin (VANCOCIN) 125 MG capsule Take 1 capsule (125 mg total) by mouth 4 (four) times daily. 20 capsule 0   No facility-administered medications prior to visit.     Review of Systems  Constitutional: Positive for chills. Negative for fever, malaise/fatigue and weight loss.  HENT: Negative for hearing loss, sore throat and tinnitus.   Eyes: Negative for blurred vision and double vision.  Respiratory: Positive for shortness of breath and wheezing. Negative for cough, hemoptysis, sputum production and stridor.   Cardiovascular: Negative for chest pain, palpitations, orthopnea, leg swelling and PND.  Gastrointestinal: Negative for abdominal pain, constipation, diarrhea, heartburn, nausea and vomiting.  Genitourinary: Negative for dysuria, hematuria and urgency.  Musculoskeletal: Positive for joint pain. Negative for myalgias.  Skin: Negative for itching and rash.  Neurological: Negative for dizziness, tingling, weakness and headaches.  Endo/Heme/Allergies: Negative for environmental allergies. Does not bruise/bleed easily.  Psychiatric/Behavioral: Negative  for depression. The patient is not nervous/anxious and does not have insomnia.   All other systems reviewed and are negative.   Objective:  Physical Exam  Constitutional: She is oriented to person, place, and time. She appears well-developed and well-nourished. No distress.  HENT:  Head: Normocephalic and atraumatic.  Mouth/Throat: Oropharynx is clear and moist.  Eyes: Pupils are equal, round, and reactive to light. Conjunctivae are normal. No scleral icterus.  Neck: Neck supple. No JVD present. No tracheal deviation present.  Cardiovascular: Normal rate and intact distal pulses. An irregularly irregular rhythm present.  Murmur heard.  Systolic murmur is present with a grade of 3/6. Pulmonary/Chest: Effort normal. No accessory muscle usage or stridor. No tachypnea. No respiratory distress. She has no wheezes. She has no rhonchi. She has no rales.  Diminished breath sounds on the right  Abdominal: Soft. Bowel sounds are normal. She exhibits no distension. There is no tenderness.  Musculoskeletal: She exhibits no edema or tenderness.  Lymphadenopathy:    She has no cervical adenopathy.  Neurological: She is alert and oriented to person, place, and time.  Skin: Skin is warm and dry. Capillary refill takes less than 2 seconds. No rash noted.  Psychiatric: She has a normal mood and affect. Her behavior is normal.  Vitals reviewed.   Vitals:   01/04/18 1430  BP: 118/70  Pulse: 73  SpO2: 95%  Weight: 191 lb 12.8 oz (87 kg)  Height: 5' 7.5" (1.715 m)   95% on RA BMI Readings from Last 3 Encounters:  01/04/18 29.60 kg/m  07/07/17 31.94 kg/m  06/24/17 31.32 kg/m   Wt Readings from Last 3 Encounters:  01/04/18 191 lb 12.8 oz (87 kg)  07/07/17 203 lb 14.8 oz (92.5 kg)  06/24/17 200 lb (90.7 kg)     CBC    Component Value Date/Time   WBC 6.2 07/07/2017 0238   RBC 3.50 (  L) 07/07/2017 0238   HGB 9.1 (L) 07/07/2017 0238   HGB 10.6 (L) 01/11/2015 1054   HCT 28.0 (L)  07/07/2017 0238   HCT 33.1 (L) 01/11/2015 1054   PLT 190 07/07/2017 0238   PLT 207 01/11/2015 1054   MCV 80.0 07/07/2017 0238   MCV 84.1 01/11/2015 1054   MCH 26.0 07/07/2017 0238   MCHC 32.5 07/07/2017 0238   RDW 17.0 (H) 07/07/2017 0238   RDW 16.0 (H) 01/11/2015 1054   LYMPHSABS 1.4 07/02/2017 1729   LYMPHSABS 1.4 01/11/2015 1054   MONOABS 0.9 07/02/2017 1729   MONOABS 0.5 01/11/2015 1054   EOSABS 0.0 07/02/2017 1729   EOSABS 0.1 01/11/2015 1054   BASOSABS 0.0 07/02/2017 1729   BASOSABS 0.1 01/11/2015 1054    Chest Imaging: 12/06/2017 - HRCT imaging was personally reviewed.  Areas of mosaicism consistent with small airway disease Centrilobular emphysema   The patient's images have been independently reviewed by me.   Pulmonary Functions Testing Results: No results found for: FEV1, FVC, FEV1FVC, TLC, DLCO  FeNO: None   Pathology: None   Echocardiogram:  06/20/2017 Study Conclusions  - Left ventricle: The cavity size was normal. Systolic function was   normal. The estimated ejection fraction was in the range of 55%   to 60%. Wall motion was normal; there were no regional wall   motion abnormalities. Features are consistent with a pseudonormal   left ventricular filling pattern, with concomitant abnormal   relaxation and increased filling pressure (grade 2 diastolic   dysfunction). - Aortic valve: There was mild regurgitation. - Mitral valve: There was moderate regurgitation directed   posteriorly. - Left atrium: The atrium was mildly dilated. - Atrial septum: No defect or patent foramen ovale was identified. - Pericardium, extracardiac: A trivial pericardial effusion was   identified. Features were not consistent with tamponade   physiology.  Heart Catheterization: None     Assessment & Plan:   Ground glass opacity present on imaging of lung - Plan: budesonide-formoterol (SYMBICORT) 160-4.5 MCG/ACT inhaler, CBC w/Diff, Resp Allergy Profile Regn2DC DE MD Fellows  VA, IgE, Spacer/Aero-Holding Chambers (AEROCHAMBER MINI CHAMBER) DEVI  Chronic diastolic heart failure (HCC)  Abnormal lung function test - Plan: budesonide-formoterol (SYMBICORT) 160-4.5 MCG/ACT inhaler, CBC w/Diff, Resp Allergy Profile Regn2DC DE MD Jay VA, IgE, Spacer/Aero-Holding Chambers (AEROCHAMBER MINI CHAMBER) DEVI, Pulmonary Function Test  Pleural effusion on right  History of radiation exposure  History of breast cancer  Former smoker  Seasonal allergies  Seasonal allergic rhinitis, unspecified trigger  Discussion:  This is a 77 year old female with a history of seasonal allergic rhinitis and allergic conjunctivitis.  She is also a former smoker with 50-pack-year history. She was referred for evaluation of possible restrictive lung disease due to office spirometry with reduced FEV1 and FVC.  No lung volumes were obtained.  There was significant bronchodilator response.  High-resolution CT imaging was completed which revealed bilateral mosaicism consistent with small airway disease.  The bilateral GGO seen on CT imaging could represent mild edema.  This is consistent with possible asthma/COPD or underlying small airway disease such as bronchiolitis. She does have a history of chemo exposure as well as radiation to the chest from her history of breast cancer.  For complete evaluation we will obtain FULL PFTS, to include bronchodilator response, spirometry as well as lung volumes and DLCO.  We will check an in office FeNO today We will check blood work to include CBC with differential, RAST plus IgE We will start Symbicort  160 2 puffs twice daily with spacer Can continue albuterol as needed for shortness of breath and wheezing Will need to reevaluate right-sided pleural effusion to ensure resolution Patient to follow-up with cardiology regarding fluid pill and heart failure management Return to clinic in 4 weeks   Current Outpatient Medications:  .  acetaminophen (TYLENOL)  500 MG tablet, Take 1,000 mg by mouth every 6 (six) hours as needed for headache (pain)., Disp: , Rfl:  .  albuterol (PROVENTIL HFA;VENTOLIN HFA) 108 (90 BASE) MCG/ACT inhaler, Inhale 2 puffs into the lungs every 4 (four) hours as needed for wheezing or shortness of breath., Disp: 1 Inhaler, Rfl: 0 .  aspirin 81 MG tablet, Take 1 tablet (81 mg total) by mouth every 6 (six) hours as needed for mild pain. (Patient taking differently: Take 81 mg by mouth every 6 (six) hours as needed for pain. ), Disp: , Rfl:  .  dabigatran (PRADAXA) 75 MG CAPS, Take 75 mg by mouth every 12 (twelve) hours., Disp: , Rfl:  .  FLUoxetine (PROZAC) 10 MG capsule, Take 10 mg by mouth daily., Disp: , Rfl:  .  fluticasone (FLONASE) 50 MCG/ACT nasal spray, Place 1 spray into both nostrils daily., Disp: 16 g, Rfl: 0 .  furosemide (LASIX) 40 MG tablet, Take 1 tablet (40 mg total) by mouth daily., Disp: 5 tablet, Rfl: 0 .  lactobacillus acidophilus (BACID) TABS tablet, Take 2 tablets by mouth 3 (three) times daily., Disp: 180 tablet, Rfl: 0 .  loratadine (CLARITIN) 10 MG tablet, Take 1 tablet (10 mg total) by mouth daily., Disp: , Rfl:  .  losartan (COZAAR) 100 MG tablet, Take 1 tablet (100 mg total) by mouth daily., Disp: 30 tablet, Rfl: 3 .  metFORMIN (GLUCOPHAGE-XR) 500 MG 24 hr tablet, Take 1 tablet (500 mg total) by mouth daily with breakfast. Total dose of 1000mg  daily (Patient taking differently: Take 500 mg by mouth daily. ), Disp: 30 tablet, Rfl: 3 .  metoprolol tartrate (LOPRESSOR) 25 MG tablet, Take 0.5 tablets (12.5 mg total) by mouth at bedtime., Disp: 30 tablet, Rfl: 2 .  NITROSTAT 0.4 MG SL tablet, Place 0.4 mg under the tongue every 5 (five) minutes as needed for chest pain. , Disp: , Rfl:  .  potassium chloride (K-DUR) 10 MEQ tablet, Take 2 tablets (20 mEq total) by mouth 2 (two) times daily., Disp: 120 tablet, Rfl: 2 .  pravastatin (PRAVACHOL) 40 MG tablet, Take 40 mg by mouth at bedtime. , Disp: , Rfl:  .   traZODone (DESYREL) 50 MG tablet, Take 50 mg by mouth at bedtime., Disp: , Rfl:  .  triamterene-hydrochlorothiazide (DYAZIDE) 37.5-25 MG per capsule, Take 1 capsule by mouth daily. , Disp: , Rfl:  .  vancomycin (VANCOCIN) 125 MG capsule, Take 1 capsule (125 mg total) by mouth 4 (four) times daily., Disp: 20 capsule, Rfl: 0 .  budesonide-formoterol (SYMBICORT) 160-4.5 MCG/ACT inhaler, Inhale 2 puffs into the lungs 2 (two) times daily., Disp: 1 Inhaler, Rfl: 6 .  Spacer/Aero-Holding Chambers (AEROCHAMBER MINI CHAMBER) DEVI, 1 Device by Does not apply route as directed., Disp: 1 Device, Rfl: 0   Garner Nash, DO [facility] 01/04/2018 3:28 PM

## 2018-01-04 ENCOUNTER — Ambulatory Visit: Payer: Medicare Other | Admitting: Pulmonary Disease

## 2018-01-04 ENCOUNTER — Other Ambulatory Visit (INDEPENDENT_AMBULATORY_CARE_PROVIDER_SITE_OTHER): Payer: Medicare Other

## 2018-01-04 ENCOUNTER — Encounter: Payer: Self-pay | Admitting: Pulmonary Disease

## 2018-01-04 VITALS — BP 118/70 | HR 73 | Ht 67.5 in | Wt 191.8 lb

## 2018-01-04 DIAGNOSIS — R918 Other nonspecific abnormal finding of lung field: Secondary | ICD-10-CM

## 2018-01-04 DIAGNOSIS — J90 Pleural effusion, not elsewhere classified: Secondary | ICD-10-CM | POA: Diagnosis not present

## 2018-01-04 DIAGNOSIS — R942 Abnormal results of pulmonary function studies: Secondary | ICD-10-CM

## 2018-01-04 DIAGNOSIS — Z87891 Personal history of nicotine dependence: Secondary | ICD-10-CM

## 2018-01-04 DIAGNOSIS — I5032 Chronic diastolic (congestive) heart failure: Secondary | ICD-10-CM | POA: Diagnosis not present

## 2018-01-04 DIAGNOSIS — Z923 Personal history of irradiation: Secondary | ICD-10-CM

## 2018-01-04 DIAGNOSIS — J302 Other seasonal allergic rhinitis: Secondary | ICD-10-CM

## 2018-01-04 DIAGNOSIS — Z853 Personal history of malignant neoplasm of breast: Secondary | ICD-10-CM

## 2018-01-04 LAB — CBC WITH DIFFERENTIAL/PLATELET
Basophils Absolute: 0.1 10*3/uL (ref 0.0–0.1)
Basophils Relative: 1.1 % (ref 0.0–3.0)
Eosinophils Absolute: 0.1 10*3/uL (ref 0.0–0.7)
Eosinophils Relative: 1.5 % (ref 0.0–5.0)
HCT: 34.1 % — ABNORMAL LOW (ref 36.0–46.0)
Hemoglobin: 11.1 g/dL — ABNORMAL LOW (ref 12.0–15.0)
Lymphocytes Relative: 28.2 % (ref 12.0–46.0)
Lymphs Abs: 1.6 10*3/uL (ref 0.7–4.0)
MCHC: 32.5 g/dL (ref 30.0–36.0)
MCV: 83.4 fl (ref 78.0–100.0)
Monocytes Absolute: 0.6 10*3/uL (ref 0.1–1.0)
Monocytes Relative: 10.9 % (ref 3.0–12.0)
Neutro Abs: 3.2 10*3/uL (ref 1.4–7.7)
Neutrophils Relative %: 58.3 % (ref 43.0–77.0)
Platelets: 192 10*3/uL (ref 150.0–400.0)
RBC: 4.09 Mil/uL (ref 3.87–5.11)
RDW: 18.5 % — ABNORMAL HIGH (ref 11.5–15.5)
WBC: 5.6 10*3/uL (ref 4.0–10.5)

## 2018-01-04 MED ORDER — BUDESONIDE-FORMOTEROL FUMARATE 160-4.5 MCG/ACT IN AERO: 2.0000 | INHALATION_SPRAY | Freq: Two times a day (BID) | RESPIRATORY_TRACT | 6 refills | Status: DC

## 2018-01-04 MED ORDER — AEROCHAMBER MINI CHAMBER DEVI: 1.0000 | 0 refills | Status: DC

## 2018-01-04 NOTE — Patient Instructions (Signed)
We will check blood work today to include CBC with differential, regional RAST plus IgE We will schedule you for full PFTs We will give sample of Symbicort 160 2 puffs twice daily with spacer Continue as needed albuterol inhaler for shortness of breath Recommend follow-up with cardiology regarding management of fluid pill.

## 2018-01-07 LAB — INTERPRETATION:

## 2018-01-07 LAB — RESPIRATORY ALLERGY PROFILE REGION II ~~LOC~~

## 2018-01-30 ENCOUNTER — Ambulatory Visit (INDEPENDENT_AMBULATORY_CARE_PROVIDER_SITE_OTHER): Payer: Medicare Other | Admitting: Podiatry

## 2018-01-30 ENCOUNTER — Encounter: Payer: Self-pay | Admitting: Podiatry

## 2018-01-30 DIAGNOSIS — E1140 Type 2 diabetes mellitus with diabetic neuropathy, unspecified: Secondary | ICD-10-CM

## 2018-01-30 DIAGNOSIS — M79676 Pain in unspecified toe(s): Secondary | ICD-10-CM

## 2018-01-30 DIAGNOSIS — M2011 Hallux valgus (acquired), right foot: Secondary | ICD-10-CM

## 2018-01-30 DIAGNOSIS — B351 Tinea unguium: Secondary | ICD-10-CM

## 2018-01-30 DIAGNOSIS — M2012 Hallux valgus (acquired), left foot: Secondary | ICD-10-CM

## 2018-01-30 NOTE — Progress Notes (Signed)
Patient ID: Diane [name], female   DOB: Jul 27, 1940, 77 y.o.   MRN: [id number]   Complaint:  Visit Type: Patient returns to my office for continued preventative foot care services. Complaint: Patient states" my nails have grown long and thick and become painful to walk and wear shoes" Patient has been diagnosed with DM with neuropathy.. The patient presents for preventative foot care services. No changes to ROS.  Patient says she missed an appointment since she was in the hospital in early 2019.  Podiatric Exam: Vascular: dorsalis pedis and posterior tibial pulses are palpable bilateral. Capillary return is immediate. Temperature gradient is WNL. Skin turgor WNL  Sensorium: Diminished  Semmes Weinstein monofilament test. Normal tactile sensation bilaterally. Nail Exam: Pt has thick disfigured discolored nails with subungual debris noted bilateral entire nail hallux through fifth toenails Ulcer Exam: There is no evidence of ulcer or pre-ulcerative changes or infection. Orthopedic Exam: Muscle tone and strength are WNL. No limitations in general ROM. No crepitus or effusions noted. Foot type and digits show no abnormalities.HAV 1st MPJ B/L. Skin: No Porokeratosis. No infection or ulcers  Diagnosis:  Onychomycosis, , Pain in right toe, pain in left toes  HAV  B/L.  Treatment & Plan Procedures and Treatment: Consent by patient was obtained for treatment procedures. The patient understood the discussion of treatment and procedures well. All questions were answered thoroughly reviewed. Debridement of mycotic and hypertrophic toenails, 1 through 5 bilateral and clearing of subungual debris. No ulceration, no infection noted.  .   Return Visit-Office Procedure: Patient instructed to return to the office for a follow up visit 3 months for continued evaluation and treatment.   Gardiner Barefoot DPM

## 2018-02-06 ENCOUNTER — Encounter: Payer: Self-pay | Admitting: Pulmonary Disease

## 2018-02-06 ENCOUNTER — Ambulatory Visit (INDEPENDENT_AMBULATORY_CARE_PROVIDER_SITE_OTHER): Payer: Medicare Other | Admitting: Pulmonary Disease

## 2018-02-06 ENCOUNTER — Other Ambulatory Visit (INDEPENDENT_AMBULATORY_CARE_PROVIDER_SITE_OTHER): Payer: Medicare Other

## 2018-02-06 ENCOUNTER — Ambulatory Visit (INDEPENDENT_AMBULATORY_CARE_PROVIDER_SITE_OTHER)
Admission: RE | Admit: 2018-02-06 | Discharge: 2018-02-06 | Disposition: A | Discharge status: Home / Self Care | Payer: Medicare Other | Source: Ambulatory Visit | Attending: Pulmonary Disease | Admitting: Pulmonary Disease

## 2018-02-06 VITALS — BP 122/80 | HR 73 | Ht 66.0 in | Wt 190.2 lb

## 2018-02-06 DIAGNOSIS — J302 Other seasonal allergic rhinitis: Secondary | ICD-10-CM

## 2018-02-06 DIAGNOSIS — R918 Other nonspecific abnormal finding of lung field: Secondary | ICD-10-CM | POA: Diagnosis not present

## 2018-02-06 DIAGNOSIS — I5032 Chronic diastolic (congestive) heart failure: Secondary | ICD-10-CM | POA: Diagnosis not present

## 2018-02-06 DIAGNOSIS — Z853 Personal history of malignant neoplasm of breast: Secondary | ICD-10-CM

## 2018-02-06 DIAGNOSIS — Z87891 Personal history of nicotine dependence: Secondary | ICD-10-CM

## 2018-02-06 DIAGNOSIS — Z923 Personal history of irradiation: Secondary | ICD-10-CM

## 2018-02-06 DIAGNOSIS — J90 Pleural effusion, not elsewhere classified: Secondary | ICD-10-CM

## 2018-02-06 DIAGNOSIS — R942 Abnormal results of pulmonary function studies: Secondary | ICD-10-CM

## 2018-02-06 LAB — PULMONARY FUNCTION TEST
DL/VA % pred: 77 %
DL/VA: 3.91 ml/min/mmHg/L
DLCO cor % pred: 44 %
DLCO cor: 11.96 ml/min/mmHg
DLCO unc % pred: 40 %
DLCO unc: 11.02 ml/min/mmHg
FEF 25-75 Post: 1.86 L/sec
FEF 25-75 Pre: 1.07 L/sec
FEF2575-%Change-Post: 74 %
FEF2575-%Pred-Post: 112 %
FEF2575-%Pred-Pre: 64 %
FEV1-%Change-Post: 10 %
FEV1-%Pred-Post: 61 %
FEV1-%Pred-Pre: 55 %
FEV1-Post: 1.15 L
FEV1-Pre: 1.05 L
FEV1FVC-%Change-Post: 5 %
FEV1FVC-%Pred-Pre: 109 %
FEV6-%Change-Post: 4 %
FEV6-%Pred-Post: 56 %
FEV6-%Pred-Pre: 53 %
FEV6-Post: 1.32 L
FEV6-Pre: 1.26 L
FEV6FVC-%Pred-Post: 104 %
FEV6FVC-%Pred-Pre: 104 %
FVC-%Change-Post: 4 %
FVC-%Pred-Post: 54 %
FVC-%Pred-Pre: 51 %
FVC-Post: 1.32 L
FVC-Pre: 1.26 L
Post FEV1/FVC ratio: 87 %
Post FEV6/FVC ratio: 100 %
Pre FEV1/FVC ratio: 83 %
Pre FEV6/FVC Ratio: 100 %
RV % pred: 95 %
RV: 2.28 L
TLC % pred: 76 %
TLC: 4.11 L

## 2018-02-06 LAB — BASIC METABOLIC PANEL
BUN: 20 mg/dL (ref 6–23)
CO2: 25 mEq/L (ref 19–32)
Calcium: 8.9 mg/dL (ref 8.4–10.5)
Chloride: 104 mEq/L (ref 96–112)
Creatinine, Ser: 1.65 mg/dL — ABNORMAL HIGH (ref 0.40–1.20)
GFR: 39.03 mL/min — ABNORMAL LOW (ref >60.00)
Glucose, Bld: 126 mg/dL — ABNORMAL HIGH (ref 70–99)
Potassium: 3.8 mEq/L (ref 3.5–5.1)
Sodium: 138 mEq/L (ref 135–145)

## 2018-02-06 MED ORDER — TIOTROPIUM BROMIDE MONOHYDRATE 1.25 MCG/ACT IN AERS: 2.0000 | INHALATION_SPRAY | Freq: Every day | RESPIRATORY_TRACT | 5 refills | Status: DC

## 2018-02-06 NOTE — Patient Instructions (Addendum)
Start spiriva 2.57mcg 2 puffs once daily Can you PRN albuterol Will check lab work to include proBNP and basic metabolic panel today.  Should follow-up with cardiology.  This is planned for next Wednesday.  Continue fluid pill and watch fluid management.  2 view chest x-ray for evaluation of right-sided pleural effusion. Will refer to cardiopulmonary rehab. RTC in 3 months

## 2018-02-06 NOTE — Progress Notes (Signed)
Synopsis: Referred in August 2019 for shortness of breath, wheezing, question of restrictive lung disease by Lajean Manes, MD  Subjective:   PATIENT ID: Diane [name] GENDER: female DOB: September 18, 1940, MRN: [id number]  Chief Complaint  Patient presents with  . Follow-up    Started on symbicort, does not feel that it helped her, feels that her SOB is getting worse.     01/04/2018:   The patient complains of SOB which started about a year ago. She has been seen by her cardiologist as well as her PCP. They have changed some of her medications. She had office spirometry completed on 11/26/2017: FVC 2.57, 55% predicted post bronchodilator, 15% change, FEV1 2.0 L, 55% predicted, 17% change, ratio 77.  The patient is a former smoker, 1 ppd, for 50 years, quit in 2012. PMH of breast cancer, Stage II, treated with chemo and radiation, lumpectomy to the right breast in 2014. Currently follows with Dr. Cathe Mons. She was never diagnosed with COPD. Sitting up makes it bettter. She has to sleep on 3 pillows. Weather makes it worse, if its really hot and humid makes it worse. Heavy dust makes breathing worse and sometimes wheeze.   Originally from [facility], she was worked for [employer] of [place] and moved to [place], [place] and moved to [place] for a while and then moved back to spend time with great grand children. Currently has 4 great grandchildren.  Pets: None  Allergies: Every season has sinus issues Occupation: [employer], retired now [employer]: word puzzles, no dust or fumes  Travel: no international travel Sleep: 3 pillows, difficulty falling asleep  OV 02/06/2018: Seen today in follow up for SOB. PFTs completed today. She has been using her albuterol inhaler.. She thinks this is helping a little.  During her last visit she was given samples of Symbicort.  This she does not think made much difference in her shortness of breath. She feels like she has to stop while walking if she is going long distances  or hurrying up hills.  She still having to sleep with 3 pillows.  She does have shortness of breath when she bends over.  She walks with a rolling walker at baseline.  Patient denies cough, sputum production, hemoptysis, chest pain, palpitations.   Past Medical History:  Diagnosis Date  . Anxiety   . Arthritis    back  . Atrial fibrillation ([place])   . Breast cancer ([place])    right breast cancer - 3 years ago  . Chronic diastolic heart failure ([place]) 01/03/2018   ECHO: 06/20/2017 - Grade 2 Diastolic Dysfunction   . Coronary artery disease   . Depression   . Diabetes mellitus without complication (HCC)    diet  . Dysrhythmia    afib  . Fibromyalgia   . Herniated disc   . Hyperlipemia   . Hypertension   . Iron deficiency anemia   . Lower back pain   . Lumbar stenosis    L4-5  . S/P radiation therapy 01/23/2013-03/06/2013   Right breast / 46 Gy in 23 fractions / Right breast boost / 14 Gy in 7 fractions  . Spondylolysis   . 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 Status post chemotherapy 11/04/2012 - 01/06/2013.   Docetaxel/Cytoxan Q 3 Weeks x 4 cycles  . Torn rotator cuff      Family History  Problem Relation Age of Onset  . Stroke Mother   .  Alzheimer's disease Mother   . Heart attack Father   . Heart attack Brother   . Breast cancer Neg Hx      Social History   Socioeconomic History  . Marital status: Widowed    Spouse name: Not on file  . Number of children: Not on file  . Years of education: Not on file  . Highest education level: Not on file  Occupational History  . Not on file  Social Needs  . Financial resource strain: Not on file  . Food insecurity:    Worry: Not on file    Inability: Not on file  . Transportation needs:    Medical: Not on file    Non-medical: Not on file  Tobacco Use  . Smoking status: Former Smoker    Years: 50.00    Types: Cigarettes    Last attempt to quit: 05/29/2009    Years since quitting: 8.6  .  Smokeless tobacco: Never Used  Substance and Sexual Activity  . Alcohol use: Yes    Comment: rare  . Drug use: No  . Sexual activity: Never  Lifestyle  . Physical activity:    Days per week: Not on file    Minutes per session: Not on file  . Stress: Not on file  Relationships  . Social connections:    Talks on phone: Not on file    Gets together: Not on file    Attends religious service: Not on file    Active member of club or organization: Not on file    Attends meetings of clubs or organizations: Not on file    Relationship status: Not on file  . Intimate partner violence:    Fear of current or ex partner: Not on file    Emotionally abused: Not on file    Physically abused: Not on file    Forced sexual activity: Not on file  Other Topics Concern  . Not on file  Social History Narrative   Tobacco use cigarettes: former smoker, quit in year Sept 2011, Pack-year Hx: 54, tobacco history last updated 10/10/2013. No smoking. Per patient stopped smoking 9 months ago. No alcohol. Caffeine.: yes, 2+ servings daily, tea. No recreational drug use. Exercise: [employer] water [employer]. Marital status: widow.     Allergies  Allergen Reactions  . Shellfish Allergy Anaphylaxis     Outpatient Medications Prior to Visit  Medication Sig Dispense Refill  . acetaminophen (TYLENOL) 500 MG tablet Take 1,000 mg by mouth every 6 (six) hours as needed for headache (pain).    [name] Kitchen albuterol (PROVENTIL HFA;VENTOLIN HFA) 108 (90 BASE) MCG/ACT inhaler Inhale 2 puffs into the lungs every 4 (four) hours as needed for wheezing or shortness of breath. 1 Inhaler 0  . atorvastatin (LIPITOR) 20 MG tablet TAKE 1 TABLET BY MOUTH ONCE DAILY FOR 30 DAYS  11  . budesonide-formoterol (SYMBICORT) 160-4.5 MCG/ACT inhaler Inhale 2 puffs into the lungs 2 (two) times daily. 1 Inhaler 6  . dabigatran (PRADAXA) 75 MG CAPS Take 75 mg by mouth every 12 (twelve) hours.    [name] Kitchen FLUoxetine (PROZAC) 10 MG capsule Take 10 mg by mouth daily.     . fluticasone (FLONASE) 50 MCG/ACT nasal spray Place 1 spray into both nostrils daily. 16 g 0  . furosemide (LASIX) 40 MG tablet Take 1 tablet (40 mg total) by mouth daily. 5 tablet 0  . lactobacillus acidophilus (BACID) TABS tablet Take 2 tablets by mouth 3 (three) times daily. 180 tablet 0  .  loratadine (CLARITIN) 10 MG tablet Take 1 tablet (10 mg total) by mouth daily.    [name] Kitchen losartan (COZAAR) 100 MG tablet Take 1 tablet (100 mg total) by mouth daily. 30 tablet 3  . metFORMIN (GLUCOPHAGE-XR) 500 MG 24 hr tablet Take 1 tablet (500 mg total) by mouth daily with breakfast. Total dose of 1000mg  daily (Patient taking differently: Take 500 mg by mouth daily. ) 30 tablet 3  . metoprolol tartrate (LOPRESSOR) 25 MG tablet Take 0.5 tablets (12.5 mg total) by mouth at bedtime. 30 tablet 2  . NITROSTAT 0.4 MG SL tablet Place 0.4 mg under the tongue every 5 (five) minutes as needed for chest pain.     Glory Rosebush VERIO test strip FOR USE WHEN CHECKING BLOOD GLUCOSE ONCE DAILY ALTERNATING AM AND PM BEFORE MEALS FINGER STICK 90 DAYS  3  . potassium chloride (K-DUR) 10 MEQ tablet Take 2 tablets (20 mEq total) by mouth 2 (two) times daily. 120 tablet 2  . pravastatin (PRAVACHOL) 40 MG tablet Take 40 mg by mouth at bedtime.     [name] Kitchen Spacer/Aero-Holding Chambers (AEROCHAMBER MINI CHAMBER) DEVI 1 Device by Does not apply route as directed. 1 Device 0  . traZODone (DESYREL) 50 MG tablet Take 50 mg by mouth at bedtime.    . triamterene-hydrochlorothiazide (DYAZIDE) 37.5-25 MG per capsule Take 1 capsule by mouth daily.     [name] Kitchen aspirin 81 MG tablet Take 1 tablet (81 mg total) by mouth every 6 (six) hours as needed for mild pain. (Patient not taking: Reported on 02/06/2018)    . vancomycin (VANCOCIN) 125 MG capsule Take 1 capsule (125 mg total) by mouth 4 (four) times daily. (Patient not taking: Reported on 02/06/2018) 20 capsule 0   No facility-administered medications prior to visit.     Review of Systems    Constitutional: Negative for chills, fever, malaise/fatigue and weight loss.  HENT: Negative for hearing loss, sore throat and tinnitus.   Eyes: Negative for blurred vision and double vision.  Respiratory: Positive for shortness of breath and wheezing. Negative for cough, hemoptysis, sputum production and stridor.   Cardiovascular: Negative for chest pain, palpitations, orthopnea, leg swelling and PND.  Gastrointestinal: Negative for abdominal pain, constipation, diarrhea, heartburn, nausea and vomiting.  Genitourinary: Negative for dysuria, hematuria and urgency.  Musculoskeletal: Positive for joint pain. Negative for myalgias.       Hip and leg pains.  Skin: Negative for itching and rash.  Neurological: Negative for dizziness, tingling, weakness and headaches.  Endo/Heme/Allergies: Negative for environmental allergies. Does not bruise/bleed easily.  Psychiatric/Behavioral: Negative for depression. The patient is not nervous/anxious and does not have insomnia.   All other systems reviewed and are negative.   Objective:  Physical Exam  Constitutional: She is oriented to person, place, and time. She appears well-developed and well-nourished. No distress.  HENT:  Head: Normocephalic and atraumatic.  Mouth/Throat: Oropharynx is clear and moist.  Eyes: Pupils are equal, round, and reactive to light. Conjunctivae are normal. No scleral icterus.  Neck: Neck supple. No JVD present. No tracheal deviation present.  Cardiovascular: Normal rate and intact distal pulses. An irregularly irregular rhythm present.  Murmur heard.  Systolic murmur is present with a grade of 3/6. Pulmonary/Chest: Effort normal. No accessory muscle usage or stridor. No tachypnea. No respiratory distress. She has no wheezes. She has no rhonchi. She has no rales.  Diminished breath sounds on the right  Abdominal: Soft. Bowel sounds are normal. She exhibits no distension. There is no  tenderness.  Musculoskeletal: She  exhibits no edema or tenderness.  Ambulates with rolling walker  Lymphadenopathy:    She has no cervical adenopathy.  Neurological: She is alert and oriented to person, place, and time.  Skin: Skin is warm and dry. Capillary refill takes less than 2 seconds. No rash noted.  Psychiatric: She has a normal mood and affect. Her behavior is normal.  Vitals reviewed.   Vitals:   02/06/18 1326  BP: 122/80  Pulse: 73  SpO2: 97%  Weight: 190 lb 3.2 oz (86.3 kg)  Height: 5\' 6"  (1.676 m)   97% on RA BMI Readings from Last 3 Encounters:  02/06/18 30.70 kg/m  01/04/18 29.60 kg/m  07/07/17 31.94 kg/m   Wt Readings from Last 3 Encounters:  02/06/18 190 lb 3.2 oz (86.3 kg)  01/04/18 191 lb 12.8 oz (87 kg)  07/07/17 203 lb 14.8 oz (92.5 kg)     CBC    Component Value Date/Time   WBC 5.6 01/04/2018 1536   RBC 4.09 01/04/2018 1536   HGB 11.1 (L) 01/04/2018 1536   HGB 10.6 (L) 01/11/2015 1054   HCT 34.1 (L) 01/04/2018 1536   HCT 33.1 (L) 01/11/2015 1054   PLT 192.0 01/04/2018 1536   PLT 207 01/11/2015 1054   MCV 83.4 01/04/2018 1536   MCV 84.1 01/11/2015 1054   MCH 26.0 07/07/2017 0238   MCHC 32.5 01/04/2018 1536   RDW 18.5 (H) 01/04/2018 1536   RDW 16.0 (H) 01/11/2015 1054   LYMPHSABS 1.6 01/04/2018 1536   LYMPHSABS 1.4 01/11/2015 1054   MONOABS 0.6 01/04/2018 1536   MONOABS 0.5 01/11/2015 1054   EOSABS 0.1 01/04/2018 1536   EOSABS 0.1 01/11/2015 1054   BASOSABS 0.1 01/04/2018 1536   BASOSABS 0.1 01/11/2015 1054   01/04/2018: RAST panel negative IgE: 41  Chest Imaging: 12/06/2017 -HRCT Areas of mosaicism consistent with small airway disease Centrilobular emphysema   The patient's images have been independently reviewed by me.     Pulmonary Functions Testing Results: 02/06/2018: FVC 1.32 L, 52% predicted postbronchodilator FEV1 1.15 L, 61% predicted postbronchodilator, no significant bronchodilator response TLC 76% predicted DLCO 40% predicted  FeNO: None    Pathology: None   Echocardiogram:  06/20/2017 Study Conclusions  - Left ventricle: The cavity size was normal. Systolic function was   normal. The estimated ejection fraction was in the range of 55%   to 60%. Wall motion was normal; there were no regional wall   motion abnormalities. Features are consistent with a pseudonormal   left ventricular filling pattern, with concomitant abnormal   relaxation and increased filling pressure (grade 2 diastolic   dysfunction). - Aortic valve: There was mild regurgitation. - Mitral valve: There was moderate regurgitation directed   posteriorly. - Left atrium: The atrium was mildly dilated. - Atrial septum: No defect or patent foramen ovale was identified. - Pericardium, extracardiac: A trivial pericardial effusion was   identified. Features were not consistent with tamponade   physiology.  Heart Catheterization: None     Assessment & Plan:   Ground glass opacity present on imaging of lung  Chronic diastolic heart failure (HCC)  Pleural effusion on right - Plan: Pro b natriuretic peptide, Basic metabolic panel, AMB referral to pulmonary rehabilitation, DG Chest 2 View  History of radiation exposure  History of breast cancer  Former smoker  Seasonal allergic rhinitis, unspecified trigger  Discussion:  This is a pleasant 77 year old female with a past medical history of seasonal allergies and allergic compromise.  She is a former smoker with 50-pack-year history of smoking.  PFTs revealed a mixed restrictive and obstructive defect.  Ratio was normal however both FVC and FEV1 are depressed.  Flow volume loop has some mild scalloping to suggest obstruction.  He does have a severely reduced DLCO at 40%.  The patient does not have a significant enough emphysema on her CT imaging to explain this reduction of DLCO.  Therefore would suspect additional disease process such as PH. Prior CT imaging with bilateral groundglass opacities and  mosaicism.  I feels that we could be dealing with a small airway disease such as a bronchiolitis however differential would also include pulmonary hypertension with a reduced DLCO and areas of mosaicism on CT.  I suspect the areas of groundglass that are seen on her prior CT imaging is more consistent with pulmonary edema of the associated right-sided pleural effusion.  She does have a history of chemo exposure as well as radiation to the chest from her history of breast cancer which may be contributing to these changes.   At this point the patient would prefer conservative management and avoid any invasive testing.  Start spiriva 2.41mcg 2 puffs once daily to see if this helps symptomatically.  She does have some evidence of emphysema on CT.  With negative RAST panel and no other clinical symptoms of asthma comfortable treating with LAMA Can continue PRN albuterol Will check lab work to include proBNP and basic metabolic panel today.   Should follow-up with cardiology.  This is planned for next Wednesday.   Continue fluid pill and watch fluid management.  2 view chest x-ray for evaluation of right-sided pleural effusion. Will refer to cardiopulmonary rehab.  We will send office note to cardiologist.  May consider repeating 2D limited echo for evaluation of right ventricular pressures and estimated PASP/pulmonary artery pressures.  She is following up with cardiology next week.  RTC in 3 months   Current Outpatient Medications:  .  acetaminophen (TYLENOL) 500 MG tablet, Take 1,000 mg by mouth every 6 (six) hours as needed for headache (pain)., Disp: , Rfl:  .  albuterol (PROVENTIL HFA;VENTOLIN HFA) 108 (90 BASE) MCG/ACT inhaler, Inhale 2 puffs into the lungs every 4 (four) hours as needed for wheezing or shortness of breath., Disp: 1 Inhaler, Rfl: 0 .  atorvastatin (LIPITOR) 20 MG tablet, TAKE 1 TABLET BY MOUTH ONCE DAILY FOR 30 DAYS, Disp: , Rfl: 11 .  budesonide-formoterol (SYMBICORT) 160-4.5  MCG/ACT inhaler, Inhale 2 puffs into the lungs 2 (two) times daily., Disp: 1 Inhaler, Rfl: 6 .  dabigatran (PRADAXA) 75 MG CAPS, Take 75 mg by mouth every 12 (twelve) hours., Disp: , Rfl:  .  FLUoxetine (PROZAC) 10 MG capsule, Take 10 mg by mouth daily., Disp: , Rfl:  .  fluticasone (FLONASE) 50 MCG/ACT nasal spray, Place 1 spray into both nostrils daily., Disp: 16 g, Rfl: 0 .  furosemide (LASIX) 40 MG tablet, Take 1 tablet (40 mg total) by mouth daily., Disp: 5 tablet, Rfl: 0 .  lactobacillus acidophilus (BACID) TABS tablet, Take 2 tablets by mouth 3 (three) times daily., Disp: 180 tablet, Rfl: 0 .  loratadine (CLARITIN) 10 MG tablet, Take 1 tablet (10 mg total) by mouth daily., Disp: , Rfl:  .  losartan (COZAAR) 100 MG tablet, Take 1 tablet (100 mg total) by mouth daily., Disp: 30 tablet, Rfl: 3 .  metFORMIN (GLUCOPHAGE-XR) 500 MG 24 hr tablet, Take 1 tablet (500 mg total) by mouth daily with breakfast.  Total dose of 1000mg  daily (Patient taking differently: Take 500 mg by mouth daily. ), Disp: 30 tablet, Rfl: 3 .  metoprolol tartrate (LOPRESSOR) 25 MG tablet, Take 0.5 tablets (12.5 mg total) by mouth at bedtime., Disp: 30 tablet, Rfl: 2 .  NITROSTAT 0.4 MG SL tablet, Place 0.4 mg under the tongue every 5 (five) minutes as needed for chest pain. , Disp: , Rfl:  .  ONETOUCH VERIO test strip, FOR USE WHEN CHECKING BLOOD GLUCOSE ONCE DAILY ALTERNATING AM AND PM BEFORE MEALS FINGER STICK 90 DAYS, Disp: , Rfl: 3 .  potassium chloride (K-DUR) 10 MEQ tablet, Take 2 tablets (20 mEq total) by mouth 2 (two) times daily., Disp: 120 tablet, Rfl: 2 .  pravastatin (PRAVACHOL) 40 MG tablet, Take 40 mg by mouth at bedtime. , Disp: , Rfl:  .  Spacer/Aero-Holding Chambers (AEROCHAMBER MINI CHAMBER) DEVI, 1 Device by Does not apply route as directed., Disp: 1 Device, Rfl: 0 .  traZODone (DESYREL) 50 MG tablet, Take 50 mg by mouth at bedtime., Disp: , Rfl:  .  triamterene-hydrochlorothiazide (DYAZIDE) 37.5-25 MG per  capsule, Take 1 capsule by mouth daily. , Disp: , Rfl:  .  aspirin 81 MG tablet, Take 1 tablet (81 mg total) by mouth every 6 (six) hours as needed for mild pain. (Patient not taking: Reported on 02/06/2018), Disp: , Rfl:  .  vancomycin (VANCOCIN) 125 MG capsule, Take 1 capsule (125 mg total) by mouth 4 (four) times daily. (Patient not taking: Reported on 02/06/2018), Disp: 20 capsule, Rfl: 0   Garner Nash, DO [facility] 02/06/2018 1:29 PM

## 2018-02-06 NOTE — Progress Notes (Signed)
Patient completed full PFT today. 

## 2018-02-07 ENCOUNTER — Telehealth: Payer: Self-pay

## 2018-02-07 DIAGNOSIS — R7989 Other specified abnormal findings of blood chemistry: Secondary | ICD-10-CM

## 2018-02-07 LAB — PRO B NATRIURETIC PEPTIDE: NT-Pro BNP: 5893 pg/mL — ABNORMAL HIGH (ref 0–301)

## 2018-02-07 NOTE — Telephone Encounter (Signed)
Spoke with patient, made aware of results per DO Icard. Voiced understanding. Patient okay with placing referral to nephrologist. No further questions or concerns at this time. Nothing further needed.

## 2018-02-07 NOTE — Telephone Encounter (Signed)
-----   Message from Garner Nash, DO sent at 02/07/2018  8:58 AM EDT ----- Diane [name] can let her know the pleural effusion is gone. She has mild pulmonary edema. This may be contributing to her SOB. We are awaiting her pBNP results. But her serum creatine has remained elevated and she needs to follow up with her PCP regarding this. I would consider a referral to a nephrologist if she has not seen one before.  Thanks [place], DO [facility] 02/07/2018 8:58 AM

## 2018-02-08 ENCOUNTER — Telehealth (HOSPITAL_COMMUNITY): Payer: Self-pay

## 2018-02-08 NOTE — Telephone Encounter (Signed)
Called and spoke with patient in regards to Pulmonary Rehab - Scheduled orientation on 03/15/18 at 9:30am. Patient will attend the 1:30pm exc class. Mailed packet.

## 2018-02-08 NOTE — Telephone Encounter (Signed)
Patients insurance is active and benefits verified through [facility] - $20.00 co-pay, no deductible, out of pocket amount of $6,700/$4,182.05 has been met, no co-insurance, and no pre-authorization is required. Spoke with Gardiner Barefoot at [facility] - Reference [phone]

## 2018-03-15 ENCOUNTER — Encounter (HOSPITAL_COMMUNITY): Payer: Self-pay

## 2018-03-15 ENCOUNTER — Encounter (HOSPITAL_COMMUNITY)
Admission: RE | Admit: 2018-03-15 | Discharge: 2018-03-15 | Disposition: A | Discharge status: Home / Self Care | Payer: Medicare Other | Source: Ambulatory Visit | Attending: Pulmonary Disease | Admitting: Pulmonary Disease

## 2018-03-15 VITALS — BP 128/66 | HR 54 | Temp 98.3°F | Resp 22 | Ht 68.0 in | Wt 196.2 lb

## 2018-03-15 DIAGNOSIS — J90 Pleural effusion, not elsewhere classified: Secondary | ICD-10-CM | POA: Insufficient documentation

## 2018-03-15 NOTE — Progress Notes (Signed)
Diane [name] 77 y.o. female Pulmonary Rehab Orientation Note Diane referred to pulmonary rehab by Dr. Valeta Harms for Right pleural effusion, arrived today in Cardiac and Pulmonary Rehab for orientation to Pulmonary Rehab. She was transported from [employer] via wheel chair. She does not carry portable oxygen. Per she uses oxygen never. Color good, skin warm and dry. Patient is oriented to time and place. Patient's medical history, psychosocial health, and medications reviewed. Pr reports to pulmonary rehab staff that she also has 4 leaky valves.  Pt is to see Dr. Terrence Dupont next month in the office to discuss plan for surgery or TAVR procedure.  Called office and obtained office note and echo report for review.  Unclear whether ok for pt to proceed with pulmonary rehab at this point or wait until valve is fixed and then do cardiac rehab.  Would need to weigh in the benefits of doing an exercise program individualized for her medical history prior to any type of cardiac procedure.  Paged Dr. Terrence Dupont, received call back.  Dr. Terrence Dupont is here at the hospital and does not have his notes to refer to.  Will call back later this afternoon after he has had a chance to review pt record. Psychosocial assessment reveals pt lives alone. Pt recently moved here to be closer to her granddaughter and great grandchildren. Pt daughter lives here as well but their is a bit of a strain.  Pt is currently retired. Pt hobbies include crossword puzzles and spending time with great grandchildern. Pt reports her stress level is moderate. Areas of stress/anxiety include Finances and transportation. Pt is on a fixed income and receives no additional assistance.  Pt car works but needs some additional repairs that she hopes she can get done next year. Pt is unsure she can afford the 20.00 copay for pulmonary rehab.  Pt does not exhibit signs of depression. Pt admits that several years back when she received the diagnosis of breast CA she  had some "blue"days. Pt takes Prozac for temper control. PHQ2/9 score 0/8. Pt shows good  coping skills with positive outlook . Diane offered emotional support and reassurance. Will continue to monitor and evaluate progress toward psychosocial goal(s) of continued mental well being and financial security. Physical assessment reveals heart rate is irregular, pt has history of afib.  Pt is on pradexa and cardizem, breath sounds clear to auscultation, no wheezes, rales, or rhonchi. Pt complains of non productive cough and difficulty with breathing when walking. Grip strength equal, strong. Distal pulses palpable with trace swelling to left ankle. Pt is on diuretic therapy. Patient reports she does take medications as prescribed. Patient states she follows a Regular diet. The patient reports no specific efforts to gain or lose weight. Pt is ok with her weight but wouldn't;t mind losing weight,  The only drawback is she would need additional clothes and financial this would be a hardship. Patient's weight will be monitored closely. Demonstration and practice of PLB using pulse oximeter. Patient able to return demonstration satisfactorily. Safety and hand hygiene in the exercise area reviewed with patient. Patient voices understanding of the information reviewed. Department expectations discussed with patient and achievable goals were set. The patient shows enthusiasm about attending the program and we look forward to working with this nice lady. Planning to hold on scheduling for a 6 min walk test and to  exercise until cleared to do so by Dr. Terrence Dupont and if pt feels she can afford to participate. 45 minutes was spent  on a variety of activities such as assessment of the patient, obtaining baseline data including height, weight, BMI, and grip strength, verifying medical history, allergies, and current medications, and teaching patient strategies for performing tasks with less respiratory effort with emphasis on pursed  lip breathing. Maurice Small RN, BSN Cardiac and Training and development officer [phone]

## 2018-03-28 ENCOUNTER — Telehealth (HOSPITAL_COMMUNITY): Payer: Self-pay | Admitting: *Deleted

## 2018-03-28 NOTE — Telephone Encounter (Signed)
Received response from Dr. Terrence Dupont. Message left for pt on answering machine.  Dr. Terrence Dupont is planning a work up for intervention for Mitral Clip verses MVR/Maze procedure.  Pt will need to hold off from pulmonary rehab.  Contact information provided for requested call back. Cherre Huger, BSN Cardiac and Pulmonary Rehab Nurse Navigator      '

## 2018-05-01 ENCOUNTER — Ambulatory Visit: Payer: Medicare Other | Admitting: Podiatry

## 2018-05-01 ENCOUNTER — Encounter: Payer: Self-pay | Admitting: Podiatry

## 2018-05-01 DIAGNOSIS — M2011 Hallux valgus (acquired), right foot: Secondary | ICD-10-CM

## 2018-05-01 DIAGNOSIS — M79676 Pain in unspecified toe(s): Secondary | ICD-10-CM

## 2018-05-01 DIAGNOSIS — M2012 Hallux valgus (acquired), left foot: Secondary | ICD-10-CM | POA: Diagnosis not present

## 2018-05-01 DIAGNOSIS — B351 Tinea unguium: Secondary | ICD-10-CM

## 2018-05-01 DIAGNOSIS — E1140 Type 2 diabetes mellitus with diabetic neuropathy, unspecified: Secondary | ICD-10-CM

## 2018-05-01 NOTE — Progress Notes (Signed)
Patient ID: [place] D Dugu[place], female   DOB: March 24, 1941, 77 y.o.   MRN: [id number]   Complaint:  Visit Type: Patient returns to my office for continued preventative foot care services. Complaint: Patient states" my nails have grown long and thick and become painful to walk and wear shoes" Patient has been diagnosed with DM with neuropathy.. The patient presents for preventative foot care services. No changes to ROS.  Patient says she desires diabetic shoes.  Podiatric Exam: Vascular: dorsalis pedis and posterior tibial pulses are palpable bilateral. Capillary return is immediate. Temperature gradient is WNL. Skin turgor WNL  Sensorium: Diminished  Semmes Weinstein monofilament test. Normal tactile sensation bilaterally. Nail Exam: Pt has thick disfigured discolored nails with subungual debris noted bilateral entire nail hallux through fifth toenails Ulcer Exam: There is no evidence of ulcer or pre-ulcerative changes or infection. Orthopedic Exam: Muscle tone and strength are WNL. No limitations in general ROM. No crepitus or effusions noted. Foot type and digits show no abnormalities.HAV 1st MPJ B/L. Skin: No Porokeratosis. No infection or ulcers  Diagnosis:  Onychomycosis, , Pain in right toe, pain in left toes  HAV  B/L.  Treatment & Plan Procedures and Treatment: Consent by patient was obtained for treatment procedures. The patient understood the discussion of treatment and procedures well. All questions were answered thoroughly reviewed. Debridement of mycotic and hypertrophic toenails, 1 through 5 bilateral and clearing of subungual debris. No ulceration, no infection noted.  .Patient qualifies for diabetic shoes due to DPN and HAV  B/L.   Return Visit-Office Procedure: Patient instructed to return to the office for a follow up visit 3 months for continued evaluation and treatment.   Gardiner Barefoot DPM

## 2018-05-02 ENCOUNTER — Inpatient Hospital Stay (HOSPITAL_COMMUNITY)
Admission: AD | Admit: 2018-05-02 | Discharge: 2018-05-20 | DRG: 216 | Disposition: A | Discharge status: Home Health | Payer: Medicare Other | Source: Ambulatory Visit | Attending: Thoracic Surgery (Cardiothoracic Vascular Surgery) | Admitting: Thoracic Surgery (Cardiothoracic Vascular Surgery)

## 2018-05-02 ENCOUNTER — Encounter (HOSPITAL_COMMUNITY)
Admission: AD | Disposition: A | Discharge status: Home Health | Payer: Self-pay | Source: Ambulatory Visit | Attending: Thoracic Surgery (Cardiothoracic Vascular Surgery)

## 2018-05-02 ENCOUNTER — Encounter (HOSPITAL_COMMUNITY): Payer: Self-pay | Admitting: General Practice

## 2018-05-02 ENCOUNTER — Other Ambulatory Visit: Payer: Self-pay

## 2018-05-02 DIAGNOSIS — I5033 Acute on chronic diastolic (congestive) heart failure: Secondary | ICD-10-CM | POA: Diagnosis present

## 2018-05-02 DIAGNOSIS — R001 Bradycardia, unspecified: Secondary | ICD-10-CM | POA: Diagnosis not present

## 2018-05-02 DIAGNOSIS — R0689 Other abnormalities of breathing: Secondary | ICD-10-CM

## 2018-05-02 DIAGNOSIS — M797 Fibromyalgia: Secondary | ICD-10-CM | POA: Diagnosis present

## 2018-05-02 DIAGNOSIS — I2510 Atherosclerotic heart disease of native coronary artery without angina pectoris: Secondary | ICD-10-CM | POA: Diagnosis present

## 2018-05-02 DIAGNOSIS — Z7901 Long term (current) use of anticoagulants: Secondary | ICD-10-CM

## 2018-05-02 DIAGNOSIS — F29 Unspecified psychosis not due to a substance or known physiological condition: Secondary | ICD-10-CM | POA: Diagnosis not present

## 2018-05-02 DIAGNOSIS — Z820 Family history of epilepsy and other diseases of the nervous system: Secondary | ICD-10-CM

## 2018-05-02 DIAGNOSIS — I454 Nonspecific intraventricular block: Secondary | ICD-10-CM | POA: Diagnosis present

## 2018-05-02 DIAGNOSIS — I071 Rheumatic tricuspid insufficiency: Secondary | ICD-10-CM | POA: Diagnosis present

## 2018-05-02 DIAGNOSIS — R451 Restlessness and agitation: Secondary | ICD-10-CM | POA: Diagnosis not present

## 2018-05-02 DIAGNOSIS — Z8249 Family history of ischemic heart disease and other diseases of the circulatory system: Secondary | ICD-10-CM

## 2018-05-02 DIAGNOSIS — I083 Combined rheumatic disorders of mitral, aortic and tricuspid valves: Principal | ICD-10-CM | POA: Diagnosis present

## 2018-05-02 DIAGNOSIS — K089 Disorder of teeth and supporting structures, unspecified: Secondary | ICD-10-CM

## 2018-05-02 DIAGNOSIS — J9811 Atelectasis: Secondary | ICD-10-CM | POA: Diagnosis present

## 2018-05-02 DIAGNOSIS — I313 Pericardial effusion (noninflammatory): Secondary | ICD-10-CM | POA: Diagnosis present

## 2018-05-02 DIAGNOSIS — J449 Chronic obstructive pulmonary disease, unspecified: Secondary | ICD-10-CM

## 2018-05-02 DIAGNOSIS — N183 Chronic kidney disease, stage 3 unspecified: Secondary | ICD-10-CM | POA: Diagnosis present

## 2018-05-02 DIAGNOSIS — Z87891 Personal history of nicotine dependence: Secondary | ICD-10-CM

## 2018-05-02 DIAGNOSIS — Z9221 Personal history of antineoplastic chemotherapy: Secondary | ICD-10-CM

## 2018-05-02 DIAGNOSIS — I509 Heart failure, unspecified: Secondary | ICD-10-CM

## 2018-05-02 DIAGNOSIS — Z6831 Body mass index (BMI) 31.0-31.9, adult: Secondary | ICD-10-CM

## 2018-05-02 DIAGNOSIS — D638 Anemia in other chronic diseases classified elsewhere: Secondary | ICD-10-CM | POA: Diagnosis present

## 2018-05-02 DIAGNOSIS — K0530 Chronic periodontitis, unspecified: Secondary | ICD-10-CM | POA: Diagnosis present

## 2018-05-02 DIAGNOSIS — I428 Other cardiomyopathies: Secondary | ICD-10-CM | POA: Diagnosis present

## 2018-05-02 DIAGNOSIS — I10 Essential (primary) hypertension: Secondary | ICD-10-CM | POA: Diagnosis present

## 2018-05-02 DIAGNOSIS — I272 Pulmonary hypertension, unspecified: Secondary | ICD-10-CM | POA: Diagnosis present

## 2018-05-02 DIAGNOSIS — M1990 Unspecified osteoarthritis, unspecified site: Secondary | ICD-10-CM | POA: Diagnosis present

## 2018-05-02 DIAGNOSIS — I340 Nonrheumatic mitral (valve) insufficiency: Secondary | ICD-10-CM | POA: Diagnosis present

## 2018-05-02 DIAGNOSIS — R410 Disorientation, unspecified: Secondary | ICD-10-CM | POA: Diagnosis not present

## 2018-05-02 DIAGNOSIS — Z91013 Allergy to seafood: Secondary | ICD-10-CM

## 2018-05-02 DIAGNOSIS — N939 Abnormal uterine and vaginal bleeding, unspecified: Secondary | ICD-10-CM | POA: Diagnosis present

## 2018-05-02 DIAGNOSIS — K029 Dental caries, unspecified: Secondary | ICD-10-CM | POA: Diagnosis present

## 2018-05-02 DIAGNOSIS — E119 Type 2 diabetes mellitus without complications: Secondary | ICD-10-CM | POA: Diagnosis present

## 2018-05-02 DIAGNOSIS — J441 Chronic obstructive pulmonary disease with (acute) exacerbation: Secondary | ICD-10-CM | POA: Diagnosis present

## 2018-05-02 DIAGNOSIS — Z853 Personal history of malignant neoplasm of breast: Secondary | ICD-10-CM

## 2018-05-02 DIAGNOSIS — D62 Acute posthemorrhagic anemia: Secondary | ICD-10-CM | POA: Diagnosis not present

## 2018-05-02 DIAGNOSIS — Z7984 Long term (current) use of oral hypoglycemic drugs: Secondary | ICD-10-CM

## 2018-05-02 DIAGNOSIS — Z0189 Encounter for other specified special examinations: Secondary | ICD-10-CM

## 2018-05-02 DIAGNOSIS — Z823 Family history of stroke: Secondary | ICD-10-CM

## 2018-05-02 DIAGNOSIS — Z8544 Personal history of malignant neoplasm of other female genital organs: Secondary | ICD-10-CM

## 2018-05-02 DIAGNOSIS — Z923 Personal history of irradiation: Secondary | ICD-10-CM

## 2018-05-02 DIAGNOSIS — E669 Obesity, unspecified: Secondary | ICD-10-CM | POA: Diagnosis present

## 2018-05-02 DIAGNOSIS — I4811 Longstanding persistent atrial fibrillation: Secondary | ICD-10-CM

## 2018-05-02 DIAGNOSIS — E785 Hyperlipidemia, unspecified: Secondary | ICD-10-CM | POA: Diagnosis present

## 2018-05-02 DIAGNOSIS — E1169 Type 2 diabetes mellitus with other specified complication: Secondary | ICD-10-CM | POA: Diagnosis present

## 2018-05-02 DIAGNOSIS — Z90710 Acquired absence of both cervix and uterus: Secondary | ICD-10-CM

## 2018-05-02 DIAGNOSIS — E1122 Type 2 diabetes mellitus with diabetic chronic kidney disease: Secondary | ICD-10-CM | POA: Diagnosis present

## 2018-05-02 DIAGNOSIS — I130 Hypertensive heart and chronic kidney disease with heart failure and stage 1 through stage 4 chronic kidney disease, or unspecified chronic kidney disease: Secondary | ICD-10-CM | POA: Diagnosis present

## 2018-05-02 DIAGNOSIS — Z452 Encounter for adjustment and management of vascular access device: Secondary | ICD-10-CM

## 2018-05-02 DIAGNOSIS — Z8679 Personal history of other diseases of the circulatory system: Secondary | ICD-10-CM

## 2018-05-02 DIAGNOSIS — I5032 Chronic diastolic (congestive) heart failure: Secondary | ICD-10-CM | POA: Diagnosis present

## 2018-05-02 DIAGNOSIS — Z9889 Other specified postprocedural states: Secondary | ICD-10-CM

## 2018-05-02 DIAGNOSIS — Z95828 Presence of other vascular implants and grafts: Secondary | ICD-10-CM

## 2018-05-02 DIAGNOSIS — I351 Nonrheumatic aortic (valve) insufficiency: Secondary | ICD-10-CM | POA: Diagnosis not present

## 2018-05-02 HISTORY — DX: Pulmonary hypertension, unspecified: I27.20

## 2018-05-02 HISTORY — DX: Rheumatic tricuspid insufficiency: I07.1

## 2018-05-02 HISTORY — DX: Longstanding persistent atrial fibrillation: I48.11

## 2018-05-02 HISTORY — DX: Nonrheumatic mitral (valve) insufficiency: I34.0

## 2018-05-02 HISTORY — DX: Personal history of other diseases of the circulatory system: Z86.79

## 2018-05-02 HISTORY — DX: Other specified postprocedural states: Z98.890

## 2018-05-02 HISTORY — PX: RIGHT/LEFT HEART CATH AND CORONARY ANGIOGRAPHY: CATH118266

## 2018-05-02 LAB — POCT I-STAT 3, VENOUS BLOOD GAS (G3P V)
Acid-base deficit: 3 mmol/L — ABNORMAL HIGH (ref 0.0–2.0)
Acid-base deficit: 5 mmol/L — ABNORMAL HIGH (ref 0.0–2.0)
Bicarbonate: 22.7 mmol/L (ref 20.0–28.0)
Bicarbonate: 20.9 mmol/L (ref 20.0–28.0)
O2 Saturation: 33 %
O2 Saturation: 41 %
TCO2: 24 mmol/L (ref 22–32)
TCO2: 22 mmol/L (ref 22–32)
pCO2, Ven: 41.7 mmHg — ABNORMAL LOW (ref 44.0–60.0)
pCO2, Ven: 38.9 mmHg — ABNORMAL LOW (ref 44.0–60.0)
pH, Ven: 7.345 (ref 7.250–7.430)
pH, Ven: 7.338 (ref 7.250–7.430)
pO2, Ven: 22 mmHg — CL (ref 32.0–45.0)
pO2, Ven: 25 mmHg — CL (ref 32.0–45.0)

## 2018-05-02 LAB — POCT I-STAT 3, ART BLOOD GAS (G3+)
Acid-base deficit: 6 mmol/L — ABNORMAL HIGH (ref 0.0–2.0)
Bicarbonate: 17.8 mmol/L — ABNORMAL LOW (ref 20.0–28.0)
O2 Saturation: 87 %
TCO2: 19 mmol/L — ABNORMAL LOW (ref 22–32)
pCO2 arterial: 30.3 mmHg — ABNORMAL LOW (ref 32.0–48.0)
pH, Arterial: 7.376 (ref 7.350–7.450)
pO2, Arterial: 53 mmHg — ABNORMAL LOW (ref 83.0–108.0)

## 2018-05-02 LAB — GLUCOSE, CAPILLARY
Glucose-Capillary: 117 mg/dL — ABNORMAL HIGH (ref 70–99)
Glucose-Capillary: 54 mg/dL — ABNORMAL LOW (ref 70–99)
Glucose-Capillary: 196 mg/dL — ABNORMAL HIGH (ref 70–99)
Glucose-Capillary: 161 mg/dL — ABNORMAL HIGH (ref 70–99)
Glucose-Capillary: 216 mg/dL — ABNORMAL HIGH (ref 70–99)
Glucose-Capillary: 234 mg/dL — ABNORMAL HIGH (ref 70–99)

## 2018-05-02 LAB — POCT ACTIVATED CLOTTING TIME: Activated Clotting Time: 180 seconds

## 2018-05-02 SURGERY — RIGHT/LEFT HEART CATH AND CORONARY ANGIOGRAPHY
Anesthesia: LOCAL

## 2018-05-02 MED ORDER — TRAZODONE HCL 50 MG PO TABS
50.0000 mg | ORAL_TABLET | Freq: Every day | ORAL | Status: DC
  Administered 2018-05-02 – 2018-05-08 (×7): 50 mg via ORAL
  Filled 2018-05-02 (×7): qty 1

## 2018-05-02 MED ORDER — FENTANYL CITRATE (PF) 100 MCG/2ML IJ SOLN
INTRAMUSCULAR | Status: DC | PRN
  Administered 2018-05-02 (×2): 25 ug via INTRAVENOUS

## 2018-05-02 MED ORDER — SODIUM CHLORIDE 0.9 % IV SOLN: 250.0000 mL | INTRAVENOUS | Status: DC | PRN

## 2018-05-02 MED ORDER — ASPIRIN EC 81 MG PO TBEC
81.0000 mg | DELAYED_RELEASE_TABLET | Freq: Every day | ORAL | Status: DC
  Administered 2018-05-03 – 2018-05-08 (×6): 81 mg via ORAL
  Filled 2018-05-02 (×6): qty 1

## 2018-05-02 MED ORDER — METHYLPREDNISOLONE SODIUM SUCC 125 MG IJ SOLR
40.0000 mg | INTRAMUSCULAR | Status: DC
  Administered 2018-05-02: 40 mg via INTRAVENOUS

## 2018-05-02 MED ORDER — DIPHENHYDRAMINE HCL 50 MG/ML IJ SOLN
INTRAMUSCULAR | Status: AC
  Administered 2018-05-02: 25 mg via INTRAVENOUS
  Filled 2018-05-02: qty 1

## 2018-05-02 MED ORDER — HEPARIN (PORCINE) IN NACL 1000-0.9 UT/500ML-% IV SOLN
INTRAVENOUS | Status: DC | PRN
  Administered 2018-05-02: 500 mL

## 2018-05-02 MED ORDER — ASPIRIN 81 MG PO CHEW
324.0000 mg | CHEWABLE_TABLET | ORAL | Status: AC
  Administered 2018-05-02: 324 mg via ORAL
  Filled 2018-05-02: qty 4

## 2018-05-02 MED ORDER — SODIUM CHLORIDE 0.9% FLUSH: 3.0000 mL | Freq: Two times a day (BID) | INTRAVENOUS | Status: DC

## 2018-05-02 MED ORDER — ASPIRIN 300 MG RE SUPP: 300.0000 mg | RECTAL | Status: AC

## 2018-05-02 MED ORDER — SODIUM CHLORIDE 0.9 % WEIGHT BASED INFUSION
3.0000 mL/kg/h | INTRAVENOUS | Status: DC
  Administered 2018-05-02: 3 mL/kg/h via INTRAVENOUS

## 2018-05-02 MED ORDER — ASPIRIN 81 MG PO CHEW
CHEWABLE_TABLET | ORAL | Status: AC
  Administered 2018-05-02: 81 mg via ORAL
  Filled 2018-05-02: qty 1

## 2018-05-02 MED ORDER — DIPHENHYDRAMINE HCL 50 MG/ML IJ SOLN
25.0000 mg | Freq: Once | INTRAMUSCULAR | Status: AC
  Administered 2018-05-02: 25 mg via INTRAVENOUS

## 2018-05-02 MED ORDER — HEPARIN SODIUM (PORCINE) 1000 UNIT/ML IJ SOLN
INTRAMUSCULAR | Status: DC | PRN
  Administered 2018-05-02: 3000 [IU] via INTRAVENOUS

## 2018-05-02 MED ORDER — ATORVASTATIN CALCIUM 10 MG PO TABS
20.0000 mg | ORAL_TABLET | Freq: Every day | ORAL | Status: DC
  Administered 2018-05-02 – 2018-05-08 (×7): 20 mg via ORAL
  Filled 2018-05-02 (×7): qty 2

## 2018-05-02 MED ORDER — ACETAMINOPHEN 325 MG PO TABS
650.0000 mg | ORAL_TABLET | ORAL | Status: DC | PRN
  Administered 2018-05-05 – 2018-05-07 (×2): 650 mg via ORAL
  Filled 2018-05-02 (×3): qty 2

## 2018-05-02 MED ORDER — MIDAZOLAM HCL 2 MG/2ML IJ SOLN
INTRAMUSCULAR | Status: AC
  Filled 2018-05-02: qty 2

## 2018-05-02 MED ORDER — SODIUM CHLORIDE 0.9% FLUSH
3.0000 mL | Freq: Two times a day (BID) | INTRAVENOUS | Status: DC
  Administered 2018-05-02 – 2018-05-08 (×11): 3 mL via INTRAVENOUS

## 2018-05-02 MED ORDER — MIDAZOLAM HCL 2 MG/2ML IJ SOLN
INTRAMUSCULAR | Status: DC | PRN
  Administered 2018-05-02: 1 mg via INTRAVENOUS

## 2018-05-02 MED ORDER — ALBUTEROL SULFATE (2.5 MG/3ML) 0.083% IN NEBU: 2.5000 mg | INHALATION_SOLUTION | RESPIRATORY_TRACT | Status: DC | PRN

## 2018-05-02 MED ORDER — LEVALBUTEROL HCL 0.63 MG/3ML IN NEBU
0.6300 mg | INHALATION_SOLUTION | Freq: Four times a day (QID) | RESPIRATORY_TRACT | Status: DC | PRN
  Filled 2018-05-02: qty 3

## 2018-05-02 MED ORDER — ALBUTEROL SULFATE (2.5 MG/3ML) 0.083% IN NEBU
INHALATION_SOLUTION | RESPIRATORY_TRACT | Status: AC
  Filled 2018-05-02: qty 3

## 2018-05-02 MED ORDER — HEPARIN (PORCINE) 25000 UT/250ML-% IV SOLN
900.0000 [IU]/h | INTRAVENOUS | Status: DC
  Administered 2018-05-02 – 2018-05-03 (×2): 1100 [IU]/h via INTRAVENOUS
  Administered 2018-05-04 – 2018-05-07 (×3): 900 [IU]/h via INTRAVENOUS
  Filled 2018-05-02 (×5): qty 250

## 2018-05-02 MED ORDER — SODIUM CHLORIDE 0.9% FLUSH: 3.0000 mL | INTRAVENOUS | Status: DC | PRN

## 2018-05-02 MED ORDER — SODIUM CHLORIDE 0.9 % WEIGHT BASED INFUSION: 1.0000 mL/kg/h | INTRAVENOUS | Status: DC

## 2018-05-02 MED ORDER — FUROSEMIDE 10 MG/ML IJ SOLN
INTRAMUSCULAR | Status: AC
  Filled 2018-05-02: qty 4

## 2018-05-02 MED ORDER — SODIUM CHLORIDE 0.9 % WEIGHT BASED INFUSION: 1.0000 mL/kg/h | INTRAVENOUS | Status: AC

## 2018-05-02 MED ORDER — FLUOXETINE HCL 10 MG PO CAPS
10.0000 mg | ORAL_CAPSULE | Freq: Every day | ORAL | Status: DC
  Administered 2018-05-02 – 2018-05-08 (×7): 10 mg via ORAL
  Filled 2018-05-02 (×7): qty 1

## 2018-05-02 MED ORDER — FENTANYL CITRATE (PF) 100 MCG/2ML IJ SOLN
INTRAMUSCULAR | Status: AC
  Filled 2018-05-02: qty 2

## 2018-05-02 MED ORDER — ONDANSETRON HCL 4 MG/2ML IJ SOLN: 4.0000 mg | Freq: Four times a day (QID) | INTRAMUSCULAR | Status: DC | PRN

## 2018-05-02 MED ORDER — DILTIAZEM HCL ER COATED BEADS 180 MG PO CP24
180.0000 mg | ORAL_CAPSULE | Freq: Every day | ORAL | Status: DC
  Administered 2018-05-02 – 2018-05-08 (×7): 180 mg via ORAL
  Filled 2018-05-02 (×7): qty 1

## 2018-05-02 MED ORDER — LIDOCAINE HCL (PF) 1 % IJ SOLN
INTRAMUSCULAR | Status: AC
  Filled 2018-05-02: qty 30

## 2018-05-02 MED ORDER — FUROSEMIDE 10 MG/ML IJ SOLN
40.0000 mg | Freq: Once | INTRAMUSCULAR | Status: AC
  Administered 2018-05-02: 40 mg via INTRAVENOUS

## 2018-05-02 MED ORDER — ASPIRIN 81 MG PO CHEW
81.0000 mg | CHEWABLE_TABLET | ORAL | Status: AC
  Administered 2018-05-02: 81 mg via ORAL

## 2018-05-02 MED ORDER — IOHEXOL 350 MG/ML SOLN
INTRAVENOUS | Status: DC | PRN
  Administered 2018-05-02: 45 mL via INTRA_ARTERIAL

## 2018-05-02 MED ORDER — INSULIN ASPART 100 UNIT/ML ~~LOC~~ SOLN
0.0000 [IU] | Freq: Three times a day (TID) | SUBCUTANEOUS | Status: DC
  Administered 2018-05-02: 2 [IU] via SUBCUTANEOUS
  Administered 2018-05-02: 3 [IU] via SUBCUTANEOUS
  Administered 2018-05-03 – 2018-05-08 (×4): 1 [IU] via SUBCUTANEOUS

## 2018-05-02 MED ORDER — LEVALBUTEROL HCL 1.25 MG/0.5ML IN NEBU
1.2500 mg | INHALATION_SOLUTION | Freq: Three times a day (TID) | RESPIRATORY_TRACT | Status: DC
  Administered 2018-05-02 – 2018-05-03 (×3): 1.25 mg via RESPIRATORY_TRACT
  Filled 2018-05-02 (×3): qty 0.5

## 2018-05-02 MED ORDER — METHYLPREDNISOLONE SODIUM SUCC 125 MG IJ SOLR
INTRAMUSCULAR | Status: AC
  Administered 2018-05-02: 40 mg via INTRAVENOUS
  Filled 2018-05-02: qty 2

## 2018-05-02 MED ORDER — DIPHENHYDRAMINE HCL 25 MG PO CAPS: 50.0000 mg | ORAL_CAPSULE | Freq: Once | ORAL | Status: AC

## 2018-05-02 MED ORDER — NITROGLYCERIN 0.4 MG SL SUBL: 0.4000 mg | SUBLINGUAL_TABLET | SUBLINGUAL | Status: DC | PRN

## 2018-05-02 MED ORDER — LIDOCAINE HCL (PF) 1 % IJ SOLN
INTRAMUSCULAR | Status: DC | PRN
  Administered 2018-05-02: 15 mL

## 2018-05-02 SURGICAL SUPPLY — 12 items
CATH INFINITI 5 FR 3DRC (CATHETERS) ×1 IMPLANT
CATH INFINITI 5FR MULTPACK ANG (CATHETERS) ×1 IMPLANT
CATH SWAN GANZ 7F STRAIGHT (CATHETERS) ×1 IMPLANT
KIT HEART LEFT (KITS) ×2 IMPLANT
PACK CARDIAC CATHETERIZATION (CUSTOM PROCEDURE TRAY) ×2 IMPLANT
SHEATH PINNACLE 5F 10CM (SHEATH) ×1 IMPLANT
SHEATH PINNACLE 7F 10CM (SHEATH) ×1 IMPLANT
TRANSDUCER W/STOPCOCK (MISCELLANEOUS) ×3 IMPLANT
TUBING ART PRESS 72  MALE/FEM (TUBING) ×1
TUBING ART PRESS 72 MALE/FEM (TUBING) IMPLANT
WIRE EMERALD 3MM-J .025X260CM (WIRE) ×1 IMPLANT
WIRE EMERALD 3MM-J .035X150CM (WIRE) ×1 IMPLANT

## 2018-05-02 NOTE — Interval H&P Note (Signed)
Cath Lab Visit (complete for each Cath Lab visit)  Clinical Evaluation Leading to the Procedure:   ACS: No.  Non-ACS:    Anginal Classification: CCS III  Anti-ischemic medical therapy: Maximal Therapy (2 or more classes of medications)  Non-Invasive Test Results: No non-invasive testing performed  Prior CABG: No previous CABG      History and Physical Interval Note:  05/02/2018 7:33 AM  Diane [name]  has presented today for surgery, with the diagnosis of cp  The various methods of treatment have been discussed with the patient and family. After consideration of risks, benefits and other options for treatment, the patient has consented to  Procedure(s): RIGHT/LEFT HEART CATH AND CORONARY ANGIOGRAPHY (N/A) as a surgical intervention .  The patient's history has been reviewed, patient examined, no change in status, stable for surgery.  I have reviewed the patient's chart and labs.  Questions were answered to the patient's satisfaction.     [name]

## 2018-05-02 NOTE — Progress Notes (Signed)
ANTICOAGULATION CONSULT NOTE - Initial Consult  Pharmacy Consult for heparin Indication: atrial fibrillation  Allergies  Allergen Reactions  . Shellfish Allergy Anaphylaxis    Patient Measurements: Height: 5\' 7"  (170.2 cm) Weight: 182 lb (82.6 kg) IBW/kg (Calculated) : 61.6 Heparin Dosing Weight: 78kg  Vital Signs: Temp: 97.5 F (36.4 C) (12/05 1406) Temp Source: Oral (12/05 1406) BP: 160/71 (12/05 1406) Pulse Rate: 80 (12/05 1406)  Labs: No results for input(s): HGB, HCT, PLT, APTT, LABPROT, INR, HEPARINUNFRC, HEPRLOWMOCWT, CREATININE, CKTOTAL, CKMB, TROPONINI in the last 72 hours.  CrCl cannot be calculated (Patient's most recent lab result is older than the maximum 21 days allowed.).   Medical History: Past Medical History:  Diagnosis Date  . Anxiety   . Arthritis    back  . Atrial fibrillation ([place])   . Breast cancer ([place])    right breast cancer - 3 years ago  . Chronic diastolic heart failure ([place]) 01/03/2018   ECHO: 06/20/2017 - Grade 2 Diastolic Dysfunction   . Coronary artery disease   . Depression   . Diabetes mellitus without complication ([facility])    diet  . Dysrhythmia    afib  . Fibromyalgia   . Herniated disc   . Hyperlipemia   . Hypertension   . Iron deficiency anemia   . Lower back pain   . Lumbar stenosis    L4-5  . S/P radiation therapy 01/23/2013-03/06/2013   Right breast / 46 Gy in 23 fractions / Right breast boost / 14 Gy in 7 fractions  . Severe mitral valve regurgitation 04/2018  . Spondylolysis   . 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 Status post chemotherapy 11/04/2012 - 01/06/2013.   Docetaxel/Cytoxan Q 3 Weeks x 4 cycles  . Torn rotator cuff      Assessment: 32 yoM with hx of AFib on dabigatran PTA (last dose 12/3) admitted for heart catheterization and found to have severe MVR. Pt to start on IV heparin bridge while awaiting TCTS consult. Femoral sheath removed ~0900 per cath log.  Goal of Therapy:   Heparin level 0.3-0.7 units/ml Monitor platelets by anticoagulation protocol: Yes   Plan:  Start IV heparin 1100 units/hr at 1730 Check 8hr heparin level  Arrie Senate, PharmD, BCPS Clinical Pharmacist [phone] Please check AMION for all [place] numbers 05/02/2018

## 2018-05-02 NOTE — H&P (Signed)
Dictated H&P in the chart needs to be scanned 

## 2018-05-02 NOTE — CV Procedure (Signed)
5 Fr R FA and a 7 Fr R FV sheats were pulled mnnually, and pressure was held for 30 min. The patient  Was very restless and SOB laying lower on the stretcher. Instructions were given to her about bed rest. HR - A-fib BP - 164/78 Spo2 93% on 3 L/min [place]

## 2018-05-02 NOTE — Consult Note (Addendum)
[address]       [place] [address]             [phone]          CARDIOTHORACIC SURGERY CONSULTATION REPORT  PCP is Lajean Manes, MD Referring Provider is [name], MD  Reason for consultation:  Severe mitral regurgitation  HPI:  Patient is a 77 year old African-American female with history of mitral regurgitation, persistent atrial fibrillation, hypertension, chronic diastolic congestive heart failure, type 2 diabetes mellitus, hyperlipidemia, iron deficient anemia, arthritis, and depression who has been referred for surgical consultation for management of severe mitral regurgitation.  Patient states that she has had an irregular heart rhythm for many years and more recently she has known of the presence of a heart murmur.  She denies any known history of rheumatic fever in the distant past.  She has been followed by Dr. Terrence Dupont for several years.  She is chronically anticoagulated using Xarelto for atrial fibrillation.  She has a long history of symptoms of shortness of breath which has progressed dramatically over the past year.  She was hospitalized in January 2019 with an exacerbation of shortness of breath coughing and some chills.  Chest x-ray revealed pulmonary vascular congestion with bilateral pleural effusions.  She was treated with intravenous Lasix and antibiotics for possible pneumonia.  She improved rapidly.  Nuclear stress test was low risk for ischemia.  Echocardiogram performed at that time revealed normal left ventricular size and systolic function with ejection fraction estimated 55 to 60%.  There was grade 2 diastolic dysfunction.  There was mild aortic insufficiency and what was felt to be moderate mitral regurgitation.  There was mild left atrial enlargement.    Over the past year the patient states that she has developed continued progression of symptoms of exertional shortness of breath.  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  She now gets short of breath with low-level activity and occasionally at rest.  She cannot lay flat in bed and typically sleeps on 4 pillows.  She occasionally wakes up in the middle night gasping for breath and her breathing is relieved by sitting straight up.  She has some lower extremity edema, worse on the left side.  She has not had tachypalpitations or dizzy spells.  She denies any chest pain or chest tightness.    The patient underwent left and right heart catheterization earlier today by Dr. Terrence Dupont.  She was found to have mild nonobstructive coronary artery disease.  There was severe pulmonary hypertension with PA pressures measured 47/13 central venous pressure 17.  Pulmonary capillary wedge pressure was not reported.  A transthoracic echocardiogram was ordered and cardiothoracic surgical consultation requested.  Patient is widowed and lives alone locally in [place].  She has 3 adult children and numerous grandchildren and great-grandchildren.  She drives an automobile and remains functionally independent.  She has recently started using a rolling walker when she has to walk distances because she gets tired and short of breath so easily.  She has some mild instability of gait and also uses a cane.  She has recently had 2 mechanical falls.  She states that she is primarily limited by severe exertional shortness of breath.  She can barely walk to the bathroom without stopping to take a breath.  Past Medical History:  Diagnosis Date  . Anxiety   . Arthritis    back  .  Atrial fibrillation ([facility])   . Breast cancer ([facility])    right breast cancer - 3 years ago  . Chronic diastolic heart failure (HCC)    ECHO: 06/20/2017 - Grade 2 Diastolic Dysfunction   . Coronary artery disease   . Depression   . Diabetes mellitus without complication ([facility])    diet  . Dysrhythmia    afib  . Fibromyalgia   . Herniated disc   . Hyperlipemia   .  Hypertension   . Iron deficiency anemia   . Lower back pain   . Lumbar stenosis    L4-5  . S/P radiation therapy 01/23/2013-03/06/2013   Right breast / 46 Gy in 23 fractions / Right breast boost / 14 Gy in 7 fractions  . Severe mitral valve regurgitation   . Spondylolysis   . 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 Status post chemotherapy 11/04/2012 - 01/06/2013.   Docetaxel/Cytoxan Q 3 Weeks x 4 cycles  . Torn rotator cuff     Past Surgical History:  Procedure Laterality Date  . ABDOMINAL HYSTERECTOMY    . BREAST BIOPSY Right 09/04/2012  . BREAST LUMPECTOMY Right 10/08/2012  . COLONOSCOPY    . DILATION AND CURETTAGE OF UTERUS    . LEFT HEART CATHETERIZATION WITH CORONARY ANGIOGRAM N/A 09/14/2014   Procedure: LEFT HEART CATHETERIZATION WITH CORONARY ANGIOGRAM;  Surgeon: [name], MD;  Location: [facility] CATH LAB;  Service: Cardiovascular;  Laterality: N/A;  . OPEN REDUCTION INTERNAL FIXATION (ORIF) PROXIMAL PHALANX Right 01/04/2016   Procedure: OPEN TREATMENT OF RIGHT THUMB PROXIMAL PHALANX FRACTURE;  Surgeon: Milly Jakob, MD;  Location: [facility];  Service: Orthopedics;  Laterality: Right;  . PARTIAL MASTECTOMY WITH NEEDLE LOCALIZATION AND AXILLARY SENTINEL LYMPH NODE BX Right 10/08/2012   Procedure: RIGHT PARTIAL MASTECTOMY WITH NEEDLE LOCALIZATION AND RIGHT AXILLARY SENTINEL LYMPH NODE BX;  Surgeon: Adin Hector, MD;  Location: [facility];  Service: General;  Laterality: Right;  Injection of 1% Methylene Blue dye into right breast  . PORTACATH PLACEMENT Left 10/08/2012   Procedure: INSERTION PORT-A-CATH WITH ULTRASOUND GUIDANCE;  Surgeon: Adin Hector, MD;  Location: [facility];  Service: General;  Laterality: Left;  Using 61F [place]  . Posterior Lumbar Arthrodesis, Pedicle screw fixation, Posterolateral Arthodesis  03/17/11  . SHOULDER ARTHROSCOPY WITH ROTATOR CUFF REPAIR AND SUBACROMIAL DECOMPRESSION  06/04/2012   Procedure: SHOULDER  ARTHROSCOPY WITH ROTATOR CUFF REPAIR AND SUBACROMIAL DECOMPRESSION;  Surgeon: Nita Sells, MD;  Location: [facility];  Service: Orthopedics;  Laterality: Left;  LEFT SHOULDER ARTHROSCOPY WITH ROTATOR CUFF REPAIR AND SUBACROMIAL DECOMPRESSION AND DISTAL CLAVICLE RESECTION  . TONSILLECTOMY    . TUBAL LIGATION    . VULVECTOMY PARTIAL    . Wide Local Excision  01/2010   Bilateral groin excisions, re-excision of vulva    Family History  Problem Relation Age of Onset  . Stroke Mother   . Alzheimer's disease Mother   . Heart attack Father   . Heart attack Brother   . Breast cancer Neg Hx     Social History   Socioeconomic History  . Marital status: Widowed    Spouse name: Not on file  . Number of children: Not on file  . Years of education: Not on file  . Highest education level: Not on file  Occupational History  . Occupation: retired  Scientific laboratory technician  . Financial resource strain: Not on file  . Food insecurity:  Worry: Not on file    Inability: Not on file  . Transportation needs:    Medical: Not on file    Non-medical: Not on file  Tobacco Use  . Smoking status: Former Smoker    Years: 50.00    Types: Cigarettes    Last attempt to quit: 05/29/2009    Years since quitting: 8.9  . Smokeless tobacco: Never Used  Substance and Sexual Activity  . Alcohol use: Yes    Comment: rare  . Drug use: No  . Sexual activity: Not Currently  Lifestyle  . Physical activity:    Days per week: Not on file    Minutes per session: Not on file  . Stress: Not on file  Relationships  . Social connections:    Talks on phone: Not on file    Gets together: Not on file    Attends religious service: Not on file    Active member of club or organization: Not on file    Attends meetings of clubs or organizations: Not on file    Relationship status: Not on file  . Intimate partner violence:    Fear of current or ex partner: Not on file    Emotionally abused: Not on file     Physically abused: Not on file    Forced sexual activity: Not on file  Other Topics Concern  . Not on file  Social History Narrative   Tobacco use cigarettes: former smoker, quit in year Sept 2011, Pack-year Hx: 73, tobacco history last updated 10/10/2013. No smoking. Per patient stopped smoking 9 months ago. No alcohol. Caffeine.: yes, 2+ servings daily, tea. No recreational drug use. Exercise: [employer] water aer[employer]. Marital status: widow.    Prior to Admission medications   Medication Sig Start Date End Date Taking? Authorizing Provider  acetaminophen (TYLENOL) 500 MG tablet Take 1,000 mg by mouth every 6 (six) hours as needed for headache (pain).   Yes [provider]  albuterol (PROVENTIL HFA;VENTOLIN HFA) 108 (90 BASE) MCG/ACT inhaler Inhale 2 puffs into the lungs every 4 (four) hours as needed for wheezing or shortness of breath. 05/12/15  Yes Melony Overly, MD  atorvastatin (LIPITOR) 20 MG tablet Take 20 mg by mouth daily.  01/12/18  Yes [provider]  dabigatran (PRADAXA) 75 MG CAPS Take 75 mg by mouth 2 (two) times daily.    Yes [provider]  diltiazem (CARDIZEM CD) 180 MG 24 hr capsule Take 180 mg by mouth daily.   Yes [provider]  FLUoxetine (PROZAC) 10 MG capsule Take 10 mg by mouth daily.   Yes [provider]  furosemide (LASIX) 40 MG tablet Take 1 tablet (40 mg total) by mouth daily. 09/04/17  Yes Hagler, Aaron Edelman, MD  losartan (COZAAR) 100 MG tablet Take 1 tablet (100 mg total) by mouth daily. 09/15/14  Yes [name], MD  metFORMIN (GLUCOPHAGE-XR) 500 MG 24 hr tablet Take 1 tablet (500 mg total) by mouth daily with breakfast. Total dose of 1000mg  daily Patient taking differently: Take 500 mg by mouth daily.  09/16/14  Yes [name], MD  potassium chloride (K-DUR) 10 MEQ tablet Take 2 tablets (20 mEq total) by mouth 2 (two) times daily. 06/24/17  Yes Dixie Dials, MD  traZODone (DESYREL) 50 MG tablet Take 50 mg by mouth at  bedtime.   Yes [provider]  NITROSTAT 0.4 MG SL tablet Place 0.4 mg under the tongue every 5 (five) minutes as needed for chest pain.  10/14/12   [provider]  ONETOUCH VERIO test strip FOR USE WHEN CHECKING BLOOD GLUCOSE ONCE DAILY ALTERNATING AM AND PM BEFORE MEALS FINGER STICK 28 DAYS 01/20/18   [provider]    Current Facility-Administered Medications  Medication Dose Route Frequency Provider Last Rate Last Dose  . 0.9 %  sodium chloride infusion  250 mL Intravenous PRN [name], MD      . acetaminophen (TYLENOL) tablet 650 mg  650 mg Oral Q4H PRN [name], MD      . albuterol (PROVENTIL) (2.5 MG/3ML) 0.083% nebulizer solution 2.5 mg  2.5 mg Inhalation Q4H PRN [name], MD      . Derrill Memo ON 05/03/2018] aspirin EC tablet 81 mg  81 mg Oral Daily [name], MD      . atorvastatin (LIPITOR) tablet 20 mg  20 mg Oral Daily [name], MD   20 mg at 05/02/18 1432  . diltiazem (CARDIZEM CD) 24 hr capsule 180 mg  180 mg Oral Daily [name], MD   180 mg at 05/02/18 1432  . FLUoxetine (PROZAC) capsule 10 mg  10 mg Oral Daily [name], MD   10 mg at 05/02/18 1432  . heparin ADULT infusion 100 units/mL (25000 units/26mL sodium chloride 0.45%)  1,100 Units/hr Intravenous Continuous Einar Grad, RPH 11 mL/hr at 05/02/18 1704 1,100 Units/hr at 05/02/18 1704  . insulin aspart (novoLOG) injection 0-9 Units  0-9 Units Subcutaneous TID WC [name], MD   3 Units at 05/02/18 1658  . levalbuterol (XOPENEX) nebulizer solution 0.63 mg  0.63 mg Nebulization Q6H PRN [name], MD      . levalbuterol Penne Lash) nebulizer solution 1.25 mg  1.25 mg Nebulization TID [name], MD   1.25 mg at 05/02/18 1225  . nitroGLYCERIN (NITROSTAT) SL tablet 0.4 mg  0.4 mg Sublingual Q5 min PRN [name], MD      . ondansetron (ZOFRAN) injection 4 mg  4 mg Intravenous Q6H PRN [name], MD      . sodium chloride flush (NS) 0.9 %  injection 3 mL  3 mL Intravenous Q12H Harwani, Prudencio Burly, MD      . sodium chloride flush (NS) 0.9 % injection 3 mL  3 mL Intravenous PRN [name], MD      . traZODone (DESYREL) tablet 50 mg  50 mg Oral QHS [name], MD        Allergies  Allergen Reactions  . Shellfish Allergy Anaphylaxis      Review of Systems:   General:  stable appetite, decreased energy, no weight gain, no weight loss, no fever  Cardiac:  no chest pain with exertion, no chest pain at rest, + SOB with exertion, occasional resting SOB, + PND, + orthopnea, no palpitations, + arrhythmia, + atrial fibrillation, + LE edema, no dizzy spells, no syncope  Respiratory:  + shortness of breath, no home oxygen, no productive cough, = dry cough, NO bronchitis, NO wheezing, NO hemoptysis, NO asthma, NO pain with inspiration or cough, NO sleep apnea, NO CPAP at night  GI:   no difficulty swallowing, no reflux, no frequent heartburn, no hiatal hernia, no abdominal pain, no constipation, no diarrhea, no hematochezia, no hematemesis, no melena  GU:   no dysuria,  no frequency, no urinary tract infection, no hematuria, no kidney stones, no kidney disease  Vascular:  no pain suggestive of claudication, no pain in feet, no leg cramps, no varicose veins, no DVT, no non-healing foot ulcer  Neuro:  no stroke, no TIA's, no seizures, no headaches, no temporary blindness one eye,  no slurred speech, no peripheral neuropathy, no chronic pain, mild instability of gait, no memory/cognitive dysfunction  Musculoskeletal: + arthritis - primarily involving the knees, no joint swelling, no myalgias, some difficulty walking, decreased mobility   Skin:   no rash, no itching, no skin infections, no pressure sores or ulcerations  Psych:   no anxiety, + depression, no nervousness, no unusual recent stress  Eyes:   no blurry vision, no floaters, no recent vision changes, + wears glasses or contacts  ENT:   no hearing loss, + loose or painful teeth, no  dentures, last saw dentist last year  Hematologic:  no easy bruising, no abnormal bleeding, no clotting disorder, no frequent epistaxis  Endocrine:  + diabetes, does check CBG's at home     Physical Exam:   BP (!) 160/71 (BP Location: Left Arm)   Pulse 80   Temp (!) 97.5 F (36.4 C) (Oral)   Resp (!) 27   Ht 5\' 7"  (1.702 m)   Wt 82.6 kg   SpO2 92%   BMI 28.51 kg/m   General:  Moderately obese,  well-appearing  HEENT:  Unremarkable, poor dentition  Neck:   no JVD, no bruits, no adenopathy   Chest:   clear to auscultation, symmetrical breath sounds, no wheezes, no rhonchi   CV:   RRR, grade II-III/VI holosystolic murmur best at apex  Abdomen:  soft, non-tender, no masses   Extremities:  warm, well-perfused, pulses diminished, mold lower extremity edema  Rectal/GU  Deferred  Neuro:   Grossly non-focal and symmetrical throughout  Skin:   Clean and dry, no rashes, no breakdown  Diagnostic Tests:  Lab Results: No results for input(s): WBC, HGB, HCT, PLT in the last 72 hours. BMET: No results for input(s): NA, K, CL, CO2, GLUCOSE, BUN, CREATININE, CALCIUM in the last 72 hours.  CBG (last 3)  Recent Labs    05/02/18 1203 05/02/18 1410 05/02/18 1623  GLUCAP 196* 161* 216*   PT/INR:  No results for input(s): LABPROT, INR in the last 72 hours.  CXR:  N/A   Transthoracic Echocardiography  Patient:    Diane [name], Diane [name] MR #:       [id number] Study Date: 06/20/2017 Gender:     F Age:        21 Height:     170.2 cm Weight:     92.3 kg BSA:        2.12 m^2 Pt. Status: Room:       [id number]   ADMITTING    [name], MD  ATTENDING    [name], MD  ORDERING     [name], MD  PERFORMING   [name], MD  REFERRING    [name], MD  SONOGRAPHER  Dance, Tiffany  cc:  ------------------------------------------------------------------- LV EF: 55% -   60%  ------------------------------------------------------------------- Indications:      CHF -  428.0.  ------------------------------------------------------------------- History:   PMH:   Atrial fibrillation.  Coronary artery disease. PMH:  Breast Cancer.  Risk factors:  Hypertension. Diabetes mellitus. Dyslipidemia.  ------------------------------------------------------------------- Study Conclusions  - Left ventricle: The cavity size was normal. Systolic function was   normal. The estimated ejection fraction was in the range of 55%   to 60%. Wall motion was normal; there were no regional wall   motion abnormalities. Features are consistent with a pseudonormal   left ventricular filling pattern, with concomitant abnormal  relaxation and increased filling pressure (grade 2 diastolic   dysfunction). - Aortic valve: There was mild regurgitation. - Mitral valve: There was moderate regurgitation directed   posteriorly. - Left atrium: The atrium was mildly dilated. - Atrial septum: No defect or patent foramen ovale was identified. - Pericardium, extracardiac: A trivial pericardial effusion was   identified. Features were not consistent with tamponade   physiology.  ------------------------------------------------------------------- Study data:  The previous study was not available, so comparison was made to the report of 03/22/2011.  Study status:  Routine. Procedure:  The patient reported no pain pre or post test. Transthoracic echocardiography. Image quality was adequate.  Study completion:  There were no complications.          Transthoracic echocardiography.  M-mode, complete 2D, spectral Doppler, and color Doppler.  Birthdate:  Patient birthdate: [birth year].  Age:  Patient is 77 yr old.  Sex:  Gender: female.    BMI: 31.8 kg/m^2.  Blood pressure:     154/67  Patient status:  Inpatient.  Study date: Study date: 06/20/2017. Study time: 02:13 PM.  Location:  Bedside.    -------------------------------------------------------------------  ------------------------------------------------------------------- Left ventricle:  The cavity size was normal. Systolic function was normal. The estimated ejection fraction was in the range of 55% to 60%. Wall motion was normal; there were no regional wall motion abnormalities. The deceleration time of the early transmitral flow velocity was decreased. Features are consistent with a pseudonormal left ventricular filling pattern, with concomitant abnormal relaxation and increased filling pressure (grade 2 diastolic dysfunction).  ------------------------------------------------------------------- Aortic valve:   Trileaflet; mildly calcified leaflets.  Doppler: There was mild regurgitation.  ------------------------------------------------------------------- Mitral valve:   Structurally normal valve.   Leaflet separation was normal.  Doppler:  Transvalvular velocity was within the normal range. There was no evidence for stenosis. There was moderate regurgitation directed posteriorly.    Valve area by pressure half-time: 3.93 cm^2. Indexed valve area by pressure half-time: 1.85 cm^2/m^2.    Peak gradient ([name]): 9 mm Hg.  ------------------------------------------------------------------- Left atrium:  The atrium was mildly dilated.  ------------------------------------------------------------------- Atrial septum:  No defect or patent foramen ovale was identified.   ------------------------------------------------------------------- Right ventricle:  The cavity size was normal. Wall thickness was normal. Systolic function was normal.  ------------------------------------------------------------------- Pulmonic valve:    Structurally normal valve.   Cusp separation was normal.  Doppler:  Transvalvular velocity was within the normal range. There was mild  regurgitation.  ------------------------------------------------------------------- Tricuspid valve:   Structurally normal valve.   Leaflet separation was normal.  Doppler:  Transvalvular velocity was within the normal range. There was no significant regurgitation.  ------------------------------------------------------------------- Right atrium:  The atrium was normal in size.  ------------------------------------------------------------------- Pericardium:  A trivial pericardial effusion was identified. Doppler:  Features were not consistent with tamponade physiology.   ------------------------------------------------------------------- Systemic veins: Inferior vena cava: The vessel was normal in size. The respirophasic diameter changes were in the normal range (>= 50%), consistent with normal central venous pressure.  ------------------------------------------------------------------- Measurements   Left ventricle                           Value          Reference  LV ID, ED, PLAX chordal                  51    mm       43 - 52  LV ID, ES, PLAX chordal  32    mm       23 - 38  LV fx shortening, PLAX chordal           37    %        >=29  LV PW thickness, ED                      13    mm       ----------  IVS/LV PW ratio, ED                      0.85           <=1.3    Ventricular septum                       Value          Reference  IVS thickness, ED                        11    mm       ----------    LVOT                                     Value          Reference  LVOT ID, S                               20    mm       ----------  LVOT area                                3.14  cm^2     ----------    Aortic valve                             Value          Reference  Aortic regurg pressure half-time         959   ms       ----------    Aorta                                    Value          Reference  Aortic root ID, ED                       28    mm        ----------  Ascending aorta ID, A-P, S               28    mm       ----------    Left atrium                              Value          Reference  LA ID, A-P, ES  51    mm       ----------  LA ID/bsa, A-P                   (H)     2.41  cm/m^2   <=2.2  LA volume, S                             114   ml       ----------  LA volume/bsa, S                         53.8  ml/m^2   ----------  LA volume, ES, 1-p A4C                   102   ml       ----------  LA volume/bsa, ES, 1-p A4C               48.1  ml/m^2   ----------  LA volume, ES, 1-p A2C                   124   ml       ----------  LA volume/bsa, ES, 1-p A2C               58.5  ml/m^2   ----------    Mitral valve                             Value          Reference  Mitral E-wave peak velocity              151   cm/s     ----------  Mitral A-wave peak velocity              47.7  cm/s     ----------  Mitral deceleration time                 190   ms       150 - 230  Mitral pressure half-time                56    ms       ----------  Mitral peak gradient, [name]                  9     mm Hg    ----------  Mitral E/A ratio, peak                   3.2            ----------  Mitral valve area, PHT, DP               3.93  cm^2     ----------  Mitral valve area/bsa, PHT, DP           1.85  cm^2/m^2 ----------    Tricuspid valve                          Value          Reference  Tricuspid regurg peak velocity           324   cm/s     ----------  Tricuspid peak RV-RA gradient  42    mm Hg    ----------    Right atrium                             Value          Reference  RA ID, S-I, ES, A4C              (H)     62.8  mm       34 - 49  RA area, ES, A4C                 (H)     23.8  cm^2     8.3 - 19.5  RA volume, ES, A/L                       72    ml       ----------  RA volume/bsa, ES, A/L                   34    ml/m^2   ----------    Right ventricle                          Value           Reference  RV ID, minor axis, ED, A4C base          27    mm       ----------  TAPSE                                    18.8  mm       ----------    Pulmonic valve                           Value          Reference  Pulmonic regurg velocity, ED             128   cm/s     ----------  Pulmonic regurg gradient, ED             7     mm Hg    ----------  Legend: (L)  and  (H)  mark values outside specified reference range.  ------------------------------------------------------------------- Prepared and Electronically Authenticated by  [name], MD 2019-01-23T18:34:45   RIGHT/LEFT HEART CATH AND CORONARY ANGIOGRAPHY  Conclusion    Prox LAD lesion is 30% stenosed.  Prox RCA lesion is 40% stenosed.  Mid RCA lesion is 15% stenosed.  Ost 1st Diag to 1st Diag lesion is 40% stenosed.  Hemodynamic findings consistent with severe pulmonary hypertension and mitral valve regurgitation.    Indications   Severe mitral regurgitation [I34.0 (ICD-10-CM)]  Procedural Details   Technical Details After obtaining the informed consent patient was brought to the Cath Lab and was placed on fluoroscopy table. Right groin was prepped and draped in usual fashion. 1% Xylocaine was used for local anesthesia in the right groin. Patient received total of 1 mg Versed and 50 mcg of IV fentanyl for conscious sedation total conscious sedation time was 61 minutes patient was monitored closely during the conscious sedation time. With the help of thinwall needle 5 French arterial and 7 French venous femoral sheaths were placed both the sheaths  were aspirated and flushed. Next 7 French thermodilution Swan-Ganz catheter was advanced under fluoroscopic guidance up to RA RV and into PA position O2 sats were recorded from RA and PA and cardiac outputs were done by thermodilution and Fick techniques. Small pressures were also recorded from RA RV and PA position. Next pigtail catheter was advanced over the wire under  fluoroscopic guidance up to the ascending aorta catheter was further advanced across aortic valve into the LV LV pressures were recorded. Next simultaneous pressures were recorded from PA and LV, RV and LV and RV and LV. Large V wave was noted suggestive of mitral regurgitation. Next pigtail catheter was pulled out from the LV into the aorta there was no gradient across the aortic valve. Next 5 French left Judkins catheter was advanced over the wire under fluoroscopic guidance up to the ascending aorta wire was pulled out the catheter was aspirated and connected to the Manifold cath was further advanced and engaged into left coronary ostium multiple views of the left system were taken next the catheter was disengaged and was pulled out over the wire and was replaced with a 5 [place] 3D right diagnostic catheter which was advanced over the wire under fluoroscopic guidance up to the ascending aorta wire was pulled out the catheter was aspirated and connected to the Manifold cath was further advanced and engaged into right coronary ostium multiple views of the right system were obtained next the catheter was disengaged and was pulled out over the wire. Sheaths were aspirated and flushed. Findings Right atrial pressures were mean of 17 RV 84/21 mean of 31 PA pressures were 47/13 LVEDP was 19 mmHg Cardiac output by Fick method was 3.94 L and by thermodilution 4.47 L/min. O2 sats from PA where 33% from our rate was 41% and aortic sats were 87% Left cardiac cath findings LV not done Left main was patent LAD has 30% proximal stenosis Diagonal 1 has 40 to 50% mid stenosis Ramus is large which is patent Left circumflex is patent proximally and then tapers down in AV groove after giving of moderate size OM1 OM1 is patent RCA has 40% proximal and 15 to 20% mid stenosis PDA and PLV branches are patent Total contrast used was 45 cc Patient tolerated procedure well there were no complications. Patient was transferred  to recovery room in stable condition Plan We will get CVTS consult for possible evaluation for MVR/maze procedure. Will repeat 2D echo Estimated blood loss <50 mL.   During this procedure medications were administered to achieve and maintain moderate conscious sedation while the patient's heart rate, blood pressure, and oxygen saturation were continuously monitored and I was present face-to-face 100% of this time.  Medications  (Filter: Administrations occurring from 05/02/18 0727 to 05/02/18 0858)  Medication Rate/Dose/Volume Action  Date Time   Heparin (Porcine) in NaCl 1000-0.9 UT/500ML-% SOLN (mL) 500 mL Given 05/02/18 0735   Total dose as of 05/02/18 1924        500 mL        Heparin (Porcine) in NaCl 1000-0.9 UT/500ML-% SOLN (mL) 500 mL Given 05/02/18 0735   Total dose as of 05/02/18 1924        500 mL        fentaNYL (SUBLIMAZE) injection (mcg) 25 mcg Given 05/02/18 0739   Total dose as of 05/02/18 1924 25 mcg Given 0749   50 mcg        midazolam (VERSED) injection (mg) 1 mg Given 05/02/18 0739  Total dose as of 05/02/18 1924        1 mg        lidocaine (PF) (XYLOCAINE) 1 % injection (mL) 15 mL Given 05/02/18 0744   Total dose as of 05/02/18 1924        15 mL        heparin injection (Units) 3,000 Units Given 05/02/18 0800   Total dose as of 05/02/18 1924        3,000 Units        iohexol (OMNIPAQUE) 350 MG/ML injection (mL) 45 mL Given 05/02/18 0841   Total dose as of 05/02/18 1924        45 mL        Sedation Time   Sedation Time Physician-1: 1 hour 1 minute 16 seconds  Coronary Findings   Diagnostic  Dominance: Right  Left Anterior Descending  Prox LAD lesion 30% stenosed  Prox LAD lesion is 30% stenosed.  First Diagonal Branch  Ost 1st Diag to 1st Diag lesion 40% stenosed  Ost 1st Diag to 1st Diag lesion is 40% stenosed.  Right Coronary Artery  Prox RCA lesion 40% stenosed  Prox RCA lesion is 40% stenosed.  Mid RCA lesion 15% stenosed  Mid RCA lesion is  15% stenosed.  Intervention   No interventions have been documented.  Right Heart   Right Heart Pressures Hemodynamic findings consistent with severe pulmonary hypertension and mitral valve regurgitation. Elevated LV EDP consistent with volume overload.  Right Atrium Right atrial pressure is elevated.  Coronary Diagrams   Diagnostic  Dominance: Right    Intervention   Implants    No implant documentation for this case.  MERGE Images   Show images for CARDIAC CATHETERIZATION   Link to Procedure Log   Procedure Log    Hemo Data    Most Recent Value  Fick Cardiac Output 3.94 L/min  Fick Cardiac Output Index 2.02 (L/min)/BSA  Thermal Cardiac Output 4.47 L/min  Thermal Cardiac Output Index 2.3 (L/min)/BSA  RA A Wave 17 mmHg  RA V Wave 23 mmHg  RA Mean 18 mmHg  RV Systolic Pressure 84 mmHg  RV Diastolic Pressure 21 mmHg  RV EDP 31 mmHg  PA Systolic Pressure 47 mmHg  PA Diastolic Pressure 13 mmHg  PA Mean 28 mmHg  AO Systolic Pressure 188 mmHg  AO Diastolic Pressure 70 mmHg  AO Mean 99 mmHg  LV Systolic Pressure 416 mmHg  LV Diastolic Pressure 3 mmHg  LV EDP 19 mmHg  AOp Systolic Pressure 606 mmHg  AOp Diastolic Pressure 72 mmHg  AOp Mean Pressure 301 mmHg  LVp Systolic Pressure 601 mmHg  LVp Diastolic Pressure 65 mmHg  LVp EDP Pressure 75 mmHg  TPVR Index 12.19 HRUI  TSVR Index 43.1 HRUI  TPVR/TSVR Ratio 0.28     Pulmonary Function Tests (02/06/2018)  Baseline      Post-bronchodilator  FVC  1.26 L  (51% predicted) FVC  1.32 L  (54% predicted) FEV1  1.05 L  (55% predicted) FEV1  1.15 L  (61% predicted) FEF25-75 1.07 L  (64% predicted) FEF25-75 1.86 L  (112% predicted)  TLC  4.11 L  (76% predicted) RV  2.28 L  (95% predicted) DLCO  44% predicted    Impression:  Patient is a 77 yo moderately obese female with mitral regurgitation, hypertension, persistent atrial fibrillation, and COPD with remote tobacco abuse who presents with acute exacerbation of  chronic shortness of breath c/w acute on chronic diastolic congestive heart  failure.  I have personally reviewed the transthoracic echocardiogram performed in January 2019 as well as the diagnostic cardiac catheterization performed earlier today.  The echocardiogram performed last January revealed normal left ventricular systolic function with mild to moderate left ventricular hypertrophy and significant diastolic dysfunction.  There was at least moderate mitral regurgitation and mild aortic insufficiency.  An up to date echocardiogram has been ordered but has not been performed.  Catheterization reveals mild nonobstructive coronary artery disease.  There is moderate to severe pulmonary hypertension.  It appears that pulmonary capillary wedge tracings were not obtained.    Plan:  At this point it is premature as to whether or not surgical intervention might be appropriate.  We will await the results of echocardiogram which has been ordered to further assess the severity of the patient's mitral regurgitation.  Transesophageal echocardiogram might prove to be necessary.  Will follow   I spent in excess of 90 minutes during the conduct of this hospital consultation and >50% of this time involved direct face-to-face encounter for counseling and/or coordination of the patient's care.    Valentina Gu. Roxy Manns, MD 05/02/2018 7:24 PM

## 2018-05-02 NOTE — Progress Notes (Signed)
Subjective:  Seen earlier today complaining of shortness of breath after the right and left cardiac catheterization.  Denies any chest pain.  O2 sats 94-95% on 3 L.  Objective:  Vital Signs in the last 24 hours: Temp:  [97.5 F (36.4 C)-97.8 F (36.6 C)] 97.5 F (36.4 C) (12/05 1406) Pulse Rate:  [0-94] 80 (12/05 1406) Resp:  [13-57] 27 (12/05 1300) BP: (133-170)/(64-92) 160/71 (12/05 1406) SpO2:  [0 %-98 %] 92 % (12/05 1406) FiO2 (%):  [36 %] 36 % (12/05 1225) Weight:  [82.6 kg] 82.6 kg (12/05 0552)  Intake/Output from previous day: No intake/output data recorded. Intake/Output from this shift: Total I/O In: -  Out: 500 [Urine:500]  Physical Exam: Neck: no adenopathy, no carotid bruit, no JVD and supple, symmetrical, trachea midline Lungs: decreased breath sounds at bases with faint wheezing Heart: irregularly irregular rhythm, S1, S2 normal and 2/6 systolic murmur noted Abdomen: soft, non-tender; bowel sounds normal; no masses,  no organomegaly Extremities: extremities normal, atraumatic, no cyanosis or edema and right groin stable  Lab Results: No results for input(s): WBC, HGB, PLT in the last 72 hours. No results for input(s): NA, K, CL, CO2, GLUCOSE, BUN, CREATININE in the last 72 hours. No results for input(s): TROPONINI in the last 72 hours.  Invalid input(s): CK, MB Hepatic Function Panel No results for input(s): PROT, ALBUMIN, AST, ALT, ALKPHOS, BILITOT, BILIDIR, IBILI in the last 72 hours. No results for input(s): CHOL in the last 72 hours. No results for input(s): PROTIME in the last 72 hours.  Imaging: Imaging results have been reviewed and No results found.  Cardiac Studies:  Assessment/Plan:  Severe symptomatic mitral regurgitation. Pulmonary hypertension. 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 Anemia of chronic disease. Hypertension. Diabetes mellitus. Hyperlipidemia. Osteoarthritis. Plan Continue present management. Received  Lasix 40 mg IV 1. Start Xopenex treatment as per orders Check 2-D echo. CVT S consult called for possible evaluation for MVR/Maze procedure/ligation of left atrial appendage Restart heparin per pharmacy protocol, no bolus please  LOS: 0 days    [name] 05/02/2018, 4:09 PM

## 2018-05-03 ENCOUNTER — Encounter (HOSPITAL_COMMUNITY): Payer: Self-pay | Admitting: Cardiology

## 2018-05-03 ENCOUNTER — Encounter (HOSPITAL_COMMUNITY)
Admission: AD | Disposition: A | Discharge status: Home Health | Payer: Self-pay | Source: Ambulatory Visit | Attending: Thoracic Surgery (Cardiothoracic Vascular Surgery)

## 2018-05-03 ENCOUNTER — Inpatient Hospital Stay (HOSPITAL_COMMUNITY): Payer: Medicare Other

## 2018-05-03 ENCOUNTER — Inpatient Hospital Stay (HOSPITAL_COMMUNITY)
Admission: AD | Admit: 2018-05-03 | Discharge: 2018-05-03 | Disposition: A | Discharge status: Home / Self Care | Payer: Medicare Other | Source: Ambulatory Visit | Attending: Cardiovascular Disease | Admitting: Cardiovascular Disease

## 2018-05-03 DIAGNOSIS — I340 Nonrheumatic mitral (valve) insufficiency: Secondary | ICD-10-CM

## 2018-05-03 HISTORY — PX: TEE WITHOUT CARDIOVERSION: SHX5443

## 2018-05-03 LAB — BLOOD GAS, ARTERIAL
Acid-base deficit: 1.9 mmol/L (ref 0.0–2.0)
Bicarbonate: 22.1 mmol/L (ref 20.0–28.0)
Drawn by: 313941
O2 Content: 2 L/min
O2 Saturation: 95.6 %
Patient temperature: 98
pCO2 arterial: 34.8 mmHg (ref 32.0–48.0)
pH, Arterial: 7.416 (ref 7.350–7.450)
pO2, Arterial: 79.9 mmHg — ABNORMAL LOW (ref 83.0–108.0)

## 2018-05-03 LAB — PROTIME-INR
INR: 1.43
Prothrombin Time: 17.3 seconds — ABNORMAL HIGH (ref 11.4–15.2)

## 2018-05-03 LAB — LIPID PANEL
Cholesterol: 107 mg/dL (ref 0–200)
HDL: 46 mg/dL (ref >40)
LDL Cholesterol: 57 mg/dL (ref 0–99)
Total CHOL/HDL Ratio: 2.3 RATIO
Triglycerides: 22 mg/dL (ref <150)
VLDL: 4 mg/dL (ref 0–40)

## 2018-05-03 LAB — COMPREHENSIVE METABOLIC PANEL
ALT: 16 U/L (ref 0–44)
AST: 18 U/L (ref 15–41)
Albumin: 3.2 g/dL — ABNORMAL LOW (ref 3.5–5.0)
Alkaline Phosphatase: 76 U/L (ref 38–126)
Anion gap: 13 (ref 5–15)
BUN: 25 mg/dL — ABNORMAL HIGH (ref 8–23)
CO2: 20 mmol/L — ABNORMAL LOW (ref 22–32)
Calcium: 8.7 mg/dL — ABNORMAL LOW (ref 8.9–10.3)
Chloride: 106 mmol/L (ref 98–111)
Creatinine, Ser: 1.68 mg/dL — ABNORMAL HIGH (ref 0.44–1.00)
GFR calc Af Amer: 34 mL/min — ABNORMAL LOW (ref >60)
GFR calc non Af Amer: 30 mL/min — ABNORMAL LOW (ref >60)
Glucose, Bld: 173 mg/dL — ABNORMAL HIGH (ref 70–99)
Potassium: 3.8 mmol/L (ref 3.5–5.1)
Sodium: 139 mmol/L (ref 135–145)
Total Bilirubin: 0.8 mg/dL (ref 0.3–1.2)
Total Protein: 6.9 g/dL (ref 6.5–8.1)

## 2018-05-03 LAB — CBC
HCT: 30.2 % — ABNORMAL LOW (ref 36.0–46.0)
Hemoglobin: 9.7 g/dL — ABNORMAL LOW (ref 12.0–15.0)
MCH: 26.1 pg (ref 26.0–34.0)
MCHC: 32.1 g/dL (ref 30.0–36.0)
MCV: 81.2 fL (ref 80.0–100.0)
Platelets: 166 10*3/uL (ref 150–400)
RBC: 3.72 MIL/uL — ABNORMAL LOW (ref 3.87–5.11)
RDW: 16.8 % — ABNORMAL HIGH (ref 11.5–15.5)
WBC: 6.8 10*3/uL (ref 4.0–10.5)
nRBC: 0 % (ref 0.0–0.2)

## 2018-05-03 LAB — GLUCOSE, CAPILLARY
Glucose-Capillary: 146 mg/dL — ABNORMAL HIGH (ref 70–99)
Glucose-Capillary: 90 mg/dL (ref 70–99)
Glucose-Capillary: 114 mg/dL — ABNORMAL HIGH (ref 70–99)
Glucose-Capillary: 142 mg/dL — ABNORMAL HIGH (ref 70–99)

## 2018-05-03 LAB — HEMOGLOBIN A1C
Hgb A1c MFr Bld: 5.9 % — ABNORMAL HIGH (ref 4.8–5.6)
Mean Plasma Glucose: 122.63 mg/dL

## 2018-05-03 LAB — ECHOCARDIOGRAM COMPLETE
Height: 67 in
Weight: 2987.2 oz

## 2018-05-03 LAB — HEPARIN LEVEL (UNFRACTIONATED)
Heparin Unfractionated: 0.69 IU/mL (ref 0.30–0.70)
Heparin Unfractionated: 0.69 IU/mL (ref 0.30–0.70)

## 2018-05-03 LAB — PREALBUMIN: Prealbumin: 16.7 mg/dL — ABNORMAL LOW (ref 18–38)

## 2018-05-03 LAB — APTT: aPTT: 120 seconds — ABNORMAL HIGH (ref 24–36)

## 2018-05-03 SURGERY — ECHOCARDIOGRAM, TRANSESOPHAGEAL
Anesthesia: Moderate Sedation

## 2018-05-03 MED ORDER — MIDAZOLAM HCL (PF) 5 MG/ML IJ SOLN
INTRAMUSCULAR | Status: AC
  Filled 2018-05-03: qty 2

## 2018-05-03 MED ORDER — FENTANYL CITRATE (PF) 100 MCG/2ML IJ SOLN
INTRAMUSCULAR | Status: DC | PRN
  Administered 2018-05-03: 25 ug via INTRAVENOUS

## 2018-05-03 MED ORDER — BUTAMBEN-TETRACAINE-BENZOCAINE 2-2-14 % EX AERO
INHALATION_SPRAY | CUTANEOUS | Status: DC | PRN
  Administered 2018-05-03: 2 via TOPICAL

## 2018-05-03 MED ORDER — FENTANYL CITRATE (PF) 100 MCG/2ML IJ SOLN
INTRAMUSCULAR | Status: AC
  Filled 2018-05-03: qty 2

## 2018-05-03 MED ORDER — SODIUM CHLORIDE 0.9 % IV SOLN: INTRAVENOUS | Status: DC

## 2018-05-03 MED ORDER — MIDAZOLAM HCL (PF) 10 MG/2ML IJ SOLN
INTRAMUSCULAR | Status: DC | PRN
  Administered 2018-05-03: 1 mg via INTRAVENOUS
  Administered 2018-05-03: 2 mg via INTRAVENOUS

## 2018-05-03 NOTE — Progress Notes (Signed)
[place] for Heparin Indication: atrial fibrillation  Allergies  Allergen Reactions  . Shellfish Allergy Anaphylaxis    Patient Measurements: Height: 5\' 7"  (170.2 cm) Weight: 182 lb (82.6 kg) IBW/kg (Calculated) : 61.6 Heparin Dosing Weight: 78kg  Vital Signs: Temp: 98.1 F (36.7 C) (12/05 2012) Temp Source: Oral (12/05 2012) BP: 116/53 (12/05 2012) Pulse Rate: 62 (12/05 2012)  Labs: Recent Labs    05/03/18 0226  HGB 9.7*  HCT 30.2*  PLT 166  HEPARINUNFRC 0.69  CREATININE 1.68*    Estimated Creatinine Clearance: 32.5 mL/min (A) (by C-G formula based on SCr of 1.68 mg/dL (H)).   Medical History: Past Medical History:  Diagnosis Date  . Anxiety   . Arthritis    back  . Atrial fibrillation ([place])   . Breast cancer ([place])    right breast cancer - 3 years ago  . Chronic diastolic heart failure (HCC)    ECHO: 06/20/2017 - Grade 2 Diastolic Dysfunction   . Coronary artery disease   . Depression   . Diabetes mellitus without complication ([facility])    diet  . Dysrhythmia    afib  . Fibromyalgia   . Herniated disc   . Hyperlipemia   . Hypertension   . Iron deficiency anemia   . Lower back pain   . Lumbar stenosis    L4-5  . S/P radiation therapy 01/23/2013-03/06/2013   Right breast / 46 Gy in 23 fractions / Right breast boost / 14 Gy in 7 fractions  . Severe mitral valve regurgitation   . Spondylolysis   . 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 Status post chemotherapy 11/04/2012 - 01/06/2013.   Docetaxel/Cytoxan Q 3 Weeks x 4 cycles  . Torn rotator cuff      Assessment: 89 yoM with hx of AFib on dabigatran PTA (last dose 12/3) admitted for heart catheterization and found to have severe MVR. Pt to start on IV heparin bridge while awaiting TCTS consult. Femoral sheath removed ~0900 per cath log.  12/6 AM update: initial heparin level therapeutic, CVTS work-up ongoing  Goal of Therapy:   Heparin level 0.3-0.7 units/ml Monitor platelets by anticoagulation protocol: Yes   Plan:  Cont heparin at 1100 units/hr Confirmatory heparin level at [place], PharmD, [place] Pharmacist Phone: [phone]

## 2018-05-03 NOTE — CV Procedure (Signed)
INDICATIONS:   The patient is 77 year old female with severe MR.  PROCEDURE:  Informed consent was discussed including risks, benefits and alternatives for the procedure.  Risks include, but are not limited to, cough, sore throat, vomiting, nausea, somnolence, esophageal and stomach trauma or perforation, bleeding, low blood pressure, aspiration, pneumonia, infection, trauma to the teeth and death.    Patient was given sedation.  The oropharynx was anesthetized with topical lidocaine.  The transesophageal probe was inserted in the esophagus and stomach and multiple views were obtained.  Agitated saline was used after the transesophageal probe was removed from the body.  The patient was kept under observation until the patient left the procedure room.  The patient left the procedure room in stable condition.   COMPLICATIONS:  There were no immediate complications.  FINDINGS:  1. LEFT VENTRICLE: The left ventricle is normal in structure and function.  Wall motion is normal.  No thrombus or masses seen in the left ventricle.  2. RIGHT VENTRICLE:  The right ventricle is normal in structure and function without any thrombus or masses.    3. LEFT ATRIUM:  The left atrium is moderate to severely dilated without any thrombus or masses.  4. LEFT ATRIAL APPENDAGE:  The left atrial appendage is free of any thrombus or masses.  5. RIGHT ATRIUM:  The right atrium is mildly dilated and free of any thrombus or masses.    6. ATRIAL SEPTUM:  The atrial septum appeared normal. 7.  8. MITRAL VALVE:  The mitral valve is thickened, calcific with mitral annular calcification and has severe regurgitation involving mostly P2 segment and rim of anterior leaflet giving wide central and posterior jet. No definite vegetations.  9. TRICUSPID VALVE:  The tricuspid valve is normal in structure and function with mild regurgitation, no  masses, stenosis or vegetations.  10. AORTIC VALVE:  The aortic valve is calcific and  thick with mild central jet of regurgitation, no masses, significant stenosis or vegetations.  11. PULMONIC VALVE:  The pulmonic valve is normal in structure and function with mild regurgitation, no masses, stenosis or vegetations.  12. AORTIC ARCH, ASCENDING AND DESCENDING AORTA:  The aorta had mild diffuse  atherosclerosis in the ascending and descending aorta.  The aortic arch also had focal atherosclerosis.  13.  Superior Vena Cava : No thrombus or catheter.  14.  Pulmonary Veins: Visible.  15.  Pulmonary artery: visible and normal.  16. Very small pericardial effusion around the heart.   IMPRESSION:   1. Severe MR ( mostly P2 segment) 2. Mild TR, AI and PI. 3. Normal LV and RV systolic function. 4. Dilated LA.[name] Kitchen 5.Very small pericardial effusion.  RECOMMENDATIONS:    CVTS consult for MV repair/replacement. Diuresis as tolerated. Dr. Terrence Dupont notified.[name] Kitchen

## 2018-05-03 NOTE — Plan of Care (Signed)

## 2018-05-03 NOTE — Progress Notes (Signed)
[place] for Heparin Indication: atrial fibrillation  Allergies  Allergen Reactions  . Shellfish Allergy Anaphylaxis    Patient Measurements: Height: 5\' 7"  (170.2 cm) Weight: 186 lb 11.2 oz (84.7 kg) IBW/kg (Calculated) : 61.6 Heparin Dosing Weight: 78kg  Vital Signs: Temp: 97.6 F (36.4 C) (12/06 0543) Temp Source: Oral (12/06 0543) BP: 97/61 (12/06 0543) Pulse Rate: 76 (12/06 0543)  Labs: Recent Labs    05/03/18 0226 05/03/18 1122  HGB 9.7*  --   HCT 30.2*  --   PLT 166  --   APTT 120*  --   LABPROT 17.3*  --   INR 1.43  --   HEPARINUNFRC 0.69 0.69  CREATININE 1.68*  --     Estimated Creatinine Clearance: 32.8 mL/min (A) (by C-G formula based on SCr of 1.68 mg/dL (H)).  Assessment: 82 yoM with hx of AFib on dabigatran PTA (last dose 12/3) admitted for heart catheterization and found to have severe MVR. Pt to start on IV heparin bridge while TCTS work-up ongoing.  Heparin level therapeutic (0.69) on gtt at 1100 units/hr. Hgb 9.7, plt 166. No bleeding noted.  Goal of Therapy:  Heparin level 0.3-0.7 units/ml Monitor platelets by anticoagulation protocol: Yes   Plan:  Continue heparin at 1100 units/hr Daily heparin level and CBC  Will f/u TCTS consult and surgery vs resuming DOAC  Sherlon Handing, PharmD, BCPS Clinical pharmacist 05/03/2018 12:43 PM  **Pharmacist phone directory can now be found on [email or web address] ([email or web address]).  Listed under [place].

## 2018-05-03 NOTE — Progress Notes (Signed)
  Echocardiogram 2D Echocardiogram has been performed.  Darlina Sicilian M 05/03/2018, 11:11 AM

## 2018-05-03 NOTE — Progress Notes (Signed)
Ref: Lajean Manes, MD   Subjective:  Patient with h/o severe MR. No chest pain. Here for TEE for further evaluation  Objective:  Vital Signs in the last 24 hours: Temp:  [97.5 F (36.4 C)-98.1 F (36.7 C)] 97.6 F (36.4 C) (12/06 0543) Pulse Rate:  [62-94] 76 (12/06 0543) Cardiac Rhythm: Atrial fibrillation (12/06 0705) Resp:  [23-27] 27 (12/05 1300) BP: (97-170)/(53-76) 97/61 (12/06 0543) SpO2:  [91 %-100 %] 95 % (12/06 0804) FiO2 (%):  [36 %] 36 % (12/05 1225) Weight:  [84.7 kg] 84.7 kg (12/06 0543)  Physical Exam: BP Readings from Last 1 Encounters:  05/03/18 97/61     Wt Readings from Last 1 Encounters:  05/03/18 84.7 kg    Weight change: 2.132 kg Body mass index is 29.24 kg/m. HEENT: [place]/AT, Eyes-Brown, PERL, EOMI, Conjunctiva-Pale pink, Sclera-Non-icteric Neck: No JVD, No bruit, Trachea midline. Lungs:  Clear, Bilateral. Cardiac:  Regular rhythm, normal S1 and S2, no S3. IV/VI systolic murmur LSB. II/VI systolic murmur at apex. Abdomen:  Soft, non-tender. BS present. Extremities:  No edema present. No cyanosis. No clubbing. CNS: AxOx3, Cranial nerves grossly intact, moves all 4 extremities.  Skin: Warm and dry.   Intake/Output from previous day: 12/05 0701 - 12/06 0700 In: 320 [P.O.:240; I.V.:80] Out: 500 [Urine:500]    Lab Results: BMET    Component Value Date/Time   NA 139 05/03/2018 0226   NA 138 02/06/2018 1424   NA 136 07/08/2017 0254   NA 138 01/11/2015 1054   NA 138 01/15/2014 1336   NA 142 06/05/2013 1052   K 3.8 05/03/2018 0226   K 3.8 02/06/2018 1424   K 4.0 07/08/2017 0254   K 3.4 (L) 01/11/2015 1054   K 3.4 (L) 01/15/2014 1336   K 3.1 (L) 06/05/2013 1052   CL 106 05/03/2018 0226   CL 104 02/06/2018 1424   CL 107 07/08/2017 0254   CL 105 11/04/2012 0937   CL 104 10/30/2012 1020   CL 104 09/11/2012 1207   CO2 20 (L) 05/03/2018 0226   CO2 25 02/06/2018 1424   CO2 19 (L) 07/08/2017 0254   CO2 23 01/11/2015 1054   CO2 26 01/15/2014  1336   CO2 29 06/05/2013 1052   GLUCOSE 173 (H) 05/03/2018 0226   GLUCOSE 126 (H) 02/06/2018 1424   GLUCOSE 106 (H) 07/08/2017 0254   GLUCOSE 99 01/11/2015 1054   GLUCOSE 109 01/15/2014 1336   GLUCOSE 95 06/05/2013 1052   GLUCOSE 152 (H) 11/04/2012 0937   GLUCOSE 91 10/30/2012 1020   GLUCOSE 73 09/11/2012 1207   BUN 25 (H) 05/03/2018 0226   BUN 20 02/06/2018 1424   BUN 19 07/08/2017 0254   BUN 18.4 01/11/2015 1054   BUN 21.0 01/15/2014 1336   BUN 22.2 06/05/2013 1052   CREATININE 1.68 (H) 05/03/2018 0226   CREATININE 1.65 (H) 02/06/2018 1424   CREATININE 1.48 (H) 07/08/2017 0254   CREATININE 1.4 (H) 01/11/2015 1054   CREATININE 1.5 (H) 01/15/2014 1336   CREATININE 1.4 (H) 06/05/2013 1052   CALCIUM 8.7 (L) 05/03/2018 0226   CALCIUM 8.9 02/06/2018 1424   CALCIUM 8.2 (L) 07/08/2017 0254   CALCIUM 8.8 01/11/2015 1054   CALCIUM 9.2 01/15/2014 1336   CALCIUM 9.4 06/05/2013 1052   GFRNONAA 30 (L) 05/03/2018 0226   GFRNONAA 34 (L) 07/08/2017 0254   GFRNONAA 36 (L) 07/07/2017 0238   GFRAA 34 (L) 05/03/2018 0226   GFRAA 39 (L) 07/08/2017 0254   GFRAA 41 (L) 07/07/2017  0238   CBC    Component Value Date/Time   WBC 6.8 05/03/2018 0226   RBC 3.72 (L) 05/03/2018 0226   HGB 9.7 (L) 05/03/2018 0226   HGB 10.6 (L) 01/11/2015 1054   HCT 30.2 (L) 05/03/2018 0226   HCT 33.1 (L) 01/11/2015 1054   PLT 166 05/03/2018 0226   PLT 207 01/11/2015 1054   MCV 81.2 05/03/2018 0226   MCV 84.1 01/11/2015 1054   MCH 26.1 05/03/2018 0226   MCHC 32.1 05/03/2018 0226   RDW 16.8 (H) 05/03/2018 0226   RDW 16.0 (H) 01/11/2015 1054   LYMPHSABS 1.6 01/04/2018 1536   LYMPHSABS 1.4 01/11/2015 1054   MONOABS 0.6 01/04/2018 1536   MONOABS 0.5 01/11/2015 1054   EOSABS 0.1 01/04/2018 1536   EOSABS 0.1 01/11/2015 1054   BASOSABS 0.1 01/04/2018 1536   BASOSABS 0.1 01/11/2015 1054   HEPATIC Function Panel Recent Labs    07/02/17 1729 05/03/18 0226  PROT 6.7 6.9   HEMOGLOBIN A1C No components  found for: HGA1C,  MPG CARDIAC ENZYMES Lab Results  Component Value Date   TROPONINI <0.03 07/05/2017   TROPONINI <0.03 06/20/2017   TROPONINI <0.03 06/20/2017   TROPONINI <0.03 06/20/2017   BNP Recent Labs    02/06/18 1424  PROBNP 5,893*   TSH Recent Labs    06/20/17 0039  TSH 1.495   CHOLESTEROL Recent Labs    06/20/17 0324 05/03/18 0226  CHOL 133 107    Scheduled Meds: . aspirin EC  81 mg Oral Daily  . atorvastatin  20 mg Oral Daily  . diltiazem  180 mg Oral Daily  . FLUoxetine  10 mg Oral Daily  . insulin aspart  0-9 Units Subcutaneous TID WC  . levalbuterol  1.25 mg Nebulization TID  . sodium chloride flush  3 mL Intravenous Q12H  . traZODone  50 mg Oral QHS   Continuous Infusions: . sodium chloride    . heparin 1,100 Units/hr (05/02/18 1704)   PRN Meds:.sodium chloride, acetaminophen, albuterol, levalbuterol, nitroGLYCERIN, ondansetron (ZOFRAN) IV, sodium chloride flush  Assessment/Plan: Mitral regurgitation Pulmonary HTN Non-obstructive CAD CKD, 2 HTN Anemia of chronic disease Type 2 DM Hyperlipidemia  TEE today.  Patient understood procedure and risks.   LOS: 1 day    Dixie Dials  MD  05/03/2018, 10:01 AM

## 2018-05-03 NOTE — Progress Notes (Signed)
CARDIAC REHAB PHASE I   Offered to walk with pt. Pt declining stating her "breathing is still not right". Encouraged pt to get out of bed as able today. No plan yet, for TEE later today. Will follow-up tomorrow.  [id number] Rufina Falco, RN BSN 05/03/2018 9:39 AM

## 2018-05-03 NOTE — Progress Notes (Signed)
[address]       [place] [address]             [phone]     CARDIOTHORACIC SURGERY PROGRESS NOTE  1 Day Post-Op  S/P Procedure(s) (LRB): RIGHT/LEFT HEART CATH AND CORONARY ANGIOGRAPHY (N/A)  Subjective: Reports breathing has improved with diuretic therapy  Objective: Vital signs in last 24 hours: Temp:  [97.6 F (36.4 C)-98.1 F (36.7 C)] 97.6 F (36.4 C) (12/06 0543) Pulse Rate:  [62-76] 76 (12/06 0543) Cardiac Rhythm: Atrial fibrillation (12/06 0705) BP: (97-116)/(53-61) 97/61 (12/06 0543) SpO2:  [95 %-100 %] 95 % (12/06 0804) Weight:  [84.7 kg] 84.7 kg (12/06 0543)  Physical Exam:  Rhythm:   Afib  Breath sounds: Diminished at bases  Heart sounds:  Irregular w/ systolic murmur  Incisions:  n/a  Abdomen:  soft  Extremities:  warm   Intake/Output from previous day: 12/05 0701 - 12/06 0700 In: 320 [P.O.:240; I.V.:80] Out: 500 [Urine:500] Intake/Output this shift: No intake/output data recorded.  Lab Results: Recent Labs    05/03/18 0226  WBC 6.8  HGB 9.7*  HCT 30.2*  PLT 166   BMET:  Recent Labs    05/03/18 0226  NA 139  K 3.8  CL 106  CO2 20*  GLUCOSE 173*  BUN 25*  CREATININE 1.68*  CALCIUM 8.7*    CBG (last 3)  Recent Labs    05/02/18 2143 05/03/18 0745 05/03/18 1122  GLUCAP 234* 146* 90   PT/INR:   Recent Labs    05/03/18 0226  LABPROT 17.3*  INR 1.43    CXR:  CHEST - 2 VIEW  COMPARISON:  02/06/2018  FINDINGS: Cardiac shadow remains enlarged. Aortic calcifications are again seen. The lungs are well aerated bilaterally. Mild interstitial changes are again seen and stable from the prior exam likely of a chronic nature. No focal confluent infiltrate or sizable effusion is noted. No bony abnormality is noted.  IMPRESSION: Stable and likely chronic interstitial prominence. No new focal abnormality is seen.   Electronically Signed   By: Inez Catalina M.D.   On: 05/03/2018  07:51   ORTHOPANTOGRAM/PANORAMIC  COMPARISON:  None.  FINDINGS: Panoramic view of the mandible reveals no acute fracture. Multiple dental hardware is identified. Some mild periapical lucencies are noted surrounding the molars in the mandible bilaterally. No definitive periapical abscess is seen. No other focal abnormality is noted.  IMPRESSION: Mild periapical lucencies without definitive abscess. Diffuse dental hardware is identified.   Electronically Signed   By: Inez Catalina M.D.   On: 05/03/2018 07:50   Transthoracic Echocardiography  Patient:    [place], [place] D MR #:       [id number] Study Date: 05/03/2018 Gender:     F Age:        77 Height:     170.2 cm Weight:     84.7 kg BSA:        2.02 m^2 Pt. Status: Room:       6E07C   ADMITTING    [name], MD  ATTENDING    [name], MD  ORDERING     [name], MD  PERFORMING   [name], MD  REFERRING    [name], MD  SONOGRAPHER  Darlina Sicilian, RDCS  cc:  ------------------------------------------------------------------- LV EF: 55% -   60%  ------------------------------------------------------------------- Indications:      Mitral regurgitation 424.0.  ------------------------------------------------------------------- History:   PMH:  Anemia.  Atrial fibrillation.  Coronary artery disease.  Congestive heart failure.  PMH:  Breast Cancer.  Risk factors:  Diabetes mellitus. Dyslipidemia.  ------------------------------------------------------------------- Study Conclusions  - Left ventricle: The cavity size was mildly dilated. There was   mild concentric hypertrophy. Systolic function was normal. The   estimated ejection fraction was in the range of 55% to 60%. Wall   motion was normal; there were no regional wall motion   abnormalities. Doppler parameters are consistent with abnormal   left ventricular relaxation (grade 1 diastolic dysfunction). - Ventricular  septum: Septal motion showed abnormal function and   dyssynergy. - Aortic valve: There was mild to moderate regurgitation. Valve   area (Vmax): 2.37 cm^2. - Mitral valve: There was severe regurgitation directed   posteriorly. - Left atrium: The atrium was moderately dilated. - Pulmonary arteries: Systolic pressure was moderately increased.   PA peak pressure: 33 mm Hg (S). - Pericardium, extracardiac: A trivial pericardial effusion was   identified.  ------------------------------------------------------------------- Study data:  Comparison was made to the study of 06/20/2017.  Study status:  Routine.  Procedure:  The patient reported no pain pre or post test. Transthoracic echocardiography. Image quality was adequate.  Study completion:  There were no complications. Transthoracic echocardiography.  M-mode, complete 2D, spectral Doppler, and color Doppler.  Birthdate:  Patient birthdate: Apr 27, 1941.  Age:  Patient is 77 yr old.  Sex:  Gender: female. BMI: 29.2 kg/m^2.  Blood pressure:     97/61  Patient status: Inpatient.  Study date:  Study date: 05/03/2018. Study time: 10:13 AM.  Location:  Bedside.  -------------------------------------------------------------------  ------------------------------------------------------------------- Left ventricle:  The cavity size was mildly dilated. There was mild concentric hypertrophy. Systolic function was normal. The estimated ejection fraction was in the range of 55% to 60%. Wall motion was normal; there were no regional wall motion abnormalities. Doppler parameters are consistent with abnormal left ventricular relaxation (grade 1 diastolic dysfunction).  ------------------------------------------------------------------- Aortic valve:   Trileaflet; mildly thickened leaflets.  Doppler: There was mild to moderate regurgitation.    Peak velocity ratio of LVOT to aortic valve: 0.93. Valve area (Vmax): 2.37 cm^2. Indexed valve area  (Vmax): 1.17 cm^2/m^2.    Peak gradient (S): 22 mm Hg.   ------------------------------------------------------------------- Mitral valve:   Moderately thickened leaflets .  Doppler:  There was severe regurgitation directed posteriorly.    Valve area by pressure half-time: 3.67 cm^2. Indexed valve area by pressure half-time: 1.81 cm^2/m^2.    Peak gradient (D): 12 mm Hg.  ------------------------------------------------------------------- Left atrium:  The atrium was moderately dilated.  ------------------------------------------------------------------- Right ventricle:  The cavity size was normal. Wall thickness was normal. Systolic function was normal.  ------------------------------------------------------------------- Ventricular septum:   Septal motion showed abnormal function and dyssynergy.  ------------------------------------------------------------------- Pulmonic valve:    Structurally normal valve.   Cusp separation was normal.  Doppler:  Transvalvular velocity was within the normal range. There was no regurgitation.  ------------------------------------------------------------------- Tricuspid valve:   Structurally normal valve.   Leaflet separation was normal.  Doppler:  Transvalvular velocity was within the normal range. There was mild regurgitation.  ------------------------------------------------------------------- Pulmonary artery:   Systolic pressure was moderately increased.  ------------------------------------------------------------------- Right atrium:  The atrium was normal in size.  ------------------------------------------------------------------- Pericardium:  A trivial pericardial effusion was identified.  ------------------------------------------------------------------- Systemic veins: Inferior vena cava: The vessel was normal in size. The respirophasic diameter changes were in the normal range (>= 50%), consistent with normal  central venous pressure.  ------------------------------------------------------------------- Measurements   Left ventricle  Value          Reference  LV ID, ED, PLAX chordal          (H)     56    mm       43 - 52  LV ID, ES, PLAX chordal                  38    mm       23 - 38  LV fx shortening, PLAX chordal           32    %        >=29  LV PW thickness, ED                      10    mm       ----------  IVS/LV PW ratio, ED                      1.3            <=1.3  Stroke volume, 2D                        50    ml       ----------  Stroke volume/bsa, 2D                    25    ml/m^2   ----------  LV e&', lateral                           12.9  cm/s     ----------  LV E/e&', lateral                         13.33          ----------  LV e&', medial                            7.18  cm/s     ----------  LV E/e&', medial                          23.96          ----------  LV e&', average                           10.04 cm/s     ----------  LV E/e&', average                         17.13          ----------    Ventricular septum                       Value          Reference  IVS thickness, ED                        13    mm       ----------    LVOT  Value          Reference  LVOT ID, S                               18    mm       ----------  LVOT area                                2.54  cm^2     ----------  LVOT peak velocity, S                    219   cm/s     ----------  LVOT mean velocity, S                    76    cm/s     ----------  LVOT VTI, S                              19.7  cm       ----------  LVOT peak gradient, S                    19    mm Hg    ----------    Aortic valve                             Value          Reference  Aortic valve peak velocity, S            235   cm/s     ----------  Aortic peak gradient, S                  22    mm Hg    ----------  Velocity ratio, peak, LVOT/AV            0.93            ----------  Aortic valve area, peak velocity         2.37  cm^2     ----------  Aortic valve area/bsa, peak              1.17  cm^2/m^2 ----------  velocity  Aortic regurg pressure half-time         434   ms       ----------    Aorta                                    Value          Reference  Aortic root ID, ED                       24    mm       ----------  Ascending aorta ID, A-P, S               28    mm       ----------    Left atrium                              Value  Reference  LA ID, A-P, ES                           52    mm       ----------  LA ID/bsa, A-P                   (H)     2.57  cm/m^2   <=2.2  LA volume, S                             103   ml       ----------  LA volume/bsa, S                         50.9  ml/m^2   ----------  LA volume, ES, 1-p A4C                   96.9  ml       ----------  LA volume/bsa, ES, 1-p A4C               47.9  ml/m^2   ----------  LA volume, ES, 1-p A2C                   111   ml       ----------  LA volume/bsa, ES, 1-p A2C               54.9  ml/m^2   ----------    Mitral valve                             Value          Reference  Mitral E-wave peak velocity              172   cm/s     ----------  Mitral deceleration time                 206   ms       150 - 230  Mitral pressure half-time                60    ms       ----------  Mitral peak gradient, D                  12    mm Hg    ----------  Mitral valve area, PHT, DP               3.67  cm^2     ----------  Mitral valve area/bsa, PHT, DP           1.81  cm^2/m^2 ----------  Mitral maximal regurg velocity,          506   cm/s     ----------  PISA  Mitral regurg VTI, PISA                  140   cm       ----------  Mitral ERO, PISA                         0.15  cm^2     ----------  Mitral regurg volume, PISA  21    ml       ----------    Pulmonary arteries                       Value          Reference  PA pressure, S, DP               (H)     33     mm Hg    <=30    Tricuspid valve                          Value          Reference  Tricuspid regurg peak velocity           273   cm/s     ----------  Tricuspid peak RV-RA gradient            30    mm Hg    ----------    Right atrium                             Value          Reference  RA ID, S-I, ES, A4C              (H)     65.1  mm       34 - 49  RA area, ES, A4C                 (H)     23.7  cm^2     8.3 - 19.5  RA volume, ES, A/L                       70.4  ml       ----------  RA volume/bsa, ES, A/L                   34.8  ml/m^2   ----------    Systemic veins                           Value          Reference  Estimated CVP                            3     mm Hg    ----------    Right ventricle                          Value          Reference  RV ID, ED, PLAX                          37    mm       19 - 38  RV pressure, S, DP               (H)     33    mm Hg    <=30  RV s&', lateral, S                        8.7   cm/s     ----------  Legend: (L)  and  (H)  mark values outside specified reference range.  ------------------------------------------------------------------- Prepared and Electronically Authenticated by  [name], MD 2019-12-06T11:59:21   Assessment/Plan: S/P Procedure(s) (LRB): RIGHT/LEFT HEART CATH AND CORONARY ANGIOGRAPHY (N/A)  I have personally reviewed the patient's transthoracic echocardiogram which demonstrates at least moderate and probably severe mitral regurgitation, although regurgitant volume was estimated only 21 mL and ERO only 0.15 cm2.  The patient has normal LV systolic function and mild-moderate central aortic insufficiency.  RV size and function appeared normal and there was only mild tricuspid regurgitation.  Await results of TEE scheduled for later today.  If TEE confirms the presence of severe mitral regurgitation it would be reasonable to consider elective mitral valve repair/replacement and maze procedure.    If the  patient remains in the hospital we could potentially plan surgery towards the end of next week (Thursday 12/12) if her renal function remains stable and she has been cleared for surgery by Dr. Lawana Chambers.  She needs dental service consult.  I will follow up again on Monday.   I spent in excess of 15 minutes during the conduct of this hospital encounter and >50% of this time involved direct face-to-face encounter with the patient for counseling and/or coordination of their care.    Rexene Alberts, MD 05/03/2018 2:25 PM

## 2018-05-03 NOTE — Progress Notes (Signed)
Subjective:  Appreciate CVTS consult by Dr. Roxy Manns. Patient denies any chest  pain or shortness of breath.   Denies any palpitations.  Noted to have occasionl PVCs and couplets  Asymptomatic. Will scheduled for TEE.  Objective:  Vital Signs in the last 24 hours: Temp:  [97.5 F (36.4 C)-98.1 F (36.7 C)] 97.6 F (36.4 C) (12/06 0543) Pulse Rate:  [0-94] 76 (12/06 0543) Resp:  [13-27] 27 (12/05 1300) BP: (97-170)/(53-76) 97/61 (12/06 0543) SpO2:  [0 %-100 %] 95 % (12/06 0804) FiO2 (%):  [36 %] 36 % (12/05 1225) Weight:  [84.7 kg] 84.7 kg (12/06 0543)  Intake/Output from previous day: 12/05 0701 - 12/06 0700 In: 320 [P.O.:240; I.V.:80] Out: 500 [Urine:500] Intake/Output from this shift: No intake/output data recorded.  Physical Exam: Neck: no adenopathy, no carotid bruit, no JVD, supple, symmetrical, trachea midline and thyroid not enlarged, symmetric, no tenderness/mass/nodules Lungs: clear to auscultation bilaterally Heart: irregularly irregular rhythm, S1, S2 normal and  2/6 systolic and soft diastolic murmur noted Abdomen: soft, non-tender; bowel sounds normal; no masses,  no organomegaly Extremities: extremities normal, atraumatic, no cyanosis or edema and right groin soft dressing  dry no hematoma  Lab Results: Recent Labs    05/03/18 0226  WBC 6.8  HGB 9.7*  PLT 166   Recent Labs    05/03/18 0226  NA 139  K 3.8  CL 106  CO2 20*  GLUCOSE 173*  BUN 25*  CREATININE 1.68*   No results for input(s): TROPONINI in the last 72 hours.  Invalid input(s): CK, MB Hepatic Function Panel Recent Labs    05/03/18 0226  PROT 6.9  ALBUMIN 3.2*  AST 18  ALT 16  ALKPHOS 76  BILITOT 0.8   Recent Labs    05/03/18 0226  CHOL 107   No results for input(s): PROTIME in the last 72 hours.  Imaging: Imaging results have been reviewed and Dg Orthopantogram  Result Date: 05/03/2018 CLINICAL DATA:  Poor dentition EXAM: ORTHOPANTOGRAM/PANORAMIC COMPARISON:  None.  FINDINGS: Panoramic view of the mandible reveals no acute fracture. Multiple dental hardware is identified. Some mild periapical lucencies are noted surrounding the molars in the mandible bilaterally. No definitive periapical abscess is seen. No other focal abnormality is noted. IMPRESSION: Mild periapical lucencies without definitive abscess. Diffuse dental hardware is identified. Electronically Signed   By: Inez Catalina M.D.   On: 05/03/2018 07:50   Dg Chest 2 View  Result Date: 05/03/2018 CLINICAL DATA:  Shortness of Breath EXAM: CHEST - 2 VIEW COMPARISON:  02/06/2018 FINDINGS: Cardiac shadow remains enlarged. Aortic calcifications are again seen. The lungs are well aerated bilaterally. Mild interstitial changes are again seen and stable from the prior exam likely of a chronic nature. No focal confluent infiltrate or sizable effusion is noted. No bony abnormality is noted. IMPRESSION: Stable and likely chronic interstitial prominence. No new focal abnormality is seen. Electronically Signed   By: Inez Catalina M.D.   On: 05/03/2018 07:51    Cardiac Studies:  Assessment/Plan:  Severe symptomatic mitral regurgitation. Pulmonary hypertension. 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 Hypertension. Diabetes mellitus. Hyperlipidemia. Osteoarthritis. Plan We will Schedule for TEE.  Monitor renal function. Hold metformin. We'll start ARB once renal function stabilizes Dr. Doylene Canard  on call for weekend  Check labs in the a.m.  LOS: 1 day    [name] 05/03/2018, 8:45 AM

## 2018-05-04 LAB — CBC
HCT: 29.8 % — ABNORMAL LOW (ref 36.0–46.0)
Hemoglobin: 9.5 g/dL — ABNORMAL LOW (ref 12.0–15.0)
MCH: 26 pg (ref 26.0–34.0)
MCHC: 31.9 g/dL (ref 30.0–36.0)
MCV: 81.4 fL (ref 80.0–100.0)
Platelets: 175 10*3/uL (ref 150–400)
RBC: 3.66 MIL/uL — ABNORMAL LOW (ref 3.87–5.11)
RDW: 17.1 % — ABNORMAL HIGH (ref 11.5–15.5)
WBC: 7.7 10*3/uL (ref 4.0–10.5)
nRBC: 0 % (ref 0.0–0.2)

## 2018-05-04 LAB — BASIC METABOLIC PANEL
Anion gap: 10 (ref 5–15)
BUN: 30 mg/dL — ABNORMAL HIGH (ref 8–23)
CO2: 23 mmol/L (ref 22–32)
Calcium: 8.7 mg/dL — ABNORMAL LOW (ref 8.9–10.3)
Chloride: 108 mmol/L (ref 98–111)
Creatinine, Ser: 1.8 mg/dL — ABNORMAL HIGH (ref 0.44–1.00)
GFR calc Af Amer: 32 mL/min — ABNORMAL LOW (ref >60)
GFR calc non Af Amer: 27 mL/min — ABNORMAL LOW (ref >60)
Glucose, Bld: 112 mg/dL — ABNORMAL HIGH (ref 70–99)
Potassium: 3.8 mmol/L (ref 3.5–5.1)
Sodium: 141 mmol/L (ref 135–145)

## 2018-05-04 LAB — GLUCOSE, CAPILLARY
Glucose-Capillary: 98 mg/dL (ref 70–99)
Glucose-Capillary: 136 mg/dL — ABNORMAL HIGH (ref 70–99)
Glucose-Capillary: 101 mg/dL — ABNORMAL HIGH (ref 70–99)
Glucose-Capillary: 118 mg/dL — ABNORMAL HIGH (ref 70–99)

## 2018-05-04 LAB — HEPARIN LEVEL (UNFRACTIONATED)
Heparin Unfractionated: 0.82 IU/mL — ABNORMAL HIGH (ref 0.30–0.70)
Heparin Unfractionated: 0.7 IU/mL (ref 0.30–0.70)

## 2018-05-04 NOTE — Progress Notes (Signed)
[place] for heparin Indication: atrial fibrillation  Allergies  Allergen Reactions  . Shellfish Allergy Anaphylaxis    Patient Measurements: Height: 5\' 7"  (170.2 cm) Weight: 187 lb 8 oz (85 kg) IBW/kg (Calculated) : 61.6 Heparin Dosing Weight: 78kg  Vital Signs: Temp: 97.4 F (36.3 C) (12/07 0519) Temp Source: Oral (12/07 0519) BP: 120/65 (12/07 0519) Pulse Rate: 63 (12/07 0519)  Labs: Recent Labs    05/03/18 0226 05/03/18 1122 05/04/18 0431  HGB 9.7*  --  9.5*  HCT 30.2*  --  29.8*  PLT 166  --  175  APTT 120*  --   --   LABPROT 17.3*  --   --   INR 1.43  --   --   HEPARINUNFRC 0.69 0.69 0.82*  CREATININE 1.68*  --  1.80*    Estimated Creatinine Clearance: 30.7 mL/min (A) (by C-G formula based on SCr of 1.8 mg/dL (H)).  Assessment: 77 yo F with hx of AFib on dabigatran PTA (last dose 12/3) admitted for heart catheterization and found to have severe MVR. Pt initiated on IV heparin bridge while TCTS work-up ongoing.  Heparin level supra-therapeutic (0.82) on heparin at 1100 units/hr. Hgb stable at 9.5, plt 175. No bleeding noted.  Noted SCr up 1.8.  Goal of Therapy:  Heparin level 0.3-0.7 units/ml Monitor platelets by anticoagulation protocol: Yes   Plan:  Reduce heparin to 950 units/hr Next heparin level in 6 hours Daily heparin level and CBC   Kimberly Hammons, Pharm.D., BCPS Clinical Pharmacist  **Pharmacist phone directory can now be found on [email or web address] ([email or web address]).  Listed under [place].  05/04/2018 8:19 AM

## 2018-05-04 NOTE — Progress Notes (Signed)
Ref: Lajean Manes, MD   Subjective:  Feeling better. Decreasing shortness of breath. TEE showed severe MR, calcific AV with mild AI, PI and TR.   Objective:  Vital Signs in the last 24 hours: Temp:  [97.4 F (36.3 C)-98 F (36.7 C)] 97.4 F (36.3 C) (12/07 0519) Pulse Rate:  [55-78] 63 (12/07 0519) Cardiac Rhythm: Atrial fibrillation (12/07 0915) Resp:  [14-28] 21 (12/06 1626) BP: (94-146)/(52-73) 120/65 (12/07 0519) SpO2:  [91 %-100 %] 91 % (12/07 0519) Weight:  [85 kg] 85 kg (12/07 0519)  Physical Exam: BP Readings from Last 1 Encounters:  05/04/18 120/65     Wt Readings from Last 1 Encounters:  05/04/18 85 kg    Weight change: 0.363 kg Body mass index is 29.37 kg/m. HEENT: [place]/AT, Eyes-Brown, PERL, EOMI, Conjunctiva-Pale pink, Sclera-Non-icteric Neck: No JVD, No bruit, Trachea midline. Lungs:  Clear, Bilateral. Cardiac:  Regular rhythm, normal S1 and S2, no S3. III/VI systolic murmur. Abdomen:  Soft, non-tender. BS present. Extremities:  No edema present. No cyanosis. No clubbing. CNS: AxOx3, Cranial nerves grossly intact, moves all 4 extremities.  Skin: Warm and dry.   Intake/Output from previous day: 12/06 0701 - 12/07 0700 In: 480 [P.O.:480] Out: -     Lab Results: BMET    Component Value Date/Time   NA 141 05/04/2018 0431   NA 139 05/03/2018 0226   NA 138 02/06/2018 1424   NA 138 01/11/2015 1054   NA 138 01/15/2014 1336   NA 142 06/05/2013 1052   K 3.8 05/04/2018 0431   K 3.8 05/03/2018 0226   K 3.8 02/06/2018 1424   K 3.4 (L) 01/11/2015 1054   K 3.4 (L) 01/15/2014 1336   K 3.1 (L) 06/05/2013 1052   CL 108 05/04/2018 0431   CL 106 05/03/2018 0226   CL 104 02/06/2018 1424   CL 105 11/04/2012 0937   CL 104 10/30/2012 1020   CL 104 09/11/2012 1207   CO2 23 05/04/2018 0431   CO2 20 (L) 05/03/2018 0226   CO2 25 02/06/2018 1424   CO2 23 01/11/2015 1054   CO2 26 01/15/2014 1336   CO2 29 06/05/2013 1052   GLUCOSE 112 (H) 05/04/2018 0431   GLUCOSE 173 (H) 05/03/2018 0226   GLUCOSE 126 (H) 02/06/2018 1424   GLUCOSE 99 01/11/2015 1054   GLUCOSE 109 01/15/2014 1336   GLUCOSE 95 06/05/2013 1052   GLUCOSE 152 (H) 11/04/2012 0937   GLUCOSE 91 10/30/2012 1020   GLUCOSE 73 09/11/2012 1207   BUN 30 (H) 05/04/2018 0431   BUN 25 (H) 05/03/2018 0226   BUN 20 02/06/2018 1424   BUN 18.4 01/11/2015 1054   BUN 21.0 01/15/2014 1336   BUN 22.2 06/05/2013 1052   CREATININE 1.80 (H) 05/04/2018 0431   CREATININE 1.68 (H) 05/03/2018 0226   CREATININE 1.65 (H) 02/06/2018 1424   CREATININE 1.4 (H) 01/11/2015 1054   CREATININE 1.5 (H) 01/15/2014 1336   CREATININE 1.4 (H) 06/05/2013 1052   CALCIUM 8.7 (L) 05/04/2018 0431   CALCIUM 8.7 (L) 05/03/2018 0226   CALCIUM 8.9 02/06/2018 1424   CALCIUM 8.8 01/11/2015 1054   CALCIUM 9.2 01/15/2014 1336   CALCIUM 9.4 06/05/2013 1052   GFRNONAA 27 (L) 05/04/2018 0431   GFRNONAA 30 (L) 05/03/2018 0226   GFRNONAA 34 (L) 07/08/2017 0254   GFRAA 32 (L) 05/04/2018 0431   GFRAA 34 (L) 05/03/2018 0226   GFRAA 39 (L) 07/08/2017 0254   CBC    Component Value Date/Time  WBC 7.7 05/04/2018 0431   RBC 3.66 (L) 05/04/2018 0431   HGB 9.5 (L) 05/04/2018 0431   HGB 10.6 (L) 01/11/2015 1054   HCT 29.8 (L) 05/04/2018 0431   HCT 33.1 (L) 01/11/2015 1054   PLT 175 05/04/2018 0431   PLT 207 01/11/2015 1054   MCV 81.4 05/04/2018 0431   MCV 84.1 01/11/2015 1054   MCH 26.0 05/04/2018 0431   MCHC 31.9 05/04/2018 0431   RDW 17.1 (H) 05/04/2018 0431   RDW 16.0 (H) 01/11/2015 1054   LYMPHSABS 1.6 01/04/2018 1536   LYMPHSABS 1.4 01/11/2015 1054   MONOABS 0.6 01/04/2018 1536   MONOABS 0.5 01/11/2015 1054   EOSABS 0.1 01/04/2018 1536   EOSABS 0.1 01/11/2015 1054   BASOSABS 0.1 01/04/2018 1536   BASOSABS 0.1 01/11/2015 1054   HEPATIC Function Panel Recent Labs    07/02/17 1729 05/03/18 0226  PROT 6.7 6.9   HEMOGLOBIN A1C No components found for: HGA1C,  MPG CARDIAC ENZYMES Lab Results  Component  Value Date   TROPONINI <0.03 07/05/2017   TROPONINI <0.03 06/20/2017   TROPONINI <0.03 06/20/2017   TROPONINI <0.03 06/20/2017   BNP Recent Labs    02/06/18 1424  PROBNP 5,893*   TSH Recent Labs    06/20/17 0039  TSH 1.495   CHOLESTEROL Recent Labs    06/20/17 0324 05/03/18 0226  CHOL 133 107    Scheduled Meds: . aspirin EC  81 mg Oral Daily  . atorvastatin  20 mg Oral Daily  . diltiazem  180 mg Oral Daily  . FLUoxetine  10 mg Oral Daily  . insulin aspart  0-9 Units Subcutaneous TID WC  . sodium chloride flush  3 mL Intravenous Q12H  . traZODone  50 mg Oral QHS   Continuous Infusions: . sodium chloride    . heparin 950 Units/hr (05/04/18 0913)   PRN Meds:.sodium chloride, acetaminophen, albuterol, levalbuterol, nitroGLYCERIN, ondansetron (ZOFRAN) IV, sodium chloride flush  Assessment/Plan: Severe MR Pulmonary hypertension Non-obstructive CAD CKD, 3 Anemia of chronic disease Type 2 DM Hyperlipidemia  Awaiting dental evaluation and appreciate CVTS consult.   LOS: 2 days    Dixie Dials  MD  05/04/2018, 10:43 AM

## 2018-05-04 NOTE — Progress Notes (Signed)
PHASE I CARDIAC REHAB   Pt eating lunch. Pt declined offer for ambulation. Pt also awaiting visit from her great -grandchildren.  Will return after lunch to reassess readiness for ambulation. Andi Hence, RN, BSN Cardiac Pulmonary Rehab

## 2018-05-04 NOTE — Progress Notes (Signed)
CARDIAC REHAB PHASE I   [id number] Pt refused ambulation, family at bedside. Pt reports she is ambulating on her own. Pt and family given pre op education.  Handouts given. Pt return demonstrates proper sternal precaution movements.  Pt and family verbalized understanding.  Andi Hence, RN, BSN Cardiac Pulmonary Rehab    [facility]

## 2018-05-04 NOTE — Progress Notes (Signed)
[place] for heparin Indication: atrial fibrillation  Allergies  Allergen Reactions  . Shellfish Allergy Anaphylaxis    Patient Measurements: Height: 5\' 7"  (170.2 cm) Weight: 187 lb 8 oz (85 kg) IBW/kg (Calculated) : 61.6 Heparin Dosing Weight: 78kg  Vital Signs: Temp: 97.6 F (36.4 C) (12/07 1406) Temp Source: Oral (12/07 1406) BP: 109/57 (12/07 1406) Pulse Rate: 53 (12/07 1406)  Labs: Recent Labs    05/03/18 0226 05/03/18 1122 05/04/18 0431 05/04/18 1434  HGB 9.7*  --  9.5*  --   HCT 30.2*  --  29.8*  --   PLT 166  --  175  --   APTT 120*  --   --   --   LABPROT 17.3*  --   --   --   INR 1.43  --   --   --   HEPARINUNFRC 0.69 0.69 0.82* 0.70  CREATININE 1.68*  --  1.80*  --     Estimated Creatinine Clearance: 30.7 mL/min (A) (by C-G formula based on SCr of 1.8 mg/dL (H)).  Assessment: 77 yo F with hx of AFib on dabigatran PTA (last dose 12/3) admitted for heart catheterization and found to have severe MVR. Pt initiated on IV heparin bridge while TCTS work-up ongoing.  Heparin level now therapeutic but at top of range at 0.7. Hgb stable at 9.5, plt 175. No bleeding or issues with infusion per discussion with RN.  Noted SCr up 1.8.  Goal of Therapy:  Heparin level 0.3-0.7 units/ml Monitor platelets by anticoagulation protocol: Yes   Plan:  Reduce heparin to 900 units/hr Confirmatory heparin level with AM labs Monitor daily heparin level and CBC, s/sx bleeding Pradaxa on hold  Elicia Lamp, PharmD, BCPS Clinical Pharmacist Clinical phone [phone] Please check AMION for all [place] contact numbers 05/04/2018 3:30 PM

## 2018-05-05 LAB — URINALYSIS, COMPLETE (UACMP) WITH MICROSCOPIC
Bilirubin Urine: NEGATIVE
Glucose, UA: NEGATIVE mg/dL
Hgb urine dipstick: NEGATIVE
Ketones, ur: NEGATIVE mg/dL
Nitrite: NEGATIVE
Protein, ur: NEGATIVE mg/dL
Specific Gravity, Urine: 1.017 (ref 1.005–1.030)
pH: 6 (ref 5.0–8.0)

## 2018-05-05 LAB — CBC
HCT: 29.7 % — ABNORMAL LOW (ref 36.0–46.0)
Hemoglobin: 9.5 g/dL — ABNORMAL LOW (ref 12.0–15.0)
MCH: 26 pg (ref 26.0–34.0)
MCHC: 32 g/dL (ref 30.0–36.0)
MCV: 81.1 fL (ref 80.0–100.0)
Platelets: 157 10*3/uL (ref 150–400)
RBC: 3.66 MIL/uL — ABNORMAL LOW (ref 3.87–5.11)
RDW: 16.9 % — ABNORMAL HIGH (ref 11.5–15.5)
WBC: 6.2 10*3/uL (ref 4.0–10.5)
nRBC: 0 % (ref 0.0–0.2)

## 2018-05-05 LAB — GLUCOSE, CAPILLARY
Glucose-Capillary: 89 mg/dL (ref 70–99)
Glucose-Capillary: 88 mg/dL (ref 70–99)
Glucose-Capillary: 132 mg/dL — ABNORMAL HIGH (ref 70–99)
Glucose-Capillary: 95 mg/dL (ref 70–99)

## 2018-05-05 LAB — HEPARIN LEVEL (UNFRACTIONATED): Heparin Unfractionated: 0.52 IU/mL (ref 0.30–0.70)

## 2018-05-05 NOTE — Plan of Care (Signed)
  Problem: Education: Goal: Knowledge of General Education information will improve Description: Including pain rating scale, medication(s)/side effects and non-pharmacologic comfort measures Outcome: Progressing   Problem: Clinical Measurements: Goal: Ability to maintain clinical measurements within normal limits will improve Outcome: Progressing Goal: Will remain free from infection Outcome: Progressing   

## 2018-05-05 NOTE — Progress Notes (Signed)
Ref: Lajean Manes, MD   Subjective:  Feeling better. No shortness of breath.   Objective:  Vital Signs in the last 24 hours: Temp:  [97.5 F (36.4 C)-98 F (36.7 C)] 97.5 F (36.4 C) (12/08 0422) Pulse Rate:  [53-74] 74 (12/08 0422) Cardiac Rhythm: Atrial fibrillation (12/08 0700) Resp:  [16-18] 18 (12/08 0422) BP: (109-118)/(57-88) 115/57 (12/08 0932) SpO2:  [96 %-100 %] 96 % (12/08 0422) Weight:  [86.2 kg] 86.2 kg (12/08 0422)  Physical Exam: BP Readings from Last 1 Encounters:  05/05/18 (!) 115/57     Wt Readings from Last 1 Encounters:  05/05/18 86.2 kg    Weight change: 1.179 kg Body mass index is 29.77 kg/m. HEENT: [place]/AT, Eyes-Brown, PERL, EOMI, Conjunctiva-Pale pink, Sclera-Non-icteric Neck: No JVD, No bruit, Trachea midline. Lungs:  Clear, Bilateral. Cardiac:  Regular rhythm, normal S1 and S2, no S3. III/VI systolic murmur. Abdomen:  Soft, non-tender. BS present. Extremities:  No edema present. No cyanosis. No clubbing. CNS: AxOx3, Cranial nerves grossly intact, moves all 4 extremities.  Skin: Warm and dry.   Intake/Output from previous day: No intake/output data recorded.    Lab Results: BMET    Component Value Date/Time   NA 141 05/04/2018 0431   NA 139 05/03/2018 0226   NA 138 02/06/2018 1424   NA 138 01/11/2015 1054   NA 138 01/15/2014 1336   NA 142 06/05/2013 1052   K 3.8 05/04/2018 0431   K 3.8 05/03/2018 0226   K 3.8 02/06/2018 1424   K 3.4 (L) 01/11/2015 1054   K 3.4 (L) 01/15/2014 1336   K 3.1 (L) 06/05/2013 1052   CL 108 05/04/2018 0431   CL 106 05/03/2018 0226   CL 104 02/06/2018 1424   CL 105 11/04/2012 0937   CL 104 10/30/2012 1020   CL 104 09/11/2012 1207   CO2 23 05/04/2018 0431   CO2 20 (L) 05/03/2018 0226   CO2 25 02/06/2018 1424   CO2 23 01/11/2015 1054   CO2 26 01/15/2014 1336   CO2 29 06/05/2013 1052   GLUCOSE 112 (H) 05/04/2018 0431   GLUCOSE 173 (H) 05/03/2018 0226   GLUCOSE 126 (H) 02/06/2018 1424   GLUCOSE 99  01/11/2015 1054   GLUCOSE 109 01/15/2014 1336   GLUCOSE 95 06/05/2013 1052   GLUCOSE 152 (H) 11/04/2012 0937   GLUCOSE 91 10/30/2012 1020   GLUCOSE 73 09/11/2012 1207   BUN 30 (H) 05/04/2018 0431   BUN 25 (H) 05/03/2018 0226   BUN 20 02/06/2018 1424   BUN 18.4 01/11/2015 1054   BUN 21.0 01/15/2014 1336   BUN 22.2 06/05/2013 1052   CREATININE 1.80 (H) 05/04/2018 0431   CREATININE 1.68 (H) 05/03/2018 0226   CREATININE 1.65 (H) 02/06/2018 1424   CREATININE 1.4 (H) 01/11/2015 1054   CREATININE 1.5 (H) 01/15/2014 1336   CREATININE 1.4 (H) 06/05/2013 1052   CALCIUM 8.7 (L) 05/04/2018 0431   CALCIUM 8.7 (L) 05/03/2018 0226   CALCIUM 8.9 02/06/2018 1424   CALCIUM 8.8 01/11/2015 1054   CALCIUM 9.2 01/15/2014 1336   CALCIUM 9.4 06/05/2013 1052   GFRNONAA 27 (L) 05/04/2018 0431   GFRNONAA 30 (L) 05/03/2018 0226   GFRNONAA 34 (L) 07/08/2017 0254   GFRAA 32 (L) 05/04/2018 0431   GFRAA 34 (L) 05/03/2018 0226   GFRAA 39 (L) 07/08/2017 0254   CBC    Component Value Date/Time   WBC 6.2 05/05/2018 0418   RBC 3.66 (L) 05/05/2018 0418   HGB 9.5 (L) 05/05/2018  0418   HGB 10.6 (L) 01/11/2015 1054   HCT 29.7 (L) 05/05/2018 0418   HCT 33.1 (L) 01/11/2015 1054   PLT 157 05/05/2018 0418   PLT 207 01/11/2015 1054   MCV 81.1 05/05/2018 0418   MCV 84.1 01/11/2015 1054   MCH 26.0 05/05/2018 0418   MCHC 32.0 05/05/2018 0418   RDW 16.9 (H) 05/05/2018 0418   RDW 16.0 (H) 01/11/2015 1054   LYMPHSABS 1.6 01/04/2018 1536   LYMPHSABS 1.4 01/11/2015 1054   MONOABS 0.6 01/04/2018 1536   MONOABS 0.5 01/11/2015 1054   EOSABS 0.1 01/04/2018 1536   EOSABS 0.1 01/11/2015 1054   BASOSABS 0.1 01/04/2018 1536   BASOSABS 0.1 01/11/2015 1054   HEPATIC Function Panel Recent Labs    07/02/17 1729 05/03/18 0226  PROT 6.7 6.9   HEMOGLOBIN A1C No components found for: HGA1C,  MPG CARDIAC ENZYMES Lab Results  Component Value Date   TROPONINI <0.03 07/05/2017   TROPONINI <0.03 06/20/2017   TROPONINI  <0.03 06/20/2017   TROPONINI <0.03 06/20/2017   BNP Recent Labs    02/06/18 1424  PROBNP 5,893*   TSH Recent Labs    06/20/17 0039  TSH 1.495   CHOLESTEROL Recent Labs    06/20/17 0324 05/03/18 0226  CHOL 133 107    Scheduled Meds: . aspirin EC  81 mg Oral Daily  . atorvastatin  20 mg Oral Daily  . diltiazem  180 mg Oral Daily  . FLUoxetine  10 mg Oral Daily  . insulin aspart  0-9 Units Subcutaneous TID WC  . sodium chloride flush  3 mL Intravenous Q12H  . traZODone  50 mg Oral QHS   Continuous Infusions: . sodium chloride    . heparin 900 Units/hr (05/04/18 1607)   PRN Meds:.sodium chloride, acetaminophen, albuterol, levalbuterol, nitroGLYCERIN, ondansetron (ZOFRAN) IV, sodium chloride flush  Assessment/Plan: Severe MR Pulmonary systolic hypertension Non-obstructive CAD CKD, 3 Anemia of chronic disease Type 2 DM Hyperlipidemia Atrial fibrillation, CHA2DS2VASc score of 4  Awaiting MV surgery with Maze procedure. Awaiting dental evaluation. Increase activity as tolerated.   LOS: 3 days    Dixie Dials  MD  05/05/2018, 12:45 PM

## 2018-05-05 NOTE — Progress Notes (Signed)
[place] for heparin Indication: atrial fibrillation  Allergies  Allergen Reactions  . Shellfish Allergy Anaphylaxis    Patient Measurements: Height: 5\' 7"  (170.2 cm) Weight: 190 lb 1.6 oz (86.2 kg) IBW/kg (Calculated) : 61.6 Heparin Dosing Weight: 78kg  Vital Signs: Temp: 97.5 F (36.4 C) (12/08 0422) Temp Source: Oral (12/08 0422) BP: 115/57 (12/08 0932) Pulse Rate: 74 (12/08 0422)  Labs: Recent Labs    05/03/18 0226  05/04/18 0431 05/04/18 1434 05/05/18 0418  HGB 9.7*  --  9.5*  --  9.5*  HCT 30.2*  --  29.8*  --  29.7*  PLT 166  --  175  --  157  APTT 120*  --   --   --   --   LABPROT 17.3*  --   --   --   --   INR 1.43  --   --   --   --   HEPARINUNFRC 0.69   < > 0.82* 0.70 0.52  CREATININE 1.68*  --  1.80*  --   --    < > = values in this interval not displayed.    Estimated Creatinine Clearance: 30.9 mL/min (A) (by C-G formula based on SCr of 1.8 mg/dL (H)).  Assessment: 77 yo F with hx of AFib on dabigatran PTA (last dose 12/3) admitted for heart catheterization and found to have severe MVR. Pt initiated on IV heparin bridge while TCTS work-up ongoing.  Heparin level therapeutic. CBC stable. No bleeding issues documented.  Goal of Therapy:  Heparin level 0.3-0.7 units/ml Monitor platelets by anticoagulation protocol: Yes   Plan:  Continue heparin at 900 units/hr Monitor daily heparin level and CBC, s/sx bleeding Pradaxa on hold  Elicia Lamp, PharmD, BCPS Clinical Pharmacist Clinical phone [phone] Please check AMION for all [place] contact numbers 05/05/2018 10:54 AM

## 2018-05-06 ENCOUNTER — Encounter (HOSPITAL_COMMUNITY): Payer: Self-pay | Admitting: Cardiovascular Disease

## 2018-05-06 ENCOUNTER — Inpatient Hospital Stay (HOSPITAL_COMMUNITY): Payer: Medicare Other

## 2018-05-06 DIAGNOSIS — I340 Nonrheumatic mitral (valve) insufficiency: Secondary | ICD-10-CM | POA: Diagnosis not present

## 2018-05-06 LAB — HEPARIN LEVEL (UNFRACTIONATED): Heparin Unfractionated: 0.47 IU/mL (ref 0.30–0.70)

## 2018-05-06 LAB — CBC
HCT: 29.5 % — ABNORMAL LOW (ref 36.0–46.0)
Hemoglobin: 9.5 g/dL — ABNORMAL LOW (ref 12.0–15.0)
MCH: 26 pg (ref 26.0–34.0)
MCHC: 32.2 g/dL (ref 30.0–36.0)
MCV: 80.8 fL (ref 80.0–100.0)
Platelets: 145 10*3/uL — ABNORMAL LOW (ref 150–400)
RBC: 3.65 MIL/uL — ABNORMAL LOW (ref 3.87–5.11)
RDW: 16.9 % — ABNORMAL HIGH (ref 11.5–15.5)
WBC: 5.6 10*3/uL (ref 4.0–10.5)
nRBC: 0 % (ref 0.0–0.2)

## 2018-05-06 LAB — CREATININE, SERUM
Creatinine, Ser: 1.63 mg/dL — ABNORMAL HIGH (ref 0.44–1.00)
GFR calc Af Amer: 36 mL/min — ABNORMAL LOW (ref >60)
GFR calc non Af Amer: 31 mL/min — ABNORMAL LOW (ref >60)

## 2018-05-06 LAB — GLUCOSE, CAPILLARY
Glucose-Capillary: 96 mg/dL (ref 70–99)
Glucose-Capillary: 75 mg/dL (ref 70–99)
Glucose-Capillary: 101 mg/dL — ABNORMAL HIGH (ref 70–99)
Glucose-Capillary: 114 mg/dL — ABNORMAL HIGH (ref 70–99)

## 2018-05-06 LAB — BUN: BUN: 28 mg/dL — ABNORMAL HIGH (ref 8–23)

## 2018-05-06 MED ORDER — IOPAMIDOL (ISOVUE-370) INJECTION 76%
100.0000 mL | Freq: Once | INTRAVENOUS | Status: AC | PRN
  Administered 2018-05-06: 100 mL via INTRAVENOUS

## 2018-05-06 MED ORDER — CHLORHEXIDINE GLUCONATE 0.12 % MT SOLN
15.0000 mL | Freq: Two times a day (BID) | OROMUCOSAL | Status: DC
  Administered 2018-05-06 – 2018-05-08 (×6): 15 mL via OROMUCOSAL
  Filled 2018-05-06 (×6): qty 15

## 2018-05-06 MED ORDER — IOPAMIDOL (ISOVUE-370) INJECTION 76%
INTRAVENOUS | Status: AC
  Filled 2018-05-06: qty 100

## 2018-05-06 MED ORDER — SODIUM CHLORIDE 0.9 % IV SOLN
INTRAVENOUS | Status: AC
  Administered 2018-05-06: 17:00:00 via INTRAVENOUS

## 2018-05-06 NOTE — Progress Notes (Signed)
[address]       [place] [address]             [phone]     CARDIOTHORACIC SURGERY PROGRESS NOTE  3 Days Post-Op  S/P Procedure(s) (LRB): TRANSESOPHAGEAL ECHOCARDIOGRAM (TEE) (N/A)  Subjective: No complaints.  Reports breathing improved  Objective: Vital signs in last 24 hours: Temp:  [97.5 F (36.4 C)-98.3 F (36.8 C)] 98.1 F (36.7 C) (12/09 0440) Pulse Rate:  [54-60] 60 (12/09 0440) Cardiac Rhythm: Atrial fibrillation (12/09 0011) Resp:  [18-19] 18 (12/09 0440) BP: (101-115)/(52-63) 101/57 (12/09 0440) SpO2:  [98 %-99 %] 98 % (12/09 0440) Weight:  [83.4 kg] 83.4 kg (12/09 0440)  Physical Exam:  Rhythm:   Afib  Breath sounds: clear  Heart sounds:  Irregular, grade III/VI holosystolic murmur  Incisions:  n/a  Abdomen:  soft  Extremities:  Warm, no edema   Intake/Output from previous day: No intake/output data recorded. Intake/Output this shift: No intake/output data recorded.  Lab Results: Recent Labs    05/05/18 0418 05/06/18 0427  WBC 6.2 5.6  HGB 9.5* 9.5*  HCT 29.7* 29.5*  PLT 157 145*   BMET:  Recent Labs    05/04/18 0431  NA 141  K 3.8  CL 108  CO2 23  GLUCOSE 112*  BUN 30*  CREATININE 1.80*  CALCIUM 8.7*    CBG (last 3)  Recent Labs    05/05/18 1629 05/05/18 2143 05/06/18 0741  GLUCAP 132* 95 96   PT/INR:  No results for input(s): LABPROT, INR in the last 72 hours.  CXR:  N/A  Transesophageal Echocardiography  Patient:    Henrickson, [place] D MR #:       [id number] Study Date: 05/03/2018 Gender:     F Age:        77 Height:     170.2 cm Weight:     84.7 kg BSA:        2.02 m^2 Pt. Status: Room:   Malvin Johns, MD  PERFORMING   Dixie Dials, MD  SONOGRAPHER  Florentina Jenny, RDCS  cc:  ------------------------------------------------------------------- LV EF: 55% -   60%  ------------------------------------------------------------------- Study Conclusions  - Left  ventricle: Systolic function was normal. The estimated   ejection fraction was in the range of 55% to 60%. Wall motion was   normal; there were no regional wall motion abnormalities. - Aortic valve: Moderate focal calcification involving the right   coronary, left coronary, and noncoronary cusp. Cusp separation   was mildly reduced. Noncoronary cusp mobility was mildly   restricted. There was mild stenosis. There was mild   regurgitation. - Mitral valve: Mildly calcified annulus. Moderate diffuse   dysplasia of the posterior leaflet. Leaflet separation was mildly   reduced. Mild systolic bowing without prolapse, involving the   middle segment of the anterior leaflet. The findings are   consistent with mild stenosis. There was severe regurgitation,   with multiple jets directed centrally, eccentrically, and toward   the free wall. - Left atrium: The atrium was severely dilated. No evidence of   thrombus in the atrial cavity or appendage. No evidence of   thrombus in the appendage. There was spontaneous echo contrast   (&quot;smoke&quot;). - Right atrium: The atrium was mildly dilated. No evidence of   thrombus in the atrial cavity or appendage. - Atrial septum: No defect or patent foramen ovale was identified. - Superior vena cava: The study excluded  a thrombus. - Pericardium, extracardiac: A trivial to small pericardial   effusion was identified.  ------------------------------------------------------------------- Study data:   Study status:  Routine.  Consent:  The risks, benefits, and alternatives to the procedure were explained to the patient and informed consent was obtained.  Procedure:  The patient reported no pain pre or post test. Initial setup. The patient was brought to the laboratory. Surface ECG leads were monitored. Sedation. Conscious sedation was administered by cardiology staff. Transesophageal echocardiography. An adult multiplane transesophageal probe was inserted  by the attending cardiologistwithout difficulty. Image quality was adequate.  Study completion:  The patient tolerated the procedure well. There were no complications.  Administered medications:   Fentanyl, 50mcg, IV.  Midazolam, 3mg , IV.          Diagnostic transesophageal echocardiography.  2D and color Doppler.  Birthdate:  Patient birthdate: [birth year].  Age:  Patient is 77 yr old.  Sex:  Gender: female.    BMI: 29.2 kg/m^2.  Blood pressure:     105/70  Patient status:  Inpatient.  Study date:  Study date: 05/03/2018. Study time: 03:44 PM.  Location:  Endoscopy.  -------------------------------------------------------------------  ------------------------------------------------------------------- Left ventricle:  Systolic function was normal. The estimated ejection fraction was in the range of 55% to 60%. Wall motion was normal; there were no regional wall motion abnormalities.  ------------------------------------------------------------------- Aortic valve:   Trileaflet; mildly thickened, moderately calcified leaflets.  Moderate focal calcification involving the right coronary, left coronary, and noncoronary cusp. Cusp separation was mildly reduced.  Noncoronary cusp mobility was mildly restricted. Doppler:   There was mild stenosis.   There was mild regurgitation.   ------------------------------------------------------------------- Aorta:  There was mild, localized atheromatous plaque in the descending aorta. There was mild, localized atheromatous plaque in the mid aortic arch. There was no evidence for dissection. Aortic root: The aortic root was not dilated. Ascending aorta: The ascending aorta was normal in size. Aortic arch: The aortic arch was normal in size. Descending aorta: The descending aorta was normal in size.  ------------------------------------------------------------------- Mitral valve:  Well visualized.  Mildly calcified annulus. Moderate diffuse  dysplasia of the posterior leaflet. Leaflet separation was mildly reduced.  Mild systolic bowing without prolapse, involving the middle segment of the anterior leaflet. Doppler:   The findings are consistent with mild stenosis.   There was severe regurgitation, with multiple jets directed centrally, eccentrically, and toward the free wall.  ------------------------------------------------------------------- Left atrium:  The atrium was severely dilated.  No evidence of thrombus in the atrial cavity or appendage.  No evidence of thrombus in the appendage. There was spontaneous echo contrast (&quot;smoke&quot;).  ------------------------------------------------------------------- Atrial septum:  No defect or patent foramen ovale was identified.   ------------------------------------------------------------------- Pulmonary veins:  Well visualized.  ------------------------------------------------------------------- Right ventricle:  The cavity size was normal. Wall thickness was normal. Systolic function was normal.  ------------------------------------------------------------------- Pulmonic valve:    Structurally normal valve.    Doppler:  There was mild regurgitation.  ------------------------------------------------------------------- Tricuspid valve:   Structurally normal valve.   Leaflet separation was normal.  Doppler:  There was mild regurgitation.  ------------------------------------------------------------------- Pulmonary artery:   The main pulmonary artery was normal-sized.  ------------------------------------------------------------------- Right atrium:  The atrium was mildly dilated.  No evidence of thrombus in the atrial cavity or appendage. The appendage was morphologically a right appendage.  ------------------------------------------------------------------- Pericardium:  A trivial to small pericardial effusion  was identified.  ------------------------------------------------------------------- Systemic veins: Superior vena cava: The study excluded a thrombus.  ------------------------------------------------------------------- Measurements   Mitral valve  Value  Mitral regurg VTI, PISA      161   cm  Legend: (L)  and  (H)  mark values outside specified reference range.  ------------------------------------------------------------------- Prepared and Electronically Authenticated by  Dixie Dials, MD 2019-12-07T17:05:49   Assessment/Plan: S/P Procedure(s) (LRB): TRANSESOPHAGEAL ECHOCARDIOGRAM (TEE) (N/A)  I have personally reviewed the patient's transesophageal echocardiogram which demonstrates presence of severe mitral regurgitation and mild left ventricular systolic dysfunction.  Functional anatomy of the mitral valve appears consistent with type IIIa dysfunction caused by severe restriction of both leaflets, primarily the posterior leaflet.  There is leaflet thickening and there appears to be calcification of the posterior annulus.  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  There is severe left atrial enlargement.  Right ventricular size and function appears normal.  There was mild tricuspid regurgitation.  Aortic valve is trileaflet.  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  We tentatively plan for minimally invasive mitral valve repair or replacement and Maze procedure on Thursday, May 09, 2018.  We will obtain CT angiogram of the aorta and iliac vessels to evaluate the feasibility of peripheral cannulation for surgery.  We will request that radiology use minimal amount of contrast if possible for their examination and order some gentle IV hydration to minimize the risk of contrast  nephropathy.  I spent in excess of 15 minutes during the conduct of this hospital encounter and >50% of this time involved direct face-to-face encounter with the patient for counseling and/or coordination of their care.    Rexene Alberts, MD 05/06/2018 9:12 AM

## 2018-05-06 NOTE — Progress Notes (Signed)
Subjective:  Doing well.  Denies any chest pain or shortness of breath.  Scheduled for MVR/maze procedure on Thursday.  Objective:  Vital Signs in the last 24 hours: Temp:  [97.5 F (36.4 C)-98.3 F (36.8 C)] 98.1 F (36.7 C) (12/09 0440) Pulse Rate:  [54-60] 60 (12/09 0440) Resp:  [18-19] 18 (12/09 0440) BP: (101-110)/(52-63) 101/57 (12/09 0440) SpO2:  [98 %-99 %] 98 % (12/09 0440) Weight:  [83.4 kg] 83.4 kg (12/09 0440)  Intake/Output from previous day: No intake/output data recorded. Intake/Output from this shift: Total I/O In: 720 [P.O.:720] Out: -   Physical Exam: Neck: no adenopathy, no carotid bruit, no JVD and supple, symmetrical, trachea midline Lungs: clear to auscultation bilaterally Heart: irregularly irregular rhythm, S1, S2 normal and 2/6 systolic and soft diastolic murmur noted Abdomen: soft, non-tender; bowel sounds normal; no masses,  no organomegaly Extremities: extremities normal, atraumatic, no cyanosis or edema  Lab Results: Recent Labs    05/05/18 0418 05/06/18 0427  WBC 6.2 5.6  HGB 9.5* 9.5*  PLT 157 145*   Recent Labs    05/04/18 0431  NA 141  K 3.8  CL 108  CO2 23  GLUCOSE 112*  BUN 30*  CREATININE 1.80*   No results for input(s): TROPONINI in the last 72 hours.  Invalid input(s): CK, MB Hepatic Function Panel No results for input(s): PROT, ALBUMIN, AST, ALT, ALKPHOS, BILITOT, BILIDIR, IBILI in the last 72 hours. No results for input(s): CHOL in the last 72 hours. No results for input(s): PROTIME in the last 72 hours.  Imaging: Imaging results have been reviewed and No results found.  Cardiac Studies:  Assessment/Plan:  Severe symptomatic mitral regurgitation. Pulmonary hypertension. 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 Hypertension. Diabetes mellitus. Hyperlipidemia. Osteoarthritis Plan Scheduled for CT angiogram of aorta and iliac vessels today. Agree with slow  gentle hydration.   LOS: 4 days    Diane [name] 05/06/2018, 2:17 PM

## 2018-05-06 NOTE — Progress Notes (Signed)
ANTICOAGULATION CONSULT NOTE - Follow Up Consult  Pharmacy Consult for Heparin Indication: atrial fibrillation  Allergies  Allergen Reactions  . Shellfish Allergy Anaphylaxis    Patient Measurements: Height: 5\' 7"  (170.2 cm) Weight: 183 lb 14.4 oz (83.4 kg) IBW/kg (Calculated) : 61.6 Heparin Dosing Weight: 78 kg  Vital Signs: Temp: 98.1 F (36.7 C) (12/09 0440) Temp Source: Oral (12/09 0440) BP: 101/57 (12/09 0440) Pulse Rate: 60 (12/09 0440)  Labs: Recent Labs    05/04/18 0431 05/04/18 1434 05/05/18 0418 05/06/18 0427 05/06/18 0447  HGB 9.5*  --  9.5* 9.5*  --   HCT 29.8*  --  29.7* 29.5*  --   PLT 175  --  157 145*  --   HEPARINUNFRC 0.82* 0.70 0.52  --  0.47  CREATININE 1.80*  --   --   --   --     Estimated Creatinine Clearance: 30.4 mL/min (A) (by C-G formula based on SCr of 1.8 mg/dL (H)).   Assessment:  Anticoag: Pradaxa PTA for AFib (LD 12/3) - held for cath >> IV heparin while awaiting TCTS consult for severe MVR.CHA2DS2VASc score of 4. HL 0.47. Hgb 9.5 stable. Plts 145 dropping.   Goal of Therapy:  Heparin level 0.3-0.7 units/ml Monitor platelets by anticoagulation protocol: Yes   Plan:  Continue heparin at 900 units/hr Monitor daily heparin level and CBC, s/sx bleeding Pradaxa on hold Tentatively plan for minimally invasive mitral valve repair or replacement and Maze procedure on Thursday, May 09, 2018  Crystal S. Alford Highland, PharmD, BCPS Clinical Staff Pharmacist Eilene Ghazi Stillinger 05/06/2018,9:52 AM

## 2018-05-06 NOTE — Progress Notes (Signed)
CARDIAC REHAB PHASE I   Offered to walk with pt. Pt declining stating she is moving around the room independently, but gets SOB with walking much further. Pt given IS, demonstrates 750. Encouraged continued use while waiting for surgery. Will continue to follow and encourage ambulation as pt allows.  [phone] Rufina Falco, RN BSN 05/06/2018 10:43 AM

## 2018-05-06 NOTE — Consult Note (Signed)
DENTAL CONSULTATION  Date of Consultation:  05/06/2018 Patient Name:   Diane [name] Date of Birth:   [birth year] Medical Record Number: [id number]  VITALS: BP (!) 101/57 (BP Location: Left Arm)   Pulse 60   Temp 98.1 F (36.7 C) (Oral)   Resp 18   Ht 5\' 7"  (1.702 m)   Wt 83.4 kg   SpO2 98%   BMI 28.80 kg/m   CHIEF COMPLAINT: The patient was referred by Dr. Roxy Manns for a dental consultation.  HPI: Diane [name] is a 77 year old female recently diagnosed with severe mitral regurgitation.  Patient with anticipated mitral valve replacement or repair with Dr. Roxy Manns.  Patient is now seen as part of a medically necessary pre-heart valve surgery dental protocol examination.  The patient currently denies acute toothaches, swellings, or abscesses.  Patient was last seen approximately 6 months ago for an exam and cleaning and multiple restorations by Dr. Delia Chimes, in [place], [place].  Prior to that , the patient had not been seen for at least 10 years.  Patient had a maxillary acrylic partial denture but lost that approximately 3 months ago.  This replaced tooth #8.  Patient is contemplating a 4 unit bridge to replace this tooth at this time.  Patient denies having pain, percussion or palpation sensitivity associated with tooth #30 that had a previous root canal therapy many years ago.  The patient denies having dental phobia.  PROBLEM LIST: Patient Active Problem List   Diagnosis Date Noted  . Severe mitral regurgitation 05/02/2018  . Chronic diastolic heart failure ([place]) 01/03/2018  . C. difficile colitis 07/03/2017  . CKD (chronic kidney disease) stage 3, GFR 30-59 ml/min (HCC) 07/03/2017  . Diabetes mellitus type 2 in obese ([place]) 07/03/2017  . PAF (paroxysmal atrial fibrillation) ([place]) 07/03/2017  . Hypokalemia 01/15/2014  . Neuropathy due to chemotherapeutic drug ([place]) 03/05/2013  . Swelling of limb 01/23/2013  . Oropharyngeal candidiasis 01/13/2013  . Chemotherapy  induced neutropenia ([place]) 01/13/2013  . UTI (urinary tract infection) 12/25/2012  . Cancer of central portion of right female breast ([place]) 09/06/2012  . Vulva cancer ([place]) 08/16/2011  . INSOMNIA 08/26/2010  . HEADACHE 08/26/2010  . TOBACCO USER 11/04/2009  . POSTMENOPAUSAL STATUS 11/04/2009  . DYSLIPIDEMIA 07/30/2009  . HAIR LOSS 07/30/2009  . ANEMIA 07/26/2009  . SINUSITIS, ACUTE 04/02/2009  . DIABETES MELLITUS, TYPE II 01/01/2009  . URINARY TRACT INFECTION SITE NOT SPECIFIED 11/26/2008  . BREAST PAIN, RIGHT 11/12/2008  . ANGIOEDEMA 10/05/2008  . CHEST PAIN, ATYPICAL 09/01/2008  . ALLERGIC RHINITIS 05/05/2008  . VAGINITIS 05/05/2008  . CAROTID BRUIT, RIGHT 11/12/2007  . SHOULDER PAIN, LEFT, CHRONIC 08/09/2007  . 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 DEGENERATIVE DISC DISEASE, LUMBOSACRAL SPINE 08/09/2007  . HYPERTENSION, BENIGN ESSENTIAL 03/20/2007  . BRONCHITIS, ACUTE 03/20/2007  . DEPRESSION 02/27/2007  . MURMUR 02/27/2007    PMH: Past Medical History:  Diagnosis Date  . Anxiety   . Arthritis    back  . Atrial fibrillation ([place])   . Breast cancer ([place])    right breast cancer - 3 years ago  . Chronic diastolic heart failure (HCC)    ECHO: 06/20/2017 - Grade 2 Diastolic Dysfunction   . Coronary artery disease   . Depression   . Diabetes mellitus without complication (HCC)    diet  . Dysrhythmia    afib  . Fibromyalgia   . Herniated disc   . Hyperlipemia   . Hypertension   . Iron deficiency anemia   .  Lower back pain   . Lumbar stenosis    L4-5  . S/P radiation therapy 01/23/2013-03/06/2013   Right breast / 46 Gy in 23 fractions / Right breast boost / 14 Gy in 7 fractions  . Severe mitral valve regurgitation   . Spondylolysis   . 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 Status post chemotherapy 11/04/2012 - 01/06/2013.   Docetaxel/Cytoxan Q 3 Weeks x 4 cycles  . Torn rotator cuff     PSH: Past Surgical History:   Procedure Laterality Date  . ABDOMINAL HYSTERECTOMY    . BREAST BIOPSY Right 09/04/2012  . BREAST LUMPECTOMY Right 10/08/2012  . COLONOSCOPY    . DILATION AND CURETTAGE OF UTERUS    . LEFT HEART CATHETERIZATION WITH CORONARY ANGIOGRAM N/A 09/14/2014   Procedure: LEFT HEART CATHETERIZATION WITH CORONARY ANGIOGRAM;  Surgeon: [name], MD;  Location[facility] CATH LAB;  Service: Cardiovascular;  Laterality: N/A;  . OPEN REDUCTION INTERNAL FIXATION (ORIF) PROXIMAL PHALANX Right 01/04/2016   Procedure: OPEN TREATMENT OF RIGHT THUMB PROXIMAL PHALANX FRACTURE;  Surgeon: Milly Jakob, MD;  Location: [place];  Service: Orthopedics;  Laterality: Right;  . PARTIAL MASTECTOMY WITH NEEDLE LOCALIZATION AND AXILLARY SENTINEL LYMPH NODE BX Right 10/08/2012   Procedure: RIGHT PARTIAL MASTECTOMY WITH NEEDLE LOCALIZATION AND RIGHT AXILLARY SENTINEL LYMPH NODE BX;  Surgeon: Adin Hector, MD;  Location: [employer];  Service: General;  Laterality: Right;  Injection of 1% Methylene Blue dye into right breast  . PORTACATH PLACEMENT Left 10/08/2012   Procedure: INSERTION PORT-A-CATH WITH ULTRASOUND GUIDANCE;  Surgeon: Adin Hector, MD;  Location: [place];  Service: General;  Laterality: Left;  Using 68F [place]  . Posterior Lumbar Arthrodesis, Pedicle screw fixation, Posterolateral Arthodesis  03/17/11  . RIGHT/LEFT HEART CATH AND CORONARY ANGIOGRAPHY N/A 05/02/2018   Procedure: RIGHT/LEFT HEART CATH AND CORONARY ANGIOGRAPHY;  Surgeon: [name], MD;  Location: [place] CV LAB;  Service: Cardiovascular;  Laterality: N/A;  . SHOULDER ARTHROSCOPY WITH ROTATOR CUFF REPAIR AND SUBACROMIAL DECOMPRESSION  06/04/2012   Procedure: SHOULDER ARTHROSCOPY WITH ROTATOR CUFF REPAIR AND SUBACROMIAL DECOMPRESSION;  Surgeon: Nita Sells, MD;  Location: [place];  Service: Orthopedics;  Laterality: Left;  LEFT SHOULDER ARTHROSCOPY WITH ROTATOR CUFF REPAIR AND SUBACROMIAL  DECOMPRESSION AND DISTAL CLAVICLE RESECTION  . TEE WITHOUT CARDIOVERSION N/A 05/03/2018   Procedure: TRANSESOPHAGEAL ECHOCARDIOGRAM (TEE);  Surgeon: Dixie Dials, MD;  Location: [facility] ENDOSCOPY;  Service: Cardiovascular;  Laterality: N/A;  . TONSILLECTOMY    . TUBAL LIGATION    . VULVECTOMY PARTIAL    . Wide Local Excision  01/2010   Bilateral groin excisions, re-excision of vulva    ALLERGIES: Allergies  Allergen Reactions  . Shellfish Allergy Anaphylaxis    MEDICATIONS: Current Facility-Administered Medications  Medication Dose Route Frequency Provider Last Rate Last Dose  . 0.9 %  sodium chloride infusion  250 mL Intravenous PRN [name], MD      . acetaminophen (TYLENOL) tablet 650 mg  650 mg Oral Q4H PRN [name], MD   650 mg at 05/05/18 1800  . albuterol (PROVENTIL) (2.5 MG/3ML) 0.083% nebulizer solution 2.5 mg  2.5 mg Inhalation Q4H PRN [name], MD      . aspirin EC tablet 81 mg  81 mg Oral Daily [name], MD   81 mg at 05/05/18 0928  . atorvastatin (LIPITOR) tablet 20 mg  20 mg Oral Daily [name], MD  20 mg at 05/05/18 0928  . diltiazem (CARDIZEM CD) 24 hr capsule 180 mg  180 mg Oral Daily [name], MD   180 mg at 05/05/18 0928  . FLUoxetine (PROZAC) capsule 10 mg  10 mg Oral Daily [name], MD   10 mg at 05/05/18 0929  . heparin ADULT infusion 100 units/mL (25000 units/212mL sodium chloride 0.45%)  900 Units/hr Intravenous Continuous Romona Curls, RPH 9 mL/hr at 05/05/18 2012 900 Units/hr at 05/05/18 2012  . insulin aspart (novoLOG) injection 0-9 Units  0-9 Units Subcutaneous TID WC [name], MD   1 Units at 05/05/18 1755  . levalbuterol (XOPENEX) nebulizer solution 0.63 mg  0.63 mg Nebulization Q6H PRN [name], MD      . nitroGLYCERIN (NITROSTAT) SL tablet 0.4 mg  0.4 mg Sublingual Q5 min PRN [name], MD      . ondansetron (ZOFRAN) injection 4 mg  4 mg Intravenous Q6H PRN [name], MD      . sodium chloride  flush (NS) 0.9 % injection 3 mL  3 mL Intravenous Q12H [name], MD   3 mL at 05/05/18 2015  . sodium chloride flush (NS) 0.9 % injection 3 mL  3 mL Intravenous PRN [name], MD      . traZODone (DESYREL) tablet 50 mg  50 mg Oral QHS [name], MD   50 mg at 05/05/18 2013    LABS: Lab Results  Component Value Date   WBC 5.6 05/06/2018   HGB 9.5 (L) 05/06/2018   HCT 29.5 (L) 05/06/2018   MCV 80.8 05/06/2018   PLT 145 (L) 05/06/2018      Component Value Date/Time   NA 141 05/04/2018 0431   NA 138 01/11/2015 1054   K 3.8 05/04/2018 0431   K 3.4 (L) 01/11/2015 1054   CL 108 05/04/2018 0431   CL 105 11/04/2012 0937   CO2 23 05/04/2018 0431   CO2 23 01/11/2015 1054   GLUCOSE 112 (H) 05/04/2018 0431   GLUCOSE 99 01/11/2015 1054   GLUCOSE 152 (H) 11/04/2012 0937   BUN 30 (H) 05/04/2018 0431   BUN 18.4 01/11/2015 1054   CREATININE 1.80 (H) 05/04/2018 0431   CREATININE 1.4 (H) 01/11/2015 1054   CALCIUM 8.7 (L) 05/04/2018 0431   CALCIUM 8.8 01/11/2015 1054   GFRNONAA 27 (L) 05/04/2018 0431   GFRAA 32 (L) 05/04/2018 0431   Lab Results  Component Value Date   INR 1.43 05/03/2018   INR 1.28 09/13/2014   INR 1.17 10/03/2012   No results found for: PTT  SOCIAL HISTORY: Social History   Socioeconomic History  . Marital status: Widowed    Spouse name: Not on file  . Number of children: Not on file  . Years of education: Not on file  . Highest education level: Not on file  Occupational History  . Occupation: retired  Scientific laboratory technician  . Financial resource strain: Not on file  . Food insecurity:    Worry: Not on file    Inability: Not on file  . Transportation needs:    Medical: Not on file    Non-medical: Not on file  Tobacco Use  . Smoking status: Former Smoker    Years: 50.00    Types: Cigarettes    Last attempt to quit: 05/29/2009    Years since quitting: 8.9  . Smokeless tobacco: Never Used  Substance and Sexual Activity  . Alcohol use: Yes     Comment: rare  . Drug  use: No  . Sexual activity: Not Currently  Lifestyle  . Physical activity:    Days per week: Not on file    Minutes per session: Not on file  . Stress: Not on file  Relationships  . Social connections:    Talks on phone: Not on file    Gets together: Not on file    Attends religious service: Not on file    Active member of club or organization: Not on file    Attends meetings of clubs or organizations: Not on file    Relationship status: Not on file  . Intimate partner violence:    Fear of current or ex partner: Not on file    Emotionally abused: Not on file    Physically abused: Not on file    Forced sexual activity: Not on file  Other Topics Concern  . Not on file  Social History Narrative   Tobacco use cigarettes: former smoker, quit in year Sept 2011, Pack-year Hx: 60, tobacco history last updated 10/10/2013. No smoking. Per patient stopped smoking 9 months ago. No alcohol. Caffeine.: yes, 2+ servings daily, tea. No recreational drug use. Exercise: [employer] water aer[employer]. Marital status: widow.    FAMILY HISTORY: Family History  Problem Relation Age of Onset  . Stroke Mother   . Alzheimer's disease Mother   . Heart attack Father   . Heart attack Brother   . Breast cancer Neg Hx     REVIEW OF SYSTEMS: Reviewed with the patient as per History of present illness. Psych: Patient denies having dental phobia.  DENTAL HISTORY: CHIEF COMPLAINT: The patient was referred by Dr. Roxy Manns for a dental consultation.  HPI: Diane [name] is a 77 year old female recently diagnosed with severe mitral regurgitation.  Patient with anticipated mitral valve replacement or repair with Dr. Roxy Manns.  Patient is now seen as part of a medically necessary pre-heart valve surgery dental protocol examination.  The patient currently denies acute toothaches, swellings, or abscesses.  Patient was last seen approximately 6 months ago for an exam and cleaning and multiple  restorations by Dr. Delia Chimes, in [place], [place].  Prior to that , the patient had not been seen for at least 10 years.  Patient had a maxillary acrylic partial denture but lost that approximately 3 months ago.  This replaced tooth #8.  Patient is contemplating a 4 unit bridge to replace this tooth at this time.  Patient denies having pain, percussion or palpation sensitivity associated with tooth #30 that had a previous root canal therapy many years ago.  The patient denies having dental phobia.  DENTAL EXAMINATION: GENERAL: The patient is a well-developed, well-nourished female no acute distress. HEAD AND NECK: There is no palpable neck lymphadenopathy.  Patient denies acute TMJ symptoms.   INTRAORAL EXAM: The patient has normal saliva.  There is no evidence of oral abscess formation.  Patient has a large palatal torus.  Patient also has large bilateral maxillary palatal exostoses. DENTITION: The patient is missing tooth numbers 1, 8, 14, 19, and 32.  Tooth #19 has been replaced with a bridge.  Tooth #8 was previously replaced with a partial denture, but this partial denture has been lost. PERIODONTAL: The patient has chronic periodontitis with plaque and calculus accumulations, gingival recession, and incipient to moderate bone loss.  There is no significant tooth mobility. DENTAL CARIES/SUBOPTIMAL RESTORATIONS: No obvious dental caries are noted.   ENDODONTIC: Patient currently denies acute pulpitis symptoms.  There is a persistent periapical radiolucency at  the apex of tooth #30 that has had a previous root canal therapy.  The patient, however, denies any acute pulpitis symptoms, percussion or palpation sensitivity.  This may represent an apical scar.  This can be further evaluated by her primary dentist or an endodontist as indicated for retreatment. CROWN AND BRIDGE: Patient has multiple crown and bridge restorations that appear to be acceptable.  The patient can be evaluated for future  crown and bridge therapy by her primary dentist. PROSTHODONTIC: Patient had a history of a maxillary acrylic partial denture tooth #8.  This was lost approximately 3 months ago. OCCLUSION: The patient has a poor occlusal scheme secondary to multiple missing teeth and lack replacement of missing teeth with dental prostheses.  Occlusion is stable, however.  RADIOGRAPHIC INTERPRETATION: Orthopantogram was taken on 05/03/2018. There are multiple missing teeth.  There is supraeruption and drifting of the unopposed teeth into the edentulous areas.  There is incipient to moderate bone loss.  Multiple crown and bridge restorations are noted.  There is a root canal therapy on tooth #30.  There is a questionable persistent periapical radiolucency may represent an apical scar.     ASSESSMENTS: 1.  Severe mitral regurgitation 2.  Pre-heart valve surgery dental protocol 3.  Chronic periodontitis with bone loss 4.  Gingival recession 5.  Accretions 6.  Palatal torus 7.  Bilateral maxillary palatal exostoses 8.  Multiple missing teeth 9.  Supraeruption and drifting of the unopposed teeth into the edentulous areas 10.  Poor occlusal scheme but a stable occlusion 11. Risk for bleeding with invasive dental procedures due to current heparin therapy. 12. Future need for antibiotic premedication prior to invasive dental procedures due to anticipated heart valve surgery.  PLAN/RECOMMENDATIONS: 1. I discussed the risks, benefits, and complications of various treatment options with the patient in relationship to her medical and dental conditions, severe mitral regurgitation, anticipated heart valve surgery, and future risk for endocarditis.  We discussed various treatment options to include no treatment, extraction of tooth #30 with alveoloplasty, pre-prosthetic surgery as indicated, periodontal therapy, dental restorations, root canal therapy, crown and bridge therapy, implant therapy, and replacement of missing  teeth as indicated.  The patient adamantly refused extraction of tooth #30 at this time.  Patient to follow-up with her primary dentist for future discussion of referral for endodontic therapy reevaluation on tooth #30.  Patient is aware of the need for antibiotic premedication prior to invasive dental procedures after the anticipated heart valve surgery.  Patient will be placed on chlorhexidine rinses for twice a day use at this time.  The patient is currently cleared for heart valve surgery.  2. Discussion of findings with medical team and coordination of future medical and dental care as needed.    Lenn Cal, DDS

## 2018-05-07 ENCOUNTER — Inpatient Hospital Stay (HOSPITAL_COMMUNITY): Payer: Medicare Other

## 2018-05-07 DIAGNOSIS — Z01810 Encounter for preprocedural cardiovascular examination: Secondary | ICD-10-CM | POA: Diagnosis not present

## 2018-05-07 LAB — CBC
HCT: 28.5 % — ABNORMAL LOW (ref 36.0–46.0)
Hemoglobin: 9 g/dL — ABNORMAL LOW (ref 12.0–15.0)
MCH: 25.6 pg — ABNORMAL LOW (ref 26.0–34.0)
MCHC: 31.6 g/dL (ref 30.0–36.0)
MCV: 81.2 fL (ref 80.0–100.0)
Platelets: 152 10*3/uL (ref 150–400)
RBC: 3.51 MIL/uL — ABNORMAL LOW (ref 3.87–5.11)
RDW: 17.1 % — ABNORMAL HIGH (ref 11.5–15.5)
WBC: 5.6 10*3/uL (ref 4.0–10.5)
nRBC: 0 % (ref 0.0–0.2)

## 2018-05-07 LAB — COMPREHENSIVE METABOLIC PANEL
ALT: 37 U/L (ref 0–44)
AST: 36 U/L (ref 15–41)
Albumin: 2.9 g/dL — ABNORMAL LOW (ref 3.5–5.0)
Alkaline Phosphatase: 70 U/L (ref 38–126)
Anion gap: 9 (ref 5–15)
BUN: 24 mg/dL — ABNORMAL HIGH (ref 8–23)
CO2: 20 mmol/L — ABNORMAL LOW (ref 22–32)
Calcium: 8.3 mg/dL — ABNORMAL LOW (ref 8.9–10.3)
Chloride: 110 mmol/L (ref 98–111)
Creatinine, Ser: 1.55 mg/dL — ABNORMAL HIGH (ref 0.44–1.00)
GFR calc Af Amer: 38 mL/min — ABNORMAL LOW (ref >60)
GFR calc non Af Amer: 33 mL/min — ABNORMAL LOW (ref >60)
Glucose, Bld: 101 mg/dL — ABNORMAL HIGH (ref 70–99)
Potassium: 3.9 mmol/L (ref 3.5–5.1)
Sodium: 139 mmol/L (ref 135–145)
Total Bilirubin: 0.3 mg/dL (ref 0.3–1.2)
Total Protein: 6 g/dL — ABNORMAL LOW (ref 6.5–8.1)

## 2018-05-07 LAB — GLUCOSE, CAPILLARY
Glucose-Capillary: 103 mg/dL — ABNORMAL HIGH (ref 70–99)
Glucose-Capillary: 79 mg/dL (ref 70–99)
Glucose-Capillary: 102 mg/dL — ABNORMAL HIGH (ref 70–99)
Glucose-Capillary: 105 mg/dL — ABNORMAL HIGH (ref 70–99)

## 2018-05-07 LAB — BRAIN NATRIURETIC PEPTIDE: B Natriuretic Peptide: 791.1 pg/mL — ABNORMAL HIGH (ref 0.0–100.0)

## 2018-05-07 LAB — HEPARIN LEVEL (UNFRACTIONATED): Heparin Unfractionated: 0.38 IU/mL (ref 0.30–0.70)

## 2018-05-07 NOTE — Progress Notes (Signed)
Subjective:  States had difficulty breathing last night breathing slightly improved this morning.  Denies any chest pain.  Patient had CT angiogram yesterday.  Creatinine is stable Objective:  Vital Signs in the last 24 hours: Temp:  [97.5 F (36.4 C)-98.6 F (37 C)] 97.6 F (36.4 C) (12/10 0436) Pulse Rate:  [53-83] 60 (12/10 0436) Resp:  [19] 19 (12/09 1653) BP: (124-131)/(55-70) 128/55 (12/10 1002) SpO2:  [93 %-98 %] 96 % (12/10 0436) Weight:  [88 kg] 88 kg (12/10 0436)  Intake/Output from previous day: 12/09 0701 - 12/10 0700 In: 1857.3 [P.O.:960; I.V.:897.3] Out: -  Intake/Output from this shift: No intake/output data recorded.  Physical Exam: Neck: no adenopathy, no carotid bruit, no JVD and supple, symmetrical, trachea midline Lungs: decreased breath sounds at bases Heart: irregularly irregular rhythm, S1, S2 normal and 2/6 systolic and soft diastolic murmur noted Abdomen: soft, non-tender; bowel sounds normal; no masses,  no organomegaly Extremities: extremities normal, atraumatic, no cyanosis or edema  Lab Results: Recent Labs    05/06/18 0427 05/07/18 0355  WBC 5.6 5.6  HGB 9.5* 9.0*  PLT 145* 152   Recent Labs    05/06/18 2040 05/07/18 0355  NA  --  139  K  --  3.9  CL  --  110  CO2  --  20*  GLUCOSE  --  101*  BUN 28* 24*  CREATININE 1.63* 1.55*   No results for input(s): TROPONINI in the last 72 hours.  Invalid input(s): CK, MB Hepatic Function Panel Recent Labs    05/07/18 0355  PROT 6.0*  ALBUMIN 2.9*  AST 36  ALT 37  ALKPHOS 70  BILITOT 0.3   No results for input(s): CHOL in the last 72 hours. No results for input(s): PROTIME in the last 72 hours.  Imaging: Imaging results have been reviewed and Ct Angio Chest/abd/pel For Dissection W And/or W/wo  Result Date: 05/06/2018 CLINICAL DATA:  77 y/o F; Aortic disease, nontraumatic, known or suspected r/o aortic dissection and aortoiliac occlussive disease. EXAM: CT ANGIOGRAPHY CHEST,  ABDOMEN AND PELVIS TECHNIQUE: Multidetector CT imaging through the chest, abdomen and pelvis was performed using the standard protocol during bolus administration of intravenous contrast. Multiplanar reconstructed images and MIPs were obtained and reviewed to evaluate the vascular anatomy. CONTRAST:  138mL ISOVUE-370 IOPAMIDOL (ISOVUE-370) INJECTION 76% COMPARISON:  12/06/2017 CT chest. FINDINGS: CTA CHEST FINDINGS Cardiovascular: Moderate cardiomegaly. Mild coronary artery and aortic calcific atherosclerosis. No pericardial effusion. Normal caliber thoracic aorta. Satisfactory opacification of the thoracic aorta. No aortic dissection, aneurysm, or findings of vasculitis. Borderline enlarged main pulmonary artery compatible with pulmonary artery hypertension in the appropriate clinical setting. Satisfactory opacification of pulmonary arteries. No pulmonary embolus identified. Mediastinum/Nodes: No enlarged mediastinal, hilar, or axillary lymph nodes. Thyroid gland, trachea, and esophagus demonstrate no significant findings. Lungs/Pleura: Mild centrilobular emphysema with upper lobe predominance. Mosaic ground-glass opacities of the lung similar to prior study. Small right-sided pleural effusion. Few 2-3 mm pulmonary nodules within the right lower lobe are new from the prior CT, likely infectious or inflammatory. Mild bronchiectasis in the left lung base. Mild interlobular septal thickening. Musculoskeletal: No acute fracture. Review of the MIP images confirms the above findings. CTA ABDOMEN AND PELVIS FINDINGS VASCULAR Aorta: Normal caliber aorta without aneurysm, dissection, vasculitis or significant stenosis. 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 SMA: Patent without evidence of aneurysm, dissection, vasculitis or significant stenosis. Renals: Both renal arteries are patent without evidence of aneurysm, dissection,  vasculitis, fibromuscular dysplasia or  significant stenosis. Accessory right renal artery. IMA: Calcified plaque of the proximal IMA. Vessel size diminutive, suboptimal to quantify stenosis period. Inflow: Patent without evidence of aneurysm, dissection, vasculitis or significant stenosis. Veins: No obvious venous abnormality within the limitations of this arterial phase study. Review of the MIP images confirms the above findings. NON-VASCULAR Hepatobiliary: 20 mm cyst within the dome of the liver. No additional focal liver lesion. No gallbladder wall thickening or cholelithiasis. No biliary ductal dilatation. Pancreas: Unremarkable. No pancreatic ductal dilatation or surrounding inflammatory changes. Spleen: Normal in size without focal abnormality. Adrenals/Urinary Tract: Normal adrenal glands. Right kidney interpolar cyst. Left kidney interpolar 11 mm indeterminate hypodense nodule (series 6, image 163). Stomach/Bowel: Stomach is within normal limits. Appendix appears normal. No evidence of bowel wall thickening, distention, or inflammatory changes. Lymphatic: Aortic atherosclerosis. No enlarged abdominal or pelvic lymph nodes. Reproductive: Status post hysterectomy. No adnexal masses. Other: No abdominal wall hernia or abnormality. No abdominopelvic ascites. Musculoskeletal: No fracture is seen. L4-5 PLIF and interbody fusion. Review of the MIP images confirms the above findings. IMPRESSION: 1. No evidence of aortic dissection, aneurysm, vasculitis, or significant stenosis. No significant stenosis or occlusion of the visible iliofemoral arteries. 2. 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 3. Stable small right-sided pleural effusion. 4. Stable mosaic attenuation of the lungs probably representing air trapping and small vessel disease. Interstitial prominence may reflect a component of interstitial edema. 5. Moderate cardiomegaly. 6. Borderline enlarged main pulmonary artery compatible with pulmonary artery hypertension. 7. Aortic  Atherosclerosis (ICD10-I70.0) and Emphysema (ICD10-J43.9). 8. Left kidney 11 mm indeterminate nodule. Further evaluation with renal protocol CT or MRI is recommended on a nonemergent basis. MRI is preferred given the size of the lesion. Electronically Signed   By: Kristine Garbe M.D.   On: 05/06/2018 22:51    Cardiac Studies:  Assessment/Plan:  Severe symptomatic mitral regurgitation. Pulmonary hypertension. 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 Hypertension. Diabetes mellitus. Hyperlipidemia. Osteoarthritis Plan Continue present management.    LOS: 5 days    [name] 05/07/2018, 11:25 AM

## 2018-05-07 NOTE — Progress Notes (Addendum)
Pre-op Cardiac Surgery  Carotid Findings:  Bilateral 1% to 39% Vertebral artery flow is antegrade  Upper Extremity Right Left  Brachial Pressures 111 Triphasic 136 Triphasic  Radial Waveforms Triphasic Triphasic  Ulnar Waveforms Triphasic Biphasic  Palmar Arch (Allen's Test) Normal Normal   Findings:  Doppler waveforms remained normal bilaterally with both radial and ulnar compressions.   Diane [name],RVS 05/07/2018 ,4:08 PM

## 2018-05-07 NOTE — Progress Notes (Signed)
ANTICOAGULATION CONSULT NOTE - Follow Up Consult  Pharmacy Consult for Heparin Indication: atrial fibrillation  Allergies  Allergen Reactions  . Shellfish Allergy Anaphylaxis    Patient Measurements: Height: 5\' 7"  (170.2 cm) Weight: 193 lb 14.4 oz (88 kg) IBW/kg (Calculated) : 61.6 Heparin Dosing Weight: 78 kg  Vital Signs: Temp: 97.6 F (36.4 C) (12/10 0436) Temp Source: Oral (12/10 0436) BP: 131/70 (12/10 0436) Pulse Rate: 60 (12/10 0436)  Labs: Recent Labs    05/05/18 0418 05/06/18 0427 05/06/18 0447 05/06/18 2040 05/07/18 0355  HGB 9.5* 9.5*  --   --  9.0*  HCT 29.7* 29.5*  --   --  28.5*  PLT 157 145*  --   --  152  HEPARINUNFRC 0.52  --  0.47  --  0.38  CREATININE  --   --   --  1.63* 1.55*    Estimated Creatinine Clearance: 36.3 mL/min (A) (by C-G formula based on SCr of 1.55 mg/dL (H)).   Assessment:  Anticoag: Pradaxa PTA for AFib (LD 12/3) - held for cath >> IV heparin while awaiting TCTS consult for severe MVR.CHA2DS2VASc score of 4. HL 0.38. Hgb 9 down slightly. Plts 152 stable overnight.  Goal of Therapy:  Heparin level 0.3-0.7 units/ml Monitor platelets by anticoagulation protocol: Yes   Plan:  Continue heparin at 900 units/hr Monitor daily heparin level and CBC, s/sx bleeding Pradaxa on hold Tentatively plan for minimally invasive mitral valve repair or replacement and Maze procedure on Thursday, May 09, 2018  Crystal S. Alford Highland, PharmD, BCPS Clinical Staff Pharmacist Eilene Ghazi [place] 05/07/2018,9:31 AM

## 2018-05-08 ENCOUNTER — Inpatient Hospital Stay (HOSPITAL_COMMUNITY): Payer: Medicare Other

## 2018-05-08 ENCOUNTER — Ambulatory Visit: Payer: Medicare Other | Admitting: Pulmonary Disease

## 2018-05-08 DIAGNOSIS — I340 Nonrheumatic mitral (valve) insufficiency: Secondary | ICD-10-CM

## 2018-05-08 DIAGNOSIS — I509 Heart failure, unspecified: Secondary | ICD-10-CM

## 2018-05-08 LAB — BASIC METABOLIC PANEL
Anion gap: 8 (ref 5–15)
BUN: 20 mg/dL (ref 8–23)
CO2: 19 mmol/L — ABNORMAL LOW (ref 22–32)
Calcium: 8.5 mg/dL — ABNORMAL LOW (ref 8.9–10.3)
Chloride: 112 mmol/L — ABNORMAL HIGH (ref 98–111)
Creatinine, Ser: 1.47 mg/dL — ABNORMAL HIGH (ref 0.44–1.00)
GFR calc Af Amer: 40 mL/min — ABNORMAL LOW (ref >60)
GFR calc non Af Amer: 35 mL/min — ABNORMAL LOW (ref >60)
Glucose, Bld: 94 mg/dL (ref 70–99)
Potassium: 4.3 mmol/L (ref 3.5–5.1)
Sodium: 139 mmol/L (ref 135–145)

## 2018-05-08 LAB — APTT: aPTT: 81 seconds — ABNORMAL HIGH (ref 24–36)

## 2018-05-08 LAB — HEPARIN LEVEL (UNFRACTIONATED): Heparin Unfractionated: 0.31 IU/mL (ref 0.30–0.70)

## 2018-05-08 LAB — GLUCOSE, CAPILLARY
Glucose-Capillary: 93 mg/dL (ref 70–99)
Glucose-Capillary: 90 mg/dL (ref 70–99)
Glucose-Capillary: 149 mg/dL — ABNORMAL HIGH (ref 70–99)
Glucose-Capillary: 96 mg/dL (ref 70–99)

## 2018-05-08 MED ORDER — DOPAMINE-DEXTROSE 3.2-5 MG/ML-% IV SOLN
0.0000 ug/kg/min | INTRAVENOUS | Status: AC
  Administered 2018-05-09: 3 ug/kg/min via INTRAVENOUS
  Filled 2018-05-08: qty 250

## 2018-05-08 MED ORDER — TRANEXAMIC ACID (OHS) BOLUS VIA INFUSION
15.0000 mg/kg | INTRAVENOUS | Status: AC
  Administered 2018-05-09: 1305 mg via INTRAVENOUS
  Filled 2018-05-08: qty 1305

## 2018-05-08 MED ORDER — KENNESTONE BLOOD CARDIOPLEGIA VIAL
13.0000 mL | Status: DC
  Filled 2018-05-08: qty 1

## 2018-05-08 MED ORDER — SODIUM CHLORIDE 0.9 % IV SOLN
750.0000 mg | INTRAVENOUS | Status: AC
  Administered 2018-05-09: 750 mg via INTRAVENOUS
  Filled 2018-05-08: qty 750

## 2018-05-08 MED ORDER — TRANEXAMIC ACID 1000 MG/10ML IV SOLN
1.5000 mg/kg/h | INTRAVENOUS | Status: AC
  Administered 2018-05-09: 1.5 mg/kg/h via INTRAVENOUS
  Filled 2018-05-08: qty 25

## 2018-05-08 MED ORDER — KENNESTONE BLOOD CARDIOPLEGIA (KBC) MANNITOL SYRINGE (20%, 32ML)
32.0000 mL | INTRAVENOUS | Status: DC
  Filled 2018-05-08: qty 1

## 2018-05-08 MED ORDER — NITROGLYCERIN IN D5W 200-5 MCG/ML-% IV SOLN
2.0000 ug/min | INTRAVENOUS | Status: DC
  Filled 2018-05-08: qty 250

## 2018-05-08 MED ORDER — INSULIN REGULAR(HUMAN) IN NACL 100-0.9 UT/100ML-% IV SOLN
INTRAVENOUS | Status: AC
  Administered 2018-05-09: 1.5 [IU]/h via INTRAVENOUS
  Filled 2018-05-08: qty 100

## 2018-05-08 MED ORDER — BISACODYL 5 MG PO TBEC: 5.0000 mg | DELAYED_RELEASE_TABLET | Freq: Once | ORAL | Status: DC

## 2018-05-08 MED ORDER — EPINEPHRINE PF 1 MG/ML IJ SOLN
0.0000 ug/min | INTRAVENOUS | Status: DC
  Filled 2018-05-08: qty 4

## 2018-05-08 MED ORDER — PHENYLEPHRINE HCL-NACL 20-0.9 MG/250ML-% IV SOLN
30.0000 ug/min | INTRAVENOUS | Status: DC
  Filled 2018-05-08: qty 250

## 2018-05-08 MED ORDER — METOPROLOL TARTRATE 12.5 MG HALF TABLET
12.5000 mg | ORAL_TABLET | Freq: Once | ORAL | Status: AC
  Administered 2018-05-09: 12.5 mg via ORAL
  Filled 2018-05-08: qty 1

## 2018-05-08 MED ORDER — HEPARIN (PORCINE) 25000 UT/250ML-% IV SOLN
900.0000 [IU]/h | INTRAVENOUS | Status: AC
  Administered 2018-05-08: 900 [IU]/h via INTRAVENOUS
  Filled 2018-05-08: qty 250

## 2018-05-08 MED ORDER — CHLORHEXIDINE GLUCONATE 4 % EX LIQD
60.0000 mL | Freq: Once | CUTANEOUS | Status: DC
  Filled 2018-05-08: qty 60

## 2018-05-08 MED ORDER — CHLORHEXIDINE GLUCONATE 4 % EX LIQD
60.0000 mL | Freq: Once | CUTANEOUS | Status: AC
  Administered 2018-05-08: 4 via TOPICAL
  Filled 2018-05-08: qty 60

## 2018-05-08 MED ORDER — TEMAZEPAM 15 MG PO CAPS: 15.0000 mg | ORAL_CAPSULE | Freq: Once | ORAL | Status: DC | PRN

## 2018-05-08 MED ORDER — MAGNESIUM SULFATE 50 % IJ SOLN
40.0000 meq | INTRAMUSCULAR | Status: DC
  Filled 2018-05-08: qty 9.85

## 2018-05-08 MED ORDER — MILRINONE LACTATE IN DEXTROSE 20-5 MG/100ML-% IV SOLN
0.3000 ug/kg/min | INTRAVENOUS | Status: AC
  Administered 2018-05-09: .5 ug/kg/min via INTRAVENOUS
  Filled 2018-05-08: qty 100

## 2018-05-08 MED ORDER — DEXMEDETOMIDINE HCL IN NACL 400 MCG/100ML IV SOLN
0.1000 ug/kg/h | INTRAVENOUS | Status: AC
  Administered 2018-05-09: .5 ug/kg/h via INTRAVENOUS
  Filled 2018-05-08: qty 100

## 2018-05-08 MED ORDER — VANCOMYCIN HCL 10 G IV SOLR
1500.0000 mg | INTRAVENOUS | Status: AC
  Administered 2018-05-09: 1500 mg via INTRAVENOUS
  Filled 2018-05-08: qty 1500

## 2018-05-08 MED ORDER — PLASMA-LYTE 148 IV SOLN
INTRAVENOUS | Status: AC
  Administered 2018-05-09: 500 mL
  Filled 2018-05-08: qty 2.5

## 2018-05-08 MED ORDER — CHLORHEXIDINE GLUCONATE 0.12 % MT SOLN
15.0000 mL | Freq: Once | OROMUCOSAL | Status: AC
  Administered 2018-05-09: 15 mL via OROMUCOSAL
  Filled 2018-05-08: qty 15

## 2018-05-08 MED ORDER — SODIUM CHLORIDE 0.9 % IV SOLN
1.5000 g | INTRAVENOUS | Status: AC
  Administered 2018-05-09: 1.5 g via INTRAVENOUS
  Filled 2018-05-08: qty 1.5

## 2018-05-08 MED ORDER — VANCOMYCIN HCL 1000 MG IV SOLR
INTRAVENOUS | Status: AC
  Administered 2018-05-09: 1000 mL
  Filled 2018-05-08: qty 1000

## 2018-05-08 MED ORDER — POTASSIUM CHLORIDE 2 MEQ/ML IV SOLN
80.0000 meq | INTRAVENOUS | Status: DC
  Filled 2018-05-08: qty 40

## 2018-05-08 MED ORDER — TRANEXAMIC ACID (OHS) PUMP PRIME SOLUTION
2.0000 mg/kg | INTRAVENOUS | Status: DC
  Filled 2018-05-08: qty 1.74

## 2018-05-08 MED ORDER — SODIUM CHLORIDE 0.9 % IV SOLN
INTRAVENOUS | Status: DC
  Filled 2018-05-08: qty 30

## 2018-05-08 NOTE — Progress Notes (Signed)
ANTICOAGULATION CONSULT NOTE - Follow Up Consult  Pharmacy Consult for Heparin Indication: atrial fibrillation  Allergies  Allergen Reactions  . Shellfish Allergy Anaphylaxis    Patient Measurements: Height: 5\' 7"  (170.2 cm) Weight: 191 lb 14.4 oz (87 kg) IBW/kg (Calculated) : 61.6 Heparin Dosing Weight: 78 kg  Vital Signs: Temp: 97.4 F (36.3 C) (12/11 0520) Temp Source: Oral (12/11 0520) BP: 127/76 (12/11 0758) Pulse Rate: 69 (12/11 0520)  Labs: Recent Labs    05/06/18 0427 05/06/18 0447 05/06/18 2040 05/07/18 0355 05/08/18 0439  HGB 9.5*  --   --  9.0*  --   HCT 29.5*  --   --  28.5*  --   PLT 145*  --   --  152  --   HEPARINUNFRC  --  0.47  --  0.38 0.31  CREATININE  --   --  1.63* 1.55* 1.47*    Estimated Creatinine Clearance: 38.1 mL/min (A) (by C-G formula based on SCr of 1.47 mg/dL (H)).   Assessment: Anticoag: Pradaxa PTA for AFib (LD 12/3) - held for cath >> IV heparin while awaiting TCTS consult for severe MVR. CHA2DS2VASc score of 4. HL 0.3 Hgb 9 down slightly (not done 12/11). Plts 152 stable overnight.  Goal of Therapy:  Heparin level 0.3-0.7 units/ml Monitor platelets by anticoagulation protocol: Yes   Plan:  Continue heparin at 900 units/hr Monitor daily heparin level and CBC, s/sx bleeding Pradaxa on hold Tentatively plan for minimally invasive mitral valve repair or replacement and Maze procedure on Thursday, May 09, 2018  Crystal S. Alford Highland, PharmD, BCPS Clinical Staff Pharmacist [place], [place] 05/08/2018,8:10 AM

## 2018-05-08 NOTE — Care Management Important Message (Signed)
Important Message  Patient Details  Name: Diane [name] MRN: [id number] Date of Birth: [birth year]   Medicare Important Message Given:  Yes    Barb Merino Shular 05/08/2018, 12:25 PM

## 2018-05-08 NOTE — Progress Notes (Signed)
[address]       [place] [address]             [phone]     CARDIOTHORACIC SURGERY PROGRESS NOTE  5 Days Post-Op  S/P Procedure(s) (LRB): TRANSESOPHAGEAL ECHOCARDIOGRAM (TEE) (N/A)  Subjective: No complaints  Objective: Vital signs in last 24 hours: Temp:  [97.4 F (36.3 C)-98.4 F (36.9 C)] 98.4 F (36.9 C) (12/11 1635) Pulse Rate:  [58-69] 69 (12/11 0520) Cardiac Rhythm: Atrial fibrillation (12/11 0758) Resp:  [18] 18 (12/11 1635) BP: (119-131)/(42-76) 127/51 (12/11 1635) SpO2:  [95 %-98 %] 95 % (12/11 1635) Weight:  [87 kg] 87 kg (12/11 0520)  Physical Exam:  Rhythm:   Afib  Breath sounds: Bibasilar crackles  Heart sounds:  Irregular w/ systolic murmur  Incisions:  n/a  Abdomen:  soft  Extremities:  Warm, trace LE edema   Intake/Output from previous day: 12/10 0701 - 12/11 0700 In: 294 [P.O.:240; I.V.:54] Out: -  Intake/Output this shift: Total I/O In: 444 [P.O.:444] Out: -   Lab Results: Recent Labs    05/06/18 0427 05/07/18 0355  WBC 5.6 5.6  HGB 9.5* 9.0*  HCT 29.5* 28.5*  PLT 145* 152   BMET:  Recent Labs    05/07/18 0355 05/08/18 0439  NA 139 139  K 3.9 4.3  CL 110 112*  CO2 20* 19*  GLUCOSE 101* 94  BUN 24* 20  CREATININE 1.55* 1.47*  CALCIUM 8.3* 8.5*    CBG (last 3)  Recent Labs    05/08/18 0734 05/08/18 1057 05/08/18 1632  GLUCAP 93 90 149*   PT/INR:  No results for input(s): LABPROT, INR in the last 72 hours.  CXR:  CHEST - 2 VIEW  COMPARISON:  CT chest dated May 06, 2018. Chest x-ray dated May 03, 2018.  FINDINGS: Stable cardiomegaly. Mildly increased interstitial markings. Mild bibasilar atelectasis. Small amount of fluid in the right minor fissure. No focal consolidation, pleural effusion, or pneumothorax. No acute osseous abnormality.  IMPRESSION: Stable cardiomegaly and mild interstitial edema.   Electronically Signed   By: Titus Dubin M.D.   On: 05/08/2018  17:09  Assessment/Plan: S/P Procedure(s) (LRB): TRANSESOPHAGEAL ECHOCARDIOGRAM (TEE) (N/A)  The patient was again counseled at length regarding the indications, risks and potential benefits of mitral valve repair or replacement.  The rationale for elective surgery has been explained, including a comparison between surgery and continued medical therapy with close follow-up.  In the event that her valve cannot be successfully repaired, we discussed the possibility of replacing the mitral valve using a mechanical prosthesis with the attendant need for long-term anticoagulation versus the alternative of replacing it using a bioprosthetic tissue valve with its potential for late structural valve deterioration and failure, depending upon the patient's longevity.  The patient specifically requests that if the mitral valve must be replaced that it be done using a bioprosthetic tissue valve.   Alternative surgical approaches have been discussed including a comparison between conventional sternotomy and minimally-invasive techniques.  The relative risks and benefits of each have been reviewed as they pertain to the patient's specific circumstances, and all of their questions have been addressed.  The relative risks and benefits of performing a maze procedure at the time of her surgery was discussed at length, including the expected likelihood of long term freedom from recurrent symptomatic atrial fibrillation and/or atrial flutter.  The patient additionally provides consent for long term follow up following surgery including participation in the  [employer]-AF database.  The patient understands and accepts all potential risks of surgery including but not limited to risk of death, stroke or other neurologic complication, myocardial infarction, congestive heart failure, respiratory failure, renal failure, bleeding requiring transfusion and/or reexploration, arrhythmia, infection or other wound complications, pneumonia, pleural  and/or pericardial effusion, pulmonary embolus, aortic dissection or other major vascular complication, or delayed complications related to valve repair or replacement including but not limited to structural valve deterioration and failure, thrombosis, embolization, endocarditis, or paravalvular leak.  Specific risks potentially related to the minimally-invasive approach were discussed at length, including but not limited to risk of conversion to full or partial sternotomy, aortic dissection or other major vascular complication, unilateral acute lung injury or pulmonary edema, phrenic nerve dysfunction or paralysis, rib fracture, chronic pain, lung hernia, or lymphocele.   All of her questions have been answered.  For OR tomorrow.   I spent in excess of 15 minutes during the conduct of this hospital encounter and >50% of this time involved direct face-to-face encounter with the patient for counseling and/or coordination of their care.    Rexene Alberts, MD 05/08/2018 6:45 PM

## 2018-05-08 NOTE — Progress Notes (Signed)
Subjective:  Denies and chest pain..  States her breathing has improved.  Objective:  Vital Signs in the last 24 hours: Temp:  [97.4 F (36.3 C)-97.9 F (36.6 C)] 97.4 F (36.3 C) (12/11 0520) Pulse Rate:  [54-69] 69 (12/11 0520) BP: (119-131)/(42-76) 127/76 (12/11 0758) SpO2:  [95 %-98 %] 95 % (12/11 0520) Weight:  [87 kg] 87 kg (12/11 0520)  Intake/Output from previous day: 12/10 0701 - 12/11 0700 In: 294 [P.O.:240; I.V.:54] Out: -  Intake/Output from this shift: No intake/output data recorded.  Physical Exam: Neck: no adenopathy, no carotid bruit, no JVD and supple, symmetrical, trachea midline Lungs: clear to auscultation bilaterally Heart: irregularly irregular rhythm, S1, S2 normal and 2/6 systolic and soft diastolic murmur noted Abdomen: soft, non-tender; bowel sounds normal; no masses,  no organomegaly Extremities: extremities normal, atraumatic, no cyanosis or edema  Lab Results: Recent Labs    05/06/18 0427 05/07/18 0355  WBC 5.6 5.6  HGB 9.5* 9.0*  PLT 145* 152   Recent Labs    05/07/18 0355 05/08/18 0439  NA 139 139  K 3.9 4.3  CL 110 112*  CO2 20* 19*  GLUCOSE 101* 94  BUN 24* 20  CREATININE 1.55* 1.47*   No results for input(s): TROPONINI in the last 72 hours.  Invalid input(s): CK, MB Hepatic Function Panel Recent Labs    05/07/18 0355  PROT 6.0*  ALBUMIN 2.9*  AST 36  ALT 37  ALKPHOS 70  BILITOT 0.3   No results for input(s): CHOL in the last 72 hours. No results for input(s): PROTIME in the last 72 hours.  Imaging: Imaging results have been reviewed and Ct Angio Chest/abd/pel For Dissection W And/or W/wo  Result Date: 05/06/2018 CLINICAL DATA:  77 y/o F; Aortic disease, nontraumatic, known or suspected r/o aortic dissection and aortoiliac occlussive disease. EXAM: CT ANGIOGRAPHY CHEST, ABDOMEN AND PELVIS TECHNIQUE: Multidetector CT imaging through the chest, abdomen and pelvis was performed using the standard protocol during bolus  administration of intravenous contrast. Multiplanar reconstructed images and MIPs were obtained and reviewed to evaluate the vascular anatomy. CONTRAST:  164mL ISOVUE-370 IOPAMIDOL (ISOVUE-370) INJECTION 76% COMPARISON:  12/06/2017 CT chest. FINDINGS: CTA CHEST FINDINGS Cardiovascular: Moderate cardiomegaly. Mild coronary artery and aortic calcific atherosclerosis. No pericardial effusion. Normal caliber thoracic aorta. Satisfactory opacification of the thoracic aorta. No aortic dissection, aneurysm, or findings of vasculitis. Borderline enlarged main pulmonary artery compatible with pulmonary artery hypertension in the appropriate clinical setting. Satisfactory opacification of pulmonary arteries. No pulmonary embolus identified. Mediastinum/Nodes: No enlarged mediastinal, hilar, or axillary lymph nodes. Thyroid gland, trachea, and esophagus demonstrate no significant findings. Lungs/Pleura: Mild centrilobular emphysema with upper lobe predominance. Mosaic ground-glass opacities of the lung similar to prior study. Small right-sided pleural effusion. Few 2-3 mm pulmonary nodules within the right lower lobe are new from the prior CT, likely infectious or inflammatory. Mild bronchiectasis in the left lung base. Mild interlobular septal thickening. Musculoskeletal: No acute fracture. Review of the MIP images confirms the above findings. CTA ABDOMEN AND PELVIS FINDINGS VASCULAR Aorta: Normal caliber aorta without aneurysm, dissection, vasculitis or significant stenosis. 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 SMA: Patent without evidence of aneurysm, dissection, vasculitis or significant stenosis. Renals: Both renal arteries are patent without evidence of aneurysm, dissection, vasculitis, fibromuscular dysplasia or significant stenosis. Accessory right renal artery. IMA: Calcified plaque of the proximal IMA. Vessel size diminutive, suboptimal to quantify stenosis  period. Inflow: Patent without evidence of aneurysm,  dissection, vasculitis or significant stenosis. Veins: No obvious venous abnormality within the limitations of this arterial phase study. Review of the MIP images confirms the above findings. NON-VASCULAR Hepatobiliary: 20 mm cyst within the dome of the liver. No additional focal liver lesion. No gallbladder wall thickening or cholelithiasis. No biliary ductal dilatation. Pancreas: Unremarkable. No pancreatic ductal dilatation or surrounding inflammatory changes. Spleen: Normal in size without focal abnormality. Adrenals/Urinary Tract: Normal adrenal glands. Right kidney interpolar cyst. Left kidney interpolar 11 mm indeterminate hypodense nodule (series 6, image 163). Stomach/Bowel: Stomach is within normal limits. Appendix appears normal. No evidence of bowel wall thickening, distention, or inflammatory changes. Lymphatic: Aortic atherosclerosis. No enlarged abdominal or pelvic lymph nodes. Reproductive: Status post hysterectomy. No adnexal masses. Other: No abdominal wall hernia or abnormality. No abdominopelvic ascites. Musculoskeletal: No fracture is seen. L4-5 PLIF and interbody fusion. Review of the MIP images confirms the above findings. IMPRESSION: 1. No evidence of aortic dissection, aneurysm, vasculitis, or significant stenosis. No significant stenosis or occlusion of the visible iliofemoral arteries. 2. 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 3. Stable small right-sided pleural effusion. 4. Stable mosaic attenuation of the lungs probably representing air trapping and small vessel disease. Interstitial prominence may reflect a component of interstitial edema. 5. Moderate cardiomegaly. 6. Borderline enlarged main pulmonary artery compatible with pulmonary artery hypertension. 7. Aortic Atherosclerosis (ICD10-I70.0) and Emphysema (ICD10-J43.9). 8. Left kidney 11 mm indeterminate nodule. Further evaluation with renal protocol CT or MRI is  recommended on a nonemergent basis. MRI is preferred given the size of the lesion. Electronically Signed   By: Kristine Garbe M.D.   On: 05/06/2018 22:51   Vas US Doppler Pre Cabg  Result Date: 05/07/2018 PREOPERATIVE VASCULAR EVALUATION  Indications: Pre MVR. Performing Technologist: Birdena Crandall, [place] [facility]  Examination Guidelines: A complete evaluation includes B-mode imaging, spectral Doppler, color Doppler, and power Doppler as needed of all accessible portions of each vessel. Bilateral testing is considered an integral part of a complete examination. Limited examinations for reoccurring indications may be performed as noted.  Right Carotid Findings: +----------+--------+--------+--------+------------+-------------------------+           PSV cm/sEDV cm/sStenosisDescribe    Comments                  +----------+--------+--------+--------+------------+-------------------------+ CCA Prox  73      9                                                     +----------+--------+--------+--------+------------+-------------------------+ CCA Distal54      7                           mild intimal changes      +----------+--------+--------+--------+------------+-------------------------+ ICA Prox  58      9               heterogenousmild plaque at the origin +----------+--------+--------+--------+------------+-------------------------+ ICA Mid   62      14                                                    +----------+--------+--------+--------+------------+-------------------------+ ICA Distal91      27                                                    +----------+--------+--------+--------+------------+-------------------------+  ECA       77      7                                                     +----------+--------+--------+--------+------------+-------------------------+ Portions of this table do not appear on this page.  +----------+--------+-------+--------+------------+           PSV cm/sEDV cmsDescribeArm Pressure +----------+--------+-------+--------+------------+ Subclavian129                    111          +----------+--------+-------+--------+------------+ +---------+--------+--+--------+--+ VertebralPSV cm/s62EDV cm/s14 +---------+--------+--+--------+--+ Left Carotid Findings: +----------+--------+--------+--------+------------+--------------------+           PSV cm/sEDV cm/sStenosisDescribe    Comments             +----------+--------+--------+--------+------------+--------------------+ CCA Prox  106     14                                               +----------+--------+--------+--------+------------+--------------------+ CCA Distal76      9                           mild intimal changes +----------+--------+--------+--------+------------+--------------------+ ICA Prox  72      11              heterogenousmild plaque          +----------+--------+--------+--------+------------+--------------------+ ICA Mid   82      15                                               +----------+--------+--------+--------+------------+--------------------+ ICA Distal81      16                                               +----------+--------+--------+--------+------------+--------------------+ ECA       83      2               heterogenousmild plaque          +----------+--------+--------+--------+------------+--------------------+ +----------+--------+--------+--------+------------+ SubclavianPSV cm/sEDV cm/sDescribeArm Pressure +----------+--------+--------+--------+------------+           83                      136          +----------+--------+--------+--------+------------+ +---------+--------+--+--------+--+ VertebralPSV cm/s86EDV cm/s20 +---------+--------+--+--------+--+  ABI Findings: +--------+------------------+-----+---------+--------+  Right   Rt Pressure (mmHg)IndexWaveform Comment  +--------+------------------+-----+---------+--------+ [id number]                    triphasic         +--------+------------------+-----+---------+--------+ +--------+------------------+-----+---------+-------+ Left    Lt Pressure (mmHg)IndexWaveform Comment +--------+------------------+-----+---------+-------+ [id number]                    triphasic        +--------+------------------+-----+---------+-------+  Right Doppler Findings: +-----------+--------+-----+---------+--------+ Site       PressureIndexDoppler  Comments +-----------+--------+-----+---------+--------+ Brachial  111          triphasic         +-----------+--------+-----+---------+--------+ Radial                  triphasic         +-----------+--------+-----+---------+--------+ Ulnar                   triphasic         +-----------+--------+-----+---------+--------+ Palmar Arch                      Normal   +-----------+--------+-----+---------+--------+  Left Doppler Findings: +-----------+--------+-----+---------+--------+ Site       PressureIndexDoppler  Comments +-----------+--------+-----+---------+--------+ Brachial   136          triphasic         +-----------+--------+-----+---------+--------+ Radial                  triphasic         +-----------+--------+-----+---------+--------+ Ulnar                   triphasic         +-----------+--------+-----+---------+--------+ Palmar Arch                      normal   +-----------+--------+-----+---------+--------+  Summary: Right Carotid: Velocities in the right ICA are consistent with a 1-39% stenosis. Left Carotid: Velocities in the left ICA are consistent with a 1-39% stenosis. Vertebrals:  Bilateral vertebral arteries demonstrate antegrade flow. Subclavians: Normal flow hemodynamics were seen in bilateral subclavian              arteries. Right Upper  Extremity: Doppler waveforms remain within normal limits with right radial compression. Doppler waveforms remain within normal limits with right ulnar compression. Left Upper Extremity: Doppler waveforms remain within normal limits with left radial compression. Doppler waveforms remain within normal limits with left ulnar compression.  Electronically signed by Deitra Mayo MD on 05/07/2018 at 5:11:02 PM.    Final     Cardiac Studies:  Assessment/Plan:  Severe symptomatic mitral regurgitation. Pulmonary hypertension. 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 Hypertension. Diabetes mellitus. Hyperlipidemia. Osteoarthritis Plan Continue present management. Encouraged incentive  Spirometry Scheduled for MVR/maze procedure in a.m.  LOS: 6 days    [name] 05/08/2018, 11:55 AM

## 2018-05-09 ENCOUNTER — Inpatient Hospital Stay (HOSPITAL_COMMUNITY): Payer: Medicare Other

## 2018-05-09 ENCOUNTER — Observation Stay (HOSPITAL_COMMUNITY): Payer: Medicare Other

## 2018-05-09 ENCOUNTER — Other Ambulatory Visit: Payer: Self-pay

## 2018-05-09 ENCOUNTER — Observation Stay (HOSPITAL_COMMUNITY): Payer: Medicare Other | Admitting: Certified Registered Nurse Anesthetist

## 2018-05-09 ENCOUNTER — Encounter (HOSPITAL_COMMUNITY)
Admission: AD | Disposition: A | Discharge status: Home Health | Payer: Self-pay | Source: Ambulatory Visit | Attending: Thoracic Surgery (Cardiothoracic Vascular Surgery)

## 2018-05-09 DIAGNOSIS — E1122 Type 2 diabetes mellitus with diabetic chronic kidney disease: Secondary | ICD-10-CM | POA: Diagnosis present

## 2018-05-09 DIAGNOSIS — K0530 Chronic periodontitis, unspecified: Secondary | ICD-10-CM | POA: Diagnosis present

## 2018-05-09 DIAGNOSIS — E785 Hyperlipidemia, unspecified: Secondary | ICD-10-CM | POA: Diagnosis present

## 2018-05-09 DIAGNOSIS — Z9889 Other specified postprocedural states: Secondary | ICD-10-CM

## 2018-05-09 DIAGNOSIS — I2510 Atherosclerotic heart disease of native coronary artery without angina pectoris: Secondary | ICD-10-CM | POA: Diagnosis present

## 2018-05-09 DIAGNOSIS — D62 Acute posthemorrhagic anemia: Secondary | ICD-10-CM | POA: Diagnosis not present

## 2018-05-09 DIAGNOSIS — K029 Dental caries, unspecified: Secondary | ICD-10-CM | POA: Diagnosis present

## 2018-05-09 DIAGNOSIS — I340 Nonrheumatic mitral (valve) insufficiency: Secondary | ICD-10-CM | POA: Diagnosis not present

## 2018-05-09 DIAGNOSIS — M1990 Unspecified osteoarthritis, unspecified site: Secondary | ICD-10-CM | POA: Diagnosis present

## 2018-05-09 DIAGNOSIS — I313 Pericardial effusion (noninflammatory): Secondary | ICD-10-CM | POA: Diagnosis present

## 2018-05-09 DIAGNOSIS — I130 Hypertensive heart and chronic kidney disease with heart failure and stage 1 through stage 4 chronic kidney disease, or unspecified chronic kidney disease: Secondary | ICD-10-CM | POA: Diagnosis present

## 2018-05-09 DIAGNOSIS — Z8679 Personal history of other diseases of the circulatory system: Secondary | ICD-10-CM

## 2018-05-09 DIAGNOSIS — M797 Fibromyalgia: Secondary | ICD-10-CM | POA: Diagnosis present

## 2018-05-09 DIAGNOSIS — I5033 Acute on chronic diastolic (congestive) heart failure: Secondary | ICD-10-CM | POA: Diagnosis present

## 2018-05-09 DIAGNOSIS — I4811 Longstanding persistent atrial fibrillation: Secondary | ICD-10-CM | POA: Diagnosis present

## 2018-05-09 DIAGNOSIS — D638 Anemia in other chronic diseases classified elsewhere: Secondary | ICD-10-CM | POA: Diagnosis present

## 2018-05-09 DIAGNOSIS — J9811 Atelectasis: Secondary | ICD-10-CM | POA: Diagnosis present

## 2018-05-09 DIAGNOSIS — N939 Abnormal uterine and vaginal bleeding, unspecified: Secondary | ICD-10-CM | POA: Diagnosis present

## 2018-05-09 DIAGNOSIS — Z7901 Long term (current) use of anticoagulants: Secondary | ICD-10-CM | POA: Diagnosis not present

## 2018-05-09 DIAGNOSIS — E669 Obesity, unspecified: Secondary | ICD-10-CM | POA: Diagnosis present

## 2018-05-09 DIAGNOSIS — N183 Chronic kidney disease, stage 3 (moderate): Secondary | ICD-10-CM | POA: Diagnosis present

## 2018-05-09 DIAGNOSIS — I454 Nonspecific intraventricular block: Secondary | ICD-10-CM | POA: Diagnosis present

## 2018-05-09 DIAGNOSIS — I272 Pulmonary hypertension, unspecified: Secondary | ICD-10-CM | POA: Diagnosis present

## 2018-05-09 DIAGNOSIS — I083 Combined rheumatic disorders of mitral, aortic and tricuspid valves: Secondary | ICD-10-CM | POA: Diagnosis present

## 2018-05-09 DIAGNOSIS — Z7984 Long term (current) use of oral hypoglycemic drugs: Secondary | ICD-10-CM | POA: Diagnosis not present

## 2018-05-09 DIAGNOSIS — I428 Other cardiomyopathies: Secondary | ICD-10-CM | POA: Diagnosis present

## 2018-05-09 DIAGNOSIS — J441 Chronic obstructive pulmonary disease with (acute) exacerbation: Secondary | ICD-10-CM | POA: Diagnosis present

## 2018-05-09 HISTORY — DX: Personal history of other diseases of the circulatory system: Z86.79

## 2018-05-09 HISTORY — DX: Other specified postprocedural states: Z98.890

## 2018-05-09 HISTORY — PX: TRICUSPID VALVE REPLACEMENT: SHX816

## 2018-05-09 HISTORY — PX: CLIPPING OF ATRIAL APPENDAGE: SHX5773

## 2018-05-09 HISTORY — PX: MITRAL VALVE REPAIR: SHX2039

## 2018-05-09 HISTORY — PX: TEE WITHOUT CARDIOVERSION: SHX5443

## 2018-05-09 HISTORY — PX: MAZE: SHX5063

## 2018-05-09 LAB — POCT I-STAT, CHEM 8
BUN: 19 mg/dL (ref 8–23)
BUN: 17 mg/dL (ref 8–23)
BUN: 18 mg/dL (ref 8–23)
BUN: 22 mg/dL (ref 8–23)
BUN: 18 mg/dL (ref 8–23)
BUN: 18 mg/dL (ref 8–23)
BUN: 15 mg/dL (ref 8–23)
Calcium, Ion: 1.29 mmol/L (ref 1.15–1.40)
Calcium, Ion: 1.07 mmol/L — ABNORMAL LOW (ref 1.15–1.40)
Calcium, Ion: 1.21 mmol/L (ref 1.15–1.40)
Calcium, Ion: 1.13 mmol/L — ABNORMAL LOW (ref 1.15–1.40)
Calcium, Ion: 1.12 mmol/L — ABNORMAL LOW (ref 1.15–1.40)
Calcium, Ion: 1.09 mmol/L — ABNORMAL LOW (ref 1.15–1.40)
Calcium, Ion: 1.07 mmol/L — ABNORMAL LOW (ref 1.15–1.40)
Chloride: 110 mmol/L (ref 98–111)
Chloride: 109 mmol/L (ref 98–111)
Chloride: 110 mmol/L (ref 98–111)
Chloride: 109 mmol/L (ref 98–111)
Chloride: 110 mmol/L (ref 98–111)
Chloride: 110 mmol/L (ref 98–111)
Chloride: 109 mmol/L (ref 98–111)
Creatinine, Ser: 1.3 mg/dL — ABNORMAL HIGH (ref 0.44–1.00)
Creatinine, Ser: 1.1 mg/dL — ABNORMAL HIGH (ref 0.44–1.00)
Creatinine, Ser: 1.4 mg/dL — ABNORMAL HIGH (ref 0.44–1.00)
Creatinine, Ser: 1.1 mg/dL — ABNORMAL HIGH (ref 0.44–1.00)
Creatinine, Ser: 1.1 mg/dL — ABNORMAL HIGH (ref 0.44–1.00)
Creatinine, Ser: 1.1 mg/dL — ABNORMAL HIGH (ref 0.44–1.00)
Creatinine, Ser: 1 mg/dL (ref 0.44–1.00)
Glucose, Bld: 136 mg/dL — ABNORMAL HIGH (ref 70–99)
Glucose, Bld: 115 mg/dL — ABNORMAL HIGH (ref 70–99)
Glucose, Bld: 132 mg/dL — ABNORMAL HIGH (ref 70–99)
Glucose, Bld: 130 mg/dL — ABNORMAL HIGH (ref 70–99)
Glucose, Bld: 142 mg/dL — ABNORMAL HIGH (ref 70–99)
Glucose, Bld: 148 mg/dL — ABNORMAL HIGH (ref 70–99)
Glucose, Bld: 124 mg/dL — ABNORMAL HIGH (ref 70–99)
HCT: 29 % — ABNORMAL LOW (ref 36.0–46.0)
HCT: 23 % — ABNORMAL LOW (ref 36.0–46.0)
HCT: 28 % — ABNORMAL LOW (ref 36.0–46.0)
HCT: 25 % — ABNORMAL LOW (ref 36.0–46.0)
HCT: 23 % — ABNORMAL LOW (ref 36.0–46.0)
HCT: 23 % — ABNORMAL LOW (ref 36.0–46.0)
HCT: 31 % — ABNORMAL LOW (ref 36.0–46.0)
Hemoglobin: 9.9 g/dL — ABNORMAL LOW (ref 12.0–15.0)
Hemoglobin: 7.8 g/dL — ABNORMAL LOW (ref 12.0–15.0)
Hemoglobin: 9.5 g/dL — ABNORMAL LOW (ref 12.0–15.0)
Hemoglobin: 8.5 g/dL — ABNORMAL LOW (ref 12.0–15.0)
Hemoglobin: 7.8 g/dL — ABNORMAL LOW (ref 12.0–15.0)
Hemoglobin: 7.8 g/dL — ABNORMAL LOW (ref 12.0–15.0)
Hemoglobin: 10.5 g/dL — ABNORMAL LOW (ref 12.0–15.0)
Potassium: 4.5 mmol/L (ref 3.5–5.1)
Potassium: 4.4 mmol/L (ref 3.5–5.1)
Potassium: 4.5 mmol/L (ref 3.5–5.1)
Potassium: 4.7 mmol/L (ref 3.5–5.1)
Potassium: 5 mmol/L (ref 3.5–5.1)
Potassium: 4.4 mmol/L (ref 3.5–5.1)
Potassium: 4.1 mmol/L (ref 3.5–5.1)
Sodium: 140 mmol/L (ref 135–145)
Sodium: 140 mmol/L (ref 135–145)
Sodium: 140 mmol/L (ref 135–145)
Sodium: 140 mmol/L (ref 135–145)
Sodium: 140 mmol/L (ref 135–145)
Sodium: 140 mmol/L (ref 135–145)
Sodium: 141 mmol/L (ref 135–145)
TCO2: 23 mmol/L (ref 22–32)
TCO2: 21 mmol/L — ABNORMAL LOW (ref 22–32)
TCO2: 24 mmol/L (ref 22–32)
TCO2: 25 mmol/L (ref 22–32)
TCO2: 24 mmol/L (ref 22–32)
TCO2: 25 mmol/L (ref 22–32)
TCO2: 22 mmol/L (ref 22–32)

## 2018-05-09 LAB — POCT I-STAT 3, ART BLOOD GAS (G3+)
Acid-base deficit: 4 mmol/L — ABNORMAL HIGH (ref 0.0–2.0)
Acid-base deficit: 3 mmol/L — ABNORMAL HIGH (ref 0.0–2.0)
Acid-base deficit: 8 mmol/L — ABNORMAL HIGH (ref 0.0–2.0)
Bicarbonate: 21.2 mmol/L (ref 20.0–28.0)
Bicarbonate: 21.7 mmol/L (ref 20.0–28.0)
Bicarbonate: 18.2 mmol/L — ABNORMAL LOW (ref 20.0–28.0)
O2 Saturation: 100 %
O2 Saturation: 100 %
O2 Saturation: 91 %
Patient temperature: 35.2
TCO2: 22 mmol/L (ref 22–32)
TCO2: 23 mmol/L (ref 22–32)
TCO2: 19 mmol/L — ABNORMAL LOW (ref 22–32)
pCO2 arterial: 37.6 mmHg (ref 32.0–48.0)
pCO2 arterial: 38.5 mmHg (ref 32.0–48.0)
pCO2 arterial: 35.5 mmHg (ref 32.0–48.0)
pH, Arterial: 7.359 (ref 7.350–7.450)
pH, Arterial: 7.359 (ref 7.350–7.450)
pH, Arterial: 7.309 — ABNORMAL LOW (ref 7.350–7.450)
pO2, Arterial: 408 mmHg — ABNORMAL HIGH (ref 83.0–108.0)
pO2, Arterial: 435 mmHg — ABNORMAL HIGH (ref 83.0–108.0)
pO2, Arterial: 59 mmHg — ABNORMAL LOW (ref 83.0–108.0)

## 2018-05-09 LAB — CBC
HCT: 28.4 % — ABNORMAL LOW (ref 36.0–46.0)
HCT: 23.1 % — ABNORMAL LOW (ref 36.0–46.0)
HCT: 28.5 % — ABNORMAL LOW (ref 36.0–46.0)
Hemoglobin: 8.7 g/dL — ABNORMAL LOW (ref 12.0–15.0)
Hemoglobin: 7.2 g/dL — ABNORMAL LOW (ref 12.0–15.0)
Hemoglobin: 9.2 g/dL — ABNORMAL LOW (ref 12.0–15.0)
MCH: 25 pg — ABNORMAL LOW (ref 26.0–34.0)
MCH: 25.9 pg — ABNORMAL LOW (ref 26.0–34.0)
MCH: 26.7 pg (ref 26.0–34.0)
MCHC: 30.6 g/dL (ref 30.0–36.0)
MCHC: 31.2 g/dL (ref 30.0–36.0)
MCHC: 32.3 g/dL (ref 30.0–36.0)
MCV: 81.6 fL (ref 80.0–100.0)
MCV: 83.1 fL (ref 80.0–100.0)
MCV: 82.6 fL (ref 80.0–100.0)
Platelets: 138 10*3/uL — ABNORMAL LOW (ref 150–400)
Platelets: 96 10*3/uL — ABNORMAL LOW (ref 150–400)
Platelets: 145 10*3/uL — ABNORMAL LOW (ref 150–400)
RBC: 3.48 MIL/uL — ABNORMAL LOW (ref 3.87–5.11)
RBC: 2.78 MIL/uL — ABNORMAL LOW (ref 3.87–5.11)
RBC: 3.45 MIL/uL — ABNORMAL LOW (ref 3.87–5.11)
RDW: 17.3 % — ABNORMAL HIGH (ref 11.5–15.5)
RDW: 17.2 % — ABNORMAL HIGH (ref 11.5–15.5)
RDW: 15.7 % — ABNORMAL HIGH (ref 11.5–15.5)
WBC: 5.4 10*3/uL (ref 4.0–10.5)
WBC: 11.9 10*3/uL — ABNORMAL HIGH (ref 4.0–10.5)
WBC: 9.9 10*3/uL (ref 4.0–10.5)
nRBC: 0 % (ref 0.0–0.2)
nRBC: 0 % (ref 0.0–0.2)
nRBC: 0 % (ref 0.0–0.2)

## 2018-05-09 LAB — GLUCOSE, CAPILLARY
Glucose-Capillary: 119 mg/dL — ABNORMAL HIGH (ref 70–99)
Glucose-Capillary: 118 mg/dL — ABNORMAL HIGH (ref 70–99)
Glucose-Capillary: 109 mg/dL — ABNORMAL HIGH (ref 70–99)
Glucose-Capillary: 121 mg/dL — ABNORMAL HIGH (ref 70–99)
Glucose-Capillary: 116 mg/dL — ABNORMAL HIGH (ref 70–99)
Glucose-Capillary: 127 mg/dL — ABNORMAL HIGH (ref 70–99)
Glucose-Capillary: 132 mg/dL — ABNORMAL HIGH (ref 70–99)
Glucose-Capillary: 137 mg/dL — ABNORMAL HIGH (ref 70–99)
Glucose-Capillary: 124 mg/dL — ABNORMAL HIGH (ref 70–99)

## 2018-05-09 LAB — POCT I-STAT 4, (NA,K, GLUC, HGB,HCT)
Glucose, Bld: 119 mg/dL — ABNORMAL HIGH (ref 70–99)
HCT: 22 % — ABNORMAL LOW (ref 36.0–46.0)
Hemoglobin: 7.5 g/dL — ABNORMAL LOW (ref 12.0–15.0)
Potassium: 4.3 mmol/L (ref 3.5–5.1)
Sodium: 141 mmol/L (ref 135–145)

## 2018-05-09 LAB — PREPARE RBC (CROSSMATCH)

## 2018-05-09 LAB — PROTIME-INR
INR: 1.88
Prothrombin Time: 21.4 seconds — ABNORMAL HIGH (ref 11.4–15.2)

## 2018-05-09 LAB — BASIC METABOLIC PANEL
Anion gap: 8 (ref 5–15)
BUN: 19 mg/dL (ref 8–23)
CO2: 18 mmol/L — ABNORMAL LOW (ref 22–32)
Calcium: 8.4 mg/dL — ABNORMAL LOW (ref 8.9–10.3)
Chloride: 112 mmol/L — ABNORMAL HIGH (ref 98–111)
Creatinine, Ser: 1.43 mg/dL — ABNORMAL HIGH (ref 0.44–1.00)
GFR calc Af Amer: 42 mL/min — ABNORMAL LOW (ref >60)
GFR calc non Af Amer: 36 mL/min — ABNORMAL LOW (ref >60)
Glucose, Bld: 95 mg/dL (ref 70–99)
Potassium: 4.4 mmol/L (ref 3.5–5.1)
Sodium: 138 mmol/L (ref 135–145)

## 2018-05-09 LAB — MAGNESIUM: Magnesium: 3.3 mg/dL — ABNORMAL HIGH (ref 1.7–2.4)

## 2018-05-09 LAB — CREATININE, SERUM
Creatinine, Ser: 1.08 mg/dL — ABNORMAL HIGH (ref 0.44–1.00)
GFR calc Af Amer: 59 mL/min — ABNORMAL LOW (ref >60)
GFR calc non Af Amer: 51 mL/min — ABNORMAL LOW (ref >60)

## 2018-05-09 LAB — HEMOGLOBIN AND HEMATOCRIT, BLOOD
HCT: 20.6 % — ABNORMAL LOW (ref 36.0–46.0)
Hemoglobin: 6.4 g/dL — CL (ref 12.0–15.0)

## 2018-05-09 LAB — APTT: aPTT: 39 seconds — ABNORMAL HIGH (ref 24–36)

## 2018-05-09 LAB — PLATELET COUNT: Platelets: 102 10*3/uL — ABNORMAL LOW (ref 150–400)

## 2018-05-09 SURGERY — ECHOCARDIOGRAM, TRANSESOPHAGEAL
Anesthesia: General | Site: Chest

## 2018-05-09 MED ORDER — SODIUM CHLORIDE 0.45 % IV SOLN
INTRAVENOUS | Status: DC | PRN
  Administered 2018-05-09: 15:00:00 via INTRAVENOUS

## 2018-05-09 MED ORDER — MAGNESIUM SULFATE 4 GM/100ML IV SOLN
4.0000 g | Freq: Once | INTRAVENOUS | Status: AC
  Administered 2018-05-09: 4 g via INTRAVENOUS
  Filled 2018-05-09: qty 100

## 2018-05-09 MED ORDER — DEXMEDETOMIDINE HCL IN NACL 200 MCG/50ML IV SOLN
INTRAVENOUS | Status: AC
  Filled 2018-05-09: qty 50

## 2018-05-09 MED ORDER — PHENYLEPHRINE 40 MCG/ML (10ML) SYRINGE FOR IV PUSH (FOR BLOOD PRESSURE SUPPORT)
PREFILLED_SYRINGE | INTRAVENOUS | Status: AC
  Filled 2018-05-09: qty 10

## 2018-05-09 MED ORDER — LACTATED RINGERS IV SOLN
INTRAVENOUS | Status: DC
  Administered 2018-05-11: 13:00:00 via INTRAVENOUS

## 2018-05-09 MED ORDER — ORAL CARE MOUTH RINSE
15.0000 mL | OROMUCOSAL | Status: DC
  Administered 2018-05-09 – 2018-05-10 (×3): 15 mL via OROMUCOSAL

## 2018-05-09 MED ORDER — SODIUM BICARBONATE 8.4 % IV SOLN
100.0000 meq | Freq: Once | INTRAVENOUS | Status: AC
  Administered 2018-05-09: 100 meq via INTRAVENOUS

## 2018-05-09 MED ORDER — ROCURONIUM BROMIDE 50 MG/5ML IV SOSY
PREFILLED_SYRINGE | INTRAVENOUS | Status: AC
  Filled 2018-05-09: qty 10

## 2018-05-09 MED ORDER — MORPHINE SULFATE (PF) 2 MG/ML IV SOLN: 1.0000 mg | INTRAVENOUS | Status: DC | PRN

## 2018-05-09 MED ORDER — LACTATED RINGERS IV SOLN
INTRAVENOUS | Status: DC
  Administered 2018-05-09: 15:00:00 via INTRAVENOUS

## 2018-05-09 MED ORDER — FAMOTIDINE IN NACL 20-0.9 MG/50ML-% IV SOLN
20.0000 mg | Freq: Two times a day (BID) | INTRAVENOUS | Status: AC
  Administered 2018-05-09 (×2): 20 mg via INTRAVENOUS
  Filled 2018-05-09: qty 50

## 2018-05-09 MED ORDER — ACETAMINOPHEN 160 MG/5ML PO SOLN
1000.0000 mg | Freq: Four times a day (QID) | ORAL | Status: DC
  Administered 2018-05-09: 1000 mg
  Filled 2018-05-09: qty 40.6

## 2018-05-09 MED ORDER — CHLORHEXIDINE GLUCONATE 0.12% ORAL RINSE (MEDLINE KIT)
15.0000 mL | Freq: Two times a day (BID) | OROMUCOSAL | Status: DC
  Administered 2018-05-09: 15 mL via OROMUCOSAL

## 2018-05-09 MED ORDER — 0.9 % SODIUM CHLORIDE (POUR BTL) OPTIME
TOPICAL | Status: DC | PRN
  Administered 2018-05-09: 5000 mL

## 2018-05-09 MED ORDER — MILRINONE LACTATE IN DEXTROSE 20-5 MG/100ML-% IV SOLN
0.0000 ug/kg/min | INTRAVENOUS | Status: DC
  Administered 2018-05-09 – 2018-05-10 (×3): 0.5 ug/kg/min via INTRAVENOUS
  Filled 2018-05-09: qty 100
  Filled 2018-05-09: qty 200

## 2018-05-09 MED ORDER — TRAMADOL HCL 50 MG PO TABS
50.0000 mg | ORAL_TABLET | ORAL | Status: DC | PRN
  Administered 2018-05-10 – 2018-05-11 (×2): 50 mg via ORAL
  Administered 2018-05-17 – 2018-05-20 (×4): 100 mg via ORAL
  Filled 2018-05-09 (×4): qty 2
  Filled 2018-05-09 (×2): qty 1
  Filled 2018-05-09: qty 2

## 2018-05-09 MED ORDER — VANCOMYCIN HCL IN DEXTROSE 1-5 GM/200ML-% IV SOLN
1000.0000 mg | Freq: Once | INTRAVENOUS | Status: AC
  Administered 2018-05-09: 1000 mg via INTRAVENOUS
  Filled 2018-05-09: qty 200

## 2018-05-09 MED ORDER — BISACODYL 10 MG RE SUPP
10.0000 mg | Freq: Every day | RECTAL | Status: DC
  Filled 2018-05-09: qty 1

## 2018-05-09 MED ORDER — INSULIN REGULAR(HUMAN) IN NACL 100-0.9 UT/100ML-% IV SOLN: INTRAVENOUS | Status: DC

## 2018-05-09 MED ORDER — LACTATED RINGERS IV SOLN
INTRAVENOUS | Status: DC | PRN
  Administered 2018-05-09 (×2): via INTRAVENOUS

## 2018-05-09 MED ORDER — PROPOFOL 10 MG/ML IV BOLUS
INTRAVENOUS | Status: DC | PRN
  Administered 2018-05-09: 20 mg via INTRAVENOUS
  Administered 2018-05-09: 50 mg via INTRAVENOUS

## 2018-05-09 MED ORDER — POTASSIUM CHLORIDE 10 MEQ/50ML IV SOLN: 10.0000 meq | INTRAVENOUS | Status: AC

## 2018-05-09 MED ORDER — CHLORHEXIDINE GLUCONATE 0.12 % MT SOLN
15.0000 mL | OROMUCOSAL | Status: AC
  Administered 2018-05-09: 15 mL via OROMUCOSAL

## 2018-05-09 MED ORDER — HEPARIN SODIUM (PORCINE) 1000 UNIT/ML IJ SOLN
INTRAMUSCULAR | Status: AC
  Filled 2018-05-09: qty 1

## 2018-05-09 MED ORDER — SODIUM CHLORIDE 0.9% FLUSH: 3.0000 mL | INTRAVENOUS | Status: DC | PRN

## 2018-05-09 MED ORDER — ALBUMIN HUMAN 5 % IV SOLN
INTRAVENOUS | Status: DC | PRN
  Administered 2018-05-09 (×2): via INTRAVENOUS

## 2018-05-09 MED ORDER — SODIUM CHLORIDE 0.9 % IV SOLN
INTRAVENOUS | Status: DC | PRN
  Administered 2018-05-09: 30 ug/min via INTRAVENOUS

## 2018-05-09 MED ORDER — EPHEDRINE SULFATE-NACL 50-0.9 MG/10ML-% IV SOSY
PREFILLED_SYRINGE | INTRAVENOUS | Status: DC | PRN
  Administered 2018-05-09 (×2): 5 mg via INTRAVENOUS

## 2018-05-09 MED ORDER — MIDAZOLAM HCL (PF) 10 MG/2ML IJ SOLN
INTRAMUSCULAR | Status: AC
  Filled 2018-05-09: qty 2

## 2018-05-09 MED ORDER — SODIUM CHLORIDE 0.9 % IV SOLN
250.0000 mL | INTRAVENOUS | Status: DC
  Administered 2018-05-13: 250 mL via INTRAVENOUS

## 2018-05-09 MED ORDER — SODIUM CHLORIDE 0.9 % IV SOLN
1.5000 g | Freq: Two times a day (BID) | INTRAVENOUS | Status: AC
  Administered 2018-05-09 – 2018-05-11 (×4): 1.5 g via INTRAVENOUS
  Filled 2018-05-09 (×4): qty 1.5

## 2018-05-09 MED ORDER — HEPARIN SODIUM (PORCINE) 1000 UNIT/ML IJ SOLN
INTRAMUSCULAR | Status: DC | PRN
  Administered 2018-05-09: 30000 [IU] via INTRAVENOUS

## 2018-05-09 MED ORDER — ACETAMINOPHEN 500 MG PO TABS
1000.0000 mg | ORAL_TABLET | Freq: Four times a day (QID) | ORAL | Status: AC
  Administered 2018-05-10 – 2018-05-14 (×16): 1000 mg via ORAL
  Filled 2018-05-09 (×17): qty 2

## 2018-05-09 MED ORDER — LIDOCAINE 2% (20 MG/ML) 5 ML SYRINGE
INTRAMUSCULAR | Status: DC | PRN
  Administered 2018-05-09: 100 mg via INTRAVENOUS

## 2018-05-09 MED ORDER — FENTANYL CITRATE (PF) 250 MCG/5ML IJ SOLN
INTRAMUSCULAR | Status: AC
  Filled 2018-05-09: qty 15

## 2018-05-09 MED ORDER — GLYCOPYRROLATE PF 0.2 MG/ML IJ SOSY
PREFILLED_SYRINGE | INTRAMUSCULAR | Status: DC | PRN
  Administered 2018-05-09 (×2): .2 mg via INTRAVENOUS

## 2018-05-09 MED ORDER — MIDAZOLAM HCL 5 MG/5ML IJ SOLN
INTRAMUSCULAR | Status: DC | PRN
  Administered 2018-05-09: 1 mg via INTRAVENOUS
  Administered 2018-05-09: 2 mg via INTRAVENOUS
  Administered 2018-05-09: 1 mg via INTRAVENOUS
  Administered 2018-05-09 (×2): 2 mg via INTRAVENOUS

## 2018-05-09 MED ORDER — SODIUM CHLORIDE 0.9% FLUSH
3.0000 mL | Freq: Two times a day (BID) | INTRAVENOUS | Status: DC
  Administered 2018-05-10 – 2018-05-11 (×3): 3 mL via INTRAVENOUS

## 2018-05-09 MED ORDER — ASPIRIN EC 325 MG PO TBEC
325.0000 mg | DELAYED_RELEASE_TABLET | Freq: Every day | ORAL | Status: DC
  Administered 2018-05-10 – 2018-05-11 (×2): 325 mg via ORAL
  Filled 2018-05-09 (×3): qty 1

## 2018-05-09 MED ORDER — PANTOPRAZOLE SODIUM 40 MG PO TBEC
40.0000 mg | DELAYED_RELEASE_TABLET | Freq: Every day | ORAL | Status: DC
  Administered 2018-05-11 – 2018-05-20 (×10): 40 mg via ORAL
  Filled 2018-05-09 (×10): qty 1

## 2018-05-09 MED ORDER — PROTAMINE SULFATE 10 MG/ML IV SOLN
INTRAVENOUS | Status: DC | PRN
  Administered 2018-05-09: 300 mg via INTRAVENOUS

## 2018-05-09 MED ORDER — LACTATED RINGERS IV SOLN
INTRAVENOUS | Status: DC | PRN
  Administered 2018-05-09: 07:00:00 via INTRAVENOUS

## 2018-05-09 MED ORDER — ROCURONIUM BROMIDE 10 MG/ML (PF) SYRINGE
PREFILLED_SYRINGE | INTRAVENOUS | Status: DC | PRN
  Administered 2018-05-09: 30 mg via INTRAVENOUS
  Administered 2018-05-09 (×2): 20 mg via INTRAVENOUS
  Administered 2018-05-09: 50 mg via INTRAVENOUS
  Administered 2018-05-09: 80 mg via INTRAVENOUS

## 2018-05-09 MED ORDER — FENTANYL CITRATE (PF) 250 MCG/5ML IJ SOLN
INTRAMUSCULAR | Status: AC
  Filled 2018-05-09: qty 10

## 2018-05-09 MED ORDER — SODIUM CHLORIDE 0.9 % IV SOLN
INTRAVENOUS | Status: DC | PRN
  Administered 2018-05-09: 13:00:00 via INTRAVENOUS

## 2018-05-09 MED ORDER — DOCUSATE SODIUM 100 MG PO CAPS
200.0000 mg | ORAL_CAPSULE | Freq: Every day | ORAL | Status: DC
  Administered 2018-05-10 – 2018-05-14 (×4): 200 mg via ORAL
  Filled 2018-05-09 (×5): qty 2

## 2018-05-09 MED ORDER — EPHEDRINE 5 MG/ML INJ
INTRAVENOUS | Status: AC
  Filled 2018-05-09: qty 10

## 2018-05-09 MED ORDER — MIDAZOLAM HCL 2 MG/2ML IJ SOLN
2.0000 mg | INTRAMUSCULAR | Status: DC | PRN
  Filled 2018-05-09: qty 2

## 2018-05-09 MED ORDER — LACTATED RINGERS IV SOLN: 500.0000 mL | Freq: Once | INTRAVENOUS | Status: DC | PRN

## 2018-05-09 MED ORDER — PROTAMINE SULFATE 10 MG/ML IV SOLN
INTRAVENOUS | Status: AC
  Filled 2018-05-09: qty 25

## 2018-05-09 MED ORDER — PROPOFOL 10 MG/ML IV BOLUS
INTRAVENOUS | Status: AC
  Filled 2018-05-09: qty 20

## 2018-05-09 MED ORDER — NITROGLYCERIN IN D5W 200-5 MCG/ML-% IV SOLN: 0.0000 ug/min | INTRAVENOUS | Status: DC

## 2018-05-09 MED ORDER — DOPAMINE-DEXTROSE 3.2-5 MG/ML-% IV SOLN
0.0000 ug/kg/min | INTRAVENOUS | Status: DC
  Filled 2018-05-09: qty 250

## 2018-05-09 MED ORDER — DEXMEDETOMIDINE HCL IN NACL 200 MCG/50ML IV SOLN
0.0000 ug/kg/h | INTRAVENOUS | Status: DC
  Administered 2018-05-09: 0.7 ug/kg/h via INTRAVENOUS
  Filled 2018-05-09: qty 50

## 2018-05-09 MED ORDER — FENTANYL CITRATE (PF) 250 MCG/5ML IJ SOLN
INTRAMUSCULAR | Status: DC | PRN
  Administered 2018-05-09: 100 ug via INTRAVENOUS
  Administered 2018-05-09: 50 ug via INTRAVENOUS
  Administered 2018-05-09 (×2): 150 ug via INTRAVENOUS
  Administered 2018-05-09 (×2): 100 ug via INTRAVENOUS
  Administered 2018-05-09: 200 ug via INTRAVENOUS
  Administered 2018-05-09: 50 ug via INTRAVENOUS
  Administered 2018-05-09: 150 ug via INTRAVENOUS
  Administered 2018-05-09: 200 ug via INTRAVENOUS

## 2018-05-09 MED ORDER — PROTAMINE SULFATE 10 MG/ML IV SOLN
INTRAVENOUS | Status: AC
  Filled 2018-05-09: qty 5

## 2018-05-09 MED ORDER — LIDOCAINE 2% (20 MG/ML) 5 ML SYRINGE
INTRAMUSCULAR | Status: AC
  Filled 2018-05-09: qty 5

## 2018-05-09 MED ORDER — ASPIRIN 81 MG PO CHEW: 324.0000 mg | CHEWABLE_TABLET | Freq: Every day | ORAL | Status: DC

## 2018-05-09 MED ORDER — PHENYLEPHRINE HCL-NACL 20-0.9 MG/250ML-% IV SOLN
0.0000 ug/min | INTRAVENOUS | Status: DC
  Administered 2018-05-09: 50 ug/min via INTRAVENOUS
  Administered 2018-05-10: 15 ug/min via INTRAVENOUS
  Filled 2018-05-09: qty 250

## 2018-05-09 MED ORDER — SODIUM CHLORIDE 0.9 % IV SOLN
INTRAVENOUS | Status: DC
  Administered 2018-05-09: 20:00:00 via INTRAVENOUS

## 2018-05-09 MED ORDER — ACETAMINOPHEN 160 MG/5ML PO SOLN: 650.0000 mg | Freq: Once | ORAL | Status: AC

## 2018-05-09 MED ORDER — SODIUM CHLORIDE 0.9% IV SOLUTION
Freq: Once | INTRAVENOUS | Status: AC
  Administered 2018-05-09: 16:00:00 via INTRAVENOUS

## 2018-05-09 MED ORDER — INSULIN REGULAR BOLUS VIA INFUSION
0.0000 [IU] | Freq: Three times a day (TID) | INTRAVENOUS | Status: DC
  Filled 2018-05-09: qty 10

## 2018-05-09 MED ORDER — ACETAMINOPHEN 650 MG RE SUPP
650.0000 mg | Freq: Once | RECTAL | Status: AC
  Administered 2018-05-09: 650 mg via RECTAL

## 2018-05-09 MED ORDER — METOPROLOL TARTRATE 5 MG/5ML IV SOLN: 2.5000 mg | INTRAVENOUS | Status: DC | PRN

## 2018-05-09 MED ORDER — ONDANSETRON HCL 4 MG/2ML IJ SOLN
4.0000 mg | Freq: Four times a day (QID) | INTRAMUSCULAR | Status: DC | PRN
  Administered 2018-05-11: 4 mg via INTRAVENOUS
  Filled 2018-05-09: qty 2

## 2018-05-09 MED ORDER — BISACODYL 5 MG PO TBEC
10.0000 mg | DELAYED_RELEASE_TABLET | Freq: Every day | ORAL | Status: DC
  Administered 2018-05-10 – 2018-05-14 (×4): 10 mg via ORAL
  Filled 2018-05-09 (×6): qty 2

## 2018-05-09 MED ORDER — GLYCOPYRROLATE 0.2 MG/ML IJ SOLN: INTRAMUSCULAR | Status: DC | PRN

## 2018-05-09 MED ORDER — SODIUM CHLORIDE 0.9 % IV SOLN
INTRAVENOUS | Status: DC
  Administered 2018-05-09: 15:00:00 via INTRAVENOUS

## 2018-05-09 MED ORDER — ALBUMIN HUMAN 5 % IV SOLN
250.0000 mL | INTRAVENOUS | Status: DC | PRN
  Administered 2018-05-09: 12.5 g via INTRAVENOUS

## 2018-05-09 MED ORDER — SODIUM CHLORIDE (PF) 0.9 % IJ SOLN
INTRAMUSCULAR | Status: AC
  Filled 2018-05-09: qty 10

## 2018-05-09 MED ORDER — OXYCODONE HCL 5 MG PO TABS: 5.0000 mg | ORAL_TABLET | ORAL | Status: DC | PRN

## 2018-05-09 SURGICAL SUPPLY — 135 items
ADAPTER CARDIO PERF ANTE/RETRO (ADAPTER) ×4 IMPLANT
ADH SKN CLS APL DERMABOND .7 (GAUZE/BANDAGES/DRESSINGS) ×3
ADPR PRFSN 84XANTGRD RTRGD (ADAPTER) ×3
ARTICLIP LAA PROCLIP II 45 (Clip) ×4 IMPLANT
ATTRACTOMAT 16X20 MAGNETIC DRP (DRAPES) ×2 IMPLANT
BAG DECANTER FOR FLEXI CONT (MISCELLANEOUS) ×8 IMPLANT
BLADE STERNUM SYSTEM 6 (BLADE) ×2 IMPLANT
BLADE SURG 11 STRL SS (BLADE) ×4 IMPLANT
CANISTER SUCT 3000ML PPV (MISCELLANEOUS) ×8 IMPLANT
CANNULA AORTIC ROOT 9FR (CANNULA) ×2 IMPLANT
CANNULA EZ GLIDE AORTIC 21FR (CANNULA) ×2 IMPLANT
CANNULA FEM VENOUS REMOTE 22FR (CANNULA) ×2 IMPLANT
CANNULA FEMORAL ART 14 SM (MISCELLANEOUS) ×4 IMPLANT
CANNULA GUNDRY RCSP 15FR (MISCELLANEOUS) ×4 IMPLANT
CANNULA OPTISITE PERFUSION 16F (CANNULA) IMPLANT
CANNULA OPTISITE PERFUSION 18F (CANNULA) ×2 IMPLANT
CANNULA SUMP PERICARDIAL (CANNULA) ×8 IMPLANT
CARDIOBLATE CARDIAC ABLATION (MISCELLANEOUS)
CATH KIT ON Q 5IN SLV (PAIN MANAGEMENT) IMPLANT
CATH ROBINSON RED A/P 18FR (CATHETERS) ×2 IMPLANT
CELLS DAT CNTRL 66122 CELL SVR (MISCELLANEOUS) ×3 IMPLANT
CLAMP OLL ABLATION (MISCELLANEOUS) ×2 IMPLANT
CONN ST 1/4X3/8  BEN (MISCELLANEOUS) ×5
CONN ST 1/4X3/8 BEN (MISCELLANEOUS) ×9 IMPLANT
CONNECTOR 1/2X3/8X1/2 3 WAY (MISCELLANEOUS) ×1
CONNECTOR 1/2X3/8X1/2 3WAY (MISCELLANEOUS) ×3 IMPLANT
CONT SPEC 4OZ CLIKSEAL STRL BL (MISCELLANEOUS) ×4 IMPLANT
COVER BACK TABLE 24X17X13 BIG (DRAPES) ×4 IMPLANT
COVER PROBE W GEL 5X96 (DRAPES) ×2 IMPLANT
COVER WAND RF STERILE (DRAPES) ×4 IMPLANT
CRADLE DONUT ADULT HEAD (MISCELLANEOUS) ×4 IMPLANT
DERMABOND ADVANCED (GAUZE/BANDAGES/DRESSINGS) ×1
DERMABOND ADVANCED .7 DNX12 (GAUZE/BANDAGES/DRESSINGS) ×5 IMPLANT
DEVICE ATRICLIP LAA PRCLPII 45 (Clip) ×1 IMPLANT
DEVICE CARDIOBLATE CARDIAC ABL (MISCELLANEOUS) IMPLANT
DEVICE PMI PUNCTURE CLOSURE (MISCELLANEOUS) ×4 IMPLANT
DEVICE SUT CK QUICK LOAD MINI (Prosthesis & Implant Heart) ×8 IMPLANT
DEVICE TROCAR PUNCTURE CLOSURE (ENDOMECHANICALS) ×4 IMPLANT
DRAIN CHANNEL 28F RND 3/8 FF (WOUND CARE) ×8 IMPLANT
DRAPE BILATERAL SPLIT (DRAPES) ×4 IMPLANT
DRAPE C-ARM 42X72 X-RAY (DRAPES) ×4 IMPLANT
DRAPE CV SPLIT W-CLR ANES SCRN (DRAPES) ×4 IMPLANT
DRAPE INCISE IOBAN 66X45 STRL (DRAPES) ×12 IMPLANT
DRAPE SLUSH/WARMER DISC (DRAPES) ×4 IMPLANT
DRSG AQUACEL AG ADV 3.5X14 (GAUZE/BANDAGES/DRESSINGS) ×2 IMPLANT
DRSG COVADERM 4X8 (GAUZE/BANDAGES/DRESSINGS) ×4 IMPLANT
ELECT BLADE 6.5 EXT (BLADE) ×4 IMPLANT
ELECT REM PT RETURN 9FT ADLT (ELECTROSURGICAL) ×8
ELECTRODE REM PT RTRN 9FT ADLT (ELECTROSURGICAL) ×6 IMPLANT
FELT TEFLON 1X6 (MISCELLANEOUS) ×8 IMPLANT
FEMORAL VENOUS CANN RAP (CANNULA) IMPLANT
GAUZE SPONGE 4X4 12PLY STRL (GAUZE/BANDAGES/DRESSINGS) ×4 IMPLANT
GAUZE SPONGE 4X4 12PLY STRL LF (GAUZE/BANDAGES/DRESSINGS) ×4 IMPLANT
GLOVE BIO SURGEON STRL SZ 6.5 (GLOVE) ×2 IMPLANT
GLOVE BIO SURGEON STRL SZ7.5 (GLOVE) ×2 IMPLANT
GLOVE ORTHO TXT STRL SZ7.5 (GLOVE) ×12 IMPLANT
GOWN STRL REUS W/ TWL LRG LVL3 (GOWN DISPOSABLE) ×12 IMPLANT
GOWN STRL REUS W/TWL LRG LVL3 (GOWN DISPOSABLE) ×16
HEMOSTAT POWDER SURGIFOAM 1G (HEMOSTASIS) ×6 IMPLANT
INSERT FOGARTY XLG (MISCELLANEOUS) ×2 IMPLANT
IV NS 1000ML (IV SOLUTION) ×4
IV NS 1000ML BAXH (IV SOLUTION) ×1 IMPLANT
IV NS IRRIG 3000ML ARTHROMATIC (IV SOLUTION) ×2 IMPLANT
KIT BASIN OR (CUSTOM PROCEDURE TRAY) ×4 IMPLANT
KIT DILATOR VASC 18G NDL (KITS) ×4 IMPLANT
KIT DRAINAGE VACCUM ASSIST (KITS) ×2 IMPLANT
KIT SUCTION CATH 14FR (SUCTIONS) ×12 IMPLANT
KIT SUT CK MINI COMBO 4X17 (Prosthesis & Implant Heart) ×2 IMPLANT
KIT TURNOVER KIT B (KITS) ×4 IMPLANT
LEAD PACING MYOCARDI (MISCELLANEOUS) ×4 IMPLANT
LINE VENT (MISCELLANEOUS) ×2 IMPLANT
LOOP VESSEL SUPERMAXI WHITE (MISCELLANEOUS) ×2 IMPLANT
NDL AORTIC ROOT 14G 7F (CATHETERS) ×2 IMPLANT
NEEDLE AORTIC ROOT 14G 7F (CATHETERS) ×4 IMPLANT
NS IRRIG 1000ML POUR BTL (IV SOLUTION) ×20 IMPLANT
PACK OPEN HEART (CUSTOM PROCEDURE TRAY) ×4 IMPLANT
PAD ARMBOARD 7.5X6 YLW CONV (MISCELLANEOUS) ×8 IMPLANT
PAD ELECT DEFIB RADIOL ZOLL (MISCELLANEOUS) ×4 IMPLANT
PROBE CRYO2-ABLATION MALLABLE (MISCELLANEOUS) ×2 IMPLANT
RETRACTOR WND ALEXIS 18 MED (MISCELLANEOUS) ×2 IMPLANT
RING MITRAL MEMO 4D 26 (Prosthesis & Implant Heart) ×2 IMPLANT
RING TRICUSPID T28 (Prosthesis & Implant Heart) ×2 IMPLANT
RTRCTR WOUND ALEXIS 18CM MED (MISCELLANEOUS) ×4
SET CANNULATION TOURNIQUET (MISCELLANEOUS) ×4 IMPLANT
SET CARDIOPLEGIA MPS 5001102 (MISCELLANEOUS) ×2 IMPLANT
SET IRRIG TUBING LAPAROSCOPIC (IRRIGATION / IRRIGATOR) ×4 IMPLANT
SOLUTION ANTI FOG 6CC (MISCELLANEOUS) ×4 IMPLANT
SPONGE LAP 4X18 RFD (DISPOSABLE) ×2 IMPLANT
SUT BONE WAX W31G (SUTURE) ×4 IMPLANT
SUT E-PACK MINIMALLY INVASIVE (SUTURE) ×4 IMPLANT
SUT ETHIBOND (SUTURE) ×6 IMPLANT
SUT ETHIBOND 2 0 SH (SUTURE) ×12 IMPLANT
SUT ETHIBOND 2 0 SH 36X2 (SUTURE) ×2 IMPLANT
SUT ETHIBOND 2 0 V4 (SUTURE) IMPLANT
SUT ETHIBOND 2 0V4 GREEN (SUTURE) IMPLANT
SUT ETHIBOND 2-0 RB-1 WHT (SUTURE) ×6 IMPLANT
SUT ETHIBOND 4 0 TF (SUTURE) IMPLANT
SUT ETHIBOND 5 0 C 1 30 (SUTURE) IMPLANT
SUT ETHIBOND NAB MH 2-0 36IN (SUTURE) IMPLANT
SUT ETHIBOND X763 2 0 SH 1 (SUTURE) ×8 IMPLANT
SUT GORETEX 6.0 TH-9 30 IN (SUTURE) IMPLANT
SUT GORETEX CV 4 TH 22 36 (SUTURE) ×4 IMPLANT
SUT GORETEX CV-5THC-13 36IN (SUTURE) IMPLANT
SUT GORETEX CV4 TH-18 (SUTURE) ×8 IMPLANT
SUT GORETEX TH-18 36 INCH (SUTURE) IMPLANT
SUT MNCRL AB 3-0 PS2 18 (SUTURE) ×2 IMPLANT
SUT PDS AB 1 CTX 36 (SUTURE) ×4 IMPLANT
SUT PROLENE 3 0 SH1 36 (SUTURE) ×18 IMPLANT
SUT PROLENE 4 0 RB 1 (SUTURE) ×12
SUT PROLENE 4 0 SH DA (SUTURE) ×2 IMPLANT
SUT PROLENE 4-0 RB1 .5 CRCL 36 (SUTURE) ×3 IMPLANT
SUT SILK  1 MH (SUTURE) ×1
SUT SILK 1 MH (SUTURE) ×1 IMPLANT
SUT SILK 2 0 SH CR/8 (SUTURE) ×2 IMPLANT
SUT SILK 3 0 SH CR/8 (SUTURE) IMPLANT
SUT STEEL STERNAL CCS#1 18IN (SUTURE) ×2 IMPLANT
SUT STEEL SZ 6 DBL 3X14 BALL (SUTURE) ×4 IMPLANT
SUT VIC AB 2-0 CTX 36 (SUTURE) IMPLANT
SUT VIC AB 3-0 SH 8-18 (SUTURE) IMPLANT
SUT VICRYL 2 TP 1 (SUTURE) IMPLANT
SYR 10ML LL (SYRINGE) ×4 IMPLANT
SYSTEM SAHARA CHEST DRAIN ATS (WOUND CARE) ×8 IMPLANT
TAPE CLOTH SURG 4X10 WHT LF (GAUZE/BANDAGES/DRESSINGS) ×2 IMPLANT
TAPE PAPER 2X10 WHT MICROPORE (GAUZE/BANDAGES/DRESSINGS) ×2 IMPLANT
TOWEL GREEN STERILE (TOWEL DISPOSABLE) ×4 IMPLANT
TOWEL GREEN STERILE FF (TOWEL DISPOSABLE) ×4 IMPLANT
TRAY FOLEY SLVR 16FR TEMP STAT (SET/KITS/TRAYS/PACK) ×4 IMPLANT
TROCAR XCEL BLADELESS 5X75MML (TROCAR) ×4 IMPLANT
TROCAR XCEL NON-BLD 11X100MML (ENDOMECHANICALS) ×8 IMPLANT
TUBE SUCT INTRACARD DLP 20F (MISCELLANEOUS) ×4 IMPLANT
TUNNELER SHEATH ON-Q 11GX8 DSP (PAIN MANAGEMENT) IMPLANT
UNDERPAD 30X30 (UNDERPADS AND DIAPERS) ×4 IMPLANT
WATER STERILE IRR 1000ML POUR (IV SOLUTION) ×8 IMPLANT
WIRE .035 3MM-J 145CM (WIRE) ×4 IMPLANT
YANKAUER SUCT BULB TIP NO VENT (SUCTIONS) ×2 IMPLANT

## 2018-05-09 NOTE — Anesthesia Procedure Notes (Signed)
Arterial Line Insertion Start/End12/04/2018 6:40 AM Performed by: Glynda Jaeger, CRNA, CRNA  Preanesthetic checklist: patient identified, IV checked, risks and benefits discussed, surgical consent and monitors and equipment checked Lidocaine 1% used for infiltration Left, radial was placed Catheter size: 20 G Hand hygiene performed  and maximum sterile barriers used  Allen's test indicative of satisfactory collateral circulation Attempts: 2 (attempt x1 by K. White Immunologist, attempt x2 by A. Holtzman, CRNA) Procedure performed without using ultrasound guided technique. Following insertion, dressing applied and Biopatch. Post procedure assessment: normal  Patient tolerated the procedure well with no immediate complications.

## 2018-05-09 NOTE — Progress Notes (Signed)
Subjective:  Appreciate Dr. Ricard Dillon help. Patient intubated being weaned off from the vent tolerated mitral and tricuspid valve repair with annuloplasty ring and maze procedure and clipping of left atrial appendage. Chest tube drainage improved after receiving FFP platelets and packed RBCs. Remains hemodynamically stable.  Objective:  Vital Signs in the last 24 hours: Temp:  [95 F (35 C)-97.6 F (36.4 C)] 95.7 F (35.4 C) (12/12 1730) Pulse Rate:  [60-80] 80 (12/12 1730) Resp:  [0-76] 76 (12/12 1730) BP: (95-139)/(51-81) 103/51 (12/12 1730) SpO2:  [95 %-98 %] 98 % (12/12 1730) Arterial Line BP: (113-143)/(53-80) 132/80 (12/12 1730) FiO2 (%):  [50 %] 50 % (12/12 1600) Weight:  [88.1 kg] 88.1 kg (12/12 0508)  Intake/Output from previous day: 12/11 0701 - 12/12 0700 In: 764 [P.O.:684; I.V.:80] Out: -  Intake/Output from this shift: Total I/O In: 5238.3 [I.V.:3470.4; Blood:1258; IV Piggyback:509.9] Out: 1615 [Urine:755; Blood:650; Chest Tube:210]  Physical Exam: Lungs: clear to auscultation anteriorly Heart: regular rate and rhythm and S1, S2 normal Abdomen: soft distended Extremities: extremities normal, atraumatic, no cyanosis or edema  Lab Results: Recent Labs    05/09/18 0336  05/09/18 1144  05/09/18 1426 05/09/18 1429  WBC 5.4  --   --   --  11.9*  --   HGB 8.7*   < > 6.4*   < > 7.2* 7.5*  PLT 138*  --  102*  --  96*  --    < > = values in this interval not displayed.   Recent Labs    05/08/18 0439 05/09/18 0336  05/09/18 1208 05/09/18 1247 05/09/18 1429  NA 139 138   < > 140 140 141  K 4.3 4.4   < > 5.0 4.4 4.3  CL 112* 112*   < > 110 110  --   CO2 19* 18*  --   --   --   --   GLUCOSE 94 95   < > 142* 148* 119*  BUN 20 19   < > 18 18  --   CREATININE 1.47* 1.43*   < > 1.10* 1.10*  --    < > = values in this interval not displayed.   No results for input(s): TROPONINI in the last 72 hours.  Invalid input(s): CK, MB Hepatic Function Panel Recent Labs   05/07/18 0355  PROT 6.0*  ALBUMIN 2.9*  AST 36  ALT 37  ALKPHOS 70  BILITOT 0.3   No results for input(s): CHOL in the last 72 hours. No results for input(s): PROTIME in the last 72 hours.  Imaging: Imaging results have been reviewed and Dg Chest 2 View  Result Date: 05/08/2018 CLINICAL DATA:  Chronic shortness of breath. EXAM: CHEST - 2 VIEW COMPARISON:  CT chest dated May 06, 2018. Chest x-ray dated May 03, 2018. FINDINGS: Stable cardiomegaly. Mildly increased interstitial markings. Mild bibasilar atelectasis. Small amount of fluid in the right minor fissure. No focal consolidation, pleural effusion, or pneumothorax. No acute osseous abnormality. IMPRESSION: Stable cardiomegaly and mild interstitial edema. Electronically Signed   By: Titus Dubin M.D.   On: 05/08/2018 17:09   Dg Chest Port 1 View  Result Date: 05/09/2018 CLINICAL DATA:  Followup mitral valve replacement. EXAM: PORTABLE CHEST 1 VIEW COMPARISON:  05/08/2018 FINDINGS: Interval median sternotomy and mitral valve replacement. Endotracheal tube tip 3 cm above the carina. Nasogastric tube tip probably within a small hiatal hernia. Left internal jugular Swan-Ganz catheter tip in the right main pulmonary artery. Bilateral chest tubes  in place. No pneumothorax. Mild edema pattern. Mild basilar atelectasis. IMPRESSION: Status post mitral valve replacement. Lines and tubes well positioned. No pneumothorax. Mild edema pattern. Mild basilar atelectasis. Electronically Signed   By: Nelson Chimes M.D.   On: 05/09/2018 14:35    Cardiac Studies:  Assessment/Plan:  Severe symptomatic mitral regurgitation/tricuspid regurgitation status post MVR/TVR and annuloplasty/Maze procedure and left atrial appendage clipping Pulmonary hypertension. 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 Hypertension. Diabetes mellitus. Hyperlipidemia. Osteoarthritis Plan Continue present management  as per CVTS  LOS: 6 days    [name] 05/09/2018, 5:34 PM

## 2018-05-09 NOTE — Progress Notes (Signed)
Pt now following commands on 0.1 mcg Precedex. When awakened, pt begins bucking vent with RR to mid 30's. Precedex will remain on d/t increased anxiety. With verbal coaching and assurance, pt returns to normal RR of 20.   Unable to lift head off bed. Will continue to monitor for readiness to wean.

## 2018-05-09 NOTE — Progress Notes (Signed)
      [address]       [place] [address]             [phone]     CARDIOTHORACIC SURGERY PROGRESS NOTE  Subjective: Diane [name] has been scheduled for Procedure(s): MINIMALLY INVASIVE MITRAL VALVE REPAIR OR REPLACEMENT (MVR) (Right) MINIMALLY INVASIVE MAZE PROCEDURE (N/A) TRANSESOPHAGEAL ECHOCARDIOGRAM (TEE) (N/A) today.   Objective: Vital signs in last 24 hours: Temp:  [97.3 F (36.3 C)-98.4 F (36.9 C)] 97.6 F (36.4 C) (12/12 0508) Pulse Rate:  [60-70] 70 (12/12 0508) Cardiac Rhythm: Atrial fibrillation (12/11 1950) Resp:  [18] 18 (12/11 1635) BP: (113-127)/(51-81) 119/81 (12/12 0508) SpO2:  [95 %] 95 % (12/11 1635) Weight:  [88.1 kg] 88.1 kg (12/12 0508)  Physical Exam: Unchanged from previously   Intake/Output from previous day: 12/11 0701 - 12/12 0700 In: 764 [P.O.:684; I.V.:80] Out: -  Intake/Output this shift: Total I/O In: 320 [P.O.:240; I.V.:80] Out: -   Lab Results: Recent Labs    05/07/18 0355 05/09/18 0336  WBC 5.6 5.4  HGB 9.0* 8.7*  HCT 28.5* 28.4*  PLT 152 138*   BMET:  Recent Labs    05/08/18 0439 05/09/18 0336  NA 139 138  K 4.3 4.4  CL 112* 112*  CO2 19* 18*  GLUCOSE 94 95  BUN 20 19  CREATININE 1.47* 1.43*  CALCIUM 8.5* 8.4*    CBG (last 3)  Recent Labs    05/08/18 1057 05/08/18 1632 05/08/18 2148  GLUCAP 90 149* 96   PT/INR:  No results for input(s): LABPROT, INR in the last 72 hours.  Assessment/Plan:   The various methods of treatment have been discussed with the patient. After consideration of the risks, benefits and treatment options the patient has consented to the planned procedure.   The patient has been seen and labs reviewed. There are no changes in the patient's condition to prevent proceeding with the planned procedure today.   Rexene Alberts, MD 05/09/2018 6:42 AM

## 2018-05-09 NOTE — Anesthesia Procedure Notes (Signed)
Central Venous Catheter Insertion Performed by: Audry Pili, MD, anesthesiologist Start/End12/04/2018 7:03 AM, 05/09/2018 7:08 AM Patient location: Pre-op. Preanesthetic checklist: patient identified, IV checked, risks and benefits discussed, surgical consent, monitors and equipment checked, pre-op evaluation, timeout performed and anesthesia consent Position: Trendelenburg Hand hygiene performed  and maximum sterile barriers used  Total catheter length 10. PA cath was placed.Swan type:thermodilution PA Cath depth:58 Procedure performed without using ultrasound guided technique. Attempts: 1 Patient tolerated the procedure well with no immediate complications.

## 2018-05-09 NOTE — Anesthesia Preprocedure Evaluation (Addendum)
Anesthesia Evaluation  Patient identified by MRN, date of birth, ID band Patient awake    Reviewed: Allergy & Precautions, NPO status , Patient's Chart, lab work & pertinent test results  Airway Mallampati: III  TM Distance: >3 FB Neck ROM: Full    Dental  (+) Dental Advisory Given, Teeth Intact   Pulmonary former smoker,    breath sounds clear to auscultation + decreased breath sounds      Cardiovascular hypertension, + CAD and +CHF  + dysrhythmias + Valvular Problems/Murmurs MR  Rhythm:Regular Rate:Bradycardia     Neuro/Psych  Headaches, PSYCHIATRIC DISORDERS Anxiety Depression  Neuromuscular disease    GI/Hepatic   Endo/Other  diabetes  Renal/GU Renal InsufficiencyRenal disease     Musculoskeletal  (+) Arthritis , Fibromyalgia -  Abdominal   Peds  Hematology  (+) anemia ,   Anesthesia Other Findings Cath:  Prox LAD lesion is 30% stenosed.  Prox RCA lesion is 40% stenosed.  Mid RCA lesion is 15% stenosed.  Ost 1st Diag to 1st Diag lesion is 40% stenosed.  Hemodynamic findings consistent with severe pulmonary hypertension and mitral valve regurgitation.  Tte:  - Left ventricle: Systolic function was normal. The estimated   ejection fraction was in the range of 55% to 60%. Wall motion was   normal; there were no regional wall motion abnormalities. - Aortic valve: Moderate focal calcification involving the right   coronary, left coronary, and noncoronary cusp. Cusp separation   was mildly reduced. Noncoronary cusp mobility was mildly   restricted. There was mild stenosis. There was mild   regurgitation. - Mitral valve: Mildly calcified annulus. Moderate diffuse   dysplasia of the posterior leaflet. Leaflet separation was mildly   reduced. Mild systolic bowing without prolapse, involving the   middle segment of the anterior leaflet. The findings are   consistent with mild stenosis. There was severe  regurgitation,   with multiple jets directed centrally, eccentrically, and toward   the free wall. - Left atrium: The atrium was severely dilated. No evidence of   thrombus in the atrial cavity or appendage. No evidence of   thrombus in the appendage. There was spontaneous echo contrast   ("smoke"). - Right atrium: The atrium was mildly dilated. No evidence of   thrombus in the atrial cavity or appendage. - Atrial septum: No defect or patent foramen ovale was identified. - Superior vena cava: The study excluded a thrombus. - Pericardium, extracardiac: A trivial to small pericardial   effusion was identified.   Reproductive/Obstetrics                           Anesthesia Physical Anesthesia Plan  ASA: IV  Anesthesia Plan: General   Post-op Pain Management:    Induction: Intravenous  PONV Risk Score and Plan: 3 and Treatment may vary due to age or medical condition  Airway Management Planned: Oral ETT  Additional Equipment: Arterial line, CVP, PA Cath, TEE and Ultrasound Guidance Line Placement  Intra-op Plan:   Post-operative Plan: Post-operative intubation/ventilation  Informed Consent: I have reviewed the patients History and Physical, chart, labs and discussed the procedure including the risks, benefits and alternatives for the proposed anesthesia with the patient or authorized representative who has indicated his/her understanding and acceptance.   Dental advisory given  Plan Discussed with: CRNA and Surgeon  Anesthesia Plan Comments:         Anesthesia Quick Evaluation

## 2018-05-09 NOTE — Progress Notes (Signed)
CT surgery p.m. Rounds  Patient recovering from mitral valve and tricuspid valve repairs Received  unit packed cells for hemoglobin 7.5 Hemodynamics have remained stable Chest tube output is improved Ventilator weaning later is planned

## 2018-05-09 NOTE — Progress Notes (Signed)
  Echocardiogram Echocardiogram Transesophageal has been performed.  Diane [name] 05/09/2018, 9:50 AM

## 2018-05-09 NOTE — Transfer of Care (Signed)
Immediate Anesthesia Transfer of Care Note  Patient: Diane [name]  Procedure(s) Performed: TRANSESOPHAGEAL ECHOCARDIOGRAM (TEE) (N/A ) CLIPPING OF ATRIAL APPENDAGE USING ATRICLIP PRO2 CLIP SIZE 45MM (N/A Chest) MAZE (N/A Chest) MITRAL VALVE REPAIR (MVR) USING MEMO 4D RING SIZE 26MM (N/A Chest) TRICUSPID VALVE REPAIR USING MC3 RING SIZE 28MM (N/A Chest)  Patient Location: ICU  Anesthesia Type:General  Level of Consciousness: Patient remains intubated per anesthesia plan  Airway & Oxygen Therapy: Patient remains intubated per anesthesia plan and Patient placed on Ventilator (see vital sign flow sheet for setting)  Post-op Assessment: Report given to RN and Post -op Vital signs reviewed and stable  Post vital signs: Reviewed and stable  Last Vitals:  Vitals Value Taken Time  BP 119/95 05/09/2018  2:23 PM  Temp 35.4 C 05/09/2018  2:26 PM  Pulse 80 05/09/2018  2:26 PM  Resp 14 05/09/2018  2:26 PM  SpO2 92 % 05/09/2018  2:26 PM  Vitals shown include unvalidated device data.  Last Pain:  Vitals:   05/09/18 0508  TempSrc: Oral  PainSc:       Patients Stated Pain Goal: 0 (62/03/55 [id number])  Complications: No apparent anesthesia complications

## 2018-05-09 NOTE — Anesthesia Procedure Notes (Signed)
Procedure Name: Intubation Date/Time: 05/09/2018 8:14 AM Performed by: Oleta Mouse, MD Pre-anesthesia Checklist: Patient identified, Emergency Drugs available, Suction available and Patient being monitored Patient Re-evaluated:Patient Re-evaluated prior to induction Oxygen Delivery Method: Circle System Utilized Preoxygenation: Pre-oxygenation with 100% oxygen Induction Type: IV induction Ventilation: Mask ventilation without difficulty Laryngoscope Size: Mac and 4 Grade View: Grade II Tube type: Oral Endobronchial tube: Left, Double lumen EBT, EBT position confirmed by fiberoptic bronchoscope and EBT position confirmed by auscultation and 37 Fr Number of attempts: 2 (DLx1 by CRNA, DLx2 by MDA) Airway Equipment and Method: Stylet Placement Confirmation: ETT inserted through vocal cords under direct vision,  positive ETCO2 and breath sounds checked- equal and bilateral Secured at: 30 cm Tube secured with: Tape Dental Injury: Teeth and Oropharynx as per pre-operative assessment

## 2018-05-09 NOTE — Anesthesia Procedure Notes (Addendum)
Central Venous Catheter Insertion Performed by: Audry Pili, MD, anesthesiologist Start/End12/04/2018 6:40 AM, 05/09/2018 7:08 AM Patient location: Pre-op. Preanesthetic checklist: patient identified, IV checked, risks and benefits discussed, surgical consent, monitors and equipment checked, pre-op evaluation, timeout performed and anesthesia consent Position: Trendelenburg Lidocaine 1% used for infiltration and patient sedated Hand hygiene performed , maximum sterile barriers used  and Seldinger technique used Catheter size: 8.5 Fr Central line was placed.MAC introducer Procedure performed using ultrasound guided technique. Ultrasound Notes:anatomy identified, needle tip was noted to be adjacent to the nerve/plexus identified, no ultrasound evidence of intravascular and/or intraneural injection and image(s) printed for medical record Attempts: 1 Following insertion, line sutured, dressing applied and Biopatch. Post procedure assessment: blood return through all ports, no air and free fluid flow  Patient tolerated the procedure well with no immediate complications.

## 2018-05-09 NOTE — Brief Op Note (Signed)
05/02/2018 - 05/09/2018  12:37 PM  PATIENT:  Diane [name]  78 y.o. female  PRE-OPERATIVE DIAGNOSIS:  MVR, AFIB  POST-OPERATIVE DIAGNOSIS:  MVR, AFIB  PROCEDURE:  Mitral valve repair with ring annuloplasty, Memo 4-D      Tricuspid Valve repair with MC3 ring annuloplasty                             Clip LAA  SURGEON:  Surgeon(s) and Role:    Rexene Alberts, MD - Primary  PHYSICIAN ASSISTANT: WAYNE GOLD PA-C  ANESTHESIA:   regional  EBL:  800   BLOOD ADMINISTERED:none  DRAINS: PLEURAL AND MEDIASTINAL CHEST TUBES   LOCAL MEDICATIONS USED:  NONE  SPECIMEN:  No Specimen  DISPOSITION OF SPECIMEN:  N/A  COUNTS:  YES  TOURNIQUET:  * No tourniquets in log *  DICTATION: .Dragon Dictation  PLAN OF CARE: Admit to inpatient   PATIENT DISPOSITION:  ICU - intubated and hemodynamically stable.   Delay start of Pharmacological VTE agent (>24hrs) due to surgical blood loss or risk of bleeding: yes  COMPLICATIONS: NO KNOWN

## 2018-05-09 NOTE — OR Nursing (Signed)
Gave 45 minute warning to 2H. Spoke with [place].

## 2018-05-09 NOTE — Op Note (Signed)
CARDIOTHORACIC SURGERY OPERATIVE NOTE  Date of Procedure:  05/09/2018  Preoperative Diagnosis:   Severe Mitral Regurgitation  Long-standing Persistent Atrial Fibrillation  Postoperative Diagnosis:   Severe Mitral Regurgitation  Severe Tricuspid Regurgitation  Long-standing Persistent Atrial Fibrillation   Procedure:   Mitral Valve Repair  Sorin Memo 4D ring annuloplasty (size 43mm, model #4DM-26, serial #[id number])   Tricuspid Valve Repair  Edwards mc3 ring annuloplasty (size 2mm, model #4900, serial #[id number])   Maze Procedure   Complete bilateral atrial lesion set using bipolar radiofrequency and cryothermy ablation  clipping of left atrial appendage (Atricure Pro245 left atrial clip size 62mm)    Surgeon: Valentina Gu. Roxy Manns, MD  Assistant: [name], PA-C  Anesthesia: Laurie Panda, MD  Operative Findings:  Dilated mitral annulus and non-ischemic cardiomyopathy   Type I mitral valve dysfunction with severe mitral regurgitation  Moderate global left ventricular systolic dysfunction  Moderate right ventricular systolic dysfunction  Dilate tricuspid annulus  Type I tricuspid valve dysfunction with severe tricuspid regurgitation  Severe pulmonary hypertension  Mild to moderate aortic insufficiency  No residual mitral regurgitation after successful valve repair  No residual tricuspid regurgitation after successful valve repair                  BRIEF CLINICAL NOTE AND INDICATIONS FOR SURGERY  Patient is a 77 year old African-American female with history of mitral regurgitation, persistent atrial fibrillation, hypertension, chronic diastolic congestive heart failure, type 2 diabetes mellitus, hyperlipidemia, iron deficient anemia, arthritis, and depression who has been referred for surgical consultation for management of severe mitral regurgitation.  Patient states that she has had an irregular heart rhythm for many years and more recently  she has known of the presence of a heart murmur.  She denies any known history of rheumatic fever in the distant past.  She has been followed by Dr. Terrence Dupont for several years.  She is chronically anticoagulated using Xarelto for atrial fibrillation.  She has a long history of symptoms of shortness of breath which has progressed dramatically over the past year.  She was hospitalized in January 2019 with an exacerbation of shortness of breath coughing and some chills.  Chest x-ray revealed pulmonary vascular congestion with bilateral pleural effusions.  She was treated with intravenous Lasix and antibiotics for possible pneumonia.  She improved rapidly.  Nuclear stress test was low risk for ischemia.  Echocardiogram performed at that time revealed normal left ventricular size and systolic function with ejection fraction estimated 55 to 60%.  There was grade 2 diastolic dysfunction.  There was mild aortic insufficiency and what was felt to be moderate mitral regurgitation.  There was mild left atrial enlargement.    Over the past year the patient states that she has developed continued progression of symptoms of exertional shortness of breath.  She has been seen by a pulmonologist on multiple occasions, and PFT's reveal mild to moderate COPD.  She now gets short of breath with low-level activity and occasionally at rest.  She cannot lay flat in bed and typically sleeps on 4 pillows.  She occasionally wakes up in the middle night gasping for breath and her breathing is relieved by sitting straight up.  She has some lower extremity edema, worse on the left side.  She has not had tachypalpitations or dizzy spells.  She denies any chest pain or chest tightness.    The patient underwent left and right heart catheterization earlier by Dr. Terrence Dupont.  She was found to have mild nonobstructive coronary artery  disease.  There was severe pulmonary hypertension with PA pressures measured 47/13 central venous pressure 17.   Pulmonary capillary wedge pressure was not reported.  Cardiothoracic surgical consultation requested.  Subsequent transthoracic and transesophageal echocardiograms confirmed the presence of severe mitral regurgitation.  The patient has been seen in consultation and counseled at length regarding the indications, risks and potential benefits of surgery.  All questions have been answered, and the patient provides full informed consent for the operation as described.     DETAILS OF THE OPERATIVE PROCEDURE  Preparation:  The patient is brought to the operating room on the above mentioned date and central monitoring was established by the anesthesia team including placement of Swan-Ganz catheter and radial arterial line.  The patient had severe pulmonary hypertension at baseline with PA pressures in the mid 80s.  The patient is placed in the supine position on the operating table.  Intravenous antibiotics are administered. General endotracheal anesthesia is induced uneventfully. A Foley catheter is placed.  Baseline transesophageal echocardiogram was performed.  Findings were notable for both severe mitral regurgitation and severe tricuspid regurgitation.  The left ventricle was dilated with end-diastolic dimension measured 6.5 cm.  There was severe dilatation of the mitral annulus.  Mitral valve leaflet mobility appeared essentially normal with type I mitral valve dysfunction causing a broad central jet of regurgitation.  There was flow reversal in the pulmonary veins.  The right ventricle was dilated.  There was moderate right ventricular systolic dysfunction.  There was severe tricuspid regurgitation with type I dysfunction.  The aortic valve was trileaflet.  There was mild to moderate central aortic insufficiency.  The patient's chest, abdomen, both groins, and both lower extremities are prepared and draped in a sterile manner. A time out procedure is performed.   Surgical Approach:  A small  incision was made in the patient's right groin and the anterior surface of the right common femoral artery and right common femoral veins are both exposed.  There are dense adhesions and hematoma from the patient's recent diagnostic cardiac catheterization.  The anterior surface of the femoral artery is notably diseased with atherosclerotic plaque.  Decision is made not to proceed with femoral artery cannulation due to the severity of plaque in the femoral artery.  A small pursestring suture is placed on the anterior surface of the right common femoral vein.  A median sternotomy incision was performed and the pericardium is opened. The ascending aorta is normal in appearance.    Extracorporeal Cardiopulmonary Bypass and Myocardial Protection:  The right common femoral vein is cannulated using the Seldinger technique and a guidewire advanced into the right atrium using TEE guidance.  The right internal jugular vein is cannulated with Seldinger technique and a flexible guidewire advanced into the right atrium.  The patient is heparinized systemically and the femoral vein cannulated using a 22 Fr long femoral venous cannula.  The right internal jugular vein is cannulated with a 14 [place] pediatric femoral venous cannula.  The ascending aorta is cannulated for cardiopulmonary bypass.  Adequate heparinization is verified.   A retrograde cardioplegia cannula is placed through the right atrium into the coronary sinus.   The entire pre-bypass portion of the operation was notable for stable hemodynamics.  Cardiopulmonary bypass was begun and the surface of the heart is inspected.  Umbilical tapes were placed around the superior vena cava and the inferior vena cava.   A cardioplegia cannula is placed in the ascending aorta.  A temperature probe was placed in  the interventricular septum.  The patient is cooled to 28C systemic temperature.  The aortic cross clamp is applied and cardioplegia is delivered initially in  an antegrade fashion through the aortic root using modified [place] cold blood cardioplegia ([facility] blood cardioplegia protocol).   The initial cardioplegic arrest is rapid with early diastolic arrest.  Myocardial protection was felt to be excellent.   Maze Procedure (left atrial lesion set):  The AtriCure Synergy bipolar radiofrequency ablation clamp is used for all radiofrequency ablation lesions for the maze procedure.  The Atricure CryoICE nitrous oxide cryothermy system is utilized for all cryothermy ablation lesions.   The heart is retracted towards the surgeon's side and the left sided pulmonary veins exposed.  An elliptical ablation lesion is created around the base of the left sided pulmonary veins.  A similar elliptical lesion was created around the base of the left atrial appendage.  The left atrial appendage was obliterated using an left atrial appendage clip (Atricure Pro245 left atrial clip size 21mm).  The heart was replaced into the pericardial sac.  An elliptical ablation lesion is created around the base of the right sided pulmonary veins.    A left atriotomy incision was performed through the interatrial groove and extended partially across the back wall of the left atrium after opening the oblique sinus inferiorly.  The floor of the left atrium and the mitral valve were exposed using a self-retaining retractor.  The mitral valve was inspected.  A bipolar ablation lesion was placed across the dome of the left atrium from the cephalad apex of the atriotomy incision to reach the cephalad apex of the elliptical lesion around the left sided pulmonary veins.  A similar bipolar lesion was placed across the back wall of the left atrium from the caudad apex of the atriotomy incision to reach the caudad apex of the elliptical lesion around the left sided pulmonary veins, thereby completing a box.  Finally another bipolar lesion was placed across the back wall of the left atrium from the  caudad apex of the atriotomy incision towards the posterior mitral valve annulus.  This lesion was completed along the endocardial surface onto the posterior mitral annulus with a 3 minute duration cryothermy lesion, followed by a second cryothermy lesion along the posterior epicardial surface of the left atrium across the coronary sinus with the probe extending up the epicardial surface of the lateral wall of the right atrium. This completes the entire left side lesion set of the Cox maze procedure.   Mitral Valve Repair:  The mitral valve was inspected and notable for essentially normal leaflet anatomy and normal anatomy of the subvalvular apparatus.  The mitral annulus was extremely dilated.  There was minimal fibrosis of the midportion of the anterior leaf of the mitral valve.  The posterior leaflet moves well.  Mitral ring annuloplasty was performed using interrupted 2-0 Ethibond horizontal mattress sutures placed circumferentially around the entire mitral annulus.  Mitral valve is sized to accept a 28 mm annuloplasty ring based upon the height, width, and total surface area of the anterior leaflet.  This is downsized to a 26 mm ring because of the underlying functional pathology related to functional mitral regurgitation.  A Sorin Memo 4D annuloplasty ring (size 26 mm, catalog # 4DM-26, serial [id number]) is secured in place uneventfully.  All ring sutures are secured using Cor knot clips.  After completion of ring annuloplasty valve is tested with saline.  There is a broad symmetrical line of coaptation between  the anterior and posterior leaflet with 4 mm of overlap circumferentially.  There is no residual leak.  The atriotomy was closed using a 2-layer closure of running 3-0 Prolene suture after placing a sump drain across the mitral valve to serve as a left ventricular vent.     Maze Procedure (right atrial lesion set):  The inferior vena cava cannula was pulled down until the tip was just  below the junction between the right atrium and the inferior vena cava. An oblique incision is made in the right atrium. Traction sutures are placed to facilitate exposure of the tricuspid valve. The tricuspid valve is inspected carefully. The tricuspid valve leaflets appear normal with normal mobility and no sign of any fibrosis or thickening.   The AtriCure Synergy bipolar radiofrequency ablation clamp is utilized to create a series of linear lesions in the right atrium, each with one limb of the clamp along the endocardial surface and the other along the epicardial surface. The first lesion is placed from the posterior apex of the atriotomy incision and along the lateral wall of the right atrium to reach the lateral aspect of the superior vena cava, connecting with the cryothermy lesion placed previously across the coronary sinus and up the epicardial surface of the lateral right atrium.  A second lesion is placed in the opposite direction from the posterior apex of the atriotomy incision along the lateral wall to reach the lateral aspect of the inferior vena cava. A third lesion is placed from the midportion of the atriotomy incision extending at a right angle to reach the tip of the right atrial appendage. A fourth lesion is placed from the anterior apex of the atriotomy incision in an anterior and inferior direction to reach the acute margin of the heart. Finally, the cryotherapy probe is utilized to complete the right atrial lesion set by placing the probe along the endocardial surface of the right atrium from the anterior apex of the atriotomy incision to reach the tricuspid annulus at the 2:00 position.   Tricuspid Valve Repair:  Tricuspid ring annuloplasty is performed using interrupted 2-0 Ethibond horizontal mattress sutures placed circumferentially around the tricuspid annulus with exception of the area immediately below the triangle of Koch.   After placement of all of the annuloplasty sutures  a single dose of warm retrograde "reanimation dose" blood cardioplegia was given and the aortic cross clamp removed after a total cross clamp duration of 101 minutes.  The tricuspid valve was sized to accept a 54mm annuloplasty ring based upon the overall surface area of the combined anterior and posterior leaflets. An [facility] 3 annuloplasty ring (size 55mm, model #4900, serial [id number]) is implanted uneventfully. After completion of the annuloplasty the valve was tested with saline and appears to be competent. The right atriotomy incision is closed using a 2 layer closure of running 4-0 Prolene suture.   Procedure Completion:  Epicardial pacing wires are fixed to the right ventricular outflow tract and to the right atrial appendage. The patient is rewarmed to 37C temperature. The aortic and left ventricular vents are removed.  The patient is weaned and disconnected from cardiopulmonary bypass.  The patient's rhythm at separation from bypass was AV paced.  The patient was weaned from cardioplegic bypass on milrinone 0.5 mcg/kg/min.  Low dose dopamine infusion was added. Total cardiopulmonary bypass time for the operation was 145 minutes.  Followup transesophageal echocardiogram performed after separation from bypass revealed a well-seated annuloplasty ring in the mitral position.  The mitral  valve is functioning normally.  There was no residual mitral regurgitation.  Mean transvalvular gradient across the mitral valve was estimated 3 mmHg.  There was a well-seated annuloplasty ring in the tricuspid position.  The tricuspid valve was functioning normally.  There was no residual tricuspid regurgitation.  Aortic insufficiency was unchanged from preoperatively.  Left ventricular ejection fraction appeared reduced as expected given the complete resolution of mitral regurgitation.  Right ventricular size and function appeared unchanged.  The aortic cannula was removed uneventfully. Protamine was  administered to reverse the anticoagulation. The femoral venous cannula was removed.  The right internal jugular cannula was removed and manual pressure held on the neck for 30 minutes.  The mediastinum and pleural space were inspected for hemostasis and irrigated with saline solution.   The right groin incision was inspected for hemostasis and subsequently closed in multiple layers.  The mediastinum and both pleural spaces were drained using 4 chest tubes placed through separate stab incisions inferiorly.  The soft tissues anterior to the aorta were reapproximated loosely. The sternum is closed with double strength sternal wire. The soft tissues anterior to the sternum were closed in multiple layers and the skin is closed with a running subcuticular skin closure.  The post-bypass portion of the operation was notable for stable rhythm and hemodynamics.   No blood products were administered during the operation.   Patient Disposition:  The patient tolerated the procedure well and is transported to the surgical intensive care in stable condition. There are no intraoperative complications. All sponge instrument and needle counts are verified correct at completion of the operation.     Valentina Gu. Roxy Manns MD 05/09/2018 1:49 PM

## 2018-05-10 ENCOUNTER — Inpatient Hospital Stay (HOSPITAL_COMMUNITY): Payer: Medicare Other

## 2018-05-10 ENCOUNTER — Encounter (HOSPITAL_COMMUNITY): Payer: Self-pay | Admitting: Thoracic Surgery (Cardiothoracic Vascular Surgery)

## 2018-05-10 DIAGNOSIS — I5033 Acute on chronic diastolic (congestive) heart failure: Secondary | ICD-10-CM | POA: Diagnosis present

## 2018-05-10 DIAGNOSIS — I272 Pulmonary hypertension, unspecified: Secondary | ICD-10-CM | POA: Diagnosis present

## 2018-05-10 DIAGNOSIS — I071 Rheumatic tricuspid insufficiency: Secondary | ICD-10-CM | POA: Diagnosis present

## 2018-05-10 LAB — BPAM FFP
Blood Product Expiration Date: 201912172359
Blood Product Expiration Date: 201912172359
ISSUE DATE / TIME: 201912121702
ISSUE DATE / TIME: 201912121702
Unit Type and Rh: 1700
Unit Type and Rh: 7300

## 2018-05-10 LAB — POCT I-STAT 3, ART BLOOD GAS (G3+)
Acid-base deficit: 5 mmol/L — ABNORMAL HIGH (ref 0.0–2.0)
Acid-base deficit: 6 mmol/L — ABNORMAL HIGH (ref 0.0–2.0)
Acid-base deficit: 5 mmol/L — ABNORMAL HIGH (ref 0.0–2.0)
Acid-base deficit: 6 mmol/L — ABNORMAL HIGH (ref 0.0–2.0)
Acid-base deficit: 7 mmol/L — ABNORMAL HIGH (ref 0.0–2.0)
Bicarbonate: 20.4 mmol/L (ref 20.0–28.0)
Bicarbonate: 19.2 mmol/L — ABNORMAL LOW (ref 20.0–28.0)
Bicarbonate: 20.8 mmol/L (ref 20.0–28.0)
Bicarbonate: 19.9 mmol/L — ABNORMAL LOW (ref 20.0–28.0)
Bicarbonate: 17.7 mmol/L — ABNORMAL LOW (ref 20.0–28.0)
O2 Saturation: 95 %
O2 Saturation: 93 %
O2 Saturation: 96 %
O2 Saturation: 94 %
O2 Saturation: 91 %
Patient temperature: 37.3
Patient temperature: 37.2
Patient temperature: 36.9
Patient temperature: 36.7
Patient temperature: 97.7
TCO2: 22 mmol/L (ref 22–32)
TCO2: 20 mmol/L — ABNORMAL LOW (ref 22–32)
TCO2: 22 mmol/L (ref 22–32)
TCO2: 21 mmol/L — ABNORMAL LOW (ref 22–32)
TCO2: 19 mmol/L — ABNORMAL LOW (ref 22–32)
pCO2 arterial: 40.5 mmHg (ref 32.0–48.0)
pCO2 arterial: 38.5 mmHg (ref 32.0–48.0)
pCO2 arterial: 43 mmHg (ref 32.0–48.0)
pCO2 arterial: 41.4 mmHg (ref 32.0–48.0)
pCO2 arterial: 30.7 mmHg — ABNORMAL LOW (ref 32.0–48.0)
pH, Arterial: 7.313 — ABNORMAL LOW (ref 7.350–7.450)
pH, Arterial: 7.308 — ABNORMAL LOW (ref 7.350–7.450)
pH, Arterial: 7.292 — ABNORMAL LOW (ref 7.350–7.450)
pH, Arterial: 7.288 — ABNORMAL LOW (ref 7.350–7.450)
pH, Arterial: 7.367 (ref 7.350–7.450)
pO2, Arterial: 82 mmHg — ABNORMAL LOW (ref 83.0–108.0)
pO2, Arterial: 73 mmHg — ABNORMAL LOW (ref 83.0–108.0)
pO2, Arterial: 92 mmHg (ref 83.0–108.0)
pO2, Arterial: 79 mmHg — ABNORMAL LOW (ref 83.0–108.0)
pO2, Arterial: 59 mmHg — ABNORMAL LOW (ref 83.0–108.0)

## 2018-05-10 LAB — CBC
HCT: 27.6 % — ABNORMAL LOW (ref 36.0–46.0)
HCT: 29.6 % — ABNORMAL LOW (ref 36.0–46.0)
Hemoglobin: 8.9 g/dL — ABNORMAL LOW (ref 12.0–15.0)
Hemoglobin: 9.1 g/dL — ABNORMAL LOW (ref 12.0–15.0)
MCH: 26.4 pg (ref 26.0–34.0)
MCH: 26.1 pg (ref 26.0–34.0)
MCHC: 32.2 g/dL (ref 30.0–36.0)
MCHC: 30.7 g/dL (ref 30.0–36.0)
MCV: 81.9 fL (ref 80.0–100.0)
MCV: 84.8 fL (ref 80.0–100.0)
Platelets: 139 10*3/uL — ABNORMAL LOW (ref 150–400)
Platelets: 127 10*3/uL — ABNORMAL LOW (ref 150–400)
RBC: 3.37 MIL/uL — ABNORMAL LOW (ref 3.87–5.11)
RBC: 3.49 MIL/uL — ABNORMAL LOW (ref 3.87–5.11)
RDW: 15.8 % — ABNORMAL HIGH (ref 11.5–15.5)
RDW: 16.8 % — ABNORMAL HIGH (ref 11.5–15.5)
WBC: 12.4 10*3/uL — ABNORMAL HIGH (ref 4.0–10.5)
WBC: 13.9 10*3/uL — ABNORMAL HIGH (ref 4.0–10.5)
nRBC: 0 % (ref 0.0–0.2)
nRBC: 0 % (ref 0.0–0.2)

## 2018-05-10 LAB — BASIC METABOLIC PANEL
Anion gap: 8 (ref 5–15)
BUN: 13 mg/dL (ref 8–23)
CO2: 21 mmol/L — ABNORMAL LOW (ref 22–32)
Calcium: 7.6 mg/dL — ABNORMAL LOW (ref 8.9–10.3)
Chloride: 111 mmol/L (ref 98–111)
Creatinine, Ser: 1.07 mg/dL — ABNORMAL HIGH (ref 0.44–1.00)
GFR calc Af Amer: 59 mL/min — ABNORMAL LOW (ref >60)
GFR calc non Af Amer: 51 mL/min — ABNORMAL LOW (ref >60)
Glucose, Bld: 125 mg/dL — ABNORMAL HIGH (ref 70–99)
Potassium: 4.1 mmol/L (ref 3.5–5.1)
Sodium: 140 mmol/L (ref 135–145)

## 2018-05-10 LAB — BPAM PLATELET PHERESIS
Blood Product Expiration Date: 201912132359
Blood Product Expiration Date: 201912132359
ISSUE DATE / TIME: 201912121553
ISSUE DATE / TIME: 201912121702
Unit Type and Rh: 6200
Unit Type and Rh: 6200

## 2018-05-10 LAB — PREPARE PLATELET PHERESIS
Unit division: 0
Unit division: 0

## 2018-05-10 LAB — POCT I-STAT, CHEM 8
BUN: 12 mg/dL (ref 8–23)
Calcium, Ion: 1.15 mmol/L (ref 1.15–1.40)
Chloride: 108 mmol/L (ref 98–111)
Creatinine, Ser: 1 mg/dL (ref 0.44–1.00)
Glucose, Bld: 182 mg/dL — ABNORMAL HIGH (ref 70–99)
HCT: 30 % — ABNORMAL LOW (ref 36.0–46.0)
Hemoglobin: 10.2 g/dL — ABNORMAL LOW (ref 12.0–15.0)
Potassium: 4.4 mmol/L (ref 3.5–5.1)
Sodium: 140 mmol/L (ref 135–145)
TCO2: 22 mmol/L (ref 22–32)

## 2018-05-10 LAB — GLUCOSE, CAPILLARY
Glucose-Capillary: 112 mg/dL — ABNORMAL HIGH (ref 70–99)
Glucose-Capillary: 112 mg/dL — ABNORMAL HIGH (ref 70–99)
Glucose-Capillary: 112 mg/dL — ABNORMAL HIGH (ref 70–99)
Glucose-Capillary: 112 mg/dL — ABNORMAL HIGH (ref 70–99)
Glucose-Capillary: 113 mg/dL — ABNORMAL HIGH (ref 70–99)
Glucose-Capillary: 115 mg/dL — ABNORMAL HIGH (ref 70–99)
Glucose-Capillary: 116 mg/dL — ABNORMAL HIGH (ref 70–99)
Glucose-Capillary: 116 mg/dL — ABNORMAL HIGH (ref 70–99)
Glucose-Capillary: 121 mg/dL — ABNORMAL HIGH (ref 70–99)
Glucose-Capillary: 122 mg/dL — ABNORMAL HIGH (ref 70–99)
Glucose-Capillary: 134 mg/dL — ABNORMAL HIGH (ref 70–99)
Glucose-Capillary: 136 mg/dL — ABNORMAL HIGH (ref 70–99)
Glucose-Capillary: 152 mg/dL — ABNORMAL HIGH (ref 70–99)

## 2018-05-10 LAB — PREPARE FRESH FROZEN PLASMA: Unit division: 0

## 2018-05-10 LAB — MAGNESIUM
Magnesium: 2.7 mg/dL — ABNORMAL HIGH (ref 1.7–2.4)
Magnesium: 2.5 mg/dL — ABNORMAL HIGH (ref 1.7–2.4)

## 2018-05-10 LAB — COOXEMETRY PANEL
Carboxyhemoglobin: 1.7 % — ABNORMAL HIGH (ref 0.5–1.5)
Methemoglobin: 1.2 % (ref 0.0–1.5)
O2 Saturation: 78.9 %
Total hemoglobin: 10.8 g/dL — ABNORMAL LOW (ref 12.0–16.0)

## 2018-05-10 LAB — CREATININE, SERUM
Creatinine, Ser: 1.24 mg/dL — ABNORMAL HIGH (ref 0.44–1.00)
GFR calc Af Amer: 50 mL/min — ABNORMAL LOW (ref >60)
GFR calc non Af Amer: 43 mL/min — ABNORMAL LOW (ref >60)

## 2018-05-10 MED ORDER — ORAL CARE MOUTH RINSE
15.0000 mL | Freq: Two times a day (BID) | OROMUCOSAL | Status: DC
  Administered 2018-05-10 – 2018-05-20 (×13): 15 mL via OROMUCOSAL

## 2018-05-10 MED ORDER — ATORVASTATIN CALCIUM 10 MG PO TABS
20.0000 mg | ORAL_TABLET | Freq: Every day | ORAL | Status: DC
  Administered 2018-05-11 – 2018-05-20 (×10): 20 mg via ORAL
  Filled 2018-05-10 (×10): qty 2

## 2018-05-10 MED ORDER — INSULIN DETEMIR 100 UNIT/ML ~~LOC~~ SOLN
20.0000 [IU] | Freq: Every day | SUBCUTANEOUS | Status: DC
  Administered 2018-05-10 – 2018-05-12 (×3): 20 [IU] via SUBCUTANEOUS
  Filled 2018-05-10 (×4): qty 0.2

## 2018-05-10 MED ORDER — WARFARIN SODIUM 2.5 MG PO TABS
2.5000 mg | ORAL_TABLET | Freq: Every day | ORAL | Status: DC
  Administered 2018-05-10 – 2018-05-16 (×7): 2.5 mg via ORAL
  Filled 2018-05-10 (×7): qty 1

## 2018-05-10 MED ORDER — FLUOXETINE HCL 10 MG PO CAPS
10.0000 mg | ORAL_CAPSULE | Freq: Every day | ORAL | Status: DC
  Administered 2018-05-10 – 2018-05-13 (×4): 10 mg via ORAL
  Filled 2018-05-10 (×5): qty 1

## 2018-05-10 MED ORDER — WARFARIN - PHYSICIAN DOSING INPATIENT
Freq: Every day | Status: DC
  Administered 2018-05-15 – 2018-05-18 (×2)

## 2018-05-10 MED ORDER — INSULIN ASPART 100 UNIT/ML ~~LOC~~ SOLN
0.0000 [IU] | SUBCUTANEOUS | Status: DC
  Administered 2018-05-10 – 2018-05-11 (×2): 2 [IU] via SUBCUTANEOUS

## 2018-05-10 MED ORDER — HALOPERIDOL LACTATE 5 MG/ML IJ SOLN
10.0000 mg | Freq: Once | INTRAMUSCULAR | Status: AC
  Administered 2018-05-10: 10 mg via INTRAVENOUS

## 2018-05-10 MED ORDER — FUROSEMIDE 10 MG/ML IJ SOLN
40.0000 mg | Freq: Two times a day (BID) | INTRAMUSCULAR | Status: DC
  Administered 2018-05-11 – 2018-05-12 (×3): 40 mg via INTRAVENOUS
  Filled 2018-05-10 (×3): qty 4

## 2018-05-10 MED ORDER — TRAZODONE HCL 50 MG PO TABS
50.0000 mg | ORAL_TABLET | Freq: Every day | ORAL | Status: DC
  Administered 2018-05-10 – 2018-05-13 (×4): 50 mg via ORAL
  Filled 2018-05-10 (×4): qty 1

## 2018-05-10 MED ORDER — HALOPERIDOL LACTATE 5 MG/ML IJ SOLN
INTRAMUSCULAR | Status: AC
  Filled 2018-05-10: qty 2

## 2018-05-10 MED ORDER — ENOXAPARIN SODIUM 30 MG/0.3ML ~~LOC~~ SOLN
30.0000 mg | Freq: Every day | SUBCUTANEOUS | Status: DC
  Administered 2018-05-11 – 2018-05-15 (×5): 30 mg via SUBCUTANEOUS
  Filled 2018-05-10 (×5): qty 0.3

## 2018-05-10 MED FILL — Potassium Chloride Inj 2 mEq/ML: INTRAVENOUS | Qty: 40 | Status: AC

## 2018-05-10 MED FILL — Heparin Sodium (Porcine) Inj 1000 Unit/ML: INTRAMUSCULAR | Qty: 30 | Status: AC

## 2018-05-10 MED FILL — Magnesium Sulfate Inj 50%: INTRAMUSCULAR | Qty: 10 | Status: AC

## 2018-05-10 NOTE — Anesthesia Postprocedure Evaluation (Signed)
Anesthesia Post Note  Patient: Diane [name]  Procedure(s) Performed: TRANSESOPHAGEAL ECHOCARDIOGRAM (TEE) (N/A ) CLIPPING OF ATRIAL APPENDAGE USING ATRICLIP PRO2 CLIP SIZE 45MM (N/A Chest) MAZE (N/A Chest) MITRAL VALVE REPAIR (MVR) USING MEMO 4D RING SIZE 26MM (N/A Chest) TRICUSPID VALVE REPAIR USING MC3 RING SIZE 28MM (N/A Chest)     Patient location during evaluation: SICU Anesthesia Type: General Level of consciousness: sedated Pain management: pain level controlled Vital Signs Assessment: post-procedure vital signs reviewed and stable Respiratory status: patient remains intubated per anesthesia plan Cardiovascular status: stable Postop Assessment: no apparent nausea or vomiting Anesthetic complications: no    Last Vitals:  Vitals:   05/10/18 1800 05/10/18 2015  BP: (!) 123/59   Pulse: 80   Resp: (!) 27   Temp:  36.5 C  SpO2: 97%     Last Pain:  Vitals:   05/10/18 2015  TempSrc: Oral  PainSc:                  CHRISTOPHER MOSER

## 2018-05-10 NOTE — Progress Notes (Signed)
[facility] BRIEF SICU PROGRESS NOTE  1 Day Post-Op  S/P Procedure(s) (LRB): TRANSESOPHAGEAL ECHOCARDIOGRAM (TEE) (N/A) CLIPPING OF ATRIAL APPENDAGE USING ATRICLIP PRO2 CLIP SIZE 45MM (N/A) MAZE (N/A) MITRAL VALVE REPAIR (MVR) USING MEMO 4D RING SIZE 26MM (N/A) TRICUSPID VALVE REPAIR USING MC3 RING SIZE 28MM (N/A)   Looks good.  Up in chair.  No complaints AAI paced w/ stable BP off all drips except Dopamine 3 Breathing comfortably O2 sats 97-100% UOP adequate Labs okay  Plan: Continue routine care  Rexene Alberts, MD 05/10/2018 6:26 PM

## 2018-05-10 NOTE — Progress Notes (Signed)
Subjective:  Complains of mild soreness in the chest.  Overall doing well.  Inotropes being weaned off.  AV sequential paced rhythm on the monitor baseline junctional rhythm.  Objective:  Vital Signs in the last 24 hours: Temp:  [95 F (35 C)-99.7 F (37.6 C)] 97.9 F (36.6 C) (12/13 0800) Pulse Rate:  [68-80] 80 (12/13 0800) Resp:  [0-76] 25 (12/13 0800) BP: (95-139)/(45-86) 111/48 (12/13 0800) SpO2:  [95 %-100 %] 100 % (12/13 0800) Arterial Line BP: (107-194)/(43-82) 151/52 (12/13 0800) FiO2 (%):  [40 %-50 %] 40 % (12/13 0048) Weight:  [93.6 kg] 93.6 kg (12/13 0500)  Intake/Output from previous day: 12/12 0701 - 12/13 0700 In: 9283.8 [I.V.:5406; MCNOB:0962; IV Piggyback:1204.9] Out: 3929 [Urine:2009; Blood:650; Chest Tube:1270] Intake/Output from this shift: Total I/O In: 51.3 [I.V.:51.3] Out: 110 [Urine:60; Chest Tube:50]  Physical Exam: Neck: no adenopathy, no carotid bruit, no JVD and supple, symmetrical, trachea midline Lungs: clear to auscultation anterolaterally Heart: regular rate and rhythm and S1, S2 normal Abdomen: soft, non-tender; bowel sounds normal; no masses,  no organomegaly Extremities: extremities normal, atraumatic, no cyanosis or edema  Lab Results: Recent Labs    05/09/18 2031 05/09/18 2039 05/10/18 0333  WBC 9.9  --  12.4*  HGB 9.2* 10.5* 8.9*  PLT 145*  --  139*   Recent Labs    05/09/18 0336  05/09/18 2039 05/10/18 0333  NA 138   < > 141 140  K 4.4   < > 4.1 4.1  CL 112*   < > 109 111  CO2 18*  --   --  21*  GLUCOSE 95   < > 124* 125*  BUN 19   < > 15 13  CREATININE 1.43*   < > 1.00 1.07*   < > = values in this interval not displayed.   No results for input(s): TROPONINI in the last 72 hours.  Invalid input(s): CK, MB Hepatic Function Panel No results for input(s): PROT, ALBUMIN, AST, ALT, ALKPHOS, BILITOT, BILIDIR, IBILI in the last 72 hours. No results for input(s): CHOL in the last 72 hours. No results for input(s): PROTIME  in the last 72 hours.  Imaging: Imaging results have been reviewed and Dg Chest 2 View  Result Date: 05/08/2018 CLINICAL DATA:  Chronic shortness of breath. EXAM: CHEST - 2 VIEW COMPARISON:  CT chest dated May 06, 2018. Chest x-ray dated May 03, 2018. FINDINGS: Stable cardiomegaly. Mildly increased interstitial markings. Mild bibasilar atelectasis. Small amount of fluid in the right minor fissure. No focal consolidation, pleural effusion, or pneumothorax. No acute osseous abnormality. IMPRESSION: Stable cardiomegaly and mild interstitial edema. Electronically Signed   By: Titus Dubin M.D.   On: 05/08/2018 17:09   Dg Chest Port 1 View  Result Date: 05/10/2018 CLINICAL DATA:  77 year old female status post CABG with atelectasis. Chest tube in place. EXAM: PORTABLE CHEST 1 VIEW COMPARISON:  Radiograph dated 05/09/2018 FINDINGS: There has been interval removal of the endotracheal tube and the enteric. Left-sided Swan-Ganz catheter and inferior accessed drainage catheter in similar position. Bilateral chest tubes in similar position as previous. Multiple additional wires overlie the patient. There is shallow inspiration with bibasilar densities, likely atelectatic changes. Pneumonia is not excluded. No large pleural effusion. No pneumothorax. Stable cardiomegaly. Median sternotomy wires and mitral valve replacement. No acute osseous pathology. IMPRESSION: 1. Interval removal of endotracheal and enteric tubes. Additional support tubes in similar position as before. 2. Cardiomegaly with overall improvement of the vascular congestion/edema compared to the prior  radiograph. 3. Shallow inspiration with bibasilar atelectasis versus infiltrate. Electronically Signed   By: Anner Crete M.D.   On: 05/10/2018 06:41   Dg Chest Port 1 View  Result Date: 05/09/2018 CLINICAL DATA:  Followup mitral valve replacement. EXAM: PORTABLE CHEST 1 VIEW COMPARISON:  05/08/2018 FINDINGS: Interval median sternotomy  and mitral valve replacement. Endotracheal tube tip 3 cm above the carina. Nasogastric tube tip probably within a small hiatal hernia. Left internal jugular Swan-Ganz catheter tip in the right main pulmonary artery. Bilateral chest tubes in place. No pneumothorax. Mild edema pattern. Mild basilar atelectasis. IMPRESSION: Status post mitral valve replacement. Lines and tubes well positioned. No pneumothorax. Mild edema pattern. Mild basilar atelectasis. Electronically Signed   By: Nelson Chimes M.D.   On: 05/09/2018 14:35    Cardiac Studies:  Assessment/Plan:  Severe symptomatic mitral regurgitation/tricuspid regurgitation status post MVR/TVR and annuloplasty/Maze procedure and left atrial appendage clipping postop day one, doing well Pulmonary hypertension. 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 Hypertension. Diabetes mellitus. Hyperlipidemia. Osteoarthritis Plan Continue present management as per CVTS Encouraged incentive spirometry   LOS: 7 days    [name] 05/10/2018, 11:48 AM

## 2018-05-10 NOTE — Progress Notes (Signed)
Attempting to wean patient for extubation. Instructing patient to take slow deep breaths.

## 2018-05-10 NOTE — Progress Notes (Signed)
[address]       [place] [address]             [phone]        CARDIOTHORACIC SURGERY PROGRESS NOTE   R1 Day Post-Op Procedure(s) (LRB): TRANSESOPHAGEAL ECHOCARDIOGRAM (TEE) (N/A) CLIPPING OF ATRIAL APPENDAGE USING ATRICLIP PRO2 CLIP SIZE 45MM (N/A) MAZE (N/A) MITRAL VALVE REPAIR (MVR) USING MEMO 4D RING SIZE 26MM (N/A) TRICUSPID VALVE REPAIR USING MC3 RING SIZE 28MM (N/A)  Subjective: Looks good.  Mild soreness in chest.  No SOB.  Feels well.  Objective: Vital signs: BP Readings from Last 1 Encounters:  05/10/18 (!) 117/52   Pulse Readings from Last 1 Encounters:  05/10/18 80   Resp Readings from Last 1 Encounters:  05/10/18 (!) 25   Temp Readings from Last 1 Encounters:  05/10/18 97.9 F (36.6 C)    Hemodynamics: PAP: (43-74)/(18-41) 56/18 CO:  [2.1 L/min-5.5 L/min] 5.5 L/min CI:  [1.8 L/min/m2-2.7 L/min/m2] 2.7 L/min/m2  Mixed venous co-ox 78%   Physical Exam:  Rhythm:   Junctional vs ectopic atrial rhythm, AAI paced  Breath sounds: clear  Heart sounds:  RRR w/out murmur  Incisions:  Dressings dry, intact  Abdomen:  Soft, non-distended, non-tender  Extremities:  Warm, well-perfused  Chest tubes:  low volume thin serosanguinous output, no air leak    Intake/Output from previous day: 12/12 0701 - 12/13 0700 In: 9283.8 [I.V.:5406; EVOJJ:0093; IV Piggyback:1204.9] Out: 3929 [Urine:2009; Blood:650; Chest Tube:1270] Intake/Output this shift: No intake/output data recorded.  Lab Results:  CBC: Recent Labs    05/09/18 2031 05/09/18 2039 05/10/18 0333  WBC 9.9  --  12.4*  HGB 9.2* 10.5* 8.9*  HCT 28.5* 31.0* 27.6*  PLT 145*  --  139*    BMET:  Recent Labs    05/09/18 0336  05/09/18 2039 05/10/18 0333  NA 138   < > 141 140  K 4.4   < > 4.1 4.1  CL 112*   < > 109 111  CO2 18*  --   --  21*  GLUCOSE 95   < > 124* 125*  BUN 19   < > 15 13  CREATININE 1.43*   < > 1.00 1.07*  CALCIUM 8.4*  --   --  7.6*   < > =  values in this interval not displayed.     PT/INR:   Recent Labs    05/09/18 1426  LABPROT 21.4*  INR 1.88    CBG (last 3)  Recent Labs    05/10/18 0503 05/10/18 0607 05/10/18 0659  GLUCAP 112* 113* 116*    ABG    Component Value Date/Time   PHART 7.288 (L) 05/10/2018 0614   PCO2ART 41.4 05/10/2018 0614   PO2ART 79.0 (L) 05/10/2018 0614   HCO3 19.9 (L) 05/10/2018 0614   TCO2 21 (L) 05/10/2018 0614   ACIDBASEDEF 6.0 (H) 05/10/2018 0614   O2SAT 78.9 05/10/2018 0710    CXR: PORTABLE CHEST 1 VIEW  COMPARISON:  Radiograph dated 05/09/2018  FINDINGS: There has been interval removal of the endotracheal tube and the enteric. Left-sided Swan-Ganz catheter and inferior accessed drainage catheter in similar position. Bilateral chest tubes in similar position as previous. Multiple additional wires overlie the patient. There is shallow inspiration with bibasilar densities, likely atelectatic changes. Pneumonia is not excluded. No large pleural effusion. No pneumothorax. Stable cardiomegaly. Median sternotomy wires and mitral valve replacement. No acute osseous pathology.  IMPRESSION: 1. Interval removal of endotracheal and  enteric tubes. Additional support tubes in similar position as before. 2. Cardiomegaly with overall improvement of the vascular congestion/edema compared to the prior radiograph. 3. Shallow inspiration with bibasilar atelectasis versus infiltrate.   Electronically Signed   By: Anner Crete M.D.   On: 05/10/2018 06:41  Assessment/Plan: S/P Procedure(s) (LRB): TRANSESOPHAGEAL ECHOCARDIOGRAM (TEE) (N/A) CLIPPING OF ATRIAL APPENDAGE USING ATRICLIP PRO2 CLIP SIZE 45MM (N/A) MAZE (N/A) MITRAL VALVE REPAIR (MVR) USING MEMO 4D RING SIZE 26MM (N/A) TRICUSPID VALVE REPAIR USING MC3 RING SIZE 28MM (N/A)  Doing very well POD1 Maintaining AAI paced rhythm w/ stable hemodynamics on milrinone 0.5 dopamine 3 and very low dose Neo Breathing  comfortably w/ O2 sats 100% on Reform, CXR looks good Acute on chronic diastolic CHF with expected post-op volume excess, weight up 6 kg postop Longstanding persistent atrial fibrillation, maintaining junctional rhythm s/p maze procedure Preop chronic anemia w/ expected post op acute blood loss anemia, Hgb 8.9 stable Expected post op atelectasis, mild CKD stage III, creatinine below preop baseline   Mobilize  Wean milrinone slowly  Wean Neo as tolerated  D/C Luiz Blare, Aline  D/C tubes later today or tomorrow, depending on output  Start Coumadin slowly   Rexene Alberts, MD 05/10/2018 8:11 AM

## 2018-05-10 NOTE — Progress Notes (Signed)
This RN continuously pushed pulmonary toileting for the pt throughout the night with help from charge RN and RT Tim with continued decreased compliance from the patient. Pt verbalized understanding of how to use IS and this RN continuously explained it's importance for progression.   This RN coached and encouraged the pt throughout. Pt somewhat lethargic through the night, with eyes opening to voice but poor attention/concentration. Took pt approx. 15 minutes to pull 250 on IS x5. After completion of toileting, patient would speak in full sentences towards RN stating "You don't know what deep breathing is" at one point. This has been reported off to day shift RN.

## 2018-05-10 NOTE — Progress Notes (Signed)
Pt unable to perform IS with verbal coaching by this RN, charge RN, and RT. With max patient effort reaching 250 x 3. Explained the importance of deep breathing and using the IS. Patient verbally said she understood continued to push IS away from her mouth.   1 hour post extubation gas performed. See Results Review. WNL. Will continue to monitor patient closely post extubation and continue to encourage IS.

## 2018-05-10 NOTE — Progress Notes (Signed)
Pt meets parameters to wean. Able to lift head off bed for >5 seconds, moving all extremities to command. Cuff leak noted. Coordinated with RT to begin weaning. Instructed patient on taking slow deep breaths with reminders. Will monitor patient closely during weaning process.

## 2018-05-10 NOTE — Progress Notes (Addendum)
-  Pt increasingly confused and agitated, halucinating- pulling at lines/ tubes and unable to be redirected. Pt stated she felt something "sitting on her chest". Pt hemodynamically stable despite new agitation confusion.ABG and CXR obtained and MD Roxy Manns notified, orders given for IV Haldol. -Chest tubes filled w/ copious large clots- after manipulating chest tubes to rid clots-  120 ml out from PT chest tubes. Pt was dangled/ stood at bedside - 90 ml additional out from PT tubes.

## 2018-05-10 NOTE — Procedures (Signed)
Extubation Procedure Note  Patient Details:   Name: Diane [name] DOB: [birth year] MRN: [id number]   Airway Documentation:  Airway 8 mm (Active)  Secured at (cm) 23 cm 05/10/2018 12:48 AM  Measured From Lips 05/10/2018 12:48 AM  Secured Location Right 05/10/2018 12:48 AM  Secured By Rana Snare Tape 05/10/2018 12:48 AM  Cuff Pressure (cm H2O) 24 cm H2O 05/10/2018 12:25 AM  Site Condition Dry 05/10/2018 12:25 AM   Vent end date: 05/10/18 Vent end time: 0125   Evaluation  O2 sats: stable throughout Complications: No apparent complications Patient did tolerate procedure well. Bilateral Breath Sounds: Clear, Diminished  NIF=-20 Vital Capacity=.700 No stridor heard over upper airways. Yes  Ulice Dash 05/10/2018, 1:29 AM

## 2018-05-11 ENCOUNTER — Inpatient Hospital Stay (HOSPITAL_COMMUNITY): Payer: Medicare Other

## 2018-05-11 ENCOUNTER — Inpatient Hospital Stay: Payer: Self-pay

## 2018-05-11 LAB — GLUCOSE, CAPILLARY
Glucose-Capillary: 106 mg/dL — ABNORMAL HIGH (ref 70–99)
Glucose-Capillary: 91 mg/dL (ref 70–99)
Glucose-Capillary: 112 mg/dL — ABNORMAL HIGH (ref 70–99)
Glucose-Capillary: 125 mg/dL — ABNORMAL HIGH (ref 70–99)
Glucose-Capillary: 87 mg/dL (ref 70–99)

## 2018-05-11 LAB — COOXEMETRY PANEL
Carboxyhemoglobin: 1.6 % — ABNORMAL HIGH (ref 0.5–1.5)
Methemoglobin: 1.2 % (ref 0.0–1.5)
O2 Saturation: 84.9 %
Total hemoglobin: 9.1 g/dL — ABNORMAL LOW (ref 12.0–16.0)

## 2018-05-11 LAB — BASIC METABOLIC PANEL
Anion gap: 7 (ref 5–15)
BUN: 10 mg/dL (ref 8–23)
CO2: 20 mmol/L — ABNORMAL LOW (ref 22–32)
Calcium: 8 mg/dL — ABNORMAL LOW (ref 8.9–10.3)
Chloride: 110 mmol/L (ref 98–111)
Creatinine, Ser: 1.15 mg/dL — ABNORMAL HIGH (ref 0.44–1.00)
GFR calc Af Amer: 54 mL/min — ABNORMAL LOW (ref >60)
GFR calc non Af Amer: 47 mL/min — ABNORMAL LOW (ref >60)
Glucose, Bld: 126 mg/dL — ABNORMAL HIGH (ref 70–99)
Potassium: 4.3 mmol/L (ref 3.5–5.1)
Sodium: 137 mmol/L (ref 135–145)

## 2018-05-11 LAB — CBC
HCT: 30.3 % — ABNORMAL LOW (ref 36.0–46.0)
Hemoglobin: 9.5 g/dL — ABNORMAL LOW (ref 12.0–15.0)
MCH: 26 pg (ref 26.0–34.0)
MCHC: 31.4 g/dL (ref 30.0–36.0)
MCV: 82.8 fL (ref 80.0–100.0)
Platelets: 115 10*3/uL — ABNORMAL LOW (ref 150–400)
RBC: 3.66 MIL/uL — ABNORMAL LOW (ref 3.87–5.11)
RDW: 16.3 % — ABNORMAL HIGH (ref 11.5–15.5)
WBC: 11.7 10*3/uL — ABNORMAL HIGH (ref 4.0–10.5)
nRBC: 0 % (ref 0.0–0.2)

## 2018-05-11 LAB — PROTIME-INR
INR: 1.59
Prothrombin Time: 18.8 seconds — ABNORMAL HIGH (ref 11.4–15.2)

## 2018-05-11 MED ORDER — MOVING RIGHT ALONG BOOK
Freq: Once | Status: AC
  Administered 2018-05-11: 13:00:00
  Filled 2018-05-11: qty 1

## 2018-05-11 MED ORDER — SODIUM CHLORIDE 0.9% FLUSH
10.0000 mL | Freq: Two times a day (BID) | INTRAVENOUS | Status: DC
  Administered 2018-05-11 – 2018-05-19 (×9): 10 mL via INTRAVENOUS

## 2018-05-11 MED ORDER — HALOPERIDOL LACTATE 5 MG/ML IJ SOLN: 10.0000 mg | Freq: Four times a day (QID) | INTRAMUSCULAR | Status: DC | PRN

## 2018-05-11 MED ORDER — ASPIRIN EC 81 MG PO TBEC
81.0000 mg | DELAYED_RELEASE_TABLET | Freq: Every day | ORAL | Status: DC
  Administered 2018-05-12 – 2018-05-20 (×9): 81 mg via ORAL
  Filled 2018-05-11 (×9): qty 1

## 2018-05-11 MED ORDER — SODIUM CHLORIDE 0.9% FLUSH: 10.0000 mL | INTRAVENOUS | Status: DC | PRN

## 2018-05-11 MED ORDER — INSULIN ASPART 100 UNIT/ML ~~LOC~~ SOLN
0.0000 [IU] | Freq: Three times a day (TID) | SUBCUTANEOUS | Status: DC
  Administered 2018-05-11 – 2018-05-16 (×4): 2 [IU] via SUBCUTANEOUS

## 2018-05-11 MED ORDER — CHLORHEXIDINE GLUCONATE CLOTH 2 % EX PADS
6.0000 | MEDICATED_PAD | Freq: Every day | CUTANEOUS | Status: DC
  Administered 2018-05-13 – 2018-05-15 (×3): 6 via TOPICAL

## 2018-05-11 NOTE — Progress Notes (Signed)
Subjective:  Events of last night noted.  Patient alert awake this a.m. up in chair complaining of mild shortness of breath denies any chest pain.  Urine output adequate renal function stable.  V paced on the monitor.  Patient is off all inotropes  Objective:  Vital Signs in the last 24 hours: Temp:  [97.6 F (36.4 C)-98.3 F (36.8 C)] 97.9 F (36.6 C) (12/14 0704) Pulse Rate:  [77-84] 83 (12/14 0800) Resp:  [15-27] 20 (12/14 0800) BP: (104-148)/(53-90) 138/65 (12/14 0800) SpO2:  [96 %-100 %] 100 % (12/14 0800) Arterial Line BP: (149-156)/(48-62) 156/62 (12/13 1500) Weight:  [89.6 kg] 89.6 kg (12/14 0500)  Intake/Output from previous day: 12/13 0701 - 12/14 0700 In: 776 [I.V.:675.9; IV Piggyback:100.1] Out: 1895 [Urine:1075; Chest Tube:820] Intake/Output from this shift: No intake/output data recorded.  Physical Exam: Neck: no adenopathy, no carotid bruit, no JVD and supple, symmetrical, trachea midline Lungs: Decreased breath sounds at bases clear anteriorly Heart: regular rate and rhythm and S1, S2 normal Abdomen: soft, non-tender; bowel sounds normal; no masses,  no organomegaly Extremities: No clubbing cyanosis 1+ edema noted  Lab Results: Recent Labs    05/10/18 1628 05/10/18 1737 05/11/18 0411  WBC 13.9*  --  11.7*  HGB 9.1* 10.2* 9.5*  PLT 127*  --  115*   Recent Labs    05/10/18 0333  05/10/18 1737 05/11/18 0315  NA 140  --  140 137  K 4.1  --  4.4 4.3  CL 111  --  108 110  CO2 21*  --   --  20*  GLUCOSE 125*  --  182* 126*  BUN 13  --  12 10  CREATININE 1.07*   < > 1.00 1.15*   < > = values in this interval not displayed.   No results for input(s): TROPONINI in the last 72 hours.  Invalid input(s): CK, MB Hepatic Function Panel No results for input(s): PROT, ALBUMIN, AST, ALT, ALKPHOS, BILITOT, BILIDIR, IBILI in the last 72 hours. No results for input(s): CHOL in the last 72 hours. No results for input(s): PROTIME in the last 72  hours.  Imaging: Imaging results have been reviewed and Dg Chest Port 1 View  Result Date: 05/10/2018 CLINICAL DATA:  Postop EXAM: PORTABLE CHEST 1 VIEW COMPARISON:  05/10/2018, 05/08/2018, 05/03/2018 FINDINGS: Post sternotomy changes. Mediastinal and chest drainage catheters are similar in position. Cardiomegaly with vascular congestion and mild pulmonary edema. Small pleural effusions. Bibasilar airspace disease without interval change. No pneumothorax. Removal of left IJ Swan-Ganz catheter. IMPRESSION: 1. Removal of left IJ Swan-Ganz catheter 2. Similar positioning of chest and mediastinal drainage catheters. 3. No significant change in moderate cardiomegaly, vascular congestion and mild edema. Persistent small pleural effusions and bibasilar airspace disease without significant change. Electronically Signed   By: Donavan Foil M.D.   On: 05/10/2018 23:19   Dg Chest Port 1 View  Result Date: 05/10/2018 CLINICAL DATA:  77 year old female status post CABG with atelectasis. Chest tube in place. EXAM: PORTABLE CHEST 1 VIEW COMPARISON:  Radiograph dated 05/09/2018 FINDINGS: There has been interval removal of the endotracheal tube and the enteric. Left-sided Swan-Ganz catheter and inferior accessed drainage catheter in similar position. Bilateral chest tubes in similar position as previous. Multiple additional wires overlie the patient. There is shallow inspiration with bibasilar densities, likely atelectatic changes. Pneumonia is not excluded. No large pleural effusion. No pneumothorax. Stable cardiomegaly. Median sternotomy wires and mitral valve replacement. No acute osseous pathology. IMPRESSION: 1. Interval removal of  endotracheal and enteric tubes. Additional support tubes in similar position as before. 2. Cardiomegaly with overall improvement of the vascular congestion/edema compared to the prior radiograph. 3. Shallow inspiration with bibasilar atelectasis versus infiltrate. Electronically Signed    By: Anner Crete M.D.   On: 05/10/2018 06:41   Dg Chest Port 1 View  Result Date: 05/09/2018 CLINICAL DATA:  Followup mitral valve replacement. EXAM: PORTABLE CHEST 1 VIEW COMPARISON:  05/08/2018 FINDINGS: Interval median sternotomy and mitral valve replacement. Endotracheal tube tip 3 cm above the carina. Nasogastric tube tip probably within a small hiatal hernia. Left internal jugular Swan-Ganz catheter tip in the right main pulmonary artery. Bilateral chest tubes in place. No pneumothorax. Mild edema pattern. Mild basilar atelectasis. IMPRESSION: Status post mitral valve replacement. Lines and tubes well positioned. No pneumothorax. Mild edema pattern. Mild basilar atelectasis. Electronically Signed   By: Nelson Chimes M.D.   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 Hypertension. Diabetes mellitus. Hyperlipidemia. Osteoarthritis Status post acute CCU psychosis Plan Continue present management per CVTS Agree with IV Lasix Encouraged incentive spirometry  LOS: 8 days    [name] 05/11/2018, 9:51 AM

## 2018-05-11 NOTE — Progress Notes (Signed)
Spoke with Lonia Blood (granddaughter) via phone to obtain consent for PICC insertion. She granted consent, this was verified by Joycie Peek, RN. Called vascular access to inform them.

## 2018-05-11 NOTE — Progress Notes (Signed)
Called granddaughter to get consent for PICC placement. No answer. Per Liliane Shi, RN, states family to arrive approximately 1300. Will try when they arrive.

## 2018-05-11 NOTE — Progress Notes (Signed)
Spoke with Liliane Shi, Therapist, sports. Family still unavailable. VAST will check back later today. Pt. Has two working PIVs.

## 2018-05-11 NOTE — Progress Notes (Signed)
[facility] BRIEF SICU PROGRESS NOTE  2 Days Post-Op  S/P Procedure(s) (LRB): TRANSESOPHAGEAL ECHOCARDIOGRAM (TEE) (N/A) CLIPPING OF ATRIAL APPENDAGE USING ATRICLIP PRO2 CLIP SIZE 45MM (N/A) MAZE (N/A) MITRAL VALVE REPAIR (MVR) USING MEMO 4D RING SIZE 26MM (N/A) TRICUSPID VALVE REPAIR USING MC3 RING SIZE 28MM (N/A)   Stable day  Plan: Continue current plan  Rexene Alberts, MD 05/11/2018 5:53 PM

## 2018-05-11 NOTE — Progress Notes (Signed)
Peripherally Inserted Central Catheter/Midline Placement  The IV Nurse has discussed with the patient and/or persons authorized to consent for the patient, the purpose of this procedure and the potential benefits and risks involved with this procedure.  The benefits include less needle sticks, lab draws from the catheter, and the patient may be discharged home with the catheter. Risks include, but not limited to, infection, bleeding, blood clot (thrombus formation), and puncture of an artery; nerve damage and irregular heartbeat and possibility to perform a PICC exchange if needed/ordered by physician.  Alternatives to this procedure were also discussed.  Bard Power PICC patient education guide, fact sheet on infection prevention and patient information card has been provided to patient /or left at bedside.    PICC/Midline Placement Documentation  PICC Single Lumen 15/05/69 PICC Left Cephalic 43 cm 0 cm (Active)  Indication for Insertion or Continuance of Line Administration of hyperosmolar/irritating solutions (i.e. TPN, Vancomycin, etc.);Vasoactive infusions 05/11/2018  6:00 PM  Exposed Catheter (cm) 0 cm 05/11/2018  6:00 PM  Site Assessment Clean;Dry;Intact 05/11/2018  6:00 PM  Line Status Flushed;Blood return noted;Saline locked 05/11/2018  6:00 PM  Dressing Type Transparent 05/11/2018  6:00 PM  Dressing Status Clean;Dry;Intact;Antimicrobial disc in place 05/11/2018  6:00 PM  Line Care Connections checked and tightened 05/11/2018  6:00 PM  Line Adjustment (NICU/IV Team Only) No 05/11/2018  6:00 PM  Dressing Change Due 05/18/18 05/11/2018  6:00 PM       Lorenza Cambridge 05/11/2018, 7:00 PM

## 2018-05-11 NOTE — Progress Notes (Addendum)
Pt is still confused, agitated/ aggressive.. pulling at central lines/ tubes. Sitter placed in Pt room for Pt safety. Pt will not cooperate with any aspects of care- nor allow RN to perform duties.

## 2018-05-11 NOTE — Progress Notes (Signed)
[address]       [place] [address]             [phone]        CARDIOTHORACIC SURGERY PROGRESS NOTE   R2 Days Post-Op Procedure(s) (LRB): TRANSESOPHAGEAL ECHOCARDIOGRAM (TEE) (N/A) CLIPPING OF ATRIAL APPENDAGE USING ATRICLIP PRO2 CLIP SIZE 45MM (N/A) MAZE (N/A) MITRAL VALVE REPAIR (MVR) USING MEMO 4D RING SIZE 26MM (N/A) TRICUSPID VALVE REPAIR USING MC3 RING SIZE 28MM (N/A)  Subjective: Some confusion/agitation overnight.  Currently sleepy but arouses easily and follows commands.  Reports mild pain "everywhere"  Objective: Vital signs: BP Readings from Last 1 Encounters:  05/11/18 (!) 115/59   Pulse Readings from Last 1 Encounters:  05/11/18 86   Resp Readings from Last 1 Encounters:  05/11/18 19   Temp Readings from Last 1 Encounters:  05/11/18 97.9 F (36.6 C) (Oral)    Hemodynamics:   Mixed venous co-ox 85%   Physical Exam:  Rhythm:   Junctional - DDD pacing  Breath sounds: Diminished at bases  Heart sounds:  RRR   Incisions:  Dressings dry, intact  Abdomen:  Soft, non-distended, non-tender  Extremities:  Warm, well-perfused  Chest tubes:  Decreasing but significant volume thin serosanguinous output from pleural tubes, minimal out from mediastinal tubes, no air leak    Intake/Output from previous day: 12/13 0701 - 12/14 0700 In: 776 [I.V.:675.9; IV Piggyback:100.1] Out: 1995 [Urine:1175; Chest Tube:820] Intake/Output this shift: Total I/O In: 95.3 [I.V.:32.9; IV Piggyback:62.4] Out: 405 [Urine:275; Chest Tube:130]  Lab Results:  CBC: Recent Labs    05/10/18 1628 05/10/18 1737 05/11/18 0411  WBC 13.9*  --  11.7*  HGB 9.1* 10.2* 9.5*  HCT 29.6* 30.0* 30.3*  PLT 127*  --  115*    BMET:  Recent Labs    05/10/18 0333  05/10/18 1737 05/11/18 0315  NA 140  --  140 137  K 4.1  --  4.4 4.3  CL 111  --  108 110  CO2 21*  --   --  20*  GLUCOSE 125*  --  182* 126*  BUN 13  --  12 10  CREATININE 1.07*   < > 1.00  1.15*  CALCIUM 7.6*  --   --  8.0*   < > = values in this interval not displayed.     PT/INR:   Recent Labs    05/11/18 0411  LABPROT 18.8*  INR 1.59    CBG (last 3)  Recent Labs    05/10/18 2102 05/10/18 2343 05/11/18 0352  GLUCAP 152* 134* 106*    ABG    Component Value Date/Time   PHART 7.367 05/10/2018 2308   PCO2ART 30.7 (L) 05/10/2018 2308   PO2ART 59.0 (L) 05/10/2018 2308   HCO3 17.7 (L) 05/10/2018 2308   TCO2 19 (L) 05/10/2018 2308   ACIDBASEDEF 7.0 (H) 05/10/2018 2308   O2SAT 84.9 05/11/2018 0635    CXR: PORTABLE CHEST 1 VIEW  COMPARISON:  May 10, 2018  FINDINGS: A left IJ sheath remains stable. Chest tubes remain in place with no pneumothorax. The cardiomediastinal silhouette is stable with cardiomegaly. Mild bibasilar atelectasis. No overt edema or suspicious infiltrate.  IMPRESSION: 1. Support apparatus as above. 2. Cardiomegaly. 3. Bibasilar opacities, likely atelectasis, stable in the interval.   Electronically Signed   By: Dorise Bullion III M.D   On: 05/11/2018 10:13   Assessment/Plan: S/P Procedure(s) (LRB): TRANSESOPHAGEAL ECHOCARDIOGRAM (TEE) (N/A) CLIPPING OF ATRIAL APPENDAGE USING ATRICLIP  PRO2 CLIP SIZE 45MM (N/A) MAZE (N/A) MITRAL VALVE REPAIR (MVR) USING MEMO 4D RING SIZE 26MM (N/A) TRICUSPID VALVE REPAIR USING MC3 RING SIZE 28MM (N/A)  Overall stable POD2 Maintaining stable rhythm and BP off all pressors Breathing comfortably w/ O2 sats 97-100% on 2 L/min Post-op confusion/agitation, worse at night - currently looks fine but drowsy, no focal neurologic signs Acute on chronic diastolic CHF with expected post-op volume excess, diuresing reasonably well Longstanding persistent atrial fibrillation, maintaining junctional rhythm s/p maze procedure Preop chronic anemia w/ expected post op acute blood loss anemia, Hgb 9.5 stable Expected post op atelectasis, mild CKD stage III, creatinine below preop baseline Type II  diabetes mellitus, excellent glycemic control   Mobilize  Diuresis  D/C mediastinal tubes but leave pleural tubes in until output decreases  Minimize narcotics  Insert PICC for prolonged IV therapy  Coumadin   Diane Alberts, MD 05/11/2018 10:25 AM

## 2018-05-12 LAB — TYPE AND SCREEN
ABO/RH(D): B POS
Antibody Screen: NEGATIVE
Unit division: 0
Unit division: 0
Unit division: 0
Unit division: 0

## 2018-05-12 LAB — CBC
HCT: 27.2 % — ABNORMAL LOW (ref 36.0–46.0)
Hemoglobin: 8.5 g/dL — ABNORMAL LOW (ref 12.0–15.0)
MCH: 26.2 pg (ref 26.0–34.0)
MCHC: 31.3 g/dL (ref 30.0–36.0)
MCV: 83.7 fL (ref 80.0–100.0)
Platelets: 126 10*3/uL — ABNORMAL LOW (ref 150–400)
RBC: 3.25 MIL/uL — ABNORMAL LOW (ref 3.87–5.11)
RDW: 16.7 % — ABNORMAL HIGH (ref 11.5–15.5)
WBC: 11 10*3/uL — ABNORMAL HIGH (ref 4.0–10.5)
nRBC: 0 % (ref 0.0–0.2)

## 2018-05-12 LAB — BPAM RBC
Blood Product Expiration Date: 201912302359
Blood Product Expiration Date: 201912302359
Blood Product Expiration Date: 201912302359
Blood Product Expiration Date: 201912302359
ISSUE DATE / TIME: 201912121553
ISSUE DATE / TIME: 201912121839
Unit Type and Rh: 7300
Unit Type and Rh: 7300
Unit Type and Rh: 7300
Unit Type and Rh: 7300

## 2018-05-12 LAB — PROTIME-INR
INR: 1.45
Prothrombin Time: 17.5 seconds — ABNORMAL HIGH (ref 11.4–15.2)

## 2018-05-12 LAB — BASIC METABOLIC PANEL
Anion gap: 9 (ref 5–15)
Anion gap: 8 (ref 5–15)
BUN: 14 mg/dL (ref 8–23)
BUN: 15 mg/dL (ref 8–23)
CO2: 23 mmol/L (ref 22–32)
CO2: 23 mmol/L (ref 22–32)
Calcium: 8 mg/dL — ABNORMAL LOW (ref 8.9–10.3)
Calcium: 7.8 mg/dL — ABNORMAL LOW (ref 8.9–10.3)
Chloride: 105 mmol/L (ref 98–111)
Chloride: 103 mmol/L (ref 98–111)
Creatinine, Ser: 1.27 mg/dL — ABNORMAL HIGH (ref 0.44–1.00)
Creatinine, Ser: 1.26 mg/dL — ABNORMAL HIGH (ref 0.44–1.00)
GFR calc Af Amer: 48 mL/min — ABNORMAL LOW (ref >60)
GFR calc Af Amer: 49 mL/min — ABNORMAL LOW (ref >60)
GFR calc non Af Amer: 42 mL/min — ABNORMAL LOW (ref >60)
GFR calc non Af Amer: 42 mL/min — ABNORMAL LOW (ref >60)
Glucose, Bld: 98 mg/dL (ref 70–99)
Glucose, Bld: 98 mg/dL (ref 70–99)
Potassium: 4 mmol/L (ref 3.5–5.1)
Potassium: 4.2 mmol/L (ref 3.5–5.1)
Sodium: 137 mmol/L (ref 135–145)
Sodium: 134 mmol/L — ABNORMAL LOW (ref 135–145)

## 2018-05-12 LAB — GLUCOSE, CAPILLARY
Glucose-Capillary: 72 mg/dL (ref 70–99)
Glucose-Capillary: 75 mg/dL (ref 70–99)
Glucose-Capillary: 78 mg/dL (ref 70–99)

## 2018-05-12 MED ORDER — FUROSEMIDE 10 MG/ML IJ SOLN
10.0000 mg/h | INTRAVENOUS | Status: AC
  Administered 2018-05-12 – 2018-05-14 (×3): 10 mg/h via INTRAVENOUS
  Filled 2018-05-12 (×2): qty 21
  Filled 2018-05-12 (×2): qty 25

## 2018-05-12 MED ORDER — LOSARTAN POTASSIUM 50 MG PO TABS
50.0000 mg | ORAL_TABLET | Freq: Every day | ORAL | Status: DC
  Administered 2018-05-12 – 2018-05-13 (×2): 50 mg via ORAL
  Filled 2018-05-12 (×2): qty 1

## 2018-05-12 MED ORDER — FE FUMARATE-B12-VIT C-FA-IFC PO CAPS
1.0000 | ORAL_CAPSULE | Freq: Three times a day (TID) | ORAL | Status: DC
  Administered 2018-05-12 – 2018-05-20 (×25): 1 via ORAL
  Filled 2018-05-12 (×25): qty 1

## 2018-05-12 MED ORDER — SPIRONOLACTONE 25 MG PO TABS
25.0000 mg | ORAL_TABLET | Freq: Every day | ORAL | Status: DC
  Administered 2018-05-12 – 2018-05-20 (×9): 25 mg via ORAL
  Filled 2018-05-12 (×9): qty 1

## 2018-05-12 NOTE — Progress Notes (Signed)
Subjective:  Doing well denies any chest pain or shortness of breath.  No further episodes of confusion or delirium.  Objective:  Vital Signs in the last 24 hours: Temp:  [97.4 F (36.3 C)-98.7 F (37.1 C)] 97.4 F (36.3 C) (12/15 0753) Pulse Rate:  [56-70] 56 (12/15 0900) Resp:  [11-26] 19 (12/15 0900) BP: (84-170)/(47-87) 152/80 (12/15 0900) SpO2:  [90 %-100 %] 98 % (12/15 0900) Weight:  [91.1 kg] 91.1 kg (12/15 0600)  Intake/Output from previous day: 12/14 0701 - 12/15 0700 In: 836.2 [P.O.:600; I.V.:136.1; IV Piggyback:100.1] Out: 2725 [Urine:1835; Chest Tube:890] Intake/Output from this shift: Total I/O In: 0.4 [I.V.:0.4] Out: 680 [Urine:500; Chest Tube:180]  Physical Exam: Neck: no adenopathy, no carotid bruit, no JVD and supple, symmetrical, trachea midline Lungs: Decreased breath sound at bases with basilar rales noted Heart: regular rate and rhythm and S1, S2 normal Abdomen: soft, non-tender; bowel sounds normal; no masses,  no organomegaly Extremities: extremities normal, atraumatic, no cyanosis or edema  Lab Results: Recent Labs    05/11/18 0411 05/12/18 0347  WBC 11.7* 11.0*  HGB 9.5* 8.5*  PLT 115* 126*   Recent Labs    05/11/18 0315 05/12/18 0347  NA 137 137  K 4.3 4.0  CL 110 105  CO2 20* 23  GLUCOSE 126* 98  BUN 10 14  CREATININE 1.15* 1.27*   No results for input(s): TROPONINI in the last 72 hours.  Invalid input(s): CK, MB Hepatic Function Panel No results for input(s): PROT, ALBUMIN, AST, ALT, ALKPHOS, BILITOT, BILIDIR, IBILI in the last 72 hours. No results for input(s): CHOL in the last 72 hours. No results for input(s): PROTIME in the last 72 hours.  Imaging: Imaging results have been reviewed and Dg Chest Port 1 View  Result Date: 05/11/2018 CLINICAL DATA:  PICC line placement EXAM: PORTABLE CHEST 1 VIEW COMPARISON:  05/11/2018 FINDINGS: Cardiomegaly. Prior valve replacement. Left PICC line remains in place with the tip at the  cavoatrial junction. Bilateral chest tubes. No pneumothorax. Mild vascular congestion and bibasilar atelectasis. IMPRESSION: Postoperative changes.  Bilateral chest tubes.  No pneumothorax. Bibasilar atelectasis. Electronically Signed   By: Rolm Baptise M.D.   On: 05/11/2018 22:45   Dg Chest Port 1 View  Result Date: 05/11/2018 CLINICAL DATA:  PICC placement EXAM: PORTABLE CHEST 1 VIEW COMPARISON:  05/11/2018 chest radiograph at 7:50 a.m. FINDINGS: Tip of the left approach PICC line projects over the proximal right atrium. Cardiomegaly is unchanged. Left IJ sheath, bilateral chest tubes are unchanged. There is bibasilar atelectasis. Mediastinal drain has been removed. IMPRESSION: Tip of the left approach PICC line projects over the proximal right atrium. Electronically Signed   By: Ulyses Jarred M.D.   On: 05/11/2018 19:54   Dg Chest Port 1 View  Result Date: 05/11/2018 CLINICAL DATA:  Atelectasis. EXAM: PORTABLE CHEST 1 VIEW COMPARISON:  May 10, 2018 FINDINGS: A left IJ sheath remains stable. Chest tubes remain in place with no pneumothorax. The cardiomediastinal silhouette is stable with cardiomegaly. Mild bibasilar atelectasis. No overt edema or suspicious infiltrate. IMPRESSION: 1. Support apparatus as above. 2. Cardiomegaly. 3. Bibasilar opacities, likely atelectasis, stable in the interval. Electronically Signed   By: Dorise Bullion III M.D   On: 05/11/2018 10:13   Dg Chest Port 1 View  Result Date: 05/10/2018 CLINICAL DATA:  Postop EXAM: PORTABLE CHEST 1 VIEW COMPARISON:  05/10/2018, 05/08/2018, 05/03/2018 FINDINGS: Post sternotomy changes. Mediastinal and chest drainage catheters are similar in position. Cardiomegaly with vascular congestion and mild pulmonary  edema. Small pleural effusions. Bibasilar airspace disease without interval change. No pneumothorax. Removal of left IJ Swan-Ganz catheter. IMPRESSION: 1. Removal of left IJ Swan-Ganz catheter 2. Similar positioning of chest and  mediastinal drainage catheters. 3. No significant change in moderate cardiomegaly, vascular congestion and mild edema. Persistent small pleural effusions and bibasilar airspace disease without significant change. Electronically Signed   By: Donavan Foil M.D.   On: 05/10/2018 23:19   [place] Ekg Site Rite  Result Date: 05/11/2018 If Site Rite image not attached, placement could not be confirmed due to current cardiac rhythm.   Cardiac Studies:  Assessment/Plan:  Severe symptomatic mitral regurgitation/tricuspid regurgitation status post MVR/TVR and annuloplasty/Maze procedure and left atrial appendage clippingpostop day 3, doing well Mild volume overload Pulmonary hypertension. 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 Hypertension. Diabetes mellitus. Hyperlipidemia. Osteoarthritis Status post acute CCU psychosis Plan Continue present management Encouraged incentive spirometry Up in chair as tolerated  LOS: 9 days    [name] 05/12/2018, 10:51 AM

## 2018-05-12 NOTE — Progress Notes (Signed)
[address]       [place] [address]             [phone]        CARDIOTHORACIC SURGERY PROGRESS NOTE   R3 Days Post-Op Procedure(s) (LRB): TRANSESOPHAGEAL ECHOCARDIOGRAM (TEE) (N/A) CLIPPING OF ATRIAL APPENDAGE USING ATRICLIP PRO2 CLIP SIZE 45MM (N/A) MAZE (N/A) MITRAL VALVE REPAIR (MVR) USING MEMO 4D RING SIZE 26MM (N/A) TRICUSPID VALVE REPAIR USING MC3 RING SIZE 28MM (N/A)  Subjective: Looks good.  Had a quiet night with no further episodes confusion/delirium.  Denies pain or SOB.  Marginal appetite.  Objective: Vital signs: BP Readings from Last 1 Encounters:  05/12/18 (!) 150/72   Pulse Readings from Last 1 Encounters:  05/12/18 68   Resp Readings from Last 1 Encounters:  05/12/18 20   Temp Readings from Last 1 Encounters:  05/12/18 (!) 97.4 F (36.3 C) (Axillary)    Hemodynamics:    Physical Exam:  Rhythm:   Junctional 55-60  Breath sounds: Diminished at bases  Heart sounds:  RRR w/out murmur  Incisions:  Dressing dry, intact  Abdomen:  Soft, non-distended, non-tender  Extremities:  Warm, well-perfused  Chest tubes:  Decreasing but significant volume thin serosanguinous output, no air leak    Intake/Output from previous day: 12/14 0701 - 12/15 0700 In: 836.2 [P.O.:600; I.V.:136.1; IV Piggyback:100.1] Out: 2725 [Urine:1835; Chest Tube:890] Intake/Output this shift: Total I/O In: -  Out: 150 [Urine:50; Chest Tube:100]  Lab Results:  CBC: Recent Labs    05/11/18 0411 05/12/18 0347  WBC 11.7* 11.0*  HGB 9.5* 8.5*  HCT 30.3* 27.2*  PLT 115* 126*    BMET:  Recent Labs    05/11/18 0315 05/12/18 0347  NA 137 137  K 4.3 4.0  CL 110 105  CO2 20* 23  GLUCOSE 126* 98  BUN 10 14  CREATININE 1.15* 1.27*  CALCIUM 8.0* 8.0*     PT/INR:   Recent Labs    05/12/18 0347  LABPROT 17.5*  INR 1.45    CBG (last 3)  Recent Labs    05/11/18 1640 05/11/18 2047 05/12/18 0732  GLUCAP 125* 87 72    ABG      Component Value Date/Time   PHART 7.367 05/10/2018 2308   PCO2ART 30.7 (L) 05/10/2018 2308   PO2ART 59.0 (L) 05/10/2018 2308   HCO3 17.7 (L) 05/10/2018 2308   TCO2 19 (L) 05/10/2018 2308   ACIDBASEDEF 7.0 (H) 05/10/2018 2308   O2SAT 84.9 05/11/2018 0635    CXR: Not done  Assessment/Plan: S/P Procedure(s) (LRB): TRANSESOPHAGEAL ECHOCARDIOGRAM (TEE) (N/A) CLIPPING OF ATRIAL APPENDAGE USING ATRICLIP PRO2 CLIP SIZE 45MM (N/A) MAZE (N/A) MITRAL VALVE REPAIR (MVR) USING MEMO 4D RING SIZE 26MM (N/A) TRICUSPID VALVE REPAIR USING MC3 RING SIZE 28MM (N/A)  Overall stable POD3 Maintaining stable rhythm and BP off all pressors, BP trending up Breathing comfortably w/ O2 sats 97-100% on 2 L/min Acute on chronic diastolic CHF with expected post-op volume excess,I/O's 1.9 L negative yesterday but weight up and still 4 kg > preop Longstanding persistent atrial fibrillation, maintaining junctional rhythm s/p maze procedure Preop chronic anemia w/ expected post op acute blood loss anemia,Hgb down slightly 8.5 Expected post op atelectasis,mild CKD stage III, creatinine stable below preop baseline Type II diabetes mellitus, excellent glycemic control   Mobilize  Diuresis - will start lasix drip and add Spironolactone  Continue pacer VVI backup - no beta blockers  Restart ARB at reduced dose  Leave  pleural tubes in until output decreases  Minimize narcotics  PT consult   Coumadin  Rexene Alberts, MD 05/12/2018 8:53 AM

## 2018-05-13 ENCOUNTER — Inpatient Hospital Stay (HOSPITAL_COMMUNITY): Payer: Medicare Other

## 2018-05-13 LAB — COOXEMETRY PANEL
Carboxyhemoglobin: 1.7 % — ABNORMAL HIGH (ref 0.5–1.5)
Carboxyhemoglobin: 1.9 % — ABNORMAL HIGH (ref 0.5–1.5)
Methemoglobin: 1.2 % (ref 0.0–1.5)
Methemoglobin: 1.1 % (ref 0.0–1.5)
O2 Saturation: 48.3 %
O2 Saturation: 45.2 %
Total hemoglobin: 10.4 g/dL — ABNORMAL LOW (ref 12.0–16.0)
Total hemoglobin: 8.6 g/dL — ABNORMAL LOW (ref 12.0–16.0)

## 2018-05-13 LAB — BRAIN NATRIURETIC PEPTIDE: B Natriuretic Peptide: 1276.6 pg/mL — ABNORMAL HIGH (ref 0.0–100.0)

## 2018-05-13 LAB — COMPREHENSIVE METABOLIC PANEL
ALT: 19 U/L (ref 0–44)
AST: 21 U/L (ref 15–41)
Albumin: 2.3 g/dL — ABNORMAL LOW (ref 3.5–5.0)
Alkaline Phosphatase: 51 U/L (ref 38–126)
Anion gap: 10 (ref 5–15)
BUN: 16 mg/dL (ref 8–23)
CO2: 25 mmol/L (ref 22–32)
Calcium: 7.9 mg/dL — ABNORMAL LOW (ref 8.9–10.3)
Chloride: 101 mmol/L (ref 98–111)
Creatinine, Ser: 1.32 mg/dL — ABNORMAL HIGH (ref 0.44–1.00)
GFR calc Af Amer: 46 mL/min — ABNORMAL LOW (ref >60)
GFR calc non Af Amer: 40 mL/min — ABNORMAL LOW (ref >60)
Glucose, Bld: 91 mg/dL (ref 70–99)
Potassium: 3.3 mmol/L — ABNORMAL LOW (ref 3.5–5.1)
Sodium: 136 mmol/L (ref 135–145)
Total Bilirubin: 0.7 mg/dL (ref 0.3–1.2)
Total Protein: 5.5 g/dL — ABNORMAL LOW (ref 6.5–8.1)

## 2018-05-13 LAB — CBC
HCT: 27.5 % — ABNORMAL LOW (ref 36.0–46.0)
Hemoglobin: 8.7 g/dL — ABNORMAL LOW (ref 12.0–15.0)
MCH: 26.4 pg (ref 26.0–34.0)
MCHC: 31.6 g/dL (ref 30.0–36.0)
MCV: 83.3 fL (ref 80.0–100.0)
Platelets: 150 10*3/uL (ref 150–400)
RBC: 3.3 MIL/uL — ABNORMAL LOW (ref 3.87–5.11)
RDW: 16.5 % — ABNORMAL HIGH (ref 11.5–15.5)
WBC: 11.6 10*3/uL — ABNORMAL HIGH (ref 4.0–10.5)
nRBC: 0.2 % (ref 0.0–0.2)

## 2018-05-13 LAB — PROTIME-INR
INR: 1.41
Prothrombin Time: 17.1 seconds — ABNORMAL HIGH (ref 11.4–15.2)

## 2018-05-13 LAB — POTASSIUM: Potassium: 5.5 mmol/L — ABNORMAL HIGH (ref 3.5–5.1)

## 2018-05-13 LAB — GLUCOSE, CAPILLARY
Glucose-Capillary: 121 mg/dL — ABNORMAL HIGH (ref 70–99)
Glucose-Capillary: 76 mg/dL (ref 70–99)
Glucose-Capillary: 65 mg/dL — ABNORMAL LOW (ref 70–99)
Glucose-Capillary: 101 mg/dL — ABNORMAL HIGH (ref 70–99)
Glucose-Capillary: 123 mg/dL — ABNORMAL HIGH (ref 70–99)
Glucose-Capillary: 132 mg/dL — ABNORMAL HIGH (ref 70–99)
Glucose-Capillary: 104 mg/dL — ABNORMAL HIGH (ref 70–99)

## 2018-05-13 LAB — MRSA PCR SCREENING: MRSA by PCR: NEGATIVE

## 2018-05-13 MED ORDER — MILRINONE LACTATE IN DEXTROSE 20-5 MG/100ML-% IV SOLN
0.1000 ug/kg/min | INTRAVENOUS | Status: DC
  Administered 2018-05-13: 0.25 ug/kg/min via INTRAVENOUS
  Administered 2018-05-13 – 2018-05-16 (×5): 0.3 ug/kg/min via INTRAVENOUS
  Administered 2018-05-17: 0.2 ug/kg/min via INTRAVENOUS
  Filled 2018-05-13 (×7): qty 100

## 2018-05-13 MED ORDER — POTASSIUM CHLORIDE 10 MEQ/50ML IV SOLN
10.0000 meq | INTRAVENOUS | Status: AC
  Administered 2018-05-13 (×6): 10 meq via INTRAVENOUS
  Filled 2018-05-13 (×6): qty 50

## 2018-05-13 MED ORDER — DOPAMINE-DEXTROSE 3.2-5 MG/ML-% IV SOLN: 3.0000 ug/kg/min | INTRAVENOUS | Status: DC

## 2018-05-13 NOTE — Progress Notes (Signed)
PT Cancellation Note  Patient Details Name: Diane [name] MRN: [id number] DOB: 1940/11/05   Cancelled Treatment:    Reason Eval/Treat Not Completed: Patient at procedure or test/unavailable(attempted to see pt however, IV team arrive to place PICC) will follow as time permits    Duncan Dull 05/13/2018, 11:37 AM Alben Deeds, PT DPT  Board Certified Neurologic Specialist [place] Pager [phone] Office [phone]

## 2018-05-13 NOTE — Progress Notes (Signed)
[address]       [place] [address]             [phone]        CARDIOTHORACIC SURGERY PROGRESS NOTE   R4 Days Post-Op Procedure(s) (LRB): TRANSESOPHAGEAL ECHOCARDIOGRAM (TEE) (N/A) CLIPPING OF ATRIAL APPENDAGE USING ATRICLIP PRO2 CLIP SIZE 45MM (N/A) MAZE (N/A) MITRAL VALVE REPAIR (MVR) USING MEMO 4D RING SIZE 26MM (N/A) TRICUSPID VALVE REPAIR USING MC3 RING SIZE 28MM (N/A)  Subjective: Looks very good and reports feeling well.  Slept well.  No SOB.  No pain.  Eating breakfast.  Getting stronger  Objective: Vital signs: BP Readings from Last 1 Encounters:  05/13/18 (!) 138/53   Pulse Readings from Last 1 Encounters:  05/13/18 (!) 57   Resp Readings from Last 1 Encounters:  05/13/18 (!) 23   Temp Readings from Last 1 Encounters:  05/13/18 98.1 F (36.7 C) (Oral)    Hemodynamics:    Physical Exam:  Rhythm:   Junctional 50's  Breath sounds: clear  Heart sounds:  RRR w/out murmur  Incisions:  Clean and dry  Abdomen:  Soft, non-distended, non-tender  Extremities:  Warm, well-perfused  Chest tubes:  Decreasing but significant volume thin serosanguinous output, no air leak    Intake/Output from previous day: 12/15 0701 - 12/16 0700 In: 915.7 [P.O.:720; I.V.:195.7] Out: 6433 [Urine:3400; Chest Tube:615] Intake/Output this shift: No intake/output data recorded.  Lab Results:  CBC: Recent Labs    05/12/18 0347 05/13/18 0230  WBC 11.0* 11.6*  HGB 8.5* 8.7*  HCT 27.2* 27.5*  PLT 126* 150    BMET:  Recent Labs    05/12/18 1718 05/13/18 0230  NA 134* 136  K 4.2 3.3*  CL 103 101  CO2 23 25  GLUCOSE 98 91  BUN 15 16  CREATININE 1.26* 1.32*  CALCIUM 7.8* 7.9*     PT/INR:   Recent Labs    05/13/18 0230  LABPROT 17.1*  INR 1.41    CBG (last 3)  Recent Labs    05/12/18 2129 05/13/18 0707 05/13/18 0800  GLUCAP 76 65* 101*    ABG    Component Value Date/Time   PHART 7.367 05/10/2018 2308   PCO2ART 30.7 (L)  05/10/2018 2308   PO2ART 59.0 (L) 05/10/2018 2308   HCO3 17.7 (L) 05/10/2018 2308   TCO2 19 (L) 05/10/2018 2308   ACIDBASEDEF 7.0 (H) 05/10/2018 2308   O2SAT 84.9 05/11/2018 0635    CXR: PORTABLE CHEST 1 VIEW  COMPARISON:  Portable chest x-ray of May 11, 2018  FINDINGS: The lungs are reasonably well inflated. The interstitial markings have improved. There remains increased interstitial density at the lung bases. There is no pneumothorax. The bilateral chest tubes are in stable position. The cardiac silhouette remains enlarged. The central pulmonary vascularity remains prominent. The prosthetic valve ring and the left atrial appendage clip are in stable position. The left-sided PICC line tip projects over the distal third of the SVC.  IMPRESSION: Slight interval improvement in the appearance of the pulmonary interstitium. No pneumothorax nor large pleural effusion. Stable enlargement of cardiac silhouette with mild pulmonary vascular congestion. The chest tubes are in stable position as is the PICC line.   Electronically Signed   By: David  Martinique M.D.   On: 05/13/2018 07:13  Assessment/Plan: S/P Procedure(s) (LRB): TRANSESOPHAGEAL ECHOCARDIOGRAM (TEE) (N/A) CLIPPING OF ATRIAL APPENDAGE USING ATRICLIP PRO2 CLIP SIZE 45MM (N/A) MAZE (N/A) MITRAL VALVE REPAIR (MVR) USING MEMO 4D RING  SIZE 26MM (N/A) TRICUSPID VALVE REPAIR USING MC3 RING SIZE 28MM (N/A)  Overall stable/improving POD4 Maintaining stable rhythm and BP  Breathing comfortably w/ O2 sats 97-100% on 2 L/min, O2 sats still drop <90% off O2 Acute on chronic diastolic CHF with expected post-op volume excess,I/O's 3.1 L negative yesterday and weight down still > preop Longstanding persistent atrial fibrillation, maintaining junctional rhythm s/p maze procedure Preop chronic anemia w/ expected post op acute blood loss anemia,Hgbstable 8.7 Expected post op atelectasis,mild CKD stage III, creatinine  stable and diuresing well Type II diabetes mellitus,excellentglycemic control although some hypoglycemia   Check co-ox  Mobilize  Diuresis - continue lasix drip and Spironolactone for now  Bradycardia - no beta blockers   Continue Cozaar at reduced dose for now  Leave pleural tubes in until output decreases  Minimize narcotics  PT consult   Coumadin  Transfer 4E if co-ox okay  Rexene Alberts, MD 05/13/2018 8:11 AM

## 2018-05-13 NOTE — Evaluation (Signed)
Physical Therapy Evaluation Patient Details Name: Diane [name] MRN: [id number] DOB: August 14, 1940 Today's Date: 05/13/2018   History of Present Illness  Patient is a 77 yo female status post MVR/TVR and annuloplasty/Maze procedure and left atrial appendage clipping. History of severe symptomatic mitral regurgitation. Pulmonary hypertension. 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 Patient demonstrates deficits in functional mobility as indicated below. Will benefit from continued skilled PT to address deficits and maximize function. Will see as indicated and progress as tolerated.  At this time, patient mobilizing fairly well, required +2 assist for line management only. VSS with activity on room air. Anticipate patient will progress well and recommend d/c home with assist, if unable to arrange family assist may need to consider ST SNF.    Follow Up Recommendations Home health PT;Supervision/Assistance - 24 hour    Equipment Recommendations  (n)    Recommendations for Other Services       Precautions / Restrictions Precautions Precautions: Sternal Restrictions Weight Bearing Restrictions: Yes(sternal) RUE Weight Bearing: (R mastectomy, no sticks pricks BP)      Mobility  Bed Mobility Overal bed mobility: Needs Assistance Bed Mobility: Supine to Sit;Sit to Supine     Supine to sit: Min assist Sit to supine: Min assist   General bed mobility comments: min assist for safety with mobility, increased time and effort to perform. 2 person assisting with line management  Transfers Overall transfer level: Needs assistance Equipment used: Ambulation equipment used Transfers: Sit to/from Stand Sit to Stand: +2 safety/equipment;Min guard         General transfer comment: Min guard for safety to power up to standing  Ambulation/Gait Ambulation/Gait assistance: Min  guard Gait Distance (Feet): 50 Feet Assistive device: (eva walker) Gait Pattern/deviations: Step-through pattern;Decreased stride length;Trunk flexed Gait velocity: decreased   General Gait Details: reliance on UE support in standing, increased effort noted. VSS with activity  Stairs            Wheelchair Mobility    Modified Rankin (Stroke Patients Only)       Balance Overall balance assessment: Mild deficits observed, not formally tested                                           Pertinent Vitals/Pain Pain Assessment: No/denies pain    Home Living Family/patient expects to be discharged to:: Private residence Living Arrangements: Alone Available Help at Discharge: Family;Available PRN/intermittently Type of Home: Apartment Home Access: Level entry     Home Layout: One level Home Equipment: Walker - 2 wheels;Walker - 4 wheels;Cane - single point;Bedside commode;Toilet riser;Shower seat      Prior Function Level of Independence: Independent;Independent with assistive device(s)         Comments: use of cane vs RW pending on activity     Hand Dominance   Dominant Hand: Right    Extremity/Trunk Assessment   Upper Extremity Assessment Upper Extremity Assessment: Generalized weakness    Lower Extremity Assessment Lower Extremity Assessment: Generalized weakness    Cervical / Trunk Assessment Cervical / Trunk Assessment: (increased body habitus)  Communication   Communication: No difficulties  Cognition Arousal/Alertness: Awake/alert Behavior During Therapy: WFL for tasks assessed/performed Overall Cognitive Status: Within Functional Limits for tasks assessed  General Comments      Exercises     Assessment/Plan    PT Assessment Patient needs continued PT services  PT Problem List Decreased strength;Decreased range of motion;Decreased activity tolerance;Decreased  balance;Decreased mobility;Decreased safety awareness;Decreased knowledge of precautions;Cardiopulmonary status limiting activity;Obesity       PT Treatment Interventions DME instruction;Gait training;Functional mobility training;Therapeutic activities;Therapeutic exercise;Balance training;Patient/family education    PT Goals (Current goals can be found in the Care Plan section)  Acute Rehab PT Goals Patient Stated Goal: to go home PT Goal Formulation: With patient Time For Goal Achievement: 05/27/18 Potential to Achieve Goals: Good    Frequency Min 3X/week   Barriers to discharge Decreased caregiver support      Co-evaluation               AM-PAC PT "6 Clicks" Mobility  Outcome Measure Help needed turning from your back to your side while in a flat bed without using bedrails?: A Little Help needed moving from lying on your back to sitting on the side of a flat bed without using bedrails?: A Little Help needed moving to and from a bed to a chair (including a wheelchair)?: A Little Help needed standing up from a chair using your arms (e.g., wheelchair or bedside chair)?: A Little Help needed to walk in hospital room?: A Little Help needed climbing 3-5 steps with a railing? : A Lot 6 Click Score: 17    End of Session   Activity Tolerance: Patient tolerated treatment well;Patient limited by fatigue Patient left: in bed;with call bell/phone within reach Nurse Communication: Mobility status PT Visit Diagnosis: Difficulty in walking, not elsewhere classified (R26.2)    Time: 9211-9417 PT Time Calculation (min) (ACUTE ONLY): 22 min   Charges:   PT Evaluation $PT Eval Moderate Complexity: 1 Mod          Alben Deeds, PT DPT  Board Certified Neurologic Specialist Acute Rehabilitation Services Pager [phone] Office [phone]   Duncan Dull 05/13/2018, 3:51 PM

## 2018-05-13 NOTE — Progress Notes (Signed)
EVENING ROUNDS NOTE :     [address]       [place] [address]             [phone]                 4 Days Post-Op Procedure(s) (LRB): TRANSESOPHAGEAL ECHOCARDIOGRAM (TEE) (N/A) CLIPPING OF ATRIAL APPENDAGE USING ATRICLIP PRO2 CLIP SIZE 45MM (N/A) MAZE (N/A) MITRAL VALVE REPAIR (MVR) USING MEMO 4D RING SIZE 26MM (N/A) TRICUSPID VALVE REPAIR USING MC3 RING SIZE 28MM (N/A)  Total Length of Stay:  LOS: 4 days  BP 106/71   Pulse 65   Temp 97.7 F (36.5 C) (Oral)   Resp (!) 23   Ht 5\' 7"  (1.702 m)   Wt 87.5 kg   SpO2 92%   BMI 30.21 kg/m   .Intake/Output      12/15 0701 - 12/16 0700 12/16 0701 - 12/17 0700   P.O. 720    I.V. (mL/kg) 204.4 (2.3) 74.5 (0.9)   IV Piggyback 19.7 105.9   Total Intake(mL/kg) 944.1 (10.8) 180.4 (2.1)   Urine (mL/kg/hr) 3400 (1.6) 650 (0.7)   Stool  0   Chest Tube 615 90   Total Output 4015 740   Net -3070.9 -559.6        Stool Occurrence  1 x     . sodium chloride Stopped (05/13/18 1431)  . furosemide (LASIX) infusion 10 mg/hr (05/13/18 1500)  . milrinone 0.25 mcg/kg/min (05/13/18 1500)  . potassium chloride 10 mEq (05/13/18 1712)     Lab Results  Component Value Date   WBC 11.6 (H) 05/13/2018   HGB 8.7 (L) 05/13/2018   HCT 27.5 (L) 05/13/2018   PLT 150 05/13/2018   GLUCOSE 91 05/13/2018   CHOL 107 05/03/2018   TRIG 22 05/03/2018   HDL 46 05/03/2018   LDLCALC 57 05/03/2018   ALT 19 05/13/2018   AST 21 05/13/2018   NA 136 05/13/2018   K 3.3 (L) 05/13/2018   CL 101 05/13/2018   CREATININE 1.32 (H) 05/13/2018   BUN 16 05/13/2018   CO2 25 05/13/2018   TSH 1.495 06/20/2017   INR 1.41 05/13/2018   HGBA1C 5.9 (H) 05/03/2018   MICROALBUR 0.50 11/04/2009   On lasix drip- 800 ml past 10 hours Cox low this am, back on milrinone now pic exchange done    Grace Isaac MD  Beeper [phone] Office [phone] 05/13/2018 5:24 PM

## 2018-05-13 NOTE — Progress Notes (Signed)
Patients orientation much better last night than prior.  She is following commands and is oriented.  Chest tube output is still moderate through night.  Vitals remained stable.

## 2018-05-13 NOTE — Progress Notes (Signed)
Subjective:  Denies chest pain states breathing has improved.  Seen earlier today had exchange of PICC line.  Objective:  Vital Signs in the last 24 hours: Temp:  [97.8 F (36.6 C)-98.1 F (36.7 C)] 97.8 F (36.6 C) (12/16 1119) Pulse Rate:  [48-73] 56 (12/16 1100) Resp:  [12-23] 21 (12/16 1100) BP: (82-180)/(42-100) 141/56 (12/16 1100) SpO2:  [87 %-100 %] 96 % (12/16 1100) Weight:  [87.5 kg] 87.5 kg (12/16 0229)  Intake/Output from previous day: 12/15 0701 - 12/16 0700 In: 944.1 [P.O.:720; I.V.:204.4; IV Piggyback:19.7] Out: 2585 [Urine:3400; Chest Tube:615] Intake/Output from this shift: Total I/O In: 75.7 [I.V.:39.7; IV Piggyback:36] Out: -   Physical Exam: Neck: no adenopathy, no carotid bruit, no JVD and supple, symmetrical, trachea midline Lungs: Decreased breath sound at bases with bilateral rales noted Heart: regular rate and rhythm and S1, S2 normal Abdomen: soft, non-tender; bowel sounds normal; no masses,  no organomegaly Extremities: extremities normal, atraumatic, no cyanosis or edema  Lab Results: Recent Labs    05/12/18 0347 05/13/18 0230  WBC 11.0* 11.6*  HGB 8.5* 8.7*  PLT 126* 150   Recent Labs    05/12/18 1718 05/13/18 0230  NA 134* 136  K 4.2 3.3*  CL 103 101  CO2 23 25  GLUCOSE 98 91  BUN 15 16  CREATININE 1.26* 1.32*   No results for input(s): TROPONINI in the last 72 hours.  Invalid input(s): CK, MB Hepatic Function Panel Recent Labs    05/13/18 0230  PROT 5.5*  ALBUMIN 2.3*  AST 21  ALT 19  ALKPHOS 51  BILITOT 0.7   No results for input(s): CHOL in the last 72 hours. No results for input(s): PROTIME in the last 72 hours.  Imaging: Imaging results have been reviewed and Dg Chest Port 1 View  Result Date: 05/13/2018 CLINICAL DATA:  PICC placement. Status post mitral valve repair and tricuspid valve repair and clipping of the left atrial appendage. EXAM: PORTABLE CHEST 1 VIEW 12:09 p.m. COMPARISON:  05/13/2018 at 5:39 a.m.  FINDINGS: Left-sided PICC is again noted. PICC tip is in the right atrium 6.4 cm below the carina. Slight atelectasis at the lung bases, essentially unchanged. Bilateral chest tubes are unchanged. No pneumothorax. IMPRESSION: PICC tip is in the right atrium 6.4 cm below the carina. No other change. Electronically Signed   By: Lorriane Shire M.D.   On: 05/13/2018 12:19   Dg Chest Port 1 View  Result Date: 05/13/2018 CLINICAL DATA:  Status post cardiac surgery.  Assess support tubes. EXAM: PORTABLE CHEST 1 VIEW COMPARISON:  Portable chest x-ray of May 11, 2018 FINDINGS: The lungs are reasonably well inflated. The interstitial markings have improved. There remains increased interstitial density at the lung bases. There is no pneumothorax. The bilateral chest tubes are in stable position. The cardiac silhouette remains enlarged. The central pulmonary vascularity remains prominent. The prosthetic valve ring and the left atrial appendage clip are in stable position. The left-sided PICC line tip projects over the distal third of the SVC. IMPRESSION: Slight interval improvement in the appearance of the pulmonary interstitium. No pneumothorax nor large pleural effusion. Stable enlargement of cardiac silhouette with mild pulmonary vascular congestion. The chest tubes are in stable position as is the PICC line. Electronically Signed   By: David  Martinique M.D.   On: 05/13/2018 07:13   Dg Chest Port 1 View  Result Date: 05/11/2018 CLINICAL DATA:  PICC line placement EXAM: PORTABLE CHEST 1 VIEW COMPARISON:  05/11/2018 FINDINGS: Cardiomegaly. Prior  valve replacement. Left PICC line remains in place with the tip at the cavoatrial junction. Bilateral chest tubes. No pneumothorax. Mild vascular congestion and bibasilar atelectasis. IMPRESSION: Postoperative changes.  Bilateral chest tubes.  No pneumothorax. Bibasilar atelectasis. Electronically Signed   By: Rolm Baptise M.D.   On: 05/11/2018 22:45   Dg Chest Port 1  View  Result Date: 05/11/2018 CLINICAL DATA:  PICC placement EXAM: PORTABLE CHEST 1 VIEW COMPARISON:  05/11/2018 chest radiograph at 7:50 a.m. FINDINGS: Tip of the left approach PICC line projects over the proximal right atrium. Cardiomegaly is unchanged. Left IJ sheath, bilateral chest tubes are unchanged. There is bibasilar atelectasis. Mediastinal drain has been removed. IMPRESSION: Tip of the left approach PICC line projects over the proximal right atrium. Electronically Signed   By: Ulyses Jarred M.D.   On: 05/11/2018 19:54    Cardiac Studies:  Assessment/Plan:  Severe symptomatic mitral regurgitation/tricuspid regurgitation status post MVR/TVR and annuloplasty/Maze procedure and left atrial appendage clippingpostop day4, doing well Mild volume overload Pulmonary hypertension. 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 Hypertension. Diabetes mellitus. Hyperlipidemia. Osteoarthritis Status post acute CCU psychosis Plan Continue present management per CVTS Monitor renal function Ambulate as tolerated  LOS: 4 days    [name] 05/13/2018, 11:51 AM

## 2018-05-14 LAB — COOXEMETRY PANEL
Carboxyhemoglobin: 2.2 % — ABNORMAL HIGH (ref 0.5–1.5)
Methemoglobin: 1.6 % — ABNORMAL HIGH (ref 0.0–1.5)
O2 Saturation: 70.9 %
Total hemoglobin: 8.2 g/dL — ABNORMAL LOW (ref 12.0–16.0)

## 2018-05-14 LAB — BASIC METABOLIC PANEL
Anion gap: 10 (ref 5–15)
BUN: 15 mg/dL (ref 8–23)
CO2: 27 mmol/L (ref 22–32)
Calcium: 7.5 mg/dL — ABNORMAL LOW (ref 8.9–10.3)
Chloride: 99 mmol/L (ref 98–111)
Creatinine, Ser: 1.25 mg/dL — ABNORMAL HIGH (ref 0.44–1.00)
GFR calc Af Amer: 49 mL/min — ABNORMAL LOW (ref >60)
GFR calc non Af Amer: 42 mL/min — ABNORMAL LOW (ref >60)
Glucose, Bld: 131 mg/dL — ABNORMAL HIGH (ref 70–99)
Potassium: 3.4 mmol/L — ABNORMAL LOW (ref 3.5–5.1)
Sodium: 136 mmol/L (ref 135–145)

## 2018-05-14 LAB — GLUCOSE, CAPILLARY
Glucose-Capillary: 106 mg/dL — ABNORMAL HIGH (ref 70–99)
Glucose-Capillary: 91 mg/dL (ref 70–99)
Glucose-Capillary: 115 mg/dL — ABNORMAL HIGH (ref 70–99)
Glucose-Capillary: 113 mg/dL — ABNORMAL HIGH (ref 70–99)

## 2018-05-14 LAB — PROTIME-INR
INR: 1.58
Prothrombin Time: 18.6 seconds — ABNORMAL HIGH (ref 11.4–15.2)

## 2018-05-14 MED ORDER — POTASSIUM CHLORIDE 20 MEQ PO PACK
40.0000 meq | PACK | Freq: Two times a day (BID) | ORAL | Status: AC
  Administered 2018-05-14 (×2): 40 meq via ORAL
  Filled 2018-05-14 (×2): qty 2

## 2018-05-14 MED ORDER — FUROSEMIDE 40 MG PO TABS: 40.0000 mg | ORAL_TABLET | Freq: Every day | ORAL | Status: DC

## 2018-05-14 MED ORDER — LOSARTAN POTASSIUM 25 MG PO TABS
25.0000 mg | ORAL_TABLET | Freq: Every day | ORAL | Status: DC
  Filled 2018-05-14: qty 1

## 2018-05-14 MED FILL — Sodium Bicarbonate IV Soln 8.4%: INTRAVENOUS | Qty: 50 | Status: AC

## 2018-05-14 MED FILL — Lidocaine HCl(Cardiac) IV PF Soln Pref Syr 100 MG/5ML (2%): INTRAVENOUS | Qty: 25 | Status: AC

## 2018-05-14 MED FILL — Heparin Sodium (Porcine) Inj 1000 Unit/ML: INTRAMUSCULAR | Qty: 20 | Status: AC

## 2018-05-14 MED FILL — Electrolyte-R (PH 7.4) Solution: INTRAVENOUS | Qty: 3000 | Status: AC

## 2018-05-14 MED FILL — Mannitol IV Soln 20%: INTRAVENOUS | Qty: 1000 | Status: AC

## 2018-05-14 MED FILL — Sodium Chloride IV Soln 0.9%: INTRAVENOUS | Qty: 2000 | Status: AC

## 2018-05-14 NOTE — Progress Notes (Signed)
Subjective:  Patient denies any chest pain.  States breathing is slowly improving feels much better after chest tubes have been removed  Objective:  Vital Signs in the last 24 hours: Temp:  [97.6 F (36.4 C)-98 F (36.7 C)] 98 F (36.7 C) (12/17 0850) Pulse Rate:  [59-98] 65 (12/17 1000) Resp:  [16-29] 20 (12/17 1000) BP: (90-147)/(46-71) 141/64 (12/17 1000) SpO2:  [86 %-100 %] 92 % (12/17 1000) Weight:  [88.3 kg] 88.3 kg (12/17 0500)  Intake/Output from previous day: 12/16 0701 - 12/17 0700 In: 880.1 [P.O.:240; I.V.:374; IV Piggyback:266] Out: 2860 [Urine:2650; Chest Tube:210] Intake/Output from this shift: Total I/O In: 234.8 [I.V.:234.8] Out: 600 [Urine:600]  Physical Exam: Neck: no adenopathy, no carotid bruit, no JVD and supple, symmetrical, trachea midline Lungs: decreased breath sounds at bases with faint rails air entry has improved Heart: regular rate and rhythm and S1, S2 normal Abdomen: soft, non-tender; bowel sounds normal; no masses,  no organomegaly Extremities: extremities normal, atraumatic, no cyanosis or edema  Lab Results: Recent Labs    05/12/18 0347 05/13/18 0230  WBC 11.0* 11.6*  HGB 8.5* 8.7*  PLT 126* 150   Recent Labs    05/13/18 0230 05/13/18 1945 05/14/18 0548  NA 136  --  136  K 3.3* 5.5* 3.4*  CL 101  --  99  CO2 25  --  27  GLUCOSE 91  --  131*  BUN 16  --  15  CREATININE 1.32*  --  1.25*   No results for input(s): TROPONINI in the last 72 hours.  Invalid input(s): CK, MB Hepatic Function Panel Recent Labs    05/13/18 0230  PROT 5.5*  ALBUMIN 2.3*  AST 21  ALT 19  ALKPHOS 51  BILITOT 0.7   No results for input(s): CHOL in the last 72 hours. No results for input(s): PROTIME in the last 72 hours.  Imaging: Imaging results have been reviewed and Dg Chest Port 1 View  Result Date: 05/13/2018 CLINICAL DATA:  PICC placement. Status post mitral valve repair and tricuspid valve repair and clipping of the left atrial  appendage. EXAM: PORTABLE CHEST 1 VIEW 12:09 p.m. COMPARISON:  05/13/2018 at 5:39 a.m. FINDINGS: Left-sided PICC is again noted. PICC tip is in the right atrium 6.4 cm below the carina. Slight atelectasis at the lung bases, essentially unchanged. Bilateral chest tubes are unchanged. No pneumothorax. IMPRESSION: PICC tip is in the right atrium 6.4 cm below the carina. No other change. Electronically Signed   By: Lorriane Shire M.D.   On: 05/13/2018 12:19   Dg Chest Port 1 View  Result Date: 05/13/2018 CLINICAL DATA:  Status post cardiac surgery.  Assess support tubes. EXAM: PORTABLE CHEST 1 VIEW COMPARISON:  Portable chest x-ray of May 11, 2018 FINDINGS: The lungs are reasonably well inflated. The interstitial markings have improved. There remains increased interstitial density at the lung bases. There is no pneumothorax. The bilateral chest tubes are in stable position. The cardiac silhouette remains enlarged. The central pulmonary vascularity remains prominent. The prosthetic valve ring and the left atrial appendage clip are in stable position. The left-sided PICC line tip projects over the distal third of the SVC. IMPRESSION: Slight interval improvement in the appearance of the pulmonary interstitium. No pneumothorax nor large pleural effusion. Stable enlargement of cardiac silhouette with mild pulmonary vascular congestion. The chest tubes are in stable position as is the PICC line. Electronically Signed   By: David  Martinique M.D.   On: 05/13/2018 07:13  Cardiac Studies:  Assessment/Plan:  Severe symptomatic mitral regurgitation/tricuspid regurgitation status post MVR/TVR and annuloplasty/Maze procedure and left atrial appendage clippingpostop day5, doing well Mild volume overload improved. Postop atelectasis Pulmonary hypertension. 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 Hypertension. Diabetes  mellitus. Hyperlipidemia. Osteoarthritis Plan Encouraged incentive spirometry. Ambulate as tolerated.   LOS: 5 days    [name] 05/14/2018, 12:50 PM

## 2018-05-14 NOTE — Progress Notes (Signed)
[facility] BRIEF SICU PROGRESS NOTE  5 Days Post-Op  S/P Procedure(s) (LRB): TRANSESOPHAGEAL ECHOCARDIOGRAM (TEE) (N/A) CLIPPING OF ATRIAL APPENDAGE USING ATRICLIP PRO2 CLIP SIZE 45MM (N/A) MAZE (N/A) MITRAL VALVE REPAIR (MVR) USING MEMO 4D RING SIZE 26MM (N/A) TRICUSPID VALVE REPAIR USING MC3 RING SIZE 28MM (N/A)   Stable day  Plan: Waiting for bed on 4E  Rexene Alberts, MD 05/14/2018 7:05 PM

## 2018-05-14 NOTE — Care Management Note (Addendum)
Case Management Note  Patient Details  Name: Diane [name] MRN: [id number] Date of Birth: 07/28/1940  Subjective/Objective:  76 yo female presented for CP; s/p MVR, MAZE                Action/Plan: CM following for dispositional needs and recommendations for HHPT. Patient sleeping at this time; CM team will continue following to arrange services.   Expected Discharge Date:                  Expected Discharge Plan:  Home w Home Health Services  In-House Referral:  NA  Discharge planning Services  CM Consult  Post Acute Care Choice:  Home Health Choice offered to:  Patient  DME Arranged:  N/A DME Agency:  NA  HH Arranged:  NA HH Agency:  NA  Status of Service:  In process, will continue to follow  If discussed at Long Length of Stay Meetings, dates discussed:    Additional Comments: 05/15/18 @ 1615-[name] RNCM-CM met with patient to discuss dispositional needs. Patient states living at home alone, independent with ADLs, utilizing a RW and cane to assist with ambulation; BSC/tub bench available. PCP verified as: Dr. Hal Stoneking; pharmacy of choice: [facility]. CM discussed PT recommendations for HHPT with 24hr assistance (CMS HH compare list provided) vs [facility] SNF if unable to arrange family assistance. Patient would like to discuss dispositional plan with her granddaughter [name] later today and will consider [facility] SNF if she's unable to arrange assistance. CM team will f/u.  [name] RN, BSN, NCM-BC, ACM-RN [phone] 05/14/2018, 11:56 AM  

## 2018-05-14 NOTE — Progress Notes (Signed)
[address]       [place] [address]             [phone]      5 Days Post-Op Procedure(s) (LRB): TRANSESOPHAGEAL ECHOCARDIOGRAM (TEE) (N/A) CLIPPING OF ATRIAL APPENDAGE USING ATRICLIP PRO2 CLIP SIZE 45MM (N/A) MAZE (N/A) MITRAL VALVE REPAIR (MVR) USING MEMO 4D RING SIZE 26MM (N/A) TRICUSPID VALVE REPAIR USING MC3 RING SIZE 28MM (N/A) Subjective: Doesn't feel as well today, somewhat weaker  Objective: Vital signs in last 24 hours: Temp:  [97.6 F (36.4 C)-98.1 F (36.7 C)] 97.6 F (36.4 C) (12/17 0430) Pulse Rate:  [55-98] 63 (12/17 0600) Cardiac Rhythm: Normal sinus rhythm;Sinus bradycardia;Bundle branch block (12/17 0600) Resp:  [16-29] 16 (12/17 0600) BP: (82-141)/(46-83) 135/56 (12/17 0600) SpO2:  [86 %-100 %] 100 % (12/17 0600) Weight:  [88.3 kg] 88.3 kg (12/17 0500)  Hemodynamic parameters for last 24 hours:    Intake/Output from previous day: 12/16 0701 - 12/17 0700 In: 880.1 [P.O.:240; I.V.:374; IV Piggyback:266] Out: 2860 [Urine:2650; Chest Tube:210] Intake/Output this shift: No intake/output data recorded.  General appearance: alert, cooperative, fatigued and no distress Heart: regular rate and rhythm and occas extrasystole Lungs: mildly dim in bases Abdomen: soft, nontender Extremities: no edema Wound: incis healing well  Lab Results: Recent Labs    05/12/18 0347 05/13/18 0230  WBC 11.0* 11.6*  HGB 8.5* 8.7*  HCT 27.2* 27.5*  PLT 126* 150   BMET:  Recent Labs    05/12/18 1718 05/13/18 0230 05/13/18 1945  NA 134* 136  --   K 4.2 3.3* 5.5*  CL 103 101  --   CO2 23 25  --   GLUCOSE 98 91  --   BUN 15 16  --   CREATININE 1.26* 1.32*  --   CALCIUM 7.8* 7.9*  --     PT/INR:  Recent Labs    05/14/18 0548  LABPROT 18.6*  INR 1.58   ABG    Component Value Date/Time   PHART 7.367 05/10/2018 2308   HCO3 17.7 (L) 05/10/2018 2308   TCO2 19 (L) 05/10/2018 2308   ACIDBASEDEF 7.0 (H) 05/10/2018 2308   O2SAT  70.9 05/14/2018 0600   CBG (last 3)  Recent Labs    05/13/18 1117 05/13/18 1252 05/13/18 1609  GLUCAP 123* 132* 104*    Meds Scheduled Meds: . acetaminophen  1,000 mg Oral Q6H  . aspirin EC  81 mg Oral Daily  . atorvastatin  20 mg Oral Daily  . bisacodyl  10 mg Oral Daily   Or  . bisacodyl  10 mg Rectal Daily  . Chlorhexidine Gluconate Cloth  6 each Topical Daily  . docusate sodium  200 mg Oral Daily  . enoxaparin (LOVENOX) injection  30 mg Subcutaneous QHS  . ferrous [id number] C-folic acid  1 capsule Oral TID PC  . FLUoxetine  10 mg Oral Daily  . insulin aspart  0-15 Units Subcutaneous TID WC  . losartan  50 mg Oral Daily  . mouth rinse  15 mL Mouth Rinse BID  . pantoprazole  40 mg Oral Daily  . sodium chloride flush  10 mL Intravenous Q12H  . spironolactone  25 mg Oral Daily  . traZODone  50 mg Oral QHS  . warfarin  2.5 mg Oral q1800  . Warfarin - Physician Dosing Inpatient   Does not apply q1800   Continuous Infusions: . sodium chloride 10 mL/hr at 05/13/18 1900  . furosemide (LASIX) infusion  10 mg/hr (05/13/18 1900)  . milrinone 0.3 mcg/kg/min (05/13/18 2318)   PRN Meds:.haloperidol lactate, metoprolol tartrate, morphine injection, ondansetron (ZOFRAN) IV, sodium chloride flush, sodium chloride flush, traMADol  Xrays Dg Chest Port 1 View  Result Date: 05/13/2018 CLINICAL DATA:  PICC placement. Status post mitral valve repair and tricuspid valve repair and clipping of the left atrial appendage. EXAM: PORTABLE CHEST 1 VIEW 12:09 p.m. COMPARISON:  05/13/2018 at 5:39 a.m. FINDINGS: Left-sided PICC is again noted. PICC tip is in the right atrium 6.4 cm below the carina. Slight atelectasis at the lung bases, essentially unchanged. Bilateral chest tubes are unchanged. No pneumothorax. IMPRESSION: PICC tip is in the right atrium 6.4 cm below the carina. No other change. Electronically Signed   By: Lorriane Shire M.D.   On: 05/13/2018 12:19   Dg Chest Port 1  View  Result Date: 05/13/2018 CLINICAL DATA:  Status post cardiac surgery.  Assess support tubes. EXAM: PORTABLE CHEST 1 VIEW COMPARISON:  Portable chest x-ray of May 11, 2018 FINDINGS: The lungs are reasonably well inflated. The interstitial markings have improved. There remains increased interstitial density at the lung bases. There is no pneumothorax. The bilateral chest tubes are in stable position. The cardiac silhouette remains enlarged. The central pulmonary vascularity remains prominent. The prosthetic valve ring and the left atrial appendage clip are in stable position. The left-sided PICC line tip projects over the distal third of the SVC. IMPRESSION: Slight interval improvement in the appearance of the pulmonary interstitium. No pneumothorax nor large pleural effusion. Stable enlargement of cardiac silhouette with mild pulmonary vascular congestion. The chest tubes are in stable position as is the PICC line. Electronically Signed   By: David  Martinique M.D.   On: 05/13/2018 07:13    Assessment/Plan: S/P Procedure(s) (LRB): TRANSESOPHAGEAL ECHOCARDIOGRAM (TEE) (N/A) CLIPPING OF ATRIAL APPENDAGE USING ATRICLIP PRO2 CLIP SIZE 45MM (N/A) MAZE (N/A) MITRAL VALVE REPAIR (MVR) USING MEMO 4D RING SIZE 26MM (N/A) TRICUSPID VALVE REPAIR USING MC3 RING SIZE 28MM (N/A) POD#5  1 overall stable  2 hemodyn stable with SBP ranging 80-140's, on 0.3 millrinone- CO-OX improved to 70.9, hopefully can wean further today. Will reduce cozaar to 25  On lasix gtt art 10mg /hr Wt about 6 kg above preop, good UOP, BUN creat improved. Cont. Gtt for now, but hopefully transition soon to bolus dosing.Also on spiro 3 Rhythm junct with rate in 60's , prolonged QTc and PVC's- will stop Trazadone and Prozac and haldol with increase risk associated with this including torsades. She is very calm currently.Hopefully ICU delirium issues will resolve without meds for now 4 hypokalemia- replace 5 chest tubes 230 cc out  yesterday- d/c today 6 cont current coumadin for INR 1.58 7 rehab with PT and routine pulm toilet  LOS: 5 days    Diane Giovanni PA-C 05/14/2018 Pager [phone]

## 2018-05-15 ENCOUNTER — Inpatient Hospital Stay (HOSPITAL_COMMUNITY): Payer: Medicare Other

## 2018-05-15 LAB — CBC
HCT: 25.5 % — ABNORMAL LOW (ref 36.0–46.0)
Hemoglobin: 8.4 g/dL — ABNORMAL LOW (ref 12.0–15.0)
MCH: 27.4 pg (ref 26.0–34.0)
MCHC: 32.9 g/dL (ref 30.0–36.0)
MCV: 83.1 fL (ref 80.0–100.0)
Platelets: 200 10*3/uL (ref 150–400)
RBC: 3.07 MIL/uL — ABNORMAL LOW (ref 3.87–5.11)
RDW: 16.2 % — ABNORMAL HIGH (ref 11.5–15.5)
WBC: 10.5 10*3/uL (ref 4.0–10.5)
nRBC: 0 % (ref 0.0–0.2)

## 2018-05-15 LAB — GLUCOSE, CAPILLARY
Glucose-Capillary: 112 mg/dL — ABNORMAL HIGH (ref 70–99)
Glucose-Capillary: 104 mg/dL — ABNORMAL HIGH (ref 70–99)
Glucose-Capillary: 141 mg/dL — ABNORMAL HIGH (ref 70–99)

## 2018-05-15 LAB — COOXEMETRY PANEL
Carboxyhemoglobin: 1.7 % — ABNORMAL HIGH (ref 0.5–1.5)
Carboxyhemoglobin: 1.6 % — ABNORMAL HIGH (ref 0.5–1.5)
Methemoglobin: 1.4 % (ref 0.0–1.5)
Methemoglobin: 1.5 % (ref 0.0–1.5)
O2 Saturation: 64.6 %
O2 Saturation: 50.7 %
Total hemoglobin: 7.8 g/dL — ABNORMAL LOW (ref 12.0–16.0)
Total hemoglobin: 8.9 g/dL — ABNORMAL LOW (ref 12.0–16.0)

## 2018-05-15 LAB — PROTIME-INR
INR: 1.69
Prothrombin Time: 19.7 seconds — ABNORMAL HIGH (ref 11.4–15.2)

## 2018-05-15 LAB — BASIC METABOLIC PANEL
Anion gap: 11 (ref 5–15)
BUN: 12 mg/dL (ref 8–23)
CO2: 28 mmol/L (ref 22–32)
Calcium: 8 mg/dL — ABNORMAL LOW (ref 8.9–10.3)
Chloride: 96 mmol/L — ABNORMAL LOW (ref 98–111)
Creatinine, Ser: 1.27 mg/dL — ABNORMAL HIGH (ref 0.44–1.00)
GFR calc Af Amer: 48 mL/min — ABNORMAL LOW (ref >60)
GFR calc non Af Amer: 42 mL/min — ABNORMAL LOW (ref >60)
Glucose, Bld: 105 mg/dL — ABNORMAL HIGH (ref 70–99)
Potassium: 3.8 mmol/L (ref 3.5–5.1)
Sodium: 135 mmol/L (ref 135–145)

## 2018-05-15 MED ORDER — POTASSIUM CHLORIDE CRYS ER 20 MEQ PO TBCR
20.0000 meq | EXTENDED_RELEASE_TABLET | Freq: Two times a day (BID) | ORAL | Status: DC
  Administered 2018-05-15 – 2018-05-17 (×6): 20 meq via ORAL
  Filled 2018-05-15 (×6): qty 1

## 2018-05-15 MED ORDER — FUROSEMIDE 40 MG PO TABS
40.0000 mg | ORAL_TABLET | Freq: Two times a day (BID) | ORAL | Status: DC
  Administered 2018-05-15 – 2018-05-17 (×6): 40 mg via ORAL
  Filled 2018-05-15 (×7): qty 1

## 2018-05-15 NOTE — Progress Notes (Addendum)
[facility] DAILY ICU PROGRESS NOTE                   [address]            [place] [address]          [phone]   6 Days Post-Op Procedure(s) (LRB): TRANSESOPHAGEAL ECHOCARDIOGRAM (TEE) (N/A) CLIPPING OF ATRIAL APPENDAGE USING ATRICLIP PRO2 CLIP SIZE 45MM (N/A) MAZE (N/A) MITRAL VALVE REPAIR (MVR) USING MEMO 4D RING SIZE 26MM (N/A) TRICUSPID VALVE REPAIR USING MC3 RING SIZE 28MM (N/A)  Total Length of Stay:  LOS: 6 days   Subjective:  Diane [name] complains of diarrhea today.  She states it has been persistent since taking laxative yesterday.  She is calm and alert and denies other complaints.  Objective: Vital signs in last 24 hours: Temp:  [98 F (36.7 C)-98.6 F (37 C)] 98.6 F (37 C) (12/18 0746) Pulse Rate:  [62-82] 80 (12/18 0900) Cardiac Rhythm: Normal sinus rhythm (12/18 0800) Resp:  [14-25] 25 (12/18 0900) BP: (87-153)/(46-122) 129/118 (12/18 0856) SpO2:  [92 %-100 %] 94 % (12/18 0900) Weight:  [84.6 kg] 84.6 kg (12/18 0400)  Filed Weights   05/13/18 0229 05/14/18 0500 05/15/18 0400  Weight: 87.5 kg 88.3 kg 84.6 kg    Weight change: -3.715 kg   Intake/Output from previous day: 12/17 0701 - 12/18 0700 In: 676.1 [P.O.:200; I.V.:476.1] Out: 1150 [Urine:1150]  Intake/Output this shift: Total I/O In: 143.6 [P.O.:120; I.V.:23.6] Out: -   Current Meds: Scheduled Meds: . aspirin EC  81 mg Oral Daily  . atorvastatin  20 mg Oral Daily  . bisacodyl  10 mg Oral Daily   Or  . bisacodyl  10 mg Rectal Daily  . Chlorhexidine Gluconate Cloth  6 each Topical Daily  . docusate sodium  200 mg Oral Daily  . enoxaparin (LOVENOX) injection  30 mg Subcutaneous QHS  . ferrous [id number] C-folic acid  1 capsule Oral TID PC  . furosemide  40 mg Oral BID  . insulin aspart  0-15 Units Subcutaneous TID WC  . mouth rinse  15 mL Mouth Rinse BID  . pantoprazole  40 mg Oral Daily  . potassium chloride  20 mEq Oral BID  . sodium chloride flush  10  mL Intravenous Q12H  . spironolactone  25 mg Oral Daily  . warfarin  2.5 mg Oral q1800  . Warfarin - Physician Dosing Inpatient   Does not apply q1800   Continuous Infusions: . sodium chloride Stopped (05/14/18 1311)  . milrinone 0.3 mcg/kg/min (05/15/18 0900)   PRN Meds:.metoprolol tartrate, morphine injection, ondansetron (ZOFRAN) IV, sodium chloride flush, sodium chloride flush, traMADol  General appearance: alert, cooperative and no distress Heart: regular rate and rhythm Lungs: clear to auscultation bilaterally Abdomen: soft, non-tender; bowel sounds normal; no masses,  no organomegaly Extremities: edema trace Wound: clean and dry  Lab Results: CBC: Recent Labs    05/13/18 0230 05/15/18 0614  WBC 11.6* 10.5  HGB 8.7* 8.4*  HCT 27.5* 25.5*  PLT 150 200   BMET:  Recent Labs    05/14/18 0548 05/15/18 0612  NA 136 135  K 3.4* 3.8  CL 99 96*  CO2 27 28  GLUCOSE 131* 105*  BUN 15 12  CREATININE 1.25* 1.27*  CALCIUM 7.5* 8.0*    CMET: Lab Results  Component Value Date   WBC 10.5 05/15/2018   HGB 8.4 (L) 05/15/2018   HCT 25.5 (L) 05/15/2018   PLT 200 05/15/2018  GLUCOSE 105 (H) 05/15/2018   CHOL 107 05/03/2018   TRIG 22 05/03/2018   HDL 46 05/03/2018   LDLCALC 57 05/03/2018   ALT 19 05/13/2018   AST 21 05/13/2018   NA 135 05/15/2018   K 3.8 05/15/2018   CL 96 (L) 05/15/2018   CREATININE 1.27 (H) 05/15/2018   BUN 12 05/15/2018   CO2 28 05/15/2018   TSH 1.495 06/20/2017   INR 1.69 05/15/2018   HGBA1C 5.9 (H) 05/03/2018   MICROALBUR 0.50 11/04/2009      PT/INR:  Recent Labs    05/15/18 0612  LABPROT 19.7*  INR 1.69   Radiology: Dg Chest 2 View  Result Date: 05/15/2018 CLINICAL DATA:  Follow-up atelectasis EXAM: CHEST - 2 VIEW COMPARISON:  05/13/2018 FINDINGS: Cardiac shadow is enlarged in size. Postsurgical changes are again seen and stable. Linear scarring is noted in the area of prior chest tube placement on the left. The left chest tube  has been removed without evidence of pneumothorax. Left-sided PICC line is noted at the superior aspect of the right atrium. Some mild right basilar atelectasis remains. IMPRESSION: Mild linear density along the course of previous chest tube. Mild stable right basilar atelectasis. Electronically Signed   By: Inez Catalina M.D.   On: 05/15/2018 09:54     Assessment/Plan: S/P Procedure(s) (LRB): TRANSESOPHAGEAL ECHOCARDIOGRAM (TEE) (N/A) CLIPPING OF ATRIAL APPENDAGE USING ATRICLIP PRO2 CLIP SIZE 45MM (N/A) MAZE (N/A) MITRAL VALVE REPAIR (MVR) USING MEMO 4D RING SIZE 26MM (N/A) TRICUSPID VALVE REPAIR USING MC3 RING SIZE 28MM (N/A)  1. CV- NSR this AM, Co-ox is 64.6, remains on Milrinone at 0.3, continue to wean as tolerated 2. Pulm- no pneumothorax, stable appearance of atelectasis, continue to wean oxygen as tolerated 3. INR 1.69, continue coumadin at 2.5 mg daily 4. Renal- creatinine remains relatively stable, weight is improving, off lasix drip, transitioned to oral Lasix 40 mg BID 5. GI- diarrhea, due to laxative use will stop all stool softners 6. DM- sugars controlled, continue current regimen 7. Dispo- patient stable, in NSR Co-ox is 64.6 wean Milrinone as tolerated, can d/c EPW today, continue coumadin, stop stool softeners due to diarrhea, awaiting bed on 4E     Erin Barrett 05/15/2018 10:05 AM    I have seen and examined the patient and agree with the assessment and plan as outlined.  Rexene Alberts, MD

## 2018-05-15 NOTE — Progress Notes (Signed)
Patient refused to walk in the hall at this time, patient education completed she verbalized understanding, will continue to monitor.

## 2018-05-15 NOTE — Progress Notes (Signed)
Physical Therapy Treatment Patient Details Name: Diane [name] MRN: [id number] DOB: June 26, 1940 Today's Date: 05/15/2018    History of Present Illness Patient is a 77 yo female status post MVR/TVR and annuloplasty/Maze procedure and left atrial appendage clipping. History of severe symptomatic mitral regurgitation. Pulmonary hypertension. 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 Increased ambulation distance to 200 feet using [place] walker and supervision. HR 77-92 bpm, 170/99 BP, 100% SpO2 on 2L O2. Needs cues for maintenance of sternal precautions.    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 Weight Bearing: (R mastectomy, no sticks pricks BP)    Mobility  Bed Mobility               General bed mobility comments: OOB in chair  Transfers Overall transfer level: Needs assistance Equipment used: Ambulation equipment used Transfers: Sit to/from Stand Sit to Stand: Supervision         General transfer comment: cues for hand placement for maintaining sternal precautions  Ambulation/Gait Ambulation/Gait assistance: Supervision Gait Distance (Feet): 200 Feet Assistive device: (eva walker) Gait Pattern/deviations: Step-through pattern;Decreased stride length;Trunk flexed Gait velocity: decreased   General Gait Details: cues for hip extension to promote upright stance.    Stairs             Wheelchair Mobility    Modified Rankin (Stroke Patients Only)       Balance Overall balance assessment: Mild deficits observed, not formally tested                                           Cognition Arousal/Alertness: Awake/alert Behavior During Therapy: WFL for tasks assessed/performed Overall Cognitive Status: Within Functional Limits for tasks assessed                                        Exercises General Exercises - Lower Extremity Long Arc Quad: 10 reps;Both;Seated    General Comments        Pertinent Vitals/Pain Pain Assessment: No/denies pain    Home Living                      Prior Function            PT Goals (current goals can now be found in the care plan section) Acute Rehab PT Goals Patient Stated Goal: to go home PT Goal Formulation: With patient Time For Goal Achievement: 05/27/18 Potential to Achieve Goals: Good Progress towards PT goals: Progressing toward goals    Frequency    Min 3X/week      PT Plan Current plan remains appropriate    Co-evaluation              AM-PAC PT "6 Clicks" Mobility   Outcome Measure  Help needed turning from your back to your side while in a flat bed without using bedrails?: A Little Help needed moving from lying on your back to sitting on the side  of a flat bed without using bedrails?: A Little Help needed moving to and from a bed to a chair (including a wheelchair)?: A Little Help needed standing up from a chair using your arms (e.g., wheelchair or bedside chair)?: A Little Help needed to walk in hospital room?: A Little Help needed climbing 3-5 steps with a railing? : A Lot 6 Click Score: 17    End of Session Equipment Utilized During Treatment: Oxygen Activity Tolerance: Patient tolerated treatment well;Patient limited by fatigue Patient left: with call bell/phone within reach;in chair Nurse Communication: Mobility status PT Visit Diagnosis: Difficulty in walking, not elsewhere classified (R26.2)     Time: 4503-8882 PT Time Calculation (min) (ACUTE ONLY): 20 min  Charges:  $Gait Training: 8-22 mins                     Ellamae Sia, PT, DPT [place] Pager [phone] Office [phone]    Willy Eddy 05/15/2018, 12:18 PM

## 2018-05-15 NOTE — Progress Notes (Signed)
Patient arrived the unit from [facility] on a hospital bed, assessment completed see flowsheet, placed on monitor, ccmd notified, patient oriented to room and staff, bed in lowest position, call bell within reach will monitor.

## 2018-05-15 NOTE — Progress Notes (Signed)
EPW removed per order. Bedrest implemented. Vitals stable.

## 2018-05-15 NOTE — Progress Notes (Signed)
Subjective:  Complains of diarrhea after taking laxatives no fever or chills.  No abdominal pain.  Denies any chest pain or shortness of breath.  Overall feels better.  Objective:  Vital Signs in the last 24 hours: Temp:  [98 F (36.7 C)-98.6 F (37 C)] 98.6 F (37 C) (12/18 0746) Pulse Rate:  [62-82] 77 (12/18 1100) Resp:  [14-25] 25 (12/18 1100) BP: (87-153)/(46-122) 129/118 (12/18 0856) SpO2:  [92 %-100 %] 94 % (12/18 1100) Weight:  [84.6 kg] 84.6 kg (12/18 0400)  Intake/Output from previous day: 12/17 0701 - 12/18 0700 In: 676.1 [P.O.:200; I.V.:476.1] Out: 1150 [Urine:1150] Intake/Output from this shift: Total I/O In: 159.4 [P.O.:120; I.V.:39.4] Out: -   Physical Exam: Neck: no adenopathy, no carotid bruit, no JVD and supple, symmetrical, trachea midline Lungs: Decreased breath sound at bases Heart: regular rate and rhythm and S1, S2 normal Abdomen: soft, non-tender; bowel sounds normal; no masses,  no organomegaly Extremities: extremities normal, atraumatic, no cyanosis or edema  Lab Results: Recent Labs    05/13/18 0230 05/15/18 0614  WBC 11.6* 10.5  HGB 8.7* 8.4*  PLT 150 200   Recent Labs    05/14/18 0548 05/15/18 0612  NA 136 135  K 3.4* 3.8  CL 99 96*  CO2 27 28  GLUCOSE 131* 105*  BUN 15 12  CREATININE 1.25* 1.27*   No results for input(s): TROPONINI in the last 72 hours.  Invalid input(s): CK, MB Hepatic Function Panel Recent Labs    05/13/18 0230  PROT 5.5*  ALBUMIN 2.3*  AST 21  ALT 19  ALKPHOS 51  BILITOT 0.7   No results for input(s): CHOL in the last 72 hours. No results for input(s): PROTIME in the last 72 hours.  Imaging: Imaging results have been reviewed and Dg Chest 2 View  Result Date: 05/15/2018 CLINICAL DATA:  Follow-up atelectasis EXAM: CHEST - 2 VIEW COMPARISON:  05/13/2018 FINDINGS: Cardiac shadow is enlarged in size. Postsurgical changes are again seen and stable. Linear scarring is noted in the area of prior chest  tube placement on the left. The left chest tube has been removed without evidence of pneumothorax. Left-sided PICC line is noted at the superior aspect of the right atrium. Some mild right basilar atelectasis remains. IMPRESSION: Mild linear density along the course of previous chest tube. Mild stable right basilar atelectasis. Electronically Signed   By: Inez Catalina M.D.   On: 05/15/2018 09:54   Dg Chest Port 1 View  Result Date: 05/13/2018 CLINICAL DATA:  PICC placement. Status post mitral valve repair and tricuspid valve repair and clipping of the left atrial appendage. EXAM: PORTABLE CHEST 1 VIEW 12:09 p.m. COMPARISON:  05/13/2018 at 5:39 a.m. FINDINGS: Left-sided PICC is again noted. PICC tip is in the right atrium 6.4 cm below the carina. Slight atelectasis at the lung bases, essentially unchanged. Bilateral chest tubes are unchanged. No pneumothorax. IMPRESSION: PICC tip is in the right atrium 6.4 cm below the carina. No other change. Electronically Signed   By: Lorriane Shire M.D.   On: 05/13/2018 12:19    Cardiac Studies:  Assessment/Plan:  Severe symptomatic mitral regurgitation/tricuspid regurgitation status post MVR/TVR and annuloplasty/Maze procedure and left atrial appendage clippingpostop day5, doing well Mild volume overload improved. Postop atelectasis Pulmonary hypertension. 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 Hypertension. Diabetes mellitus. Hyperlipidemia. Osteoarthritis Diarrhea secondary to laxatives Plan Continue present management per CVTS Encouraged incentive spirometry  LOS: 6 days    [name] 05/15/2018,  11:54 AM

## 2018-05-16 ENCOUNTER — Inpatient Hospital Stay (HOSPITAL_COMMUNITY): Payer: Medicare Other

## 2018-05-16 LAB — GLUCOSE, CAPILLARY
Glucose-Capillary: 117 mg/dL — ABNORMAL HIGH (ref 70–99)
Glucose-Capillary: 148 mg/dL — ABNORMAL HIGH (ref 70–99)
Glucose-Capillary: 99 mg/dL (ref 70–99)
Glucose-Capillary: 116 mg/dL — ABNORMAL HIGH (ref 70–99)

## 2018-05-16 LAB — BASIC METABOLIC PANEL
Anion gap: 11 (ref 5–15)
BUN: 9 mg/dL (ref 8–23)
CO2: 27 mmol/L (ref 22–32)
Calcium: 7.9 mg/dL — ABNORMAL LOW (ref 8.9–10.3)
Chloride: 98 mmol/L (ref 98–111)
Creatinine, Ser: 1.18 mg/dL — ABNORMAL HIGH (ref 0.44–1.00)
GFR calc Af Amer: 53 mL/min — ABNORMAL LOW (ref >60)
GFR calc non Af Amer: 45 mL/min — ABNORMAL LOW (ref >60)
Glucose, Bld: 105 mg/dL — ABNORMAL HIGH (ref 70–99)
Potassium: 3.9 mmol/L (ref 3.5–5.1)
Sodium: 136 mmol/L (ref 135–145)

## 2018-05-16 LAB — CBC
HCT: 24.7 % — ABNORMAL LOW (ref 36.0–46.0)
Hemoglobin: 7.8 g/dL — ABNORMAL LOW (ref 12.0–15.0)
MCH: 26 pg (ref 26.0–34.0)
MCHC: 31.6 g/dL (ref 30.0–36.0)
MCV: 82.3 fL (ref 80.0–100.0)
Platelets: 201 10*3/uL (ref 150–400)
RBC: 3 MIL/uL — ABNORMAL LOW (ref 3.87–5.11)
RDW: 16.4 % — ABNORMAL HIGH (ref 11.5–15.5)
WBC: 9.7 10*3/uL (ref 4.0–10.5)
nRBC: 0 % (ref 0.0–0.2)

## 2018-05-16 LAB — PROTIME-INR
INR: 1.66
Prothrombin Time: 19.4 seconds — ABNORMAL HIGH (ref 11.4–15.2)

## 2018-05-16 LAB — COOXEMETRY PANEL
Carboxyhemoglobin: 1.9 % — ABNORMAL HIGH (ref 0.5–1.5)
Methemoglobin: 1.6 % — ABNORMAL HIGH (ref 0.0–1.5)
O2 Saturation: 62.6 %
Total hemoglobin: 8.4 g/dL — ABNORMAL LOW (ref 12.0–16.0)

## 2018-05-16 MED ORDER — PATIENT'S GUIDE TO USING COUMADIN BOOK
Freq: Once | Status: AC
  Administered 2018-05-16: 17:00:00
  Filled 2018-05-16: qty 1

## 2018-05-16 MED ORDER — METFORMIN HCL ER 500 MG PO TB24
500.0000 mg | ORAL_TABLET | Freq: Every day | ORAL | Status: DC
  Administered 2018-05-16 – 2018-05-20 (×5): 500 mg via ORAL
  Filled 2018-05-16 (×6): qty 1

## 2018-05-16 NOTE — Progress Notes (Signed)
CARDIAC REHAB PHASE I   Offered to walk with pt. Pt declining stating she walked once today and feels weak and tired. Pt educated on importance of walks. Pt continues to decline. Will follow-up later as time allows.  Rufina Falco, RN BSN 05/16/2018 1:49 PM

## 2018-05-16 NOTE — Progress Notes (Signed)
  Echocardiogram 2D Echocardiogram has been performed.  Diane [name] 05/16/2018, 3:04 PM

## 2018-05-16 NOTE — Progress Notes (Addendum)
[address]       [place] [address]             [phone]      7 Days Post-Op Procedure(s) (LRB): TRANSESOPHAGEAL ECHOCARDIOGRAM (TEE) (N/A) CLIPPING OF ATRIAL APPENDAGE USING ATRICLIP PRO2 CLIP SIZE 45MM (N/A) MAZE (N/A) MITRAL VALVE REPAIR (MVR) USING MEMO 4D RING SIZE 26MM (N/A) TRICUSPID VALVE REPAIR USING MC3 RING SIZE 28MM (N/A) Subjective: Some diarrhea, and some vaginal bleeding  Objective: Vital signs in last 24 hours: Temp:  [97.7 F (36.5 C)-98.8 F (37.1 C)] 97.7 F (36.5 C) (12/19 0908) Pulse Rate:  [72-80] 72 (12/19 0414) Cardiac Rhythm: Normal sinus rhythm (12/18 2356) Resp:  [16-49] 21 (12/19 0908) BP: (88-156)/(36-122) 110/42 (12/19 0908) SpO2:  [91 %-100 %] 96 % (12/19 0414) Weight:  [86.3 kg] 86.3 kg (12/19 0429)  Hemodynamic parameters for last 24 hours:    Intake/Output from previous day: 12/18 0701 - 12/19 0700 In: 527.8 [P.O.:360; I.V.:167.8] Out: 350 [Urine:350] Intake/Output this shift: No intake/output data recorded.  General appearance: alert, cooperative and no distress Heart: regular rate and rhythm and soft systolic murmur Lungs: min dim in bases, o/w clear Abdomen: benign Extremities: min edema Wound: incis healing well  Lab Results: Recent Labs    05/15/18 0614 05/16/18 0419  WBC 10.5 9.7  HGB 8.4* 7.8*  HCT 25.5* 24.7*  PLT 200 201   BMET:  Recent Labs    05/15/18 0612 05/16/18 0419  NA 135 136  K 3.8 3.9  CL 96* 98  CO2 28 27  GLUCOSE 105* 105*  BUN 12 9  CREATININE 1.27* 1.18*  CALCIUM 8.0* 7.9*    PT/INR:  Recent Labs    05/16/18 0419  LABPROT 19.4*  INR 1.66   ABG    Component Value Date/Time   PHART 7.367 05/10/2018 2308   HCO3 17.7 (L) 05/10/2018 2308   TCO2 19 (L) 05/10/2018 2308   ACIDBASEDEF 7.0 (H) 05/10/2018 2308   O2SAT 62.6 05/16/2018 0430   CBG (last 3)  Recent Labs    05/15/18 1642 05/15/18 2133 05/16/18 0613  GLUCAP 104* 141* 117*     Meds Scheduled Meds: . aspirin EC  81 mg Oral Daily  . atorvastatin  20 mg Oral Daily  . bisacodyl  10 mg Rectal Daily  . Chlorhexidine Gluconate Cloth  6 each Topical Daily  . enoxaparin (LOVENOX) injection  30 mg Subcutaneous QHS  . ferrous [id number] C-folic acid  1 capsule Oral TID PC  . furosemide  40 mg Oral BID  . insulin aspart  0-15 Units Subcutaneous TID WC  . mouth rinse  15 mL Mouth Rinse BID  . pantoprazole  40 mg Oral Daily  . potassium chloride  20 mEq Oral BID  . sodium chloride flush  10 mL Intravenous Q12H  . spironolactone  25 mg Oral Daily  . warfarin  2.5 mg Oral q1800  . Warfarin - Physician Dosing Inpatient   Does not apply q1800   Continuous Infusions: . sodium chloride Stopped (05/14/18 1311)  . milrinone 0.2 mcg/kg/min (05/16/18 0618)   PRN Meds:.metoprolol tartrate, morphine injection, ondansetron (ZOFRAN) IV, sodium chloride flush, sodium chloride flush, traMADol  Xrays Dg Chest 2 View  Result Date: 05/15/2018 CLINICAL DATA:  Follow-up atelectasis EXAM: CHEST - 2 VIEW COMPARISON:  05/13/2018 FINDINGS: Cardiac shadow is enlarged in size. Postsurgical changes are again seen and stable. Linear scarring is noted in the area of prior chest tube placement  on the left. The left chest tube has been removed without evidence of pneumothorax. Left-sided PICC line is noted at the superior aspect of the right atrium. Some mild right basilar atelectasis remains. IMPRESSION: Mild linear density along the course of previous chest tube. Mild stable right basilar atelectasis. Electronically Signed   By: Inez Catalina M.D.   On: 05/15/2018 09:54    Assessment/Plan: S/P Procedure(s) (LRB): TRANSESOPHAGEAL ECHOCARDIOGRAM (TEE) (N/A) CLIPPING OF ATRIAL APPENDAGE USING ATRICLIP PRO2 CLIP SIZE 45MM (N/A) MAZE (N/A) MITRAL VALVE REPAIR (MVR) USING MEMO 4D RING SIZE 26MM (N/A) TRICUSPID VALVE REPAIR USING MC3 RING SIZE 28MM (N/A) 1 doing well 2 diarrhea,  hopefully will resolve soon off laxatives, etc 3 vaginal bleeding- may be related to having had pure-wick urine collection device last night. 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 If conts, may need gynecology evaluation 4 H/H is down further, approaching transfusion threshold- cont to monitor especially with vaginal bleeding 5 hemodyn stable in junct rhythm . CO-OCX 62.9 - wean milrinone further 6 renal fxn normal, weight about 4 kg over preop, cont current lasix dosing 7 INR 1.66- cont current coumadin dosing for now 8 sats good on 2 liters, cont pulm toilet and rehab 9 sugars ok, resume home metformin  LOS: 7 days    [name]  PA-C 05/16/2018 Pager [phone]  I have seen and examined the patient and agree with the assessment and plan as outlined.  Recheck Hgb/Hct tomorrow.  Rexene Alberts, MD 05/16/2018 3:11 PM

## 2018-05-16 NOTE — Progress Notes (Signed)
CARDIAC REHAB PHASE I   Pt currently getting procedure at bedside. Encouraged to ambulate with RN again today. Will follow-up tomorrow.  Rufina Falco, RN BSN 05/16/2018 3:00 PM

## 2018-05-16 NOTE — Care Management Important Message (Signed)
Important Message  Patient Details  Name: Diane [name] MRN: [id number] Date of Birth: [birth year]   Medicare Important Message Given:  Yes    Megan P Shular 05/16/2018, 11:34 AM

## 2018-05-16 NOTE — Progress Notes (Signed)
Subjective:  Patient denies any chest pain or shortness of breath states diarrhea has improved.  Had minimal vaginal bleeding earlier.  Objective:  Vital Signs in the last 24 hours: Temp:  [97.7 F (36.5 C)-98.8 F (37.1 C)] 97.7 F (36.5 C) (12/19 0908) Pulse Rate:  [72-80] 72 (12/19 0414) Resp:  [16-49] 21 (12/19 0908) BP: (88-156)/(36-122) 115/64 (12/19 0909) SpO2:  [91 %-100 %] 96 % (12/19 0414) Weight:  [86.3 kg] 86.3 kg (12/19 0429)  Intake/Output from previous day: 12/18 0701 - 12/19 0700 In: 527.8 [P.O.:360; I.V.:167.8] Out: 350 [Urine:350] Intake/Output from this shift: No intake/output data recorded.  Physical Exam: Neck: no adenopathy, no carotid bruit, no JVD and supple, symmetrical, trachea midline Lungs: Decreased breath sound at bases no rales or rhonchi air entry has improved Heart: regular rate and rhythm and S1, S2 normal Abdomen: soft, non-tender; bowel sounds normal; no masses,  no organomegaly Extremities: extremities normal, atraumatic, no cyanosis or edema  Lab Results: Recent Labs    05/15/18 0614 05/16/18 0419  WBC 10.5 9.7  HGB 8.4* 7.8*  PLT 200 201   Recent Labs    05/15/18 0612 05/16/18 0419  NA 135 136  K 3.8 3.9  CL 96* 98  CO2 28 27  GLUCOSE 105* 105*  BUN 12 9  CREATININE 1.27* 1.18*   No results for input(s): TROPONINI in the last 72 hours.  Invalid input(s): CK, MB Hepatic Function Panel No results for input(s): PROT, ALBUMIN, AST, ALT, ALKPHOS, BILITOT, BILIDIR, IBILI in the last 72 hours. No results for input(s): CHOL in the last 72 hours. No results for input(s): PROTIME in the last 72 hours.  Imaging: Imaging results have been reviewed and Dg Chest 2 View  Result Date: 05/15/2018 CLINICAL DATA:  Follow-up atelectasis EXAM: CHEST - 2 VIEW COMPARISON:  05/13/2018 FINDINGS: Cardiac shadow is enlarged in size. Postsurgical changes are again seen and stable. Linear scarring is noted in the area of prior chest tube  placement on the left. The left chest tube has been removed without evidence of pneumothorax. Left-sided PICC line is noted at the superior aspect of the right atrium. Some mild right basilar atelectasis remains. IMPRESSION: Mild linear density along the course of previous chest tube. Mild stable right basilar atelectasis. Electronically Signed   By: Inez Catalina M.D.   On: 05/15/2018 09:54    Cardiac Studies:  Assessment/Plan:  Severe symptomatic mitral regurgitation/tricuspid regurgitation status post MVR/TVR and annuloplasty/Maze procedure and left atrial appendage clippingpostop day5, doing well Mild volume overloadimproved. Postop atelectasis Pulmonary hypertension. 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 Hypertension. Diabetes mellitus. Hyperlipidemia. Osteoarthritis Diarrhea secondary to laxatives Status post questionable vaginal bleeding Plan Continue present management per CVTS Increase ambulation Monitor H&H Patient will discuss with family regarding final disposition  LOS: 7 days    [name] 05/16/2018, 10:39 AM

## 2018-05-16 NOTE — Discharge Instructions (Addendum)
Mitral Valve Replacement/Repair, Care After This sheet gives you information about how to care for yourself after your procedure. Your health care provider may also give you more specific instructions. If you have problems or questions, contact your health care provider. What can I expect after the procedure? After the procedure, it is common to have:  Pain at the incision area that may last for several weeks. Follow these instructions at home: Incision care   Follow instructions from your health care provider about how to take care of your incision. Make sure you: ? Wash your hands with soap and water before you change your bandage (dressing). If soap and water are not available, use hand sanitizer. ? Change your dressing as told by your health care provider. ? Leave stitches (sutures), skin glue, or adhesive strips in place. These skin closures may need to stay in place for 2 weeks or longer. If adhesive strip edges start to loosen and curl up, you may trim the loose edges. Do not remove adhesive strips completely unless your health care provider tells you to do that.  Check your incision area every day for signs of infection. Check for: ? More redness, swelling, or pain. ? More fluid or blood. ? Warmth. ? Pus or a bad smell.  Do not apply powder or lotion to the area. Driving  Do not drive until your health care provider approves.  Do not drive or use heavy machinery while taking prescription pain medicines. Bathing  Do not take baths, swim, or use a hot tub for 2-4 weeks after surgery, or until your health care provider approves. Ask your health care provider if you may take showers.  To wash the incision site, gently wash with soap and water and pat the area dry with a clean towel. Do not rub the incision area. That may cause bleeding. Activity  Rest as told by your health care provider. Ask your health care provider when you can resume normal activities, including sexual  activity.  Avoid the following activities for 6-8 weeks, or as long as directed: ? Lifting anything that is heavier than 10 lb (4.5 kg), or the limit that your health care provider tells you. ? Pushing or pulling things with your arms.  Avoid climbing stairs and using the handrail to pull yourself up for the first 2-3 weeks after surgery.  Avoid airplane travel for 4-6 weeks, or as long as directed.  Avoid sitting for long periods of time and crossing your legs. Get up and move around at least once every 1-2 hours.  If you are taking blood thinners (anticoagulants), avoid activities that have a high risk of injury. Ask your health care provider what activities are safe for you. Lifestyle  Limit alcohol intake to no more than 1 drink a day for nonpregnant women and 2 drinks a day for men. One drink equals 12 oz of beer, 5 oz of wine, or 1 oz of hard liquor.  Do not use any products that contain nicotine or tobacco, such as cigarettes and e-cigarettes. If you need help quitting, ask your health care provider. General instructions   Take your temperature every day and weigh yourself every morning for the first 7 days after surgery. Write your temperatures and weight down and take this record with you to any follow-up visits.  Take over-the-counter and prescription medicines only as told by your health care provider.  To prevent or treat constipation while you are taking prescription pain medicine, your health care  provider may recommend that you: ? Drink enough fluid to keep your urine clear or pale yellow. ? Take over-the-counter or prescription medicines. ? Eat foods that are high in fiber, such as fresh fruits and vegetables, whole grains, and beans. ? Limit foods that are high in fat and processed sugars, such as fried and sweet foods.  Follow instructions from your health care provider about eating or drinking restrictions.  Wear compression stockings for at least 2 weeks, or as  long as told by your health care provider. These stockings help to prevent blood clots and reduce swelling in your legs. If your ankles are swollen after 2 weeks, continue to wear the stockings.  Keep all follow-up visits as told by your health care provider. This is important. Contact a health care provider if:  You develop a skin rash.  Your weight is increasing each day over 2-3 days.  You gain 2 lb (1 kg) or more in a single day.  You have a fever. Get help right away if:  You develop chest pain that feels different from the pain caused by your incision.  You develop shortness of breath or difficulty breathing.  You have more redness, swelling, or pain around your incision.  You have more fluid or blood coming from your incision.  Your incision feels warm to the touch.  You have pus or a bad smell coming from your incision.  You feel light-headed. This information is not intended to replace advice given to you by your health care provider. Make sure you discuss any questions you have with your health care provider. Document Released: 12/02/2004 Document Revised: 02/25/2016 Document Reviewed: 02/25/2016 Elsevier Interactive Patient Education  2019 [place] on my medicine - Coumadin   (Warfarin)  This medication education was reviewed with me or my healthcare representative as part of my discharge preparation.  The pharmacist that spoke with me during my hospital stay was:  Arty Baumgartner, [facility]  Why was Coumadin prescribed for you? Coumadin was prescribed for you because you have a blood clot or a medical condition that can cause an increased risk of forming blood clots. Blood clots can cause serious health problems by blocking the flow of blood to the heart, lung, or brain. Coumadin can prevent harmful blood clots from forming. As a reminder your indication for Coumadin is:   Stroke Prevention Because Of Atrial Fibrillationand blood clot prevention  after heart valve surgery What test will check on my response to Coumadin? While on Coumadin (warfarin) you will need to have an INR test regularly to ensure that your dose is keeping you in the desired range. The INR (international normalized ratio) number is calculated from the result of the laboratory test called prothrombin time (PT).  If an INR APPOINTMENT HAS NOT ALREADY BEEN MADE FOR YOU please schedule an appointment to have this lab work done by your health care provider within 7 days. Your INR goal is usually a number between:  2 to 3 or your provider may give you a more narrow range like 2-2.5.  Ask your health care provider during an office visit what your goal INR is.  What  do you need to  know  About  COUMADIN? Take Coumadin (warfarin) exactly as prescribed by your healthcare provider about the same time each day.  DO NOT stop taking without talking to the doctor who prescribed the medication.  Stopping without other blood clot prevention medication to take the place of Coumadin may  increase your risk of developing a new clot or stroke.  Get refills before you run out.  What do you do if you miss a dose? If you miss a dose, take it as soon as you remember on the same day then continue your regularly scheduled regimen the next day.  Do not take two doses of Coumadin at the same time.  Important Safety Information A possible side effect of Coumadin (Warfarin) is an increased risk of bleeding. You should call your healthcare provider right away if you experience any of the following: ? Bleeding from an injury or your nose that does not stop. ? Unusual colored urine (red or dark brown) or unusual colored stools (red or black). ? Unusual bruising for unknown reasons. ? A serious fall or if you hit your head (even if there is no bleeding).  Some foods or medicines interact with Coumadin (warfarin) and might alter your response to warfarin. To help avoid this: ? Eat a balanced diet,  maintaining a consistent amount of Vitamin K. ? Notify your provider about major diet changes you plan to make. ? Avoid alcohol or limit your intake to 1 drink for women and 2 drinks for men per day. (1 drink is 5 oz. wine, 12 oz. beer, or 1.5 oz. liquor.)  Make sure that ANY health care provider who prescribes medication for you knows that you are taking Coumadin (warfarin).  Also make sure the healthcare provider who is monitoring your Coumadin knows when you have started a new medication including herbals and non-prescription products.  Coumadin (Warfarin)  Major Drug Interactions  Increased Warfarin Effect Decreased Warfarin Effect  Alcohol (large quantities) Antibiotics (esp. Septra/Bactrim, Flagyl, Cipro) Amiodarone (Cordarone) Aspirin (ASA) Cimetidine (Tagamet) Megestrol (Megace) NSAIDs (ibuprofen, naproxen, etc.) Piroxicam (Feldene) Propafenone (Rythmol SR) Propranolol (Inderal) Isoniazid (INH) Posaconazole (Noxafil) Barbiturates (Phenobarbital) Carbamazepine (Tegretol) Chlordiazepoxide (Librium) Cholestyramine (Questran) Griseofulvin Oral Contraceptives Rifampin Sucralfate (Carafate) Vitamin K   Coumadin (Warfarin) Major Herbal Interactions  Increased Warfarin Effect Decreased Warfarin Effect  Garlic Ginseng Ginkgo biloba Coenzyme Q10 Green tea St. Johns wort    Coumadin (Warfarin) FOOD Interactions  Eat a consistent number of servings per week of foods HIGH in Vitamin K (1 serving =  cup)  Collards (cooked, or boiled & drained) Kale (cooked, or boiled & drained) Mustard greens (cooked, or boiled & drained) Parsley *serving size only =  cup Spinach (cooked, or boiled & drained) Swiss chard (cooked, or boiled & drained) Turnip greens (cooked, or boiled & drained)  Eat a consistent number of servings per week of foods MEDIUM-HIGH in Vitamin K (1 serving = 1 cup)  Asparagus (cooked, or boiled & drained) Broccoli (cooked, boiled & drained, or raw &  chopped) Brussel sprouts (cooked, or boiled & drained) *serving size only =  cup Lettuce, raw (green leaf, endive, romaine) Spinach, raw Turnip greens, raw & chopped   These websites have more information on Coumadin (warfarin):  FailFactory.se; VeganReport.com.au;

## 2018-05-16 NOTE — Progress Notes (Signed)
Physical Therapy Treatment Patient Details Name: Diane [name] MRN: [id number] DOB: 07-20-1940 Today's Date: 05/16/2018    History of Present Illness Patient is a 77 yo female status post MVR/TVR and annuloplasty/Maze procedure and left atrial appendage clipping. History of severe symptomatic mitral regurgitation. Pulmonary hypertension. 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 She was able to progress to using the Rollator and demonstrated improved posture and gait speed. Ambulating 100 feet x 2 with supervision and one seated rest break. HR 75-90 bpm, SpO2 90-91% on 1L O2.     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 Weight Bearing: (R mastectomy, no sticks pricks BP)    Mobility  Bed Mobility Overal bed mobility: Modified Independent                Transfers Overall transfer level: Needs assistance Equipment used: 4-wheeled walker Transfers: Sit to/from Stand Sit to Stand: Supervision         General transfer comment: cues for hand placement for maintaining sternal precautions in addition to reminders to lock rollator prior to transfers  Ambulation/Gait Ambulation/Gait assistance: Supervision Gait Distance (Feet): 100 Feet(x2) Assistive device: 4-wheeled walker Gait Pattern/deviations: Step-through pattern;Decreased stride length Gait velocity: decreased   General Gait Details: pt with improved posture and gait speed this session. requiring one seated rest break and one standing rest break   Stairs             Wheelchair  Mobility    Modified Rankin (Stroke Patients Only)       Balance Overall balance assessment: Mild deficits observed, not formally tested                                          Cognition Arousal/Alertness: Awake/alert Behavior During Therapy: WFL for tasks assessed/performed Overall Cognitive Status: Within Functional Limits for tasks assessed                                        Exercises      General Comments        Pertinent Vitals/Pain Pain Assessment: No/denies pain    Home Living                      Prior Function            PT Goals (current goals can now be found in the care plan section) Acute Rehab PT Goals Patient Stated Goal: to go home PT Goal Formulation: With patient Time For Goal Achievement: 05/27/18 Potential to Achieve Goals: Good Progress towards PT goals: Progressing toward goals    Frequency    Min 3X/week      PT Plan Current plan remains appropriate    Co-evaluation              AM-PAC PT "6 Clicks" Mobility   Outcome Measure  Help needed turning from your back to your side  while in a flat bed without using bedrails?: A Little Help needed moving from lying on your back to sitting on the side of a flat bed without using bedrails?: A Little Help needed moving to and from a bed to a chair (including a wheelchair)?: A Little Help needed standing up from a chair using your arms (e.g., wheelchair or bedside chair)?: A Little Help needed to walk in hospital room?: A Little Help needed climbing 3-5 steps with a railing? : A Lot 6 Click Score: 17    End of Session Equipment Utilized During Treatment: Oxygen Activity Tolerance: Patient tolerated treatment well;Patient limited by fatigue Patient left: with call bell/phone within reach;in bed Nurse Communication: Mobility status PT Visit Diagnosis: Difficulty in walking, not elsewhere classified (R26.2)     Time: 1017-5102 PT  Time Calculation (min) (ACUTE ONLY): 24 min  Charges:  $Therapeutic Activity: 23-37 mins                    Diane [name], PT, DPT Acute Rehabilitation Services Pager [phone] Office [phone]    Willy Eddy 05/16/2018, 12:48 PM

## 2018-05-17 LAB — ECHOCARDIOGRAM COMPLETE
Height: 67 in
Weight: 3044.8 oz

## 2018-05-17 LAB — GLUCOSE, CAPILLARY
Glucose-Capillary: 118 mg/dL — ABNORMAL HIGH (ref 70–99)
Glucose-Capillary: 95 mg/dL (ref 70–99)
Glucose-Capillary: 87 mg/dL (ref 70–99)
Glucose-Capillary: 113 mg/dL — ABNORMAL HIGH (ref 70–99)

## 2018-05-17 LAB — CBC
HCT: 24.4 % — ABNORMAL LOW (ref 36.0–46.0)
Hemoglobin: 7.9 g/dL — ABNORMAL LOW (ref 12.0–15.0)
MCH: 26.9 pg (ref 26.0–34.0)
MCHC: 32.4 g/dL (ref 30.0–36.0)
MCV: 83 fL (ref 80.0–100.0)
Platelets: 241 10*3/uL (ref 150–400)
RBC: 2.94 MIL/uL — ABNORMAL LOW (ref 3.87–5.11)
RDW: 16.8 % — ABNORMAL HIGH (ref 11.5–15.5)
WBC: 11 10*3/uL — ABNORMAL HIGH (ref 4.0–10.5)
nRBC: 0 % (ref 0.0–0.2)

## 2018-05-17 LAB — BASIC METABOLIC PANEL
Anion gap: 12 (ref 5–15)
BUN: 7 mg/dL — ABNORMAL LOW (ref 8–23)
CO2: 26 mmol/L (ref 22–32)
Calcium: 8.1 mg/dL — ABNORMAL LOW (ref 8.9–10.3)
Chloride: 98 mmol/L (ref 98–111)
Creatinine, Ser: 1.33 mg/dL — ABNORMAL HIGH (ref 0.44–1.00)
GFR calc Af Amer: 46 mL/min — ABNORMAL LOW (ref >60)
GFR calc non Af Amer: 39 mL/min — ABNORMAL LOW (ref >60)
Glucose, Bld: 112 mg/dL — ABNORMAL HIGH (ref 70–99)
Potassium: 4 mmol/L (ref 3.5–5.1)
Sodium: 136 mmol/L (ref 135–145)

## 2018-05-17 LAB — PROTIME-INR
INR: 1.83
Prothrombin Time: 20.9 seconds — ABNORMAL HIGH (ref 11.4–15.2)

## 2018-05-17 LAB — COOXEMETRY PANEL
Carboxyhemoglobin: 2.4 % — ABNORMAL HIGH (ref 0.5–1.5)
Methemoglobin: 0.9 % (ref 0.0–1.5)
O2 Saturation: 67.9 %
Total hemoglobin: 8.3 g/dL — ABNORMAL LOW (ref 12.0–16.0)

## 2018-05-17 MED ORDER — WARFARIN SODIUM 3 MG PO TABS
3.0000 mg | ORAL_TABLET | Freq: Every day | ORAL | Status: DC
  Administered 2018-05-17 – 2018-05-19 (×3): 3 mg via ORAL
  Filled 2018-05-17 (×3): qty 1

## 2018-05-17 NOTE — Progress Notes (Signed)
Subjective:  Doing well.  Denies any chest pain or shortness of breath.  No further bleeding.  States diarrhea is improving.  Denies fever or chills.  Objective:  Vital Signs in the last 24 hours: Temp:  [97.8 F (36.6 C)-98.8 F (37.1 C)] 97.8 F (36.6 C) (12/20 1326) Pulse Rate:  [73-83] 73 (12/20 1326) Resp:  [17-26] 22 (12/20 1326) BP: (109-138)/(61-95) 121/61 (12/20 1326) SpO2:  [84 %-96 %] 92 % (12/20 1326) Weight:  [83.8 kg] 83.8 kg (12/20 0430)  Intake/Output from previous day: 12/19 0701 - 12/20 0700 In: 360 [P.O.:360] Out: -  Intake/Output from this shift: Total I/O In: 120 [P.O.:120] Out: -   Physical Exam: Neck: no adenopathy, no carotid bruit, no JVD and supple, symmetrical, trachea midline Lungs: clear to auscultation bilaterally Heart: regular rate and rhythm and S1, S2 normal Abdomen: soft, non-tender; bowel sounds normal; no masses,  no organomegaly Extremities: extremities normal, atraumatic, no cyanosis or edema  Lab Results: Recent Labs    05/16/18 0419 05/17/18 0422  WBC 9.7 11.0*  HGB 7.8* 7.9*  PLT 201 241   Recent Labs    05/16/18 0419 05/17/18 0422  NA 136 136  K 3.9 4.0  CL 98 98  CO2 27 26  GLUCOSE 105* 112*  BUN 9 7*  CREATININE 1.18* 1.33*   No results for input(s): TROPONINI in the last 72 hours.  Invalid input(s): CK, MB Hepatic Function Panel No results for input(s): PROT, ALBUMIN, AST, ALT, ALKPHOS, BILITOT, BILIDIR, IBILI in the last 72 hours. No results for input(s): CHOL in the last 72 hours. No results for input(s): PROTIME in the last 72 hours.  Imaging: Imaging results have been reviewed and No results found.  Cardiac Studies:  Assessment/Plan:  Severe symptomatic mitral regurgitation/tricuspid regurgitation status post MVR/TVR and annuloplasty/Maze procedure and left atrial appendage clippingpostop day7, doing well Mild volume overloadimproved. Postop atelectasis Pulmonary hypertension. Nonobstructive  CAD. 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 Hypertension. Diabetes mellitus. Hyperlipidemia. Osteoarthritis Diarrhea secondary to laxatives Status post questionable vaginal bleeding Plan Continue present management. Increase ambulation as tolerated. Discharge home possibly Monday  LOS: 8 days    [name] 05/17/2018, 4:17 PM

## 2018-05-17 NOTE — Plan of Care (Signed)
  Problem: Health Behavior/Discharge Planning: Goal: Ability to manage health-related needs will improve Outcome: Progressing   Problem: Clinical Measurements: Goal: Respiratory complications will improve Outcome: Progressing   

## 2018-05-17 NOTE — Care Management Note (Signed)
Case Management Note Initial CM note completed by Midge Minium RN, BSN, NCM-BC, ACM-RN [phone] 05/14/2018, 11:56 AM  Patient Details  Name: Diane [name] MRN: [id number] Date of Birth: [birth year]  Subjective/Objective:  77 yo female presented for CP; s/p MVR, MAZE                Action/Plan: CM following for dispositional needs and recommendations for HHPT. Patient sleeping at this time; CM team will continue following to arrange services.   Expected Discharge Date:                  Expected Discharge Plan:  [place]  In-House Referral:  NA  Discharge planning Services  CM Consult  Post Acute Care Choice:  Home Health Choice offered to:  Patient  DME Arranged:  N/A DME Agency:  NA  HH Arranged:  RN, PT, Nurse's Aide [place] Agency:  [facility]  Status of Service:  In process, will continue to follow  If discussed at Long Length of Stay Meetings, dates discussed:    Discharge Disposition: home/home health   Additional Comments:  05/17/18- 66- [name] RN, CM- follow up done with pt for transition of care needs- orders have been placed for HHRN/PT/aide- spoke with pt at bedside- list provided per CMS website- for [facility] agency choice- per pt she would like to use [facility] for services- states her granddaughter will be staying with her and her daughter also checking in on her. She reports that she has walker, canes, and elevated toilet seat at home. Referral called to Butch Penny with [facility] - referral accepted.   05/15/18 @ 1615-[name] RNCM-CM met with patient to discuss dispositional needs. Patient states living at home alone, independent with ADLs, utilizing a RW and cane to assist with ambulation; BSC/tub bench available. PCP verified as: Dr. Lajean Manes; pharmacy of choice: [facility]. CM discussed PT recommendations for HHPT with 24hr assistance (CMS HH compare list provided) vs [facility] SNF if unable to arrange family assistance. Patient would  like to discuss dispositional plan with her granddaughter [name] later today and will consider [facility] SNF if she's unable to arrange assistance. CM team will f/u.  Midge Minium RN, BSN, NCM-BC, ACM-RN [phone] 05/17/2018, 2:07 PM

## 2018-05-17 NOTE — Progress Notes (Addendum)
[address]       [place] [address]             [phone]      8 Days Post-Op Procedure(s) (LRB): TRANSESOPHAGEAL ECHOCARDIOGRAM (TEE) (N/A) CLIPPING OF ATRIAL APPENDAGE USING ATRICLIP PRO2 CLIP SIZE 45MM (N/A) MAZE (N/A) MITRAL VALVE REPAIR (MVR) USING MEMO 4D RING SIZE 26MM (N/A) TRICUSPID VALVE REPAIR USING MC3 RING SIZE 28MM (N/A) Subjective: conts to feel better, no further vaginal bleeding  Objective: Vital signs in last 24 hours: Temp:  [97.6 F (36.4 C)-98.8 F (37.1 C)] 98.1 F (36.7 C) (12/20 0821) Pulse Rate:  [78-83] 78 (12/20 0421) Cardiac Rhythm: Normal sinus rhythm (12/20 0700) Resp:  [17-49] 17 (12/20 0821) BP: (96-138)/(42-95) 109/61 (12/20 0821) SpO2:  [84 %-96 %] 96 % (12/20 0821) Weight:  [83.8 kg] 83.8 kg (12/20 0430)  Hemodynamic parameters for last 24 hours:    Intake/Output from previous day: 12/19 0701 - 12/20 0700 In: 360 [P.O.:360] Out: -  Intake/Output this shift: No intake/output data recorded.  General appearance: alert, cooperative and no distress Heart: regular rate and rhythm Lungs: clear to auscultation bilaterally Abdomen: benign Extremities: no significant edema Wound: incis healing well  Lab Results: Recent Labs    05/16/18 0419 05/17/18 0422  WBC 9.7 11.0*  HGB 7.8* 7.9*  HCT 24.7* 24.4*  PLT 201 241   BMET:  Recent Labs    05/16/18 0419 05/17/18 0422  NA 136 136  K 3.9 4.0  CL 98 98  CO2 27 26  GLUCOSE 105* 112*  BUN 9 7*  CREATININE 1.18* 1.33*  CALCIUM 7.9* 8.1*    PT/INR:  Recent Labs    05/17/18 0422  LABPROT 20.9*  INR 1.83   ABG    Component Value Date/Time   PHART 7.367 05/10/2018 2308   HCO3 17.7 (L) 05/10/2018 2308   TCO2 19 (L) 05/10/2018 2308   ACIDBASEDEF 7.0 (H) 05/10/2018 2308   O2SAT 67.9 05/17/2018 0455   CBG (last 3)  Recent Labs    05/16/18 1719 05/16/18 2116 05/17/18 0603  GLUCAP 99 116* 118*    Meds Scheduled Meds: . aspirin EC  81 mg  Oral Daily  . atorvastatin  20 mg Oral Daily  . bisacodyl  10 mg Rectal Daily  . Chlorhexidine Gluconate Cloth  6 each Topical Daily  . ferrous [id number] C-folic acid  1 capsule Oral TID PC  . furosemide  40 mg Oral BID  . insulin aspart  0-15 Units Subcutaneous TID WC  . mouth rinse  15 mL Mouth Rinse BID  . metFORMIN  500 mg Oral Q breakfast  . pantoprazole  40 mg Oral Daily  . potassium chloride  20 mEq Oral BID  . sodium chloride flush  10 mL Intravenous Q12H  . spironolactone  25 mg Oral Daily  . warfarin  3 mg Oral q1800  . Warfarin - Physician Dosing Inpatient   Does not apply q1800   Continuous Infusions: . sodium chloride Stopped (05/14/18 1311)  . milrinone 0.1 mcg/kg/min (05/17/18 0655)   PRN Meds:.metoprolol tartrate, morphine injection, ondansetron (ZOFRAN) IV, sodium chloride flush, sodium chloride flush, traMADol  Xrays Dg Chest 2 View  Result Date: 05/15/2018 CLINICAL DATA:  Follow-up atelectasis EXAM: CHEST - 2 VIEW COMPARISON:  05/13/2018 FINDINGS: Cardiac shadow is enlarged in size. Postsurgical changes are again seen and stable. Linear scarring is noted in the area of prior chest tube placement on the left. The left  chest tube has been removed without evidence of pneumothorax. Left-sided PICC line is noted at the superior aspect of the right atrium. Some mild right basilar atelectasis remains. IMPRESSION: Mild linear density along the course of previous chest tube. Mild stable right basilar atelectasis. Electronically Signed   By: Inez Catalina M.D.   On: 05/15/2018 09:54    Assessment/Plan: S/P Procedure(s) (LRB): TRANSESOPHAGEAL ECHOCARDIOGRAM (TEE) (N/A) CLIPPING OF ATRIAL APPENDAGE USING ATRICLIP PRO2 CLIP SIZE 45MM (N/A) MAZE (N/A) MITRAL VALVE REPAIR (MVR) USING MEMO 4D RING SIZE 26MM (N/A) TRICUSPID VALVE REPAIR USING MC3 RING SIZE 28MM (N/A)  1 conts to do well 2 no further vaginal bleeding- H/H are stable- no transfusion for now 3 diarrhea  improved 4 will arrange for HH at time of d/c 5 hemodyn stable, CoOx 67- reduce milrinone today to 0.1and poss d/c this afternoon. Sats good on RA 6 junctional rhythm with adequate rate, some PVC's 7 minor leukocytosis with no fevers- monitor clinically 8 Creat up a little, weight at preop level, cont current lasix for now but poss reduce soon 9 BS ok, on home metformin 10 INR slowly rising- coumadin increase to 3 mg  LOS: 8 days    Diane [name] [facility] 05/17/2018 Pager [phone]  I have seen and examined the patient and agree with the assessment and plan as outlined.  Looks good and feels better.  Anemia stable and no further bleeding.  ECHO looks good.  Wean milrinone off.  Anticipate possible d/c home Monday w/ home health arrangements.  Will discuss f/u plans w/ Dr Terrence Dupont.  Rexene Alberts, MD 05/17/2018 10:20 AM

## 2018-05-17 NOTE — Discharge Summary (Signed)
Physician Discharge Summary  Patient ID: Diane [name] MRN: [id number] DOB/AGE: Jul 22, 1940 77 y.o.  Admit date: 05/02/2018 Discharge date: 05/20/2018  Admission Diagnoses: Severe mitral and tricuspid regurgitation.  Discharge Diagnoses:  Principal Problem:   S/P mitral valve repair + tricuspid valve repair + maze procedure Active Problems:   HYPERTENSION, BENIGN ESSENTIAL   CKD (chronic kidney disease) stage 3, GFR 30-59 ml/min (HCC)   Diabetes mellitus type 2 in obese (HCC)   Longstanding persistent atrial fibrillation   Chronic diastolic heart failure (HCC)   Severe mitral regurgitation   CHF (congestive heart failure) (HCC)   S/P tricuspid valve repair   S/P Maze operation for atrial fibrillation   Acute on chronic diastolic heart failure ([place])   Pulmonary hypertension (HCC)   Tricuspid regurgitation   Patient Active Problem List   Diagnosis Date Noted  . Acute on chronic diastolic heart failure ([place])   . Pulmonary hypertension ([place])   . Tricuspid regurgitation   . S/P mitral valve repair + tricuspid valve repair + maze procedure 05/09/2018  . S/P tricuspid valve repair 05/09/2018  . S/P Maze operation for atrial fibrillation 05/09/2018  . CHF (congestive heart failure) ([place]) 05/08/2018  . Severe mitral regurgitation 05/02/2018  . Chronic diastolic heart failure ([place]) 01/03/2018  . C. difficile colitis 07/03/2017  . CKD (chronic kidney disease) stage 3, GFR 30-59 ml/min (HCC) 07/03/2017  . Diabetes mellitus type 2 in obese ([place]) 07/03/2017  . Longstanding persistent atrial fibrillation   . Hypokalemia 01/15/2014  . Neuropathy due to chemotherapeutic drug ([place]) 03/05/2013  . Swelling of limb 01/23/2013  . Oropharyngeal candidiasis 01/13/2013  . Chemotherapy induced neutropenia ([place]) 01/13/2013  . UTI (urinary tract infection) 12/25/2012  . Cancer of central portion of right female breast ([place]) 09/06/2012  . Vulva cancer ([place]) 08/16/2011  . INSOMNIA 08/26/2010   . HEADACHE 08/26/2010  . TOBACCO USER 11/04/2009  . POSTMENOPAUSAL STATUS 11/04/2009  . DYSLIPIDEMIA 07/30/2009  . HAIR LOSS 07/30/2009  . ANEMIA 07/26/2009  . SINUSITIS, ACUTE 04/02/2009  . DIABETES MELLITUS, TYPE II 01/01/2009  . URINARY TRACT INFECTION SITE NOT SPECIFIED 11/26/2008  . BREAST PAIN, RIGHT 11/12/2008  . ANGIOEDEMA 10/05/2008  . CHEST PAIN, ATYPICAL 09/01/2008  . ALLERGIC RHINITIS 05/05/2008  . VAGINITIS 05/05/2008  . CAROTID BRUIT, RIGHT 11/12/2007  . SHOULDER PAIN, LEFT, CHRONIC 08/09/2007  . 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 DEGENERATIVE DISC DISEASE, LUMBOSACRAL SPINE 08/09/2007  . HYPERTENSION, BENIGN ESSENTIAL 03/20/2007  . BRONCHITIS, ACUTE 03/20/2007  . DEPRESSION 02/27/2007  . MURMUR 02/27/2007   HPI: At time of consultation.  Patient is a 77 year old African-American female with history of mitral regurgitation, persistent atrial fibrillation, hypertension, chronic diastolic congestive heart failure, type 2 diabetes mellitus, hyperlipidemia, iron deficient anemia, arthritis, and depression who has been referred for surgical consultation for management of severe mitral regurgitation.  Patient states that she has had an irregular heart rhythm for many years and more recently she has known of the presence of a heart murmur.  She denies any known history of rheumatic fever in the distant past.  She has been followed by Dr. Terrence Dupont for several years.  She is chronically anticoagulated using Xarelto for atrial fibrillation.  She has a long history of symptoms of shortness of breath which has progressed dramatically over the past year.  She was hospitalized in January 2019 with an exacerbation of shortness of breath coughing and some chills.  Chest x-ray revealed pulmonary vascular congestion with bilateral pleural effusions.  She  was treated with intravenous Lasix and antibiotics for possible pneumonia.  She improved rapidly.  Nuclear stress  test was low risk for ischemia.  Echocardiogram performed at that time revealed normal left ventricular size and systolic function with ejection fraction estimated 55 to 60%.  There was grade 2 diastolic dysfunction.  There was mild aortic insufficiency and what was felt to be moderate mitral regurgitation.  There was mild left atrial enlargement.    Over the past year the patient states that she has developed continued progression of symptoms of exertional shortness of breath.  She has been seen by a pulmonologist on multiple occasions, and PFT's reveal mild to moderate COPD.  She now gets short of breath with low-level activity and occasionally at rest.  She cannot lay flat in bed and typically sleeps on 4 pillows.  She occasionally wakes up in the middle night gasping for breath and her breathing is relieved by sitting straight up.  She has some lower extremity edema, worse on the left side.  She has not had tachypalpitations or dizzy spells.  She denies any chest pain or chest tightness.    The patient underwent left and right heart catheterization earlier today by Dr. Terrence Dupont.  She was found to have mild nonobstructive coronary artery disease.  There was severe pulmonary hypertension with PA pressures measured 47/13 central venous pressure 17.  Pulmonary capillary wedge pressure was not reported.  A transthoracic echocardiogram was ordered and cardiothoracic surgical consultation requested.  Patient is widowed and lives alone locally in [place].  She has 3 adult children and numerous grandchildren and great-grandchildren.  She drives an automobile and remains functionally independent.  She has recently started using a rolling walker when she has to walk distances because she gets tired and short of breath so easily.  She has some mild instability of gait and also uses a cane.  She has recently had 2 mechanical falls.  She states that she is primarily limited by severe exertional shortness of breath.   She can barely walk to the bathroom without stopping to take a breath.  Patient was evaluated by Dr. Roxy Manns including all studies and felt to be a candidate to proceed with surgical intervention.  Discharged Condition: good  Hospital Course:   On 05/09/2018 Ms. Muraski underwent a mitral valve repair, tricuspid valve repair, and maze procedure with atrial appendage clipping by Dr. Roxy Manns.  She tolerated the procedure well was transferred to the surgical ICU.  She was extubated in a timely manner.  Postop day 0 she received 1 unit of packed red blood cells for low hemoglobin.  Her hemodynamics remained stable.  Her chest tube output was improving.  Postop day 1 she was maintaining AAI paced rhythm on milrinone, dopamine, and low-dose neo-.  She is breathing comfortably and her oxygen saturation was 100% on nasal cannula support.  She did have some expected postoperative volume excess and her weight was up about 6 kg.  We discontinued her Swan-Ganz catheter, arterial line, and started her on Coumadin slowly.  We began to wean her milrinone.  Postop day 2 we worked on mobilizing and we initiated a diuretic regimen for fluid overload.  Discontinued her mediastinal chest tubes but left pleural chest tubes due to output.  We inserted a PICC line for prolonged IV therapy.  Postop day 3 she continued to progress was maintaining sinus rhythm off all pressors.  She remained on pacer VVI backup.  We continue to hold her beta-blockers.  Restarted  her RV at a reduced dose.  We initiated a physical therapy consult for debilitation.  Postop day 4 we checked co-ox which was 67.9.  A Lasix drip was initiated and she was restarted on milrinone.  We also performed a PICC line exchange.  Transfer orders were placed for the stepdown unit.  Pillbox remained satisfactory postop day 5.  She remained on low-dose milrinone.  We continue her Coumadin at 2.5 mg daily and her INR continue to increase.  She was weaned off her Lasix drip  and was transitioned to 40 mg Lasix twice daily orally.  She did have some diarrhea likely due to laxatives and we stopped all stool softeners.  We discontinued her epicardial pacing wires.  Postop day 7 she had some vaginal bleeding which was most likely related to the pubic urine collection device.  She did have a history of a total abdominal hysterectomy and vulvar excisions for cancer 6 years ago.  She has been cancer free since then.  Postop day 8 she continued to progress.  We continued her diuretic regimen for fluid overload.  Her H&H remained stable and she had no further vaginal bleeding.  Her echocardiogram looked good and she was weaned off milrinone.  Dr. Terrence Dupont continue to follow along with [place].  Postop day 9 her INR was therapeutic.  She remained in normal sinus rhythm with occasional PVCs.  We decreased her Lasix to 40 mg daily.  Her creatinine and kidney function remained stable.  Postop day 10 we educated her on Coumadin and trying to avoid dark leafy greens.  She did however remain therapeutic.  We added nystatin powder for under her left breast which was increasingly red and odorous.  She continued to ambulate in the halls.  Today, she is tolerating room air, ambulating with limited assistance, her incisions are healing well, and she is ready for discharge home with family.  Consults: cardiology  Significant Diagnostic Studies: angiography: Cardiac catheterization  Treatments: surgery:  CARDIOTHORACIC SURGERY OPERATIVE NOTE  Date of Procedure:                05/09/2018  Preoperative Diagnosis:        Severe Mitral Regurgitation  Long-standing Persistent Atrial Fibrillation  Postoperative Diagnosis:      Severe Mitral Regurgitation  Severe Tricuspid Regurgitation  Long-standing Persistent Atrial Fibrillation   Procedure:       Mitral Valve Repair             Sorin Memo 4D ring annuloplasty (size 64mm, model #4DM-26, serial #[id number])   Tricuspid Valve Repair              Edwards mc3 ring annuloplasty (size 43mm, model #4900, serial #[id number])   Maze Procedure              Complete bilateral atrial lesion set using bipolar radiofrequency and cryothermy ablation             clipping of left atrial appendage (Atricure Pro245 left atrial clip size 73mm)               Surgeon:        Valentina Gu. Roxy Manns, MD  Assistant:       [name], PA-C  Anesthesia:    Laurie Panda, MD  Operative Findings: ? Dilated mitral annulus and non-ischemic cardiomyopathy  ? Type I mitral valve dysfunction with severe mitral regurgitation ? Moderate global left ventricular systolic dysfunction ? Moderate right ventricular systolic dysfunction ? Dilate tricuspid  annulus ? Type I tricuspid valve dysfunction with severe tricuspid regurgitation ? Severe pulmonary hypertension ? Mild to moderate aortic insufficiency ? No residual mitral regurgitation after successful valve repair ? No residual tricuspid regurgitation after successful valve repair   Discharge Exam: Blood pressure (!) 113/57, pulse 73, temperature 98.2 F (36.8 C), temperature source Oral, resp. rate (!) 25, height 5\' 7"  (1.702 m), weight 72.9 kg, SpO2 95 %.   General appearance: alert, cooperative and no distress Heart: regular rate and rhythm and occ extrasystole, no murmur Lungs: clear to auscultation bilaterally Abdomen: benign Extremities: no edema Wound: incis healing well  Disposition: Discharge disposition: 01-Home or Self Care       Discharge Instructions    Discharge patient   Complete by:  As directed    Discharge disposition:  01-Home or Self Care   Discharge patient date:  05/20/2018     Allergies as of 05/20/2018      Reactions   Shellfish Allergy Anaphylaxis      Medication List    STOP taking these medications   dabigatran 75 MG Caps capsule Commonly known as:  PRADAXA   diltiazem 180 MG 24 hr capsule Commonly known as:  CARDIZEM CD   FLUoxetine 10 MG  capsule Commonly known as:  PROZAC   losartan 100 MG tablet Commonly known as:  COZAAR   NITROSTAT 0.4 MG SL tablet Generic drug:  nitroGLYCERIN   potassium chloride 10 MEQ tablet Commonly known as:  K-DUR   traZODone 50 MG tablet Commonly known as:  DESYREL     TAKE these medications   acetaminophen 500 MG tablet Commonly known as:  TYLENOL Take 1,000 mg by mouth every 6 (six) hours as needed for headache (pain).   albuterol 108 (90 Base) MCG/ACT inhaler Commonly known as:  PROVENTIL HFA;VENTOLIN HFA Inhale 2 puffs into the lungs every 4 (four) hours as needed for wheezing or shortness of breath.   aspirin 81 MG EC tablet Take 1 tablet (81 mg total) by mouth daily.   atorvastatin 20 MG tablet Commonly known as:  LIPITOR Take 20 mg by mouth daily.   furosemide 40 MG tablet Commonly known as:  LASIX Take 1 tablet (40 mg total) by mouth daily.   metFORMIN 500 MG 24 hr tablet Commonly known as:  GLUCOPHAGE-XR Take 1 tablet (500 mg total) by mouth daily with breakfast. Total dose of 1000mg  daily What changed:    when to take this  additional instructions   ONETOUCH VERIO test strip Generic drug:  glucose blood FOR USE WHEN CHECKING BLOOD GLUCOSE ONCE DAILY ALTERNATING AM AND PM BEFORE MEALS FINGER STICK 90 DAYS   potassium chloride SA 20 MEQ tablet Commonly known as:  K-DUR,KLOR-CON Take 1 tablet (20 mEq total) by mouth daily.   spironolactone 25 MG tablet Commonly known as:  ALDACTONE Take 1 tablet (25 mg total) by mouth daily.   traMADol 50 MG tablet Commonly known as:  ULTRAM Take 1-2 tablets (50-100 mg total) by mouth every 6 (six) hours as needed for moderate pain.   warfarin 2 MG tablet Commonly known as:  COUMADIN Take 1 tablet (2 mg total) by mouth daily at 6 PM.      Follow-up Information    [name], MD Follow up.   Specialty:  Cardiology Why:  Appointment to see Dr. Terrence Dupont on 05/23/2018.  The appointment is at 1:30 PM.  They will  also do blood work to adjust your Coumadin dose. Contact information: [address] [place] [address] [phone]        Rexene Alberts, MD Follow up.   Specialty:  Cardiothoracic Surgery Why:  Please see discharge paperwork for follow-up appointment with the surgeon.  Is also obtain a chest x-ray at [facility] 1/2-hour prior to this appointment.  [facility] is located in the same office complex on the first floor. Contact information: [address] [place] [address] [phone]        [facility] Follow up.   Specialty:  [place] Why:  HHRN/PT/aide arranged- they will call you to set up home visits Contact information: [address] [place] [address] [phone]          The patient has been discharged on:   1.Beta Blocker:  Yes [   ]                              No   [ no  ]                              If No, reason: hx of bradycardia  2.Ace Inhibitor/ARB: Yes [   ]                                     No  [n    ]                                     If No, reason:creat rising, soft BP  3.Statin:   Yes [ yes  ]                  No  [   ]                  If No, reason:  4.[place]:  Yes  [ yes  ]                  No   [   ]                  If No, reason:  Signed: [name] 05/20/2018, 7:40 AM

## 2018-05-17 NOTE — Progress Notes (Signed)
CARDIAC REHAB PHASE I   PRE:  Rate/Rhythm: 84 JR, PVC  BP:  Sitting: 121/61      SaO2: 88-91% RA  MODE:  Ambulation: 370 ft   POST:  Rate/Rhythm: 87 JR, PVC  BP:  Sitting: 127/62      SaO2: 92% RA  Pt ambulated better today. Pt stated SOB and seated rest X4. Rollator with one assist with gait belt. Pt need instruction on how to use and sit on rollator while walking. SPO2 was low while walking. Pursed lip breathing was encouraged and SPO2 increased. IS use was encouraged. Pt to chair with call bell and phone within reach. Encouraged Pt to walk 3x per day.   [place], ACSM CEP  2:49 PM 05/17/2018

## 2018-05-18 LAB — GLUCOSE, CAPILLARY
Glucose-Capillary: 92 mg/dL (ref 70–99)
Glucose-Capillary: 99 mg/dL (ref 70–99)
Glucose-Capillary: 104 mg/dL — ABNORMAL HIGH (ref 70–99)
Glucose-Capillary: 88 mg/dL (ref 70–99)

## 2018-05-18 LAB — PROTIME-INR
INR: 2.03
Prothrombin Time: 22.7 seconds — ABNORMAL HIGH (ref 11.4–15.2)

## 2018-05-18 MED ORDER — FUROSEMIDE 40 MG PO TABS
40.0000 mg | ORAL_TABLET | Freq: Every day | ORAL | Status: DC
  Administered 2018-05-18 – 2018-05-20 (×3): 40 mg via ORAL
  Filled 2018-05-18 (×2): qty 1

## 2018-05-18 MED ORDER — POTASSIUM CHLORIDE CRYS ER 20 MEQ PO TBCR
20.0000 meq | EXTENDED_RELEASE_TABLET | Freq: Every day | ORAL | Status: DC
  Administered 2018-05-18 – 2018-05-20 (×3): 20 meq via ORAL
  Filled 2018-05-18 (×3): qty 1

## 2018-05-18 MED ORDER — LOSARTAN POTASSIUM 25 MG PO TABS
25.0000 mg | ORAL_TABLET | Freq: Every day | ORAL | Status: DC
  Administered 2018-05-18 – 2018-05-19 (×2): 25 mg via ORAL
  Filled 2018-05-18 (×2): qty 1

## 2018-05-18 NOTE — Progress Notes (Signed)
Subjective:  Denies any chest pain or shortness of breath complains of musculoskeletal back pain.  Overall feels good.  Junctional rhythm with occasional PVCs and couplets on the monitor  Objective:  Vital Signs in the last 24 hours: Temp:  [97.2 F (36.2 C)-98.6 F (37 C)] 97.9 F (36.6 C) (12/21 0357) Pulse Rate:  [69-87] 87 (12/21 0357) Resp:  [17-22] 21 (12/21 0357) BP: (109-144)/(57-99) 144/99 (12/21 0357) SpO2:  [88 %-92 %] 91 % (12/21 0357) Weight:  [83.5 kg] 83.5 kg (12/21 0357)  Intake/Output from previous day: 12/20 0701 - 12/21 0700 In: 120 [P.O.:120] Out: -  Intake/Output from this shift: No intake/output data recorded.  Physical Exam: Neck: no adenopathy, no carotid bruit, no JVD and supple, symmetrical, trachea midline Lungs: clear to auscultation bilaterally Heart: regular rate and rhythm and S1, S2 normal Abdomen: soft, non-tender; bowel sounds normal; no masses,  no organomegaly Extremities: extremities normal, atraumatic, no cyanosis or edema  Lab Results: Recent Labs    05/16/18 0419 05/17/18 0422  WBC 9.7 11.0*  HGB 7.8* 7.9*  PLT 201 241   Recent Labs    05/16/18 0419 05/17/18 0422  NA 136 136  K 3.9 4.0  CL 98 98  CO2 27 26  GLUCOSE 105* 112*  BUN 9 7*  CREATININE 1.18* 1.33*   No results for input(s): TROPONINI in the last 72 hours.  Invalid input(s): CK, MB Hepatic Function Panel No results for input(s): PROT, ALBUMIN, AST, ALT, ALKPHOS, BILITOT, BILIDIR, IBILI in the last 72 hours. No results for input(s): CHOL in the last 72 hours. No results for input(s): PROTIME in the last 72 hours.  Imaging: Imaging results have been reviewed and No results found.  Cardiac Studies:  Assessment/Plan:  Severe symptomatic mitral regurgitation/tricuspid regurgitation status post MVR/TVR and annuloplasty/Maze procedure and left atrial appendage clippingpostop day8, doing well Mild volume overloadimproved. Postop atelectasis Pulmonary  hypertension. 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 Hypertension. Diabetes mellitus. Hyperlipidemia. Osteoarthritis Diarrhea secondary to laxatives Status post questionable vaginal bleeding Plan Agree with reducing dose of Lasix Add losartan 25 mg daily Monitor renal function Encouraged ambulation  LOS: 9 days    Diane [name] 05/18/2018, 10:39 AM

## 2018-05-18 NOTE — Plan of Care (Signed)
Care plans reviewed and patient is progressing.  

## 2018-05-18 NOTE — Progress Notes (Addendum)
[address]       [place] [address]             [phone]      9 Days Post-Op Procedure(s) (LRB): TRANSESOPHAGEAL ECHOCARDIOGRAM (TEE) (N/A) CLIPPING OF ATRIAL APPENDAGE USING ATRICLIP PRO2 CLIP SIZE 45MM (N/A) MAZE (N/A) MITRAL VALVE REPAIR (MVR) USING MEMO 4D RING SIZE 26MM (N/A) TRICUSPID VALVE REPAIR USING MC3 RING SIZE 28MM (N/A) Subjective: Feels okay today. Back in hurting and feels like when she walks she is carrying around a ball and chain.   Objective: Vital signs in last 24 hours: Temp:  [97.2 F (36.2 C)-98.6 F (37 C)] 97.9 F (36.6 C) (12/21 0357) Pulse Rate:  [69-87] 87 (12/21 0357) Cardiac Rhythm: Normal sinus rhythm (12/20 1900) Resp:  [17-22] 21 (12/21 0357) BP: (109-144)/(57-99) 144/99 (12/21 0357) SpO2:  [88 %-92 %] 91 % (12/21 0357) Weight:  [83.5 kg] 83.5 kg (12/21 0357)     Intake/Output from previous day: 12/20 0701 - 12/21 0700 In: 120 [P.O.:120] Out: -  Intake/Output this shift: No intake/output data recorded.  General appearance: alert, cooperative and no distress Heart: regular rate and rhythm, S1, S2 normal, no murmur, click, rub or gallop Lungs: clear to auscultation bilaterally Abdomen: soft, non-tender; bowel sounds normal; no masses,  no organomegaly Extremities: extremities normal, atraumatic, no cyanosis or edema Wound: clean and dry  Lab Results: Recent Labs    05/16/18 0419 05/17/18 0422  WBC 9.7 11.0*  HGB 7.8* 7.9*  HCT 24.7* 24.4*  PLT 201 241   BMET:  Recent Labs    05/16/18 0419 05/17/18 0422  NA 136 136  K 3.9 4.0  CL 98 98  CO2 27 26  GLUCOSE 105* 112*  BUN 9 7*  CREATININE 1.18* 1.33*  CALCIUM 7.9* 8.1*    PT/INR:  Recent Labs    05/18/18 0600  LABPROT 22.7*  INR 2.03   ABG    Component Value Date/Time   PHART 7.367 05/10/2018 2308   HCO3 17.7 (L) 05/10/2018 2308   TCO2 19 (L) 05/10/2018 2308   ACIDBASEDEF 7.0 (H) 05/10/2018 2308   O2SAT 67.9 05/17/2018 0455    CBG (last 3)  Recent Labs    05/17/18 1625 05/17/18 2116 05/18/18 0604  GLUCAP 87 113* 92    Assessment/Plan: S/P Procedure(s) (LRB): TRANSESOPHAGEAL ECHOCARDIOGRAM (TEE) (N/A) CLIPPING OF ATRIAL APPENDAGE USING ATRICLIP PRO2 CLIP SIZE 45MM (N/A) MAZE (N/A) MITRAL VALVE REPAIR (MVR) USING MEMO 4D RING SIZE 26MM (N/A) TRICUSPID VALVE REPAIR USING MC3 RING SIZE 28MM (N/A)  1. CV-NSR in the 80s, BP well controlled. On ASA, statin, and couamdin recently increased to 3mg . INR is now 2.03.  2. Pulm-tolerating room air but marginal oxygen sats. Encouraged incentive spirometer.  3. Renal-creatinine 1.33 today, slightly up from yesterday. Continue diuresis and watch closely.  4. H and H  7.9/24.4, acute blood loss anemia. Stable.  5. Endo-CBGs are 87/113/92, well controlled. Continue Metformin and SSI. 6. No further vaginal bleeding. H and H stable.  7. Moisture under her breast-applied a dry pad-will monitor   Plan: INR is now therapeutic. Continue ambulation today and work on mobility. Continue diuresis and watch creatinine-will decrease to Lasix 40mg  daily.  Incisions is healing well. Plan for d/c Monday if continued to progress.    LOS: 9 days    Diane [name] 05/18/2018  hgb stable When I went to see the patient se had been served  a LARGE salad  of mixed greens - on coumadin . The issue of salad and coumadin  I have seen and examined Diane [name] and agree with the above assessment  and plan.  Diane Isaac MD [facility] [phone] Office [phone] 05/18/2018 12:16 PM

## 2018-05-18 NOTE — Progress Notes (Signed)
Nutrition Education Note  RD consulted regarding concerns over excessive dietary Vit K intake in setting of new warfarin use.   Pt apparently had ordered large salad today for lunch.  Spoke with patient regarding how warfarin affects the body and how her oral diet can impact its efficacy. Specifically touched on Vit K foods and how large changes in vitamin K intake can lead to changes in INR, increasing her risk for adverse bleeding or clotting. Explained her warfarin dose will be based on her INR.  She notes she is an avid consumer of leafy greens. She enjoys collards, Kale and spinach, both cooked and raw. She says these are items she grew up on and does not want to give them up. RD explained it is more about being consistent with her vitamin K intake; she does not need to avoid in entirely. Provided handout on Vit K-containing foods and the amount of the vitamin they contain. Noted Vit K discrepancy between cooked & raw foods. Discussed ways to ensure consistency, such as by eating a half cup of cooked greens with dinner/lunch each day or by always having a side salad with lunch and then a portion of cooked greens at dinner.   She feels she will have no trouble being consistent with intake.    Burtis Junes RD, LDN, CNSC Clinical Nutrition Available Tues-Sat via Pager: [phone] 05/18/2018 7:47 PM

## 2018-05-18 NOTE — Progress Notes (Signed)
CARDIAC REHAB PHASE I   Offered to walk with pt, pt states she needs "time". Rollator left in room and pt encouraged to get in three walks outside of her room today.   Rufina Falco, RN BSN 05/18/2018 1:08 PM

## 2018-05-19 LAB — CBC
HCT: 27.4 % — ABNORMAL LOW (ref 36.0–46.0)
Hemoglobin: 8.6 g/dL — ABNORMAL LOW (ref 12.0–15.0)
MCH: 26 pg (ref 26.0–34.0)
MCHC: 31.4 g/dL (ref 30.0–36.0)
MCV: 82.8 fL (ref 80.0–100.0)
Platelets: 320 10*3/uL (ref 150–400)
RBC: 3.31 MIL/uL — ABNORMAL LOW (ref 3.87–5.11)
RDW: 17 % — ABNORMAL HIGH (ref 11.5–15.5)
WBC: 10 10*3/uL (ref 4.0–10.5)
nRBC: 0 % (ref 0.0–0.2)

## 2018-05-19 LAB — BASIC METABOLIC PANEL
Anion gap: 10 (ref 5–15)
BUN: 7 mg/dL — ABNORMAL LOW (ref 8–23)
CO2: 27 mmol/L (ref 22–32)
Calcium: 8.2 mg/dL — ABNORMAL LOW (ref 8.9–10.3)
Chloride: 98 mmol/L (ref 98–111)
Creatinine, Ser: 1.32 mg/dL — ABNORMAL HIGH (ref 0.44–1.00)
GFR calc Af Amer: 46 mL/min — ABNORMAL LOW (ref >60)
GFR calc non Af Amer: 40 mL/min — ABNORMAL LOW (ref >60)
Glucose, Bld: 99 mg/dL (ref 70–99)
Potassium: 4.2 mmol/L (ref 3.5–5.1)
Sodium: 135 mmol/L (ref 135–145)

## 2018-05-19 LAB — PROTIME-INR
INR: 2.18
Prothrombin Time: 24 seconds — ABNORMAL HIGH (ref 11.4–15.2)

## 2018-05-19 LAB — GLUCOSE, CAPILLARY
Glucose-Capillary: 94 mg/dL (ref 70–99)
Glucose-Capillary: 81 mg/dL (ref 70–99)
Glucose-Capillary: 86 mg/dL (ref 70–99)
Glucose-Capillary: 88 mg/dL (ref 70–99)

## 2018-05-19 MED ORDER — NYSTATIN 100000 UNIT/GM EX POWD
Freq: Two times a day (BID) | CUTANEOUS | Status: DC
  Administered 2018-05-19 – 2018-05-20 (×2): via TOPICAL
  Filled 2018-05-19: qty 15

## 2018-05-19 NOTE — Progress Notes (Signed)
Subjective:  Doing well denies any chest pain or shortness of breath.  No significant arrhythmias on the monitor artifacts noted  Objective:  Vital Signs in the last 24 hours: Temp:  [97.6 F (36.4 C)-98.4 F (36.9 C)] 98.1 F (36.7 C) (12/22 0913) Pulse Rate:  [63-72] 65 (12/22 1241) Resp:  [13-25] 21 (12/22 0913) BP: (108-139)/(30-77) 139/52 (12/22 1241) SpO2:  [92 %-97 %] 95 % (12/22 1241) Weight:  [78.7 kg] 78.7 kg (12/22 0609)  Intake/Output from previous day: 12/21 0701 - 12/22 0700 In: 1560 [P.O.:1560] Out: 0277 [Urine:1751; Stool:1] Intake/Output from this shift: No intake/output data recorded.  Physical Exam: Neck: no adenopathy, no carotid bruit, no JVD and supple, symmetrical, trachea midline Lungs: clear to auscultation bilaterally Heart: regular rate and rhythm and S1, S2 normal Abdomen: soft, non-tender; bowel sounds normal; no masses,  no organomegaly Extremities: extremities normal, atraumatic, no cyanosis or edema  Lab Results: Recent Labs    05/17/18 0422 05/19/18 0737  WBC 11.0* 10.0  HGB 7.9* 8.6*  PLT 241 320   Recent Labs    05/17/18 0422 05/19/18 0737  NA 136 135  K 4.0 4.2  CL 98 98  CO2 26 27  GLUCOSE 112* 99  BUN 7* 7*  CREATININE 1.33* 1.32*   No results for input(s): TROPONINI in the last 72 hours.  Invalid input(s): CK, MB Hepatic Function Panel No results for input(s): PROT, ALBUMIN, AST, ALT, ALKPHOS, BILITOT, BILIDIR, IBILI in the last 72 hours. No results for input(s): CHOL in the last 72 hours. No results for input(s): PROTIME in the last 72 hours.  Imaging: Imaging results have been reviewed and No results found.  Cardiac Studies:  Assessment/Plan:  Severe symptomatic mitral regurgitation/tricuspid regurgitation status post MVR/TVR and annuloplasty/Maze procedure and left atrial appendage clippingpostop day8, doing well Postop atelectasis Pulmonary hypertension. 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 Hypertension. Diabetes mellitus. Hyperlipidemia. Osteoarthritis Plan Continue present management Increase ambulation Encouraged incentive spirometry  LOS: 10 days    [name] 05/19/2018, 12:57 PM

## 2018-05-19 NOTE — Progress Notes (Addendum)
      [address]       [place] [address]             [phone]      10 Days Post-Op Procedure(s) (LRB): TRANSESOPHAGEAL ECHOCARDIOGRAM (TEE) (N/A) CLIPPING OF ATRIAL APPENDAGE USING ATRICLIP PRO2 CLIP SIZE 45MM (N/A) MAZE (N/A) MITRAL VALVE REPAIR (MVR) USING MEMO 4D RING SIZE 26MM (N/A) TRICUSPID VALVE REPAIR USING MC3 RING SIZE 28MM (N/A) Subjective: She has some leg pain last night and her leg was "jumping". Right groin cannulation site is c/d/i  Objective: Vital signs in last 24 hours: Temp:  [97.6 F (36.4 C)-98.4 F (36.9 C)] 98.4 F (36.9 C) (12/22 0609) Pulse Rate:  [63-72] 63 (12/22 0609) Cardiac Rhythm: Normal sinus rhythm (12/21 1900) Resp:  [13-25] 16 (12/22 0609) BP: (108-158)/(30-74) 130/74 (12/22 0609) SpO2:  [92 %-97 %] 92 % (12/22 0609) Weight:  [78.7 kg] 78.7 kg (12/22 0609)     Intake/Output from previous day: 12/21 0701 - 12/22 0700 In: 1560 [P.O.:1560] Out: 2458 [Urine:1751; Stool:1] Intake/Output this shift: No intake/output data recorded.  General appearance: alert, cooperative and no distress Heart: regular rate and rhythm, S1, S2 normal, no murmur, click, rub or gallop Lungs: clear to auscultation bilaterally Abdomen: soft, non-tender; bowel sounds normal; no masses,  no organomegaly Extremities: extremities normal, atraumatic, no cyanosis or edema Wound: clean and dry  Lab Results: Recent Labs    05/17/18 0422 05/19/18 0737  WBC 11.0* 10.0  HGB 7.9* 8.6*  HCT 24.4* 27.4*  PLT 241 320   BMET:  Recent Labs    05/17/18 0422 05/19/18 0737  NA 136 135  K 4.0 4.2  CL 98 98  CO2 26 27  GLUCOSE 112* 99  BUN 7* 7*  CREATININE 1.33* 1.32*  CALCIUM 8.1* 8.2*    PT/INR:  Recent Labs    05/19/18 0737  LABPROT 24.0*  INR 2.18   ABG    Component Value Date/Time   PHART 7.367 05/10/2018 2308   HCO3 17.7 (L) 05/10/2018 2308   TCO2 19 (L) 05/10/2018 2308   ACIDBASEDEF 7.0 (H) 05/10/2018 2308   O2SAT 67.9  05/17/2018 0455   CBG (last 3)  Recent Labs    05/18/18 1633 05/18/18 2133 05/19/18 0607  GLUCAP 104* 88 94    Assessment/Plan: S/P Procedure(s) (LRB): TRANSESOPHAGEAL ECHOCARDIOGRAM (TEE) (N/A) CLIPPING OF ATRIAL APPENDAGE USING ATRICLIP PRO2 CLIP SIZE 45MM (N/A) MAZE (N/A) MITRAL VALVE REPAIR (MVR) USING MEMO 4D RING SIZE 26MM (N/A) TRICUSPID VALVE REPAIR USING MC3 RING SIZE 28MM (N/A)   1. CV-NSR in the 70s-80s, BP well controlled. On ASA, statin, Losartan and couamdin recently increased to 3mg . INR is now 2.18.  2. Pulm-tolerating room air but marginal oxygen sats. Encouraged incentive spirometer.  3. Renal-creatinine 1.32 today, stable. Continue diuresis and watch closely.  4. H and H  8.6/27.4, acute blood loss anemia. Stable and improving 5. Endo-CBGs are 104/88/94, well controlled. Continue Metformin and SSI. 6. No further vaginal bleeding. H and H stable.  7. Moisture under her breast-will order nystatin powder. More red today with an odor.  8. Musculoskeletal pain-ordered kpad  Plan: Education on coumadin and dark leafy greens. She plans to ambulate today. Continue incentive spirometer. Renal function stable.    LOS: 10 days    Elgie Collard 05/19/2018  Pos home in am Eating fruit salad today

## 2018-05-19 NOTE — Progress Notes (Signed)
Encouraged ambulation, patient declines at this time. Will monitor patient. Cloer, Bettina Gavia RN

## 2018-05-20 LAB — BASIC METABOLIC PANEL
Anion gap: 8 (ref 5–15)
BUN: 9 mg/dL (ref 8–23)
CO2: 26 mmol/L (ref 22–32)
Calcium: 8 mg/dL — ABNORMAL LOW (ref 8.9–10.3)
Chloride: 98 mmol/L (ref 98–111)
Creatinine, Ser: 1.49 mg/dL — ABNORMAL HIGH (ref 0.44–1.00)
GFR calc Af Amer: 40 mL/min — ABNORMAL LOW (ref >60)
GFR calc non Af Amer: 34 mL/min — ABNORMAL LOW (ref >60)
Glucose, Bld: 89 mg/dL (ref 70–99)
Potassium: 4.1 mmol/L (ref 3.5–5.1)
Sodium: 132 mmol/L — ABNORMAL LOW (ref 135–145)

## 2018-05-20 LAB — GLUCOSE, CAPILLARY
Glucose-Capillary: 89 mg/dL (ref 70–99)
Glucose-Capillary: 92 mg/dL (ref 70–99)

## 2018-05-20 LAB — CBC
HCT: 26.4 % — ABNORMAL LOW (ref 36.0–46.0)
Hemoglobin: 8.3 g/dL — ABNORMAL LOW (ref 12.0–15.0)
MCH: 26.3 pg (ref 26.0–34.0)
MCHC: 31.4 g/dL (ref 30.0–36.0)
MCV: 83.5 fL (ref 80.0–100.0)
Platelets: 347 10*3/uL (ref 150–400)
RBC: 3.16 MIL/uL — ABNORMAL LOW (ref 3.87–5.11)
RDW: 17.1 % — ABNORMAL HIGH (ref 11.5–15.5)
WBC: 9.6 10*3/uL (ref 4.0–10.5)
nRBC: 0 % (ref 0.0–0.2)

## 2018-05-20 LAB — PROTIME-INR
INR: 2.39
Prothrombin Time: 25.7 seconds — ABNORMAL HIGH (ref 11.4–15.2)

## 2018-05-20 MED ORDER — ASPIRIN 81 MG PO TBEC: 81.0000 mg | DELAYED_RELEASE_TABLET | Freq: Every day | ORAL | Status: DC

## 2018-05-20 MED ORDER — POTASSIUM CHLORIDE CRYS ER 20 MEQ PO TBCR: 20.0000 meq | EXTENDED_RELEASE_TABLET | Freq: Every day | ORAL | 1 refills | Status: DC

## 2018-05-20 MED ORDER — SPIRONOLACTONE 25 MG PO TABS: 25.0000 mg | ORAL_TABLET | Freq: Every day | ORAL | 1 refills | Status: DC

## 2018-05-20 MED ORDER — WARFARIN SODIUM 2 MG PO TABS: 2.0000 mg | ORAL_TABLET | Freq: Every day | ORAL | 1 refills | Status: DC

## 2018-05-20 MED ORDER — TRAMADOL HCL 50 MG PO TABS: 50.0000 mg | ORAL_TABLET | Freq: Four times a day (QID) | ORAL | 0 refills | Status: DC | PRN

## 2018-05-20 MED ORDER — FUROSEMIDE 40 MG PO TABS: 40.0000 mg | ORAL_TABLET | Freq: Every day | ORAL | 1 refills | Status: AC

## 2018-05-20 MED FILL — SPIRONOLACTONE 25 MG TABLET: 25 | 30 days supply | Qty: 30 | Fill #0

## 2018-05-20 MED FILL — WARFARIN SODIUM 2 MG TABLET: 2 | 30 days supply | Qty: 60 | Fill #0

## 2018-05-20 MED FILL — POTASSIUM CL ER 20 MEQ TABL: 20 | 30 days supply | Qty: 30 | Fill #0

## 2018-05-20 MED FILL — traMADol HCL 50 MG TABS: 50 | 4 days supply | Qty: 30 | Fill #0

## 2018-05-20 NOTE — Care Management Note (Signed)
Case Management Note Initial CM note completed by Midge Minium RN, BSN, NCM-BC, ACM-RN [phone] 05/14/2018, 11:56 AM  Patient Details  Name: Diane [name] MRN: [id number] Date of Birth: 1941/03/25  Subjective/Objective:  77 yo female presented for CP; s/p MVR, MAZE                Action/Plan: CM following for dispositional needs and recommendations for HHPT. Patient sleeping at this time; CM team will continue following to arrange services.   Expected Discharge Date:  05/20/18               Expected Discharge Plan:  [place]  In-House Referral:  NA  Discharge planning Services  CM Consult  Post Acute Care Choice:  Home Health Choice offered to:  Patient  DME Arranged:  N/A DME Agency:  NA  HH Arranged:  RN, PT, Nurse's Aide [place] Agency:  [facility]  Status of Service:  Completed, signed off  If discussed at [place] of Stay Meetings, dates discussed:    Discharge Disposition: home/home health   Additional Comments:  05/20/18- 1125- [name] RN, CM- pt for transition home today- referral previously given to [facility] per pt choice- no further CM needs noted for transition home.   05/17/18- 54- [name] RN, CM- follow up done with pt for transition of care needs- orders have been placed for HHRN/PT/aide- spoke with pt at bedside- list provided per CMS website- for [facility] agency choice- per pt she would like to use [facility] for services- states her granddaughter will be staying with her and her daughter also checking in on her. She reports that she has walker, canes, and elevated toilet seat at home. Referral called to Butch Penny with [facility] - referral accepted.   05/15/18 @ 1615-[name] RNCM-CM met with patient to discuss dispositional needs. Patient states living at home alone, independent with ADLs, utilizing a RW and cane to assist with ambulation; BSC/tub bench available. PCP verified as: Dr. Lajean Manes; pharmacy of choice: [facility].  CM discussed PT recommendations for HHPT with 24hr assistance (CMS HH compare list provided) vs ST SNF if unable to arrange family assistance. Patient would like to discuss dispositional plan with her granddaughter [name] later today and will consider ST SNF if she's unable to arrange assistance. CM team will f/u.  Midge Minium RN, BSN, NCM-BC, ACM-RN [phone] 05/20/2018, 11:23 AM

## 2018-05-20 NOTE — Progress Notes (Signed)
Physical Therapy Treatment Patient Details Name: Diane [name] MRN: [id number] DOB: 07-09-1940 Today's Date: 05/20/2018    History of Present Illness Patient is a 77 yo female status post MVR/TVR and annuloplasty/Maze procedure and left atrial appendage clipping. History of severe symptomatic mitral regurgitation. Pulmonary hypertension. Nonobstructive CAD. Acute onChronic kidney disease stage2/3, anemia, HTN, DM, HLD, OA    PT Comments    Continuing work on functional mobility and activity tolerance;  Overall progressing well from a functional mobility standpoint, and OK for dc home; She still needs reinforcement of sternal precautions (placing her remote on the bed instead of in her lap when in recliner); Discussed with Orvil Feil, RN, who will reinforce precautions as well  Follow Up Recommendations  Home health PT;Supervision/Assistance - 24 hour     Equipment Recommendations  None recommended by PT    Recommendations for Other Services       Precautions / Restrictions Precautions Precautions: Sternal Restrictions RUE Weight Bearing: (R mastectomy, 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 pricks BP)    Mobility  Bed Mobility Overal bed mobility: Modified Independent                Transfers Overall transfer level: Needs assistance Equipment used: None Transfers: Sit to/from Stand Sit to Stand: Supervision         General transfer comment: cues for hand placement for maintaining sternal precautions   Ambulation/Gait Ambulation/Gait assistance: Supervision;Min guard Gait Distance (Feet): 175 Feet(x2) Assistive device: None Gait Pattern/deviations: Step-through pattern;Decreased stride length Gait velocity: decreased   General Gait Details: Cues to self-monitor for activity tolerance; a few standing rest breaks, and noted 2-3/4 DOE; one lengthy seated rest break before returning to room; took time to discuss that the RW or rollator can make th ework of walking less; she declined using  RW   Stairs             Wheelchair Mobility    Modified Rankin (Stroke Patients Only)       Balance Overall balance assessment: Mild deficits observed, not formally tested                                          Cognition Arousal/Alertness: Awake/alert Behavior During Therapy: WFL for tasks assessed/performed Overall Cognitive Status: Within Functional Limits for tasks assessed                                        Exercises      General Comments        Pertinent Vitals/Pain Pain Assessment: No/denies pain    Home Living                      Prior Function            PT Goals (current goals can now be found in the care plan section) Acute Rehab PT Goals Patient Stated Goal: to go home PT Goal Formulation: With patient Time For Goal Achievement: 05/27/18 Potential to Achieve Goals: Good Progress towards PT goals: Progressing toward goals    Frequency    Min 3X/week      PT Plan Current plan remains appropriate    Co-evaluation              AM-PAC PT "6 Clicks" Mobility   Outcome Measure  Help needed turning from your back to your side while in a flat bed without using bedrails?: None Help needed moving from lying on your back to sitting on the side of a flat bed without using bedrails?: None Help needed moving to and from a bed to a chair (including a wheelchair)?: None Help needed standing up from a chair using your arms (e.g., wheelchair or bedside chair)?: A Little Help needed to walk in hospital room?: A Little Help needed climbing 3-5 steps with a railing? : A Lot 6 Click Score: 20    End of Session   Activity Tolerance: Patient tolerated treatment well Patient left: in chair;with call bell/phone within reach Nurse Communication: Mobility status;Other (comment)(and to please reinforce sternal prec) PT Visit Diagnosis: Difficulty in walking, not elsewhere classified (R26.2)      Time: 0141-0301 PT Time Calculation (min) (ACUTE ONLY): 19 min  Charges:  $Gait Training: 8-22 mins                     Roney Marion, [place]  Acute Rehabilitation Services Pager [phone] Office [phone]'   Colletta [place] 05/20/2018, 12:36 PM

## 2018-05-20 NOTE — Progress Notes (Addendum)
[address]       [place] [address]             [phone]      11 Days Post-Op Procedure(s) (LRB): TRANSESOPHAGEAL ECHOCARDIOGRAM (TEE) (N/A) CLIPPING OF ATRIAL APPENDAGE USING ATRICLIP PRO2 CLIP SIZE 45MM (N/A) MAZE (N/A) MITRAL VALVE REPAIR (MVR) USING MEMO 4D RING SIZE 26MM (N/A) TRICUSPID VALVE REPAIR USING MC3 RING SIZE 28MM (N/A) Subjective: Feels well, no new C/O  Objective: Vital signs in last 24 hours: Temp:  [98.1 F (36.7 C)-99.2 F (37.3 C)] 98.2 F (36.8 C) (12/23 0441) Pulse Rate:  [65-73] 73 (12/23 0441) Cardiac Rhythm: Normal sinus rhythm (12/23 0700) Resp:  [14-25] 25 (12/23 0441) BP: (99-139)/(52-83) 113/57 (12/23 0441) SpO2:  [92 %-96 %] 95 % (12/23 0441) Weight:  [72.9 kg] 72.9 kg (12/23 0441)  Hemodynamic parameters for last 24 hours:    Intake/Output from previous day: 12/22 0701 - 12/23 0700 In: 1200 [P.O.:1200] Out: -  Intake/Output this shift: No intake/output data recorded.  General appearance: alert, cooperative and no distress Heart: regular rate and rhythm and occ extrasystole, no murmur Lungs: clear to auscultation bilaterally Abdomen: benign Extremities: no edema Wound: incis healing well  Lab Results: Recent Labs    05/19/18 0737 05/20/18 0525  WBC 10.0 9.6  HGB 8.6* 8.3*  HCT 27.4* 26.4*  PLT 320 347   BMET:  Recent Labs    05/19/18 0737 05/20/18 0525  NA 135 132*  K 4.2 4.1  CL 98 98  CO2 27 26  GLUCOSE 99 89  BUN 7* 9  CREATININE 1.32* 1.49*  CALCIUM 8.2* 8.0*    PT/INR:  Recent Labs    05/20/18 0525  LABPROT 25.7*  INR 2.39   ABG    Component Value Date/Time   PHART 7.367 05/10/2018 2308   HCO3 17.7 (L) 05/10/2018 2308   TCO2 19 (L) 05/10/2018 2308   ACIDBASEDEF 7.0 (H) 05/10/2018 2308   O2SAT 67.9 05/17/2018 0455   CBG (last 3)  Recent Labs    05/19/18 1629 05/19/18 2212 05/20/18 0637  GLUCAP 86 88 89    Meds Scheduled Meds: . aspirin EC  81 mg Oral Daily    . atorvastatin  20 mg Oral Daily  . bisacodyl  10 mg Rectal Daily  . Chlorhexidine Gluconate Cloth  6 each Topical Daily  . ferrous [id number] C-folic acid  1 capsule Oral TID PC  . furosemide  40 mg Oral Daily  . insulin aspart  0-15 Units Subcutaneous TID WC  . losartan  25 mg Oral Daily  . mouth rinse  15 mL Mouth Rinse BID  . metFORMIN  500 mg Oral Q breakfast  . nystatin   Topical BID  . pantoprazole  40 mg Oral Daily  . potassium chloride  20 mEq Oral Daily  . sodium chloride flush  10 mL Intravenous Q12H  . spironolactone  25 mg Oral Daily  . warfarin  3 mg Oral q1800  . Warfarin - Physician Dosing Inpatient   Does not apply q1800   Continuous Infusions: . sodium chloride Stopped (05/14/18 1311)   PRN Meds:.metoprolol tartrate, morphine injection, ondansetron (ZOFRAN) IV, sodium chloride flush, sodium chloride flush, traMADol  Xrays No results found.  Assessment/Plan: S/P Procedure(s) (LRB): TRANSESOPHAGEAL ECHOCARDIOGRAM (TEE) (N/A) CLIPPING OF ATRIAL APPENDAGE USING ATRICLIP PRO2 CLIP SIZE 45MM (N/A) MAZE (N/A) MITRAL VALVE REPAIR (MVR) USING MEMO 4D RING SIZE 26MM (N/A) TRICUSPID VALVE REPAIR USING MC3 RING  SIZE 28MM (N/A)  1 conts to do well 2 hemodyn stable , junct rhythm with good rate, + PVC's 3 creat upto 1.49, will stop Cozaar, BP well controlled to somewhat soft 4 cont current diuretics, lasix/spiro for now 5 coumadin 2 mg at d/c- appt with Dr Terrence Dupont later this week 6 sats good on RA 7 rehab going well, back pain improved 8 other labs stable, BS well controlled 9 d/c home today  LOS: 11 days    [name] [facility] 05/20/2018 Pager [phone]    Chart reviewed, patient examined, agree with above. She feels well and wants to go home.  Rhythm is stable. Ambulating well. INR 2.4. Will have INR checked later this week. Plan home today.

## 2018-05-20 NOTE — Care Management Important Message (Signed)
Important Message  Patient Details  Name: [place] D Schreiter MRN: [id number] Date of Birth: [birth year]   Medicare Important Message Given:  Yes    Megan P Shular 05/20/2018, 3:10 PM

## 2018-05-20 NOTE — Progress Notes (Signed)
Subjective:  Doing well.  Denies any chest pain or shortness of breath.  Denies palpitations.  Objective:  Vital Signs in the last 24 hours: Temp:  [97.8 F (36.6 C)-99.2 F (37.3 C)] 97.8 F (36.6 C) (12/23 1226) Pulse Rate:  [63-74] 63 (12/23 1226) Resp:  [19-25] 20 (12/23 1226) BP: (99-139)/(52-87) 134/67 (12/23 1226) SpO2:  [92 %-97 %] 95 % (12/23 1226) Weight:  [72.9 kg] 72.9 kg (12/23 0441)  Intake/Output from previous day: 12/22 0701 - 12/23 0700 In: 1200 [P.O.:1200] Out: -  Intake/Output from this shift: No intake/output data recorded.  Physical Exam: exam unchanged  Lab Results: Recent Labs    05/19/18 0737 05/20/18 0525  WBC 10.0 9.6  HGB 8.6* 8.3*  PLT 320 347   Recent Labs    05/19/18 0737 05/20/18 0525  NA 135 132*  K 4.2 4.1  CL 98 98  CO2 27 26  GLUCOSE 99 89  BUN 7* 9  CREATININE 1.32* 1.49*   No results for input(s): TROPONINI in the last 72 hours.  Invalid input(s): CK, MB Hepatic Function Panel No results for input(s): PROT, ALBUMIN, AST, ALT, ALKPHOS, BILITOT, BILIDIR, IBILI in the last 72 hours. No results for input(s): CHOL in the last 72 hours. No results for input(s): PROTIME in the last 72 hours.  Imaging: Imaging results have been reviewed and No results found.  Cardiac Studies:  Assessment/Plan:  Severe symptomatic mitral regurgitation/tricuspid regurgitation status post MVR/TVR and annuloplasty/Maze procedure and left atrial appendage clippingpostop day8, doing well Postop atelectasis Pulmonary hypertension. 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 Hypertension. Diabetes mellitus. Hyperlipidemia. Osteoarthritis Plan Agree with present management. Follow-up with me as scheduled. Agree with holding angiotensin receptor blocker.  Will consider restarting ARB once renal function improves as outpatient  LOS: 11 days    Diane [name] 05/20/2018, 12:38 PM

## 2018-05-20 NOTE — Progress Notes (Signed)
D/C instructions given to pt. Prescriptions filled by [facility] pharmacy. Wound care and driving restrictions reviewed. CT sutures removed. All questions answered. Grand-daughter to escort home.  Clyde Canterbury, RN

## 2018-05-20 NOTE — Progress Notes (Signed)
CARDIAC REHAB PHASE I   Pt denied ambulation. Stated she had back pain through the night. Pt was educated on IS use, sternal precautions, exercise, diet, [facility], and wound care. Pt was responsive. Pt states she has own equipment at home and home health order is in. Referred to [facility] [place].   [id number]  Jeralyn Bennett BS, ACSM CEP  8:58 AM 05/20/2018

## 2018-05-23 ENCOUNTER — Telehealth (HOSPITAL_COMMUNITY): Payer: Self-pay

## 2018-05-23 NOTE — Telephone Encounter (Signed)
Called patient to see if she is interested in the Cardiac Rehab Program. Patient stated not at this time.  Closed referral

## 2018-05-23 NOTE — Telephone Encounter (Signed)
Pt insurance is active and benefits verified through [facility]. Co-pay $20.00, DED $0.00/$0.00 met, out of pocket $6,700.00/$4,559.63 met, co-insurance 0%. No pre-authorization required. Passport, 05/23/18 @ 11:28AM, REF# [id number]  Will contact patient to see if she is interested in the Cardiac Rehab Program. If interested, patient will need to complete follow up appt. Once completed, patient will be contacted for scheduling upon review by the RN Navigator.

## 2018-05-31 ENCOUNTER — Other Ambulatory Visit: Payer: Self-pay | Admitting: Thoracic Surgery (Cardiothoracic Vascular Surgery)

## 2018-05-31 DIAGNOSIS — Z9889 Other specified postprocedural states: Secondary | ICD-10-CM

## 2018-06-03 ENCOUNTER — Encounter: Payer: Self-pay | Admitting: Physician Assistant

## 2018-06-03 ENCOUNTER — Other Ambulatory Visit: Payer: Self-pay

## 2018-06-03 ENCOUNTER — Ambulatory Visit (INDEPENDENT_AMBULATORY_CARE_PROVIDER_SITE_OTHER): Payer: Self-pay | Admitting: Physician Assistant

## 2018-06-03 ENCOUNTER — Ambulatory Visit
Admission: RE | Admit: 2018-06-03 | Discharge: 2018-06-03 | Disposition: A | Discharge status: Home / Self Care | Payer: Medicare Other | Source: Ambulatory Visit | Attending: Thoracic Surgery (Cardiothoracic Vascular Surgery) | Admitting: Thoracic Surgery (Cardiothoracic Vascular Surgery)

## 2018-06-03 VITALS — BP 120/67 | HR 63 | Resp 18 | Ht 67.0 in | Wt 169.4 lb

## 2018-06-03 DIAGNOSIS — Z9889 Other specified postprocedural states: Secondary | ICD-10-CM

## 2018-06-03 DIAGNOSIS — Z8679 Personal history of other diseases of the circulatory system: Secondary | ICD-10-CM

## 2018-06-03 NOTE — Progress Notes (Signed)
[name]       [place] [address]             [phone]      Diane [name] is a 78 y.o. female patient is s/p MVR, TVR and MAZE.  She is here for her routine 2-week follow-up visit.   1. S/P MVR (mitral valve repair)   2. S/P tricuspid valve repair   3. S/P Maze operation for atrial fibrillation    Past Medical History:  Diagnosis Date  . Anxiety   . Arthritis    back  . Atrial fibrillation ([place])   . Breast cancer ([place])    right breast cancer - 3 years ago  . Chronic diastolic heart failure ([facility])    ECHO: 06/20/2017 - Grade 2 Diastolic Dysfunction   . Coronary artery disease   . Depression   . Diabetes mellitus without complication ([facility])    diet  . Dysrhythmia    afib  . Fibromyalgia   . Herniated disc   . Hyperlipemia   . Hypertension   . Iron deficiency anemia   . Longstanding persistent atrial fibrillation   . Lower back pain   . Lumbar stenosis    L4-5  . Pulmonary hypertension ([place])   . S/P Maze operation for atrial fibrillation 05/09/2018   Complete bilateral atrial lesion set using bipolar radiofrequency and cryothermy ablation with clipping of LA appendage  . 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 S/P radiation therapy 01/23/2013-03/06/2013   Right breast / 46 Gy in 23 fractions / Right breast boost / 14 Gy in 7 fractions  . 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 Severe mitral valve regurgitation   . Spondylolysis   . 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 Status post chemotherapy 11/04/2012 - 01/06/2013.   Docetaxel/Cytoxan Q 3 Weeks x 4 cycles  . Torn rotator cuff   . Tricuspid regurgitation    No past surgical history pertinent negatives on file. Scheduled Meds: Current Outpatient Medications on File Prior to Visit  Medication Sig Dispense Refill  . acetaminophen (TYLENOL) 500 MG tablet Take 1,000 mg by  mouth every 6 (six) hours as needed for headache (pain).    [name] Kitchen albuterol (PROVENTIL HFA;VENTOLIN HFA) 108 (90 BASE) MCG/ACT inhaler Inhale 2 puffs into the lungs every 4 (four) hours as needed for wheezing or shortness of breath. 1 Inhaler 0  . aspirin EC 81 MG EC tablet Take 1 tablet (81 mg total) by mouth daily.    [name] Kitchen atorvastatin (LIPITOR) 20 MG tablet Take 20 mg by mouth daily.   11  . furosemide (LASIX) 40 MG tablet Take 1 tablet (40 mg total) by mouth daily. 30 tablet 1  . metFORMIN (GLUCOPHAGE-XR) 500 MG 24 hr tablet Take 1 tablet (500 mg total) by mouth daily with breakfast. Total dose of 1000mg  daily (Patient taking differently: Take 500 mg by mouth daily. ) 30 tablet 3  . ONETOUCH VERIO test strip FOR USE WHEN CHECKING BLOOD GLUCOSE ONCE DAILY ALTERNATING AM AND PM BEFORE MEALS FINGER STICK 90 DAYS  3  . potassium chloride SA (K-DUR,KLOR-CON) 20 MEQ tablet Take 1 tablet (20 mEq total) by mouth daily. 30 tablet 1  . spironolactone (ALDACTONE) 25 MG tablet Take 1 tablet (25 mg total) by mouth daily. 30 tablet 1  .  traMADol (ULTRAM) 50 MG tablet Take 1-2 tablets (50-100 mg total) by mouth every 6 (six) hours as needed for moderate pain. 30 tablet 0  . warfarin (COUMADIN) 2 MG tablet Take 1 tablet (2 mg total) by mouth daily at 6 PM. 100 tablet 1   No current facility-administered medications on file prior to visit.     Allergies  Allergen Reactions  . Shellfish Allergy Anaphylaxis   Active Problems:   * No active hospital problems. *  Blood pressure 120/67, pulse 63, resp. rate 18, height 5\' 7"  (1.702 m), weight 76.8 kg, SpO2 95 %.  Subjective [name] underwent a mitral valve repair, tricuspid valve repair, and maze procedure with Dr. Roxy Manns.  She is here today for her routine 2-week follow-up visit.   Objective   Cor: irregularly irregular Pulm: CTA bilaterally and in all fields Abd: no tenderness Wound: sternal incision c/d/i, right groin cut down c/d/i no hematoma Ext: no  edema  CLINICAL DATA:  Left anterior chest pain. History of open heart surgery and atrial fibrillation.  EXAM: CHEST - 2 VIEW  COMPARISON:  Radiographs 05/15/2018 and 05/13/2018.  FINDINGS: Stable cardiomegaly status post median sternotomy, mitral and tricuspid valve surgery and left atrial appendage clipping. There is interval improved aeration of the lung bases with near complete resolution of the previously demonstrated basilar atelectasis and small pleural effusions. There is no edema, confluent airspace opacity or pneumothorax. The bones appear unchanged.  IMPRESSION: Resolving postsurgical changes status post recent open heart surgery. Stable cardiomegaly. No acute findings.   Electronically Signed   By: Richardean Sale M.D.   On: 06/03/2018 13:13   Assessment & Plan   Diane [name] is recovering quite well from her surgery.  She is no longer taking narcotic pain medication.  She has been driving against medical recommendation as a necessity.  She has nobody to drive her to her doctor's appointments.  She had an appointment on 05/27/2018 with Dr. Terrence Dupont.  He obtained an EKG at that time which I cannot view.  She does appear to have some irregularities on auscultation today but I cannot be sure if she is in rate controlled atrial fibrillation versus normal sinus rhythm with PVCs without a rhythm strip or EKG.  Regardless, she is therapeutic on her Coumadin according to her most recent INR.  She last had her INR drawn in Dr. Zenia Resides office during that appointment, however I cannot see this INR.  She does not have any follow-up visit in which I suggested she have at least once a week until a dose for her Coumadin is established.  She plans to call his office today to try and schedule an INR draw Friday.  Otherwise, she is getting around well.  She does not feel short of breath with ambulation.  She has not had much pain however, her only complaint is lack of sleep.  She is  able to fall asleep and sleep for short amount of times.  She wakes up in the morning fatigued and it takes a while for her to wake up.  I went over some sleep hygiene including limiting cell phone usage or before bed, not eating or drinking anything up for bed, and sleeping in a dark cool environment.  She does not take any medicines at night that would keep her awake.  She is encouraged to establish a routine when it comes to sleep.  She is not interested in partaking in a cardiac rehab program at this time due  to the cost.  She is doing plenty of walking on her own at this time.  She plans to follow-up in our office in 2 months to see Dr. Roxy Manns.  She is encouraged to call our office if any changes occur before that time and we would be happy to see her.  No new medication changes at this time.  Elgie Collard 06/03/2018

## 2018-06-19 ENCOUNTER — Ambulatory Visit: Payer: Medicare Other | Admitting: Orthotics

## 2018-06-19 DIAGNOSIS — M2011 Hallux valgus (acquired), right foot: Secondary | ICD-10-CM | POA: Diagnosis not present

## 2018-06-19 DIAGNOSIS — E1140 Type 2 diabetes mellitus with diabetic neuropathy, unspecified: Secondary | ICD-10-CM

## 2018-06-19 DIAGNOSIS — M25572 Pain in left ankle and joints of left foot: Secondary | ICD-10-CM

## 2018-06-19 DIAGNOSIS — M2012 Hallux valgus (acquired), left foot: Secondary | ICD-10-CM

## 2018-06-19 DIAGNOSIS — M7752 Other enthesopathy of left foot: Secondary | ICD-10-CM

## 2018-07-15 LAB — ECHO TEE
AV Mean grad: 3 mmHg
LVOT diameter: 17 mm
Mean grad: 2 mmHg
STJ: 2 cm
Sinus: 2.5 cm

## 2018-07-31 ENCOUNTER — Ambulatory Visit: Payer: Medicare Other | Admitting: Podiatry

## 2018-07-31 ENCOUNTER — Other Ambulatory Visit: Payer: Self-pay | Admitting: Geriatric Medicine

## 2018-07-31 ENCOUNTER — Encounter: Payer: Self-pay | Admitting: Podiatry

## 2018-07-31 DIAGNOSIS — Z1231 Encounter for screening mammogram for malignant neoplasm of breast: Secondary | ICD-10-CM

## 2018-07-31 DIAGNOSIS — E1140 Type 2 diabetes mellitus with diabetic neuropathy, unspecified: Secondary | ICD-10-CM

## 2018-07-31 DIAGNOSIS — B351 Tinea unguium: Secondary | ICD-10-CM

## 2018-07-31 DIAGNOSIS — M2011 Hallux valgus (acquired), right foot: Secondary | ICD-10-CM

## 2018-07-31 DIAGNOSIS — M79676 Pain in unspecified toe(s): Secondary | ICD-10-CM | POA: Diagnosis not present

## 2018-07-31 NOTE — Progress Notes (Signed)
Patient ID: Diane [name], female   DOB: [birth year], 78 y.o.   MRN: [id number]   Complaint:  Visit Type: Patient returns to my office for continued preventative foot care services. Complaint: Patient states" my nails have grown long and thick and become painful to walk and wear shoes" Patient has been diagnosed with DM with neuropathy.. The patient presents for preventative foot care services. No changes to ROS.  Podiatric Exam: Vascular: dorsalis pedis and posterior tibial pulses are palpable bilateral. Capillary return is immediate. Temperature gradient is WNL. Skin turgor WNL  Sensorium: Diminished  Semmes Weinstein monofilament test. Normal tactile sensation bilaterally. Nail Exam: Pt has thick disfigured discolored nails with subungual debris noted bilateral entire nail hallux through fifth toenails Ulcer Exam: There is no evidence of ulcer or pre-ulcerative changes or infection. Orthopedic Exam: Muscle tone and strength are WNL. No limitations in general ROM. No crepitus or effusions noted. Foot type and digits show no abnormalities.HAV 1st MPJ B/L. Skin: No Porokeratosis. No infection or ulcers  Diagnosis:  Onychomycosis, , Pain in right toe, pain in left toes  HAV  B/L.  Treatment & Plan Procedures and Treatment: Consent by patient was obtained for treatment procedures. The patient understood the discussion of treatment and procedures well. All questions were answered thoroughly reviewed. Debridement of mycotic and hypertrophic toenails, 1 through 5 bilateral and clearing of subungual debris. No ulceration, no infection noted.  .  Return Visit-Office Procedure: Patient instructed to return to the office for a follow up visit 3 months for continued evaluation and treatment.   Gardiner Barefoot DPM

## 2018-08-12 ENCOUNTER — Encounter: Payer: Self-pay | Admitting: Thoracic Surgery (Cardiothoracic Vascular Surgery)

## 2018-08-12 ENCOUNTER — Other Ambulatory Visit: Payer: Self-pay

## 2018-08-12 ENCOUNTER — Ambulatory Visit (INDEPENDENT_AMBULATORY_CARE_PROVIDER_SITE_OTHER): Payer: Medicare Other | Admitting: Thoracic Surgery (Cardiothoracic Vascular Surgery)

## 2018-08-12 VITALS — BP 112/64 | HR 78 | Resp 16 | Ht 67.0 in | Wt 176.0 lb

## 2018-08-12 DIAGNOSIS — Z8679 Personal history of other diseases of the circulatory system: Secondary | ICD-10-CM

## 2018-08-12 DIAGNOSIS — Z9889 Other specified postprocedural states: Secondary | ICD-10-CM | POA: Diagnosis not present

## 2018-08-12 DIAGNOSIS — I4811 Longstanding persistent atrial fibrillation: Secondary | ICD-10-CM

## 2018-08-12 NOTE — Patient Instructions (Addendum)
Continue all previous medications without any changes at this time  You may resume unrestricted physical activity without any particular limitations at this time.  Make every effort to stay physically active, get some type of exercise on a regular basis, and stick to a "heart healthy diet".  The long term benefits for regular exercise and a healthy diet are critically important to your overall health and wellbeing.  Make every effort to keep your diabetes under very tight control.  Follow up closely with your primary care physician or endocrinologist and strive to keep their hemoglobin A1c levels as low as possible, preferably near or below 6.0.  The long term benefits of strict control of diabetes are far reaching and critically important for your overall health and survival.  Endocarditis is a potentially serious infection of heart valves or inside lining of the heart.  It occurs more commonly in patients with diseased heart valves (such as patient's with aortic or mitral valve disease) and in patients who have undergone heart valve repair or replacement.  Certain surgical and dental procedures may put you at risk, such as dental cleaning, other dental procedures, or any surgery involving the respiratory, urinary, gastrointestinal tract, gallbladder or prostate gland.   To minimize your chances for develooping endocarditis, maintain good oral health and seek prompt medical attention for any infections involving the mouth, teeth, gums, skin or urinary tract.    Always notify your doctor or dentist about your underlying heart valve condition before having any invasive procedures. You will need to take antibiotics before certain procedures, including all routine dental cleanings or other dental procedures.  Your cardiologist or dentist should prescribe these antibiotics for you to be taken ahead of time.

## 2018-08-12 NOTE — Progress Notes (Signed)
[address]       [place] [address]             [phone]     CARDIOTHORACIC SURGERY OFFICE NOTE  Referring Provider is [name], MD PCP is Lajean Manes, MD   HPI:  Patient is a 78 year old African-American female with history of mitral regurgitation, persistent atrial fibrillation, hypertension, chronic diastolic congestive heart failure, type 2 diabetes mellitus, hyperlipidemia, iron deficient anemia, arthritis, and depression who returns to the office today for routine follow-up status post mitral valve repair, tricuspid valve repair, and Maze procedure on May 09, 2018 for severe symptomatic primary mitral regurgitation, tricuspid regurgitation, and longstanding persistent atrial fibrillation.  Her early postoperative recovery in the hospital was expectedly slow but overall uncomplicated.  Routine follow-up echocardiogram performed prior to hospital discharge on May 16, 2018 revealed normal left ventricular systolic function with ejection fraction estimated 55 to 60%.  The mitral valve repair was intact with no residual mitral regurgitation.  Mean transvalvular gradient was estimated 4 mmHg.  The tricuspid valve repair was also intact with no residual tricuspid regurgitation.  There was mild to moderate aortic insufficiency, unchanged from preoperatively.  He was eventually discharged on the 11th postoperative day on warfarin anticoagulation.  She was last seen in our office on June 03, 2018 at which time she was doing well.  She has been followed by Dr. Terrence Dupont who has been managing her anticoagulation using warfarin.  She returns to our office today for routine follow-up approximately 3 months postoperatively.  She reports that she is doing exceptionally well.  She states that she feels "like a new woman".  She has no exertional shortness of breath.  She no longer has any significant pain in her chest.  She has not had any palpitations or other  symptoms to suggest a recurrence of atrial fibrillation.  Her activity level is quite good and she plans to join a local gym to get exercise.  Her only complaint is that of some pain in her legs with ambulation.  She remains anticoagulated using warfarin and has not had problems.  This is been monitored closely by Dr. Terrence Dupont.  Overall she is delighted with her progress.   Current Outpatient Medications  Medication Sig Dispense Refill   aspirin EC 81 MG tablet Take 81 mg by mouth daily.     atorvastatin (LIPITOR) 20 MG tablet Take 20 mg by mouth daily.   11   furosemide (LASIX) 40 MG tablet Take 1 tablet (40 mg total) by mouth daily. 30 tablet 1   metFORMIN (GLUCOPHAGE-XR) 500 MG 24 hr tablet Take 1 tablet (500 mg total) by mouth daily with breakfast. Total dose of 1000mg  daily (Patient taking differently: Take 500 mg by mouth daily. ) 30 tablet 3   ONETOUCH VERIO test strip FOR USE WHEN CHECKING BLOOD GLUCOSE ONCE DAILY ALTERNATING AM AND PM BEFORE MEALS FINGER STICK 90 DAYS  3   potassium chloride SA (K-DUR,KLOR-CON) 20 MEQ tablet Take 1 tablet (20 mEq total) by mouth daily. 30 tablet 1   spironolactone (ALDACTONE) 25 MG tablet Take 1 tablet (25 mg total) by mouth daily. 30 tablet 1   warfarin (COUMADIN) 3 MG tablet TAKE 1 TABLET BY MOUTH ONCE DAILY AT 6 PM     No current facility-administered medications for this visit.       Physical Exam:   BP 112/64 (BP Location: Right Arm, Patient Position: Sitting, Cuff Size: Large)  Pulse 78    Resp 16    Ht 5\' 7"  (1.702 m)    Wt 176 lb (79.8 kg)    SpO2 95% Comment: ON RA   BMI 27.57 kg/m   General:  Well-appearing  Chest:   Clear to auscultation with symmetrical breath sounds  CV:   Regular rate and rhythm without murmur  Incisions:  Completely healed, sternum is stable  Abdomen:  Soft nontender  Extremities:  Warm and well-perfused, no edema  Diagnostic Tests:  2 channel telemetry rhythm strip demonstrates normal sinus  rhythm   Impression:  Patient is doing very well and maintaining sinus rhythm approximately 3 months status post mitral valve repair, tricuspid valve repair, and Maze procedure  Plan:  We have not recommended any changes to the patient's current medications.  However, I think it would be reasonable to consider stopping warfarin and resuming anticoagulation using Pradaxa, which the patient had been taking for years prior to her surgery.  We will leave this decision up to the discretion of Dr. Terrence Dupont.  I have encouraged the patient to increase her physical activity without any particular limitations.  We have discussed the importance of regular exercise and a heart healthy diet.  We also discussed the importance of glycemic control and attention to management of diabetes.  The patient has been reminded regarding the importance of dental hygiene and the lifelong need for antibiotic prophylaxis for all dental cleanings and other related invasive procedures.  The patient will continue to follow-up regularly with Dr. Terrence Dupont.  She will return to our office for routine follow-up and rhythm check next December, approximately 1 year following her surgery.  She will call and return sooner should specific problems or questions arise.   I spent in excess of 15 minutes during the conduct of this office consultation and >50% of this time involved direct face-to-face encounter with the patient for counseling and/or coordination of their care.    Valentina Gu. Roxy Manns, MD 08/12/2018 11:06 AM

## 2018-08-27 NOTE — Progress Notes (Signed)

## 2018-09-09 ENCOUNTER — Ambulatory Visit: Payer: Medicare Other

## 2018-10-22 ENCOUNTER — Ambulatory Visit
Admission: RE | Admit: 2018-10-22 | Discharge: 2018-10-22 | Disposition: A | Discharge status: Home / Self Care | Payer: Medicare Other | Source: Ambulatory Visit | Attending: Geriatric Medicine | Admitting: Geriatric Medicine

## 2018-10-22 ENCOUNTER — Other Ambulatory Visit: Payer: Self-pay

## 2018-10-22 DIAGNOSIS — Z1231 Encounter for screening mammogram for malignant neoplasm of breast: Secondary | ICD-10-CM

## 2018-10-23 ENCOUNTER — Other Ambulatory Visit: Payer: Self-pay | Admitting: Geriatric Medicine

## 2018-10-23 DIAGNOSIS — R921 Mammographic calcification found on diagnostic imaging of breast: Secondary | ICD-10-CM

## 2018-10-28 ENCOUNTER — Other Ambulatory Visit: Payer: Self-pay | Admitting: Geriatric Medicine

## 2018-10-28 ENCOUNTER — Other Ambulatory Visit: Payer: Self-pay

## 2018-10-28 ENCOUNTER — Ambulatory Visit
Admission: RE | Admit: 2018-10-28 | Discharge: 2018-10-28 | Disposition: A | Discharge status: Home / Self Care | Payer: Medicare Other | Source: Ambulatory Visit | Attending: Geriatric Medicine | Admitting: Geriatric Medicine

## 2018-10-28 DIAGNOSIS — R921 Mammographic calcification found on diagnostic imaging of breast: Secondary | ICD-10-CM

## 2018-10-28 HISTORY — DX: Personal history of irradiation: Z92.3

## 2018-10-28 HISTORY — DX: Personal history of antineoplastic chemotherapy: Z92.21

## 2018-11-06 ENCOUNTER — Ambulatory Visit: Payer: Medicare Other | Admitting: Podiatry

## 2018-11-06 ENCOUNTER — Encounter: Payer: Self-pay | Admitting: Podiatry

## 2018-11-06 ENCOUNTER — Other Ambulatory Visit: Payer: Self-pay

## 2018-11-06 VITALS — Temp 97.0°F

## 2018-11-06 DIAGNOSIS — E1140 Type 2 diabetes mellitus with diabetic neuropathy, unspecified: Secondary | ICD-10-CM

## 2018-11-06 DIAGNOSIS — B351 Tinea unguium: Secondary | ICD-10-CM | POA: Diagnosis not present

## 2018-11-06 DIAGNOSIS — M79676 Pain in unspecified toe(s): Secondary | ICD-10-CM

## 2018-11-06 NOTE — Progress Notes (Signed)
Patient ID: Diane [name], female   DOB: 05/22/1941, 78 y.o.   MRN: [id number]   Complaint:  Visit Type: Patient returns to my office for continued preventative foot care services. Complaint: Patient states" my nails have grown long and thick and become painful to walk and wear shoes" Patient has been diagnosed with DM with neuropathy.. The patient presents for preventative foot care services. No changes to ROS.  Podiatric Exam: Vascular: dorsalis pedis and posterior tibial pulses are palpable bilateral. Capillary return is immediate. Temperature gradient is WNL. Skin turgor WNL  Sensorium: Diminished  Semmes Weinstein monofilament test. Normal tactile sensation bilaterally. Nail Exam: Pt has thick disfigured discolored nails with subungual debris noted bilateral entire nail hallux through fifth toenails Ulcer Exam: There is no evidence of ulcer or pre-ulcerative changes or infection. Orthopedic Exam: Muscle tone and strength are WNL. No limitations in general ROM. No crepitus or effusions noted. Foot type and digits show no abnormalities.HAV 1st MPJ B/L. Skin: No Porokeratosis. No infection or ulcers  Diagnosis:  Onychomycosis, , Pain in right toe, pain in left toes  HAV  B/L.  Treatment & Plan Procedures and Treatment: Consent by patient was obtained for treatment procedures. The patient understood the discussion of treatment and procedures well. All questions were answered thoroughly reviewed. Debridement of mycotic and hypertrophic toenails, 1 through 5 bilateral and clearing of subungual debris. No ulceration, no infection noted.  .  Return Visit-Office Procedure: Patient instructed to return to the office for a follow up visit 3 months for continued evaluation and treatment.   Gardiner Barefoot DPM

## 2018-11-18 ENCOUNTER — Ambulatory Visit (HOSPITAL_COMMUNITY)
Admission: RE | Admit: 2018-11-18 | Discharge: 2018-11-18 | Disposition: A | Discharge status: Home / Self Care | Payer: Medicare Other | Source: Ambulatory Visit | Attending: Physician Assistant | Admitting: Physician Assistant

## 2018-11-18 ENCOUNTER — Encounter (HOSPITAL_COMMUNITY): Payer: Self-pay | Admitting: Physician Assistant

## 2018-11-18 ENCOUNTER — Other Ambulatory Visit: Payer: Self-pay

## 2018-11-18 VITALS — BP 128/74 | HR 73 | Ht 67.0 in | Wt 195.0 lb

## 2018-11-18 DIAGNOSIS — I5032 Chronic diastolic (congestive) heart failure: Secondary | ICD-10-CM | POA: Diagnosis not present

## 2018-11-18 DIAGNOSIS — Z6830 Body mass index (BMI) 30.0-30.9, adult: Secondary | ICD-10-CM | POA: Diagnosis not present

## 2018-11-18 DIAGNOSIS — Z7984 Long term (current) use of oral hypoglycemic drugs: Secondary | ICD-10-CM | POA: Insufficient documentation

## 2018-11-18 DIAGNOSIS — I2510 Atherosclerotic heart disease of native coronary artery without angina pectoris: Secondary | ICD-10-CM | POA: Insufficient documentation

## 2018-11-18 DIAGNOSIS — F329 Major depressive disorder, single episode, unspecified: Secondary | ICD-10-CM | POA: Diagnosis not present

## 2018-11-18 DIAGNOSIS — F419 Anxiety disorder, unspecified: Secondary | ICD-10-CM | POA: Insufficient documentation

## 2018-11-18 DIAGNOSIS — I4819 Other persistent atrial fibrillation: Secondary | ICD-10-CM | POA: Insufficient documentation

## 2018-11-18 DIAGNOSIS — M1990 Unspecified osteoarthritis, unspecified site: Secondary | ICD-10-CM | POA: Insufficient documentation

## 2018-11-18 DIAGNOSIS — M797 Fibromyalgia: Secondary | ICD-10-CM | POA: Diagnosis not present

## 2018-11-18 DIAGNOSIS — E785 Hyperlipidemia, unspecified: Secondary | ICD-10-CM | POA: Insufficient documentation

## 2018-11-18 DIAGNOSIS — M48061 Spinal stenosis, lumbar region without neurogenic claudication: Secondary | ICD-10-CM | POA: Diagnosis not present

## 2018-11-18 DIAGNOSIS — Z7901 Long term (current) use of anticoagulants: Secondary | ICD-10-CM | POA: Insufficient documentation

## 2018-11-18 DIAGNOSIS — Z7982 Long term (current) use of aspirin: Secondary | ICD-10-CM | POA: Insufficient documentation

## 2018-11-18 DIAGNOSIS — I110 Hypertensive heart disease with heart failure: Secondary | ICD-10-CM | POA: Insufficient documentation

## 2018-11-18 DIAGNOSIS — E119 Type 2 diabetes mellitus without complications: Secondary | ICD-10-CM | POA: Insufficient documentation

## 2018-11-18 DIAGNOSIS — Z79899 Other long term (current) drug therapy: Secondary | ICD-10-CM | POA: Insufficient documentation

## 2018-11-18 DIAGNOSIS — E669 Obesity, unspecified: Secondary | ICD-10-CM | POA: Diagnosis not present

## 2018-11-18 DIAGNOSIS — I340 Nonrheumatic mitral (valve) insufficiency: Secondary | ICD-10-CM | POA: Insufficient documentation

## 2018-11-18 DIAGNOSIS — Z87891 Personal history of nicotine dependence: Secondary | ICD-10-CM | POA: Diagnosis not present

## 2018-11-18 DIAGNOSIS — I272 Pulmonary hypertension, unspecified: Secondary | ICD-10-CM | POA: Insufficient documentation

## 2018-11-18 NOTE — Progress Notes (Signed)
Primary Care Physician: Lajean Manes, MD Primary Cardiologist: Dr Terrence Dupont (Dr Roxy Manns CTS) Referring Physician: Dr Roxy Manns   Diane [name] is a 78 y.o. female with a history of mitral regurgitation, persistent atrial fibrillation, hypertension, chronic diastolic congestive heart failure, type 2 diabetes mellitus, hyperlipidemia, iron deficient anemia, arthritis, and depressionparoxysmal/ persistent atrial fibrillation who presents for consultation in the [facility]. Patient underwent MVR/TVR/MAZE/LAA clipping on 05/03/18 with Dr Roxy Manns. Patient reports that since that time she feels "200% better." She denies any heart racing or palpitations or breathing issues.   Today, she denies symptoms of palpitations, chest pain, shortness of breath, orthopnea, PND, lower extremity edema, dizziness, presyncope, syncope, snoring, daytime somnolence, bleeding, or neurologic sequela. The patient is tolerating medications without difficulties and is otherwise without complaint today.    Atrial Fibrillation Risk Factors:  she does not have symptoms or diagnosis of sleep apnea.  she has a BMI of Body mass index is 30.54 kg/m.[name] Kitchen Filed Weights   11/18/18 1411  Weight: 88.5 kg    Family History  Problem Relation Age of Onset   Stroke Mother    Alzheimer's disease Mother    Heart attack Father    Heart attack Brother    Breast cancer Neg Hx      Past Medical History:  Diagnosis Date   Anxiety    Arthritis    back   Atrial fibrillation ([place])    Breast cancer ([place])    right breast cancer - 3 years ago   Chronic diastolic heart failure ([place])    ECHO: 06/20/2017 - Grade 2 Diastolic Dysfunction    Coronary artery disease    Depression    Diabetes mellitus without complication (HCC)    diet   Dysrhythmia    afib   Fibromyalgia    Herniated disc    Hyperlipemia    Hypertension    Iron deficiency anemia    Longstanding persistent atrial  fibrillation    Lower back pain    Lumbar stenosis    L4-5   Personal history of chemotherapy    Personal history of radiation therapy    Pulmonary hypertension ([place])    S/P Maze operation for atrial fibrillation 05/09/2018   Complete bilateral atrial lesion set using bipolar radiofrequency and cryothermy ablation with clipping of LA appendage   S/P mitral valve repair 05/09/2018   26 mm Sorin Memo 4D ring annuloplasty   S/P radiation therapy 01/23/2013-03/06/2013   Right breast / 46 Gy in 23 fractions / Right breast boost / 14 Gy in 7 fractions   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   Docetaxel/Cytoxan Q 3 Weeks x 4 cycles   Torn rotator cuff    Tricuspid regurgitation    Past Surgical History:  Procedure Laterality Date   ABDOMINAL HYSTERECTOMY     BREAST BIOPSY Right 09/04/2012   BREAST LUMPECTOMY Right 10/08/2012   CLIPPING OF ATRIAL APPENDAGE N/A 05/09/2018   Procedure: CLIPPING OF ATRIAL APPENDAGE USING ATRICLIP PRO2 CLIP SIZE 45MM;  Surgeon: Rexene Alberts, MD;  Location: [facility];  Service: Open Heart Surgery;  Laterality: N/A;   COLONOSCOPY     DILATION AND CURETTAGE OF UTERUS     LEFT HEART CATHETERIZATION WITH CORONARY  ANGIOGRAM N/A 09/14/2014   Procedure: LEFT HEART CATHETERIZATION WITH CORONARY ANGIOGRAM;  Surgeon: [name], MD;  Location: [facility] CATH LAB;  Service: Cardiovascular;  Laterality: N/A;   MAZE N/A 05/09/2018   Procedure: MAZE;  Surgeon: Rexene Alberts, MD;  Location: [place];  Service: Open Heart Surgery;  Laterality: N/A;   MITRAL VALVE REPAIR N/A 05/09/2018   Procedure: MITRAL VALVE REPAIR (MVR) USING MEMO 4D RING SIZE 26MM;  Surgeon: Rexene Alberts, MD;  Location: [place];  Service: Open Heart Surgery;  Laterality: N/A;     OPEN REDUCTION INTERNAL FIXATION (ORIF) PROXIMAL PHALANX Right 01/04/2016   Procedure: OPEN TREATMENT OF RIGHT THUMB PROXIMAL PHALANX FRACTURE;  Surgeon: Milly Jakob, MD;  Location: [place];  Service: Orthopedics;  Laterality: Right;   PARTIAL MASTECTOMY WITH NEEDLE LOCALIZATION AND AXILLARY SENTINEL LYMPH NODE BX Right 10/08/2012   Procedure: RIGHT PARTIAL MASTECTOMY WITH NEEDLE LOCALIZATION AND RIGHT AXILLARY SENTINEL LYMPH NODE BX;  Surgeon: Adin Hector, MD;  Location: [place];  Service: General;  Laterality: Right;  Injection of 1% Methylene Blue dye into right breast   PORTACATH PLACEMENT Left 10/08/2012   Procedure: INSERTION PORT-A-CATH WITH ULTRASOUND GUIDANCE;  Surgeon: Adin Hector, MD;  Location: [place];  Service: General;  Laterality: Left;  Using 72F Powerport Clearvue   Posterior Lumbar Arthrodesis, Pedicle screw fixation, Posterolateral Arthodesis  03/17/11   RIGHT/LEFT HEART CATH AND CORONARY ANGIOGRAPHY N/A 05/02/2018   Procedure: RIGHT/LEFT HEART CATH AND CORONARY ANGIOGRAPHY;  Surgeon: [name], MD;  Location: [place] CV LAB;  Service: Cardiovascular;  Laterality: N/A;   SHOULDER ARTHROSCOPY WITH ROTATOR CUFF REPAIR AND SUBACROMIAL DECOMPRESSION  06/04/2012   Procedure: SHOULDER ARTHROSCOPY WITH ROTATOR CUFF REPAIR AND SUBACROMIAL DECOMPRESSION;  Surgeon: Nita Sells, MD;  Location: [place];  Service: Orthopedics;  Laterality: Left;  LEFT SHOULDER ARTHROSCOPY WITH ROTATOR CUFF REPAIR AND SUBACROMIAL DECOMPRESSION AND DISTAL CLAVICLE RESECTION   TEE WITHOUT CARDIOVERSION N/A 05/03/2018   Procedure: TRANSESOPHAGEAL ECHOCARDIOGRAM (TEE);  Surgeon: Dixie Dials, MD;  Location: [facility] ENDOSCOPY;  Service: Cardiovascular;  Laterality: N/A;   TEE WITHOUT CARDIOVERSION N/A 05/09/2018   Procedure: TRANSESOPHAGEAL ECHOCARDIOGRAM (TEE);  Surgeon: Rexene Alberts, MD;  Location: [place];  Service: Open Heart Surgery;  Laterality:  N/A;   TONSILLECTOMY     TRICUSPID VALVE REPLACEMENT N/A 05/09/2018   Procedure: TRICUSPID VALVE REPAIR USING MC3 RING SIZE 28MM;  Surgeon: Rexene Alberts, MD;  Location: [place];  Service: Open Heart Surgery;  Laterality: N/A;   TUBAL LIGATION     VULVECTOMY PARTIAL     Wide Local Excision  01/2010   Bilateral groin excisions, re-excision of vulva    Current Outpatient Medications  Medication Sig Dispense Refill   aspirin EC 81 MG tablet Take 81 mg by mouth daily.     busPIRone (BUSPAR) 5 MG tablet Take 5 mg by mouth as needed.      furosemide (LASIX) 40 MG tablet Take 1 tablet (40 mg total) by mouth daily. 30 tablet 1   metFORMIN (GLUCOPHAGE-XR) 500 MG 24 hr tablet Take 1 tablet (500 mg total) by mouth daily with breakfast. Total dose of 1000mg  daily (Patient taking differently: Take 500 mg by mouth daily. ) 30 tablet 3   Potassium Chloride ER 20 MEQ TBCR TAKE 1 TABLET BY MOUTH ONCE DAILY WITH FOOD     rosuvastatin (CRESTOR) 5 MG tablet Take 5 mg by mouth daily.     spironolactone (ALDACTONE) 25 MG  tablet Take 1 tablet (25 mg total) by mouth daily. 30 tablet 1   warfarin (COUMADIN) 3 MG tablet Take 3 mg by mouth daily at 6 PM. On Sunday 1.5 tablets     ONETOUCH VERIO test strip FOR USE WHEN CHECKING BLOOD GLUCOSE ONCE DAILY ALTERNATING AM AND PM BEFORE MEALS FINGER STICK 90 DAYS  3   No current facility-administered medications for this encounter.     Allergies  Allergen Reactions   Shellfish Allergy Anaphylaxis    Social History   Socioeconomic History   Marital status: Widowed    Spouse name: Not on file   Number of children: Not on file   Years of education: Not on file   Highest education level: Not on file  Occupational History   Occupation: retired  Scientist, product/process development strain: Not on file   Food insecurity    Worry: Not on file    Inability: Not on Lexicographer needs    Medical: Not on file    Non-medical: Not on file   Tobacco Use   Smoking status: Former Smoker    Years: 50.00    Types: Cigarettes    Quit date: 05/29/2009    Years since quitting: 9.4   Smokeless tobacco: Never Used  Substance and Sexual Activity   Alcohol use: Yes    Comment: rare   Drug use: No   Sexual activity: Not Currently  Lifestyle   Physical activity    Days per week: Not on file    Minutes per session: Not on file   Stress: Not on file  Relationships   Social connections    Talks on phone: Not on file    Gets together: Not on file    Attends religious service: Not on file    Active member of club or organization: Not on file    Attends meetings of clubs or organizations: Not on file    Relationship status: Not on file   Intimate partner violence    Fear of current or ex partner: Not on file    Emotionally abused: Not on file    Physically abused: Not on file    Forced sexual activity: Not on file  Other Topics Concern   Not on file  Social History Narrative   Tobacco use cigarettes: former smoker, quit in year Sept 2011, Pack-year Hx: 57, tobacco history last updated 10/10/2013. No smoking. Per patient stopped smoking 9 months ago. No alcohol. Caffeine.: yes, 2+ servings daily, tea. No recreational drug use. Exercise: [employer] water aer[employer]. Marital status: widow.     ROS- All systems are reviewed and negative except as per the HPI above.  Physical Exam: Vitals:   11/18/18 1411  BP: 128/74  Pulse: 73  Weight: 88.5 kg  Height: 5\' 7"  (1.702 m)    GEN- The patient is well appearing elederly female, alert and oriented x 3 today.   Head- normocephalic, atraumatic Eyes-  Sclera clear, conjunctiva pink Ears- hearing intact Oropharynx- clear Neck- supple  Lungs- Clear to ausculation bilaterally, normal work of breathing Heart- Regular rate and rhythm, no murmurs, rubs or gallops  GI- soft, NT, ND, + BS Extremities- no clubbing, cyanosis, or edema MS- no significant deformity or atrophy Skin- no  rash or lesion Psych- euthymic mood, full affect Neuro- strength and sensation are intact  Wt Readings from Last 3 Encounters:  11/18/18 88.5 kg  08/12/18 79.8 kg  06/03/18 76.8 kg    EKG  today demonstrates SR HR 74, motion artifact, QRS 86, QTc 444.  Echo 05/16/18 demonstrated  - Left ventricle: The cavity size was normal. Systolic function was   normal. The estimated ejection fraction was in the range of 55%   to 60%. Wall motion was normal; there were no regional wall   motion abnormalities. Doppler parameters are consistent with   abnormal left ventricular relaxation (grade 1 diastolic   dysfunction). - Aortic valve: There was mild to moderate regurgitation. Valve   area (VTI): 2.11 cm^2. Valve area (Vmax): 1.99 cm^2. Valve area   (Vmean): 2.15 cm^2. - Mitral valve: Valve area by continuity equation (using LVOT   flow): 1.91 cm^2. - Left atrium: The atrium was mildly dilated. - Atrial septum: No defect or patent foramen ovale was identified. - Pulmonic valve: Peak gradient (S): 12 mm Hg.  Epic records are reviewed at length today  Assessment and Plan:  1. Persistent atrial fibrillation S/p MAZE 04/2018. Doing well. Continue warfarin   This patients CHA2DS2-VASc Score and unadjusted Ischemic Stroke Rate (% per year) is equal to 9.7 % stroke rate/year from a score of 6  Above score calculated as 1 point each if present [CHF, HTN, DM, Vascular=MI/PAD/Aortic Plaque, Age if 65-74, or Female] Above score calculated as 2 points each if present [Age > 75, or Stroke/TIA/TE]   2. Obesity Body mass index is 30.54 kg/m. Lifestyle modification was discussed at length including regular exercise and weight reduction.  3. Severe MR/TR S/p MVR/TVR/MAZE/LAA clipping   Follow up in AF clinic in one year.   [facility] [address] [place], [place] [address] [phone] 11/18/2018 2:50 PM

## 2018-11-19 NOTE — Addendum Note (Signed)
Encounter addended by: Juluis Mire, RN on: 11/19/2018 3:16 PM  Actions taken: Flowsheet accepted

## 2018-11-21 ENCOUNTER — Ambulatory Visit (HOSPITAL_COMMUNITY): Payer: Medicare Other | Admitting: Physician Assistant

## 2018-12-25 ENCOUNTER — Telehealth: Payer: Self-pay | Admitting: *Deleted

## 2018-12-25 NOTE — Telephone Encounter (Signed)
Called and scheduled an appt for 8/10 to see Dr. Denman George for new patient

## 2018-12-30 ENCOUNTER — Other Ambulatory Visit: Payer: Self-pay | Admitting: Obstetrics and Gynecology

## 2019-01-06 ENCOUNTER — Other Ambulatory Visit: Payer: Self-pay

## 2019-01-06 ENCOUNTER — Inpatient Hospital Stay: Payer: Medicare Other | Attending: Gynecologic Oncology | Admitting: Gynecologic Oncology

## 2019-01-06 ENCOUNTER — Inpatient Hospital Stay: Payer: Medicare Other

## 2019-01-06 ENCOUNTER — Encounter: Payer: Self-pay | Admitting: Gynecologic Oncology

## 2019-01-06 VITALS — BP 151/63 | HR 72 | Temp 98.5°F | Resp 20 | Ht 67.5 in | Wt 201.0 lb

## 2019-01-06 DIAGNOSIS — E119 Type 2 diabetes mellitus without complications: Secondary | ICD-10-CM | POA: Insufficient documentation

## 2019-01-06 DIAGNOSIS — I38 Endocarditis, valve unspecified: Secondary | ICD-10-CM | POA: Insufficient documentation

## 2019-01-06 DIAGNOSIS — Z952 Presence of prosthetic heart valve: Secondary | ICD-10-CM | POA: Diagnosis not present

## 2019-01-06 DIAGNOSIS — Z7901 Long term (current) use of anticoagulants: Secondary | ICD-10-CM | POA: Diagnosis not present

## 2019-01-06 DIAGNOSIS — E1151 Type 2 diabetes mellitus with diabetic peripheral angiopathy without gangrene: Secondary | ICD-10-CM | POA: Insufficient documentation

## 2019-01-06 DIAGNOSIS — C519 Malignant neoplasm of vulva, unspecified: Secondary | ICD-10-CM | POA: Insufficient documentation

## 2019-01-06 DIAGNOSIS — I4891 Unspecified atrial fibrillation: Secondary | ICD-10-CM | POA: Diagnosis not present

## 2019-01-06 DIAGNOSIS — E669 Obesity, unspecified: Secondary | ICD-10-CM | POA: Diagnosis not present

## 2019-01-06 DIAGNOSIS — R1032 Left lower quadrant pain: Secondary | ICD-10-CM | POA: Insufficient documentation

## 2019-01-06 DIAGNOSIS — Z79899 Other long term (current) drug therapy: Secondary | ICD-10-CM | POA: Diagnosis not present

## 2019-01-06 DIAGNOSIS — Z87891 Personal history of nicotine dependence: Secondary | ICD-10-CM | POA: Insufficient documentation

## 2019-01-06 DIAGNOSIS — I110 Hypertensive heart disease with heart failure: Secondary | ICD-10-CM | POA: Insufficient documentation

## 2019-01-06 DIAGNOSIS — Z7984 Long term (current) use of oral hypoglycemic drugs: Secondary | ICD-10-CM | POA: Diagnosis not present

## 2019-01-06 DIAGNOSIS — I2510 Atherosclerotic heart disease of native coronary artery without angina pectoris: Secondary | ICD-10-CM | POA: Insufficient documentation

## 2019-01-06 DIAGNOSIS — D071 Carcinoma in situ of vulva: Secondary | ICD-10-CM | POA: Diagnosis not present

## 2019-01-06 DIAGNOSIS — M797 Fibromyalgia: Secondary | ICD-10-CM | POA: Diagnosis not present

## 2019-01-06 DIAGNOSIS — E785 Hyperlipidemia, unspecified: Secondary | ICD-10-CM | POA: Diagnosis not present

## 2019-01-06 LAB — HEMOGLOBIN A1C
Hgb A1c MFr Bld: 6.1 % — ABNORMAL HIGH (ref 4.8–5.6)
Mean Plasma Glucose: 128.37 mg/dL

## 2019-01-06 NOTE — Patient Instructions (Signed)
Preparing for your Surgery  Plan for surgery on either August 27 or September 3 with Dr. Everitt Amber at [facility] will be scheduled for a left vulvectomy.   YOU WILL BE SCHEDULED FOR AN ULTRASOUND OF THE GROIN TO EVALUATE THE PULLING SENSATION YOU ARE EXPERIENCING.    We will also be checking a hemoglobin AIC today prior to surgery. We will need to obtain cardiac clearance prior to surgery. Dr. Serita Grit recommendation is to stop Coumadin 7 days before and bridge with lovenox injections with no injection on the morning of surgery but we will check this with your cardiologist and let you know for sure.    Pre-operative Testing -You will receive a phone call from presurgical testing at [facility] if you have not received a call already to arrange for a pre-operative testing appointment before your surgery.  This appointment normally occurs one to two weeks before your scheduled surgery.   -Bring your insurance card, copy of an advanced directive if applicable, medication list  -At that visit, you will be asked to sign a consent for a possible blood transfusion in case a transfusion becomes necessary during surgery.  The need for a blood transfusion is rare but having consent is a necessary part of your care.     -Do not take supplements such as fish oil (omega 3), red yeast rice, tumeric before your surgery.  Day Before Surgery at [facility] will be advised to have nothing to eat or drink after midnight the evening before.    Your role in recovery Your role is to become active as soon as directed by your doctor, while still giving yourself time to heal.  Rest when you feel tired. You will be asked to do the following in order to speed your recovery:  - Cough and breathe deeply. This helps toclear and expand your lungs and can prevent pneumonia. You may be given a spirometer to practice deep breathing. A staff member will show you how to use the spirometer. - Do mild  physical activity. Walking or moving your legs help your circulation and body functions return to normal. A staff member will help you when you try to walk and will provide you with simple exercises. Do not try to get up or walk alone the first time. - Actively manage your pain. Managing your pain lets you move in comfort. We will ask you to rate your pain on a scale of zero to 10. It is your responsibility to tell your doctor or nurse where and how much you hurt so your pain can be treated.  Special Considerations -If you are diabetic, you may be placed on insulin after surgery to have closer control over your blood sugars to promote healing and recovery.  This does not mean that you will be discharged on insulin.  If applicable, your oral antidiabetics will be resumed when you are tolerating a solid diet.  -Your final pathology results from surgery should be available around one week after surgery and the results will be relayed to you when available.  -Dr. Lahoma Crocker is the surgeon that assists your GYN Oncologist with surgery.  If you end up staying the night, the next day after your surgery you will either see Dr. Denman George or Dr. Lahoma Crocker.  -FMLA forms can be faxed to [phone] and please allow 5-7 business days for completion.  Pain Management After Surgery -You have been prescribed your pain medication and bowel regimen medications  before surgery so that you can have these available when you are discharged from the hospital. The pain medication is for use ONLY AFTER surgery and a new prescription will not be given.   -Make sure that you have Tylenol and Ibuprofen at home to use on a regular basis after surgery for pain control. We recommend alternating the medications every hour to six hours since they work differently and are processed in the body differently for pain relief.  -Review the attached handout on narcotic use and their risks and side effects.   Bowel Regimen  -You have been prescribed Sennakot-S to take nightly to prevent constipation especially if you are taking the narcotic pain medication intermittently.  It is important to prevent constipation and drink adequate amounts of liquids.  Blood Transfusion Information WHAT IS A BLOOD TRANSFUSION? A transfusion is the replacement of blood or some of its parts. Blood is made up of multiple cells which provide different functions.  Red blood cells carry oxygen and are used for blood loss replacement.  White blood cells fight against infection.  Platelets control bleeding.  Plasma helps clot blood.  Other blood products are available for specialized needs, such as hemophilia or other clotting disorders. BEFORE THE TRANSFUSION  Who gives blood for transfusions?   You may be able to donate blood to be used at a later date on yourself (autologous donation).  Relatives can be asked to donate blood. This is generally not any safer than if you have received blood from a stranger. The same precautions are taken to ensure safety when a relative's blood is donated.  Healthy volunteers who are fully evaluated to make sure their blood is safe. This is blood bank blood. Transfusion therapy is the safest it has ever been in the practice of medicine. Before blood is taken from a donor, a complete history is taken to make sure that person has no history of diseases nor engages in risky social behavior (examples are intravenous drug use or sexual activity with multiple partners). The donor's travel history is screened to minimize risk of transmitting infections, such as malaria. The donated blood is tested for signs of infectious diseases, such as HIV and hepatitis. The blood is then tested to be sure it is compatible with you in order to minimize the chance of a transfusion reaction. If you or a relative donates blood, this is often done in anticipation of surgery and is not appropriate for emergency situations. It  takes many days to process the donated blood. RISKS AND COMPLICATIONS Although transfusion therapy is very safe and saves many lives, the main dangers of transfusion include:   Getting an infectious disease.  Developing a transfusion reaction. This is an allergic reaction to something in the blood you were given. Every precaution is taken to prevent this. The decision to have a blood transfusion has been considered carefully by your caregiver before blood is given. Blood is not given unless the benefits outweigh the risks.

## 2019-01-06 NOTE — Progress Notes (Signed)
Consult Note: Gyn-Onc  Consult was requested by Dr. Landry Mellow for the evaluation of Diane [name] 78 y.o. female  CC:  Chief Complaint  Patient presents with  . Vulva cancer [facility])    Assessment/Plan:  [name]  is a 78 y.o.  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  The lesion is exophytic and somewhat fleshy which raises my suspicion for an occult invasive process that was not apparent on pathologic evaluation of the biopsy.  I am recommending a surgical excision with modified radical left vulvectomy to excise the area in its entirety.  We will attempt to get as deeper margin as possible, however this abuts the rectal and anal sphincter and therefore extensive deep margins will not be likely possible.  If malignancy is identified on final pathology, if margins are close, she would require adjuvant radiation to consolidate margin status.  I explained that we will not re-resect her inguinal nodes given that she is already had a complete inguinal lymphadenectomy.  Given that she has some symptoms in the groin, I will order a groin ultrasound scan to evaluate for an occult lymph node metastasis.  She is due to see her cardiologist Dr. Helene Shoe later this month.  We will discuss with him whether she is optimized for a general anesthetic and surgical intervention, and clarify goals of suspending anticoagulant therapy for the procedure.  It would be my preference to have her come off Coumadin 1 week preoperatively and if necessary bridge with Lovenox up until the day of surgery, placing her back on Coumadin immediately postoperatively until her INR levels have reached acceptable goals.  I explained with the patient that being on anticoagulant therapy perioperatively increases her risk for major bleeding complication in addition to wound healing issues.  She has type 2 diabetes mellitus with sequelae.  We will check hemoglobin A1c today and if this  demonstrates poor control, she will need to see her primary care doctor for optimization of her blood glucose status preoperatively as this is also associated with impairments in wound healing.  I explained the surgical procedure and goals to the patient.  I explained anticipated postoperative recovery and recommendations.  She lives alone but has managed independently after prior surgeries including her recent open heart surgery.  Her daughter and granddaughter live locally.   HPI: Diane [name] is a 78 year old woman who was seen in consultation at the request of Dr. 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  In a remote history the patient was seeing my partner, Dr. Nancy Marus up until 2016.  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  This was treated with a radical left posterior vulvectomy with bilateral inguinal lymphadenectomy.  Final pathology showed no metastatic disease.  She required no adjuvant therapy at that time.  She remained free of disease for 5 years and was discharged from care.  In December 2019 she underwent open heart surgery for a mitral and tricuspid valve replacement.  Following the surgery she was placed on Coumadin therapy and began experiencing intermittent vaginal spotting.  She was seen by Dr. Landry Mellow on December 25, 2018.  Dr. Landry Mellow performed a gynecologic evaluation and upon inspection of the external genitalia identified a fleshy pink exophytic 1.5 cm growth in the left introitus posteriorly.  This was biopsied on December 30, 2018 revealed high-grade vulvar  intraepithelial neoplasia, VIN 3.  The biopsy margins were involved.  She reported a history of left inguinal discomfort and pain with elevation of the leg that she had noted for approximately 6 months prior to this diagnosis of recurrence.  It had been worked up and evaluated by her orthopedic surgeon but not felt to  be a primary hip issue or surgical issue.  The patient's medical history is significant for obesity, valvular heart disease (status post open heart surgery with mitral and tricuspid valve replacement in December 2019 with Dr. Roxy Manns), the patient's cardiologist is Dr. Terrence Dupont.  She also has atrial fibrillation since 2016.  She takes Coumadin and aspirin 81 mg.  She has a history of severe restrictive lung disease.  Patient has a history of right-sided breast cancer treated with Dr. Markus Jarvis and [place].  For this years required surgery, radiation, and chemotherapy.  She has aortic atherosclerosis.  She has hypertension and diabetes mellitus treated with oral agents.  Her surgical history significant for a left radical vulvectomy and bilateral inguinal lymphadenectomy in 2011.  She has had a remote history of hysterectomy for abnormal uterine bleeding but no malignancy.  Ovaries removed at the same time.  Patient has had orthopedic surgery on the thumb and rotator cuff.  She had mitral and tricuspid cuspid valve repair in 2019.  She has had cataract surgery.  Patient is an ex-smoker but quit in 2011.  She had a 50-pack-year history prior to that.  She lives alone.  Current Meds:  Outpatient Encounter Medications as of 01/06/2019  Medication Sig  . aspirin EC 81 MG tablet Take 81 mg by mouth daily.  . busPIRone (BUSPAR) 5 MG tablet Take 5 mg by mouth as needed.   . furosemide (LASIX) 40 MG tablet Take 1 tablet (40 mg total) by mouth daily.  . Lancets (ONETOUCH DELICA PLUS [id number]) MISC USE AS DIRECTED DAILY FOR 30 DAYS  . metFORMIN (GLUCOPHAGE-XR) 500 MG 24 hr tablet Take 1 tablet (500 mg total) by mouth daily with breakfast. Total dose of 1000mg  daily (Patient taking differently: Take 500 mg by mouth daily. )  . nitroGLYCERIN (NITROSTAT) 0.4 MG SL tablet DISSOLVE ONE TABLET UNDER THE TONGUE EVERY 5 MINUTES AS NEEDED FOR CHEST PAIN. DO NOT EXCEED A TOTAL OF 3 DOSES IN 15 MINUTES  . ONETOUCH  VERIO test strip FOR USE WHEN CHECKING BLOOD GLUCOSE ONCE DAILY ALTERNATING AM AND PM BEFORE MEALS FINGER STICK 90 DAYS  . Potassium Chloride ER 20 MEQ TBCR TAKE 1 TABLET BY MOUTH ONCE DAILY WITH FOOD  . rosuvastatin (CRESTOR) 5 MG tablet Take 5 mg by mouth daily.  [name] Kitchen spironolactone (ALDACTONE) 25 MG tablet Take 1 tablet (25 mg total) by mouth daily.  [name] Kitchen warfarin (COUMADIN) 3 MG tablet Take 3 mg by mouth daily at 6 PM. On Sunday 1.5 tablets  . gabapentin (NEURONTIN) 300 MG capsule TAKE 1 CAPSULE BY MOUTH EVERY NIGHT   No facility-administered encounter medications on file as of 01/06/2019.     Allergy:  Allergies  Allergen Reactions  . Shellfish Allergy Anaphylaxis    Social Hx:   Social History   Socioeconomic History  . Marital status: Widowed    Spouse name: Not on file  . Number of children: Not on file  . Years of education: Not on file  . Highest education level: Not on file  Occupational History  . Occupation: retired  Scientific laboratory technician  . Financial resource strain: Not on file  . Food insecurity  Worry: Not on file    Inability: Not on file  . Transportation needs    Medical: Not on file    Non-medical: Not on file  Tobacco Use  . Smoking status: Former Smoker    Years: 50.00    Types: Cigarettes    Quit date: 05/29/2009    Years since quitting: 9.6  . Smokeless tobacco: Never Used  Substance and Sexual Activity  . Alcohol use: Yes    Comment: rare  . Drug use: No  . Sexual activity: Not Currently  Lifestyle  . Physical activity    Days per week: Not on file    Minutes per session: Not on file  . Stress: Not on file  Relationships  . Social Herbalist on phone: Not on file    Gets together: Not on file    Attends religious service: Not on file    Active member of club or organization: Not on file    Attends meetings of clubs or organizations: Not on file    Relationship status: Not on file  . Intimate partner violence    Fear of current or ex  partner: Not on file    Emotionally abused: Not on file    Physically abused: Not on file    Forced sexual activity: Not on file  Other Topics Concern  . Not on file  Social History Narrative   Tobacco use cigarettes: former smoker, quit in year Sept 2011, Pack-year Hx: 48, tobacco history last updated 10/10/2013. No smoking. Per patient stopped smoking 9 months ago. No alcohol. Caffeine.: yes, 2+ servings daily, tea. No recreational drug use. Exercise: [employer] water [employer]. Marital status: widow.    Past Surgical Hx:  Past Surgical History:  Procedure Laterality Date  . ABDOMINAL HYSTERECTOMY    . BREAST BIOPSY Right 09/04/2012  . BREAST LUMPECTOMY Right 10/08/2012  . CLIPPING OF ATRIAL APPENDAGE N/A 05/09/2018   Procedure: CLIPPING OF ATRIAL APPENDAGE USING ATRICLIP PRO2 CLIP SIZE 45MM;  Surgeon: Rexene Alberts, MD;  Location: [place];  Service: Open Heart Surgery;  Laterality: N/A;  . COLONOSCOPY    . DILATION AND CURETTAGE OF UTERUS    . LEFT HEART CATHETERIZATION WITH CORONARY ANGIOGRAM N/A 09/14/2014   Procedure: LEFT HEART CATHETERIZATION WITH CORONARY ANGIOGRAM;  Surgeon: [name], MD;  Location: [facility] CATH LAB;  Service: Cardiovascular;  Laterality: N/A;  . MAZE N/A 05/09/2018   Procedure: MAZE;  Surgeon: Rexene Alberts, MD;  Location: [place];  Service: Open Heart Surgery;  Laterality: N/A;  . MITRAL VALVE REPAIR N/A 05/09/2018   Procedure: MITRAL VALVE REPAIR (MVR) USING MEMO 4D RING SIZE 26MM;  Surgeon: Rexene Alberts, MD;  Location: [place];  Service: Open Heart Surgery;  Laterality: N/A;  . OPEN REDUCTION INTERNAL FIXATION (ORIF) PROXIMAL PHALANX Right 01/04/2016   Procedure: OPEN TREATMENT OF RIGHT THUMB PROXIMAL PHALANX FRACTURE;  Surgeon: Milly Jakob, MD;  Location: [place];  Service: Orthopedics;  Laterality: Right;  . PARTIAL MASTECTOMY WITH NEEDLE LOCALIZATION AND AXILLARY SENTINEL LYMPH NODE BX Right 10/08/2012   Procedure: RIGHT PARTIAL MASTECTOMY  WITH NEEDLE LOCALIZATION AND RIGHT AXILLARY SENTINEL LYMPH NODE BX;  Surgeon: Adin Hector, MD;  Location: [place];  Service: General;  Laterality: Right;  Injection of 1% Methylene Blue dye into right breast  . PORTACATH PLACEMENT Left 10/08/2012   Procedure: INSERTION PORT-A-CATH WITH ULTRASOUND GUIDANCE;  Surgeon: Adin Hector, MD;  Location: [place];  Service: General;  Laterality: Left;  Using 4F Powerport [place]  . Posterior Lumbar Arthrodesis, Pedicle screw fixation, Posterolateral Arthodesis  03/17/11  . RIGHT/LEFT HEART CATH AND CORONARY ANGIOGRAPHY N/A 05/02/2018   Procedure: RIGHT/LEFT HEART CATH AND CORONARY ANGIOGRAPHY;  Surgeon: [name], MD;  Location: [place] CV LAB;  Service: Cardiovascular;  Laterality: N/A;  . SHOULDER ARTHROSCOPY WITH ROTATOR CUFF REPAIR AND SUBACROMIAL DECOMPRESSION  06/04/2012   Procedure: SHOULDER ARTHROSCOPY WITH ROTATOR CUFF REPAIR AND SUBACROMIAL DECOMPRESSION;  Surgeon: Nita Sells, MD;  Location: [place];  Service: Orthopedics;  Laterality: Left;  LEFT SHOULDER ARTHROSCOPY WITH ROTATOR CUFF REPAIR AND SUBACROMIAL DECOMPRESSION AND DISTAL CLAVICLE RESECTION  . TEE WITHOUT CARDIOVERSION N/A 05/03/2018   Procedure: TRANSESOPHAGEAL ECHOCARDIOGRAM (TEE);  Surgeon: Dixie Dials, MD;  Location[facility] ENDOSCOPY;  Service: Cardiovascular;  Laterality: N/A;  . TEE WITHOUT CARDIOVERSION N/A 05/09/2018   Procedure: TRANSESOPHAGEAL ECHOCARDIOGRAM (TEE);  Surgeon: Rexene Alberts, MD;  Location: [place];  Service: Open Heart Surgery;  Laterality: N/A;  . TONSILLECTOMY    . TRICUSPID VALVE REPLACEMENT N/A 05/09/2018   Procedure: TRICUSPID VALVE REPAIR USING MC3 RING SIZE 28MM;  Surgeon: Rexene Alberts, MD;  Location: [place];  Service: Open Heart Surgery;  Laterality: N/A;  . TUBAL LIGATION    . VULVECTOMY PARTIAL    . Wide Local Excision  01/2010   Bilateral groin excisions, re-excision of vulva    Past Medical Hx:  Past  Medical History:  Diagnosis Date  . Anxiety   . Arthritis    back  . Atrial fibrillation ([place])   . Breast cancer ([place])    right breast cancer - 3 years ago  . Chronic diastolic heart failure (HCC)    ECHO: 06/20/2017 - Grade 2 Diastolic Dysfunction   . Coronary artery disease   . Depression   . Diabetes mellitus without complication (HCC)    diet  . Dysrhythmia    afib  . Fibromyalgia   . Herniated disc   . Hyperlipemia   . Hypertension   . Iron deficiency anemia   . Longstanding persistent atrial fibrillation   . Lower back pain   . Lumbar stenosis    L4-5  . Personal history of chemotherapy   . Personal history of radiation therapy   . Pulmonary hypertension ([place])   . S/P Maze operation for atrial fibrillation 05/09/2018   Complete bilateral atrial lesion set using bipolar radiofrequency and cryothermy ablation with clipping of LA appendage  . 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 S/P radiation therapy 01/23/2013-03/06/2013   Right breast / 46 Gy in 23 fractions / Right breast boost / 14 Gy in 7 fractions  . 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 Severe mitral valve regurgitation   . Spondylolysis   . 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 Status post chemotherapy 11/04/2012 - 01/06/2013.   Docetaxel/Cytoxan Q 3 Weeks x 4 cycles  . Torn rotator cuff   . Tricuspid regurgitation     Past Gynecological History:  See HPI. Hx of vulvectomy for vulvar cancer 2011. No LMP recorded. Patient has had a hysterectomy.  Family Hx:  Family History  Problem Relation Age of Onset  . Stroke Mother   . Alzheimer's disease Mother   . Heart attack Father   . Heart attack Brother   . Breast cancer Neg  Hx     Review of Systems:  Constitutional  Feels well,   ENT Normal appearing ears and nares bilaterally Skin/Breast  No rash, sores, jaundice, itching,  dryness Cardiovascular  No chest pain, shortness of breath, or edema  Pulmonary  No cough or wheeze.  Gastro Intestinal  No nausea, vomitting, or diarrhoea. No bright red blood per rectum, no abdominal pain, change in bowel movement, or constipation.  Genito Urinary  No frequency, urgency, dysuria,  Musculo Skeletal  + left inguinal pain with elevation of the leg Neurologic  No weakness, numbness, change in gait,  Psychology  No depression, anxiety, insomnia.   Vitals:  Blood pressure (!) 151/63, pulse 72, temperature 98.5 F (36.9 C), temperature source Oral, resp. rate 20, height 5' 7.5" (1.715 m), weight 201 lb (91.2 kg), SpO2 100 %.  Physical Exam: WD in NAD Neck  Supple NROM, without any enlargements.  Lymph Node Survey No cervical supraclavicular or inguinal adenopathy Cardiovascular  Pulse normal rate, regularity and rhythm. S1 and S2 normal.  Lungs  Clear to auscultation bilateraly, without wheezes/crackles/rhonchi. Good air movement.  Skin  No rash/lesions/breakdown  Psychiatry  Alert and oriented to person, place, and time  Abdomen  Normoactive bowel sounds, abdomen soft, non-tender and obese without evidence of hernia.  Back No CVA tenderness Genito Urinary  Vulva/vagina: Normal external female genitalia.  There is a fleshy, exophytic 2 cm lesion at the posterior left introitus encroaching upon extending into the most distal portion of the vagina.  It is closely abutting the anal sphincter.  It is mobile and not fixed.  Application of acetic acid reveals no extension of VIN 3 onto the surrounding skin or into the vagina.  Bladder/urethra:  No lesions or masses, well supported bladder  Vagina: grossly normal and free of lesions  Cervix and uterus surgically absent  Adnexa: no palpable masses. Rectal  Good tone, no masses no cul de sac nodularity.  Extremities  No bilateral cyanosis, clubbing or edema.   Thereasa Solo, MD  01/06/2019, 12:08 PM

## 2019-01-08 ENCOUNTER — Other Ambulatory Visit: Payer: Self-pay | Admitting: Gynecologic Oncology

## 2019-01-08 ENCOUNTER — Ambulatory Visit (HOSPITAL_COMMUNITY)
Admission: RE | Admit: 2019-01-08 | Discharge: 2019-01-08 | Disposition: A | Discharge status: Home / Self Care | Payer: Medicare Other | Source: Ambulatory Visit | Attending: Gynecologic Oncology | Admitting: Gynecologic Oncology

## 2019-01-08 ENCOUNTER — Telehealth: Payer: Self-pay | Admitting: Oncology

## 2019-01-08 DIAGNOSIS — C519 Malignant neoplasm of vulva, unspecified: Secondary | ICD-10-CM | POA: Diagnosis present

## 2019-01-08 DIAGNOSIS — R1032 Left lower quadrant pain: Secondary | ICD-10-CM | POA: Diagnosis present

## 2019-01-08 NOTE — Telephone Encounter (Signed)
Called Dr. Zenia Resides office regarding cardiac clearance and stopping coumadin 7 days before surgery with Lovenox bridge.  Faxed form to [phone].

## 2019-01-09 ENCOUNTER — Telehealth: Payer: Self-pay

## 2019-01-09 NOTE — Telephone Encounter (Signed)
ENCOUNTER OPENED IN ERROR

## 2019-01-13 ENCOUNTER — Telehealth: Payer: Self-pay | Admitting: Gynecologic Oncology

## 2019-01-13 NOTE — Telephone Encounter (Signed)
Called patient and discussed [place] results and HgbA1C results.  Advised her of Dr. Serita Grit plan for PET scan after she has healed from her vulvectomy.  All questions answered.  She states she would like to have surgery the week of February 03, 2019. Advised her that I would call her back with a date. Advised to call for any needs or concerns.

## 2019-01-20 ENCOUNTER — Telehealth: Payer: Self-pay | Admitting: Oncology

## 2019-01-20 NOTE — Telephone Encounter (Signed)
Called [place] and advised her that surgery has been scheduled for 02/06/19 and that she will need to take her last dose of pradaxa on 02/02/19 per Dr. Denman George.  She verbalized understanding and agreement.

## 2019-01-30 ENCOUNTER — Other Ambulatory Visit: Payer: Self-pay | Admitting: General Surgery

## 2019-01-30 DIAGNOSIS — N644 Mastodynia: Secondary | ICD-10-CM

## 2019-01-30 NOTE — Patient Instructions (Addendum)
DUE TO COVID-19 ONLY ONE VISITOR IS ALLOWED TO COME WITH YOU AND STAY IN THE WAITING ROOM ONLY DURING PRE OP AND PROCEDURE DAY OF SURGERY.  THE 1 VISITOR MAY VISIT WITH YOU AFTER SURGERY IN YOUR PRIVATE ROOM DURING VISITING HOURS ONLY!   YOU NEED TO HAVE A COVID 19 TEST ON__9/8_____ @__11 :20_____, THIS TEST MUST BE DONE BEFORE SURGERY, COME  [address] [place] , [id number].  ([place]) ONCE YOUR COVID TEST IS COMPLETED, PLEASE BEGIN THE QUARANTINE INSTRUCTIONS AS OUTLINED IN YOUR HANDOUT.                Diane [name]   Your procedure is scheduled on: 02/06/19   Report to [facility] Main  Entrance Report to admitting at    7:30 AM     Call this number if you have problems the morning of surgery [phone]    Remember: Do not eat food or drink liquids :After Midnight  . BRUSH YOUR TEETH MORNING OF SURGERY AND RINSE YOUR MOUTH OUT, NO CHEWING GUM CANDY OR MINTS.     Take these medicines the morning of surgery with A SIP OF WATER: Claritin  DO NOT TAKE ANY DIABETIC MEDICATIONS DAY OF YOUR SURGERY        How to Manage Your Diabetes Before and After Surgery  Why is it important to control my blood sugar before and after surgery? . Improving blood sugar levels before and after surgery helps healing and can limit problems. . A way of improving blood sugar control is eating a healthy diet by: o  Eating less sugar and carbohydrates o  Increasing activity/exercise o  Talking with your doctor about reaching your blood sugar goals . High blood sugars (greater than 180 mg/dL) can raise your risk of infections and slow your recovery, so you will need to focus on controlling your diabetes during the weeks before surgery. . Make sure that the doctor who takes care of your diabetes knows about your planned surgery including the date and location.  How do I manage my blood sugar before surgery? . Check your blood sugar at least 4 times a day,  starting 2 days before surgery, to make sure that the level is not too high or low. o Check your blood sugar the morning of your surgery when you wake up and every 2 hours until you get to the Short Stay unit. . If your blood sugar is less than 70 mg/dL, you will need to treat for low blood sugar: o Do not take insulin. o Treat a low blood sugar (less than 70 mg/dL) with  cup of clear juice (cranberry or apple), 4 glucose tablets, OR glucose gel. o Recheck blood sugar in 15 minutes after treatment (to make sure it is greater than 70 mg/dL). If your blood sugar is not greater than 70 mg/dL on recheck, call [phone] for further instructions. . Report your blood sugar to the short stay nurse when you get to Short Stay.  . If you are admitted to the hospital after surgery: o Your blood sugar will be checked by the staff and you will probably be given insulin after surgery (instead of oral diabetes medicines) to make sure you have good blood sugar levels. o The goal for blood sugar control after surgery is 80-180 mg/dL.   WHAT DO I DO ABOUT MY DIABETES MEDICATION?  [name] Kitchen Do not take oral diabetes medicines (pills) the morning of surge  You may not have any metal on your body including hair pins and              piercings             Do not wear jewelry, make-up, lotions, powders or perfumes, deodorant             Do not wear nail polish.  Do not shave  48 hours prior to surgery.           Do not bring valuables to the hospital. [place].  Contacts, dentures or bridgework may not be worn into surgery.      Patients discharged the day of surgery will not be allowed to drive home . IF YOU ARE HAVING SURGERY AND GOING HOME THE SAME DAY, YOU MUST HAVE AN ADULT TO DRIVE YOU HOME AND BE WITH YOU FOR 24 HOURS.  YOU MAY GO HOME BY TAXI OR UBER OR ORTHERWISE, BUT AN ADULT MUST ACCOMPANY YOU HOME AND STAY WITH YOU FOR 24  HOURS.  Name and phone number of your driver:  Special Instructions: N/A              Please read over the following fact sheets you were given: _____________________________________________________________________             [facility] - Preparing for Surgery  Before surgery, you can play an important role .  Because skin is not sterile, your skin needs to be as free of germs as possible.   You can reduce the number of germs on your skin by washing with CHG (chlorahexidine gluconate) soap before surgery. CHG is an antiseptic cleaner which kills germs and bonds with the skin o continue killing germs even after washing. Please DO NOT use if you have an allergy to CHG or antibacterial soaps.  If your skin becomes reddened/irritated stop using the CHG and inform your nurse when you arrive at Short Stay. Do not shave (including legs and underarms) for at least 48 hours prior to the first CHG shower.  . Please follow these instructions carefully:  1.  Shower with CHG Soap the night before surgery and the  morning of Surgery.  2.  If you choose to wash your hair, wash your hair first as usual with your  normal  shampoo.  3.  After you shampoo, rinse your hair and body thoroughly to remove the  shampoo.                                        4.  Use CHG as you would any other liquid soap.  You can apply chg directly  to the skin and wash                       Gently with a scrungie or clean washcloth.  5.  Apply the CHG Soap to your body ONLY FROM THE NECK DOWN.   Do not use on face/ open                           Wound or open sores. Avoid contact with eyes, ears mouth and genitals (private parts).  Wash face,  Genitals (private parts) with your normal soap.             6.  Wash thoroughly, paying special attention to the area where your surgery  will be performed.  7.  Thoroughly rinse your body with warm water from the neck down.  8.  DO NOT shower/wash with your  normal soap after using and rinsing off  the CHG Soap.             9.  Pat yourself dry with a clean towel.            10.  Wear clean pajamas.            11.  Place clean sheets on your bed the night of your first shower and do not  sleep with pets.  Day of Surgery : Do not apply any lotions/deodorants the morning of surgery.  Please wear clean clothes to the hospital/surgery center.   FAILURE TO FOLLOW THESE INSTRUCTIONS MAY RESULT IN THE CANCELLATION OF YOUR SURGERY PATIENT SIGNATURE_________________________________  NURSE SIGNATURE__________________________________  ________________________________________________________________________   Adam Phenix  An incentive spirometer is a tool that can help keep your lungs clear and active. This tool measures how well you are filling your lungs with each breath. Taking long deep breaths may help reverse or decrease the chance of developing breathing (pulmonary) problems (especially infection) following:  A long period of time when you are unable to move or be active. BEFORE THE PROCEDURE   If the spirometer includes an indicator to show your best effort, your nurse or respiratory therapist will set it to a desired goal.  If possible, sit up straight or lean slightly forward. Try not to slouch.  Hold the incentive spirometer in an upright position. INSTRUCTIONS FOR USE  1. Sit on the edge of your bed if possible, or sit up as far as you can in bed or on a chair. 2. Hold the incentive spirometer in an upright position. 3. Breathe out normally. 4. Place the mouthpiece in your mouth and seal your lips tightly around it. 5. Breathe in slowly and as deeply as possible, raising the piston or the ball toward the top of the column. 6. Hold your breath for 3-5 seconds or for as long as possible. Allow the piston or ball to fall to the bottom of the column. 7. Remove the mouthpiece from your mouth and breathe out normally. 8. Rest for  a few seconds and repeat Steps 1 through 7 at least 10 times every 1-2 hours when you are awake. Take your time and take a few normal breaths between deep breaths. 9. The spirometer may include an indicator to show your best effort. Use the indicator as a goal to work toward during each repetition. 10. After each set of 10 deep breaths, practice coughing to be sure your lungs are clear. If you have an incision (the cut made at the time of surgery), support your incision when coughing by placing a pillow or rolled up towels firmly against it. Once you are able to get out of bed, walk around indoors and cough well. You may stop using the incentive spirometer when instructed by your caregiver.  RISKS AND COMPLICATIONS  Take your time so you do not get dizzy or light-headed.  If you are in pain, you may need to take or ask for pain medication before doing incentive spirometry. It is harder to take a deep breath if you are having pain.  AFTER USE  Rest and breathe slowly and easily.  It can be helpful to keep track of a log of your progress. Your caregiver can provide you with a simple table to help with this. If you are using the spirometer at home, follow these instructions: [place] IF:   You are having difficultly using the spirometer.  You have trouble using the spirometer as often as instructed.  Your pain medication is not giving enough relief while using the spirometer.  You develop fever of 100.5 F (38.1 C) or higher. SEEK IMMEDIATE MEDICAL CARE IF:   You cough up bloody sputum that had not been present before.  You develop fever of 102 F (38.9 C) or greater.  You develop worsening pain at or near the incision site. MAKE SURE YOU:   Understand these instructions.  Will watch your condition.  Will get help right away if you are not doing well or get worse. Document Released: 09/25/2006 Document Revised: 08/07/2011 Document Reviewed: 11/26/2006 [facility] Patient  Information 2014 [place], [place].   ________________________________________________________________________

## 2019-01-31 ENCOUNTER — Encounter (HOSPITAL_COMMUNITY): Payer: Self-pay

## 2019-01-31 ENCOUNTER — Encounter (HOSPITAL_COMMUNITY)
Admission: RE | Admit: 2019-01-31 | Discharge: 2019-01-31 | Disposition: A | Discharge status: Home / Self Care | Payer: Medicare Other | Source: Ambulatory Visit | Attending: Gynecologic Oncology | Admitting: Gynecologic Oncology

## 2019-01-31 ENCOUNTER — Other Ambulatory Visit: Payer: Self-pay

## 2019-01-31 DIAGNOSIS — Z79899 Other long term (current) drug therapy: Secondary | ICD-10-CM | POA: Insufficient documentation

## 2019-01-31 DIAGNOSIS — D509 Iron deficiency anemia, unspecified: Secondary | ICD-10-CM | POA: Insufficient documentation

## 2019-01-31 DIAGNOSIS — Z7982 Long term (current) use of aspirin: Secondary | ICD-10-CM | POA: Diagnosis not present

## 2019-01-31 DIAGNOSIS — Z01818 Encounter for other preprocedural examination: Secondary | ICD-10-CM | POA: Insufficient documentation

## 2019-01-31 DIAGNOSIS — E118 Type 2 diabetes mellitus with unspecified complications: Secondary | ICD-10-CM | POA: Insufficient documentation

## 2019-01-31 DIAGNOSIS — F329 Major depressive disorder, single episode, unspecified: Secondary | ICD-10-CM | POA: Diagnosis not present

## 2019-01-31 DIAGNOSIS — I4891 Unspecified atrial fibrillation: Secondary | ICD-10-CM | POA: Diagnosis not present

## 2019-01-31 DIAGNOSIS — Z7984 Long term (current) use of oral hypoglycemic drugs: Secondary | ICD-10-CM | POA: Insufficient documentation

## 2019-01-31 DIAGNOSIS — D071 Carcinoma in situ of vulva: Secondary | ICD-10-CM | POA: Diagnosis not present

## 2019-01-31 DIAGNOSIS — E785 Hyperlipidemia, unspecified: Secondary | ICD-10-CM | POA: Diagnosis not present

## 2019-01-31 DIAGNOSIS — I5032 Chronic diastolic (congestive) heart failure: Secondary | ICD-10-CM | POA: Insufficient documentation

## 2019-01-31 DIAGNOSIS — M4690 Unspecified inflammatory spondylopathy, site unspecified: Secondary | ICD-10-CM | POA: Insufficient documentation

## 2019-01-31 DIAGNOSIS — M797 Fibromyalgia: Secondary | ICD-10-CM | POA: Insufficient documentation

## 2019-01-31 DIAGNOSIS — Z87891 Personal history of nicotine dependence: Secondary | ICD-10-CM | POA: Diagnosis not present

## 2019-01-31 DIAGNOSIS — F419 Anxiety disorder, unspecified: Secondary | ICD-10-CM | POA: Diagnosis not present

## 2019-01-31 DIAGNOSIS — I110 Hypertensive heart disease with heart failure: Secondary | ICD-10-CM | POA: Diagnosis not present

## 2019-01-31 LAB — CBC
HCT: 38.3 % (ref 36.0–46.0)
Hemoglobin: 11.8 g/dL — ABNORMAL LOW (ref 12.0–15.0)
MCH: 27.6 pg (ref 26.0–34.0)
MCHC: 30.8 g/dL (ref 30.0–36.0)
MCV: 89.5 fL (ref 80.0–100.0)
Platelets: 198 10*3/uL (ref 150–400)
RBC: 4.28 MIL/uL (ref 3.87–5.11)
RDW: 14.8 % (ref 11.5–15.5)
WBC: 8.6 10*3/uL (ref 4.0–10.5)
nRBC: 0 % (ref 0.0–0.2)

## 2019-01-31 LAB — COMPREHENSIVE METABOLIC PANEL
ALT: 15 U/L (ref 0–44)
AST: 23 U/L (ref 15–41)
Albumin: 4.2 g/dL (ref 3.5–5.0)
Alkaline Phosphatase: 100 U/L (ref 38–126)
Anion gap: 10 (ref 5–15)
BUN: 46 mg/dL — ABNORMAL HIGH (ref 8–23)
CO2: 24 mmol/L (ref 22–32)
Calcium: 8.8 mg/dL — ABNORMAL LOW (ref 8.9–10.3)
Chloride: 102 mmol/L (ref 98–111)
Creatinine, Ser: 1.64 mg/dL — ABNORMAL HIGH (ref 0.44–1.00)
GFR calc Af Amer: 35 mL/min — ABNORMAL LOW (ref >60)
GFR calc non Af Amer: 30 mL/min — ABNORMAL LOW (ref >60)
Glucose, Bld: 96 mg/dL (ref 70–99)
Potassium: 4.4 mmol/L (ref 3.5–5.1)
Sodium: 136 mmol/L (ref 135–145)
Total Bilirubin: 0.7 mg/dL (ref 0.3–1.2)
Total Protein: 8.3 g/dL — ABNORMAL HIGH (ref 6.5–8.1)

## 2019-01-31 LAB — HEMOGLOBIN A1C
Hgb A1c MFr Bld: 6.3 % — ABNORMAL HIGH (ref 4.8–5.6)
Mean Plasma Glucose: 134.11 mg/dL

## 2019-01-31 LAB — GLUCOSE, CAPILLARY: Glucose-Capillary: 81 mg/dL (ref 70–99)

## 2019-01-31 LAB — ABO/RH: ABO/RH(D): B POS

## 2019-01-31 NOTE — Progress Notes (Signed)
PCP - Dr. Felipa Eth Cardiologist - Dr. Terrence Dupont  Chest x-ray - 06/03/18 EKG - 01/31/19 Stress Test - no ECHO - 05/16/18 Cardiac Cath - 05/02/18  Sleep Study - no CPAP - no  Fasting Blood Sugar - 90's to 100's Checks Blood Sugar __1___ times a day  Blood Thinner Instructions:Dr. Harwani told the patient Aspirin Instructions:ASA stop 5 days, Paradaxa stop 3 days before surgery Last Dose:Pt states that she will stop the ASA on 02/01/19 and Paradaxa 02/03/19  Anesthesia review:   Patient denies shortness of breath, fever, cough and chest pain at PAT appointment yes  Patient verbalized understanding of instructions that were given to them at the PAT appointment. Patient was also instructed that they will need to review over the PAT instructions again at home before surgery. yes

## 2019-02-04 ENCOUNTER — Other Ambulatory Visit (HOSPITAL_COMMUNITY)
Admission: RE | Admit: 2019-02-04 | Discharge: 2019-02-04 | Disposition: A | Discharge status: Home / Self Care | Payer: Medicare Other | Source: Ambulatory Visit | Attending: Gynecologic Oncology | Admitting: Gynecologic Oncology

## 2019-02-04 ENCOUNTER — Other Ambulatory Visit: Payer: Self-pay

## 2019-02-04 DIAGNOSIS — Z20828 Contact with and (suspected) exposure to other viral communicable diseases: Secondary | ICD-10-CM | POA: Diagnosis not present

## 2019-02-04 DIAGNOSIS — Z01812 Encounter for preprocedural laboratory examination: Secondary | ICD-10-CM | POA: Insufficient documentation

## 2019-02-04 LAB — SARS CORONAVIRUS 2 (TAT 6-24 HRS): SARS Coronavirus 2: NEGATIVE

## 2019-02-05 ENCOUNTER — Ambulatory Visit
Admission: RE | Admit: 2019-02-05 | Discharge: 2019-02-05 | Disposition: A | Discharge status: Home / Self Care | Payer: Medicare Other | Source: Ambulatory Visit | Attending: General Surgery | Admitting: General Surgery

## 2019-02-05 DIAGNOSIS — N644 Mastodynia: Secondary | ICD-10-CM

## 2019-02-05 NOTE — Progress Notes (Signed)
Anesthesia Chart Review   Case: [id number] Date/Time: 02/06/19 0915   Procedure: LEFT VULVECTOMY (Left )   Anesthesia type: General   Pre-op diagnosis: VULVAR DYSPLASIA   Location: [place] 02 / [facility] ORS   Surgeon: Everitt Amber, MD      DISCUSSION:78 y.o. former smoker (quit 05/29/09) with h/o HTN, DM II, renal insufficiency, A-fib (s/p MAZE 04/2018), chronic diastolic heart failure, s/p mitral valve repair and tricuspid valve repair 05/09/2018, vulvar dysplasia scheduled for above procedure 02/06/2019 with Dr. Katha Hamming.   Pt advised by cardiologist to stop ASA 5 days prior to surgery and Pradaxa 3 days prior to surgery.  Last dose ASA 02/01/2019, Pradaxa 02/03/2019.  Last seen in Afib clinic 11/18/2018.  Stable at this visit s/p MAZE 04/2018 without recurrence.  1 year follow up recommended.    Last seen by cardiothoracic surgeon, Dr. Darylene Price, 08/12/2018.  Per OV note patient stable with no medication changes made.  1 year follow up recommended.   Discussed with Dr. Sabra Heck.  Anticipate pt can proceed with planned procedure barring acute status change.   VS: There were no vitals taken for this visit.  PROVIDERS: Lajean Manes, MD is PCP   Terrence Dupont, MD is Cardiologist   Darylene Price, MD is Cardiothoracic surgeon  LABS: Labs reviewed: Acceptable for surgery. (all labs ordered are listed, but only abnormal results are displayed)  Labs Reviewed  CBC - Abnormal; Notable for the following components:      Result Value   Hemoglobin 11.8 (*)    All other components within normal limits  COMPREHENSIVE METABOLIC PANEL - Abnormal; Notable for the following components:   BUN 46 (*)    Creatinine, Ser 1.64 (*)    Calcium 8.8 (*)    Total Protein 8.3 (*)    GFR calc non Af Amer 30 (*)    GFR calc Af Amer 35 (*)    All other components within normal limits  HEMOGLOBIN A1C - Abnormal; Notable for the following components:   Hgb A1c MFr Bld 6.3 (*)    All other components within normal limits   GLUCOSE, CAPILLARY  TYPE AND SCREEN  ABO/RH     IMAGES:   EKG: 01/31/2019 Rate 65 bpm Sinus rhythm  Nonspecific T wave abnormality Abnormal ECG No previous tracing   CV: Echo 05/16/18 Study Conclusions  - Left ventricle: The cavity size was normal. Systolic function was   normal. The estimated ejection fraction was in the range of 55%   to 60%. Wall motion was normal; there were no regional wall   motion abnormalities. Doppler parameters are consistent with   abnormal left ventricular relaxation (grade 1 diastolic   dysfunction). - Aortic valve: There was mild to moderate regurgitation. Valve   area (VTI): 2.11 cm^2. Valve area (Vmax): 1.99 cm^2. Valve area   (Vmean): 2.15 cm^2. - Mitral valve: Valve area by continuity equation (using LVOT   flow): 1.91 cm^2. - Left atrium: The atrium was mildly dilated. - Atrial septum: No defect or patent foramen ovale was identified. - Pulmonic valve: Peak gradient (S): 12 mm Hg.  Cardiac Cath 05/02/18  Prox LAD lesion is 30% stenosed.  Prox RCA lesion is 40% stenosed.  Mid RCA lesion is 15% stenosed.  Ost 1st Diag to 1st Diag lesion is 40% stenosed.  Hemodynamic findings consistent with severe pulmonary hypertension and mitral valve regurgitation. Past Medical History:  Diagnosis Date  . Anxiety   . Arthritis    back  . Atrial  fibrillation ([place])   . Breast cancer ([place])    right breast cancer - 3 years ago  . Chronic diastolic heart failure (HCC)    ECHO: 06/20/2017 - Grade 2 Diastolic Dysfunction   . Coronary artery disease   . Depression   . Diabetes mellitus without complication (HCC)    diet  . Dysrhythmia    afib  . Fibromyalgia   . Herniated disc   . Hyperlipemia   . Hypertension   . Iron deficiency anemia   . Longstanding persistent atrial fibrillation   . Lower back pain   . Lumbar stenosis    L4-5  . Personal history of chemotherapy   . Personal history of radiation therapy   . Pulmonary  hypertension ([place])   . S/P Maze operation for atrial fibrillation 05/09/2018   Complete bilateral atrial lesion set using bipolar radiofrequency and cryothermy ablation with clipping of LA appendage  . 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 S/P radiation therapy 01/23/2013-03/06/2013   Right breast / 46 Gy in 23 fractions / Right breast boost / 14 Gy in 7 fractions  . 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 Severe mitral valve regurgitation   . Spondylolysis   . 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 Status post chemotherapy 11/04/2012 - 01/06/2013.   Docetaxel/Cytoxan Q 3 Weeks x 4 cycles  . Torn rotator cuff   . Tricuspid regurgitation     Past Surgical History:  Procedure Laterality Date  . ABDOMINAL HYSTERECTOMY    . BREAST BIOPSY Right 09/04/2012  . BREAST LUMPECTOMY Right 10/08/2012  . CLIPPING OF ATRIAL APPENDAGE N/A 05/09/2018   Procedure: CLIPPING OF ATRIAL APPENDAGE USING ATRICLIP PRO2 CLIP SIZE 45MM;  Surgeon: Rexene Alberts, MD;  Location: [place];  Service: Open Heart Surgery;  Laterality: N/A;  . COLONOSCOPY    . DILATION AND CURETTAGE OF UTERUS    . LEFT HEART CATHETERIZATION WITH CORONARY ANGIOGRAM N/A 09/14/2014   Procedure: LEFT HEART CATHETERIZATION WITH CORONARY ANGIOGRAM;  Surgeon: [name], MD;  Location: [facility] CATH LAB;  Service: Cardiovascular;  Laterality: N/A;  . MAZE N/A 05/09/2018   Procedure: MAZE;  Surgeon: Rexene Alberts, MD;  Location: [place];  Service: Open Heart Surgery;  Laterality: N/A;  . MITRAL VALVE REPAIR N/A 05/09/2018   Procedure: MITRAL VALVE REPAIR (MVR) USING MEMO 4D RING SIZE 26MM;  Surgeon: Rexene Alberts, MD;  Location: [place];  Service: Open Heart Surgery;  Laterality: N/A;  . OPEN REDUCTION INTERNAL FIXATION (ORIF) PROXIMAL PHALANX Right 01/04/2016   Procedure: OPEN TREATMENT OF RIGHT THUMB PROXIMAL PHALANX FRACTURE;  Surgeon:  Milly Jakob, MD;  Location: [place];  Service: Orthopedics;  Laterality: Right;  . PARTIAL MASTECTOMY WITH NEEDLE LOCALIZATION AND AXILLARY SENTINEL LYMPH NODE BX Right 10/08/2012   Procedure: RIGHT PARTIAL MASTECTOMY WITH NEEDLE LOCALIZATION AND RIGHT AXILLARY SENTINEL LYMPH NODE BX;  Surgeon: Adin Hector, MD;  Location: [place];  Service: General;  Laterality: Right;  Injection of 1% Methylene Blue dye into right breast  . PORTACATH PLACEMENT Left 10/08/2012   Procedure: INSERTION PORT-A-CATH WITH ULTRASOUND GUIDANCE;  Surgeon: Adin Hector, MD;  Location: [place];  Service: General;  Laterality: Left;  Using 28F [place]  . Posterior Lumbar Arthrodesis, Pedicle screw fixation, Posterolateral Arthodesis  03/17/11  . RIGHT/LEFT HEART CATH AND  CORONARY ANGIOGRAPHY N/A 05/02/2018   Procedure: RIGHT/LEFT HEART CATH AND CORONARY ANGIOGRAPHY;  Surgeon: [name], MD;  Location: [facility] CV LAB;  Service: Cardiovascular;  Laterality: N/A;  . SHOULDER ARTHROSCOPY WITH ROTATOR CUFF REPAIR AND SUBACROMIAL DECOMPRESSION  06/04/2012   Procedure: SHOULDER ARTHROSCOPY WITH ROTATOR CUFF REPAIR AND SUBACROMIAL DECOMPRESSION;  Surgeon: Nita Sells, MD;  Location: [place];  Service: Orthopedics;  Laterality: Left;  LEFT SHOULDER ARTHROSCOPY WITH ROTATOR CUFF REPAIR AND SUBACROMIAL DECOMPRESSION AND DISTAL CLAVICLE RESECTION  . TEE WITHOUT CARDIOVERSION N/A 05/03/2018   Procedure: TRANSESOPHAGEAL ECHOCARDIOGRAM (TEE);  Surgeon: Dixie Dials, MD;  Location: [facility] ENDOSCOPY;  Service: Cardiovascular;  Laterality: N/A;  . TEE WITHOUT CARDIOVERSION N/A 05/09/2018   Procedure: TRANSESOPHAGEAL ECHOCARDIOGRAM (TEE);  Surgeon: Rexene Alberts, MD;  Location: [place];  Service: Open Heart Surgery;  Laterality: N/A;  . TONSILLECTOMY    . TRICUSPID VALVE REPLACEMENT N/A 05/09/2018   Procedure: TRICUSPID VALVE REPAIR USING MC3 RING SIZE 28MM;  Surgeon: Rexene Alberts, MD;  Location: [place];  Service: Open Heart Surgery;  Laterality: N/A;  . TUBAL LIGATION    . VULVECTOMY PARTIAL    . Wide Local Excision  01/2010   Bilateral groin excisions, re-excision of vulva    MEDICATIONS: . acetaminophen (TYLENOL) 500 MG tablet  . aspirin EC 81 MG tablet  . busPIRone (BUSPAR) 5 MG tablet  . dabigatran (PRADAXA) 75 MG CAPS capsule  . furosemide (LASIX) 40 MG tablet  . gabapentin (NEURONTIN) 300 MG capsule  . Lancets (ONETOUCH DELICA PLUS [id number]) MISC  . loratadine (CLARITIN) 10 MG tablet  . metFORMIN (GLUCOPHAGE-XR) 500 MG 24 hr tablet  . nitroGLYCERIN (NITROSTAT) 0.4 MG SL tablet  . ONETOUCH VERIO test strip  . Potassium Chloride ER 20 MEQ TBCR  . rosuvastatin (CRESTOR) 5 MG tablet  . spironolactone (ALDACTONE) 25 MG tablet  . tetrahydrozoline 0.05 % ophthalmic solution  . traZODone (DESYREL) 50 MG tablet   No current facility-administered medications for this encounter.     Maia Plan [facility] Pre-Surgical Testing [phone] 02/05/19 12:58 PM

## 2019-02-05 NOTE — Anesthesia Preprocedure Evaluation (Addendum)
Anesthesia Evaluation  Patient identified by MRN, date of birth, ID band Patient awake    Reviewed: Allergy & Precautions, NPO status , Patient's Chart, lab work & pertinent test results  History of Anesthesia Complications Negative for: history of anesthetic complications  Airway Mallampati: II  TM Distance: >3 FB Neck ROM: Full    Dental  (+) Dental Advisory Given, Missing   Pulmonary former smoker,      Pulmonary exam normal        Cardiovascular hypertension, Pt. on medications (-) angina+ CAD and + Peripheral Vascular Disease  Normal cardiovascular exam+ dysrhythmias Atrial Fibrillation + Valvular Problems/Murmurs (s/p TV and MV repair) AI    '19 TTE - EF 55% to 60%. Grade 1 diastolic dysfunction. Mild-moderate AI. LA was mildly dilated.    Neuro/Psych  Headaches, PSYCHIATRIC DISORDERS Anxiety Depression    GI/Hepatic negative GI ROS, Neg liver ROS,   Endo/Other  diabetes, Type 2, Oral Hypoglycemic Agents Obesity   Renal/GU CRFRenal disease     Musculoskeletal  (+) Arthritis , Fibromyalgia - Chronic back pain    Abdominal   Peds  Hematology negative hematology ROS (+)   Anesthesia Other Findings   Reproductive/Obstetrics  Breast cancer                            Anesthesia Physical Anesthesia Plan  ASA: III  Anesthesia Plan: General   Post-op Pain Management:    Induction: Intravenous  PONV Risk Score and Plan: 3 and Treatment may vary due to age or medical condition, Ondansetron and Dexamethasone  Airway Management Planned: LMA  Additional Equipment: None  Intra-op Plan:   Post-operative Plan: Extubation in OR  Informed Consent: I have reviewed the patients History and Physical, chart, labs and discussed the procedure including the risks, benefits and alternatives for the proposed anesthesia with the patient or authorized representative who has indicated  his/her understanding and acceptance.     Dental advisory given  Plan Discussed with: CRNA and Anesthesiologist  Anesthesia Plan Comments:       Anesthesia Quick Evaluation

## 2019-02-06 ENCOUNTER — Ambulatory Visit (HOSPITAL_COMMUNITY): Payer: Medicare Other | Admitting: Physician Assistant

## 2019-02-06 ENCOUNTER — Ambulatory Visit (HOSPITAL_COMMUNITY): Payer: Medicare Other | Admitting: Certified Registered Nurse Anesthetist

## 2019-02-06 ENCOUNTER — Encounter (HOSPITAL_COMMUNITY)
Admission: RE | Disposition: A | Discharge status: Home / Self Care | Payer: Self-pay | Source: Home / Self Care | Attending: Gynecologic Oncology

## 2019-02-06 ENCOUNTER — Ambulatory Visit (HOSPITAL_COMMUNITY)
Admission: RE | Admit: 2019-02-06 | Discharge: 2019-02-06 | Disposition: A | Discharge status: Home / Self Care | Payer: Medicare Other | Attending: Gynecologic Oncology | Admitting: Gynecologic Oncology

## 2019-02-06 ENCOUNTER — Encounter (HOSPITAL_COMMUNITY): Payer: Self-pay | Admitting: *Deleted

## 2019-02-06 ENCOUNTER — Other Ambulatory Visit: Payer: Self-pay

## 2019-02-06 DIAGNOSIS — D071 Carcinoma in situ of vulva: Secondary | ICD-10-CM

## 2019-02-06 DIAGNOSIS — Z923 Personal history of irradiation: Secondary | ICD-10-CM | POA: Diagnosis not present

## 2019-02-06 DIAGNOSIS — I2510 Atherosclerotic heart disease of native coronary artery without angina pectoris: Secondary | ICD-10-CM | POA: Diagnosis not present

## 2019-02-06 DIAGNOSIS — Z87891 Personal history of nicotine dependence: Secondary | ICD-10-CM | POA: Diagnosis not present

## 2019-02-06 DIAGNOSIS — I110 Hypertensive heart disease with heart failure: Secondary | ICD-10-CM | POA: Insufficient documentation

## 2019-02-06 DIAGNOSIS — Z952 Presence of prosthetic heart valve: Secondary | ICD-10-CM | POA: Insufficient documentation

## 2019-02-06 DIAGNOSIS — F419 Anxiety disorder, unspecified: Secondary | ICD-10-CM | POA: Diagnosis not present

## 2019-02-06 DIAGNOSIS — E785 Hyperlipidemia, unspecified: Secondary | ICD-10-CM | POA: Diagnosis not present

## 2019-02-06 DIAGNOSIS — Z90710 Acquired absence of both cervix and uterus: Secondary | ICD-10-CM | POA: Diagnosis not present

## 2019-02-06 DIAGNOSIS — Z7982 Long term (current) use of aspirin: Secondary | ICD-10-CM | POA: Insufficient documentation

## 2019-02-06 DIAGNOSIS — Z7901 Long term (current) use of anticoagulants: Secondary | ICD-10-CM | POA: Diagnosis not present

## 2019-02-06 DIAGNOSIS — I4891 Unspecified atrial fibrillation: Secondary | ICD-10-CM | POA: Insufficient documentation

## 2019-02-06 DIAGNOSIS — I5032 Chronic diastolic (congestive) heart failure: Secondary | ICD-10-CM | POA: Insufficient documentation

## 2019-02-06 DIAGNOSIS — Z9221 Personal history of antineoplastic chemotherapy: Secondary | ICD-10-CM | POA: Insufficient documentation

## 2019-02-06 DIAGNOSIS — C519 Malignant neoplasm of vulva, unspecified: Secondary | ICD-10-CM | POA: Insufficient documentation

## 2019-02-06 DIAGNOSIS — Z7984 Long term (current) use of oral hypoglycemic drugs: Secondary | ICD-10-CM | POA: Insufficient documentation

## 2019-02-06 DIAGNOSIS — E669 Obesity, unspecified: Secondary | ICD-10-CM | POA: Insufficient documentation

## 2019-02-06 DIAGNOSIS — Z79899 Other long term (current) drug therapy: Secondary | ICD-10-CM | POA: Insufficient documentation

## 2019-02-06 DIAGNOSIS — Z6831 Body mass index (BMI) 31.0-31.9, adult: Secondary | ICD-10-CM | POA: Insufficient documentation

## 2019-02-06 DIAGNOSIS — E1151 Type 2 diabetes mellitus with diabetic peripheral angiopathy without gangrene: Secondary | ICD-10-CM | POA: Insufficient documentation

## 2019-02-06 DIAGNOSIS — Z853 Personal history of malignant neoplasm of breast: Secondary | ICD-10-CM | POA: Diagnosis not present

## 2019-02-06 HISTORY — PX: VULVECTOMY: SHX1086

## 2019-02-06 LAB — TYPE AND SCREEN
ABO/RH(D): B POS
Antibody Screen: NEGATIVE

## 2019-02-06 LAB — GLUCOSE, CAPILLARY
Glucose-Capillary: 84 mg/dL (ref 70–99)
Glucose-Capillary: 90 mg/dL (ref 70–99)

## 2019-02-06 SURGERY — VULVECTOMY
Anesthesia: General | Laterality: Left

## 2019-02-06 MED ORDER — BUPIVACAINE HCL (PF) 0.25 % IJ SOLN
INTRAMUSCULAR | Status: AC
  Filled 2019-02-06: qty 30

## 2019-02-06 MED ORDER — OXYCODONE HCL 5 MG PO TABS: 5.0000 mg | ORAL_TABLET | ORAL | 0 refills | Status: DC | PRN

## 2019-02-06 MED ORDER — DEXAMETHASONE SODIUM PHOSPHATE 10 MG/ML IJ SOLN
INTRAMUSCULAR | Status: DC | PRN
  Administered 2019-02-06: 8 mg via INTRAVENOUS

## 2019-02-06 MED ORDER — 0.9 % SODIUM CHLORIDE (POUR BTL) OPTIME
TOPICAL | Status: DC | PRN
  Administered 2019-02-06: 1000 mL

## 2019-02-06 MED ORDER — LIDOCAINE 2% (20 MG/ML) 5 ML SYRINGE
INTRAMUSCULAR | Status: AC
  Filled 2019-02-06: qty 5

## 2019-02-06 MED ORDER — BUPIVACAINE LIPOSOME 1.3 % IJ SUSP
20.0000 mL | Freq: Once | INTRAMUSCULAR | Status: AC
  Administered 2019-02-06: 20 mL
  Filled 2019-02-06: qty 20

## 2019-02-06 MED ORDER — FENTANYL CITRATE (PF) 100 MCG/2ML IJ SOLN: 25.0000 ug | INTRAMUSCULAR | Status: DC | PRN

## 2019-02-06 MED ORDER — HYDROMORPHONE HCL 1 MG/ML IJ SOLN: 0.2000 mg | INTRAMUSCULAR | Status: DC | PRN

## 2019-02-06 MED ORDER — LACTATED RINGERS IV SOLN
INTRAVENOUS | Status: DC
  Administered 2019-02-06: 09:00:00 via INTRAVENOUS

## 2019-02-06 MED ORDER — LIDOCAINE 2% (20 MG/ML) 5 ML SYRINGE
INTRAMUSCULAR | Status: DC | PRN
  Administered 2019-02-06: 60 mg via INTRAVENOUS

## 2019-02-06 MED ORDER — PROPOFOL 10 MG/ML IV BOLUS
INTRAVENOUS | Status: DC | PRN
  Administered 2019-02-06: 50 mg via INTRAVENOUS
  Administered 2019-02-06: 150 mg via INTRAVENOUS

## 2019-02-06 MED ORDER — OXYCODONE HCL 5 MG/5ML PO SOLN: 5.0000 mg | Freq: Once | ORAL | Status: DC | PRN

## 2019-02-06 MED ORDER — SENNA 8.6 MG PO TABS: 1.0000 | ORAL_TABLET | Freq: Every day | ORAL | 1 refills | Status: DC

## 2019-02-06 MED ORDER — ACETAMINOPHEN 500 MG PO TABS: 1000.0000 mg | ORAL_TABLET | Freq: Four times a day (QID) | ORAL | Status: DC

## 2019-02-06 MED ORDER — OXYCODONE HCL 5 MG PO TABS: 5.0000 mg | ORAL_TABLET | Freq: Once | ORAL | Status: DC | PRN

## 2019-02-06 MED ORDER — LIDOCAINE HCL (PF) 1 % IJ SOLN
INTRAMUSCULAR | Status: AC
  Filled 2019-02-06: qty 30

## 2019-02-06 MED ORDER — FENTANYL CITRATE (PF) 100 MCG/2ML IJ SOLN
INTRAMUSCULAR | Status: AC
  Filled 2019-02-06: qty 2

## 2019-02-06 MED ORDER — BUPIVACAINE HCL (PF) 0.25 % IJ SOLN
INTRAMUSCULAR | Status: DC | PRN
  Administered 2019-02-06: 10 mL

## 2019-02-06 MED ORDER — CEFAZOLIN SODIUM-DEXTROSE 2-4 GM/100ML-% IV SOLN
2.0000 g | INTRAVENOUS | Status: AC
  Administered 2019-02-06: 2 g via INTRAVENOUS
  Filled 2019-02-06: qty 100

## 2019-02-06 MED ORDER — ACETIC ACID 5 % SOLN
1.0000 "application " | Freq: Once | Status: DC
  Filled 2019-02-06: qty 500

## 2019-02-06 MED ORDER — ONDANSETRON HCL 4 MG/2ML IJ SOLN: 4.0000 mg | Freq: Once | INTRAMUSCULAR | Status: DC | PRN

## 2019-02-06 MED ORDER — FENTANYL CITRATE (PF) 100 MCG/2ML IJ SOLN
INTRAMUSCULAR | Status: DC | PRN
  Administered 2019-02-06 (×2): 25 ug via INTRAVENOUS

## 2019-02-06 MED ORDER — PROPOFOL 10 MG/ML IV BOLUS
INTRAVENOUS | Status: AC
  Filled 2019-02-06: qty 20

## 2019-02-06 MED ORDER — DEXAMETHASONE SODIUM PHOSPHATE 10 MG/ML IJ SOLN
INTRAMUSCULAR | Status: AC
  Filled 2019-02-06: qty 1

## 2019-02-06 MED ORDER — ONDANSETRON HCL 4 MG/2ML IJ SOLN
INTRAMUSCULAR | Status: AC
  Filled 2019-02-06: qty 2

## 2019-02-06 MED ORDER — CELECOXIB 100 MG PO CAPS: 100.0000 mg | ORAL_CAPSULE | Freq: Two times a day (BID) | ORAL | Status: DC

## 2019-02-06 MED ORDER — OXYCODONE HCL 5 MG PO TABS: 5.0000 mg | ORAL_TABLET | ORAL | Status: DC | PRN

## 2019-02-06 MED ORDER — ONDANSETRON HCL 4 MG/2ML IJ SOLN
INTRAMUSCULAR | Status: DC | PRN
  Administered 2019-02-06: 4 mg via INTRAVENOUS

## 2019-02-06 SURGICAL SUPPLY — 33 items
BLADE SURG 15 STRL LF DISP TIS (BLADE) ×1 IMPLANT
BLADE SURG 15 STRL SS (BLADE) ×2
BNDG GAUZE ELAST 4 BULKY (GAUZE/BANDAGES/DRESSINGS) IMPLANT
BRIEF STRETCH FOR OB PAD LRG (UNDERPADS AND DIAPERS) ×2 IMPLANT
CLEANER TIP ELECTROSURG 2X2 (MISCELLANEOUS) ×1 IMPLANT
COVER SURGICAL LIGHT HANDLE (MISCELLANEOUS) ×2 IMPLANT
COVER WAND RF STERILE (DRAPES) IMPLANT
DRAPE SHEET LG 3/4 BI-LAMINATE (DRAPES) ×2 IMPLANT
DRAPE UNDERBUTTOCKS STRL (DISPOSABLE) ×1 IMPLANT
DRSG PAD ABDOMINAL 8X10 ST (GAUZE/BANDAGES/DRESSINGS) IMPLANT
ELECT PENCIL ROCKER SW 15FT (MISCELLANEOUS) ×2 IMPLANT
GAUZE 4X4 16PLY RFD (DISPOSABLE) ×1 IMPLANT
GLOVE BIO SURGEON STRL SZ 6 (GLOVE) ×4 IMPLANT
GLOVE BIO SURGEON STRL SZ 6.5 (GLOVE) ×2 IMPLANT
GOWN STRL REUS W/ TWL LRG LVL3 (GOWN DISPOSABLE) ×2 IMPLANT
GOWN STRL REUS W/TWL LRG LVL3 (GOWN DISPOSABLE) ×4
KIT BASIN OR (CUSTOM PROCEDURE TRAY) ×2 IMPLANT
KIT TURNOVER KIT A (KITS) IMPLANT
NDL HYPO 25X1 1.5 SAFETY (NEEDLE) ×1 IMPLANT
NEEDLE HYPO 25X1 1.5 SAFETY (NEEDLE) ×2 IMPLANT
PACK LITHOTOMY IV (CUSTOM PROCEDURE TRAY) ×2 IMPLANT
PACK PERINEAL COLD (PAD) ×2 IMPLANT
PAD TELFA 2X3 NADH STRL (GAUZE/BANDAGES/DRESSINGS) ×1 IMPLANT
SPONGE LAP 18X18 RF (DISPOSABLE) ×1 IMPLANT
SUT VIC AB 2-0 SH 27 (SUTURE) ×6
SUT VIC AB 2-0 SH 27X BRD (SUTURE) ×4 IMPLANT
SUT VIC AB 3-0 SH 27 (SUTURE) ×6
SUT VIC AB 3-0 SH 27X BRD (SUTURE) ×10 IMPLANT
SUT VIC AB 4-0 PS2 27 (SUTURE) ×4 IMPLANT
SUT VIC AB 4-0 SH 18 (SUTURE) ×11 IMPLANT
SYR CONTROL 10ML LL (SYRINGE) ×2 IMPLANT
TRAY FOLEY MTR SLVR 16FR STAT (SET/KITS/TRAYS/PACK) ×2 IMPLANT
YANKAUER SUCT BULB TIP 10FT TU (MISCELLANEOUS) ×2 IMPLANT

## 2019-02-06 NOTE — Anesthesia Procedure Notes (Signed)
Procedure Name: LMA Insertion Date/Time: 02/06/2019 10:21 AM Performed by: Victoriano Lain, CRNA Pre-anesthesia Checklist: Patient identified, Emergency Drugs available, Suction available, Patient being monitored and Timeout performed Patient Re-evaluated:Patient Re-evaluated prior to induction Oxygen Delivery Method: Circle system utilized Preoxygenation: Pre-oxygenation with 100% oxygen Induction Type: IV induction Ventilation: Mask ventilation without difficulty LMA: LMA with gastric port inserted LMA Size: 4.0 Number of attempts: 1 Placement Confirmation: positive ETCO2 and breath sounds checked- equal and bilateral Tube secured with: Tape Dental Injury: Teeth and Oropharynx as per pre-operative assessment

## 2019-02-06 NOTE — Discharge Instructions (Signed)
Vulvectomy, Care After The vulva is the external female genitalia, outside and around the vagina and pubic bone. It consists of:  The skin on, and in front of, the pubic bone.  The clitoris.  The labia majora (large lips) on the outside of the vagina.  The labia minora (small lips) around the opening of the vagina.  The opening and the skin in and around the vagina. A vulvectomy is the removal of the tissue of the vulva, which sometimes includes removal of the lymph nodes and tissue in the groin areas. These discharge instructions provide you with general information on caring for yourself after you leave the hospital. It is also important that you know the warning signs of complications, so that you can seek treatment. Please read the instructions outlined below and refer to this sheet in the next few weeks. Your caregiver may also give you specific information and medicines. If you have any questions or complications after discharge, please call your caregiver.  Medications:  - Take ibuprofen and tylenol first line for pain control. Take these regularly (every 6 hours) to decrease the build up of pain.  - If necessary, for severe pain not relieved by ibuprofen, take percocet.  - While taking percocet you should take sennakot every night to reduce the likelihood of constipation. If this causes diarrhea, stop its use.  - do not restart your Pradaxa until 3 days postop (02/09/19).   ACTIVITY  Rest as much as possible the first two weeks after discharge.  Arrange to have help from family or others with your daily activities when you go home.  Avoid heavy lifting (more than 5 pounds), pushing, or pulling.  If you feel tired, balance your activity with rest periods.  Follow your caregiver's instruction about climbing stairs and driving a car.  Increase activity gradually.  Do not exercise until you have permission from your caregiver. LEG AND FOOT CARE If your doctor has removed  lymph nodes from your groin area, there may be an increase in swelling of your legs and feet. You can help prevent swelling by doing the following:  Elevate your legs while sitting or lying down.  If your caregiver has ordered special stockings, wear them according to instructions.  Avoid standing in one place for long periods of time.  Call the physical therapy department if you have any questions about swelling or treatment for swelling.  Avoid salt in your diet. It can cause fluid retention and swelling.  Do not cross your legs, especially when sitting. NUTRITION  You may resume your normal diet.  Drink 6 to 8 glasses of fluids a day.  Eat a healthy, balanced diet including portions of food from the meat (protein), milk, fruit, vegetable, and bread groups.  Your caregiver may recommend you take a multivitamin with iron. ELIMINATION  You may notice that your stream of urine is at a different angle, and may tend to spray. Using a plastic funnel may help to decrease urine spray.  If constipation occurs, drink more liquids, and add more fruits, vegetables, and bran to your diet. You may take a mild laxative, such as Milk of Magnesia, Metamucil, or a stool softener such as Colace, with permission from your caregiver. HYGIENE  You may shower and wash your hair.  Check with your caregiver about tub baths.  Do not add any bath oils or chemicals to your bath water, after you have permission to take baths.  While passing urine, pour water from a bottle  or spray over your vulva to dilute the urine as it passes the incision (this will decrease burning and discomfort).  Clean yourself well after moving your bowels.  After urinating, do not wipe. Dap or pat dry with toilet paper or a dry cleath soft cloth.  A sitz bath will help keep your perineal area clean, reduce swelling, and provide comfort.  Avoid wearing underpants for the first 2 weeks and wear loose skirts to allow  circulation of air around the incision  You do not need to apply dressings, salves or lotions to the wound.  The stitches are self-dissolving and will absorb and disappear over a couple of months (it is normal to notice the knot from the stitches on toilet paper after voiding). HOME CARE INSTRUCTIONS   Apply a soft ice pack (or frozen bag of peas) to your perineum (vulva) every hour in the first 48 hours after surgery. This will reduce swelling.  Avoid activities that involve a lot of friction between your legs.  Avoid wearing pants or underpants in the 1st 2 weeks (skirts are preferable).  Take your temperature twice a day and record it, especially if you feel feverish or have chills.  Follow your caregiver's instructions about medicines, activity, and follow-up appointments after surgery.  Do not drink alcohol while taking pain medicine.  Change your dressing as advised by your caregiver.  You may take over-the-counter medicine for pain, recommended by your caregiver.  If your pain is not relieved with medicine, call your caregiver.  Do not take aspirin because it can cause bleeding.  Do not douche or use tampons (use a nonperfumed sanitary pad).  Do not have sexual intercourse until your caregiver gives you permission (typically 6 weeks postoperatively). Hugging, kissing, and playful sexual activity is fine with your caregiver's permission.  Warm sitz baths, with your caregiver's permission, are helpful to control swelling and discomfort.  Take showers instead of baths, until your caregiver gives you permission to take baths.  You may take a mild medicine for constipation, recommended by your caregiver. Bran foods and drinking a lot of fluids will help with constipation.  Make sure your family understands everything about your operation and recovery. SEEK MEDICAL CARE IF:   You notice swelling and redness around the wound area.  You notice a foul smell coming from the  wound or on the surgical dressing.  You notice the wound is separating.  You have painful or bloody urination.  You develop nausea and vomiting.  You develop diarrhea.  You develop a rash.  You have a reaction or allergy from the medicine.  You feel dizzy or light-headed.  You need stronger pain medicine. SEEK IMMEDIATE MEDICAL CARE IF:   You develop a temperature of 102 F (38.9 C) or higher.  You pass out.  You develop leg or chest pain.  You develop abdominal pain.  You develop shortness of breath.  You develop bleeding from the wound area.  You see pus in the wound area. MAKE SURE YOU:   Understand these instructions.  Will watch your condition.  Will get help right away if you are not doing well or get worse. Document Released: 12/28/2003 Document Revised: 09/29/2013 Document Reviewed: 04/16/2009 [facility] Patient Information 2015 [place], [place]. This information is not intended to replace advice given to you by your health care provider. Make sure you discuss any questions you have with your health care provider.

## 2019-02-06 NOTE — Anesthesia Postprocedure Evaluation (Signed)
Anesthesia Post Note  Patient: [place] Drye Venuti  Procedure(s) Performed: POSTERIOR RADICAL VULVECTOMY (Left )     Patient location during evaluation: PACU Anesthesia Type: General Level of consciousness: awake and alert Pain management: pain level controlled Vital Signs Assessment: post-procedure vital signs reviewed and stable Respiratory status: spontaneous breathing, nonlabored ventilation and respiratory function stable Cardiovascular status: blood pressure returned to baseline and stable Postop Assessment: no apparent nausea or vomiting Anesthetic complications: no    Last Vitals:  Vitals:   02/06/19 1145 02/06/19 1200  BP: (!) 141/83 (!) 147/55  Pulse: (!) 54 60  Resp: 19 19  Temp: (!) 36.4 C (!) 36.4 C  SpO2: 98% 100%    Last Pain:  Vitals:   02/06/19 1200  TempSrc:   PainSc: 0-No pain                 Audry Pili

## 2019-02-06 NOTE — Op Note (Signed)
OPERATIVE NOTE  PATIENT: Diane [name] ENCOUNTER DATE: 02/06/19   Preop Diagnosis:   Postoperative Diagnosis:   Surgery: Partial radical posterior vulvectomy  Surgeons:  Donaciano Eva, MD Assistant: none  Anesthesia: General   Estimated blood loss: <75ml  IVF:  400 ml   Urine output: scant   Complications: None   Pathology: left posterior vaginal introitus with marking stitch at midline perineal body (external perineal/vulvar margin, not vaginal margin)    Operative findings: There was a verrucous 2cm friable lesion on the left posterior introitus encroaching on the vaginal mucosa.   Procedure: The patient was identified in the preoperative holding area. Informed consent was signed on the chart. Patient was seen history was reviewed and exam was performed.   The patient was then taken to the operating room and placed in the supine position with SCD hose on. General anesthesia was then induced without difficulty. She was then placed in the dorsolithotomy position. The perineum was prepped with Betadine. The vagina was prepped with chlorhexadine. The patient was then draped after the prep was dried. A Foley catheter was inserted into the bladder under sterile conditions.  Timeout was performed the patient, procedure, antibiotic, allergy, and length of procedure. The lesion was identified and the marking pen was used to circumscribe the area with appropriate surgical margins. The subcuticular tissues were infiltrated with 0.25% marcaine and exparel. The 15 blade scalpel was used to make an incision through the skin circumferentially as marked. The skin elipse was grasped and was separated from the underlying endopelvic fascia, external anal sphincter and anterior rectal wall with the bovie device. After the specimen had been completely resected, it was oriented and marked at the midline perineal body which represented the midline distal vulvar/perineal margin. The bovie was  used to obtain hemostasis at the surgical bed. The subcutaneous tissues were irrigated and made hemostatic.   The external sphincter was reapproximated with 2-0 vicryl sutures. The deep dermal layer was approximated with 3-0vicryl mattress sutures to bring the skin edges into approximation and off tension. The wound was closed following langher's lines. The cutaneous layer was closed with interrupted 4-0 vicryl stitches and mattress sutures to ensure a tension free and hemostatic closure. The perineum was again irrigated. The foley was removed.  All instrument, suture, laparotomy, Ray-Tec, and needle counts were correct x2. The patient tolerated the procedure well and was taken recovery room in stable condition. This is Everitt Amber dictating an operative note on Diane Franzen.  Thereasa Solo, MD

## 2019-02-06 NOTE — H&P (Signed)
Consult Note: Gyn-Onc  Consult was requested by Dr. Landry Mellow for the evaluation of Diane [name] 78 y.o. female  CC:  History of Vulvar cancer, VIN 3.   Assessment/Plan:  [name]  is a 78 y.o.  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  The lesion is exophytic and somewhat fleshy which raises my suspicion for an occult invasive process that was not apparent on pathologic evaluation of the biopsy.  I am recommending a surgical excision with modified radical left vulvectomy to excise the area in its entirety.  We will attempt to get as deeper margin as possible, however this abuts the rectal and anal sphincter and therefore extensive deep margins will not be likely possible.  If malignancy is identified on final pathology, if margins are close, she would require adjuvant radiation to consolidate margin status.  I explained that we will not re-resect her inguinal nodes given that she is already had a complete inguinal lymphadenectomy.  Given that she has some symptoms in the groin, I will order a groin ultrasound scan to evaluate for an occult lymph node metastasis.  She is due to see her cardiologist Dr. Helene Shoe later this month.  We will discuss with him whether she is optimized for a general anesthetic and surgical intervention, and clarify goals of suspending anticoagulant therapy for the procedure.  It would be my preference to have her come off Coumadin 1 week preoperatively and if necessary bridge with Lovenox up until the day of surgery, placing her back on Coumadin immediately postoperatively until her INR levels have reached acceptable goals.  I explained with the patient that being on anticoagulant therapy perioperatively increases her risk for major bleeding complication in addition to wound healing issues.  She has type 2 diabetes mellitus with sequelae.  Hemoglobin A1c was 6.3.  I explained the surgical procedure and goals to the  patient.  I explained anticipated postoperative recovery and recommendations.  She lives alone but has managed independently after prior surgeries including her recent open heart surgery.  Her daughter and granddaughter live locally.   HPI: Diane [name] is a 78 year old woman who was seen in consultation at the request of Dr. 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  In a remote history the patient was seeing my partner, Dr. Nancy Marus up until 2016.  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  This was treated with a radical left posterior vulvectomy with bilateral inguinal lymphadenectomy.  Final pathology showed no metastatic disease.  She required no adjuvant therapy at that time.  She remained free of disease for 5 years and was discharged from care.  In December 2019 she underwent open heart surgery for a mitral and tricuspid valve replacement.  Following the surgery she was placed on Coumadin therapy and began experiencing intermittent vaginal spotting.  She was seen by Dr. Landry Mellow on December 25, 2018.  Dr. Landry Mellow performed a gynecologic evaluation and upon inspection of the external genitalia identified a fleshy pink exophytic 1.5 cm growth in the left introitus posteriorly.  This was biopsied on December 30, 2018 revealed high-grade vulvar intraepithelial neoplasia, VIN 3.  The biopsy margins were involved.  She reported a history of left inguinal discomfort and pain with elevation of the leg that she had noted for approximately 6 months prior to this diagnosis of recurrence.  It had been worked up and evaluated by her orthopedic surgeon but not felt to be a primary hip issue or surgical issue.  The patient's medical history is significant for obesity, valvular heart disease (status post open heart surgery with mitral and tricuspid valve replacement in December 2019 with Dr. Roxy Manns), the patient's  cardiologist is Dr. Terrence Dupont.  She also has atrial fibrillation since 2016.  She takes Coumadin and aspirin 81 mg.  She has a history of severe restrictive lung disease.  Patient has a history of right-sided breast cancer treated with Dr. Markus Jarvis and [place].  For this she required surgery, radiation, and chemotherapy.  She has aortic atherosclerosis.  She has hypertension and diabetes mellitus treated with oral agents.  Her surgical history significant for a left radical vulvectomy and bilateral inguinal lymphadenectomy in 2011.  She has had a remote history of hysterectomy for abnormal uterine bleeding but no malignancy.  Ovaries removed at the same time.  Patient has had orthopedic surgery on the thumb and rotator cuff.  She had mitral and tricuspid cuspid valve repair in 2019.  She has had cataract surgery.  Patient is an ex-smoker but quit in 2011.  She had a 50-pack-year history prior to that.  She lives alone.  Current Meds:  Outpatient Encounter Medications as of 01/06/2019  Medication Sig  . aspirin EC 81 MG tablet Take 81 mg by mouth daily.  . busPIRone (BUSPAR) 5 MG tablet Take 5 mg by mouth as needed.   . furosemide (LASIX) 40 MG tablet Take 1 tablet (40 mg total) by mouth daily.  . Lancets (ONETOUCH DELICA PLUS [id number]) MISC USE AS DIRECTED DAILY FOR 30 DAYS  . metFORMIN (GLUCOPHAGE-XR) 500 MG 24 hr tablet Take 1 tablet (500 mg total) by mouth daily with breakfast. Total dose of 1000mg  daily (Patient taking differently: Take 500 mg by mouth daily. )  . nitroGLYCERIN (NITROSTAT) 0.4 MG SL tablet DISSOLVE ONE TABLET UNDER THE TONGUE EVERY 5 MINUTES AS NEEDED FOR CHEST PAIN. DO NOT EXCEED A TOTAL OF 3 DOSES IN 15 MINUTES  . ONETOUCH VERIO test strip FOR USE WHEN CHECKING BLOOD GLUCOSE ONCE DAILY ALTERNATING AM AND PM BEFORE MEALS FINGER STICK 90 DAYS  . Potassium Chloride ER 20 MEQ TBCR TAKE 1 TABLET BY MOUTH ONCE DAILY WITH FOOD  . rosuvastatin (CRESTOR) 5 MG tablet Take 5 mg by  mouth daily.  [name] Kitchen spironolactone (ALDACTONE) 25 MG tablet Take 1 tablet (25 mg total) by mouth daily.  [name] Kitchen warfarin (COUMADIN) 3 MG tablet Take 3 mg by mouth daily at 6 PM. On Sunday 1.5 tablets  . gabapentin (NEURONTIN) 300 MG capsule TAKE 1 CAPSULE BY MOUTH EVERY NIGHT   No facility-administered encounter medications on file as of 01/06/2019.     Allergy:  Allergies  Allergen Reactions  . Shellfish Allergy Anaphylaxis    Social Hx:   Social History   Socioeconomic History  . Marital status: Widowed    Spouse name: Not on file  . Number of children: Not on file  . Years of education: Not on file  . Highest education level: Not on file  Occupational History  . Occupation: retired  Scientific laboratory technician  . Financial resource strain: Not on file  . Food insecurity    Worry: Not on file    Inability: Not on file  . Transportation needs    Medical: Not on file    Non-medical: Not on file  Tobacco Use  . Smoking status: Former Smoker  Years: 50.00    Types: Cigarettes    Quit date: 05/29/2009    Years since quitting: 9.6  . Smokeless tobacco: Never Used  Substance and Sexual Activity  . Alcohol use: Yes    Comment: rare  . Drug use: No  . Sexual activity: Not Currently  Lifestyle  . Physical activity    Days per week: Not on file    Minutes per session: Not on file  . Stress: Not on file  Relationships  . Social Herbalist on phone: Not on file    Gets together: Not on file    Attends religious service: Not on file    Active member of club or organization: Not on file    Attends meetings of clubs or organizations: Not on file    Relationship status: Not on file  . Intimate partner violence    Fear of current or ex partner: Not on file    Emotionally abused: Not on file    Physically abused: Not on file    Forced sexual activity: Not on file  Other Topics Concern  . Not on file  Social History Narrative   Tobacco use cigarettes: former smoker, quit in year Sept  2011, Pack-year Hx: 43, tobacco history last updated 10/10/2013. No smoking. Per patient stopped smoking 9 months ago. No alcohol. Caffeine.: yes, 2+ servings daily, tea. No recreational drug use. Exercise: [employer] water [employer]. Marital status: widow.    Past Surgical Hx:  Past Surgical History:  Procedure Laterality Date  . ABDOMINAL HYSTERECTOMY    . BREAST BIOPSY Right 09/04/2012  . BREAST LUMPECTOMY Right 10/08/2012  . CLIPPING OF ATRIAL APPENDAGE N/A 05/09/2018   Procedure: CLIPPING OF ATRIAL APPENDAGE USING ATRICLIP PRO2 CLIP SIZE 45MM;  Surgeon: Rexene Alberts, MD;  Location: [place];  Service: Open Heart Surgery;  Laterality: N/A;  . COLONOSCOPY    . DILATION AND CURETTAGE OF UTERUS    . LEFT HEART CATHETERIZATION WITH CORONARY ANGIOGRAM N/A 09/14/2014   Procedure: LEFT HEART CATHETERIZATION WITH CORONARY ANGIOGRAM;  Surgeon: [name], MD;  Location: [facility] CATH LAB;  Service: Cardiovascular;  Laterality: N/A;  . MAZE N/A 05/09/2018   Procedure: MAZE;  Surgeon: Rexene Alberts, MD;  Location: [place];  Service: Open Heart Surgery;  Laterality: N/A;  . MITRAL VALVE REPAIR N/A 05/09/2018   Procedure: MITRAL VALVE REPAIR (MVR) USING MEMO 4D RING SIZE 26MM;  Surgeon: Rexene Alberts, MD;  Location: [place];  Service: Open Heart Surgery;  Laterality: N/A;  . OPEN REDUCTION INTERNAL FIXATION (ORIF) PROXIMAL PHALANX Right 01/04/2016   Procedure: OPEN TREATMENT OF RIGHT THUMB PROXIMAL PHALANX FRACTURE;  Surgeon: Milly Jakob, MD;  Location: [place];  Service: Orthopedics;  Laterality: Right;  . PARTIAL MASTECTOMY WITH NEEDLE LOCALIZATION AND AXILLARY SENTINEL LYMPH NODE BX Right 10/08/2012   Procedure: RIGHT PARTIAL MASTECTOMY WITH NEEDLE LOCALIZATION AND RIGHT AXILLARY SENTINEL LYMPH NODE BX;  Surgeon: Adin Hector, MD;  Location: [place];  Service: General;  Laterality: Right;  Injection of 1% Methylene Blue dye into right breast  . PORTACATH PLACEMENT Left 10/08/2012    Procedure: INSERTION PORT-A-CATH WITH ULTRASOUND GUIDANCE;  Surgeon: Adin Hector, MD;  Location: [place];  Service: General;  Laterality: Left;  Using 40F [place]  . Posterior Lumbar Arthrodesis, Pedicle screw fixation, Posterolateral Arthodesis  03/17/11  . RIGHT/LEFT HEART CATH AND CORONARY ANGIOGRAPHY N/A 05/02/2018   Procedure: RIGHT/LEFT HEART CATH AND CORONARY ANGIOGRAPHY;  Surgeon:  [name], MD;  Location: [place] CV LAB;  Service: Cardiovascular;  Laterality: N/A;  . SHOULDER ARTHROSCOPY WITH ROTATOR CUFF REPAIR AND SUBACROMIAL DECOMPRESSION  06/04/2012   Procedure: SHOULDER ARTHROSCOPY WITH ROTATOR CUFF REPAIR AND SUBACROMIAL DECOMPRESSION;  Surgeon: Nita Sells, MD;  Location: [place];  Service: Orthopedics;  Laterality: Left;  LEFT SHOULDER ARTHROSCOPY WITH ROTATOR CUFF REPAIR AND SUBACROMIAL DECOMPRESSION AND DISTAL CLAVICLE RESECTION  . TEE WITHOUT CARDIOVERSION N/A 05/03/2018   Procedure: TRANSESOPHAGEAL ECHOCARDIOGRAM (TEE);  Surgeon: Dixie Dials, MD;  Location: [facility] ENDOSCOPY;  Service: Cardiovascular;  Laterality: N/A;  . TEE WITHOUT CARDIOVERSION N/A 05/09/2018   Procedure: TRANSESOPHAGEAL ECHOCARDIOGRAM (TEE);  Surgeon: Rexene Alberts, MD;  Location: [place];  Service: Open Heart Surgery;  Laterality: N/A;  . TONSILLECTOMY    . TRICUSPID VALVE REPLACEMENT N/A 05/09/2018   Procedure: TRICUSPID VALVE REPAIR USING MC3 RING SIZE 28MM;  Surgeon: Rexene Alberts, MD;  Location: [place];  Service: Open Heart Surgery;  Laterality: N/A;  . TUBAL LIGATION    . VULVECTOMY PARTIAL    . Wide Local Excision  01/2010   Bilateral groin excisions, re-excision of vulva    Past Medical Hx:  Past Medical History:  Diagnosis Date  . Anxiety   . Arthritis    back  . Atrial fibrillation ([place])   . Breast cancer ([place])    right breast cancer - 3 years ago  . Chronic diastolic heart failure (HCC)    ECHO: 06/20/2017 - Grade 2 Diastolic Dysfunction    . Coronary artery disease   . Depression   . Diabetes mellitus without complication (HCC)    diet  . Dysrhythmia    afib  . Fibromyalgia   . Herniated disc   . Hyperlipemia   . Hypertension   . Iron deficiency anemia   . Longstanding persistent atrial fibrillation   . Lower back pain   . Lumbar stenosis    L4-5  . Personal history of chemotherapy   . Personal history of radiation therapy   . Pulmonary hypertension ([place])   . S/P Maze operation for atrial fibrillation 05/09/2018   Complete bilateral atrial lesion set using bipolar radiofrequency and cryothermy ablation with clipping of LA appendage  . 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 S/P radiation therapy 01/23/2013-03/06/2013   Right breast / 46 Gy in 23 fractions / Right breast boost / 14 Gy in 7 fractions  . 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 Severe mitral valve regurgitation   . Spondylolysis   . 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 Status post chemotherapy 11/04/2012 - 01/06/2013.   Docetaxel/Cytoxan Q 3 Weeks x 4 cycles  . Torn rotator cuff   . Tricuspid regurgitation     Past Gynecological History:  See HPI. Hx of vulvectomy for vulvar cancer 2011. No LMP recorded. Patient has had a hysterectomy.  Family Hx:  Family History  Problem Relation Age of Onset  . Stroke Mother   . Alzheimer's disease Mother   . Heart attack Father   . Heart attack Brother   . Breast cancer Neg Hx     Review of Systems:  Constitutional  Feels well,   ENT Normal appearing ears and nares bilaterally Skin/Breast  No rash, sores, jaundice, itching, dryness Cardiovascular  No chest pain, shortness of breath, or edema  Pulmonary  No cough or wheeze.  Gastro Intestinal  No nausea, vomitting, or diarrhoea. No bright red blood per rectum, no abdominal pain, change in bowel movement, or constipation.  Genito Urinary   No frequency, urgency, dysuria,  Musculo Skeletal  + left inguinal pain with elevation of the leg Neurologic  No weakness, numbness, change in gait,  Psychology  No depression, anxiety, insomnia.   Vitals:  Blood pressure 140/77, pulse 69, temperature 98.1 F (36.7 C), temperature source Oral, resp. rate 18, height 5\' 7"  (1.702 m), weight 203 lb (92.1 kg), SpO2 100 %.  Physical Exam: WD in NAD Neck  Supple NROM, without any enlargements.  Lymph Node Survey No cervical supraclavicular or inguinal adenopathy Cardiovascular  Pulse normal rate, regularity and rhythm. S1 and S2 normal.  Lungs  Clear to auscultation bilateraly, without wheezes/crackles/rhonchi. Good air movement.  Skin  No rash/lesions/breakdown  Psychiatry  Alert and oriented to person, place, and time  Abdomen  Normoactive bowel sounds, abdomen soft, non-tender and obese without evidence of hernia.  Back No CVA tenderness Genito Urinary  Vulva/vagina: Normal external female genitalia.  There is a fleshy, exophytic 2 cm lesion at the posterior left introitus encroaching upon extending into the most distal portion of the vagina.  It is closely abutting the anal sphincter.  It is mobile and not fixed.  Application of acetic acid reveals no extension of VIN 3 onto the surrounding skin or into the vagina.  Bladder/urethra:  No lesions or masses, well supported bladder  Vagina: grossly normal and free of lesions  Cervix and uterus surgically absent  Adnexa: no palpable masses. Rectal  Good tone, no masses no cul de sac nodularity.  Extremities  No bilateral cyanosis, clubbing or edema.   Thereasa Solo, MD  02/06/2019, 9:54 AM

## 2019-02-06 NOTE — Transfer of Care (Signed)
Immediate Anesthesia Transfer of Care Note  Patient: Diane [name]  Procedure(s) Performed: POSTERIOR RADICAL VULVECTOMY (Left )  Patient Location: PACU  Anesthesia Type:General  Level of Consciousness: awake, alert , oriented and patient cooperative  Airway & Oxygen Therapy: Patient Spontanous Breathing and Patient connected to face mask oxygen  Post-op Assessment: Report given to RN, Post -op Vital signs reviewed and stable and Patient moving all extremities  Post vital signs: Reviewed and stable  Last Vitals:  Vitals Value Taken Time  BP 155/67 02/06/19 1128  Temp    Pulse 56 02/06/19 1130  Resp 14 02/06/19 1130  SpO2 100 % 02/06/19 1130  Vitals shown include unvalidated device data.  Last Pain:  Vitals:   02/06/19 0819  TempSrc:   PainSc: 4          Complications: No apparent anesthesia complications

## 2019-02-07 ENCOUNTER — Telehealth: Payer: Self-pay

## 2019-02-07 ENCOUNTER — Encounter (HOSPITAL_COMMUNITY): Payer: Self-pay | Admitting: Gynecologic Oncology

## 2019-02-07 NOTE — Telephone Encounter (Signed)
Diane [name] states that she is doing great today. She is eating, drinking, and urinating well. Passing gas. Using the ice packs on her vulva as directed. Diane [name] has the office number [phone] if she has any questions or concerns.

## 2019-02-10 ENCOUNTER — Telehealth: Payer: Self-pay | Admitting: *Deleted

## 2019-02-10 NOTE — Telephone Encounter (Signed)
Called and moved appt from Friday to Monday

## 2019-02-12 ENCOUNTER — Ambulatory Visit (INDEPENDENT_AMBULATORY_CARE_PROVIDER_SITE_OTHER): Payer: Medicare Other | Admitting: Podiatry

## 2019-02-12 ENCOUNTER — Encounter: Payer: Self-pay | Admitting: Podiatry

## 2019-02-12 ENCOUNTER — Other Ambulatory Visit: Payer: Self-pay

## 2019-02-12 DIAGNOSIS — B351 Tinea unguium: Secondary | ICD-10-CM

## 2019-02-12 DIAGNOSIS — M6788 Other specified disorders of synovium and tendon, other site: Secondary | ICD-10-CM | POA: Diagnosis not present

## 2019-02-12 DIAGNOSIS — M79676 Pain in unspecified toe(s): Secondary | ICD-10-CM

## 2019-02-12 DIAGNOSIS — E1140 Type 2 diabetes mellitus with diabetic neuropathy, unspecified: Secondary | ICD-10-CM

## 2019-02-12 NOTE — Progress Notes (Signed)
Patient ID: Diane [name], female   DOB: January 06, 1941, 78 y.o.   MRN: [id number]   Complaint:  Visit Type: Patient returns to my office for continued preventative foot care services. Complaint: Patient states" my nails have grown long and thick and become painful to walk and wear shoes" Patient has been diagnosed with DM with neuropathy.. The patient presents for preventative foot care services. No changes to ROS.  Patient also says she has marked spasming in her right lower leg when she sleeps.  She says she needs to stand and stretch her foot when this occurs.  She questions if there is a brace for her to wear.    Podiatric Exam: Vascular: dorsalis pedis and posterior tibial pulses are palpable bilateral. Capillary return is immediate. Temperature gradient is WNL. Skin turgor WNL  Sensorium: Diminished  Semmes Weinstein monofilament test. Normal tactile sensation bilaterally. Nail Exam: Pt has thick disfigured discolored nails with subungual debris noted bilateral entire nail hallux through fifth toenails Ulcer Exam: There is no evidence of ulcer or pre-ulcerative changes or infection. Orthopedic Exam: Muscle tone and strength are WNL. No limitations in general ROM. No crepitus or effusions noted. Foot type and digits show no abnormalities.HAV 1st MPJ B/L.  Peroneal right foot is weak 1/4 muscle strength.   Skin: No Porokeratosis. No infection or ulcers  Diagnosis:  Onychomycosis, , Pain in right toe, pain in left toes  Peroneal tendinopathy right ankle.    Treatment & Plan Procedures and Treatment: Consent by patient was obtained for treatment procedures. The patient understood the discussion of treatment and procedures well. All questions were answered thoroughly reviewed. Debridement of mycotic and hypertrophic toenails, 1 through 5 bilateral and clearing of subungual debris. No ulceration, no infection noted.  Discussed this peroneal problem with this patient.  She is scheduled with the  medical podiatrists for evaluation and future brace support..  Return Visit-Office Procedure: Patient instructed to return to the office for a follow up visit 3 months for preventative foot care    Gardiner Barefoot DPM

## 2019-02-14 ENCOUNTER — Ambulatory Visit: Payer: Medicare Other | Admitting: Gynecologic Oncology

## 2019-02-17 ENCOUNTER — Encounter: Payer: Self-pay | Admitting: Gynecologic Oncology

## 2019-02-17 ENCOUNTER — Other Ambulatory Visit: Payer: Self-pay

## 2019-02-17 ENCOUNTER — Inpatient Hospital Stay: Payer: Medicare Other | Attending: Gynecologic Oncology | Admitting: Gynecologic Oncology

## 2019-02-17 VITALS — BP 152/75 | HR 76 | Temp 97.8°F | Resp 18 | Ht 67.0 in | Wt 204.5 lb

## 2019-02-17 DIAGNOSIS — I4891 Unspecified atrial fibrillation: Secondary | ICD-10-CM | POA: Insufficient documentation

## 2019-02-17 DIAGNOSIS — Z7901 Long term (current) use of anticoagulants: Secondary | ICD-10-CM | POA: Diagnosis not present

## 2019-02-17 DIAGNOSIS — Z952 Presence of prosthetic heart valve: Secondary | ICD-10-CM | POA: Insufficient documentation

## 2019-02-17 DIAGNOSIS — Z7982 Long term (current) use of aspirin: Secondary | ICD-10-CM | POA: Insufficient documentation

## 2019-02-17 DIAGNOSIS — C519 Malignant neoplasm of vulva, unspecified: Secondary | ICD-10-CM | POA: Diagnosis not present

## 2019-02-17 DIAGNOSIS — E669 Obesity, unspecified: Secondary | ICD-10-CM | POA: Insufficient documentation

## 2019-02-17 DIAGNOSIS — I110 Hypertensive heart disease with heart failure: Secondary | ICD-10-CM | POA: Diagnosis not present

## 2019-02-17 DIAGNOSIS — Z87891 Personal history of nicotine dependence: Secondary | ICD-10-CM | POA: Insufficient documentation

## 2019-02-17 DIAGNOSIS — I700 Atherosclerosis of aorta: Secondary | ICD-10-CM | POA: Insufficient documentation

## 2019-02-17 DIAGNOSIS — Z7189 Other specified counseling: Secondary | ICD-10-CM

## 2019-02-17 DIAGNOSIS — E119 Type 2 diabetes mellitus without complications: Secondary | ICD-10-CM | POA: Insufficient documentation

## 2019-02-17 DIAGNOSIS — Z79899 Other long term (current) drug therapy: Secondary | ICD-10-CM | POA: Insufficient documentation

## 2019-02-17 DIAGNOSIS — Z7984 Long term (current) use of oral hypoglycemic drugs: Secondary | ICD-10-CM | POA: Diagnosis not present

## 2019-02-17 NOTE — Progress Notes (Signed)
Consult Note: Gyn-Onc  Consult was requested by Dr. Landry Mellow for the evaluation of Diane [name] 78 y.o. female  CC:  Chief Complaint  Patient presents with  . Vulvar Cancer    Assessment/Plan:  [name]  is a 78 y.o.  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  Recommend close surveillance with repeat exam in 6 months.  Patient reporting heavy vaginal bleeding from incision bed however on exam this wasn't evident. Recommend she hold eloquis for1 week and restart if bleeding improved.  HPI: Diane [name] is a 78 year old woman who was seen in consultation at the request of Dr. 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  In a remote history the patient was seeing my partner, Dr. Nancy Marus up until 2016.  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  This was treated with a radical left posterior vulvectomy with bilateral inguinal lymphadenectomy.  Final pathology showed no metastatic disease.  She required no adjuvant therapy at that time.  She remained free of disease for 5 years and was discharged from care.  In December 2019 she underwent open heart surgery for a mitral and tricuspid valve replacement.  Following the surgery she was placed on Coumadin therapy and began experiencing intermittent vaginal spotting.  She was seen by Dr. Landry Mellow on December 25, 2018.  Dr. Landry Mellow performed a gynecologic evaluation and upon inspection of the external genitalia identified a fleshy pink exophytic 1.5 cm growth in the left introitus posteriorly.  This was biopsied on December 30, 2018 revealed high-grade vulvar intraepithelial neoplasia, VIN 3.  The biopsy margins were involved.  She reported a history of left inguinal discomfort and pain with elevation of the leg that she  had noted for approximately 6 months prior to this diagnosis of recurrence.  It had been worked up and evaluated by her orthopedic surgeon but not felt to be a primary hip issue or surgical issue.  The patient's medical history is significant for obesity, valvular heart disease (status post open heart surgery with mitral and tricuspid valve replacement in December 2019 with Dr. Roxy Manns), the patient's cardiologist is Dr. Terrence Dupont.  She also has atrial fibrillation since 2016.  She takes Coumadin and aspirin 81 mg.  She has a history of severe restrictive lung disease.  Patient has a history of right-sided breast cancer treated with Dr. Markus Jarvis and [place].  For this years required surgery, radiation, and chemotherapy.  She has aortic atherosclerosis.  She has hypertension and diabetes mellitus treated with oral agents.  Her surgical history significant for a left radical vulvectomy and bilateral inguinal lymphadenectomy in 2011.  She has had a remote history of hysterectomy for abnormal uterine bleeding but no malignancy.  Ovaries removed at the same time.  Patient has had orthopedic surgery on the thumb and rotator cuff.  She had mitral and tricuspid cuspid valve repair in 2019.  She has had cataract surgery.  Patient is an ex-smoker but quit in 2011.  She had a 50-pack-year history prior to that.  She lives alone.  Interval Hx:  On February 06, 2019 she underwent a radical posterior left vulvectomy.  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  It had a depth of invasion of  1 mm.  No lymphovascular space invasion was identified.  The peripheral margins were negative for invasive carcinoma as was the deep margin.  The deep margin abutted the anterior rectal wall.  This margin was 4 mm from the tumor.  The closest peripheral margin was at 9:00 which represented to the midline vaginal introitus and was 6 mm from this margin.  This same 9:00 margin was  involved by VIN 2-3.  Postoperatively the patient reported heavy vaginal bleeding.  She restarted her Eliquis on postoperative day 1.  Current Meds:  Outpatient Encounter Medications as of 02/17/2019  Medication Sig  . acetaminophen (TYLENOL) 500 MG tablet Take 500 mg by mouth every 6 (six) hours as needed (for pain.).  [name] Kitchen aspirin EC 81 MG tablet Take 81 mg by mouth daily.  . busPIRone (BUSPAR) 5 MG tablet Take 5 mg by mouth 2 (two) times daily as needed (anxiety).   . furosemide (LASIX) 40 MG tablet Take 1 tablet (40 mg total) by mouth daily.  [name] Kitchen gabapentin (NEURONTIN) 300 MG capsule Take 300 mg by mouth at bedtime.   . Lancets (ONETOUCH DELICA PLUS [id number]) MISC USE AS DIRECTED DAILY FOR 30 DAYS  . loratadine (CLARITIN) 10 MG tablet Take 10 mg by mouth daily as needed for allergies.  . metFORMIN (GLUCOPHAGE-XR) 500 MG 24 hr tablet Take 1 tablet (500 mg total) by mouth daily with breakfast. Total dose of 1000mg  daily (Patient taking differently: Take 500 mg by mouth daily. )  . nitroGLYCERIN (NITROSTAT) 0.4 MG SL tablet Place 0.4 mg under the tongue every 5 (five) minutes x 3 doses as needed for chest pain.   Glory Rosebush VERIO test strip FOR USE WHEN CHECKING BLOOD GLUCOSE ONCE DAILY ALTERNATING AM AND PM BEFORE MEALS FINGER STICK 90 DAYS  . oxyCODONE (ROXICODONE) 5 MG immediate release tablet Take 1 tablet (5 mg total) by mouth every 4 (four) hours as needed for severe pain.  [name] Kitchen Potassium Chloride ER 20 MEQ TBCR Take 20 mEq by mouth daily.   . rosuvastatin (CRESTOR) 5 MG tablet Take 5 mg by mouth daily.  [name] Kitchen senna (SENOKOT) 8.6 MG TABS tablet Take 1 tablet (8.6 mg total) by mouth at bedtime.  [name] Kitchen spironolactone (ALDACTONE) 25 MG tablet Take 1 tablet (25 mg total) by mouth daily.  [name] Kitchen tetrahydrozoline 0.05 % ophthalmic solution Place 1 drop into both eyes 3 (three) times daily as needed (dry/irritated eyes).  . traZODone (DESYREL) 50 MG tablet Take 50 mg by mouth at bedtime as needed for sleep.   No  facility-administered encounter medications on file as of 02/17/2019.     Allergy:  Allergies  Allergen Reactions  . Shellfish Allergy Anaphylaxis    Social Hx:   Social History   Socioeconomic History  . Marital status: Widowed    Spouse name: Not on file  . Number of children: Not on file  . Years of education: Not on file  . Highest education level: Not on file  Occupational History  . Occupation: retired  Scientific laboratory technician  . Financial resource strain: Not on file  . Food insecurity    Worry: Not on file    Inability: Not on file  . Transportation needs    Medical: Not on file    Non-medical: Not on file  Tobacco Use  . Smoking status: Former Smoker    Years: 50.00    Types: Cigarettes    Quit date: 05/29/2009    Years since quitting: 9.7  . Smokeless tobacco:  Never Used  Substance and Sexual Activity  . Alcohol use: Yes    Comment: rare  . Drug use: No  . Sexual activity: Not Currently  Lifestyle  . Physical activity    Days per week: Not on file    Minutes per session: Not on file  . Stress: Not on file  Relationships  . Social Herbalist on phone: Not on file    Gets together: Not on file    Attends religious service: Not on file    Active member of club or organization: Not on file    Attends meetings of clubs or organizations: Not on file    Relationship status: Not on file  . Intimate partner violence    Fear of current or ex partner: Not on file    Emotionally abused: Not on file    Physically abused: Not on file    Forced sexual activity: Not on file  Other Topics Concern  . Not on file  Social History Narrative   Tobacco use cigarettes: former smoker, quit in year Sept 2011, Pack-year Hx: 68, tobacco history last updated 10/10/2013. No smoking. Per patient stopped smoking 9 months ago. No alcohol. Caffeine.: yes, 2+ servings daily, tea. No recreational drug use. Exercise: [employer] water [employer]. Marital status: widow.    Past Surgical Hx:   Past Surgical History:  Procedure Laterality Date  . ABDOMINAL HYSTERECTOMY    . BREAST BIOPSY Right 09/04/2012  . BREAST LUMPECTOMY Right 10/08/2012  . CLIPPING OF ATRIAL APPENDAGE N/A 05/09/2018   Procedure: CLIPPING OF ATRIAL APPENDAGE USING ATRICLIP PRO2 CLIP SIZE 45MM;  Surgeon: Rexene Alberts, MD;  Location: [facility];  Service: Open Heart Surgery;  Laterality: N/A;  . COLONOSCOPY    . DILATION AND CURETTAGE OF UTERUS    . LEFT HEART CATHETERIZATION WITH CORONARY ANGIOGRAM N/A 09/14/2014   Procedure: LEFT HEART CATHETERIZATION WITH CORONARY ANGIOGRAM;  Surgeon: [name], MD;  Location: [facility] C[facility];  Service: Cardiovascular;  Laterality: N/A;  . MAZE N/A 05/09/2018   Procedure: MAZE;  Surgeon: Rexene Alberts, MD;  Location: [facility];  Service: Open Heart Surgery;  Laterality: N/A;  . MITRAL VALVE REPAIR N/A 05/09/2018   Procedure: MITRAL VALVE REPAIR (MVR) USING MEMO 4D RING SIZE 26MM;  Surgeon: Rexene Alberts, MD;  Location: [facility];  Service: Open Heart Surgery;  Laterality: N/A;  . OPEN REDUCTION INTERNAL FIXATION (ORIF) PROXIMAL PHALANX Right 01/04/2016   Procedure: OPEN TREATMENT OF RIGHT THUMB PROXIMAL PHALANX FRACTURE;  Surgeon: Milly Jakob, MD;  Location: [facility];  Service: Orthopedics;  Laterality: Right;  . PARTIAL MASTECTOMY WITH NEEDLE LOCALIZATION AND AXILLARY SENTINEL LYMPH NODE BX Right 10/08/2012   Procedure: RIGHT PARTIAL MASTECTOMY WITH NEEDLE LOCALIZATION AND RIGHT AXILLARY SENTINEL LYMPH NODE BX;  Surgeon: Adin Hector, MD;  Location: [place];  Service: General;  Laterality: Right;  Injection of 1% Methylene Blue dye into right breast  . PORTACATH PLACEMENT Left 10/08/2012   Procedure: INSERTION PORT-A-CATH WITH ULTRASOUND GUIDANCE;  Surgeon: Adin Hector, MD;  Location: [facility];  Service: General;  Laterality: Left;  Using 64F [place]  . Posterior Lumbar Arthrodesis, Pedicle screw fixation, Posterolateral Arthodesis  03/17/11  .  RIGHT/LEFT HEART CATH AND CORONARY ANGIOGRAPHY N/A 05/02/2018   Procedure: RIGHT/LEFT HEART CATH AND CORONARY ANGIOGRAPHY;  Surgeon: [name], MD;  Location: [facility];  Service: Cardiovascular;  Laterality: N/A;  . SHOULDER ARTHROSCOPY WITH ROTATOR CUFF REPAIR AND  SUBACROMIAL DECOMPRESSION  06/04/2012   Procedure: SHOULDER ARTHROSCOPY WITH ROTATOR CUFF REPAIR AND SUBACROMIAL DECOMPRESSION;  Surgeon: Nita Sells, MD;  Location: [facility];  Service: Orthopedics;  Laterality: Left;  LEFT SHOULDER ARTHROSCOPY WITH ROTATOR CUFF REPAIR AND SUBACROMIAL DECOMPRESSION AND DISTAL CLAVICLE RESECTION  . TEE WITHOUT CARDIOVERSION N/A 05/03/2018   Procedure: TRANSESOPHAGEAL ECHOCARDIOGRAM (TEE);  Surgeon: Dixie Dials, MD;  Location: [facility];  Service: Cardiovascular;  Laterality: N/A;  . TEE WITHOUT CARDIOVERSION N/A 05/09/2018   Procedure: TRANSESOPHAGEAL ECHOCARDIOGRAM (TEE);  Surgeon: Rexene Alberts, MD;  Location: [place];  Service: Open Heart Surgery;  Laterality: N/A;  . TONSILLECTOMY    . TRICUSPID VALVE REPLACEMENT N/A 05/09/2018   Procedure: TRICUSPID VALVE REPAIR USING MC3 RING SIZE 28MM;  Surgeon: Rexene Alberts, MD;  Location: [place];  Service: Open Heart Surgery;  Laterality: N/A;  . TUBAL LIGATION    . VULVECTOMY Left 02/06/2019   Procedure: POSTERIOR RADICAL VULVECTOMY;  Surgeon: Everitt Amber, MD;  Location: [facility];  Service: Gynecology;  Laterality: Left;  [name] Kitchen VULVECTOMY PARTIAL    . Wide Local Excision  01/2010   Bilateral groin excisions, re-excision of vulva    Past Medical Hx:  Past Medical History:  Diagnosis Date  . Anxiety   . Arthritis    back  . Atrial fibrillation ([place])   . Breast cancer ([place])    right breast cancer - 3 years ago  . Chronic diastolic heart failure (HCC)    ECHO: 06/20/2017 - Grade 2 Diastolic Dysfunction   . Coronary artery disease   . Depression   . Diabetes mellitus without complication (HCC)    diet  .  Dysrhythmia    afib  . Fibromyalgia   . Herniated disc   . Hyperlipemia   . Hypertension   . Iron deficiency anemia   . Longstanding persistent atrial fibrillation   . Lower back pain   . Lumbar stenosis    L4-5  . Personal history of chemotherapy   . Personal history of radiation therapy   . Pulmonary hypertension ([place])   . S/P Maze operation for atrial fibrillation 05/09/2018   Complete bilateral atrial lesion set using bipolar radiofrequency and cryothermy ablation with clipping of LA appendage  . 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 S/P radiation therapy 01/23/2013-03/06/2013   Right breast / 46 Gy in 23 fractions / Right breast boost / 14 Gy in 7 fractions  . 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 Severe mitral valve regurgitation   . Spondylolysis   . 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 Status post chemotherapy 11/04/2012 - 01/06/2013.   Docetaxel/Cytoxan Q 3 Weeks x 4 cycles  . Torn rotator cuff   . Tricuspid regurgitation     Past Gynecological History:  See HPI. Hx of vulvectomy for vulvar cancer 2011. No LMP recorded. Patient has had a hysterectomy.  Family Hx:  Family History  Problem Relation Age of Onset  . Stroke Mother   . Alzheimer's disease Mother   . Heart attack Father   . Heart attack Brother   . Breast cancer Neg Hx     Review of Systems:  Constitutional  Feels well,   ENT Normal appearing ears and nares bilaterally Skin/Breast  No rash, sores, jaundice, itching, dryness Cardiovascular  No chest pain, shortness of breath,  or edema  Pulmonary  No cough or wheeze.  Gastro Intestinal  No nausea, vomitting, or diarrhoea. No bright red blood per rectum, no abdominal pain, change in bowel movement, or constipation.  Genito Urinary  No frequency, urgency, dysuria,  Musculo Skeletal  + left inguinal pain with elevation of the  leg Neurologic  No weakness, numbness, change in gait,  Psychology  No depression, anxiety, insomnia.   Vitals:  Blood pressure (!) 152/75, pulse 76, temperature 97.8 F (36.6 C), temperature source Oral, resp. rate 18, height 5\' 7"  (1.702 m), weight 204 lb 8 oz (92.8 kg), SpO2 100 %.  Physical Exam: WD in NAD Neck  Supple NROM, without any enlargements.  Lymph Node Survey No cervical supraclavicular or inguinal adenopathy Cardiovascular  Pulse normal rate, regularity and rhythm. S1 and S2 normal.  Lungs  Clear to auscultation bilateraly, without wheezes/crackles/rhonchi. Good air movement.  Skin  No rash/lesions/breakdown  Psychiatry  Alert and oriented to person, place, and time  Abdomen  Normoactive bowel sounds, abdomen soft, non-tender and obese without evidence of hernia.  Back No CVA tenderness Genito Urinary  Vulva/vagina: Normal external female genitalia.  The vulvovaginal incision is healing normally.  There is some wound separation inside the vaginal canal.  There is no active bleeding.  There is only mild ooze.  Nothing to cauterize.  No infection with cellulitis.  Bladder/urethra:  No lesions or masses, well supported bladder  Vagina: grossly normal and free of lesions  Cervix and uterus surgically absent  Adnexa: no palpable masses. Rectal  deferred Extremities  No bilateral cyanosis, clubbing or edema.   30 minutes of direct face to face counseling time was spent with the patient. This included discussion about prognosis, therapy recommendations and postoperative side effects and are beyond the scope of routine postoperative care.   Thereasa Solo, MD  02/17/2019, 5:20 PM

## 2019-02-17 NOTE — Patient Instructions (Signed)
Dr Denman George recommends stopping eloquis for 1 week. If bleeding has improved at that time, you can try restarting it, but if the bleeding picks up again you should stop again for another week before restarting.   There was a stage IA cancer found in your specimen. The edges (margins) were negative for cancer.  Radiation is not recommended at this time.  Dr Denman George recommends follow-up with her to look for recurrence in 6 months.  Please contact Dr Serita Grit office (at [phone]) in January, 2021 to request an appointment with her for March, 2021.

## 2019-02-25 ENCOUNTER — Other Ambulatory Visit: Payer: Self-pay

## 2019-02-25 ENCOUNTER — Encounter: Payer: Self-pay | Admitting: Podiatry

## 2019-02-25 ENCOUNTER — Ambulatory Visit (INDEPENDENT_AMBULATORY_CARE_PROVIDER_SITE_OTHER): Payer: Medicare Other | Admitting: Podiatry

## 2019-02-25 DIAGNOSIS — M76821 Posterior tibial tendinitis, right leg: Secondary | ICD-10-CM | POA: Diagnosis not present

## 2019-02-25 DIAGNOSIS — M778 Other enthesopathies, not elsewhere classified: Secondary | ICD-10-CM

## 2019-02-25 DIAGNOSIS — M7751 Other enthesopathy of right foot: Secondary | ICD-10-CM | POA: Diagnosis not present

## 2019-02-26 ENCOUNTER — Encounter: Payer: Self-pay | Admitting: Podiatry

## 2019-02-26 NOTE — Progress Notes (Signed)
She presents today for chief complaint of pain to the medial lateral aspect of the right ankle.  On referral from Dr. Sharyon Cable reviewed past medical history medications and allergies.  Objective: Vital signs are stable alert oriented x3.  Pulses are palpable right.  She has tenderness and palpable on palpation of the midfoot particularly underneath the posterior tibial insertion site of the midfoot.  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  Plan: At this point placed her in a Tri-Lock brace I injected the plantar midfoot and I would like to follow-up with her in 6 weeks.

## 2019-04-06 IMAGING — CR DG CHEST 2V
2 series · 2 of 2 positions shown · non-contrast
Comparison: Radiographs 05/15/2018 and 05/13/2018.

CLINICAL DATA: Left anterior chest pain. History of open heart
surgery and atrial fibrillation.

EXAM:
CHEST - 2 VIEW

[w chest pa]
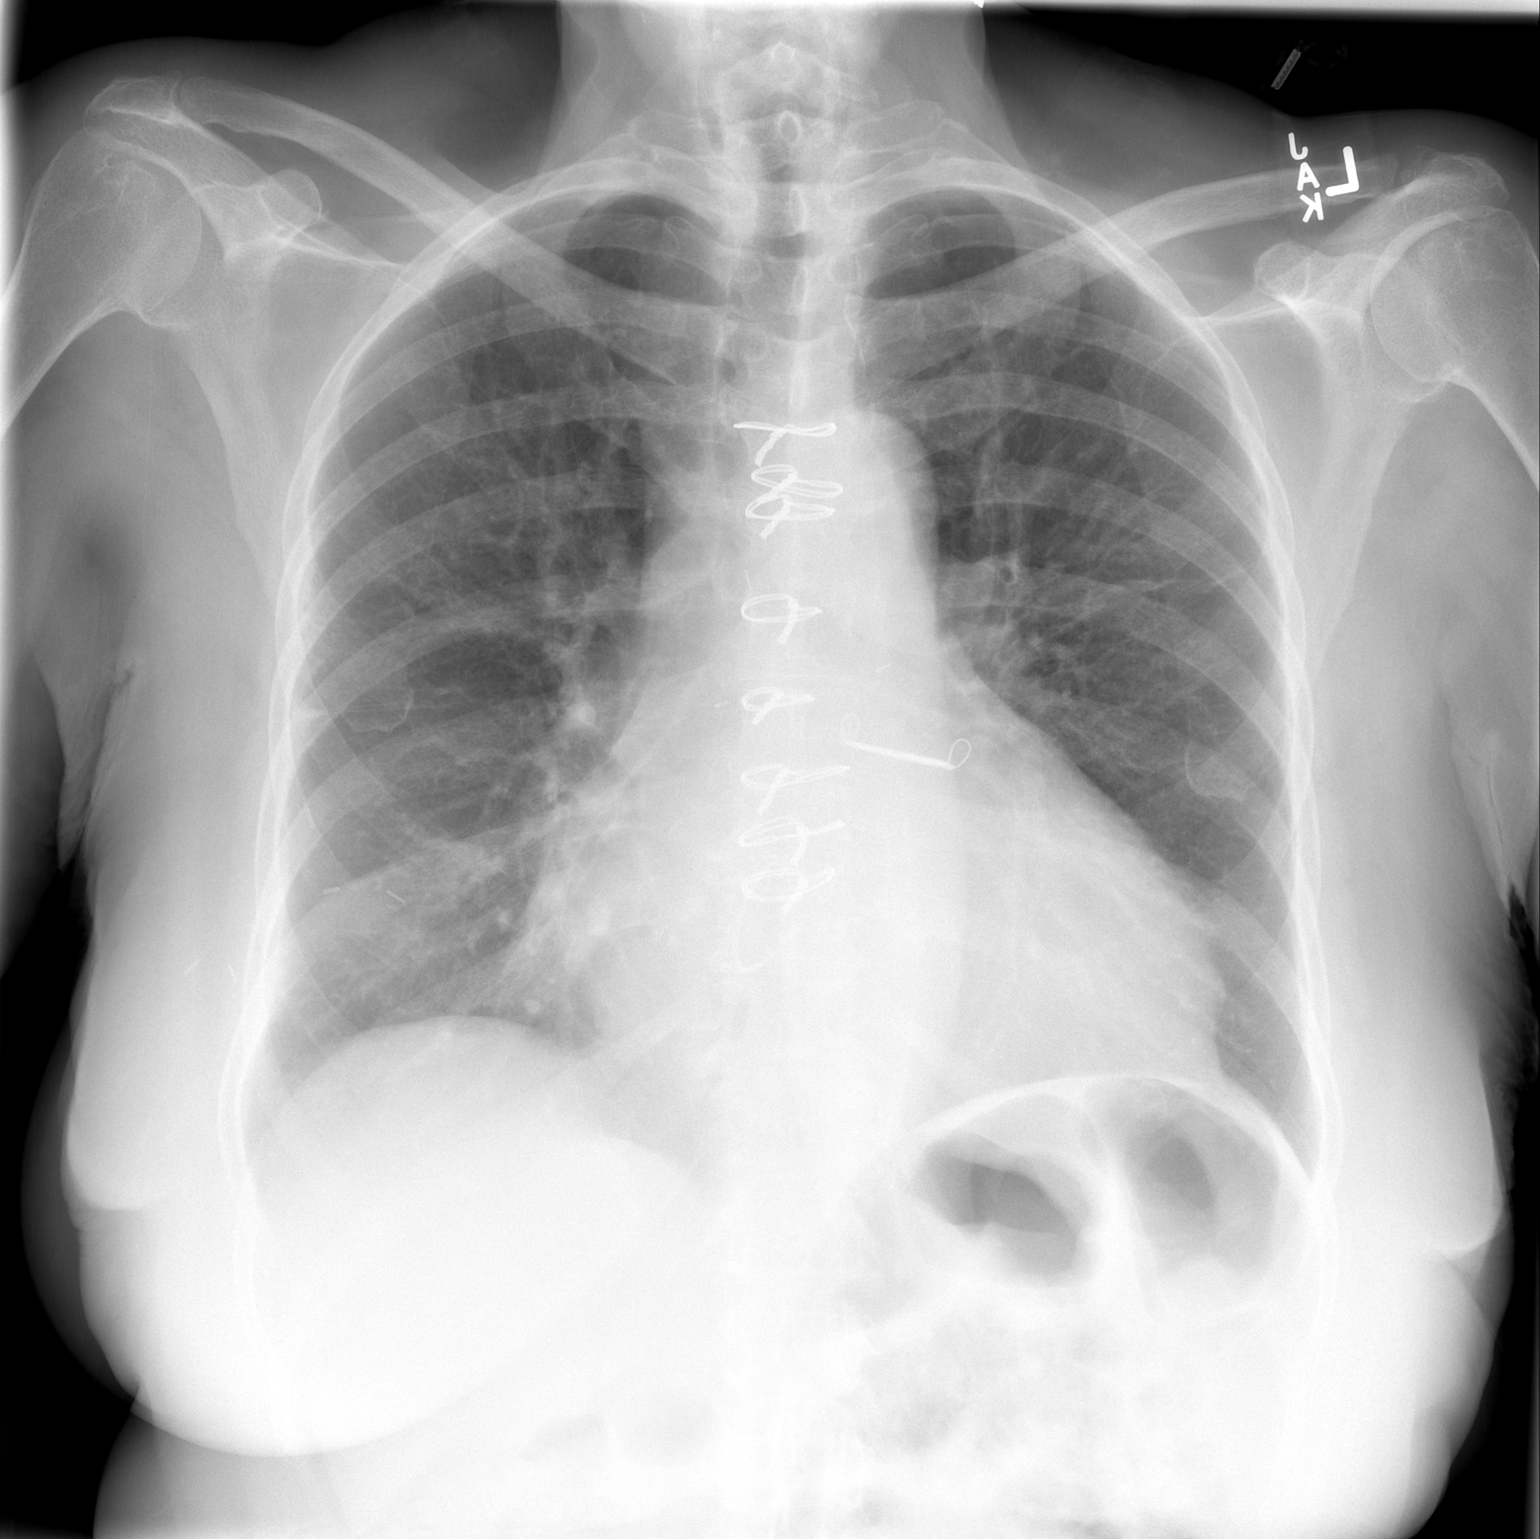

[w chest lat]
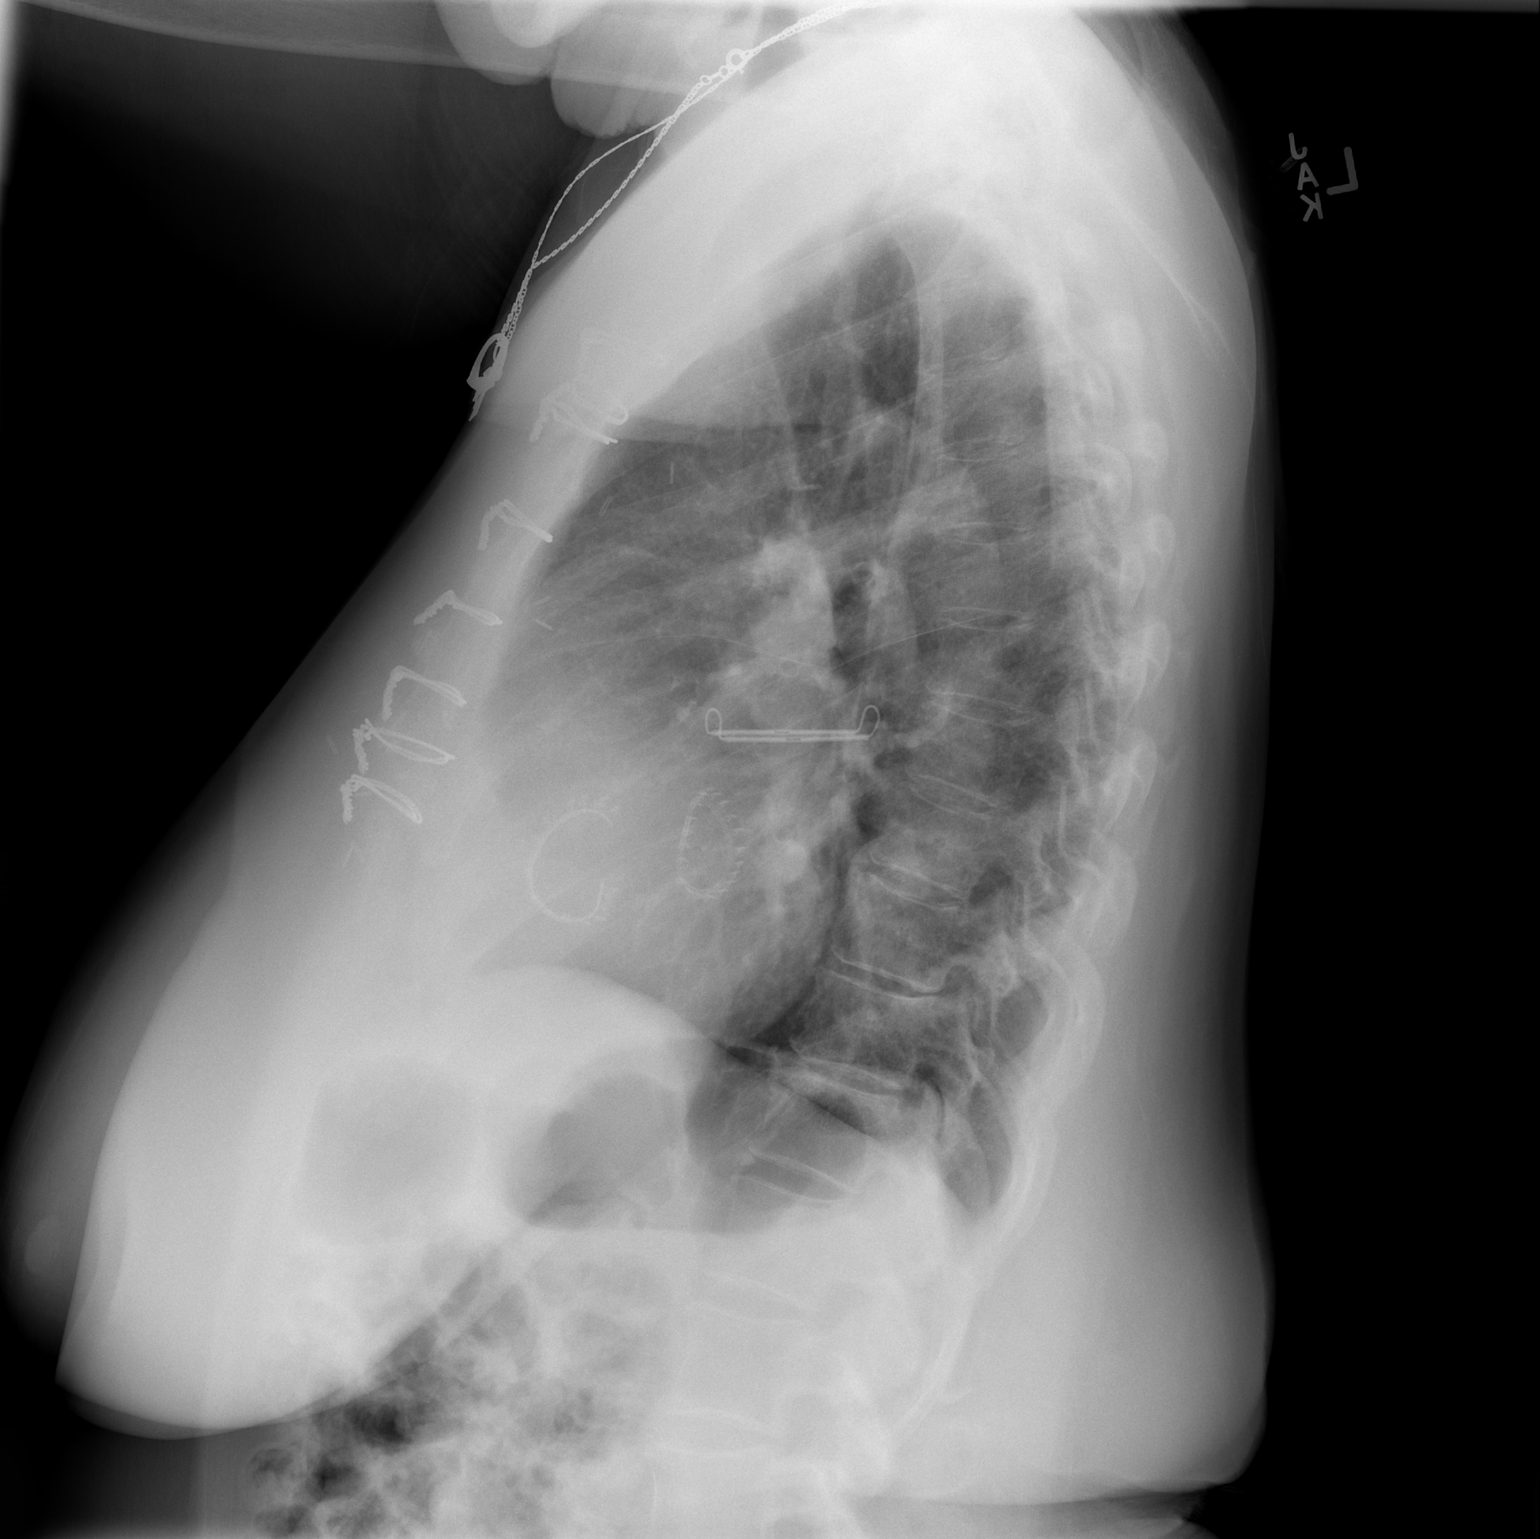

[2 of 2 positions shown; findings below may reference images not displayed]

FINDINGS: Stable cardiomegaly status post median sternotomy, mitral and
tricuspid valve surgery and left atrial appendage clipping. There is
interval improved aeration of the lung bases with near complete
resolution of the previously demonstrated basilar atelectasis and
small pleural effusions. There is no edema, confluent airspace
opacity or pneumothorax. The bones appear unchanged.
IMPRESSION: Resolving postsurgical changes status post recent open heart
surgery. Stable cardiomegaly. No acute findings.

## 2019-04-08 ENCOUNTER — Other Ambulatory Visit: Payer: Self-pay

## 2019-04-08 ENCOUNTER — Ambulatory Visit (INDEPENDENT_AMBULATORY_CARE_PROVIDER_SITE_OTHER): Payer: Medicare Other | Admitting: Podiatry

## 2019-04-08 DIAGNOSIS — M6788 Other specified disorders of synovium and tendon, other site: Secondary | ICD-10-CM

## 2019-04-08 DIAGNOSIS — E1140 Type 2 diabetes mellitus with diabetic neuropathy, unspecified: Secondary | ICD-10-CM | POA: Diagnosis not present

## 2019-04-08 DIAGNOSIS — M79671 Pain in right foot: Secondary | ICD-10-CM

## 2019-04-09 ENCOUNTER — Encounter: Payer: Self-pay | Admitting: Podiatry

## 2019-04-09 NOTE — Progress Notes (Signed)
Subjective:  Patient ID: Diane [name], female    DOB: 1940/06/22,  MRN: [id number]  Chief Complaint  Patient presents with  . Foot Pain    pt is here for a 6 week f/u on tendinitis, pt states that she is doing alot better when she is wearing the trilock brace, pain is elevated when not wearing the brace    78 y.o. female presents with the above complaint.  Patient states that she is here for follow-up follow-up for a peroneal tendinitis of the right foot.  She states that she is doing much better while she was wearing the brace however patient states that as she takes off the brace her pain increases again.  Patient is well-known to Dr. Milinda Pointer and Dr. Milinda Pointer spoke to the patient about obtaining an [place] brace if the boot therapy with injections helped.  She states that she is here today to evaluate her peroneal tendinitis pain as well as schedule herself with Liliane Channel for [place] brace for the right foot.  She denies any other acute complaints.   Review of Systems: Negative except as noted in the HPI. Denies N/V/F/Ch.  Past Medical History:  Diagnosis Date  . Anxiety   . Arthritis    back  . Atrial fibrillation ([place])   . Breast cancer ([place])    right breast cancer - 3 years ago  . Chronic diastolic heart failure (HCC)    ECHO: 06/20/2017 - Grade 2 Diastolic Dysfunction   . Coronary artery disease   . Depression   . Diabetes mellitus without complication (HCC)    diet  . Dysrhythmia    afib  . Fibromyalgia   . Herniated disc   . Hyperlipemia   . Hypertension   . Iron deficiency anemia   . Longstanding persistent atrial fibrillation ([place])   . Lower back pain   . Lumbar stenosis    L4-5  . Personal history of chemotherapy   . Personal history of radiation therapy   . Pulmonary hypertension ([place])   . S/P Maze operation for atrial fibrillation 05/09/2018   Complete bilateral atrial lesion set using bipolar radiofrequency and cryothermy ablation with clipping of LA  appendage  . 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 S/P radiation therapy 01/23/2013-03/06/2013   Right breast / 46 Gy in 23 fractions / Right breast boost / 14 Gy in 7 fractions  . 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 Severe mitral valve regurgitation   . Spondylolysis   . 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 Status post chemotherapy 11/04/2012 - 01/06/2013.   Docetaxel/Cytoxan Q 3 Weeks x 4 cycles  . Torn rotator cuff   . Tricuspid regurgitation     Current Outpatient Medications:  .  acetaminophen (TYLENOL) 500 MG tablet, Take 500 mg by mouth every 6 (six) hours as needed (for pain.)., Disp: , Rfl:  .  aspirin EC 81 MG tablet, Take 81 mg by mouth daily., Disp: , Rfl:  .  busPIRone (BUSPAR) 5 MG tablet, Take 5 mg by mouth 2 (two) times daily as needed (anxiety). , Disp: , Rfl:  .  ELIQUIS 2.5 MG TABS tablet, Take 2.5 mg by mouth 2 (two) times daily., Disp: , Rfl:  .  furosemide (LASIX) 40 MG tablet, Take 1 tablet (40 mg total) by mouth daily., Disp: 30 tablet, Rfl: 1 .  gabapentin (NEURONTIN) 300 MG capsule, Take 300 mg by mouth at bedtime. , Disp: , Rfl:  .  Lancets (ONETOUCH DELICA PLUS [id number]) MISC, USE AS DIRECTED DAILY FOR 30 DAYS, Disp: , Rfl:  .  loratadine (CLARITIN) 10 MG tablet, Take 10 mg by mouth daily as needed for allergies., Disp: , Rfl:  .  metFORMIN (GLUCOPHAGE-XR) 500 MG 24 hr tablet, Take 1 tablet (500 mg total) by mouth daily with breakfast. Total dose of 1000mg  daily (Patient taking differently: Take 500 mg by mouth daily. ), Disp: 30 tablet, Rfl: 3 .  nitroGLYCERIN (NITROSTAT) 0.4 MG SL tablet, Place 0.4 mg under the tongue every 5 (five) minutes x 3 doses as needed for chest pain. , Disp: , Rfl:  .  ONETOUCH VERIO test strip, FOR USE WHEN CHECKING BLOOD GLUCOSE ONCE DAILY ALTERNATING AM AND PM BEFORE MEALS FINGER STICK 90 DAYS, Disp: , Rfl: 3 .   oxyCODONE (ROXICODONE) 5 MG immediate release tablet, Take 1 tablet (5 mg total) by mouth every 4 (four) hours as needed for severe pain., Disp: 15 tablet, Rfl: 0 .  Potassium Chloride ER 20 MEQ TBCR, Take 20 mEq by mouth daily. , Disp: , Rfl:  .  rosuvastatin (CRESTOR) 5 MG tablet, Take 5 mg by mouth daily., Disp: , Rfl:  .  senna (SENOKOT) 8.6 MG TABS tablet, Take 1 tablet (8.6 mg total) by mouth at bedtime., Disp: 30 tablet, Rfl: 1 .  spironolactone (ALDACTONE) 25 MG tablet, Take 1 tablet (25 mg total) by mouth daily., Disp: 30 tablet, Rfl: 1 .  tetrahydrozoline 0.05 % ophthalmic solution, Place 1 drop into both eyes 3 (three) times daily as needed (dry/irritated eyes)., Disp: , Rfl:  .  traZODone (DESYREL) 50 MG tablet, Take 50 mg by mouth at bedtime as needed for sleep., Disp: , Rfl:   Social History   Tobacco Use  Smoking Status Former Smoker  . Years: 50.00  . Types: Cigarettes  . Quit date: 05/29/2009  . Years since quitting: 9.8  Smokeless Tobacco Never Used    Allergies  Allergen Reactions  . Shellfish Allergy Anaphylaxis   Objective:  There were no vitals filed for this visit. There is no height or weight on file to calculate BMI. Constitutional Well developed. Well nourished.  Vascular Dorsalis pedis pulses palpable bilaterally. Posterior tibial pulses palpable bilaterally. Capillary refill normal to all digits.  No cyanosis or clubbing noted. Pedal hair growth normal.  Neurologic Normal speech. Oriented to person, place, and time. Epicritic sensation to light touch grossly present bilaterally.  Dermatologic Nails well groomed and normal in appearance. No open wounds. No skin lesions.  Orthopedic:  Pain on palpation to the peroneal tendon at the insertion as well as the course of the tendon.  Pain on inversion as well as eversion active and passive range of motion. No pain on palpation to the posterior tibial tendon.  No pain elicited it with active and passive range  of motion of the ankle joint including eversion and inversion at the posterior tibial tendon.   Radiographs: None Assessment:   1. Type 2 diabetes, controlled, with neuropathy ([place])   2. Right peroneal tendinosis   3. Pain in right foot    Plan:  Patient was evaluated and treated and all questions answered.  Right peroneal tendinitis -It appears that patient is doing well with Tri-Lock ankle brace however patient is not able to come off the brace as the pain returns as soon as she takes off the  brace.  At this point Dr. Milinda Pointer had recommended that patient obtain an [place] brace for more definitive treatment. -Patient will be scheduled for to see Liliane Channel for [facility] brace of the right foot to address the peroneal tendinitis.  Return if symptoms worsen or fail to improve, for [facility] for [place] brace.

## 2019-04-17 ENCOUNTER — Ambulatory Visit: Payer: Medicare Other | Admitting: Orthotics

## 2019-04-17 ENCOUNTER — Other Ambulatory Visit: Payer: Self-pay

## 2019-04-17 DIAGNOSIS — M25572 Pain in left ankle and joints of left foot: Secondary | ICD-10-CM

## 2019-04-17 DIAGNOSIS — M79671 Pain in right foot: Secondary | ICD-10-CM

## 2019-04-17 DIAGNOSIS — M6788 Other specified disorders of synovium and tendon, other site: Secondary | ICD-10-CM

## 2019-04-17 NOTE — Progress Notes (Signed)
Patient presents today for evaluation/casting for AFO brace (R.   Patient has hx of the following conditions: Gait instability,  Ankle instabilty,  Gait analysis done and patient displays abnormality of gait in both sagittial and frontal planes, and could benefit in aggressive ankle support.  Patient chose [place] brace w/ lace/speed laces.

## 2019-04-30 ENCOUNTER — Ambulatory Visit
Admission: RE | Admit: 2019-04-30 | Discharge: 2019-04-30 | Disposition: A | Discharge status: Home / Self Care | Payer: Medicare Other | Source: Ambulatory Visit | Attending: Geriatric Medicine | Admitting: Geriatric Medicine

## 2019-04-30 ENCOUNTER — Other Ambulatory Visit: Payer: Self-pay

## 2019-04-30 DIAGNOSIS — R921 Mammographic calcification found on diagnostic imaging of breast: Secondary | ICD-10-CM

## 2019-05-09 ENCOUNTER — Other Ambulatory Visit: Payer: Medicare Other | Admitting: Orthotics

## 2019-05-09 ENCOUNTER — Other Ambulatory Visit: Payer: Self-pay

## 2019-05-09 DIAGNOSIS — M779 Enthesopathy, unspecified: Secondary | ICD-10-CM | POA: Diagnosis not present

## 2019-05-09 DIAGNOSIS — M76821 Posterior tibial tendinitis, right leg: Secondary | ICD-10-CM

## 2019-05-09 DIAGNOSIS — E1140 Type 2 diabetes mellitus with diabetic neuropathy, unspecified: Secondary | ICD-10-CM

## 2019-05-12 ENCOUNTER — Telehealth: Payer: Medicare Other | Admitting: Thoracic Surgery (Cardiothoracic Vascular Surgery)

## 2019-05-13 ENCOUNTER — Other Ambulatory Visit: Payer: Self-pay

## 2019-05-13 ENCOUNTER — Telehealth (INDEPENDENT_AMBULATORY_CARE_PROVIDER_SITE_OTHER): Payer: Medicare Other | Admitting: Thoracic Surgery (Cardiothoracic Vascular Surgery)

## 2019-05-13 DIAGNOSIS — Z9889 Other specified postprocedural states: Secondary | ICD-10-CM

## 2019-05-13 DIAGNOSIS — Z8679 Personal history of other diseases of the circulatory system: Secondary | ICD-10-CM

## 2019-05-13 NOTE — Progress Notes (Signed)
[address]       [place] [address]             [place] VIRTUAL OFFICE NOTE  Referring Provider is [name], MD PCP is Lajean Manes, MD   HPI:  I spoke with Diane [name] (DOB 07-Nov-1940 ) via telephone on 05/13/2019 at 11:33 AM and verified that I was speaking with the correct person using more than one form of identification.  We discussed the reason(s) for conducting our visit virtually instead of in-person.  The patient expressed understanding the circumstances and agreed to proceed as described.   Patient is a 78 year old African-American female with history of mitral regurgitation, persistent atrial fibrillation, hypertension, chronic diastolic congestive heart failure, type 2 diabetes mellitus, hyperlipidemia, iron deficient anemia, arthritis, and depression who returns to the office today for routine follow-up status post mitral valve repair, tricuspid valve repair, and Maze procedure on May 09, 2018 for severe symptomatic primary mitral regurgitation, tricuspid regurgitation, and longstanding persistent atrial fibrillation.  Her early postoperative recovery in the hospital was expectedly slow but overall uncomplicated.  Routine follow-up echocardiogram performed prior to hospital discharge on May 16, 2018 revealed normal left ventricular systolic function with ejection fraction estimated 55 to 60%.  The mitral valve repair was intact with no residual mitral regurgitation.  Mean transvalvular gradient was estimated 4 mmHg.  The tricuspid valve repair was also intact with no residual tricuspid regurgitation.  There was mild to moderate aortic insufficiency, unchanged from preoperatively.  He was eventually discharged on the 11th postoperative day on warfarin anticoagulation.  She was last seen in our office on August 12, 2018 at which time she was doing well.   I spoke with patient over the  telephone today for routine follow-up approximately 1 year following her surgery.  She reports that she is doing extremely well from a cardiac standpoint.  She was seen recently by Dr. Terrence Dupont and she states that she underwent an echocardiogram prior to her surgery last September for cancer.  She reports that she feels dramatically better than she did prior to her heart surgery last December.  She states that she only gets short of breath with more strenuous exertion.  She no longer has to use a walker although she does use a cane for stability with ambulation.  She has not had problems with swelling or volume overload.  She has not had palpitations or other symptoms to suggest a recurrence of atrial fibrillation.  She has no chest pain.  She remains chronically anticoagulated using Eliquis.   Current Outpatient Medications  Medication Sig Dispense Refill  . acetaminophen (TYLENOL) 500 MG tablet Take 500 mg by mouth every 6 (six) hours as needed (for pain.).    [name] Kitchen aspirin EC 81 MG tablet Take 81 mg by mouth daily.    . busPIRone (BUSPAR) 5 MG tablet Take 5 mg by mouth 2 (two) times daily as needed (anxiety).     [name] Kitchen ELIQUIS 2.5 MG TABS tablet Take 2.5 mg by mouth 2 (two) times daily.    . furosemide (LASIX) 40 MG tablet Take 1 tablet (40 mg total) by mouth daily. 30 tablet 1  . gabapentin (NEURONTIN) 300 MG capsule Take 300 mg by mouth at bedtime.     . Lancets (ONETOUCH DELICA PLUS [id number]) MISC USE AS DIRECTED DAILY FOR 30 DAYS    . loratadine (CLARITIN) 10 MG tablet Take 10 mg by  mouth daily as needed for allergies.    . metFORMIN (GLUCOPHAGE-XR) 500 MG 24 hr tablet Take 1 tablet (500 mg total) by mouth daily with breakfast. Total dose of 1000mg  daily (Patient taking differently: Take 500 mg by mouth daily. ) 30 tablet 3  . nitroGLYCERIN (NITROSTAT) 0.4 MG SL tablet Place 0.4 mg under the tongue every 5 (five) minutes x 3 doses as needed for chest pain.     Glory Rosebush VERIO test strip FOR USE WHEN  CHECKING BLOOD GLUCOSE ONCE DAILY ALTERNATING AM AND PM BEFORE MEALS FINGER STICK 90 DAYS  3  . oxyCODONE (ROXICODONE) 5 MG immediate release tablet Take 1 tablet (5 mg total) by mouth every 4 (four) hours as needed for severe pain. 15 tablet 0  . Potassium Chloride ER 20 MEQ TBCR Take 20 mEq by mouth daily.     . rosuvastatin (CRESTOR) 5 MG tablet Take 5 mg by mouth daily.    [name] Kitchen senna (SENOKOT) 8.6 MG TABS tablet Take 1 tablet (8.6 mg total) by mouth at bedtime. 30 tablet 1  . spironolactone (ALDACTONE) 25 MG tablet Take 1 tablet (25 mg total) by mouth daily. 30 tablet 1  . tetrahydrozoline 0.05 % ophthalmic solution Place 1 drop into both eyes 3 (three) times daily as needed (dry/irritated eyes).    . traZODone (DESYREL) 50 MG tablet Take 50 mg by mouth at bedtime as needed for sleep.     No current facility-administered medications for this visit.     Diagnostic Tests:  n/a   Impression:  Patient is doing very well clinically approximately 1 year status post mitral valve repair, tricuspid valve repair, and Maze procedure.  She has been followed carefully by Dr. Terrence Dupont and according to the patient she has not had a recurrence of atrial fibrillation.   Plan:  We have not recommended any change the patient's current medications.  The patient has been reminded regarding the importance of dental hygiene and the lifelong need for antibiotic prophylaxis for all dental cleanings and other related invasive procedures.  She will continue to follow-up regularly with Dr. Terrence Dupont and return to our office in the future only should specific problems or questions arise.    I discussed limitations of evaluation and management via telephone.  The patient was advised to call back for repeat telephone consultation or to seek an in-person evaluation if questions arise or the patient's clinical condition changes in any significant manner.  I spent in excess of 10 minutes of non-face-to-face time during  the conduct of this telephone virtual office consultation.     Valentina Gu. Roxy Manns, MD 05/13/2019 11:33 AM

## 2019-05-13 NOTE — Patient Instructions (Signed)

## 2019-05-14 ENCOUNTER — Encounter: Payer: Self-pay | Admitting: Podiatry

## 2019-05-14 ENCOUNTER — Ambulatory Visit: Payer: Medicare Other | Admitting: Podiatry

## 2019-05-14 ENCOUNTER — Telehealth: Payer: Self-pay | Admitting: *Deleted

## 2019-05-14 ENCOUNTER — Other Ambulatory Visit: Payer: Self-pay

## 2019-05-14 DIAGNOSIS — M79671 Pain in right foot: Secondary | ICD-10-CM

## 2019-05-14 DIAGNOSIS — M79676 Pain in unspecified toe(s): Secondary | ICD-10-CM

## 2019-05-14 DIAGNOSIS — M6788 Other specified disorders of synovium and tendon, other site: Secondary | ICD-10-CM

## 2019-05-14 DIAGNOSIS — B351 Tinea unguium: Secondary | ICD-10-CM | POA: Diagnosis not present

## 2019-05-14 NOTE — Telephone Encounter (Signed)
Dr. Prudence Davidson referred to [facility] for right peroneal tendinosis. Hand delivered to [facility] - In-office.

## 2019-05-14 NOTE — Progress Notes (Addendum)
Patient ID: [place] Drye Verde, female   DOB: 1940-09-09, 78 y.o.   MRN: [id number]   Complaint:  Visit Type: Patient returns to my office for continued preventative foot care services. Complaint: Patient states" my nails have grown long and thick and become painful to walk and wear shoes" Patient has been diagnosed with DM with neuropathy.. The patient presents for preventative foot care services. No changes to ROS.  Patient says she desires diabetic shoes. She says she wears her brace at home since she is unable to drive with the brace.  Podiatric Exam: Vascular: dorsalis pedis and posterior tibial pulses are palpable bilateral. Capillary return is immediate. Temperature gradient is WNL. Skin turgor WNL  Sensorium: Diminished  Semmes Weinstein monofilament test. Normal tactile sensation bilaterally. Nail Exam: Pt has thick disfigured discolored nails with subungual debris noted bilateral entire nail hallux through fifth toenails Ulcer Exam: There is no evidence of ulcer or pre-ulcerative changes or infection. Orthopedic Exam: Muscle tone and strength are WNL. No limitations in general ROM. No crepitus or effusions noted. Foot type and digits show no abnormalities.HAV 1st MPJ B/L. Skin: No Porokeratosis. No infection or ulcers  Diagnosis:  Onychomycosis, , Pain in right toe, pain in left toes  HAV  B/L.  Treatment & Plan Procedures and Treatment: Consent by patient was obtained for treatment procedures. The patient understood the discussion of treatment and procedures well. All questions were answered thoroughly reviewed. Debridement of mycotic and hypertrophic toenails, 1 through 5 bilateral and clearing of subungual debris. No ulceration, no infection noted.  .Patient qualifies for diabetic shoes due to DPN and HAV  B/L.   Return Visit-Office Procedure: Patient instructed to return to the office for a follow up visit 3 months for continued evaluation and treatment.   Gardiner Barefoot DPM

## 2019-06-02 ENCOUNTER — Other Ambulatory Visit: Payer: Self-pay | Admitting: Geriatric Medicine

## 2019-06-02 DIAGNOSIS — R921 Mammographic calcification found on diagnostic imaging of breast: Secondary | ICD-10-CM

## 2019-06-03 ENCOUNTER — Other Ambulatory Visit: Payer: Self-pay

## 2019-06-03 ENCOUNTER — Ambulatory Visit: Payer: Medicare Other | Admitting: Orthotics

## 2019-06-03 DIAGNOSIS — M76821 Posterior tibial tendinitis, right leg: Secondary | ICD-10-CM

## 2019-06-03 DIAGNOSIS — M2011 Hallux valgus (acquired), right foot: Secondary | ICD-10-CM

## 2019-06-03 DIAGNOSIS — M2012 Hallux valgus (acquired), left foot: Secondary | ICD-10-CM

## 2019-06-03 DIAGNOSIS — M79671 Pain in right foot: Secondary | ICD-10-CM

## 2019-06-03 DIAGNOSIS — E1140 Type 2 diabetes mellitus with diabetic neuropathy, unspecified: Secondary | ICD-10-CM

## 2019-06-03 DIAGNOSIS — M6788 Other specified disorders of synovium and tendon, other site: Secondary | ICD-10-CM

## 2019-06-03 NOTE — Progress Notes (Signed)

## 2019-07-14 ENCOUNTER — Ambulatory Visit: Payer: Medicare Other | Attending: Internal Medicine

## 2019-07-14 DIAGNOSIS — Z23 Encounter for immunization: Secondary | ICD-10-CM | POA: Insufficient documentation

## 2019-07-14 NOTE — Progress Notes (Signed)
   Covid-19 Vaccination Clinic  Name:  Diane [name]    MRN: [id number] DOB: [birth year]  07/14/2019  [name] was observed post Covid-19 immunization for 15 minutes without incidence. She was provided with Vaccine Information Sheet and instruction to access the V-Safe system.   [name] was instructed to call 911 with any severe reactions post vaccine: [name] Kitchen Difficulty breathing  . Swelling of your face and throat  . A fast heartbeat  . A bad rash all over your body  . Dizziness and weakness    Immunizations Administered    Name Date Dose VIS Date Route   Pfizer COVID-19 Vaccine 07/14/2019  3:08 PM 0.3 mL 05/09/2019 Intramuscular   Manufacturer: [place]   Lot: [id number]   [place]: [id number]

## 2019-07-28 ENCOUNTER — Ambulatory Visit: Payer: Medicare Other | Admitting: Orthotics

## 2019-07-28 ENCOUNTER — Other Ambulatory Visit: Payer: Self-pay

## 2019-07-28 DIAGNOSIS — M2012 Hallux valgus (acquired), left foot: Secondary | ICD-10-CM

## 2019-07-28 DIAGNOSIS — M216X2 Other acquired deformities of left foot: Secondary | ICD-10-CM | POA: Diagnosis not present

## 2019-07-28 DIAGNOSIS — M2011 Hallux valgus (acquired), right foot: Secondary | ICD-10-CM | POA: Diagnosis not present

## 2019-07-28 DIAGNOSIS — E1140 Type 2 diabetes mellitus with diabetic neuropathy, unspecified: Secondary | ICD-10-CM | POA: Diagnosis not present

## 2019-07-28 DIAGNOSIS — M216X1 Other acquired deformities of right foot: Secondary | ICD-10-CM | POA: Diagnosis not present

## 2019-08-05 ENCOUNTER — Ambulatory Visit: Payer: Medicare Other | Attending: Internal Medicine

## 2019-08-05 DIAGNOSIS — Z23 Encounter for immunization: Secondary | ICD-10-CM | POA: Insufficient documentation

## 2019-08-05 NOTE — Progress Notes (Signed)
   Covid-19 Vaccination Clinic  Name:  Diane [name]    MRN: [id number] DOB: 1940/09/18  08/05/2019  [name] was observed post Covid-19 immunization for 30 minutes based on pre-vaccination screening without incident. She was provided with Vaccine Information Sheet and instruction to access the V-Safe system.   [name] was instructed to call 911 with any severe reactions post vaccine: [name] Kitchen Difficulty breathing  . Swelling of face and throat  . A fast heartbeat  . A bad rash all over body  . Dizziness and weakness   Immunizations Administered    Name Date Dose VIS Date Route   Pfizer COVID-19 Vaccine 08/05/2019 10:19 AM 0.3 mL 05/09/2019 Intramuscular   Manufacturer: [employer]   Lot: [id number]   [place]: [id number]

## 2019-08-06 ENCOUNTER — Ambulatory Visit: Payer: Medicare Other

## 2019-08-19 ENCOUNTER — Encounter: Payer: Self-pay | Admitting: Podiatry

## 2019-08-19 ENCOUNTER — Ambulatory Visit: Payer: Medicare Other | Admitting: Podiatry

## 2019-08-19 ENCOUNTER — Other Ambulatory Visit: Payer: Self-pay

## 2019-08-19 VITALS — Temp 96.8°F

## 2019-08-19 DIAGNOSIS — M79676 Pain in unspecified toe(s): Secondary | ICD-10-CM | POA: Diagnosis not present

## 2019-08-19 DIAGNOSIS — E1140 Type 2 diabetes mellitus with diabetic neuropathy, unspecified: Secondary | ICD-10-CM

## 2019-08-19 DIAGNOSIS — B351 Tinea unguium: Secondary | ICD-10-CM | POA: Diagnosis not present

## 2019-08-19 NOTE — Progress Notes (Signed)
This patient returns to my office for at risk foot care.  This patient requires this care by a professional since this patient will be at risk due to having chronic kidney disease and diabetes.    This patient is unable to cut nails herself since the patient cannot reach her  nails.These nails are painful walking and wearing shoes.  This patient presents for at risk foot care today.  General Appearance  Alert, conversant and in no acute stress.  Vascular  Dorsalis pedis and posterior tibial  pulses are palpable  bilaterally.  Capillary return is within normal limits  bilaterally. Temperature is within normal limits  bilaterally.  Neurologic  Senn-Weinstein monofilament wire test diminished   bilaterally. Muscle power within normal limits bilaterally.  Nails Thick disfigured discolored nails with subungual debris  from hallux to fifth toes bilaterally. No evidence of bacterial infection or drainage bilaterally.  Orthopedic  No limitations of motion  feet .  No crepitus or effusions noted.  No bony pathology or digital deformities noted.  HAV  B/L.    Skin  normotropic skin with no porokeratosis noted bilaterally.  No signs of infections or ulcers noted.     Onychomycosis  Pain in right toes  Pain in left toes  Consent was obtained for treatment procedures.   Mechanical debridement of nails 1-5  bilaterally performed with a nail nipper.  Filed with dremel without incident.   Patient wants to talk with [facility] for balance braces.   Return office visit   3 months                   Told patient to return for periodic foot care and evaluation due to potential at risk complications.   Gardiner Barefoot DPM

## 2019-09-12 ENCOUNTER — Inpatient Hospital Stay: Payer: Medicare Other | Attending: Gynecologic Oncology | Admitting: Gynecologic Oncology

## 2019-09-12 ENCOUNTER — Other Ambulatory Visit: Payer: Self-pay

## 2019-09-12 ENCOUNTER — Encounter: Payer: Self-pay | Admitting: Gynecologic Oncology

## 2019-09-12 VITALS — BP 118/82 | HR 72 | Temp 98.5°F | Resp 17 | Ht 67.0 in | Wt 207.0 lb

## 2019-09-12 DIAGNOSIS — E669 Obesity, unspecified: Secondary | ICD-10-CM | POA: Insufficient documentation

## 2019-09-12 DIAGNOSIS — Z952 Presence of prosthetic heart valve: Secondary | ICD-10-CM | POA: Insufficient documentation

## 2019-09-12 DIAGNOSIS — Z90722 Acquired absence of ovaries, bilateral: Secondary | ICD-10-CM | POA: Diagnosis not present

## 2019-09-12 DIAGNOSIS — Z90710 Acquired absence of both cervix and uterus: Secondary | ICD-10-CM | POA: Insufficient documentation

## 2019-09-12 DIAGNOSIS — Z87891 Personal history of nicotine dependence: Secondary | ICD-10-CM | POA: Insufficient documentation

## 2019-09-12 DIAGNOSIS — I110 Hypertensive heart disease with heart failure: Secondary | ICD-10-CM | POA: Insufficient documentation

## 2019-09-12 DIAGNOSIS — Z7901 Long term (current) use of anticoagulants: Secondary | ICD-10-CM | POA: Insufficient documentation

## 2019-09-12 DIAGNOSIS — C519 Malignant neoplasm of vulva, unspecified: Secondary | ICD-10-CM | POA: Insufficient documentation

## 2019-09-12 DIAGNOSIS — E119 Type 2 diabetes mellitus without complications: Secondary | ICD-10-CM | POA: Insufficient documentation

## 2019-09-12 DIAGNOSIS — Z79899 Other long term (current) drug therapy: Secondary | ICD-10-CM | POA: Insufficient documentation

## 2019-09-12 NOTE — Progress Notes (Signed)
Follow-up Note: Gyn-Onc  Consult was requested by Dr. Landry Mellow for the evaluation of Diane [name] 79 y.o. female  CC:  Chief Complaint  Patient presents with  . Vulva cancer ([place])    Follow up    Assessment/Plan:  [name]  is a 79 y.o.  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 Close (63mm) deep margin and lateral margin also close (17mm).  Recommend close surveillance with repeat exam in 6 months.  HPI: Diane [name] is a 79 year old woman who was seen in consultation at the request of Dr. 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  In a remote history the patient was seeing my partner, Dr. Nancy Marus up until 2016.  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  This was treated with a radical left posterior vulvectomy with bilateral inguinal lymphadenectomy.  Final pathology showed no metastatic disease.  She required no adjuvant therapy at that time.  She remained free of disease for 5 years and was discharged from care.  In December 2019 she underwent open heart surgery for a mitral and tricuspid valve replacement.  Following the surgery she was placed on Coumadin therapy and began experiencing intermittent vaginal spotting.  She was seen by Dr. Landry Mellow on December 25, 2018.  Dr. Landry Mellow performed a gynecologic evaluation and upon inspection of the external genitalia identified a fleshy pink exophytic 1.5 cm growth in the left introitus posteriorly.  This was biopsied on December 30, 2018 revealed high-grade vulvar intraepithelial neoplasia, VIN 3.  The biopsy margins were involved.  She reported a history of left inguinal discomfort and pain with elevation of the leg that she had noted for approximately 6 months prior to this diagnosis of recurrence.  It had been worked up and evaluated by her orthopedic surgeon but not  felt to be a primary hip issue or surgical issue.  The patient's medical history is significant for obesity, valvular heart disease (status post open heart surgery with mitral and tricuspid valve replacement in December 2019 with Dr. Roxy Manns), the patient's cardiologist is Dr. Terrence Dupont.  She also has atrial fibrillation since 2016.  She takes Coumadin and aspirin 81 mg.  She has a history of severe restrictive lung disease.  Patient has a history of right-sided breast cancer treated with Dr. Markus Jarvis and [place].  For this years required surgery, radiation, and chemotherapy.  She has aortic atherosclerosis.  She has hypertension and diabetes mellitus treated with oral agents.  Her surgical history significant for a left radical vulvectomy and bilateral inguinal lymphadenectomy in 2011.  She has had a remote history of hysterectomy for abnormal uterine bleeding but no malignancy.  Ovaries removed at the same time.  Patient has had orthopedic surgery on the thumb and rotator cuff.  She had mitral and tricuspid cuspid valve repair in 2019.  She has had cataract surgery.  Patient is an ex-smoker but quit in 2011.  She had a 50-pack-year history prior to that.  She lives alone.  On February 06, 2019 she underwent a radical posterior left vulvectomy.  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  It had a depth of invasion of 1 mm.  No lymphovascular space invasion was identified.  The peripheral margins were negative for invasive carcinoma as was the deep margin.  The deep margin abutted the anterior rectal wall.  This margin was 4 mm from the tumor.  The closest peripheral margin was at 9:00 which represented to the midline vaginal introitus and was 6 mm from this margin.  This same 9:00 margin was involved by VIN 2-3.  Interval Hx:  She reported no new symptoms of recurrence.   Current Meds:  Outpatient Encounter Medications as of 09/12/2019   Medication Sig  . acetaminophen (TYLENOL) 500 MG tablet Take 500 mg by mouth every 6 (six) hours as needed (for pain.).  [name] Kitchen aspirin EC 81 MG tablet Take 81 mg by mouth daily.  . busPIRone (BUSPAR) 5 MG tablet Take 5 mg by mouth 2 (two) times daily as needed (anxiety).   [name] Kitchen ELIQUIS 2.5 MG TABS tablet Take 2.5 mg by mouth 2 (two) times daily.  . furosemide (LASIX) 40 MG tablet Take 1 tablet (40 mg total) by mouth daily.  [name] Kitchen gabapentin (NEURONTIN) 300 MG capsule Take 300 mg by mouth at bedtime.   . Lancets (ONETOUCH DELICA PLUS [id number]) MISC USE AS DIRECTED DAILY FOR 30 DAYS  . loratadine (CLARITIN) 10 MG tablet Take 10 mg by mouth daily as needed for allergies.  . metFORMIN (GLUCOPHAGE-XR) 500 MG 24 hr tablet Take 1 tablet (500 mg total) by mouth daily with breakfast. Total dose of 1000mg  daily (Patient taking differently: Take 500 mg by mouth daily. )  . nitroGLYCERIN (NITROSTAT) 0.4 MG SL tablet Place 0.4 mg under the tongue every 5 (five) minutes x 3 doses as needed for chest pain.   Glory Rosebush VERIO test strip FOR USE WHEN CHECKING BLOOD GLUCOSE ONCE DAILY ALTERNATING AM AND PM BEFORE MEALS FINGER STICK 90 DAYS  . oxyCODONE (ROXICODONE) 5 MG immediate release tablet Take 1 tablet (5 mg total) by mouth every 4 (four) hours as needed for severe pain.  [name] Kitchen Potassium Chloride ER 20 MEQ TBCR Take 20 mEq by mouth daily.   . rosuvastatin (CRESTOR) 5 MG tablet Take 5 mg by mouth daily.  [name] Kitchen senna (SENOKOT) 8.6 MG TABS tablet Take 1 tablet (8.6 mg total) by mouth at bedtime.  [name] Kitchen spironolactone (ALDACTONE) 25 MG tablet Take 1 tablet (25 mg total) by mouth daily.  [name] Kitchen tetrahydrozoline 0.05 % ophthalmic solution Place 1 drop into both eyes 3 (three) times daily as needed (dry/irritated eyes).  . traZODone (DESYREL) 50 MG tablet Take 50 mg by mouth at bedtime as needed for sleep.   No facility-administered encounter medications on file as of 09/12/2019.    Allergy:  Allergies  Allergen Reactions  . Shellfish  Allergy Anaphylaxis    Social Hx:   Social History   Socioeconomic History  . Marital status: Widowed    Spouse name: Not on file  . Number of children: Not on file  . Years of education: Not on file  . Highest education level: Not on file  Occupational History  . Occupation: retired  Tobacco Use  . Smoking status: Former Smoker    Years: 50.00    Types: Cigarettes    Quit date: 05/29/2009    Years since quitting: 10.2  . Smokeless tobacco: Never Used  Substance and Sexual Activity  . Alcohol use: Yes    Comment: rare  . Drug use: No  . Sexual activity: Not Currently  Other Topics Concern  . Not on file  Social History Narrative   Tobacco use cigarettes: former smoker, quit in year Sept 2011, Pack-year Hx: 57, tobacco history last updated 10/10/2013.  No smoking. Per patient stopped smoking 9 months ago. No alcohol. Caffeine.: yes, 2+ servings daily, tea. No recreational drug use. Exercise: [employer] water aer[employer]. Marital status: widow.   Social Determinants of Health   Financial Resource Strain:   . Difficulty of Paying Living Expenses:   Food Insecurity:   . Worried About Charity fundraiser in the Last Year:   . Arboriculturist in the Last Year:   Transportation Needs:   . Film/video editor (Medical):   [name] Kitchen Lack of Transportation (Non-Medical):   Physical Activity:   . Days of Exercise per Week:   . Minutes of Exercise per Session:   Stress:   . Feeling of Stress :   Social Connections:   . Frequency of Communication with Friends and Family:   . Frequency of Social Gatherings with Friends and Family:   . Attends Religious Services:   . Active Member of Clubs or Organizations:   . Attends Archivist Meetings:   [name] Kitchen Marital Status:   Intimate Partner Violence:   . Fear of Current or Ex-Partner:   . Emotionally Abused:   [name] Kitchen Physically Abused:   . Sexually Abused:     Past Surgical Hx:  Past Surgical History:  Procedure Laterality Date  . ABDOMINAL  HYSTERECTOMY    . BREAST BIOPSY Right 09/04/2012  . BREAST LUMPECTOMY Right 10/08/2012  . CLIPPING OF ATRIAL APPENDAGE N/A 05/09/2018   Procedure: CLIPPING OF ATRIAL APPENDAGE USING ATRICLIP PRO2 CLIP SIZE 45MM;  Surgeon: Rexene Alberts, MD;  Location: [place];  Service: Open Heart Surgery;  Laterality: N/A;  . COLONOSCOPY    . DILATION AND CURETTAGE OF UTERUS    . LEFT HEART CATHETERIZATION WITH CORONARY ANGIOGRAM N/A 09/14/2014   Procedure: LEFT HEART CATHETERIZATION WITH CORONARY ANGIOGRAM;  Surgeon: [name], MD;  Location: [facility] CATH LAB;  Service: Cardiovascular;  Laterality: N/A;  . MAZE N/A 05/09/2018   Procedure: MAZE;  Surgeon: Rexene Alberts, MD;  Location: [place];  Service: Open Heart Surgery;  Laterality: N/A;  . MITRAL VALVE REPAIR N/A 05/09/2018   Procedure: MITRAL VALVE REPAIR (MVR) USING MEMO 4D RING SIZE 26MM;  Surgeon: Rexene Alberts, MD;  Location: [place];  Service: Open Heart Surgery;  Laterality: N/A;  . OPEN REDUCTION INTERNAL FIXATION (ORIF) PROXIMAL PHALANX Right 01/04/2016   Procedure: OPEN TREATMENT OF RIGHT THUMB PROXIMAL PHALANX FRACTURE;  Surgeon: Milly Jakob, MD;  Location: [place];  Service: Orthopedics;  Laterality: Right;  . PARTIAL MASTECTOMY WITH NEEDLE LOCALIZATION AND AXILLARY SENTINEL LYMPH NODE BX Right 10/08/2012   Procedure: RIGHT PARTIAL MASTECTOMY WITH NEEDLE LOCALIZATION AND RIGHT AXILLARY SENTINEL LYMPH NODE BX;  Surgeon: Adin Hector, MD;  Location: [place];  Service: General;  Laterality: Right;  Injection of 1% Methylene Blue dye into right breast  . PORTACATH PLACEMENT Left 10/08/2012   Procedure: INSERTION PORT-A-CATH WITH ULTRASOUND GUIDANCE;  Surgeon: Adin Hector, MD;  Location: [place];  Service: General;  Laterality: Left;  Using 110F [place]  . Posterior Lumbar Arthrodesis, Pedicle screw fixation, Posterolateral Arthodesis  03/17/11  . RIGHT/LEFT HEART CATH AND CORONARY ANGIOGRAPHY N/A 05/02/2018    Procedure: RIGHT/LEFT HEART CATH AND CORONARY ANGIOGRAPHY;  Surgeon: [name], MD;  Location: [place] CV LAB;  Service: Cardiovascular;  Laterality: N/A;  . SHOULDER ARTHROSCOPY WITH ROTATOR CUFF REPAIR AND SUBACROMIAL DECOMPRESSION  06/04/2012   Procedure: SHOULDER ARTHROSCOPY WITH ROTATOR CUFF REPAIR AND SUBACROMIAL DECOMPRESSION;  Surgeon: Francesco Runner  Tamera Punt, MD;  Location: [place];  Service: Orthopedics;  Laterality: Left;  LEFT SHOULDER ARTHROSCOPY WITH ROTATOR CUFF REPAIR AND SUBACROMIAL DECOMPRESSION AND DISTAL CLAVICLE RESECTION  . TEE WITHOUT CARDIOVERSION N/A 05/03/2018   Procedure: TRANSESOPHAGEAL ECHOCARDIOGRAM (TEE);  Surgeon: Dixie Dials, MD;  Location: [facility] ENDOSCOPY;  Service: Cardiovascular;  Laterality: N/A;  . TEE WITHOUT CARDIOVERSION N/A 05/09/2018   Procedure: TRANSESOPHAGEAL ECHOCARDIOGRAM (TEE);  Surgeon: Rexene Alberts, MD;  Location: [place];  Service: Open Heart Surgery;  Laterality: N/A;  . TONSILLECTOMY    . TRICUSPID VALVE REPLACEMENT N/A 05/09/2018   Procedure: TRICUSPID VALVE REPAIR USING MC3 RING SIZE 28MM;  Surgeon: Rexene Alberts, MD;  Location: [place];  Service: Open Heart Surgery;  Laterality: N/A;  . TUBAL LIGATION    . VULVECTOMY Left 02/06/2019   Procedure: POSTERIOR RADICAL VULVECTOMY;  Surgeon: Everitt Amber, MD;  Location: [facility];  Service: Gynecology;  Laterality: Left;  [name] Kitchen VULVECTOMY PARTIAL    . Wide Local Excision  01/2010   Bilateral groin excisions, re-excision of vulva    Past Medical Hx:  Past Medical History:  Diagnosis Date  . Anxiety   . Arthritis    back  . Atrial fibrillation ([place])   . Breast cancer ([place])    right breast cancer - 3 years ago  . Chronic diastolic heart failure (H[facility])    ECHO: 06/20/2017 - Grade 2 Diastolic Dysfunction   . Coronary artery disease   . Depression   . Diabetes mellitus without complication ([facility])    diet  . Dysrhythmia    afib  . Fibromyalgia   . Herniated disc   .  Hyperlipemia   . Hypertension   . Iron deficiency anemia   . Longstanding persistent atrial fibrillation ([place])   . Lower back pain   . Lumbar stenosis    L4-5  . Personal history of chemotherapy   . Personal history of radiation therapy   . Pulmonary hypertension ([place])   . S/P Maze operation for atrial fibrillation 05/09/2018   Complete bilateral atrial lesion set using bipolar radiofrequency and cryothermy ablation with clipping of LA appendage  . 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 S/P radiation therapy 01/23/2013-03/06/2013   Right breast / 46 Gy in 23 fractions / Right breast boost / 14 Gy in 7 fractions  . 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 Severe mitral valve regurgitation   . Spondylolysis   . 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 Status post chemotherapy 11/04/2012 - 01/06/2013.   Docetaxel/Cytoxan Q 3 Weeks x 4 cycles  . Torn rotator cuff   . Tricuspid regurgitation     Past Gynecological History:  See HPI. Hx of vulvectomy for vulvar cancer 2011. No LMP recorded. Patient has had a hysterectomy.  Family Hx:  Family History  Problem Relation Age of Onset  . Stroke Mother   . Alzheimer's disease Mother   . Heart attack Father   . Heart attack Brother   . Breast cancer Neg Hx     Review of Systems:  Constitutional  Feels well,   ENT Normal appearing ears and nares bilaterally Skin/Breast  No rash, sores, jaundice, itching, dryness Cardiovascular  No chest pain, shortness of breath, or edema  Pulmonary  No cough or wheeze.  Gastro Intestinal  No nausea, vomitting, or diarrhoea. No  bright red blood per rectum, no abdominal pain, change in bowel movement, or constipation.  Genito Urinary  No frequency, urgency, dysuria,  Musculo Skeletal  + left inguinal pain with elevation of the leg Neurologic  No weakness, numbness, change in gait,   Psychology  No depression, anxiety, insomnia.   Vitals:  Blood pressure 118/82, pulse 72, temperature 98.5 F (36.9 C), temperature source Temporal, resp. rate 17, height 5\' 7"  (1.702 m), weight 207 lb (93.9 kg), SpO2 100 %.  Physical Exam: WD in NAD Neck  Supple NROM, without any enlargements.  Lymph Node Survey No cervical supraclavicular or inguinal adenopathy Cardiovascular  Pulse normal rate, regularity and rhythm. S1 and S2 normal.  Lungs  Clear to auscultation bilateraly, without wheezes/crackles/rhonchi. Good air movement.  Skin  No rash/lesions/breakdown  Psychiatry  Alert and oriented to person, place, and time  Abdomen  Normoactive bowel sounds, abdomen soft, non-tender and obese without evidence of hernia.  Back No CVA tenderness Genito Urinary  Vulva/vagina: Normal external female genitalia.  Smooth vulva, no lesions, Acetic acid applied - no acetowhite changes. No nodularity.  Bladder/urethra:  No lesions or masses, well supported bladder  Vagina: grossly normal and free of lesions  Cervix and uterus surgically absent  Adnexa: no palpable masses. Rectal  deferred Extremities  No bilateral cyanosis, clubbing or edema.   30 minutes of direct face to face counseling time was spent with the patient. This included discussion about prognosis, therapy recommendations and postoperative side effects and are beyond the scope of routine postoperative care.   Thereasa Solo, MD  09/12/2019, 4:23 PM

## 2019-09-12 NOTE — Patient Instructions (Signed)
If you notice new itch, irritation or vaginal bleeding, please return to see Dr Denman George immediately.  Please contact Dr Serita Grit office (at [phone]) in July to request an appointment with her for October, 2021. She will then see you again in April, 2022.

## 2019-10-28 ENCOUNTER — Other Ambulatory Visit: Payer: Self-pay

## 2019-10-28 ENCOUNTER — Ambulatory Visit
Admission: RE | Admit: 2019-10-28 | Discharge: 2019-10-28 | Disposition: A | Discharge status: Home / Self Care | Payer: Medicare Other | Source: Ambulatory Visit | Attending: Geriatric Medicine | Admitting: Geriatric Medicine

## 2019-10-28 DIAGNOSIS — R921 Mammographic calcification found on diagnostic imaging of breast: Secondary | ICD-10-CM

## 2019-11-19 ENCOUNTER — Other Ambulatory Visit: Payer: Self-pay

## 2019-11-19 ENCOUNTER — Encounter: Payer: Self-pay | Admitting: Podiatry

## 2019-11-19 ENCOUNTER — Ambulatory Visit: Payer: Medicare Other | Admitting: Podiatry

## 2019-11-19 DIAGNOSIS — B351 Tinea unguium: Secondary | ICD-10-CM

## 2019-11-19 DIAGNOSIS — N183 Chronic kidney disease, stage 3 unspecified: Secondary | ICD-10-CM

## 2019-11-19 DIAGNOSIS — M79676 Pain in unspecified toe(s): Secondary | ICD-10-CM | POA: Diagnosis not present

## 2019-11-19 DIAGNOSIS — E1140 Type 2 diabetes mellitus with diabetic neuropathy, unspecified: Secondary | ICD-10-CM | POA: Diagnosis not present

## 2019-11-19 NOTE — Progress Notes (Signed)
This patient returns to my office for at risk foot care.  This patient requires this care by a professional since this patient will be at risk due to having chronic kidney disease and diabetes.    This patient is unable to cut nails herself since the patient cannot reach her  nails.These nails are painful walking and wearing shoes.  This patient presents for at risk foot care today.  General Appearance  Alert, conversant and in no acute stress.  Vascular  Dorsalis pedis and posterior tibial  pulses are palpable  bilaterally.  Capillary return is within normal limits  bilaterally. Temperature is within normal limits  bilaterally.  Neurologic  Senn-Weinstein monofilament wire test diminished   bilaterally. Muscle power within normal limits bilaterally.  Nails Thick disfigured discolored nails with subungual debris  from hallux to fifth toes bilaterally. No evidence of bacterial infection or drainage bilaterally.  Orthopedic  No limitations of motion  feet .  No crepitus or effusions noted.  No bony pathology or digital deformities noted.  HAV  B/L.    Skin  normotropic skin with no porokeratosis noted bilaterally.  No signs of infections or ulcers noted.     Onychomycosis  Pain in right toes  Pain in left toes  Consent was obtained for treatment procedures.   Mechanical debridement of nails 1-5  bilaterally performed with a nail nipper.  Filed with dremel without incident.   Patient wants to talk with [facility] for balance braces.   Return office visit   3 months                   Told patient to return for periodic foot care and evaluation due to potential at risk complications.   Gardiner Barefoot DPM

## 2020-01-23 ENCOUNTER — Other Ambulatory Visit: Payer: Self-pay | Admitting: Nephrology

## 2020-01-23 DIAGNOSIS — N184 Chronic kidney disease, stage 4 (severe): Secondary | ICD-10-CM

## 2020-02-06 ENCOUNTER — Ambulatory Visit
Admission: RE | Admit: 2020-02-06 | Discharge: 2020-02-06 | Disposition: A | Discharge status: Home / Self Care | Payer: Medicare Other | Source: Ambulatory Visit | Attending: Nephrology | Admitting: Nephrology

## 2020-02-06 DIAGNOSIS — N184 Chronic kidney disease, stage 4 (severe): Secondary | ICD-10-CM

## 2020-02-24 ENCOUNTER — Ambulatory Visit: Payer: Medicare Other | Admitting: Podiatry

## 2020-02-24 ENCOUNTER — Other Ambulatory Visit: Payer: Self-pay

## 2020-02-24 ENCOUNTER — Encounter: Payer: Self-pay | Admitting: Podiatry

## 2020-02-24 DIAGNOSIS — M79676 Pain in unspecified toe(s): Secondary | ICD-10-CM

## 2020-02-24 DIAGNOSIS — B351 Tinea unguium: Secondary | ICD-10-CM

## 2020-02-24 DIAGNOSIS — E1140 Type 2 diabetes mellitus with diabetic neuropathy, unspecified: Secondary | ICD-10-CM

## 2020-02-24 DIAGNOSIS — N183 Chronic kidney disease, stage 3 unspecified: Secondary | ICD-10-CM

## 2020-02-24 NOTE — Progress Notes (Addendum)
This patient returns to my office for at risk foot care.  This patient requires this care by a professional since this patient will be at risk due to having chronic kidney disease and diabetes.    This patient is unable to cut nails herself since the patient cannot reach her  nails.These nails are painful walking and wearing shoes.  This patient presents for at risk foot care today.  General Appearance  Alert, conversant and in no acute stress.  Vascular  Dorsalis pedis and posterior tibial  pulses are palpable  bilaterally.  Capillary return is within normal limits  bilaterally. Temperature is within normal limits  bilaterally.  Neurologic  Senn-Weinstein monofilament wire test diminished   bilaterally. Muscle power within normal limits bilaterally.  Nails Thick disfigured discolored nails with subungual debris  from hallux to fifth toes bilaterally. No evidence of bacterial infection or drainage bilaterally.  Orthopedic  No limitations of motion  feet .  No crepitus or effusions noted.  No bony pathology or digital deformities noted.  HAV  B/L.    Skin  normotropic skin with no porokeratosis noted bilaterally.  No signs of infections or ulcers noted.     Onychomycosis  Pain in right toes  Pain in left toes  Consent was obtained for treatment procedures.   Mechanical debridement of nails 1-5  bilaterally performed with a nail nipper.  Filed with dremel without incident.   Patient wants diabetic shoes for 2022.  She is to make an appointment with Liliane Channel next visit for shoes due to neuropathy and HAV  B/L.   Return office visit   3 months                   Told patient to return for periodic foot care and evaluation due to potential at risk complications.   Gardiner Barefoot DPM

## 2020-03-15 ENCOUNTER — Encounter: Payer: Self-pay | Admitting: Gynecologic Oncology

## 2020-03-15 ENCOUNTER — Other Ambulatory Visit: Payer: Self-pay

## 2020-03-15 ENCOUNTER — Inpatient Hospital Stay: Payer: Medicare Other | Attending: Gynecologic Oncology | Admitting: Gynecologic Oncology

## 2020-03-15 VITALS — BP 149/56 | HR 60 | Temp 97.3°F | Resp 20 | Wt 214.7 lb

## 2020-03-15 DIAGNOSIS — Z87891 Personal history of nicotine dependence: Secondary | ICD-10-CM | POA: Diagnosis not present

## 2020-03-15 DIAGNOSIS — E119 Type 2 diabetes mellitus without complications: Secondary | ICD-10-CM | POA: Insufficient documentation

## 2020-03-15 DIAGNOSIS — E669 Obesity, unspecified: Secondary | ICD-10-CM | POA: Diagnosis not present

## 2020-03-15 DIAGNOSIS — Z9079 Acquired absence of other genital organ(s): Secondary | ICD-10-CM | POA: Insufficient documentation

## 2020-03-15 DIAGNOSIS — Z853 Personal history of malignant neoplasm of breast: Secondary | ICD-10-CM | POA: Insufficient documentation

## 2020-03-15 DIAGNOSIS — I4891 Unspecified atrial fibrillation: Secondary | ICD-10-CM | POA: Insufficient documentation

## 2020-03-15 DIAGNOSIS — Z90710 Acquired absence of both cervix and uterus: Secondary | ICD-10-CM | POA: Insufficient documentation

## 2020-03-15 DIAGNOSIS — C519 Malignant neoplasm of vulva, unspecified: Secondary | ICD-10-CM | POA: Diagnosis present

## 2020-03-15 DIAGNOSIS — Z923 Personal history of irradiation: Secondary | ICD-10-CM | POA: Insufficient documentation

## 2020-03-15 DIAGNOSIS — Z79899 Other long term (current) drug therapy: Secondary | ICD-10-CM | POA: Insufficient documentation

## 2020-03-15 DIAGNOSIS — Z9221 Personal history of antineoplastic chemotherapy: Secondary | ICD-10-CM | POA: Insufficient documentation

## 2020-03-15 DIAGNOSIS — Z952 Presence of prosthetic heart valve: Secondary | ICD-10-CM | POA: Insufficient documentation

## 2020-03-15 DIAGNOSIS — I110 Hypertensive heart disease with heart failure: Secondary | ICD-10-CM | POA: Insufficient documentation

## 2020-03-15 DIAGNOSIS — Z7901 Long term (current) use of anticoagulants: Secondary | ICD-10-CM | POA: Diagnosis not present

## 2020-03-15 NOTE — Patient Instructions (Signed)
Please notify Dr Denman George at phone number [phone] if you notice vaginal bleeding, new pelvic or abdominal pains, bloating, feeling full easy, or a change in bladder or bowel function.   Please return to see Dr Denman George in 6 months.  Please contact Dr Serita Grit office (at [phone]) in January to request an appointment with her for April, 2022.

## 2020-03-15 NOTE — Progress Notes (Signed)
Follow-up Note: Gyn-Onc  Consult was requested by Dr. Landry Mellow for the evaluation of Diane [name] 79 y.o. female  CC:  Chief Complaint  Patient presents with  . Vulvar Cancer    Assessment/Plan:  [name]  is a 79 y.o.  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 Close (90mm) deep margin and lateral margin also close (58mm).  Recommend close surveillance with repeat exam in 6 months.  HPI: Diane [name] is a 79 year old woman who was seen in consultation at the request of Dr. 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  In a remote history the patient was seeing my partner, Dr. Nancy Marus up until 2016.  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  This was treated with a radical left posterior vulvectomy with bilateral inguinal lymphadenectomy.  Final pathology showed no metastatic disease.  She required no adjuvant therapy at that time.  She remained free of disease for 5 years and was discharged from care.  In December 2019 she underwent open heart surgery for a mitral and tricuspid valve replacement.  Following the surgery she was placed on Coumadin therapy and began experiencing intermittent vaginal spotting.  She was seen by Dr. Landry Mellow on December 25, 2018.  Dr. Landry Mellow performed a gynecologic evaluation and upon inspection of the external genitalia identified a fleshy pink exophytic 1.5 cm growth in the left introitus posteriorly.  This was biopsied on December 30, 2018 revealed high-grade vulvar intraepithelial neoplasia, VIN 3.  The biopsy margins were involved.  She reported a history of left inguinal discomfort and pain with elevation of the leg that she had noted for approximately 6 months prior to this diagnosis of recurrence.  It had been worked up and evaluated by her orthopedic surgeon but not felt to be a primary  hip issue or surgical issue.  The patient's medical history is significant for obesity, valvular heart disease (status post open heart surgery with mitral and tricuspid valve replacement in December 2019 with Dr. Roxy Manns), the patient's cardiologist is Dr. Terrence Dupont.  She also has atrial fibrillation since 2016.  She takes Coumadin and aspirin 81 mg.  She has a history of severe restrictive lung disease.  Patient has a history of right-sided breast cancer treated with Dr. Markus Jarvis and [place].  For this years required surgery, radiation, and chemotherapy.  She has aortic atherosclerosis.  She has hypertension and diabetes mellitus treated with oral agents.  Her surgical history significant for a left radical vulvectomy and bilateral inguinal lymphadenectomy in 2011.  She has had a remote history of hysterectomy for abnormal uterine bleeding but no malignancy.  Ovaries removed at the same time.  Patient has had orthopedic surgery on the thumb and rotator cuff.  She had mitral and tricuspid cuspid valve repair in 2019.  She has had cataract surgery.  Patient is an ex-smoker but quit in 2011.  She had a 50-pack-year history prior to that.  She lives alone.  On February 06, 2019 she underwent a radical posterior left vulvectomy.  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  It had a depth of invasion of 1 mm.  No lymphovascular space invasion was identified.  The peripheral margins were negative for invasive carcinoma as was the deep margin.  The deep margin abutted the  anterior rectal wall.  This margin was 4 mm from the tumor.  The closest peripheral margin was at 9:00 which represented to the midline vaginal introitus and was 6 mm from this margin.  This same 9:00 margin was involved by VIN 2-3.  Interval Hx:  She reported no new symptoms of recurrence.   Current Meds:  Outpatient Encounter Medications as of 03/15/2020  Medication Sig  .  acetaminophen (TYLENOL) 500 MG tablet Take 500 mg by mouth every 6 (six) hours as needed (for pain.).  [name] Kitchen aspirin EC 81 MG tablet Take 81 mg by mouth daily.  [name] Kitchen co-enzyme Q-10 30 MG capsule Take 30 mg by mouth daily.  [name] Kitchen ELIQUIS 2.5 MG TABS tablet Take 2.5 mg by mouth 2 (two) times daily.  . furosemide (LASIX) 40 MG tablet Take 1 tablet (40 mg total) by mouth daily.  [name] Kitchen gabapentin (NEURONTIN) 300 MG capsule Take 300 mg by mouth at bedtime.   . Lancets (ONETOUCH DELICA PLUS INOMVE72C) MISC USE AS DIRECTED DAILY FOR 30 DAYS  . loratadine (CLARITIN) 10 MG tablet Take 10 mg by mouth daily as needed for allergies.  . melatonin 5 MG TABS Take 5-10 mg by mouth at bedtime as needed.  . nitroGLYCERIN (NITROSTAT) 0.4 MG SL tablet Place 0.4 mg under the tongue every 5 (five) minutes x 3 doses as needed for chest pain.   Glory Rosebush VERIO test strip FOR USE WHEN CHECKING BLOOD GLUCOSE ONCE DAILY ALTERNATING AM AND PM BEFORE MEALS FINGER STICK 90 DAYS  . pravastatin (PRAVACHOL) 10 MG tablet Take 10 mg by mouth at bedtime.  . senna (SENOKOT) 8.6 MG TABS tablet Take 1 tablet (8.6 mg total) by mouth at bedtime.  [name] Kitchen spironolactone (ALDACTONE) 25 MG tablet Take 1 tablet (25 mg total) by mouth daily.  [name] Kitchen terconazole (TERAZOL 3) 0.8 % vaginal cream SMARTSIG:1 Application Vaginal Every Night  . amoxicillin (AMOXIL) 500 MG capsule SMARTSIG:4 Capsule(s) By Mouth  . amoxicillin-clavulanate (AUGMENTIN) 875-125 MG tablet SMARTSIG:1 Tablet(s) By Mouth Every 12 Hours  . [DISCONTINUED] busPIRone (BUSPAR) 5 MG tablet Take 5 mg by mouth 2 (two) times daily as needed (anxiety).   . [DISCONTINUED] metFORMIN (GLUCOPHAGE-XR) 500 MG 24 hr tablet Take 1 tablet (500 mg total) by mouth daily with breakfast. Total dose of 1000mg  daily (Patient taking differently: Take 500 mg by mouth daily. )  . [DISCONTINUED] oxyCODONE (ROXICODONE) 5 MG immediate release tablet Take 1 tablet (5 mg total) by mouth every 4 (four) hours as needed for severe  pain.  . [DISCONTINUED] PACERONE 200 MG tablet Take 200 mg by mouth daily.  . [DISCONTINUED] Potassium Chloride ER 20 MEQ TBCR Take 20 mEq by mouth daily.   . [DISCONTINUED] potassium chloride SA (KLOR-CON) 20 MEQ tablet Take 20 mEq by mouth every morning.  . [DISCONTINUED] rosuvastatin (CRESTOR) 5 MG tablet Take 5 mg by mouth daily.  . [DISCONTINUED] tetrahydrozoline 0.05 % ophthalmic solution Place 1 drop into both eyes 3 (three) times daily as needed (dry/irritated eyes).  . [DISCONTINUED] traZODone (DESYREL) 50 MG tablet Take 50 mg by mouth at bedtime as needed for sleep.   No facility-administered encounter medications on file as of 03/15/2020.    Allergy:  Allergies  Allergen Reactions  . Shellfish Allergy Anaphylaxis    Social Hx:   Social History   Socioeconomic History  . Marital status: Widowed    Spouse name: Not on file  . Number of children: Not on file  . Years of education: Not on  file  . Highest education level: Not on file  Occupational History  . Occupation: retired  Tobacco Use  . Smoking status: Former Smoker    Years: 50.00    Types: Cigarettes    Quit date: 05/29/2009    Years since quitting: 10.8  . Smokeless tobacco: Never Used  Vaping Use  . Vaping Use: Never used  Substance and Sexual Activity  . Alcohol use: Yes    Comment: rare  . Drug use: No  . Sexual activity: Not Currently  Other Topics Concern  . Not on file  Social History Narrative   Tobacco use cigarettes: former smoker, quit in year Sept 2011, Pack-year Hx: 8, tobacco history last updated 10/10/2013. No smoking. Per patient stopped smoking 9 months ago. No alcohol. Caffeine.: yes, 2+ servings daily, tea. No recreational drug use. Exercise: [employer] water aer[employer]. Marital status: widow.   Social Determinants of Health   Financial Resource Strain:   . Difficulty of Paying Living Expenses: Not on file  Food Insecurity:   . Worried About Charity fundraiser in the Last Year: Not on file   . Ran Out of Food in the Last Year: Not on file  Transportation Needs:   . Lack of Transportation (Medical): Not on file  . Lack of Transportation (Non-Medical): Not on file  Physical Activity:   . Days of Exercise per Week: Not on file  . Minutes of Exercise per Session: Not on file  Stress:   . Feeling of Stress : Not on file  Social Connections:   . Frequency of Communication with Friends and Family: Not on file  . Frequency of Social Gatherings with Friends and Family: Not on file  . Attends Religious Services: Not on file  . Active Member of Clubs or Organizations: Not on file  . Attends Archivist Meetings: Not on file  . Marital Status: Not on file  Intimate Partner Violence:   . Fear of Current or Ex-Partner: Not on file  . Emotionally Abused: Not on file  . Physically Abused: Not on file  . Sexually Abused: Not on file    Past Surgical Hx:  Past Surgical History:  Procedure Laterality Date  . ABDOMINAL HYSTERECTOMY    . BREAST BIOPSY Right 09/04/2012  . BREAST LUMPECTOMY Right 10/08/2012  . CLIPPING OF ATRIAL APPENDAGE N/A 05/09/2018   Procedure: CLIPPING OF ATRIAL APPENDAGE USING ATRICLIP PRO2 CLIP SIZE 45MM;  Surgeon: Rexene Alberts, MD;  Location: [place];  Service: Open Heart Surgery;  Laterality: N/A;  . COLONOSCOPY    . DILATION AND CURETTAGE OF UTERUS    . LEFT HEART CATHETERIZATION WITH CORONARY ANGIOGRAM N/A 09/14/2014   Procedure: LEFT HEART CATHETERIZATION WITH CORONARY ANGIOGRAM;  Surgeon: [name], MD;  Location: [facility] CATH LAB;  Service: Cardiovascular;  Laterality: N/A;  . MAZE N/A 05/09/2018   Procedure: MAZE;  Surgeon: Rexene Alberts, MD;  Location: [place];  Service: Open Heart Surgery;  Laterality: N/A;  . MITRAL VALVE REPAIR N/A 05/09/2018   Procedure: MITRAL VALVE REPAIR (MVR) USING MEMO 4D RING SIZE 26MM;  Surgeon: Rexene Alberts, MD;  Location: [place];  Service: Open Heart Surgery;  Laterality: N/A;  . OPEN REDUCTION INTERNAL  FIXATION (ORIF) PROXIMAL PHALANX Right 01/04/2016   Procedure: OPEN TREATMENT OF RIGHT THUMB PROXIMAL PHALANX FRACTURE;  Surgeon: Milly Jakob, MD;  Location: [place];  Service: Orthopedics;  Laterality: Right;  . PARTIAL MASTECTOMY WITH NEEDLE LOCALIZATION AND AXILLARY SENTINEL LYMPH  NODE BX Right 10/08/2012   Procedure: RIGHT PARTIAL MASTECTOMY WITH NEEDLE LOCALIZATION AND RIGHT AXILLARY SENTINEL LYMPH NODE BX;  Surgeon: Adin Hector, MD;  Location: [place];  Service: General;  Laterality: Right;  Injection of 1% Methylene Blue dye into right breast  . PORTACATH PLACEMENT Left 10/08/2012   Procedure: INSERTION PORT-A-CATH WITH ULTRASOUND GUIDANCE;  Surgeon: Adin Hector, MD;  Location: [place];  Service: General;  Laterality: Left;  Using 60F Powerport Clearvue  . Posterior Lumbar Arthrodesis, Pedicle screw fixation, Posterolateral Arthodesis  03/17/11  . RIGHT/LEFT HEART CATH AND CORONARY ANGIOGRAPHY N/A 05/02/2018   Procedure: RIGHT/LEFT HEART CATH AND CORONARY ANGIOGRAPHY;  Surgeon: [name], MD;  Location: [place] CV LAB;  Service: Cardiovascular;  Laterality: N/A;  . SHOULDER ARTHROSCOPY WITH ROTATOR CUFF REPAIR AND SUBACROMIAL DECOMPRESSION  06/04/2012   Procedure: SHOULDER ARTHROSCOPY WITH ROTATOR CUFF REPAIR AND SUBACROMIAL DECOMPRESSION;  Surgeon: Nita Sells, MD;  Location: [place];  Service: Orthopedics;  Laterality: Left;  LEFT SHOULDER ARTHROSCOPY WITH ROTATOR CUFF REPAIR AND SUBACROMIAL DECOMPRESSION AND DISTAL CLAVICLE RESECTION  . TEE WITHOUT CARDIOVERSION N/A 05/03/2018   Procedure: TRANSESOPHAGEAL ECHOCARDIOGRAM (TEE);  Surgeon: Dixie Dials, MD;  [facility];  Service: Cardiovascular;  Laterality: N/A;  . TEE WITHOUT CARDIOVERSION N/A 05/09/2018   Procedure: TRANSESOPHAGEAL ECHOCARDIOGRAM (TEE);  Surgeon: Rexene Alberts, MD;  Location: [place];  Service: Open Heart Surgery;  Laterality: N/A;  . TONSILLECTOMY    .  TRICUSPID VALVE REPLACEMENT N/A 05/09/2018   Procedure: TRICUSPID VALVE REPAIR USING MC3 RING SIZE 28MM;  Surgeon: Rexene Alberts, MD;  Location: [place];  Service: Open Heart Surgery;  Laterality: N/A;  . TUBAL LIGATION    . VULVECTOMY Left 02/06/2019   Procedure: POSTERIOR RADICAL VULVECTOMY;  Surgeon: Everitt Amber, MD;  Location: [facility];  Service: Gynecology;  Laterality: Left;  [name] Kitchen VULVECTOMY PARTIAL    . Wide Local Excision  01/2010   Bilateral groin excisions, re-excision of vulva    Past Medical Hx:  Past Medical History:  Diagnosis Date  . Anxiety   . Arthritis    back  . Atrial fibrillation ([place])   . Breast cancer ([place])    right breast cancer - 3 years ago  . Chronic diastolic heart failure (HCC)    ECHO: 06/20/2017 - Grade 2 Diastolic Dysfunction   . Coronary artery disease   . Depression   . Diabetes mellitus without complication (HCC)    diet  . Dysrhythmia    afib  . Fibromyalgia   . Herniated disc   . Hyperlipemia   . Hypertension   . Iron deficiency anemia   . Longstanding persistent atrial fibrillation ([place])   . Lower back pain   . Lumbar stenosis    L4-5  . Personal history of chemotherapy   . Personal history of radiation therapy   . Pulmonary hypertension ([place])   . S/P Maze operation for atrial fibrillation 05/09/2018   Complete bilateral atrial lesion set using bipolar radiofrequency and cryothermy ablation with clipping of LA appendage  . 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 S/P radiation therapy 01/23/2013-03/06/2013   Right breast / 46 Gy in 23 fractions / Right breast boost / 14 Gy in 7 fractions  . 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 Severe mitral valve regurgitation   . Spondylolysis   . Squamous cell carcinoma of vulva (  [place])    Stage IB -5 years ago  . Status post chemotherapy 11/04/2012 - 01/06/2013.   Docetaxel/Cytoxan Q 3 Weeks x 4 cycles  . Torn rotator cuff    . Tricuspid regurgitation     Past Gynecological History:  See HPI. Hx of vulvectomy for vulvar cancer 2011. No LMP recorded. Patient has had a hysterectomy.  Family Hx:  Family History  Problem Relation Age of Onset  . Stroke Mother   . Alzheimer's disease Mother   . Heart attack Father   . Heart attack Brother   . Breast cancer Neg Hx     Review of Systems:  Constitutional  Feels well,   ENT Normal appearing ears and nares bilaterally Skin/Breast  No rash, sores, jaundice, itching, dryness Cardiovascular  No chest pain, shortness of breath, or edema  Pulmonary  No cough or wheeze.  Gastro Intestinal  No nausea, vomitting, or diarrhoea. No bright red blood per rectum, no abdominal pain, change in bowel movement, or constipation.  Genito Urinary  No frequency, urgency, dysuria,  Musculo Skeletal  + left inguinal pain with elevation of the leg Neurologic  No weakness, numbness, change in gait,  Psychology  No depression, anxiety, insomnia.   Vitals:  Blood pressure (!) 149/56, pulse 60, temperature (!) 97.3 F (36.3 C), temperature source Tympanic, resp. rate 20, weight 214 lb 11.2 oz (97.4 kg), SpO2 100 %.  Physical Exam: WD in NAD Neck  Supple NROM, without any enlargements.  Lymph Node Survey No cervical supraclavicular or inguinal adenopathy Cardiovascular  Pulse normal rate, regularity and rhythm. S1 and S2 normal.  Lungs  Clear to auscultation bilateraly, without wheezes/crackles/rhonchi. Good air movement.  Skin  No rash/lesions/breakdown  Psychiatry  Alert and oriented to person, place, and time  Abdomen  Normoactive bowel sounds, abdomen soft, non-tender and obese without evidence of hernia.  Back No CVA tenderness Genito Urinary  Vulva/vagina: Normal external female genitalia.  Smooth vulva, no lesions, Acetic acid applied - no acetowhite changes. No nodularity.  Bladder/urethra:  No lesions or masses, well supported bladder  Vagina: grossly  normal and free of lesions  Cervix and uterus surgically absent  Adnexa: no palpable masses. Rectal  Smooth rectum, smooth rectovaginal septum.  Extremities  No bilateral cyanosis, clubbing or edema.   Thereasa Solo, MD  03/15/2020, 1:56 PM

## 2020-03-31 ENCOUNTER — Other Ambulatory Visit: Payer: Self-pay | Admitting: Nephrology

## 2020-04-01 ENCOUNTER — Other Ambulatory Visit: Payer: Self-pay | Admitting: Nephrology

## 2020-04-01 DIAGNOSIS — N281 Cyst of kidney, acquired: Secondary | ICD-10-CM

## 2020-04-20 ENCOUNTER — Ambulatory Visit
Admission: RE | Admit: 2020-04-20 | Discharge: 2020-04-20 | Disposition: A | Discharge status: Home / Self Care | Payer: Medicare Other | Source: Ambulatory Visit | Attending: Nephrology | Admitting: Nephrology

## 2020-04-20 ENCOUNTER — Other Ambulatory Visit: Payer: Self-pay

## 2020-04-20 DIAGNOSIS — N281 Cyst of kidney, acquired: Secondary | ICD-10-CM

## 2020-04-20 MED ORDER — GADOBENATE DIMEGLUMINE 529 MG/ML IV SOLN
20.0000 mL | Freq: Once | INTRAVENOUS | Status: AC | PRN
  Administered 2020-04-20: 20 mL via INTRAVENOUS

## 2020-05-25 ENCOUNTER — Other Ambulatory Visit: Payer: Self-pay

## 2020-05-25 ENCOUNTER — Ambulatory Visit: Payer: Medicare Other | Admitting: Podiatry

## 2020-05-25 ENCOUNTER — Encounter: Payer: Self-pay | Admitting: Podiatry

## 2020-05-25 ENCOUNTER — Other Ambulatory Visit: Payer: Medicare Other | Admitting: Orthotics

## 2020-05-25 DIAGNOSIS — N183 Chronic kidney disease, stage 3 unspecified: Secondary | ICD-10-CM

## 2020-05-25 DIAGNOSIS — M79676 Pain in unspecified toe(s): Secondary | ICD-10-CM | POA: Diagnosis not present

## 2020-05-25 DIAGNOSIS — B351 Tinea unguium: Secondary | ICD-10-CM

## 2020-05-25 DIAGNOSIS — E1140 Type 2 diabetes mellitus with diabetic neuropathy, unspecified: Secondary | ICD-10-CM

## 2020-05-25 NOTE — Progress Notes (Signed)
This patient returns to my office for at risk foot care.  This patient requires this care by a professional since this patient will be at risk due to having chronic kidney disease and diabetes.    This patient is unable to cut nails herself since the patient cannot reach her  nails.These nails are painful walking and wearing shoes.  This patient presents for at risk foot care today.  General Appearance  Alert, conversant and in no acute stress.  Vascular  Dorsalis pedis and posterior tibial  pulses are palpable  bilaterally.  Capillary return is within normal limits  bilaterally. Temperature is within normal limits  bilaterally.  Neurologic  Senn-Weinstein monofilament wire test diminished   bilaterally. Muscle power within normal limits bilaterally.  Nails Thick disfigured discolored nails with subungual debris  from hallux to fifth toes bilaterally. No evidence of bacterial infection or drainage bilaterally.  Orthopedic  No limitations of motion  feet .  No crepitus or effusions noted.  No bony pathology or digital deformities noted.  HAV  B/L.    Skin  normotropic skin with no porokeratosis noted bilaterally.  No signs of infections or ulcers noted.     Onychomycosis  Pain in right toes  Pain in left toes  Consent was obtained for treatment procedures.   Mechanical debridement of nails 1-5  bilaterally performed with a nail nipper.  Filed with dremel without incident.   Patient wants diabetic shoes for 2022.  She is to make an appointment with Liliane Channel next visit for shoes due to neuropathy and HAV  B/L.   Return office visit   3 months                   Told patient to return for periodic foot care and evaluation due to potential at risk complications.   Gardiner Barefoot DPM

## 2020-06-01 DIAGNOSIS — I5022 Chronic systolic (congestive) heart failure: Secondary | ICD-10-CM | POA: Diagnosis not present

## 2020-06-01 DIAGNOSIS — I129 Hypertensive chronic kidney disease with stage 1 through stage 4 chronic kidney disease, or unspecified chronic kidney disease: Secondary | ICD-10-CM | POA: Diagnosis not present

## 2020-06-01 DIAGNOSIS — I700 Atherosclerosis of aorta: Secondary | ICD-10-CM | POA: Diagnosis not present

## 2020-06-01 DIAGNOSIS — E1121 Type 2 diabetes mellitus with diabetic nephropathy: Secondary | ICD-10-CM | POA: Diagnosis not present

## 2020-06-01 DIAGNOSIS — D6869 Other thrombophilia: Secondary | ICD-10-CM | POA: Diagnosis not present

## 2020-06-01 DIAGNOSIS — N184 Chronic kidney disease, stage 4 (severe): Secondary | ICD-10-CM | POA: Diagnosis not present

## 2020-06-01 DIAGNOSIS — Z1389 Encounter for screening for other disorder: Secondary | ICD-10-CM | POA: Diagnosis not present

## 2020-06-01 DIAGNOSIS — E1169 Type 2 diabetes mellitus with other specified complication: Secondary | ICD-10-CM | POA: Diagnosis not present

## 2020-06-01 DIAGNOSIS — Z0000 Encounter for general adult medical examination without abnormal findings: Secondary | ICD-10-CM | POA: Diagnosis not present

## 2020-06-01 DIAGNOSIS — E7800 Pure hypercholesterolemia, unspecified: Secondary | ICD-10-CM | POA: Diagnosis not present

## 2020-06-01 DIAGNOSIS — J984 Other disorders of lung: Secondary | ICD-10-CM | POA: Diagnosis not present

## 2020-06-01 DIAGNOSIS — I4819 Other persistent atrial fibrillation: Secondary | ICD-10-CM | POA: Diagnosis not present

## 2020-06-04 ENCOUNTER — Other Ambulatory Visit: Payer: Self-pay

## 2020-06-04 ENCOUNTER — Ambulatory Visit (INDEPENDENT_AMBULATORY_CARE_PROVIDER_SITE_OTHER): Payer: Medicare Other | Admitting: Orthotics

## 2020-06-04 DIAGNOSIS — E1140 Type 2 diabetes mellitus with diabetic neuropathy, unspecified: Secondary | ICD-10-CM

## 2020-06-10 DIAGNOSIS — I129 Hypertensive chronic kidney disease with stage 1 through stage 4 chronic kidney disease, or unspecified chronic kidney disease: Secondary | ICD-10-CM | POA: Diagnosis not present

## 2020-06-10 DIAGNOSIS — N184 Chronic kidney disease, stage 4 (severe): Secondary | ICD-10-CM | POA: Diagnosis not present

## 2020-06-10 DIAGNOSIS — E7800 Pure hypercholesterolemia, unspecified: Secondary | ICD-10-CM | POA: Diagnosis not present

## 2020-06-10 DIAGNOSIS — E1121 Type 2 diabetes mellitus with diabetic nephropathy: Secondary | ICD-10-CM | POA: Diagnosis not present

## 2020-06-10 DIAGNOSIS — E1169 Type 2 diabetes mellitus with other specified complication: Secondary | ICD-10-CM | POA: Diagnosis not present

## 2020-06-10 DIAGNOSIS — I5022 Chronic systolic (congestive) heart failure: Secondary | ICD-10-CM | POA: Diagnosis not present

## 2020-07-05 ENCOUNTER — Other Ambulatory Visit: Payer: Self-pay

## 2020-07-05 ENCOUNTER — Ambulatory Visit (INDEPENDENT_AMBULATORY_CARE_PROVIDER_SITE_OTHER): Payer: Medicare Other | Admitting: Orthotics

## 2020-07-05 DIAGNOSIS — M2011 Hallux valgus (acquired), right foot: Secondary | ICD-10-CM | POA: Diagnosis not present

## 2020-07-05 DIAGNOSIS — M2012 Hallux valgus (acquired), left foot: Secondary | ICD-10-CM

## 2020-07-05 DIAGNOSIS — M6788 Other specified disorders of synovium and tendon, other site: Secondary | ICD-10-CM

## 2020-07-05 DIAGNOSIS — E1140 Type 2 diabetes mellitus with diabetic neuropathy, unspecified: Secondary | ICD-10-CM

## 2020-07-05 NOTE — Progress Notes (Signed)

## 2020-07-09 DIAGNOSIS — M48062 Spinal stenosis, lumbar region with neurogenic claudication: Secondary | ICD-10-CM | POA: Diagnosis not present

## 2020-07-15 DIAGNOSIS — I129 Hypertensive chronic kidney disease with stage 1 through stage 4 chronic kidney disease, or unspecified chronic kidney disease: Secondary | ICD-10-CM | POA: Diagnosis not present

## 2020-07-15 DIAGNOSIS — E7800 Pure hypercholesterolemia, unspecified: Secondary | ICD-10-CM | POA: Diagnosis not present

## 2020-07-15 DIAGNOSIS — Z853 Personal history of malignant neoplasm of breast: Secondary | ICD-10-CM | POA: Diagnosis not present

## 2020-07-15 DIAGNOSIS — E1169 Type 2 diabetes mellitus with other specified complication: Secondary | ICD-10-CM | POA: Diagnosis not present

## 2020-07-15 DIAGNOSIS — N184 Chronic kidney disease, stage 4 (severe): Secondary | ICD-10-CM | POA: Diagnosis not present

## 2020-07-15 DIAGNOSIS — E1121 Type 2 diabetes mellitus with diabetic nephropathy: Secondary | ICD-10-CM | POA: Diagnosis not present

## 2020-07-15 DIAGNOSIS — I5022 Chronic systolic (congestive) heart failure: Secondary | ICD-10-CM | POA: Diagnosis not present

## 2020-07-19 ENCOUNTER — Other Ambulatory Visit: Payer: Self-pay | Admitting: Nurse Practitioner

## 2020-07-19 DIAGNOSIS — N6310 Unspecified lump in the right breast, unspecified quadrant: Secondary | ICD-10-CM

## 2020-07-19 DIAGNOSIS — Z853 Personal history of malignant neoplasm of breast: Secondary | ICD-10-CM | POA: Diagnosis not present

## 2020-07-20 ENCOUNTER — Ambulatory Visit
Admission: RE | Admit: 2020-07-20 | Discharge: 2020-07-20 | Disposition: A | Discharge status: Home / Self Care | Payer: Medicare Other | Source: Ambulatory Visit | Attending: Nurse Practitioner | Admitting: Nurse Practitioner

## 2020-07-20 ENCOUNTER — Other Ambulatory Visit: Payer: Self-pay

## 2020-07-20 DIAGNOSIS — N6310 Unspecified lump in the right breast, unspecified quadrant: Secondary | ICD-10-CM

## 2020-07-20 DIAGNOSIS — R921 Mammographic calcification found on diagnostic imaging of breast: Secondary | ICD-10-CM | POA: Diagnosis not present

## 2020-07-20 DIAGNOSIS — N644 Mastodynia: Secondary | ICD-10-CM | POA: Diagnosis not present

## 2020-07-28 DIAGNOSIS — I4891 Unspecified atrial fibrillation: Secondary | ICD-10-CM | POA: Diagnosis not present

## 2020-07-28 DIAGNOSIS — I10 Essential (primary) hypertension: Secondary | ICD-10-CM | POA: Diagnosis not present

## 2020-07-28 DIAGNOSIS — E119 Type 2 diabetes mellitus without complications: Secondary | ICD-10-CM | POA: Diagnosis not present

## 2020-07-28 DIAGNOSIS — E785 Hyperlipidemia, unspecified: Secondary | ICD-10-CM | POA: Diagnosis not present

## 2020-07-28 DIAGNOSIS — R0789 Other chest pain: Secondary | ICD-10-CM | POA: Diagnosis not present

## 2020-07-28 DIAGNOSIS — I340 Nonrheumatic mitral (valve) insufficiency: Secondary | ICD-10-CM | POA: Diagnosis not present

## 2020-07-29 DIAGNOSIS — M48062 Spinal stenosis, lumbar region with neurogenic claudication: Secondary | ICD-10-CM | POA: Diagnosis not present

## 2020-07-30 DIAGNOSIS — I129 Hypertensive chronic kidney disease with stage 1 through stage 4 chronic kidney disease, or unspecified chronic kidney disease: Secondary | ICD-10-CM | POA: Diagnosis not present

## 2020-07-30 DIAGNOSIS — I5022 Chronic systolic (congestive) heart failure: Secondary | ICD-10-CM | POA: Diagnosis not present

## 2020-07-30 DIAGNOSIS — Z853 Personal history of malignant neoplasm of breast: Secondary | ICD-10-CM | POA: Diagnosis not present

## 2020-07-30 DIAGNOSIS — E1169 Type 2 diabetes mellitus with other specified complication: Secondary | ICD-10-CM | POA: Diagnosis not present

## 2020-07-30 DIAGNOSIS — E1121 Type 2 diabetes mellitus with diabetic nephropathy: Secondary | ICD-10-CM | POA: Diagnosis not present

## 2020-07-30 DIAGNOSIS — N184 Chronic kidney disease, stage 4 (severe): Secondary | ICD-10-CM | POA: Diagnosis not present

## 2020-07-30 DIAGNOSIS — E7800 Pure hypercholesterolemia, unspecified: Secondary | ICD-10-CM | POA: Diagnosis not present

## 2020-08-09 ENCOUNTER — Telehealth: Payer: Self-pay | Admitting: *Deleted

## 2020-08-09 NOTE — Telephone Encounter (Signed)
Patient called and scheduled a follow up appt for April 26th at 2 pm

## 2020-08-24 ENCOUNTER — Ambulatory Visit (INDEPENDENT_AMBULATORY_CARE_PROVIDER_SITE_OTHER): Payer: Medicare Other | Admitting: Podiatry

## 2020-08-24 ENCOUNTER — Other Ambulatory Visit: Payer: Self-pay

## 2020-08-24 ENCOUNTER — Encounter: Payer: Self-pay | Admitting: Podiatry

## 2020-08-24 DIAGNOSIS — B351 Tinea unguium: Secondary | ICD-10-CM | POA: Diagnosis not present

## 2020-08-24 DIAGNOSIS — M79676 Pain in unspecified toe(s): Secondary | ICD-10-CM

## 2020-08-24 DIAGNOSIS — N183 Chronic kidney disease, stage 3 unspecified: Secondary | ICD-10-CM

## 2020-08-24 DIAGNOSIS — E1140 Type 2 diabetes mellitus with diabetic neuropathy, unspecified: Secondary | ICD-10-CM | POA: Diagnosis not present

## 2020-08-24 NOTE — Progress Notes (Signed)
This patient returns to my office for at risk foot care.  This patient requires this care by a professional since this patient will be at risk due to having chronic kidney disease and diabetes.    This patient is unable to cut nails herself since the patient cannot reach her  nails.These nails are painful walking and wearing shoes.  This patient presents for at risk foot care today.  General Appearance  Alert, conversant and in no acute stress.  Vascular  Dorsalis pedis and posterior tibial  pulses are palpable  bilaterally.  Capillary return is within normal limits  bilaterally. Temperature is within normal limits  bilaterally.  Neurologic  Senn-Weinstein monofilament wire test diminished   bilaterally. Muscle power within normal limits bilaterally.  Nails Thick disfigured discolored nails with subungual debris  from hallux to fifth toes bilaterally. No evidence of bacterial infection or drainage bilaterally.  Orthopedic  No limitations of motion  feet .  No crepitus or effusions noted.  No bony pathology or digital deformities noted.  HAV  B/L.    Skin  normotropic skin with no porokeratosis noted bilaterally.  No signs of infections or ulcers noted.     Onychomycosis  Pain in right toes  Pain in left toes  Consent was obtained for treatment procedures.   Mechanical debridement of nails 1-5  bilaterally performed with a nail nipper.  Filed with dremel without incident.   Patient wants diabetic shoes for 2022.  She is to make an appointment with Liliane Channel next visit for shoes due to neuropathy and HAV  B/L.   Return office visit   3 months                   Told patient to return for periodic foot care and evaluation due to potential at risk complications.   Gardiner Barefoot DPM

## 2020-08-27 DIAGNOSIS — I129 Hypertensive chronic kidney disease with stage 1 through stage 4 chronic kidney disease, or unspecified chronic kidney disease: Secondary | ICD-10-CM | POA: Diagnosis not present

## 2020-08-27 DIAGNOSIS — N184 Chronic kidney disease, stage 4 (severe): Secondary | ICD-10-CM | POA: Diagnosis not present

## 2020-08-27 DIAGNOSIS — E1169 Type 2 diabetes mellitus with other specified complication: Secondary | ICD-10-CM | POA: Diagnosis not present

## 2020-08-27 DIAGNOSIS — E7800 Pure hypercholesterolemia, unspecified: Secondary | ICD-10-CM | POA: Diagnosis not present

## 2020-08-27 DIAGNOSIS — E1121 Type 2 diabetes mellitus with diabetic nephropathy: Secondary | ICD-10-CM | POA: Diagnosis not present

## 2020-08-27 DIAGNOSIS — I5022 Chronic systolic (congestive) heart failure: Secondary | ICD-10-CM | POA: Diagnosis not present

## 2020-08-30 IMAGING — MG DIGITAL DIAGNOSTIC BILAT W/ TOMO W/ CAD
8 of 11 series · 8 of 27 positions shown · non-contrast
Comparison: Previous exam(s).

CLINICAL DATA: 78-year-old female for 1 year follow-up of RIGHT
breast calcifications and for annual bilateral mammogram. History of
RIGHT breast cancer and lumpectomy in 9218.

EXAM:
DIGITAL DIAGNOSTIC BILATERAL MAMMOGRAM WITH CAD AND TOMO

[R CC (1 of 2)]
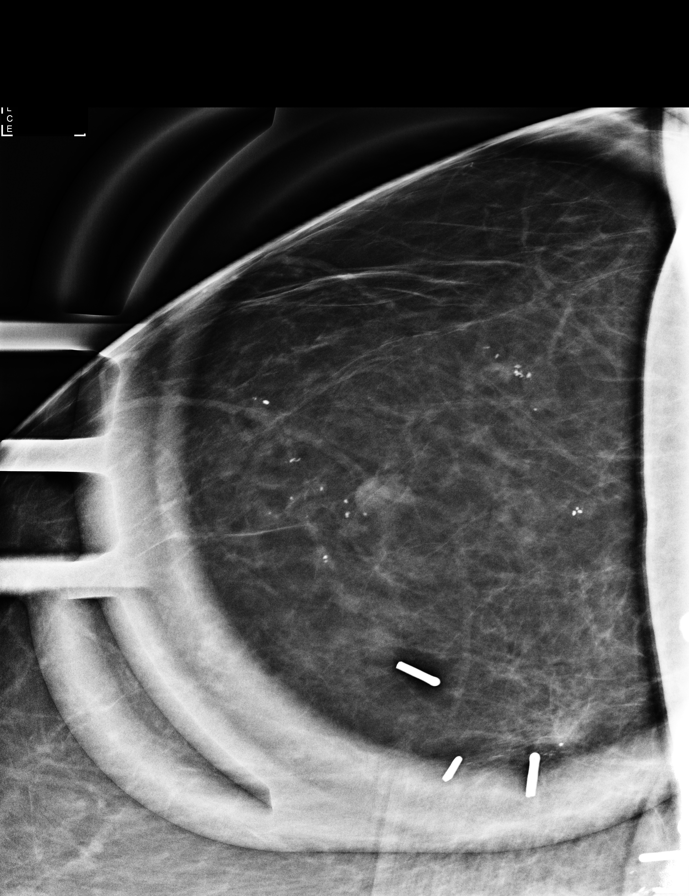

[R ML]
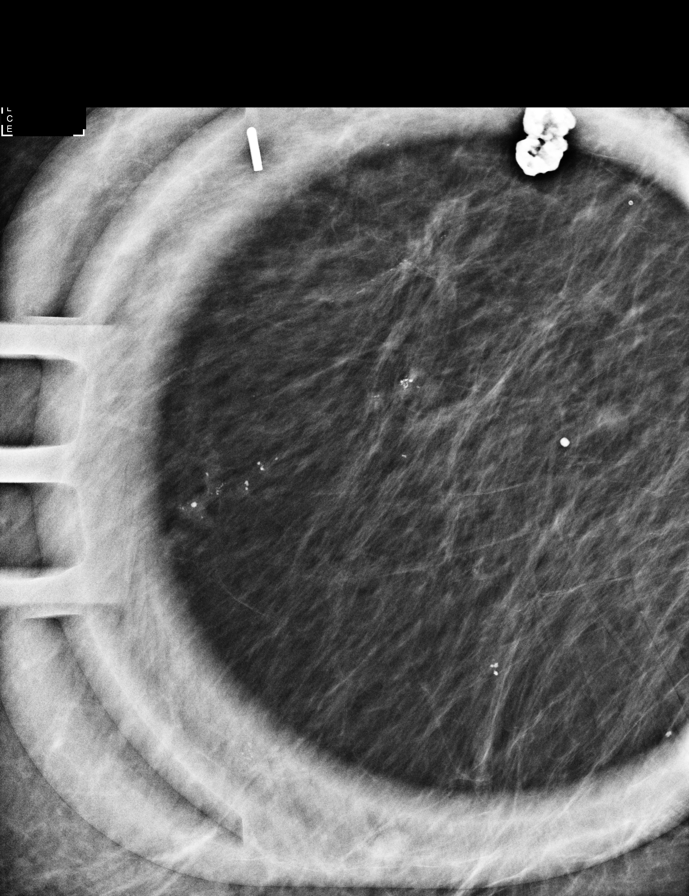

[R CC (2 of 2)]
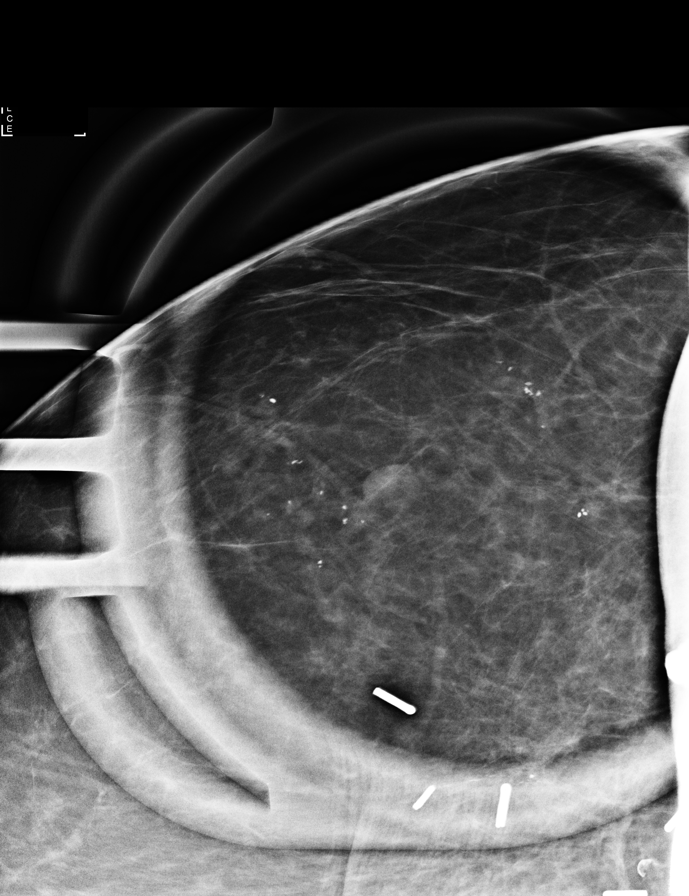

[L CC synth-2D]
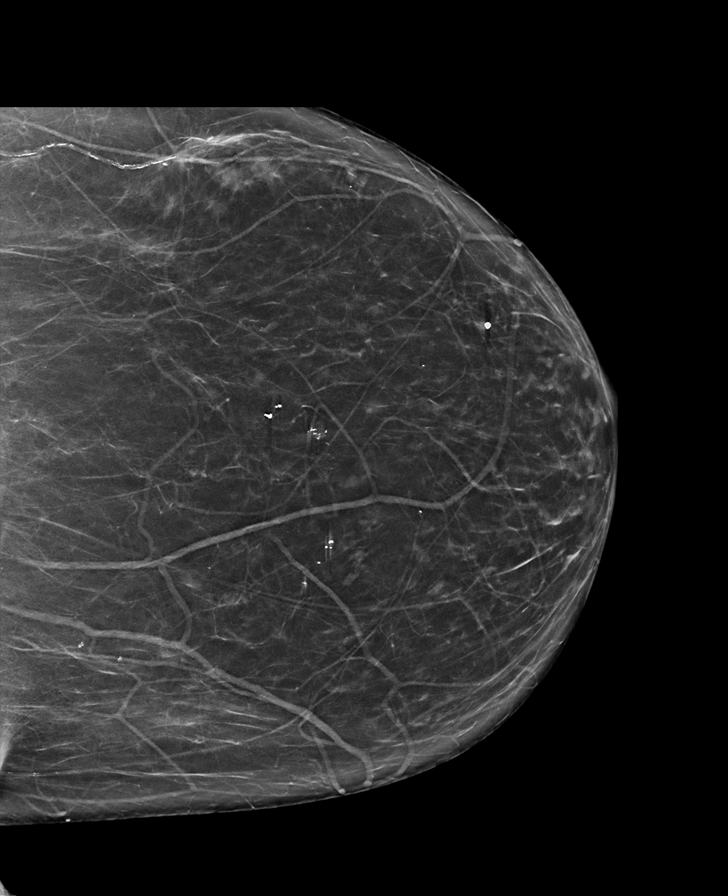

[R MLO synth-2D]
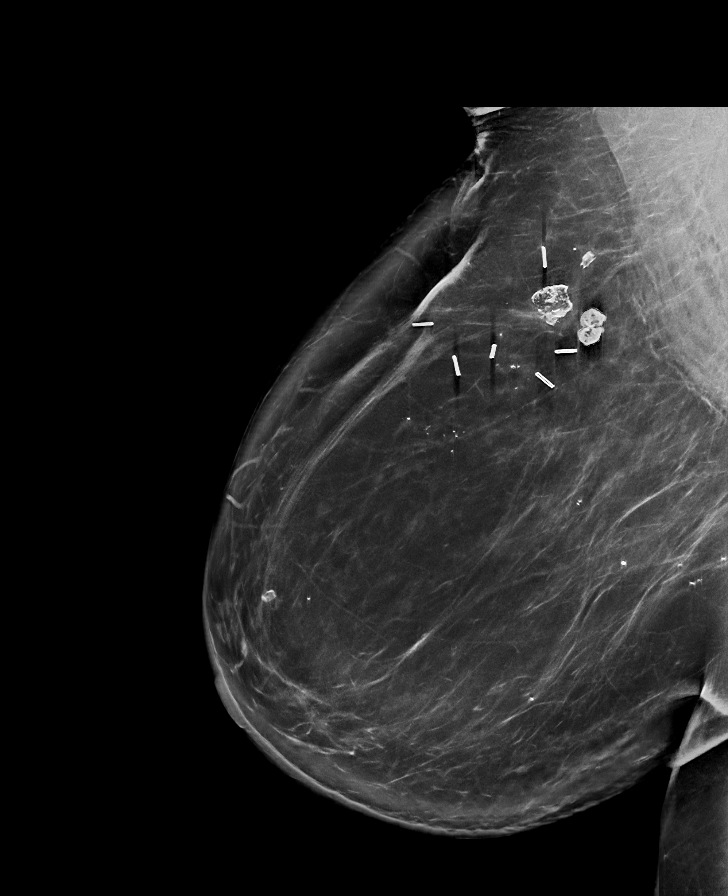

[L MLO synth-2D]
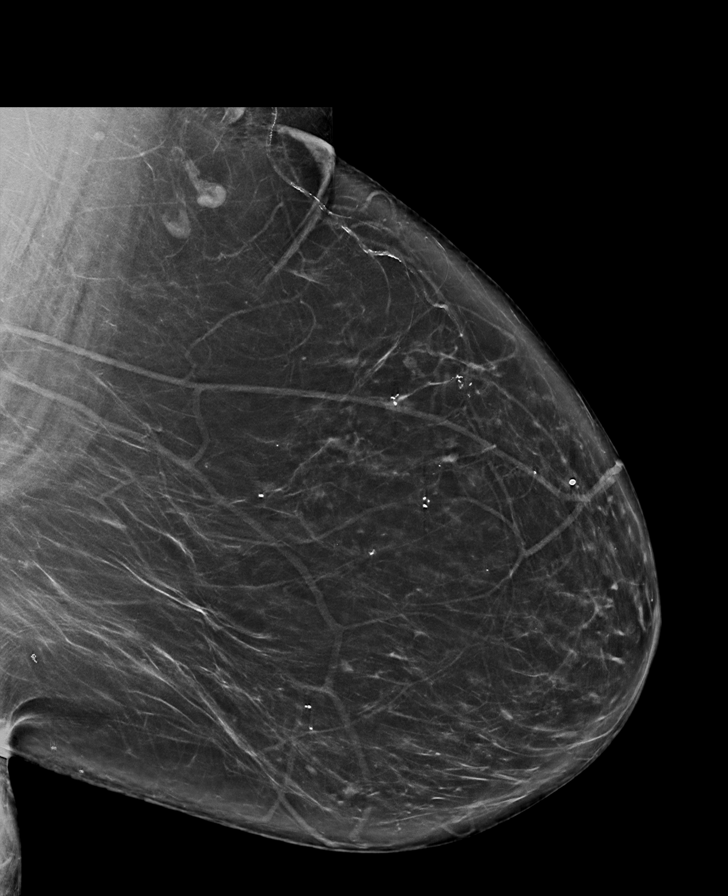

[R CC synth-2D]
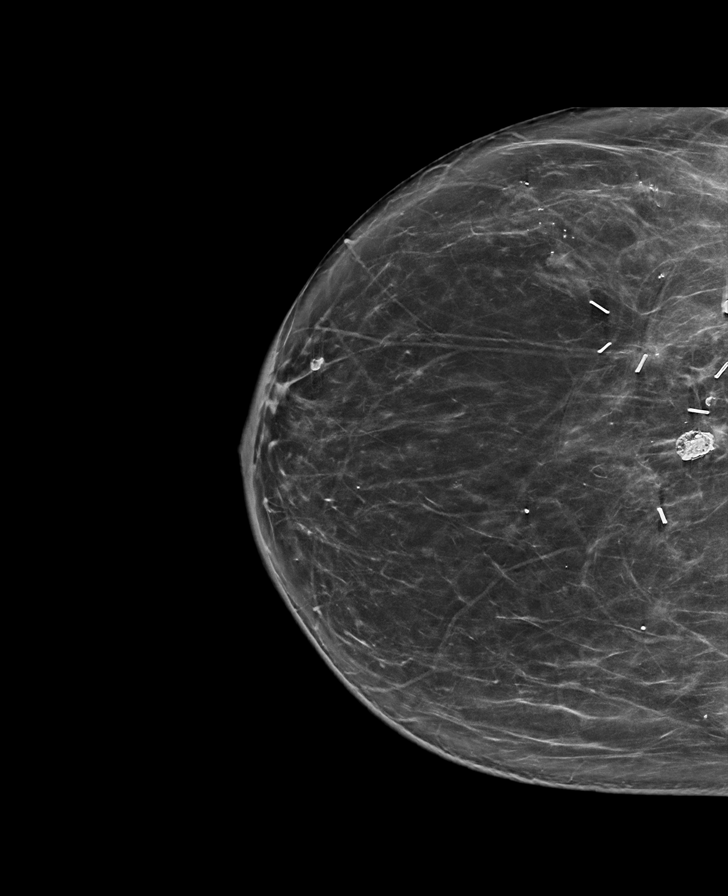

[L CC tomo · tomo slice 35/69.0]
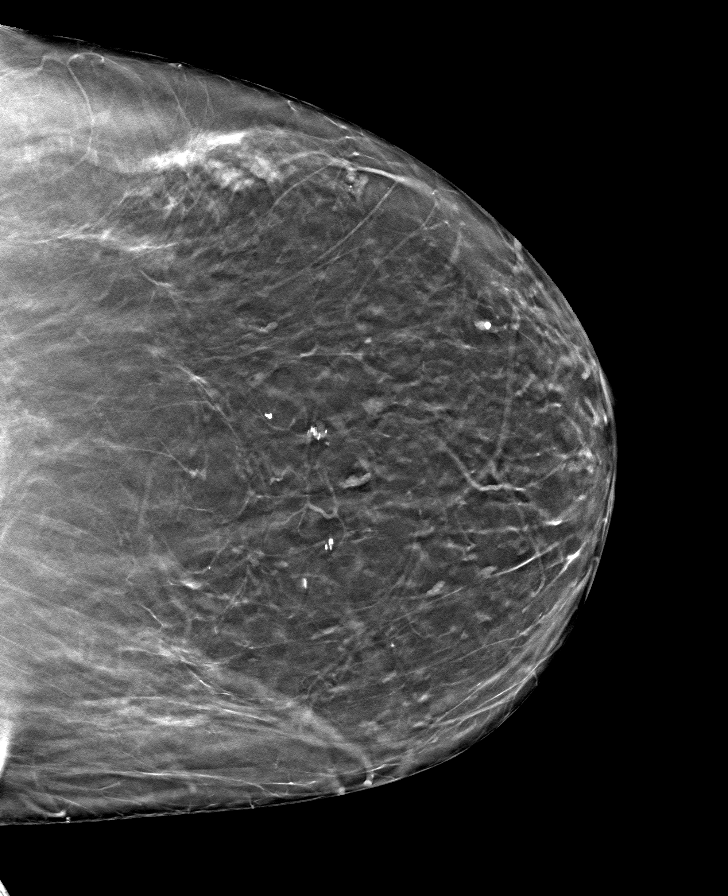

[8 of 27 positions shown; findings below may reference images not displayed]

ACR Breast Density Category b: There are scattered areas of
fibroglandular density.
FINDINGS: 2D/3D full field views of both breasts and magnification views of
the RIGHT breast demonstrate unchanged loosely grouped round/coarse
calcifications within the UPPER-OUTER RIGHT breast.

RIGHT lumpectomy changes are again noted.

No new calcifications, suspicious mass or nonsurgical distortion
identified within either breast.

Mammographic images were processed with CAD.
IMPRESSION: 1. Stable likely benign RIGHT breast calcifications. One year
follow-up recommended to ensure 2 year stability.
2. No suspicious mammographic findings.
3. RIGHT lumpectomy changes.

RECOMMENDATION:
Bilateral diagnostic mammogram with magnification views of the RIGHT
breast in 1 year.

I have discussed the findings and recommendations with the patient.
If applicable, a reminder letter will be sent to the patient
regarding the next appointment.

BI-RADS CATEGORY  3: Probably benign.

## 2020-09-01 DIAGNOSIS — E1122 Type 2 diabetes mellitus with diabetic chronic kidney disease: Secondary | ICD-10-CM | POA: Diagnosis not present

## 2020-09-01 DIAGNOSIS — E785 Hyperlipidemia, unspecified: Secondary | ICD-10-CM | POA: Diagnosis not present

## 2020-09-01 DIAGNOSIS — R7309 Other abnormal glucose: Secondary | ICD-10-CM | POA: Diagnosis not present

## 2020-09-01 DIAGNOSIS — N281 Cyst of kidney, acquired: Secondary | ICD-10-CM | POA: Diagnosis not present

## 2020-09-01 DIAGNOSIS — I129 Hypertensive chronic kidney disease with stage 1 through stage 4 chronic kidney disease, or unspecified chronic kidney disease: Secondary | ICD-10-CM | POA: Diagnosis not present

## 2020-09-01 DIAGNOSIS — N2581 Secondary hyperparathyroidism of renal origin: Secondary | ICD-10-CM | POA: Diagnosis not present

## 2020-09-01 DIAGNOSIS — N184 Chronic kidney disease, stage 4 (severe): Secondary | ICD-10-CM | POA: Diagnosis not present

## 2020-09-01 DIAGNOSIS — R809 Proteinuria, unspecified: Secondary | ICD-10-CM | POA: Diagnosis not present

## 2020-09-01 DIAGNOSIS — D631 Anemia in chronic kidney disease: Secondary | ICD-10-CM | POA: Diagnosis not present

## 2020-09-15 ENCOUNTER — Encounter: Payer: Self-pay | Admitting: Gynecologic Oncology

## 2020-09-16 ENCOUNTER — Other Ambulatory Visit: Payer: Self-pay | Admitting: Geriatric Medicine

## 2020-09-16 DIAGNOSIS — Z9889 Other specified postprocedural states: Secondary | ICD-10-CM

## 2020-09-21 ENCOUNTER — Inpatient Hospital Stay: Payer: Medicare Other | Attending: Gynecologic Oncology | Admitting: Gynecologic Oncology

## 2020-09-21 ENCOUNTER — Encounter: Payer: Self-pay | Admitting: Gynecologic Oncology

## 2020-09-21 ENCOUNTER — Other Ambulatory Visit: Payer: Self-pay

## 2020-09-21 VITALS — BP 137/50 | HR 65 | Temp 97.0°F | Resp 16 | Ht 67.0 in | Wt 214.6 lb

## 2020-09-21 DIAGNOSIS — I4811 Longstanding persistent atrial fibrillation: Secondary | ICD-10-CM | POA: Diagnosis not present

## 2020-09-21 DIAGNOSIS — Z87891 Personal history of nicotine dependence: Secondary | ICD-10-CM | POA: Insufficient documentation

## 2020-09-21 DIAGNOSIS — I700 Atherosclerosis of aorta: Secondary | ICD-10-CM | POA: Insufficient documentation

## 2020-09-21 DIAGNOSIS — Z79899 Other long term (current) drug therapy: Secondary | ICD-10-CM | POA: Diagnosis not present

## 2020-09-21 DIAGNOSIS — Z7901 Long term (current) use of anticoagulants: Secondary | ICD-10-CM | POA: Insufficient documentation

## 2020-09-21 DIAGNOSIS — E119 Type 2 diabetes mellitus without complications: Secondary | ICD-10-CM | POA: Diagnosis not present

## 2020-09-21 DIAGNOSIS — C519 Malignant neoplasm of vulva, unspecified: Secondary | ICD-10-CM | POA: Diagnosis present

## 2020-09-21 DIAGNOSIS — I110 Hypertensive heart disease with heart failure: Secondary | ICD-10-CM | POA: Diagnosis not present

## 2020-09-21 DIAGNOSIS — Z952 Presence of prosthetic heart valve: Secondary | ICD-10-CM | POA: Insufficient documentation

## 2020-09-21 DIAGNOSIS — J449 Chronic obstructive pulmonary disease, unspecified: Secondary | ICD-10-CM | POA: Diagnosis not present

## 2020-09-21 DIAGNOSIS — I272 Pulmonary hypertension, unspecified: Secondary | ICD-10-CM | POA: Insufficient documentation

## 2020-09-21 NOTE — Patient Instructions (Signed)
Dr Denman George recommends treating the vulvar boils with a wet warm compress.  It is also helpful to massage these in the shower with warm water to help them express.  Please contact Dr Serita Grit office (at [phone]) in July to request an appointment with her for October.

## 2020-09-21 NOTE — Progress Notes (Signed)
Follow-up Note: Gyn-Onc  Consult was requested by Dr. Landry Mellow for the evaluation of Diane [name] 80 y.o. female  CC:  Chief Complaint  Patient presents with  . Vulva cancer [facility])    Assessment/Plan:  [name]  is a 80 y.o.  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 Close (44mm) deep margin and lateral margin also close (27mm).  Recommend close surveillance with repeat exam in 6 months.  Advised regarding warm compresses and massage for the caruncles on the labia.   HPI: Diane [name] is a 80 year old woman who was seen in consultation at the request of Dr. 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  In a remote history the patient was seeing my partner, Dr. Nancy Marus up until 2016.  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  This was treated with a radical left posterior vulvectomy with bilateral inguinal lymphadenectomy.  Final pathology showed no metastatic disease.  She required no adjuvant therapy at that time.  She remained free of disease for 5 years and was discharged from care.  In December 2019 she underwent open heart surgery for a mitral and tricuspid valve replacement.  Following the surgery she was placed on Coumadin therapy and began experiencing intermittent vaginal spotting.  She was seen by Dr. Landry Mellow on December 25, 2018.  Dr. Landry Mellow performed a gynecologic evaluation and upon inspection of the external genitalia identified a fleshy pink exophytic 1.5 cm growth in the left introitus posteriorly.  This was biopsied on December 30, 2018 revealed high-grade vulvar intraepithelial neoplasia, VIN 3.  The biopsy margins were involved.  She reported a history of left inguinal discomfort and pain with elevation of the leg that she had noted for approximately 6 months prior to this diagnosis of recurrence.  It  had been worked up and evaluated by her orthopedic surgeon but not felt to be a primary hip issue or surgical issue.  The patient's medical history is significant for obesity, valvular heart disease (status post open heart surgery with mitral and tricuspid valve replacement in December 2019 with Dr. Roxy Manns), the patient's cardiologist is Dr. Terrence Dupont.  She also has atrial fibrillation since 2016.  She takes Coumadin and aspirin 81 mg.  She has a history of severe restrictive lung disease.  Patient has a history of right-sided breast cancer treated with Dr. Markus Jarvis and [place].  For this years required surgery, radiation, and chemotherapy.  She has aortic atherosclerosis.  She has hypertension and diabetes mellitus treated with oral agents.  Her surgical history significant for a left radical vulvectomy and bilateral inguinal lymphadenectomy in 2011.  She has had a remote history of hysterectomy for abnormal uterine bleeding but no malignancy.  Ovaries removed at the same time.  Patient has had orthopedic surgery on the thumb and rotator cuff.  She had mitral and tricuspid cuspid valve repair in 2019.  She has had cataract surgery.  Patient is an ex-smoker but quit in 2011.  She had a 50-pack-year history prior to that.  She lives alone.  On February 06, 2019 she underwent a radical posterior left vulvectomy.  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  It had a depth of invasion of 1 mm.  No lymphovascular space invasion was identified.  The peripheral margins were  negative for invasive carcinoma as was the deep margin.  The deep margin abutted the anterior rectal wall.  This margin was 4 mm from the tumor.  The closest peripheral margin was at 9:00 which represented to the midline vaginal introitus and was 6 mm from this margin.  This same 9:00 margin was involved by VIN 2-3.  Interval Hx:  She reported no new symptoms of recurrence. She did  endorse recurrent caruncles on the labia.   Current Meds:  Outpatient Encounter Medications as of 09/21/2020  Medication Sig  . acetaminophen (TYLENOL) 500 MG tablet Take 500 mg by mouth every 6 (six) hours as needed (for pain.).  [name] Kitchen amiodarone (PACERONE) 200 MG tablet TAKE ONE-HALF TABLET BY MOUTH THREE DAYS A WEEK.  [name] Kitchen aspirin EC 81 MG tablet Take 81 mg by mouth daily.  [name] Kitchen co-enzyme Q-10 30 MG capsule Take 30 mg by mouth daily.  [name] Kitchen ELIQUIS 2.5 MG TABS tablet Take 2.5 mg by mouth 2 (two) times daily.  . ferrous sulfate 325 (65 FE) MG EC tablet 1 tablet  . furosemide (LASIX) 40 MG tablet Take 1 tablet (40 mg total) by mouth daily.  [name] Kitchen gabapentin (NEURONTIN) 300 MG capsule Take 300 mg by mouth at bedtime.   . Lancets (ONETOUCH DELICA PLUS [id number]) MISC USE AS DIRECTED DAILY FOR 30 DAYS  . loratadine (CLARITIN) 10 MG tablet Take 10 mg by mouth daily as needed for allergies.  . Multiple Vitamins-Minerals (CENTRUM SILVER 50+WOMEN) TABS Take 1 tablet by mouth daily.  Glory Rosebush VERIO test strip FOR USE WHEN CHECKING BLOOD GLUCOSE ONCE DAILY ALTERNATING AM AND PM BEFORE MEALS FINGER STICK 90 DAYS  . pravastatin (PRAVACHOL) 10 MG tablet Take 10 mg by mouth at bedtime.  [name] Kitchen spironolactone (ALDACTONE) 25 MG tablet Take 1 tablet (25 mg total) by mouth daily.  [name] Kitchen amoxicillin (AMOXIL) 500 MG capsule SMARTSIG:4 Capsule(s) By Mouth (Patient not taking: Reported on 09/15/2020)  . amoxicillin-clavulanate (AUGMENTIN) 875-125 MG tablet SMARTSIG:1 Tablet(s) By Mouth Every 12 Hours (Patient not taking: Reported on 09/15/2020)  . melatonin 5 MG TABS Take 5-10 mg by mouth at bedtime as needed. (Patient not taking: Reported on 09/15/2020)  . nitroGLYCERIN (NITROSTAT) 0.4 MG SL tablet Place 0.4 mg under the tongue every 5 (five) minutes x 3 doses as needed for chest pain.  (Patient not taking: Reported on 09/15/2020)  . [DISCONTINUED] senna (SENOKOT) 8.6 MG TABS tablet Take 1 tablet (8.6 mg total) by mouth at bedtime.  .  [DISCONTINUED] terconazole (TERAZOL 3) 0.8 % vaginal cream SMARTSIG:1 Application Vaginal Every Night   No facility-administered encounter medications on file as of 09/21/2020.    Allergy:  Allergies  Allergen Reactions  . Shellfish Allergy Anaphylaxis  . Shellfish-Derived Products Other (See Comments)    Social Hx:   Social History   Socioeconomic History  . Marital status: Widowed    Spouse name: Not on file  . Number of children: Not on file  . Years of education: Not on file  . Highest education level: Not on file  Occupational History  . Occupation: retired  Tobacco Use  . Smoking status: Former Smoker    Years: 50.00    Types: Cigarettes    Quit date: 05/29/2009    Years since quitting: 11.3  . Smokeless tobacco: Never Used  Vaping Use  . Vaping Use: Never used  Substance and Sexual Activity  . Alcohol use: Yes    Comment: rare  . Drug use: No  . Sexual activity:  Not Currently  Other Topics Concern  . Not on file  Social History Narrative   Tobacco use cigarettes: former smoker, quit in year Sept 2011, Pack-year Hx: 59, tobacco history last updated 10/10/2013. No smoking. Per patient stopped smoking 9 months ago. No alcohol. Caffeine.: yes, 2+ servings daily, tea. No recreational drug use. Exercise: [employer] water aer[employer]. Marital status: widow.   Social Determinants of Health   Financial Resource Strain: Not on file  Food Insecurity: Not on file  Transportation Needs: Not on file  Physical Activity: Not on file  Stress: Not on file  Social Connections: Not on file  Intimate Partner Violence: Not on file    Past Surgical Hx:  Past Surgical History:  Procedure Laterality Date  . ABDOMINAL HYSTERECTOMY    . BREAST BIOPSY Right 09/04/2012  . BREAST LUMPECTOMY Right 10/08/2012  . CLIPPING OF ATRIAL APPENDAGE N/A 05/09/2018   Procedure: CLIPPING OF ATRIAL APPENDAGE USING ATRICLIP PRO2 CLIP SIZE 45MM;  Surgeon: Rexene Alberts, MD;  Location: [place];  Service:  Open Heart Surgery;  Laterality: N/A;  . COLONOSCOPY    . DILATION AND CURETTAGE OF UTERUS    . LEFT HEART CATHETERIZATION WITH CORONARY ANGIOGRAM N/A 09/14/2014   Procedure: LEFT HEART CATHETERIZATION WITH CORONARY ANGIOGRAM;  Surgeon: [name], MD;  Location: [facility] CATH LAB;  Service: Cardiovascular;  Laterality: N/A;  . MAZE N/A 05/09/2018   Procedure: MAZE;  Surgeon: Rexene Alberts, MD;  Location: [place];  Service: Open Heart Surgery;  Laterality: N/A;  . MITRAL VALVE REPAIR N/A 05/09/2018   Procedure: MITRAL VALVE REPAIR (MVR) USING MEMO 4D RING SIZE 26MM;  Surgeon: Rexene Alberts, MD;  Location[place];  Service: Open Heart Surgery;  Laterality: N/A;  . OPEN REDUCTION INTERNAL FIXATION (ORIF) PROXIMAL PHALANX Right 01/04/2016   Procedure: OPEN TREATMENT OF RIGHT THUMB PROXIMAL PHALANX FRACTURE;  Surgeon: Milly Jakob, MD;  Location: [place];  Service: Orthopedics;  Laterality: Right;  . PARTIAL MASTECTOMY WITH NEEDLE LOCALIZATION AND AXILLARY SENTINEL LYMPH NODE BX Right 10/08/2012   Procedure: RIGHT PARTIAL MASTECTOMY WITH NEEDLE LOCALIZATION AND RIGHT AXILLARY SENTINEL LYMPH NODE BX;  Surgeon: Adin Hector, MD;  Location: [place];  Service: General;  Laterality: Right;  Injection of 1% Methylene Blue dye into right breast  . PORTACATH PLACEMENT Left 10/08/2012   Procedure: INSERTION PORT-A-CATH WITH ULTRASOUND GUIDANCE;  Surgeon: Adin Hector, MD;  Location: [place];  Service: General;  Laterality: Left;  Using 6F [place]  . Posterior Lumbar Arthrodesis, Pedicle screw fixation, Posterolateral Arthodesis  03/17/11  . RIGHT/LEFT HEART CATH AND CORONARY ANGIOGRAPHY N/A 05/02/2018   Procedure: RIGHT/LEFT HEART CATH AND CORONARY ANGIOGRAPHY;  Surgeon: [name], MD;  Location: [place] CV LAB;  Service: Cardiovascular;  Laterality: N/A;  . SHOULDER ARTHROSCOPY WITH ROTATOR CUFF REPAIR AND SUBACROMIAL DECOMPRESSION  06/04/2012   Procedure: SHOULDER  ARTHROSCOPY WITH ROTATOR CUFF REPAIR AND SUBACROMIAL DECOMPRESSION;  Surgeon: Nita Sells, MD;  Location: [place];  Service: Orthopedics;  Laterality: Left;  LEFT SHOULDER ARTHROSCOPY WITH ROTATOR CUFF REPAIR AND SUBACROMIAL DECOMPRESSION AND DISTAL CLAVICLE RESECTION  . TEE WITHOUT CARDIOVERSION N/A 05/03/2018   Procedure: TRANSESOPHAGEAL ECHOCARDIOGRAM (TEE);  Surgeon: Dixie Dials, MD;  Location: [facility] ENDOSCOPY;  Service: Cardiovascular;  Laterality: N/A;  . TEE WITHOUT CARDIOVERSION N/A 05/09/2018   Procedure: TRANSESOPHAGEAL ECHOCARDIOGRAM (TEE);  Surgeon: Rexene Alberts, MD;  Location: [place];  Service: Open Heart Surgery;  Laterality: N/A;  . TONSILLECTOMY    .  TRICUSPID VALVE REPLACEMENT N/A 05/09/2018   Procedure: TRICUSPID VALVE REPAIR USING MC3 RING SIZE 28MM;  Surgeon: Rexene Alberts, MD;  Location: [place];  Service: Open Heart Surgery;  Laterality: N/A;  . TUBAL LIGATION    . VULVECTOMY Left 02/06/2019   Procedure: POSTERIOR RADICAL VULVECTOMY;  Surgeon: Everitt Amber, MD;  Location: [facility];  Service: Gynecology;  Laterality: Left;  [name] Kitchen VULVECTOMY PARTIAL    . Wide Local Excision  01/2010   Bilateral groin excisions, re-excision of vulva    Past Medical Hx:  Past Medical History:  Diagnosis Date  . Anxiety   . Arthritis    back  . Atrial fibrillation ([place])   . Breast cancer ([place])    right breast cancer - 3 years ago  . Chronic diastolic heart failure (HCC)    ECHO: 06/20/2017 - Grade 2 Diastolic Dysfunction   . Coronary artery disease   . Depression   . Diabetes mellitus without complication (HCC)    diet  . Dysrhythmia    afib  . Fibromyalgia   . Herniated disc   . Hyperlipemia   . Hypertension   . Iron deficiency anemia   . Longstanding persistent atrial fibrillation ([place])   . Lower back pain   . Lumbar stenosis    L4-5  . Personal history of chemotherapy   . Personal history of radiation therapy   . Pulmonary hypertension ([place])   .  S/P Maze operation for atrial fibrillation 05/09/2018   Complete bilateral atrial lesion set using bipolar radiofrequency and cryothermy ablation with clipping of LA appendage  . 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 S/P radiation therapy 01/23/2013-03/06/2013   Right breast / 46 Gy in 23 fractions / Right breast boost / 14 Gy in 7 fractions  . 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 Severe mitral valve regurgitation   . Spondylolysis   . 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 Status post chemotherapy 11/04/2012 - 01/06/2013.   Docetaxel/Cytoxan Q 3 Weeks x 4 cycles  . Torn rotator cuff   . Tricuspid regurgitation     Past Gynecological History:  See HPI. Hx of vulvectomy for vulvar cancer 2011. No LMP recorded. Patient has had a hysterectomy.  Family Hx:  Family History  Problem Relation Age of Onset  . Stroke Mother   . Alzheimer's disease Mother   . Heart attack Father   . Heart attack Brother   . Breast cancer Neg Hx     Review of Systems:  Constitutional  Feels well,   ENT Normal appearing ears and nares bilaterally Skin/Breast  No rash, sores, jaundice, itching, dryness Cardiovascular  No chest pain, shortness of breath, or edema  Pulmonary  No cough or wheeze.  Gastro Intestinal  No nausea, vomitting, or diarrhoea. No bright red blood per rectum, no abdominal pain, change in bowel movement, or constipation.  Genito Urinary  No frequency, urgency, dysuria,  Musculo Skeletal  + left inguinal pain with elevation of the leg Neurologic  No weakness, numbness, change in gait,  Psychology  No depression, anxiety, insomnia.   Vitals:  Blood pressure (!) 137/50, pulse 65, temperature (!) 97 F (36.1 C), temperature source Tympanic, resp. rate 16, height 5\' 7"  (1.702 m), weight 214 lb 9.6 oz (97.3 kg), SpO2 96 %.  Physical Exam: WD in NAD Neck  Supple NROM,  without any enlargements.  Lymph Node Survey No cervical supraclavicular or inguinal adenopathy Cardiovascular  Pulse normal rate, regularity and rhythm. S1 and S2 normal.  Lungs  Clear to auscultation bilateraly, without wheezes/crackles/rhonchi. Good air movement.  Skin  No rash/lesions/breakdown  Psychiatry  Alert and oriented to person, place, and time  Abdomen  Normoactive bowel sounds, abdomen soft, non-tender and obese without evidence of hernia.  Back No CVA tenderness Genito Urinary  Vulva/vagina: Normal external female genitalia.  Smooth vulva, no lesions, Acetic acid applied - no acetowhite changes. No nodularity. There is a 65mm caruncle on the left labia majora (no associated cellulitis).   Bladder/urethra:  No lesions or masses, well supported bladder  Vagina: grossly normal and free of lesions  Cervix and uterus surgically absent  Adnexa: no palpable masses. Rectal  Smooth rectum, smooth rectovaginal septum.  Extremities  No bilateral cyanosis, clubbing or edema.   Thereasa Solo, MD  09/21/2020, 2:18 PM

## 2020-09-28 DIAGNOSIS — N184 Chronic kidney disease, stage 4 (severe): Secondary | ICD-10-CM | POA: Diagnosis not present

## 2020-09-28 DIAGNOSIS — E1121 Type 2 diabetes mellitus with diabetic nephropathy: Secondary | ICD-10-CM | POA: Diagnosis not present

## 2020-09-28 DIAGNOSIS — I129 Hypertensive chronic kidney disease with stage 1 through stage 4 chronic kidney disease, or unspecified chronic kidney disease: Secondary | ICD-10-CM | POA: Diagnosis not present

## 2020-10-21 ENCOUNTER — Other Ambulatory Visit: Payer: Self-pay | Admitting: Geriatric Medicine

## 2020-10-21 DIAGNOSIS — R921 Mammographic calcification found on diagnostic imaging of breast: Secondary | ICD-10-CM

## 2020-10-26 DIAGNOSIS — E7800 Pure hypercholesterolemia, unspecified: Secondary | ICD-10-CM | POA: Diagnosis not present

## 2020-10-26 DIAGNOSIS — E1121 Type 2 diabetes mellitus with diabetic nephropathy: Secondary | ICD-10-CM | POA: Diagnosis not present

## 2020-10-26 DIAGNOSIS — I129 Hypertensive chronic kidney disease with stage 1 through stage 4 chronic kidney disease, or unspecified chronic kidney disease: Secondary | ICD-10-CM | POA: Diagnosis not present

## 2020-10-26 DIAGNOSIS — Z853 Personal history of malignant neoplasm of breast: Secondary | ICD-10-CM | POA: Diagnosis not present

## 2020-10-26 DIAGNOSIS — I5022 Chronic systolic (congestive) heart failure: Secondary | ICD-10-CM | POA: Diagnosis not present

## 2020-10-26 DIAGNOSIS — N184 Chronic kidney disease, stage 4 (severe): Secondary | ICD-10-CM | POA: Diagnosis not present

## 2020-10-26 DIAGNOSIS — E1169 Type 2 diabetes mellitus with other specified complication: Secondary | ICD-10-CM | POA: Diagnosis not present

## 2020-10-27 DIAGNOSIS — E119 Type 2 diabetes mellitus without complications: Secondary | ICD-10-CM | POA: Diagnosis not present

## 2020-10-27 DIAGNOSIS — I340 Nonrheumatic mitral (valve) insufficiency: Secondary | ICD-10-CM | POA: Diagnosis not present

## 2020-10-27 DIAGNOSIS — I10 Essential (primary) hypertension: Secondary | ICD-10-CM | POA: Diagnosis not present

## 2020-10-27 DIAGNOSIS — N189 Chronic kidney disease, unspecified: Secondary | ICD-10-CM | POA: Diagnosis not present

## 2020-10-27 DIAGNOSIS — I4891 Unspecified atrial fibrillation: Secondary | ICD-10-CM | POA: Diagnosis not present

## 2020-10-27 DIAGNOSIS — E785 Hyperlipidemia, unspecified: Secondary | ICD-10-CM | POA: Diagnosis not present

## 2020-10-28 ENCOUNTER — Ambulatory Visit
Admission: RE | Admit: 2020-10-28 | Discharge: 2020-10-28 | Disposition: A | Discharge status: Home / Self Care | Payer: Medicare Other | Source: Ambulatory Visit | Attending: Geriatric Medicine | Admitting: Geriatric Medicine

## 2020-10-28 ENCOUNTER — Other Ambulatory Visit: Payer: Self-pay

## 2020-10-28 DIAGNOSIS — R922 Inconclusive mammogram: Secondary | ICD-10-CM | POA: Diagnosis not present

## 2020-10-28 DIAGNOSIS — R921 Mammographic calcification found on diagnostic imaging of breast: Secondary | ICD-10-CM

## 2020-11-05 DIAGNOSIS — N390 Urinary tract infection, site not specified: Secondary | ICD-10-CM | POA: Diagnosis not present

## 2020-11-05 DIAGNOSIS — N184 Chronic kidney disease, stage 4 (severe): Secondary | ICD-10-CM | POA: Diagnosis not present

## 2020-11-23 ENCOUNTER — Other Ambulatory Visit: Payer: Self-pay

## 2020-11-23 ENCOUNTER — Encounter: Payer: Self-pay | Admitting: Podiatry

## 2020-11-23 ENCOUNTER — Ambulatory Visit: Payer: Medicare Other | Admitting: Podiatry

## 2020-11-23 DIAGNOSIS — N183 Chronic kidney disease, stage 3 unspecified: Secondary | ICD-10-CM

## 2020-11-23 DIAGNOSIS — I5022 Chronic systolic (congestive) heart failure: Secondary | ICD-10-CM | POA: Diagnosis not present

## 2020-11-23 DIAGNOSIS — E7800 Pure hypercholesterolemia, unspecified: Secondary | ICD-10-CM | POA: Diagnosis not present

## 2020-11-23 DIAGNOSIS — E1169 Type 2 diabetes mellitus with other specified complication: Secondary | ICD-10-CM | POA: Diagnosis not present

## 2020-11-23 DIAGNOSIS — M2011 Hallux valgus (acquired), right foot: Secondary | ICD-10-CM | POA: Diagnosis not present

## 2020-11-23 DIAGNOSIS — E1140 Type 2 diabetes mellitus with diabetic neuropathy, unspecified: Secondary | ICD-10-CM | POA: Diagnosis not present

## 2020-11-23 DIAGNOSIS — M79676 Pain in unspecified toe(s): Secondary | ICD-10-CM | POA: Diagnosis not present

## 2020-11-23 DIAGNOSIS — I129 Hypertensive chronic kidney disease with stage 1 through stage 4 chronic kidney disease, or unspecified chronic kidney disease: Secondary | ICD-10-CM | POA: Diagnosis not present

## 2020-11-23 DIAGNOSIS — B351 Tinea unguium: Secondary | ICD-10-CM | POA: Diagnosis not present

## 2020-11-23 DIAGNOSIS — N184 Chronic kidney disease, stage 4 (severe): Secondary | ICD-10-CM | POA: Diagnosis not present

## 2020-11-23 DIAGNOSIS — M2012 Hallux valgus (acquired), left foot: Secondary | ICD-10-CM | POA: Diagnosis not present

## 2020-11-23 DIAGNOSIS — E1121 Type 2 diabetes mellitus with diabetic nephropathy: Secondary | ICD-10-CM | POA: Diagnosis not present

## 2020-11-23 NOTE — Progress Notes (Signed)
This patient returns to my office for at risk foot care.  This patient requires this care by a professional since this patient will be at risk due to having chronic kidney disease and diabetes.    This patient is unable to cut nails herself since the patient cannot reach her  nails.These nails are painful walking and wearing shoes.  This patient presents for at risk foot care today.  General Appearance  Alert, conversant and in no acute stress.  Vascular  Dorsalis pedis and posterior tibial  pulses are palpable  bilaterally.  Capillary return is within normal limits  bilaterally. Temperature is within normal limits  bilaterally.  Neurologic  Senn-Weinstein monofilament wire test diminished   bilaterally. Muscle power within normal limits bilaterally.  Nails Thick disfigured discolored nails with subungual debris  from hallux to fifth toes bilaterally. No evidence of bacterial infection or drainage bilaterally.  Orthopedic  No limitations of motion  feet .  No crepitus or effusions noted.  No bony pathology or digital deformities noted.  HAV  B/L.    Skin  normotropic skin with no porokeratosis noted bilaterally.  No signs of infections or ulcers noted.     Onychomycosis  Pain in right toes  Pain in left toes  Consent was obtained for treatment procedures.   Mechanical debridement of nails 1-5  bilaterally performed with a nail nipper.  Filed with dremel without incident.   Patient wants diabetic shoes for 2022.  She is to make an appointment with Liliane Channel next visit for shoes due to neuropathy and HAV  B/L.   Return office visit   3 months                   Told patient to return for periodic foot care and evaluation due to potential at risk complications.   Gardiner Barefoot DPM

## 2020-11-30 NOTE — Progress Notes (Addendum)
Patient seen in office today by EJ with [facility] for diabetic shoe measurement and casted for custom inserts. Patient made a shoe selection. Dr. Felipa Eth treats the patient's diabetes at this time. Patient was advised that the office will call when the shoes are available for pick-up. Patient verbalized understanding.

## 2020-12-16 DIAGNOSIS — H5201 Hypermetropia, right eye: Secondary | ICD-10-CM | POA: Diagnosis not present

## 2020-12-16 DIAGNOSIS — H524 Presbyopia: Secondary | ICD-10-CM | POA: Diagnosis not present

## 2020-12-16 DIAGNOSIS — E119 Type 2 diabetes mellitus without complications: Secondary | ICD-10-CM | POA: Diagnosis not present

## 2020-12-16 DIAGNOSIS — Z961 Presence of intraocular lens: Secondary | ICD-10-CM | POA: Diagnosis not present

## 2020-12-23 DIAGNOSIS — I5022 Chronic systolic (congestive) heart failure: Secondary | ICD-10-CM | POA: Diagnosis not present

## 2020-12-23 DIAGNOSIS — E1169 Type 2 diabetes mellitus with other specified complication: Secondary | ICD-10-CM | POA: Diagnosis not present

## 2020-12-23 DIAGNOSIS — I129 Hypertensive chronic kidney disease with stage 1 through stage 4 chronic kidney disease, or unspecified chronic kidney disease: Secondary | ICD-10-CM | POA: Diagnosis not present

## 2020-12-23 DIAGNOSIS — N184 Chronic kidney disease, stage 4 (severe): Secondary | ICD-10-CM | POA: Diagnosis not present

## 2020-12-23 DIAGNOSIS — E1121 Type 2 diabetes mellitus with diabetic nephropathy: Secondary | ICD-10-CM | POA: Diagnosis not present

## 2020-12-23 DIAGNOSIS — E7800 Pure hypercholesterolemia, unspecified: Secondary | ICD-10-CM | POA: Diagnosis not present

## 2021-01-03 DIAGNOSIS — Z853 Personal history of malignant neoplasm of breast: Secondary | ICD-10-CM | POA: Diagnosis not present

## 2021-01-06 DIAGNOSIS — N2581 Secondary hyperparathyroidism of renal origin: Secondary | ICD-10-CM | POA: Diagnosis not present

## 2021-01-06 DIAGNOSIS — E1122 Type 2 diabetes mellitus with diabetic chronic kidney disease: Secondary | ICD-10-CM | POA: Diagnosis not present

## 2021-01-06 DIAGNOSIS — N184 Chronic kidney disease, stage 4 (severe): Secondary | ICD-10-CM | POA: Diagnosis not present

## 2021-01-06 DIAGNOSIS — D631 Anemia in chronic kidney disease: Secondary | ICD-10-CM | POA: Diagnosis not present

## 2021-01-06 DIAGNOSIS — R300 Dysuria: Secondary | ICD-10-CM | POA: Diagnosis not present

## 2021-01-06 DIAGNOSIS — R809 Proteinuria, unspecified: Secondary | ICD-10-CM | POA: Diagnosis not present

## 2021-01-06 DIAGNOSIS — N390 Urinary tract infection, site not specified: Secondary | ICD-10-CM | POA: Diagnosis not present

## 2021-01-06 DIAGNOSIS — I129 Hypertensive chronic kidney disease with stage 1 through stage 4 chronic kidney disease, or unspecified chronic kidney disease: Secondary | ICD-10-CM | POA: Diagnosis not present

## 2021-01-24 DIAGNOSIS — E1121 Type 2 diabetes mellitus with diabetic nephropathy: Secondary | ICD-10-CM | POA: Diagnosis not present

## 2021-01-24 DIAGNOSIS — I129 Hypertensive chronic kidney disease with stage 1 through stage 4 chronic kidney disease, or unspecified chronic kidney disease: Secondary | ICD-10-CM | POA: Diagnosis not present

## 2021-01-24 DIAGNOSIS — E1169 Type 2 diabetes mellitus with other specified complication: Secondary | ICD-10-CM | POA: Diagnosis not present

## 2021-01-24 DIAGNOSIS — I5022 Chronic systolic (congestive) heart failure: Secondary | ICD-10-CM | POA: Diagnosis not present

## 2021-01-24 DIAGNOSIS — N184 Chronic kidney disease, stage 4 (severe): Secondary | ICD-10-CM | POA: Diagnosis not present

## 2021-01-24 DIAGNOSIS — E7800 Pure hypercholesterolemia, unspecified: Secondary | ICD-10-CM | POA: Diagnosis not present

## 2021-02-01 ENCOUNTER — Encounter: Payer: Self-pay | Admitting: Podiatry

## 2021-02-01 ENCOUNTER — Other Ambulatory Visit: Payer: Self-pay

## 2021-02-01 ENCOUNTER — Ambulatory Visit (INDEPENDENT_AMBULATORY_CARE_PROVIDER_SITE_OTHER): Payer: Medicare Other | Admitting: Podiatry

## 2021-02-01 DIAGNOSIS — N183 Chronic kidney disease, stage 3 unspecified: Secondary | ICD-10-CM

## 2021-02-01 DIAGNOSIS — M2011 Hallux valgus (acquired), right foot: Secondary | ICD-10-CM | POA: Diagnosis not present

## 2021-02-01 DIAGNOSIS — M79676 Pain in unspecified toe(s): Secondary | ICD-10-CM

## 2021-02-01 DIAGNOSIS — B351 Tinea unguium: Secondary | ICD-10-CM | POA: Diagnosis not present

## 2021-02-01 DIAGNOSIS — M2012 Hallux valgus (acquired), left foot: Secondary | ICD-10-CM | POA: Diagnosis not present

## 2021-02-01 DIAGNOSIS — E1140 Type 2 diabetes mellitus with diabetic neuropathy, unspecified: Secondary | ICD-10-CM | POA: Diagnosis not present

## 2021-02-01 NOTE — Progress Notes (Signed)
This patient returns to my office for at risk foot care.  This patient requires this care by a professional since this patient will be at risk due to having chronic kidney disease and diabetes.    This patient is unable to cut nails herself since the patient cannot reach her  nails.These nails are painful walking and wearing shoes.  This patient presents for at risk foot care today.  General Appearance  Alert, conversant and in no acute stress.  Vascular  Dorsalis pedis are palpable  bilaterally.  Posterior tibial pulses  are weakly palpable.Capillary return is within normal limits  bilaterally. Temperature is within normal limits  bilaterally.  Neurologic  Senn-Weinstein monofilament wire test diminished   bilaterally. Muscle power within normal limits bilaterally.  Nails Thick disfigured discolored nails with subungual debris  from hallux to fifth toes bilaterally. No evidence of bacterial infection or drainage bilaterally.  Orthopedic  No limitations of motion  feet .  No crepitus or effusions noted.  No bony pathology or digital deformities noted.  HAV  B/L.    Skin  normotropic skin with no porokeratosis noted bilaterally.  No signs of infections or ulcers noted.     Onychomycosis  Pain in right toes  Pain in left toes  Consent was obtained for treatment procedures.   Mechanical debridement of nails 1-5  bilaterally performed with a nail nipper.  Filed with dremel without incident.  Call her at home to check on diabetic shoes.   Return office visit   3 months                   Told patient to return for periodic foot care and evaluation due to potential at risk complications.   Gardiner Barefoot DPM

## 2021-02-04 ENCOUNTER — Telehealth: Payer: Self-pay | Admitting: *Deleted

## 2021-02-04 NOTE — Telephone Encounter (Signed)
Called and moved the patient's appt from 9/21 to 9/12

## 2021-02-06 NOTE — Progress Notes (Signed)
Follow-up Note: Gyn-Onc  Consult was initially requested by Dr. Landry Mellow for the evaluation of Diane [name] 80 y.o. female  CC:  Chief Complaint  Patient presents with   Vulva cancer [facility])     Assessment/Plan:  [name]  is a 80 y.o.  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 Close (64m) deep margin and lateral margin also close (664m.  Recommend close surveillance with repeat exam in 6 months. She will follow-up with Dr JaDelsa Sales I am leaving the practice.   Advised regarding warm compresses and massage for the caruncles on the labia.   HPI: [name] is a 89ear old woman who was seen in consultation at the request of Dr. 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  In a remote history the patient was seeing my partner, Dr. PaNancy Marusp until 2016.  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  This was treated with a radical left posterior vulvectomy with bilateral inguinal lymphadenectomy.  Final pathology showed no metastatic disease.  She required no adjuvant therapy at that time.  She remained free of disease for 5 years and was discharged from care.  In December 2019 she underwent open heart surgery for a mitral and tricuspid valve replacement.  Following the surgery she was placed on Coumadin therapy and began experiencing intermittent vaginal spotting.  She was seen by Dr. CoLandry Mellown December 25, 2018.  Dr. CoLandry Mellowerformed a gynecologic evaluation and upon inspection of the external genitalia identified a fleshy pink exophytic 1.5 cm growth in the left introitus posteriorly.  This was biopsied on December 30, 2018 revealed high-grade vulvar intraepithelial neoplasia, VIN 3.  The biopsy margins were involved.  On February 06, 2019 she underwent a radical posterior left vulvectomy.  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  It had a depth of invasion of 1 mm.  No lymphovascular space invasion was identified.  The peripheral margins were negative for invasive carcinoma as was the deep margin.  The deep margin abutted the anterior rectal wall.  This margin was 4 mm from the tumor.  The closest peripheral margin was at 9:00 which represented to the midline vaginal introitus and was 6 mm from this margin.  This same 9:00 margin was involved by VIN 2-3. No further therapy was recommended.  Interval Hx:  She reported no new symptoms of recurrence. She did endorse recurrent caruncles on the labia.   Current Meds:  Outpatient Encounter Medications as of 02/07/2021  Medication Sig   acetaminophen (TYLENOL) 500 MG tablet Take 500 mg by mouth every 6 (six) hours as needed (for pain.).   amiodarone (PACERONE) 200 MG tablet TAKE ONE-HALF TABLET BY MOUTH THREE DAYS A WEEK.   amoxicillin (AMOXIL) 500 MG capsule SMARTSIG:4 Capsule(s) By Mouth (Patient not taking: Reported on 09/15/2020)   amoxicillin-clavulanate (AUGMENTIN) 875-125 MG tablet SMARTSIG:1 Tablet(s) By Mouth Every 12 Hours (Patient not taking: Reported on 09/15/2020)   aspirin EC 81 MG tablet Take 81 mg by mouth daily.   co-enzyme Q-10 30 MG capsule Take 30 mg by mouth daily.   COVID-19 At Home Antigen Test ([facility]) KIT Use as Directed on the Package   ELIQUIS 2.5 MG TABS tablet Take 2.5 mg by mouth 2 (two) times daily.  ferrous sulfate 325 (65 FE) MG EC tablet 1 tablet   FLUoxetine (PROZAC) 10 MG capsule Take by mouth.   furosemide (LASIX) 40 MG tablet Take 1 tablet (40 mg total) by mouth daily.   gabapentin (NEURONTIN) 300 MG capsule Take 300 mg by mouth at bedtime.    JARDIANCE 10 MG TABS tablet Take 10 mg by mouth daily.   Lancets (ONETOUCH DELICA PLUS [id number]) MISC USE AS DIRECTED DAILY FOR 30 DAYS   loratadine (CLARITIN) 10 MG tablet Take 10 mg by mouth daily  as needed for allergies.   melatonin 5 MG TABS Take 5-10 mg by mouth at bedtime as needed. (Patient not taking: Reported on 09/15/2020)   Multiple Vitamins-Minerals (CENTRUM SILVER 50+WOMEN) TABS Take 1 tablet by mouth daily.   nitrofurantoin, macrocrystal-monohydrate, (MACROBID) 100 MG capsule Take 100 mg by mouth 2 (two) times daily.   nitroGLYCERIN (NITROSTAT) 0.4 MG SL tablet Place 0.4 mg under the tongue every 5 (five) minutes x 3 doses as needed for chest pain.  (Patient not taking: Reported on 09/15/2020)   nitroGLYCERIN (NITROSTAT) 0.4 MG SL tablet Place under the tongue.   ONETOUCH VERIO test strip FOR USE WHEN CHECKING BLOOD GLUCOSE ONCE DAILY ALTERNATING AM AND PM BEFORE MEALS FINGER STICK 90 DAYS   pravastatin (PRAVACHOL) 10 MG tablet Take 10 mg by mouth at bedtime.   spironolactone (ALDACTONE) 25 MG tablet Take 0.5 tablets by mouth every morning.   No facility-administered encounter medications on file as of 02/07/2021.    Allergy:  Allergies  Allergen Reactions   Shellfish Allergy Anaphylaxis and Swelling    Other reaction(s): Other (See Comments)   Shellfish-Derived Products Other (See Comments)    Social Hx:   Social History   Socioeconomic History   Marital status: Widowed    Spouse name: Not on file   Number of children: Not on file   Years of education: Not on file   Highest education level: Not on file  Occupational History   Occupation: retired  Tobacco Use   Smoking status: Former    Years: 50.00    Types: Cigarettes    Quit date: 05/29/2009    Years since quitting: 11.7   Smokeless tobacco: Never  Vaping Use   Vaping Use: Never used  Substance and Sexual Activity   Alcohol use: Yes    Comment: rare   Drug use: No   Sexual activity: Not Currently  Other Topics Concern   Not on file  Social History Narrative   Tobacco use cigarettes: former smoker, quit in year Sept 2011, Pack-year Hx: 21, tobacco history last updated 10/10/2013. No smoking. Per  patient stopped smoking 9 months ago. No alcohol. Caffeine.: yes, 2+ servings daily, tea. No recreational drug use. Exercise: [employer] water [employer]. Marital status: widow.   Social Determinants of Health   Financial Resource Strain: Not on file  Food Insecurity: Not on file  Transportation Needs: Not on file  Physical Activity: Not on file  Stress: Not on file  Social Connections: Not on file  Intimate Partner Violence: Not on file    Past Surgical Hx:  Past Surgical History:  Procedure Laterality Date   ABDOMINAL HYSTERECTOMY     BREAST BIOPSY Right 09/04/2012   BREAST LUMPECTOMY Right 10/08/2012   CLIPPING OF ATRIAL APPENDAGE N/A 05/09/2018   Procedure: CLIPPING OF ATRIAL APPENDAGE USING ATRICLIP PRO2 CLIP SIZE 45MM;  Surgeon: Rexene Alberts, MD;  Location: [place];  Service: Open Heart Surgery;  Laterality: N/A;  COLONOSCOPY     DILATION AND CURETTAGE OF UTERUS     LEFT HEART CATHETERIZATION WITH CORONARY ANGIOGRAM N/A 09/14/2014   Procedure: LEFT HEART CATHETERIZATION WITH CORONARY ANGIOGRAM;  Surgeon: [name], MD;  Location: [facility] CATH LAB;  Service: Cardiovascular;  Laterality: N/A;   MAZE N/A 05/09/2018   Procedure: MAZE;  Surgeon: Rexene Alberts, MD;  Location: [place];  Service: Open Heart Surgery;  Laterality: N/A;   MITRAL VALVE REPAIR N/A 05/09/2018   Procedure: MITRAL VALVE REPAIR (MVR) USING MEMO 4D RING SIZE 26MM;  Surgeon: Rexene Alberts, MD;  Location: [place];  Service: Open Heart Surgery;  Laterality: N/A;   OPEN REDUCTION INTERNAL FIXATION (ORIF) PROXIMAL PHALANX Right 01/04/2016   Procedure: OPEN TREATMENT OF RIGHT THUMB PROXIMAL PHALANX FRACTURE;  Surgeon: Milly Jakob, MD;  Location: [place];  Service: Orthopedics;  Laterality: Right;   PARTIAL MASTECTOMY WITH NEEDLE LOCALIZATION AND AXILLARY SENTINEL LYMPH NODE BX Right 10/08/2012   Procedure: RIGHT PARTIAL MASTECTOMY WITH NEEDLE LOCALIZATION AND RIGHT AXILLARY SENTINEL LYMPH NODE BX;   Surgeon: Adin Hector, MD;  Location: [place];  Service: General;  Laterality: Right;  Injection of 1% Methylene Blue dye into right breast   PORTACATH PLACEMENT Left 10/08/2012   Procedure: INSERTION PORT-A-CATH WITH ULTRASOUND GUIDANCE;  Surgeon: Adin Hector, MD;  Location: [place];  Service: General;  Laterality: Left;  Using 28F Powerport [place]   Posterior Lumbar Arthrodesis, Pedicle screw fixation, Posterolateral Arthodesis  03/17/11   RIGHT/LEFT HEART CATH AND CORONARY ANGIOGRAPHY N/A 05/02/2018   Procedure: RIGHT/LEFT HEART CATH AND CORONARY ANGIOGRAPHY;  Surgeon: [name], MD;  Location: [place] CV LAB;  Service: Cardiovascular;  Laterality: N/A;   SHOULDER ARTHROSCOPY WITH ROTATOR CUFF REPAIR AND SUBACROMIAL DECOMPRESSION  06/04/2012   Procedure: SHOULDER ARTHROSCOPY WITH ROTATOR CUFF REPAIR AND SUBACROMIAL DECOMPRESSION;  Surgeon: Nita Sells, MD;  Location: [place];  Service: Orthopedics;  Laterality: Left;  LEFT SHOULDER ARTHROSCOPY WITH ROTATOR CUFF REPAIR AND SUBACROMIAL DECOMPRESSION AND DISTAL CLAVICLE RESECTION   TEE WITHOUT CARDIOVERSION N/A 05/03/2018   Procedure: TRANSESOPHAGEAL ECHOCARDIOGRAM (TEE);  Surgeon: Dixie Dials, MD;  Location: [facility] ENDOSCOPY;  Service: Cardiovascular;  Laterality: N/A;   TEE WITHOUT CARDIOVERSION N/A 05/09/2018   Procedure: TRANSESOPHAGEAL ECHOCARDIOGRAM (TEE);  Surgeon: Rexene Alberts, MD;  Location: [place];  Service: Open Heart Surgery;  Laterality: N/A;   TONSILLECTOMY     TRICUSPID VALVE REPLACEMENT N/A 05/09/2018   Procedure: TRICUSPID VALVE REPAIR USING MC3 RING SIZE 28MM;  Surgeon: Rexene Alberts, MD;  Location: [place];  Service: Open Heart Surgery;  Laterality: N/A;   TUBAL LIGATION     VULVECTOMY Left 02/06/2019   Procedure: POSTERIOR RADICAL VULVECTOMY;  Surgeon: Everitt Amber, MD;  Location: [facility];  Service: Gynecology;  Laterality: Left;   VULVECTOMY PARTIAL     Wide Local Excision  01/2010    Bilateral groin excisions, re-excision of vulva    Past Medical Hx:  Past Medical History:  Diagnosis Date   Anxiety    Arthritis    back   Atrial fibrillation ([place])    Breast cancer ([facility])    right breast cancer - 3 years ago   Chronic diastolic heart failure (HCC)    ECHO: 06/20/2017 - Grade 2 Diastolic Dysfunction    Coronary artery disease    Depression    Diabetes mellitus without complication (HCC)    diet   Dysrhythmia    afib   Fibromyalgia  Herniated disc    Hyperlipemia    Hypertension    Iron deficiency anemia    Longstanding persistent atrial fibrillation (HCC)    Lower back pain    Lumbar stenosis    L4-5   Personal history of chemotherapy    Personal history of radiation therapy    Pulmonary hypertension ([place])    S/P Maze operation for atrial fibrillation 05/09/2018   Complete bilateral atrial lesion set using bipolar radiofrequency and cryothermy ablation with clipping of LA appendage   S/P mitral valve repair 05/09/2018   26 mm Sorin Memo 4D ring annuloplasty   S/P radiation therapy 01/23/2013-03/06/2013   Right breast / 46 Gy in 23 fractions / Right breast boost / 14 Gy in 7 fractions   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   Docetaxel/Cytoxan Q 3 Weeks x 4 cycles   Torn rotator cuff    Tricuspid regurgitation     Past Gynecological History:  See HPI. Hx of vulvectomy for vulvar cancer 2011. No LMP recorded. Patient has had a hysterectomy.  Family Hx:  Family History  Problem Relation Age of Onset   Stroke Mother    Alzheimer's disease Mother    Heart attack Father    Heart attack Brother    Breast cancer Neg Hx     Review of Systems:  Constitutional  Feels well,   ENT Normal appearing ears and nares bilaterally Skin/Breast  No  rash, sores, jaundice, itching, dryness Cardiovascular  No chest pain, shortness of breath, or edema  Pulmonary  No cough or wheeze.  Gastro Intestinal  No nausea, vomitting, or diarrhoea. No bright red blood per rectum, no abdominal pain, change in bowel movement, or constipation.  Genito Urinary  No frequency, urgency, dysuria,  Musculo Skeletal  + left inguinal pain with elevation of the leg Neurologic  No weakness, numbness, change in gait,  Psychology  No depression, anxiety, insomnia.   Vitals:  Blood pressure (!) 131/59, pulse 60, temperature 98.2 F (36.8 C), temperature source Oral, resp. rate 18, height _0  (1.702 m), weight 203 lb 8 oz (92.3 kg), SpO2 95 %.  Physical Exam: WD in NAD Neck  Supple NROM, without any enlargements.  Lymph Node Survey No cervical supraclavicular or inguinal adenopathy Cardiovascular  Pulse normal rate, regularity and rhythm. S1 and S2 normal.  Lungs  Clear to auscultation bilateraly, without wheezes/crackles/rhonchi. Good air movement.  Skin  No rash/lesions/breakdown  Psychiatry  Alert and oriented to person, place, and time  Abdomen  Normoactive bowel sounds, abdomen soft, non-tender and obese without evidence of hernia.  Back No CVA tenderness Genito Urinary  Vulva/vagina: Normal external female genitalia.  Smooth vulva, no lesions, Acetic acid applied - no acetowhite changes. No nodularity. There is a 50m caruncle on the left labia majora (no associated cellulitis).   Bladder/urethra:  No lesions or masses, well supported bladder  Vagina: grossly normal and free of lesions  Cervix and uterus surgically absent  Adnexa: no palpable masses. Rectal  Smooth rectum, smooth rectovaginal septum.  Extremities  No bilateral cyanosis, clubbing or edema.   EThereasa Solo MD  02/07/2021, 2:08 PM

## 2021-02-07 ENCOUNTER — Inpatient Hospital Stay: Payer: Medicare Other | Attending: Gynecologic Oncology | Admitting: Gynecologic Oncology

## 2021-02-07 ENCOUNTER — Other Ambulatory Visit: Payer: Self-pay

## 2021-02-07 ENCOUNTER — Encounter: Payer: Self-pay | Admitting: Gynecologic Oncology

## 2021-02-07 VITALS — BP 131/59 | HR 60 | Temp 98.2°F | Resp 18 | Ht 67.0 in | Wt 203.5 lb

## 2021-02-07 DIAGNOSIS — N9089 Other specified noninflammatory disorders of vulva and perineum: Secondary | ICD-10-CM | POA: Diagnosis not present

## 2021-02-07 DIAGNOSIS — Z8544 Personal history of malignant neoplasm of other female genital organs: Secondary | ICD-10-CM | POA: Diagnosis present

## 2021-02-07 DIAGNOSIS — C519 Malignant neoplasm of vulva, unspecified: Secondary | ICD-10-CM

## 2021-02-07 DIAGNOSIS — Z923 Personal history of irradiation: Secondary | ICD-10-CM | POA: Insufficient documentation

## 2021-02-07 DIAGNOSIS — Z9221 Personal history of antineoplastic chemotherapy: Secondary | ICD-10-CM | POA: Insufficient documentation

## 2021-02-07 DIAGNOSIS — Z9079 Acquired absence of other genital organ(s): Secondary | ICD-10-CM | POA: Diagnosis not present

## 2021-02-07 NOTE — Patient Instructions (Signed)
Please notify Dr Denman George at phone number [phone] if you notice vulvar itch, a painless lump or bump, vulvar skin discoloration, or a new swelling in one leg.   Dr Denman George is departing the [place] at [facility] in October, 2022. Her partners and colleagues including Dr Berline Lopes, Dr Delsa Sale and Joylene John, Nurse Practitioner will be available to continue your care.   You are next scheduled to return to the Gynecologic Oncology office at the [facility] in March, 2023. Please call [phone] in January to request an appointment for March with Dr Serita Grit partner, Dr Lahoma Crocker.

## 2021-02-08 DIAGNOSIS — N184 Chronic kidney disease, stage 4 (severe): Secondary | ICD-10-CM | POA: Diagnosis not present

## 2021-02-16 ENCOUNTER — Ambulatory Visit: Payer: Medicare Other | Admitting: Gynecologic Oncology

## 2021-02-19 ENCOUNTER — Encounter (HOSPITAL_COMMUNITY): Payer: Self-pay | Admitting: Emergency Medicine

## 2021-02-19 ENCOUNTER — Other Ambulatory Visit: Payer: Self-pay

## 2021-02-19 ENCOUNTER — Emergency Department (HOSPITAL_COMMUNITY): Payer: Medicare Other

## 2021-02-19 ENCOUNTER — Emergency Department (HOSPITAL_COMMUNITY)
Admission: EM | Admit: 2021-02-19 | Discharge: 2021-02-19 | Disposition: A | Discharge status: Home / Self Care | Payer: Medicare Other | Attending: Emergency Medicine | Admitting: Emergency Medicine

## 2021-02-19 DIAGNOSIS — Z7901 Long term (current) use of anticoagulants: Secondary | ICD-10-CM | POA: Diagnosis not present

## 2021-02-19 DIAGNOSIS — I2510 Atherosclerotic heart disease of native coronary artery without angina pectoris: Secondary | ICD-10-CM | POA: Diagnosis not present

## 2021-02-19 DIAGNOSIS — R079 Chest pain, unspecified: Secondary | ICD-10-CM

## 2021-02-19 DIAGNOSIS — R0789 Other chest pain: Secondary | ICD-10-CM | POA: Diagnosis not present

## 2021-02-19 DIAGNOSIS — Z853 Personal history of malignant neoplasm of breast: Secondary | ICD-10-CM | POA: Diagnosis not present

## 2021-02-19 DIAGNOSIS — E1122 Type 2 diabetes mellitus with diabetic chronic kidney disease: Secondary | ICD-10-CM | POA: Insufficient documentation

## 2021-02-19 DIAGNOSIS — Z87891 Personal history of nicotine dependence: Secondary | ICD-10-CM | POA: Insufficient documentation

## 2021-02-19 DIAGNOSIS — N189 Chronic kidney disease, unspecified: Secondary | ICD-10-CM

## 2021-02-19 DIAGNOSIS — N183 Chronic kidney disease, stage 3 unspecified: Secondary | ICD-10-CM | POA: Insufficient documentation

## 2021-02-19 DIAGNOSIS — I5032 Chronic diastolic (congestive) heart failure: Secondary | ICD-10-CM | POA: Diagnosis not present

## 2021-02-19 DIAGNOSIS — Z7982 Long term (current) use of aspirin: Secondary | ICD-10-CM | POA: Diagnosis not present

## 2021-02-19 DIAGNOSIS — M25512 Pain in left shoulder: Secondary | ICD-10-CM | POA: Diagnosis not present

## 2021-02-19 DIAGNOSIS — Z8544 Personal history of malignant neoplasm of other female genital organs: Secondary | ICD-10-CM | POA: Diagnosis not present

## 2021-02-19 DIAGNOSIS — I130 Hypertensive heart and chronic kidney disease with heart failure and stage 1 through stage 4 chronic kidney disease, or unspecified chronic kidney disease: Secondary | ICD-10-CM | POA: Diagnosis not present

## 2021-02-19 DIAGNOSIS — R0602 Shortness of breath: Secondary | ICD-10-CM | POA: Diagnosis not present

## 2021-02-19 DIAGNOSIS — I129 Hypertensive chronic kidney disease with stage 1 through stage 4 chronic kidney disease, or unspecified chronic kidney disease: Secondary | ICD-10-CM | POA: Diagnosis not present

## 2021-02-19 LAB — CBC WITH DIFFERENTIAL/PLATELET
Abs Immature Granulocytes: 0.02 10*3/uL (ref 0.00–0.07)
Basophils Absolute: 0 10*3/uL (ref 0.0–0.1)
Basophils Relative: 1 %
Eosinophils Absolute: 0.1 10*3/uL (ref 0.0–0.5)
Eosinophils Relative: 2 %
HCT: 36.8 % (ref 36.0–46.0)
Hemoglobin: 11.4 g/dL — ABNORMAL LOW (ref 12.0–15.0)
Immature Granulocytes: 0 %
Lymphocytes Relative: 35 %
Lymphs Abs: 2.8 10*3/uL (ref 0.7–4.0)
MCH: 27 pg (ref 26.0–34.0)
MCHC: 31 g/dL (ref 30.0–36.0)
MCV: 87 fL (ref 80.0–100.0)
Monocytes Absolute: 0.7 10*3/uL (ref 0.1–1.0)
Monocytes Relative: 9 %
Neutro Abs: 4.3 10*3/uL (ref 1.7–7.7)
Neutrophils Relative %: 53 %
Platelets: 183 10*3/uL (ref 150–400)
RBC: 4.23 MIL/uL (ref 3.87–5.11)
RDW: 15.7 % — ABNORMAL HIGH (ref 11.5–15.5)
WBC: 7.9 10*3/uL (ref 4.0–10.5)
nRBC: 0 % (ref 0.0–0.2)

## 2021-02-19 LAB — TROPONIN I (HIGH SENSITIVITY)
Troponin I (High Sensitivity): 5 ng/L (ref <18)
Troponin I (High Sensitivity): 5 ng/L (ref <18)

## 2021-02-19 LAB — BASIC METABOLIC PANEL
Anion gap: 9 (ref 5–15)
BUN: 28 mg/dL — ABNORMAL HIGH (ref 8–23)
CO2: 23 mmol/L (ref 22–32)
Calcium: 8.8 mg/dL — ABNORMAL LOW (ref 8.9–10.3)
Chloride: 106 mmol/L (ref 98–111)
Creatinine, Ser: 2.12 mg/dL — ABNORMAL HIGH (ref 0.44–1.00)
GFR, Estimated: 24 mL/min — ABNORMAL LOW (ref >60)
Glucose, Bld: 105 mg/dL — ABNORMAL HIGH (ref 70–99)
Potassium: 3.8 mmol/L (ref 3.5–5.1)
Sodium: 138 mmol/L (ref 135–145)

## 2021-02-19 NOTE — Discharge Instructions (Addendum)
You were seen in the emergency department for on and off chest and left shoulder pain.  You had blood work EKG chest x-ray that did not show an obvious cause of your symptoms.  There was no evidence of heart attack.  Dr. Terrence Dupont wants you to follow-up in his office this week.  You can try Tylenol for your pain and also heat for your shoulder.  Return to the emergency department if any worsening or concerning symptoms

## 2021-02-19 NOTE — ED Notes (Signed)
Pt discharged and wheeled out of the ED in a wheel chair without difficulty. 

## 2021-02-19 NOTE — ED Notes (Signed)
Pt endorses heart valve replacement Dec 2020

## 2021-02-19 NOTE — ED Provider Notes (Signed)
[facility] EMERGENCY DEPARTMENT Provider Note   CSN: [id number] Arrival date & time: 02/19/21  1457     History Chief Complaint  Patient presents with   Chest Pain    Diane [name] is a 80 y.o. female.  She has a history of A. fib on Eliquis, valvular heart disease.  She is complaining of 2 weeks of intermittent left-sided chest pain.  Throbbing in nature.  She has been taking nitroglycerin intermittently for this.  Saw Dr. Terrence Dupont for it.  Pain continued today so she decided to have it evaluated.  No shortness of breath diaphoresis nausea or vomiting.  She said she was sick last week with chills and weakness.  The history is provided by the patient.  Chest Pain Pain location:  L chest Pain quality: throbbing   Pain radiates to:  L shoulder Pain severity:  Moderate Onset quality:  Gradual Duration:  2 weeks Timing:  Intermittent Progression:  Unchanged Chronicity:  New Relieved by:  Nothing Worsened by:  Nothing Ineffective treatments:  Nitroglycerin Associated symptoms: no abdominal pain, no cough, no diaphoresis, no fever, no headache, no nausea, no shortness of breath and no vomiting   Risk factors: diabetes mellitus, high cholesterol and hypertension    HPI: A 80 year old patient with a history of treated diabetes, hypertension, hypercholesterolemia and obesity presents for evaluation of chest pain. Initial onset of pain was more than 6 hours ago. The patient's chest pain is described as heaviness/pressure/tightness, is not worse with exertion and is relieved by nitroglycerin. The patient's chest pain is middle- or left-sided, is not well-localized, is not sharp and does radiate to the arms/jaw/neck. The patient does not complain of nausea and denies diaphoresis. The patient has no history of stroke, has no history of peripheral artery disease, has not smoked in the past 90 days and has no relevant family history of coronary artery disease (first  degree relative at less than age 29).   Past Medical History:  Diagnosis Date   Anxiety    Arthritis    back   Atrial fibrillation ([place])    Breast cancer ([place])    right breast cancer - 3 years ago   Chronic diastolic heart failure (HCC)    ECHO: 06/20/2017 - Grade 2 Diastolic Dysfunction    Coronary artery disease    Depression    Diabetes mellitus without complication (HCC)    diet   Dysrhythmia    afib   Fibromyalgia    Herniated disc    Hyperlipemia    Hypertension    Iron deficiency anemia    Longstanding persistent atrial fibrillation (HCC)    Lower back pain    Lumbar stenosis    L4-5   Personal history of chemotherapy    Personal history of radiation therapy    Pulmonary hypertension ([place])    S/P Maze operation for atrial fibrillation 05/09/2018   Complete bilateral atrial lesion set using bipolar radiofrequency and cryothermy ablation with clipping of LA appendage   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   Swelling of limb 01/23/2013   Oropharyngeal candidiasis 01/13/2013   Chemotherapy induced neutropenia (HCC) 01/13/2013   UTI (urinary tract infection) 12/25/2012   Cancer of central portion of right female breast (HCC) 09/06/2012   Vulva cancer (HCC) 08/16/2011   INSOMNIA 08/26/2010   HEADACHE 08/26/2010   TOBACCO USER 11/04/2009   POSTMENOPAUSAL STATUS 11/04/2009   DYSLIPIDEMIA 07/30/2009   HAIR LOSS 07/30/2009   ANEMIA 07/26/2009   SINUSITIS, ACUTE 04/02/2009   DIABETES MELLITUS, TYPE II 01/01/2009   URINARY TRACT INFECTION SITE NOT SPECIFIED 11/26/2008   BREAST PAIN, RIGHT 11/12/2008   ANGIOEDEMA 10/05/2008   CHEST PAIN, ATYPICAL 09/01/2008   ALLERGIC RHINITIS 05/05/2008   VAGINITIS 05/05/2008   CAROTID BRUIT, RIGHT 11/12/2007   SHOULDER PAIN, LEFT, CHRONIC 08/09/2007   DEGENERATIVE DISC DISEASE, 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   DEGENERATIVE DISC DISEASE, LUMBOSACRAL SPINE 08/09/2007   HYPERTENSION, BENIGN ESSENTIAL 03/20/2007   BRONCHITIS, ACUTE 03/20/2007   DEPRESSION 02/27/2007   MURMUR 02/27/2007    Past Surgical History:  Procedure Laterality Date   ABDOMINAL HYSTERECTOMY     BREAST BIOPSY Right 09/04/2012   BREAST LUMPECTOMY Right 10/08/2012   CLIPPING OF ATRIAL APPENDAGE N/A 05/09/2018   Procedure: CLIPPING OF ATRIAL APPENDAGE USING ATRICLIP PRO2 CLIP SIZE ;  Surgeon: Purcell Nails, MD;  Location: [facility] OR;  Service: Open Heart Surgery;   Laterality: N/A;   COLONOSCOPY     DILATION AND CURETTAGE OF UTERUS     LEFT HEART CATHETERIZATION WITH CORONARY ANGIOGRAM N/A 09/14/2014   Procedure: LEFT HEART CATHETERIZATION WITH CORONARY ANGIOGRAM;  Surgeon: Rinaldo Cloud, MD;  Location: [facility] CATH LAB;  Service: Cardiovascular;  Laterality: N/A;   MAZE N/A 05/09/2018   Procedure: MAZE;  Surgeon: Purcell Nails, MD;  Location: [facility] OR;  Service: Open Heart Surgery;  Laterality: N/A;   MITRAL VALVE REPAIR N/A 05/09/2018   Procedure: MITRAL VALVE REPAIR (MVR) USING MEMO 4D RING SIZE ;  Surgeon: Purcell Nails, MD;  Location: [facility] OR;  Service: Open Heart Surgery;  Laterality: N/A;   OPEN REDUCTION INTERNAL FIXATION (ORIF) PROXIMAL PHALANX Right 01/04/2016   Procedure: OPEN TREATMENT OF RIGHT THUMB PROXIMAL PHALANX FRACTURE;  Surgeon: Mack Hook, MD;  Location: [facility];  Service: Orthopedics;  Laterality: Right;   PARTIAL MASTECTOMY WITH NEEDLE LOCALIZATION AND AXILLARY SENTINEL LYMPH NODE BX Right 10/08/2012  Procedure: RIGHT PARTIAL MASTECTOMY WITH NEEDLE LOCALIZATION AND RIGHT AXILLARY SENTINEL LYMPH NODE BX;  Surgeon: Adin Hector, MD;  Location: [place];  Service: General;  Laterality: Right;  Injection of 1% Methylene Blue dye into right breast   PORTACATH PLACEMENT Left 10/08/2012   Procedure: INSERTION PORT-A-CATH WITH ULTRASOUND GUIDANCE;  Surgeon: Adin Hector, MD;  Location: [place];  Service: General;  Laterality: Left;  Using 61F Powerport Clearvue   Posterior Lumbar Arthrodesis, Pedicle screw fixation, Posterolateral Arthodesis  03/17/11   RIGHT/LEFT HEART CATH AND CORONARY ANGIOGRAPHY N/A 05/02/2018   Procedure: RIGHT/LEFT HEART CATH AND CORONARY ANGIOGRAPHY;  Surgeon: [name], MD;  Location: [place] CV LAB;  Service: Cardiovascular;  Laterality: N/A;   SHOULDER ARTHROSCOPY WITH ROTATOR CUFF REPAIR AND SUBACROMIAL DECOMPRESSION  06/04/2012   Procedure: SHOULDER ARTHROSCOPY WITH ROTATOR CUFF REPAIR AND  SUBACROMIAL DECOMPRESSION;  Surgeon: Nita Sells, MD;  Location: [facility];  Service: Orthopedics;  Laterality: Left;  LEFT SHOULDER ARTHROSCOPY WITH ROTATOR CUFF REPAIR AND SUBACROMIAL DECOMPRESSION AND DISTAL CLAVICLE RESECTION   TEE WITHOUT CARDIOVERSION N/A 05/03/2018   Procedure: TRANSESOPHAGEAL ECHOCARDIOGRAM (TEE);  Surgeon: Dixie Dials, MD;  Location: [facility] ENDOSCOPY;  Service: Cardiovascular;  Laterality: N/A;   TEE WITHOUT CARDIOVERSION N/A 05/09/2018   Procedure: TRANSESOPHAGEAL ECHOCARDIOGRAM (TEE);  Surgeon: Rexene Alberts, MD;  Location: [place];  Service: Open Heart Surgery;  Laterality: N/A;   TONSILLECTOMY     TRICUSPID VALVE REPLACEMENT N/A 05/09/2018   Procedure: TRICUSPID VALVE REPAIR USING MC3 RING SIZE 28MM;  Surgeon: Rexene Alberts, MD;  Location: [place];  Service: Open Heart Surgery;  Laterality: N/A;   TUBAL LIGATION     VULVECTOMY Left 02/06/2019   Procedure: POSTERIOR RADICAL VULVECTOMY;  Surgeon: Everitt Amber, MD;  Location: [facility] ORS;  Service: Gynecology;  Laterality: Left;   VULVECTOMY PARTIAL     Wide Local Excision  01/2010   Bilateral groin excisions, re-excision of vulva     OB History   No obstetric history on file.     Family History  Problem Relation Age of Onset   Stroke Mother    Alzheimer's disease Mother    Heart attack Father    Heart attack Brother    Breast cancer Neg Hx     Social History   Tobacco Use   Smoking status: Former    Years: 50.00    Types: Cigarettes    Quit date: 05/29/2009    Years since quitting: 11.7   Smokeless tobacco: Never  Vaping Use   Vaping Use: Never used  Substance Use Topics   Alcohol use: Yes    Comment: rare   Drug use: No    Home Medications Prior to Admission medications   Medication Sig Start Date End Date Taking? Authorizing Provider  acetaminophen (TYLENOL) 500 MG tablet Take 500 mg by mouth every 6 (six) hours as needed (for pain.).    [provider]   amiodarone (PACERONE) 200 MG tablet TAKE ONE-HALF TABLET BY MOUTH THREE DAYS A WEEK. 07/06/20   [provider]  amoxicillin (AMOXIL) 500 MG capsule SMARTSIG:4 Capsule(s) By Mouth Patient not taking: No sig reported 11/05/19   [provider]  amoxicillin-clavulanate (AUGMENTIN) 875-125 MG tablet SMARTSIG:1 Tablet(s) By Mouth Every 12 Hours Patient not taking: No sig reported 09/23/19   [provider]  aspirin EC 81 MG tablet Take 81 mg by mouth daily.    [provider]  co-enzyme Q-10 30 MG capsule Take 30  mg by mouth daily.    [provider]  COVID-19 At Home Antigen Test [facility] COVID-19 AG HOME TEST) KIT Use as Directed on the Package 12/08/20   [provider]  ELIQUIS 2.5 MG TABS tablet Take 2.5 mg by mouth 2 (two) times daily. 02/21/19   [provider]  ferrous sulfate 325 (65 FE) MG EC tablet 1 tablet 11/05/19   [provider]  FLUoxetine (PROZAC) 10 MG capsule Take by mouth.    [provider]  furosemide (LASIX) 40 MG tablet Take 1 tablet (40 mg total) by mouth daily. 05/20/18   Gold, Wayne E, PA-C  gabapentin (NEURONTIN) 300 MG capsule Take 300 mg by mouth at bedtime.  01/03/19   [provider]  JARDIANCE 10 MG TABS tablet Take 10 mg by mouth daily. 01/14/21   [provider]  Lancets (ONETOUCH DELICA PLUS [id number]) MISC USE AS DIRECTED DAILY FOR 30 DAYS 12/09/18   [provider]  loratadine (CLARITIN) 10 MG tablet Take 10 mg by mouth daily as needed for allergies.    [provider]  melatonin 5 MG TABS Take 5-10 mg by mouth at bedtime as needed.    [provider]  Multiple Vitamins-Minerals (CENTRUM SILVER 50+WOMEN) TABS Take 1 tablet by mouth daily.    [provider]  nitrofurantoin, macrocrystal-monohydrate, (MACROBID) 100 MG capsule Take 100 mg by mouth 2 (two) times daily. 01/13/21   [provider]  nitroGLYCERIN (NITROSTAT) 0.4 MG SL  tablet Place 0.4 mg under the tongue every 5 (five) minutes x 3 doses as needed for chest pain.  Patient not taking: Reported on 02/07/2021 12/10/18   [provider]  nitroGLYCERIN (NITROSTAT) 0.4 MG SL tablet Place under the tongue. 12/28/20   [provider]  ONETOUCH VERIO test strip FOR USE WHEN CHECKING BLOOD GLUCOSE ONCE DAILY ALTERNATING AM AND PM BEFORE MEALS FINGER STICK 90 DAYS 01/20/18   [provider]  pravastatin (PRAVACHOL) 10 MG tablet Take 10 mg by mouth at bedtime. 10/19/19   [provider]  spironolactone (ALDACTONE) 25 MG tablet Take 0.5 tablets by mouth every morning. 11/25/20   [provider]    Allergies    Shellfish allergy and Shellfish-derived products  Review of Systems   Review of Systems  Constitutional:  Negative for diaphoresis and fever.  HENT:  Negative for sore throat.   Eyes:  Negative for visual disturbance.  Respiratory:  Negative for cough and shortness of breath.   Cardiovascular:  Positive for chest pain.  Gastrointestinal:  Negative for abdominal pain, nausea and vomiting.  Genitourinary:  Negative for dysuria.  Musculoskeletal:  Negative for neck pain.  Skin:  Negative for rash.  Neurological:  Negative for headaches.   Physical Exam Updated Vital Signs BP (!) 158/69 (BP Location: Left Arm)   Pulse 66   Temp 98.2 F (36.8 C)   Resp 16   SpO2 100%   Physical Exam Vitals and nursing note reviewed.  Constitutional:      General: She is not in acute distress.    Appearance: Normal appearance. She is well-developed.  HENT:     Head: Normocephalic and atraumatic.  Eyes:     Conjunctiva/sclera: Conjunctivae normal.  Cardiovascular:     Rate and Rhythm: Normal rate and regular rhythm.     Heart sounds: No murmur heard. Pulmonary:     Effort: Pulmonary effort is normal. No respiratory distress.     Breath sounds: Normal breath sounds. No  stridor. No wheezing.  Abdominal:     Palpations: Abdomen  is soft.     Tenderness: There is no abdominal tenderness. There is no guarding or rebound.  Musculoskeletal:        General: No tenderness or deformity. Normal range of motion.     Cervical back: Neck supple.     Right lower leg: No tenderness.     Left lower leg: No tenderness.  Skin:    General: Skin is warm and dry.  Neurological:     General: No focal deficit present.     Mental Status: She is alert.     GCS: GCS eye subscore is 4. GCS verbal subscore is 5. GCS motor subscore is 6.    ED Results / Procedures / Treatments   Labs (all labs ordered are listed, but only abnormal results are displayed) Labs Reviewed  BASIC METABOLIC PANEL - Abnormal; Notable for the following components:      Result Value   Glucose, Bld 105 (*)    BUN 28 (*)    Creatinine, Ser 2.12 (*)    Calcium 8.8 (*)    GFR, Estimated 24 (*)    All other components within normal limits  CBC WITH DIFFERENTIAL/PLATELET - Abnormal; Notable for the following components:   Hemoglobin 11.4 (*)    RDW 15.7 (*)    All other components within normal limits  TROPONIN I (HIGH SENSITIVITY)  TROPONIN I (HIGH SENSITIVITY)    EKG EKG Interpretation  Date/Time:  Saturday February 19 2021 15:03:18 EDT Ventricular Rate:  67 PR Interval:    QRS Duration: 92 QT Interval:  376 QTC Calculation: 397 R Axis:   43 Text Interpretation: Accelerated Junctional rhythm vs sinus Nonspecific T wave abnormality Abnormal ECG No significant change since prior 9/20 Confirmed by Aletta Edouard [phone]) on 02/19/2021 4:32:38 PM  Radiology DG Chest 2 View  Result Date: 02/19/2021 CLINICAL DATA:  Chest pain and shortness of breath. EXAM: CHEST - 2 VIEW COMPARISON:  06/03/2018 and older studies. FINDINGS: Stable changes from previous cardiac surgery. Cardiac silhouette mildly enlarged. No mediastinal or hilar masses or evidence of adenopathy. Clear lungs. No pleural effusion or pneumothorax. Eventration of the anterior right  hemidiaphragm. Skeletal structures are intact. IMPRESSION: No acute cardiopulmonary disease. Electronically Signed   By: Lajean Manes M.D.   On: 02/19/2021 16:03    Procedures Procedures   Medications Ordered in ED Medications - No data to display  ED Course  I have reviewed the triage vital signs and the nursing notes.  Pertinent labs & imaging results that were available during my care of the patient were reviewed by me and considered in my medical decision making (see chart for details).  Clinical Course as of 02/20/21 1020  Sat Feb 19, 2021  1736 Discussed with Dr. Terrence Dupont patient's cardiologist.  We reviewed her last cardiac cath which was about 2 years ago.  She has noncritical disease at that time.  He felt if her second troponin was flat that she could follow-up in the office within this week. [MB]    Clinical Course User Index [MB] Hayden Rasmussen, MD   MDM Rules/Calculators/A&P HEAR Score: 7                        This patient complains of left chest and shoulder pain; this involves an extensive number of treatment Options and is a complaint that carries with it a high risk of complications and  Morbidity. The differential includes ACS, pneumonia, pneumothorax, PE, musculoskeletal, radiculopathy  I ordered, reviewed and interpreted labs, which included CBC with normal white count hemoglobin low stable from priors, chemistries with chronic CKD slightly worse than priors, troponins flat I ordered imaging studies which included chest x-ray and I independently    visualized and interpreted imaging which showed no acute findings Previous records obtained and reviewed in epic no recent admissions I consulted Dr. Terrence Dupont cardiology and discussed lab and imaging findings  Critical Interventions: None  After the interventions stated above, I reevaluated the patient and found patient's pain to be improved.  She has been eating and drinking here without any difficulty.  Reviewed  cardiology recommendations with patient and she is comfortable plan for discharge and outpatient follow-up.  Counseled to increase her fluid intake.  Return instructions discussed   Final Clinical Impression(s) / ED Diagnoses Final diagnoses:  Nonspecific chest pain  Acute pain of left shoulder  Chronic kidney disease, unspecified CKD stage    Rx / DC Orders ED Discharge Orders     None        Hayden Rasmussen, MD 02/20/21 1023

## 2021-02-19 NOTE — ED Provider Notes (Signed)
Emergency Medicine Provider Triage Evaluation Note  Diane [name] , a 80 y.o. female  was evaluated in triage.  Pt complains of chest pain.  Intermittent for several weeks.  Minimal improvement noted with nitroglycerin.  Last dose of this was several hours ago.  Last saw her cardiologist 2 weeks ago and states that "he told me my heart did not look good but it did not look that bad."  Denying any leg swelling but does reports associated shortness of breath.  Prior valve replacements.  Review of Systems  Positive: Chest pain, shortness of breath Negative: Leg swelling  Physical Exam  BP (!) 158/69 (BP Location: Left Arm)   Pulse 66   Temp 98.2 F (36.8 C)   Resp 16   SpO2 100%  Gen:   Awake, no distress   Resp:  Normal effort  MSK:   Moves extremities without difficulty  Other:  No signs of distress  Medical Decision Making  Medically screening exam initiated at 3:14 PM.  Appropriate orders placed.  Diane Drye Gallus was informed that the remainder of the evaluation will be completed by another provider, this initial triage assessment does not replace that evaluation, and the importance of remaining in the ED until their evaluation is complete.  Lab work ordered   Delia Heady, PA-C 02/19/21 [place]    Fredia Sorrow, MD 02/28/21 1314

## 2021-02-19 NOTE — ED Triage Notes (Signed)
Pt c/o intermittent chest pain x 2 weeks. Pain initially started under the left breast, is now radiating to the left shoulder. Describes pain as throbbing.

## 2021-02-19 NOTE — ED Notes (Signed)
Pt on monitor, placed in gown. No requests at this time

## 2021-02-22 DIAGNOSIS — I129 Hypertensive chronic kidney disease with stage 1 through stage 4 chronic kidney disease, or unspecified chronic kidney disease: Secondary | ICD-10-CM | POA: Diagnosis not present

## 2021-02-22 DIAGNOSIS — E7800 Pure hypercholesterolemia, unspecified: Secondary | ICD-10-CM | POA: Diagnosis not present

## 2021-02-22 DIAGNOSIS — E1121 Type 2 diabetes mellitus with diabetic nephropathy: Secondary | ICD-10-CM | POA: Diagnosis not present

## 2021-02-22 DIAGNOSIS — E1169 Type 2 diabetes mellitus with other specified complication: Secondary | ICD-10-CM | POA: Diagnosis not present

## 2021-02-22 DIAGNOSIS — I5022 Chronic systolic (congestive) heart failure: Secondary | ICD-10-CM | POA: Diagnosis not present

## 2021-02-22 DIAGNOSIS — N184 Chronic kidney disease, stage 4 (severe): Secondary | ICD-10-CM | POA: Diagnosis not present

## 2021-03-10 DIAGNOSIS — N390 Urinary tract infection, site not specified: Secondary | ICD-10-CM | POA: Diagnosis not present

## 2021-03-10 DIAGNOSIS — N184 Chronic kidney disease, stage 4 (severe): Secondary | ICD-10-CM | POA: Diagnosis not present

## 2021-03-22 DIAGNOSIS — E1121 Type 2 diabetes mellitus with diabetic nephropathy: Secondary | ICD-10-CM | POA: Diagnosis not present

## 2021-03-22 DIAGNOSIS — E7800 Pure hypercholesterolemia, unspecified: Secondary | ICD-10-CM | POA: Diagnosis not present

## 2021-03-22 DIAGNOSIS — I129 Hypertensive chronic kidney disease with stage 1 through stage 4 chronic kidney disease, or unspecified chronic kidney disease: Secondary | ICD-10-CM | POA: Diagnosis not present

## 2021-03-22 DIAGNOSIS — E1169 Type 2 diabetes mellitus with other specified complication: Secondary | ICD-10-CM | POA: Diagnosis not present

## 2021-03-22 DIAGNOSIS — N184 Chronic kidney disease, stage 4 (severe): Secondary | ICD-10-CM | POA: Diagnosis not present

## 2021-03-22 DIAGNOSIS — I5022 Chronic systolic (congestive) heart failure: Secondary | ICD-10-CM | POA: Diagnosis not present

## 2021-04-12 DIAGNOSIS — N2581 Secondary hyperparathyroidism of renal origin: Secondary | ICD-10-CM | POA: Diagnosis not present

## 2021-04-12 DIAGNOSIS — E1122 Type 2 diabetes mellitus with diabetic chronic kidney disease: Secondary | ICD-10-CM | POA: Diagnosis not present

## 2021-04-12 DIAGNOSIS — N184 Chronic kidney disease, stage 4 (severe): Secondary | ICD-10-CM | POA: Diagnosis not present

## 2021-04-12 DIAGNOSIS — D631 Anemia in chronic kidney disease: Secondary | ICD-10-CM | POA: Diagnosis not present

## 2021-04-12 DIAGNOSIS — E785 Hyperlipidemia, unspecified: Secondary | ICD-10-CM | POA: Diagnosis not present

## 2021-04-12 DIAGNOSIS — I129 Hypertensive chronic kidney disease with stage 1 through stage 4 chronic kidney disease, or unspecified chronic kidney disease: Secondary | ICD-10-CM | POA: Diagnosis not present

## 2021-04-12 DIAGNOSIS — N189 Chronic kidney disease, unspecified: Secondary | ICD-10-CM | POA: Diagnosis not present

## 2021-04-12 DIAGNOSIS — N390 Urinary tract infection, site not specified: Secondary | ICD-10-CM | POA: Diagnosis not present

## 2021-04-12 DIAGNOSIS — N281 Cyst of kidney, acquired: Secondary | ICD-10-CM | POA: Diagnosis not present

## 2021-04-22 DIAGNOSIS — E1169 Type 2 diabetes mellitus with other specified complication: Secondary | ICD-10-CM | POA: Diagnosis not present

## 2021-04-22 DIAGNOSIS — E1121 Type 2 diabetes mellitus with diabetic nephropathy: Secondary | ICD-10-CM | POA: Diagnosis not present

## 2021-04-22 DIAGNOSIS — I129 Hypertensive chronic kidney disease with stage 1 through stage 4 chronic kidney disease, or unspecified chronic kidney disease: Secondary | ICD-10-CM | POA: Diagnosis not present

## 2021-04-22 DIAGNOSIS — N184 Chronic kidney disease, stage 4 (severe): Secondary | ICD-10-CM | POA: Diagnosis not present

## 2021-04-22 DIAGNOSIS — E7800 Pure hypercholesterolemia, unspecified: Secondary | ICD-10-CM | POA: Diagnosis not present

## 2021-05-04 DIAGNOSIS — I340 Nonrheumatic mitral (valve) insufficiency: Secondary | ICD-10-CM | POA: Diagnosis not present

## 2021-05-04 DIAGNOSIS — I480 Paroxysmal atrial fibrillation: Secondary | ICD-10-CM | POA: Diagnosis not present

## 2021-05-04 DIAGNOSIS — I10 Essential (primary) hypertension: Secondary | ICD-10-CM | POA: Diagnosis not present

## 2021-05-04 DIAGNOSIS — E119 Type 2 diabetes mellitus without complications: Secondary | ICD-10-CM | POA: Diagnosis not present

## 2021-05-04 DIAGNOSIS — R0789 Other chest pain: Secondary | ICD-10-CM | POA: Diagnosis not present

## 2021-05-04 DIAGNOSIS — E785 Hyperlipidemia, unspecified: Secondary | ICD-10-CM | POA: Diagnosis not present

## 2021-05-16 ENCOUNTER — Encounter: Payer: Self-pay | Admitting: Podiatry

## 2021-05-16 ENCOUNTER — Ambulatory Visit: Payer: Medicare Other | Admitting: Podiatry

## 2021-05-16 ENCOUNTER — Other Ambulatory Visit: Payer: Self-pay

## 2021-05-16 DIAGNOSIS — E1140 Type 2 diabetes mellitus with diabetic neuropathy, unspecified: Secondary | ICD-10-CM

## 2021-05-16 DIAGNOSIS — M79676 Pain in unspecified toe(s): Secondary | ICD-10-CM

## 2021-05-16 DIAGNOSIS — B351 Tinea unguium: Secondary | ICD-10-CM | POA: Diagnosis not present

## 2021-05-16 DIAGNOSIS — N183 Chronic kidney disease, stage 3 unspecified: Secondary | ICD-10-CM

## 2021-05-16 DIAGNOSIS — M2011 Hallux valgus (acquired), right foot: Secondary | ICD-10-CM

## 2021-05-16 DIAGNOSIS — M2012 Hallux valgus (acquired), left foot: Secondary | ICD-10-CM

## 2021-05-16 NOTE — Progress Notes (Signed)
This patient returns to my office for at risk foot care.  This patient requires this care by a professional since this patient will be at risk due to having chronic kidney disease and diabetes.    This patient is unable to cut nails herself since the patient cannot reach her  nails.These nails are painful walking and wearing shoes.  This patient presents for at risk foot care today.  General Appearance  Alert, conversant and in no acute stress.  Vascular  Dorsalis pedis are palpable  bilaterally.  Posterior tibial pulses  are weakly palpable.Capillary return is within normal limits  bilaterally. Temperature is within normal limits  bilaterally.  Neurologic  Senn-Weinstein monofilament wire test diminished   bilaterally. Muscle power within normal limits bilaterally.  Nails Thick disfigured discolored nails with subungual debris  from hallux to fifth toes bilaterally. No evidence of bacterial infection or drainage bilaterally.  Orthopedic  No limitations of motion  feet .  No crepitus or effusions noted.  No bony pathology or digital deformities noted.  HAV  B/L.    Skin  normotropic skin with no porokeratosis noted bilaterally.  No signs of infections or ulcers noted.     Onychomycosis  Pain in right toes  Pain in left toes  Consent was obtained for treatment procedures.   Mechanical debridement of nails 1-5  bilaterally performed with a nail nipper.  Filed with dremel without incident.  Patient to make an appointment with Aaron Edelman for shoes.   Return office visit   3 months                   Told patient to return for periodic foot care and evaluation due to potential at risk complications.   Gardiner Barefoot DPM

## 2021-05-26 DIAGNOSIS — E7800 Pure hypercholesterolemia, unspecified: Secondary | ICD-10-CM | POA: Diagnosis not present

## 2021-05-26 DIAGNOSIS — I5022 Chronic systolic (congestive) heart failure: Secondary | ICD-10-CM | POA: Diagnosis not present

## 2021-05-26 DIAGNOSIS — E1169 Type 2 diabetes mellitus with other specified complication: Secondary | ICD-10-CM | POA: Diagnosis not present

## 2021-05-26 DIAGNOSIS — N184 Chronic kidney disease, stage 4 (severe): Secondary | ICD-10-CM | POA: Diagnosis not present

## 2021-05-26 DIAGNOSIS — E1121 Type 2 diabetes mellitus with diabetic nephropathy: Secondary | ICD-10-CM | POA: Diagnosis not present

## 2021-05-26 DIAGNOSIS — I129 Hypertensive chronic kidney disease with stage 1 through stage 4 chronic kidney disease, or unspecified chronic kidney disease: Secondary | ICD-10-CM | POA: Diagnosis not present

## 2021-06-03 DIAGNOSIS — L304 Erythema intertrigo: Secondary | ICD-10-CM | POA: Diagnosis not present

## 2021-06-09 ENCOUNTER — Ambulatory Visit: Payer: Medicare Other

## 2021-06-09 ENCOUNTER — Other Ambulatory Visit: Payer: Self-pay

## 2021-06-09 DIAGNOSIS — E1140 Type 2 diabetes mellitus with diabetic neuropathy, unspecified: Secondary | ICD-10-CM

## 2021-06-09 DIAGNOSIS — M2011 Hallux valgus (acquired), right foot: Secondary | ICD-10-CM

## 2021-06-09 DIAGNOSIS — M2012 Hallux valgus (acquired), left foot: Secondary | ICD-10-CM

## 2021-06-09 NOTE — Progress Notes (Signed)
SITUATION Reason for Consult: Evaluation for Prefabricated Diabetic Shoes and Bilateral Custom Diabetic Inserts. Patient / Caregiver Report: Patient would like well fitting shoes  OBJECTIVE DATA: Patient History / Diagnosis:    ICD-10-CM   1. Type 2 diabetes, controlled, with neuropathy ([place])  E11.40     2. Hallux valgus, acquired, right  M20.11     3. Acquired hallux valgus of left foot  M20.12       Current or Previous Devices:   Patient is long time historical user of diabetic shoes  In-Person Foot Examination: Ulcers & Callousing:   None and no history  Toe / Foot Deformities:   - Pes Planus  - Hammertoes   Shoe Size: 10.58M  ORTHOTIC RECOMMENDATION Recommended Devices: - 1x pair prefabricated PDAC approved diabetic shoes: [id number] 10.58M - 3x pair custom-to-patient vacuum formed diabetic insoles.   GOALS OF SHOES AND INSOLES - Reduce shear and pressure - Reduce / Prevent callus formation - Reduce / Prevent ulceration - Protect the fragile healing compromised diabetic foot.  Patient would benefit from diabetic shoes and inserts as patient has diabetes mellitus and the patient has one or more of the following conditions: - History of partial or complete amputation of the foot - History of previous foot ulceration. - History of pre-ulcerative callus - Peripheral neuropathy with evidence of callus formation - Foot deformity - Poor circulation  ACTIONS PERFORMED Patient was casted for insoles via crush box and measured for shoes via brannock device. Procedure was explained and patient tolerated procedure well. All questions were answered and concerns addressed.  PLAN Patient is to ensure treating physician receives and completes diabetic paperwork. Casts and shoe order are to be held until paperwork is received. Once received patient is to be scheduled for fitting in four weeks.

## 2021-06-14 ENCOUNTER — Telehealth: Payer: Self-pay | Admitting: *Deleted

## 2021-06-14 DIAGNOSIS — Z0000 Encounter for general adult medical examination without abnormal findings: Secondary | ICD-10-CM | POA: Diagnosis not present

## 2021-06-14 DIAGNOSIS — D6869 Other thrombophilia: Secondary | ICD-10-CM | POA: Diagnosis not present

## 2021-06-14 DIAGNOSIS — I4811 Longstanding persistent atrial fibrillation: Secondary | ICD-10-CM | POA: Diagnosis not present

## 2021-06-14 DIAGNOSIS — E7800 Pure hypercholesterolemia, unspecified: Secondary | ICD-10-CM | POA: Diagnosis not present

## 2021-06-14 DIAGNOSIS — M25532 Pain in left wrist: Secondary | ICD-10-CM | POA: Diagnosis not present

## 2021-06-14 DIAGNOSIS — N184 Chronic kidney disease, stage 4 (severe): Secondary | ICD-10-CM | POA: Diagnosis not present

## 2021-06-14 DIAGNOSIS — I5022 Chronic systolic (congestive) heart failure: Secondary | ICD-10-CM | POA: Diagnosis not present

## 2021-06-14 DIAGNOSIS — Z79899 Other long term (current) drug therapy: Secondary | ICD-10-CM | POA: Diagnosis not present

## 2021-06-14 DIAGNOSIS — Z1389 Encounter for screening for other disorder: Secondary | ICD-10-CM | POA: Diagnosis not present

## 2021-06-14 DIAGNOSIS — I700 Atherosclerosis of aorta: Secondary | ICD-10-CM | POA: Diagnosis not present

## 2021-06-14 DIAGNOSIS — E1169 Type 2 diabetes mellitus with other specified complication: Secondary | ICD-10-CM | POA: Diagnosis not present

## 2021-06-14 NOTE — Telephone Encounter (Signed)
Patient called and scheduled a follow up appt with Dr Tucker  

## 2021-06-16 DIAGNOSIS — M19042 Primary osteoarthritis, left hand: Secondary | ICD-10-CM | POA: Diagnosis not present

## 2021-06-20 DIAGNOSIS — E1121 Type 2 diabetes mellitus with diabetic nephropathy: Secondary | ICD-10-CM | POA: Diagnosis not present

## 2021-06-20 DIAGNOSIS — I5022 Chronic systolic (congestive) heart failure: Secondary | ICD-10-CM | POA: Diagnosis not present

## 2021-06-20 DIAGNOSIS — E1169 Type 2 diabetes mellitus with other specified complication: Secondary | ICD-10-CM | POA: Diagnosis not present

## 2021-06-20 DIAGNOSIS — I129 Hypertensive chronic kidney disease with stage 1 through stage 4 chronic kidney disease, or unspecified chronic kidney disease: Secondary | ICD-10-CM | POA: Diagnosis not present

## 2021-06-20 DIAGNOSIS — E7800 Pure hypercholesterolemia, unspecified: Secondary | ICD-10-CM | POA: Diagnosis not present

## 2021-06-20 DIAGNOSIS — N184 Chronic kidney disease, stage 4 (severe): Secondary | ICD-10-CM | POA: Diagnosis not present

## 2021-07-11 DIAGNOSIS — N184 Chronic kidney disease, stage 4 (severe): Secondary | ICD-10-CM | POA: Diagnosis not present

## 2021-07-14 DIAGNOSIS — M19042 Primary osteoarthritis, left hand: Secondary | ICD-10-CM | POA: Diagnosis not present

## 2021-07-14 DIAGNOSIS — M1812 Unilateral primary osteoarthritis of first carpometacarpal joint, left hand: Secondary | ICD-10-CM | POA: Diagnosis not present

## 2021-07-18 ENCOUNTER — Other Ambulatory Visit: Payer: Self-pay

## 2021-07-18 ENCOUNTER — Ambulatory Visit (INDEPENDENT_AMBULATORY_CARE_PROVIDER_SITE_OTHER): Payer: Medicare Other

## 2021-07-18 DIAGNOSIS — M6788 Other specified disorders of synovium and tendon, other site: Secondary | ICD-10-CM

## 2021-07-18 DIAGNOSIS — N184 Chronic kidney disease, stage 4 (severe): Secondary | ICD-10-CM | POA: Diagnosis not present

## 2021-07-18 DIAGNOSIS — M2011 Hallux valgus (acquired), right foot: Secondary | ICD-10-CM | POA: Diagnosis not present

## 2021-07-18 DIAGNOSIS — M2012 Hallux valgus (acquired), left foot: Secondary | ICD-10-CM

## 2021-07-18 DIAGNOSIS — I129 Hypertensive chronic kidney disease with stage 1 through stage 4 chronic kidney disease, or unspecified chronic kidney disease: Secondary | ICD-10-CM | POA: Diagnosis not present

## 2021-07-18 DIAGNOSIS — E1140 Type 2 diabetes mellitus with diabetic neuropathy, unspecified: Secondary | ICD-10-CM | POA: Diagnosis not present

## 2021-07-18 DIAGNOSIS — N281 Cyst of kidney, acquired: Secondary | ICD-10-CM | POA: Diagnosis not present

## 2021-07-18 DIAGNOSIS — N2581 Secondary hyperparathyroidism of renal origin: Secondary | ICD-10-CM | POA: Diagnosis not present

## 2021-07-18 DIAGNOSIS — E1122 Type 2 diabetes mellitus with diabetic chronic kidney disease: Secondary | ICD-10-CM | POA: Diagnosis not present

## 2021-07-18 DIAGNOSIS — D631 Anemia in chronic kidney disease: Secondary | ICD-10-CM | POA: Diagnosis not present

## 2021-07-18 NOTE — Progress Notes (Signed)
SITUATION Reason for Visit: Fitting of Diabetic Shoes & Insoles Patient / Caregiver Report:  Patient is satisfied with fit and function  OBJECTIVE DATA: Patient History / Diagnosis:     ICD-10-CM   1. Type 2 diabetes, controlled, with neuropathy ([place])  E11.40     2. Hallux valgus, acquired, right  M20.11     3. Acquired hallux valgus of left foot  M20.12     4. Right peroneal tendinosis  M67.88       Change in Status:   None  ACTIONS PERFORMED: In-Person Delivery, patient was fit with: - 1x pair [id number] PDAC approved prefabricated Diabetic Shoes: Apex U882C 10.5W - 3x pair [id number] PDAC approved CAM milled custom diabetic insoles [employer]: [id number]  Shoes and insoles were verified for structural integrity and safety. Patient wore shoes and insoles in office. Skin was inspected and free of areas of concern after wearing shoes and inserts. Shoes and inserts fit properly. Patient / Caregiver provided with ferbal instruction and demonstration regarding donning, doffing, wear, care, proper fit, function, purpose, cleaning, and use of shoes and insoles ' and in all related precautions and risks and benefits regarding shoes and insoles. Patient / Caregiver was instructed to wear properly fitting socks with shoes at all times. Patient was also provided with verbal instruction regarding how to report any failures or malfunctions of shoes or inserts, and necessary follow up care. Patient / Caregiver was also instructed to contact physician regarding change in status that may affect function of shoes and inserts.   Patient / Caregiver verbalized undersatnding of instruction provided. Patient / Caregiver demonstrated independence with proper donning and doffing of shoes and inserts.  PLAN Patient to follow up as needed. Plan of care was discussed with and agreed upon by patient and/or caregiver. All questions were answered and concerns addressed.

## 2021-08-02 ENCOUNTER — Encounter: Payer: Self-pay | Admitting: Obstetrics & Gynecology

## 2021-08-03 DIAGNOSIS — I340 Nonrheumatic mitral (valve) insufficiency: Secondary | ICD-10-CM | POA: Diagnosis not present

## 2021-08-03 DIAGNOSIS — I10 Essential (primary) hypertension: Secondary | ICD-10-CM | POA: Diagnosis not present

## 2021-08-03 DIAGNOSIS — E1169 Type 2 diabetes mellitus with other specified complication: Secondary | ICD-10-CM | POA: Diagnosis not present

## 2021-08-03 DIAGNOSIS — I480 Paroxysmal atrial fibrillation: Secondary | ICD-10-CM | POA: Diagnosis not present

## 2021-08-03 NOTE — Progress Notes (Signed)
Follow Up Note: Diane [name] ? ?Diane [name] 81 y.o. female ? ?CC: She returns for a f/u visit ? ? ?HPI: The oncology history was reviewed. ? ?Interval History: She denies any new lesions, itching, leg/groin pain, urinary symptoms, leg swelling, weight loss or cough.    ? ?Review of Systems  ?Review of Systems  ?Constitutional:  Negative for malaise/fatigue and weight loss.  ?Respiratory:  Negative for shortness of breath and wheezing.   ?Cardiovascular:  Negative for chest pain and leg swelling.  ?Gastrointestinal:  Negative for abdominal pain, blood in stool, constipation, nausea and vomiting.  ?Genitourinary:  Negative for dysuria, frequency, hematuria and urgency.  ?Musculoskeletal:  Negative for joint pain and myalgias.  ?Neurological:  Negative for weakness.  ?Psychiatric/Behavioral:  Negative for depression. The patient does not have insomnia.   ? ?Current medications, allergy, social history, past surgical history, past medical history, family history were all reviewed. ? ? ? ?Vitals:  BP (!) 137/47 (BP Location: Left Arm, Patient Position: Sitting)   Pulse 68   Temp 97.6 ?F (36.4 ?C) (Oral)   Resp 18   Ht 5' 6.93" (1.7 m)   Wt 213 lb 12.8 oz (97 kg)   SpO2 97%   BMI 33.56 kg/m?  ? ?Physical Exam ?Exam conducted with a chaperone present.  ?Constitutional:   ?   General: She is not in acute distress. ?Cardiovascular:  ?   Rate and Rhythm: Normal rate and regular rhythm.  ?Pulmonary:  ?   Effort: Pulmonary effort is normal.  ?   Breath sounds: Normal breath sounds. No wheezing or rhonchi.  ?Abdominal:  ?   Palpations: Abdomen is soft.  ?   Tenderness: There is no abdominal tenderness. There is no right CVA tenderness or left CVA tenderness.  ?   Hernia: No hernia is present.  ?Genitourinary: ?   General: Normal vulva.  ?   Urethra: No urethral lesion.  ?   Vagina: No lesions. No bleeding ?Musculoskeletal:  ?   Cervical back: Neck supple.  ?   Right lower leg: No edema.  ?   Left lower leg: No  edema.  ?Lymphadenopathy:  ?   Upper Body:  ?   Right upper body: No supraclavicular adenopathy.  ?   Left upper body: No supraclavicular adenopathy.  ?   Lower Body: No right inguinal adenopathy. No left inguinal adenopathy.  ?Skin: ?   Findings: No rash.  ?Neurological:  ?   Mental Status: She is oriented to person, place, and time.  ? ?Assessment/Plan:  ?Vulva cancer ([place]) ? 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  ?Continue close surveillance with repeat exam in 6 months  ? ? ?Lahoma Crocker, MD ?

## 2021-08-03 NOTE — Assessment & Plan Note (Addendum)
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 ?Continue close surveillance with repeat exam in 6 months ?

## 2021-08-04 ENCOUNTER — Inpatient Hospital Stay: Payer: Medicare Other | Attending: Obstetrics & Gynecology | Admitting: Obstetrics & Gynecology

## 2021-08-04 ENCOUNTER — Ambulatory Visit: Payer: Medicare Other | Admitting: Podiatry

## 2021-08-04 ENCOUNTER — Encounter: Payer: Self-pay | Admitting: Obstetrics & Gynecology

## 2021-08-04 ENCOUNTER — Other Ambulatory Visit: Payer: Self-pay

## 2021-08-04 VITALS — BP 137/47 | HR 68 | Temp 97.6°F | Resp 18 | Ht 66.93 in | Wt 213.8 lb

## 2021-08-04 DIAGNOSIS — C519 Malignant neoplasm of vulva, unspecified: Secondary | ICD-10-CM | POA: Insufficient documentation

## 2021-08-04 NOTE — Patient Instructions (Signed)
Return in 6 mos

## 2021-08-12 DIAGNOSIS — F5101 Primary insomnia: Secondary | ICD-10-CM | POA: Diagnosis not present

## 2021-08-12 DIAGNOSIS — I129 Hypertensive chronic kidney disease with stage 1 through stage 4 chronic kidney disease, or unspecified chronic kidney disease: Secondary | ICD-10-CM | POA: Diagnosis not present

## 2021-08-12 DIAGNOSIS — I700 Atherosclerosis of aorta: Secondary | ICD-10-CM | POA: Diagnosis not present

## 2021-08-12 DIAGNOSIS — E7800 Pure hypercholesterolemia, unspecified: Secondary | ICD-10-CM | POA: Diagnosis not present

## 2021-08-12 DIAGNOSIS — I4811 Longstanding persistent atrial fibrillation: Secondary | ICD-10-CM | POA: Diagnosis not present

## 2021-08-12 DIAGNOSIS — N184 Chronic kidney disease, stage 4 (severe): Secondary | ICD-10-CM | POA: Diagnosis not present

## 2021-08-16 DIAGNOSIS — M1812 Unilateral primary osteoarthritis of first carpometacarpal joint, left hand: Secondary | ICD-10-CM | POA: Diagnosis not present

## 2021-08-16 DIAGNOSIS — M19042 Primary osteoarthritis, left hand: Secondary | ICD-10-CM | POA: Diagnosis not present

## 2021-08-19 ENCOUNTER — Inpatient Hospital Stay: Payer: Medicare Other | Admitting: Licensed Clinical Social Worker

## 2021-08-19 ENCOUNTER — Other Ambulatory Visit: Payer: Self-pay

## 2021-08-19 DIAGNOSIS — C519 Malignant neoplasm of vulva, unspecified: Secondary | ICD-10-CM

## 2021-08-19 NOTE — Progress Notes (Signed)
[place] Advance Directives ?Clinical Social Work ? ?Patient presented to [facility]  to review and complete healthcare advance directives.  Clinical Social Worker met with patient.  The patient designated [employer] as their primary healthcare agent and [employer] as their secondary agent.  Patient also completed healthcare living will.   ? ?Documents were notarized and copies made for patient/family. Clinical Social Worker will send documents to medical records to be scanned into patient's chart. ?Clinical Social Worker encouraged patient/family to contact with any additional questions or concerns. ? ? ?Christabelle Hanzlik E Knoxx Boeding, LCSW ?Clinical Social Worker ?[place]      ? ?

## 2021-08-23 ENCOUNTER — Ambulatory Visit (INDEPENDENT_AMBULATORY_CARE_PROVIDER_SITE_OTHER): Payer: Medicare Other | Admitting: Podiatry

## 2021-08-23 ENCOUNTER — Other Ambulatory Visit: Payer: Self-pay

## 2021-08-23 ENCOUNTER — Encounter: Payer: Self-pay | Admitting: Podiatry

## 2021-08-23 DIAGNOSIS — B351 Tinea unguium: Secondary | ICD-10-CM | POA: Diagnosis not present

## 2021-08-23 DIAGNOSIS — M79676 Pain in unspecified toe(s): Secondary | ICD-10-CM | POA: Diagnosis not present

## 2021-08-23 DIAGNOSIS — N183 Chronic kidney disease, stage 3 unspecified: Secondary | ICD-10-CM | POA: Diagnosis not present

## 2021-08-23 DIAGNOSIS — E1140 Type 2 diabetes mellitus with diabetic neuropathy, unspecified: Secondary | ICD-10-CM | POA: Diagnosis not present

## 2021-08-23 NOTE — Progress Notes (Signed)
This patient returns to my office for at risk foot care.  This patient requires this care by a professional since this patient will be at risk due to having chronic kidney disease and diabetes.    This patient is unable to cut nails herself since the patient cannot reach her  nails.These nails are painful walking and wearing shoes.  This patient presents for at risk foot care today.  General Appearance  Alert, conversant and in no acute stress.  Vascular  Dorsalis pedis are palpable  bilaterally.  Posterior tibial pulses  are weakly palpable.Capillary return is within normal limits  bilaterally. Temperature is within normal limits  bilaterally.  Neurologic  Senn-Weinstein monofilament wire test diminished   bilaterally. Muscle power within normal limits bilaterally.  Nails Thick disfigured discolored nails with subungual debris  from hallux to fifth toes bilaterally. No evidence of bacterial infection or drainage bilaterally.  Orthopedic  No limitations of motion  feet .  No crepitus or effusions noted.  No bony pathology or digital deformities noted.  HAV  B/L.    Skin  normotropic skin with no porokeratosis noted bilaterally.  No signs of infections or ulcers noted.     Onychomycosis  Pain in right toes  Pain in left toes  Consent was obtained for treatment procedures.   Mechanical debridement of nails 1-5  bilaterally performed with a nail nipper.  Filed with dremel without incident.     Return office visit   3 months                   Told patient to return for periodic foot care and evaluation due to potential at risk complications.   Jamarco Zaldivar DPM  

## 2021-09-19 DIAGNOSIS — E1169 Type 2 diabetes mellitus with other specified complication: Secondary | ICD-10-CM | POA: Diagnosis not present

## 2021-09-19 DIAGNOSIS — E7800 Pure hypercholesterolemia, unspecified: Secondary | ICD-10-CM | POA: Diagnosis not present

## 2021-09-19 DIAGNOSIS — I129 Hypertensive chronic kidney disease with stage 1 through stage 4 chronic kidney disease, or unspecified chronic kidney disease: Secondary | ICD-10-CM | POA: Diagnosis not present

## 2021-09-19 DIAGNOSIS — N184 Chronic kidney disease, stage 4 (severe): Secondary | ICD-10-CM | POA: Diagnosis not present

## 2021-09-19 DIAGNOSIS — I5022 Chronic systolic (congestive) heart failure: Secondary | ICD-10-CM | POA: Diagnosis not present

## 2021-09-23 ENCOUNTER — Other Ambulatory Visit: Payer: Self-pay | Admitting: Geriatric Medicine

## 2021-09-23 DIAGNOSIS — Z1231 Encounter for screening mammogram for malignant neoplasm of breast: Secondary | ICD-10-CM

## 2021-10-10 DIAGNOSIS — N184 Chronic kidney disease, stage 4 (severe): Secondary | ICD-10-CM | POA: Diagnosis not present

## 2021-10-10 DIAGNOSIS — E559 Vitamin D deficiency, unspecified: Secondary | ICD-10-CM | POA: Diagnosis not present

## 2021-10-11 DIAGNOSIS — N184 Chronic kidney disease, stage 4 (severe): Secondary | ICD-10-CM | POA: Diagnosis not present

## 2021-10-17 DIAGNOSIS — N184 Chronic kidney disease, stage 4 (severe): Secondary | ICD-10-CM | POA: Diagnosis not present

## 2021-10-17 DIAGNOSIS — N281 Cyst of kidney, acquired: Secondary | ICD-10-CM | POA: Diagnosis not present

## 2021-10-17 DIAGNOSIS — N2581 Secondary hyperparathyroidism of renal origin: Secondary | ICD-10-CM | POA: Diagnosis not present

## 2021-10-17 DIAGNOSIS — I129 Hypertensive chronic kidney disease with stage 1 through stage 4 chronic kidney disease, or unspecified chronic kidney disease: Secondary | ICD-10-CM | POA: Diagnosis not present

## 2021-10-17 DIAGNOSIS — D631 Anemia in chronic kidney disease: Secondary | ICD-10-CM | POA: Diagnosis not present

## 2021-10-17 DIAGNOSIS — E1122 Type 2 diabetes mellitus with diabetic chronic kidney disease: Secondary | ICD-10-CM | POA: Diagnosis not present

## 2021-10-17 DIAGNOSIS — R809 Proteinuria, unspecified: Secondary | ICD-10-CM | POA: Diagnosis not present

## 2021-10-31 ENCOUNTER — Ambulatory Visit
Admission: RE | Admit: 2021-10-31 | Discharge: 2021-10-31 | Disposition: A | Discharge status: Home / Self Care | Payer: Medicare Other | Source: Ambulatory Visit | Attending: Geriatric Medicine | Admitting: Geriatric Medicine

## 2021-10-31 DIAGNOSIS — Z1231 Encounter for screening mammogram for malignant neoplasm of breast: Secondary | ICD-10-CM

## 2021-11-16 ENCOUNTER — Ambulatory Visit: Payer: Medicare Other | Admitting: Podiatry

## 2021-11-23 ENCOUNTER — Ambulatory Visit: Payer: Medicare Other | Admitting: Podiatry

## 2021-11-25 DIAGNOSIS — I5022 Chronic systolic (congestive) heart failure: Secondary | ICD-10-CM | POA: Diagnosis not present

## 2021-11-25 DIAGNOSIS — E7800 Pure hypercholesterolemia, unspecified: Secondary | ICD-10-CM | POA: Diagnosis not present

## 2021-11-25 DIAGNOSIS — N184 Chronic kidney disease, stage 4 (severe): Secondary | ICD-10-CM | POA: Diagnosis not present

## 2021-11-25 DIAGNOSIS — I129 Hypertensive chronic kidney disease with stage 1 through stage 4 chronic kidney disease, or unspecified chronic kidney disease: Secondary | ICD-10-CM | POA: Diagnosis not present

## 2021-11-25 DIAGNOSIS — E1121 Type 2 diabetes mellitus with diabetic nephropathy: Secondary | ICD-10-CM | POA: Diagnosis not present

## 2021-12-02 DIAGNOSIS — E119 Type 2 diabetes mellitus without complications: Secondary | ICD-10-CM | POA: Diagnosis not present

## 2021-12-02 DIAGNOSIS — I480 Paroxysmal atrial fibrillation: Secondary | ICD-10-CM | POA: Diagnosis not present

## 2021-12-02 DIAGNOSIS — I340 Nonrheumatic mitral (valve) insufficiency: Secondary | ICD-10-CM | POA: Diagnosis not present

## 2021-12-02 DIAGNOSIS — E785 Hyperlipidemia, unspecified: Secondary | ICD-10-CM | POA: Diagnosis not present

## 2021-12-02 DIAGNOSIS — I10 Essential (primary) hypertension: Secondary | ICD-10-CM | POA: Diagnosis not present

## 2021-12-14 ENCOUNTER — Encounter: Payer: Self-pay | Admitting: Podiatry

## 2021-12-14 ENCOUNTER — Ambulatory Visit: Payer: Medicare Other | Admitting: Podiatry

## 2021-12-14 DIAGNOSIS — E1140 Type 2 diabetes mellitus with diabetic neuropathy, unspecified: Secondary | ICD-10-CM

## 2021-12-14 DIAGNOSIS — N183 Chronic kidney disease, stage 3 unspecified: Secondary | ICD-10-CM | POA: Diagnosis not present

## 2021-12-14 DIAGNOSIS — B351 Tinea unguium: Secondary | ICD-10-CM

## 2021-12-14 DIAGNOSIS — M79676 Pain in unspecified toe(s): Secondary | ICD-10-CM | POA: Diagnosis not present

## 2021-12-14 NOTE — Progress Notes (Signed)
This patient returns to my office for at risk foot care.  This patient requires this care by a professional since this patient will be at risk due to having chronic kidney disease and diabetes.    This patient is unable to cut nails herself since the patient cannot reach her  nails.These nails are painful walking and wearing shoes.  This patient presents for at risk foot care today.  General Appearance  Alert, conversant and in no acute stress.  Vascular  Dorsalis pedis are palpable  bilaterally.  Posterior tibial pulses  are weakly palpable.Capillary return is within normal limits  bilaterally. Temperature is within normal limits  bilaterally.  Neurologic  Senn-Weinstein monofilament wire test diminished   bilaterally. Muscle power within normal limits bilaterally.  Nails Thick disfigured discolored nails with subungual debris  from hallux to fifth toes bilaterally. No evidence of bacterial infection or drainage bilaterally.  Orthopedic  No limitations of motion  feet .  No crepitus or effusions noted.  No bony pathology or digital deformities noted.  HAV  B/L.    Skin  normotropic skin with no porokeratosis noted bilaterally.  No signs of infections or ulcers noted.     Onychomycosis  Pain in right toes  Pain in left toes  Consent was obtained for treatment procedures.   Mechanical debridement of nails 1-5  bilaterally performed with a nail nipper.  Filed with dremel without incident.     Return office visit   3 months                   Told patient to return for periodic foot care and evaluation due to potential at risk complications.   [name] DPM  

## 2021-12-16 DIAGNOSIS — E1169 Type 2 diabetes mellitus with other specified complication: Secondary | ICD-10-CM | POA: Diagnosis not present

## 2021-12-16 DIAGNOSIS — I4819 Other persistent atrial fibrillation: Secondary | ICD-10-CM | POA: Diagnosis not present

## 2021-12-16 DIAGNOSIS — I5022 Chronic systolic (congestive) heart failure: Secondary | ICD-10-CM | POA: Diagnosis not present

## 2021-12-16 DIAGNOSIS — N184 Chronic kidney disease, stage 4 (severe): Secondary | ICD-10-CM | POA: Diagnosis not present

## 2021-12-16 DIAGNOSIS — M542 Cervicalgia: Secondary | ICD-10-CM | POA: Diagnosis not present

## 2021-12-16 DIAGNOSIS — I129 Hypertensive chronic kidney disease with stage 1 through stage 4 chronic kidney disease, or unspecified chronic kidney disease: Secondary | ICD-10-CM | POA: Diagnosis not present

## 2021-12-19 DIAGNOSIS — Z961 Presence of intraocular lens: Secondary | ICD-10-CM | POA: Diagnosis not present

## 2021-12-19 DIAGNOSIS — E119 Type 2 diabetes mellitus without complications: Secondary | ICD-10-CM | POA: Diagnosis not present

## 2021-12-19 DIAGNOSIS — Z7984 Long term (current) use of oral hypoglycemic drugs: Secondary | ICD-10-CM | POA: Diagnosis not present

## 2021-12-19 DIAGNOSIS — H5203 Hypermetropia, bilateral: Secondary | ICD-10-CM | POA: Diagnosis not present

## 2021-12-19 DIAGNOSIS — H524 Presbyopia: Secondary | ICD-10-CM | POA: Diagnosis not present

## 2021-12-20 DIAGNOSIS — E7800 Pure hypercholesterolemia, unspecified: Secondary | ICD-10-CM | POA: Diagnosis not present

## 2021-12-20 DIAGNOSIS — E1169 Type 2 diabetes mellitus with other specified complication: Secondary | ICD-10-CM | POA: Diagnosis not present

## 2021-12-20 DIAGNOSIS — N184 Chronic kidney disease, stage 4 (severe): Secondary | ICD-10-CM | POA: Diagnosis not present

## 2021-12-20 DIAGNOSIS — I5022 Chronic systolic (congestive) heart failure: Secondary | ICD-10-CM | POA: Diagnosis not present

## 2022-01-09 DIAGNOSIS — R519 Headache, unspecified: Secondary | ICD-10-CM | POA: Diagnosis not present

## 2022-01-10 DIAGNOSIS — E7800 Pure hypercholesterolemia, unspecified: Secondary | ICD-10-CM | POA: Diagnosis not present

## 2022-01-10 DIAGNOSIS — I5022 Chronic systolic (congestive) heart failure: Secondary | ICD-10-CM | POA: Diagnosis not present

## 2022-01-10 DIAGNOSIS — I129 Hypertensive chronic kidney disease with stage 1 through stage 4 chronic kidney disease, or unspecified chronic kidney disease: Secondary | ICD-10-CM | POA: Diagnosis not present

## 2022-01-10 DIAGNOSIS — E1169 Type 2 diabetes mellitus with other specified complication: Secondary | ICD-10-CM | POA: Diagnosis not present

## 2022-01-10 DIAGNOSIS — N184 Chronic kidney disease, stage 4 (severe): Secondary | ICD-10-CM | POA: Diagnosis not present

## 2022-01-17 NOTE — Progress Notes (Unsigned)
Synopsis: Referred for dyspnea by Lajean Manes, MD  Subjective:   PATIENT ID: Diane [name] GENDER: female DOB: 02-11-1941, MRN: [id number]  No chief complaint on file.  81yF history of AFs/p maze, breast cancer and SCC vulva, chronic diastolic heart failure, CAD, DM, MR s/p ring annuloplasty, PH/TR, emphysema, COPD, smoking  Otherwise pertinent review of systems is negative.  Past Medical History:  Diagnosis Date   Anxiety    Arthritis    back   Atrial fibrillation ([place])    Breast cancer ([place])    right breast cancer - 3 years ago   Chronic diastolic heart failure (H[facility])    ECHO: 06/20/2017 - Grade 2 Diastolic Dysfunction    Coronary artery disease    Depression    Diabetes mellitus without complication ([facility])    diet   Dysrhythmia    afib   Fibromyalgia    Herniated disc    Hyperlipemia    Hypertension    Iron deficiency anemia    Longstanding persistent atrial fibrillation ([facility])    Lower back pain    Lumbar stenosis    L4-5   Personal history of chemotherapy    Personal history of radiation therapy    Pulmonary hypertension ([place])    S/P Maze operation for atrial fibrillation 05/09/2018   Complete bilateral atrial lesion set using bipolar radiofrequency and cryothermy ablation with clipping of LA appendage   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   Docetaxel/Cytoxan Q 3 Weeks x 4 cycles   Torn rotator cuff    Tricuspid regurgitation      Family History  Problem Relation Age of Onset   Stroke Mother    Alzheimer's disease Mother    Heart  attack Father    Heart attack Brother    Breast cancer Neg Hx      Past Surgical History:  Procedure Laterality Date   ABDOMINAL HYSTERECTOMY     BREAST BIOPSY Right 09/04/2012   BREAST LUMPECTOMY Right 10/08/2012   CLIPPING OF ATRIAL APPENDAGE N/A 05/09/2018   Procedure: CLIPPING OF ATRIAL APPENDAGE USING ATRICLIP PRO2 CLIP SIZE 45MM;  Surgeon: Rexene Alberts, MD;  Location: [facility];  Service: Open Heart Surgery;  Laterality: N/A;   COLONOSCOPY     DILATION AND CURETTAGE OF UTERUS     LEFT HEART CATHETERIZATION WITH CORONARY ANGIOGRAM N/A 09/14/2014   Procedure: LEFT HEART CATHETERIZATION WITH CORONARY ANGIOGRAM;  Surgeon: [name], MD;  Location: [facility] CATH LAB;  Service: Cardiovascular;  Laterality: N/A;   MAZE N/A 05/09/2018   Procedure: MAZE;  Surgeon: Rexene Alberts, MD;  Location: [facility];  Service: Open Heart Surgery;  Laterality: N/A;   MITRAL VALVE REPAIR N/A 05/09/2018   Procedure: MITRAL VALVE REPAIR (MVR) USING MEMO 4D RING SIZE 26MM;  Surgeon: Rexene Alberts, MD;  Location: [facility];  Service: Open Heart Surgery;  Laterality: N/A;   OPEN REDUCTION INTERNAL FIXATION (ORIF) PROXIMAL PHALANX Right 01/04/2016   Procedure: OPEN TREATMENT OF RIGHT THUMB PROXIMAL PHALANX FRACTURE;  Surgeon: Milly Jakob, MD;  Location: [facility];  Service: Orthopedics;  Laterality: Right;   PARTIAL MASTECTOMY WITH NEEDLE LOCALIZATION AND AXILLARY SENTINEL LYMPH NODE BX Right 10/08/2012   Procedure: RIGHT PARTIAL MASTECTOMY WITH NEEDLE LOCALIZATION AND RIGHT AXILLARY SENTINEL LYMPH NODE BX;  Surgeon: Adin Hector, MD;  Location: [facility];  Service: General;  Laterality: Right;  Injection of 1% Methylene Blue dye into right breast   PORTACATH PLACEMENT Left 10/08/2012   Procedure: INSERTION PORT-A-CATH WITH ULTRASOUND GUIDANCE;  Surgeon: Adin Hector, MD;  Location: [facility];  Service: General;  Laterality: Left;  Using 15F Powerport [place]   Posterior Lumbar Arthrodesis, Pedicle screw  fixation, Posterolateral Arthodesis  03/17/11   RIGHT/LEFT HEART CATH AND CORONARY ANGIOGRAPHY N/A 05/02/2018   Procedure: RIGHT/LEFT HEART CATH AND CORONARY ANGIOGRAPHY;  Surgeon: [name], MD;  Location: [facility];  Service: Cardiovascular;  Laterality: N/A;   SHOULDER ARTHROSCOPY WITH ROTATOR CUFF REPAIR AND SUBACROMIAL DECOMPRESSION  06/04/2012   Procedure: SHOULDER ARTHROSCOPY WITH ROTATOR CUFF REPAIR AND SUBACROMIAL DECOMPRESSION;  Surgeon: Nita Sells, MD;  Location: [facility];  Service: Orthopedics;  Laterality: Left;  LEFT SHOULDER ARTHROSCOPY WITH ROTATOR CUFF REPAIR AND SUBACROMIAL DECOMPRESSION AND DISTAL CLAVICLE RESECTION   TEE WITHOUT CARDIOVERSION N/A 05/03/2018   Procedure: TRANSESOPHAGEAL ECHOCARDIOGRAM (TEE);  Surgeon: Dixie Dials, MD;  Location: [facility] ENDOSCOPY;  Service: Cardiovascular;  Laterality: N/A;   TEE WITHOUT CARDIOVERSION N/A 05/09/2018   Procedure: TRANSESOPHAGEAL ECHOCARDIOGRAM (TEE);  Surgeon: Rexene Alberts, MD;  Location: [facility];  Service: Open Heart Surgery;  Laterality: N/A;   TONSILLECTOMY     TRICUSPID VALVE REPLACEMENT N/A 05/09/2018   Procedure: TRICUSPID VALVE REPAIR USING MC3 RING SIZE 28MM;  Surgeon: Rexene Alberts, MD;  Location: [facility];  Service: Open Heart Surgery;  Laterality: N/A;   TUBAL LIGATION     VULVECTOMY Left 02/06/2019   Procedure: POSTERIOR RADICAL VULVECTOMY;  Surgeon: Everitt Amber, MD;  Location: [facility];  Service: Gynecology;  Laterality: Left;   VULVECTOMY PARTIAL     Wide Local Excision  01/2010   Bilateral groin excisions, re-excision of vulva    Social History   Socioeconomic History   Marital status: Widowed    Spouse name: Not on file   Number of children: Not on file   Years of education: Not on file   Highest education level: Not on file  Occupational History   Occupation: retired  Tobacco Use   Smoking status: Former    Years: 50.00    Types: Cigarettes    Quit date: 05/29/2009     Years since quitting: 12.6   Smokeless tobacco: Never  Vaping Use   Vaping Use: Never used  Substance and Sexual Activity   Alcohol use: Yes    Comment: rare   Drug use: No   Sexual activity: Not Currently  Other Topics Concern   Not on file  Social History Narrative   Tobacco use cigarettes: former smoker, quit in year Sept 2011, Pack-year Hx: 60, tobacco history last updated 10/10/2013. No smoking. Per patient stopped smoking 9 months ago. No alcohol. Caffeine.: yes, 2+ servings daily, tea. No recreational drug use. Exercise: [employer] water aer[employer]. Marital status: widow.   Social Determinants of Health   Financial Resource Strain: Not on file  Food Insecurity: Not on file  Transportation Needs: Not on file  Physical Activity: Not on file  Stress: Not on file  Social Connections: Not on file  Intimate Partner  Violence: Not on file     Allergies  Allergen Reactions   Shellfish Allergy Anaphylaxis and Swelling    Other reaction(s): Other (See Comments)   Shellfish-Derived Products Other (See Comments)     Outpatient Medications Prior to Visit  Medication Sig Dispense Refill   acetaminophen (TYLENOL) 500 MG tablet Take 500 mg by mouth every 6 (six) hours as needed (for pain.).     albuterol (VENTOLIN HFA) 108 (90 Base) MCG/ACT inhaler Inhale 2 puffs into the lungs every 6 (six) hours as needed for wheezing or shortness of breath.     amiodarone (PACERONE) 200 MG tablet Take 100 mg by mouth every other day.     amoxicillin (AMOXIL) 500 MG capsule Take 2,000 mg by mouth as needed (dental appointment).     aspirin EC 81 MG tablet Take 81 mg by mouth daily.     busPIRone (BUSPAR) 5 MG tablet Take 5 mg by mouth 2 (two) times daily.     Camphor-Menthol-Methyl Sal (SALONPAS EX) Apply 1 patch topically every 8 (eight) hours as needed (pain).     co-enzyme Q-10 30 MG capsule Take 30 mg by mouth daily.     ELIQUIS 2.5 MG TABS tablet Take 2.5 mg by mouth 2 (two) times daily.      ferrous sulfate 325 (65 FE) MG EC tablet Take 325 mg by mouth once a week. Thursday's     FLUoxetine (PROZAC) 10 MG capsule Take 10 mg by mouth daily.     furosemide (LASIX) 40 MG tablet Take 1 tablet (40 mg total) by mouth daily. 30 tablet 1   gabapentin (NEURONTIN) 300 MG capsule Take 300 mg by mouth at bedtime.      ketoconazole (NIZORAL) 2 % cream Apply topically daily.     Lancets (ONETOUCH DELICA PLUS [id number]) MISC USE AS DIRECTED DAILY FOR 30 DAYS     loratadine (CLARITIN) 10 MG tablet Take 10 mg by mouth daily as needed for allergies.     Multiple Vitamins-Minerals (CENTRUM SILVER 50+WOMEN) TABS Take 1 tablet by mouth daily.     nitroGLYCERIN (NITROSTAT) 0.4 MG SL tablet Place 0.4 mg under the tongue every 5 (five) minutes x 3 doses as needed for chest pain.     ONETOUCH VERIO test strip FOR USE WHEN CHECKING BLOOD GLUCOSE ONCE DAILY ALTERNATING AM AND PM BEFORE MEALS FINGER STICK 90 DAYS  3   pravastatin (PRAVACHOL) 10 MG tablet Take 10 mg by mouth at bedtime.     spironolactone (ALDACTONE) 25 MG tablet Take 12.5 mg by mouth every morning.     No facility-administered medications prior to visit.       Objective:   Physical Exam:  General appearance: 81 y.o., female, NAD, conversant  Eyes: anicteric sclerae; PERRL, tracking appropriately HENT: NCAT; MMM Neck: Trachea midline; no lymphadenopathy, no JVD Lungs: CTAB, no crackles, no wheeze, with normal respiratory effort CV: RRR, no murmur  Abdomen: Soft, non-tender; non-distended, BS present  Extremities: No peripheral edema, warm Skin: Normal turgor and texture; no rash Psych: Appropriate affect Neuro: Alert and oriented to person and place, no focal deficit     There were no vitals filed for this visit.   on *** LPM *** RA BMI Readings from Last 3 Encounters:  08/04/21 33.56 kg/m  02/07/21 31.87 kg/m  09/21/20 33.61 kg/m   Wt Readings from Last 3 Encounters:  08/04/21 213 lb 12.8 oz (97 kg)  02/07/21 203 lb  8 oz (92.3 kg)  09/21/20 214  lb 9.6 oz (97.3 kg)     CBC    Component Value Date/Time   WBC 7.9 02/19/2021 1542   RBC 4.23 02/19/2021 1542   HGB 11.4 (L) 02/19/2021 1542   HGB 10.6 (L) 01/11/2015 1054   HCT 36.8 02/19/2021 1542   HCT 33.1 (L) 01/11/2015 1054   PLT 183 02/19/2021 1542   PLT 207 01/11/2015 1054   MCV 87.0 02/19/2021 1542   MCV 84.1 01/11/2015 1054   MCH 27.0 02/19/2021 1542   MCHC 31.0 02/19/2021 1542   RDW 15.7 (H) 02/19/2021 1542   RDW 16.0 (H) 01/11/2015 1054   LYMPHSABS 2.8 02/19/2021 1542   LYMPHSABS 1.4 01/11/2015 1054   MONOABS 0.7 02/19/2021 1542   MONOABS 0.5 01/11/2015 1054   EOSABS 0.1 02/19/2021 1542   EOSABS 0.1 01/11/2015 1054   BASOSABS 0.0 02/19/2021 1542   BASOSABS 0.1 01/11/2015 1054    ***  Chest Imaging: CXR 02/19/21 reviewed by me with prominent vasculature, cardiomegaly  CTA Dissection protocol 05/06/18 reviewed by me with mosaic attenuation, cardiomegaly, small stable right effusion  Pulmonary Functions Testing Results:    Latest Ref Rng & Units 02/06/2018   11:44 AM  PFT Results  FVC-Pre L 1.26   FVC-Predicted Pre % 51   FVC-Post L 1.32   FVC-Predicted Post % 54   Pre FEV1/FVC % % 83   Post FEV1/FCV % % 87   FEV1-Pre L 1.05   FEV1-Predicted Pre % 55   FEV1-Post L 1.15   DLCO uncorrected ml/min/mmHg 11.02   DLCO UNC% % 40   DLCO corrected ml/min/mmHg 11.96   DLCO COR %Predicted % 44   DLVA Predicted % 77   TLC L 4.11   TLC % Predicted % 76   RV % Predicted % 95    Mixed moderate obstruction (FEV1/SVC 57, FEV1 post BD 61) and restriction, severely reduced diffusing capacity    Echocardiogram:   TTE 05/16/18:  - Left ventricle: The cavity size was normal. Systolic function was    normal. The estimated ejection fraction was in the range of 55%    to 60%. Wall motion was normal; there were no regional wall    motion abnormalities. Doppler parameters are consistent with    abnormal left ventricular relaxation  (grade 1 diastolic    dysfunction).  - Aortic valve: There was mild to moderate regurgitation. Valve    area (VTI): 2.11 cm^2. Valve area (Vmax): 1.99 cm^2. Valve area    (Vmean): 2.15 cm^2.  - Mitral valve: Valve area by continuity equation (using LVOT    flow): 1.91 cm^2.  - Left atrium: The atrium was mildly dilated.  - Atrial septum: No defect or patent foramen ovale was identified.  - Pulmonic valve: Peak gradient (S): 12 mm Hg.  Heart Catheterization: ***    Assessment & Plan:    Plan:      Maryjane Hurter, MD [facility] 01/17/2022 6:02 PM

## 2022-01-19 ENCOUNTER — Ambulatory Visit (INDEPENDENT_AMBULATORY_CARE_PROVIDER_SITE_OTHER): Payer: Medicare Other | Admitting: Student

## 2022-01-19 ENCOUNTER — Encounter: Payer: Self-pay | Admitting: Student

## 2022-01-19 VITALS — BP 128/80 | HR 60 | Ht 67.5 in | Wt 206.8 lb

## 2022-01-19 DIAGNOSIS — J449 Chronic obstructive pulmonary disease, unspecified: Secondary | ICD-10-CM | POA: Diagnosis not present

## 2022-01-19 DIAGNOSIS — R490 Dysphonia: Secondary | ICD-10-CM | POA: Diagnosis not present

## 2022-01-19 MED ORDER — STIOLTO RESPIMAT 2.5-2.5 MCG/ACT IN AERS: 2.0000 | INHALATION_SPRAY | Freq: Every day | RESPIRATORY_TRACT | 0 refills | Status: DC

## 2022-01-19 MED ORDER — PANTOPRAZOLE SODIUM 40 MG PO TBEC: 40.0000 mg | DELAYED_RELEASE_TABLET | Freq: Every day | ORAL | 0 refills | Status: DC

## 2022-01-19 NOTE — Patient Instructions (Addendum)
- start protonix 40 mg 30 minutes before dinner EVERY night - start stiolto 2 puffs daily - albuterol as needed - see conservative measures below to limit throat clearing  - ENT referral placed today - if feeling 100% better after above measures then can cancel appointment    You are a chronic throat clearer! You are not alone! The causes of chronic throat clearing include acid reflux (laryngopharyngeal reflux), allergies, environmental irritants such as tobacco smoke and air pollution, and asthma. If present for a long time throat clearing can become habit forming. When you clear your throat, you are transferring mucus from your throat up into your mouth and nose. We all secrete up to 2 liters (imagine a big Coke bottle) of mucus a day. This saliva is usually swallowed and ends up in the toilet eventually. By clearing the mucus back into your mouth and nose you are sending the saliva in the wrong direction. This is counterproductive. Unless you are walking around spitting all day (which most throat clearers do not do), the mucus will work its way back down to the throat and eventually be swallowed. Get the mucus going in the right direction. Swallow! Swallow! Swallow! No throat clearing.  Chronic throat clearing is damaging. The trauma from the throat clearing can cause redness and swelling of your vocal cords. If the clearing is very excessive small growths (granulomas) can form. These granulomas can get so large that they can eventually affect your breathing. Surgical removal may be necessary. The irritation and swelling produced by the clearing can cause saliva to sit in your throat. This causes more throat clearing. More throat clearing causes more stagnant mucus which causes more throat clearing, which causes more mucus, etc. A vicious cycle will ensue and the habit can be very difficult to break. Without your help and a conscious effort on your part to break the cycle, the throat clearing will never  stop.  Your doctor may prescribe medication and behavioral modifications to treat acid reflux disease. Nose and throat sprays may be prescribed to treat underlying allergies or asthma. Avoiding possible irritants will be recommended. Without changes to your behavior these treatments will not be successful. The following alterations are recommended:  Do not clear your throat. Swallow instead. This gets the mucus going in the right direction towards the toilet.  Carry around some water to assist with swallowing and mucus clearance. When you feel the urge to clear your throat take a sip of the water. If you absolutely need to clear your throat perform a non-traumatic throat clear. To do this pant with your mouth open and say "[place], [place], [place]" with a powerful but very breathy voice. This will clear the secretions without causing damage. Increase your water intake. This will thin secretions and make it easier to swallow. Comply with the behavior recommendations for reflux disease. Chew baking soda  gum. This can be found on the internet or in the tooth paste isle of your pharmacy. Gum chewing can help with swallowing, reflux, and throat clearing. Chew three pieces a day. If you develop jaw discomfort or headaches decrease the amount of gum chewing. Tell your friends and family to tell you to swallow when you clear your throat. Some people have been clearing so long that they don't even know when they are doing it. Be patient. The urge to clear your throat will not go away overnight. It may take 8 or 12 weeks for the medication and behavior modifications to work.

## 2022-01-31 ENCOUNTER — Encounter: Payer: Self-pay | Admitting: Obstetrics & Gynecology

## 2022-01-31 NOTE — Assessment & Plan Note (Signed)
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

## 2022-01-31 NOTE — Progress Notes (Unsigned)
Follow Up Note: Gyn-Onc  Diane [name] 81 y.o. female  CC: She returns for a f/u visit   HPI: The oncology history was reviewed.  Interval History: She denies any new lesions, itching, leg/groin pain, urinary symptoms, leg swelling, weight loss or cough.   C/O rare vaginal irritation, malodor.  No abnormal discharge. H/O DM--adequate glycemic control per the pt.     Review of Systems  Review of Systems  Constitutional:  Negative for malaise/fatigue and weight loss.  Respiratory:  Negative for shortness of breath and wheezing.   Cardiovascular:  Negative for chest pain and leg swelling.  Gastrointestinal:  Negative for abdominal pain, blood in stool, constipation, nausea and vomiting.  Genitourinary:  Negative for dysuria, frequency, hematuria and urgency.  Musculoskeletal:  Negative for joint pain and myalgias.  Neurological:  Negative for weakness.  Psychiatric/Behavioral:  Negative for depression. The patient does not have insomnia.    Current medications, allergy, social history, past surgical history, past medical history, family history were all reviewed.    Vitals:  BP (!) 153/75 (BP Location: Right Arm, Patient Position: Sitting)   Pulse 64   Temp 98.5 F (36.9 C) (Oral)   Resp 18   Wt 203 lb (92.1 kg)   SpO2 93%   BMI 31.33 kg/m    Physical Exam Exam conducted with a chaperone present.  Constitutional:      General: She is not in acute distress. Cardiovascular:     Rate and Rhythm: Normal rate and regular rhythm.  Pulmonary:     Effort: Pulmonary effort is normal.     Breath sounds: Normal breath sounds. No wheezing or rhonchi.  Abdominal:     Palpations: Abdomen is soft.     Tenderness: There is no abdominal tenderness. There is no right CVA tenderness or left CVA tenderness.     Hernia: No hernia is present.  Genitourinary:    General: Normal vulva.     Urethra: No urethral lesion.     Vagina: No lesions. No bleeding.  Scant, thick, yellow  discharge Musculoskeletal:     Cervical back: Neck supple.     Right lower leg: No edema.     Left lower leg: No edema.  Lymphadenopathy:     Upper Body:     Right upper body: No supraclavicular adenopathy.     Left upper body: No supraclavicular adenopathy.     Lower Body: No right inguinal adenopathy. No left inguinal adenopathy.  Skin:    Findings: No rash.  Neurological:     Mental Status: She is oriented to person, place, and time.   Assessment/Plan:  No problem-specific Assessment & Plan notes found for this encounter.  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    Lahoma Crocker, MD

## 2022-02-01 ENCOUNTER — Encounter: Payer: Self-pay | Admitting: Obstetrics & Gynecology

## 2022-02-01 ENCOUNTER — Other Ambulatory Visit: Payer: Self-pay

## 2022-02-01 ENCOUNTER — Inpatient Hospital Stay: Payer: Medicare Other | Attending: Obstetrics & Gynecology | Admitting: Obstetrics & Gynecology

## 2022-02-01 VITALS — BP 153/75 | HR 64 | Temp 98.5°F | Resp 18 | Wt 203.0 lb

## 2022-02-01 DIAGNOSIS — C519 Malignant neoplasm of vulva, unspecified: Secondary | ICD-10-CM

## 2022-02-01 DIAGNOSIS — Z8544 Personal history of malignant neoplasm of other female genital organs: Secondary | ICD-10-CM | POA: Diagnosis present

## 2022-02-01 NOTE — Patient Instructions (Signed)
Return in 6 months  Vulvar/Vaginal Moisturizers  Moisturizer Options: Vitamin E oil: pump or capsule form Vitamin E cream (Gene's vitamin E cream) Coconut oil: bottle or bead form Shea butter Blossom Organic Lubricant (organic and all natural; www.blossomorganics.com) PE suppository(coconut oil/vitamin E/palm oil) [employer]vest Aloe Glide      Consider the ingredients of the product - the fewer the ingredients the better!  Directions for Use: Clean and dry your hands Gently dab the vulvar/vaginal area dry as needed Apply a "pea-sized" amount of the moisturizer onto your fingertip Using you other hand, open the labia   Apply the moisturizer to the vulvar/vaginal tissues Wear loose fitting underwear/clothing if possible following application  Use moisturize 2-3 times daily as desired. Healthy vulval hygiene practices Avoid Substitute  Clothing  Pantyhose Stockings with a garter belt Thigh-high or knee-high stockings   Synthetic underwear Cotton underwear or no underwear  Jeans and other tight pants Loose pants, skirts, dresses  Swimsuits, leotards, thongs, lycra garments Loose-fitting cotton garments  Cleansing products  Scented soaps or shampoos Fragrance-free pH neutral soap  Bubble bath Tub baths in the morning and at night without additives and at a comfortable temperature  Scented detergents Unscented detergents  Baby wipes or flushable wipes Rinse with water using sports water bottle or perineal irrigation bottle  Feminine sprays, douches, powders These are not necessary products and can be omitted from personal practices  Other  Washcloths Use fingertips for washing; pat dry, do not rub dry  Panty liners Tampons or cotton pads  Dyed toilet articles Toilet articles without dyes  Hair dryers to dry vulva skin without contact Dry vulva by gentle patting

## 2022-02-13 DIAGNOSIS — N184 Chronic kidney disease, stage 4 (severe): Secondary | ICD-10-CM | POA: Diagnosis not present

## 2022-02-15 DIAGNOSIS — E7800 Pure hypercholesterolemia, unspecified: Secondary | ICD-10-CM | POA: Diagnosis not present

## 2022-02-15 DIAGNOSIS — I5022 Chronic systolic (congestive) heart failure: Secondary | ICD-10-CM | POA: Diagnosis not present

## 2022-02-15 DIAGNOSIS — N184 Chronic kidney disease, stage 4 (severe): Secondary | ICD-10-CM | POA: Diagnosis not present

## 2022-02-15 DIAGNOSIS — I129 Hypertensive chronic kidney disease with stage 1 through stage 4 chronic kidney disease, or unspecified chronic kidney disease: Secondary | ICD-10-CM | POA: Diagnosis not present

## 2022-02-15 DIAGNOSIS — E1169 Type 2 diabetes mellitus with other specified complication: Secondary | ICD-10-CM | POA: Diagnosis not present

## 2022-02-20 DIAGNOSIS — D631 Anemia in chronic kidney disease: Secondary | ICD-10-CM | POA: Diagnosis not present

## 2022-02-20 DIAGNOSIS — E1122 Type 2 diabetes mellitus with diabetic chronic kidney disease: Secondary | ICD-10-CM | POA: Diagnosis not present

## 2022-02-20 DIAGNOSIS — N2581 Secondary hyperparathyroidism of renal origin: Secondary | ICD-10-CM | POA: Diagnosis not present

## 2022-02-20 DIAGNOSIS — I129 Hypertensive chronic kidney disease with stage 1 through stage 4 chronic kidney disease, or unspecified chronic kidney disease: Secondary | ICD-10-CM | POA: Diagnosis not present

## 2022-02-20 DIAGNOSIS — N184 Chronic kidney disease, stage 4 (severe): Secondary | ICD-10-CM | POA: Diagnosis not present

## 2022-02-21 ENCOUNTER — Ambulatory Visit: Payer: Medicare Other | Admitting: Podiatry

## 2022-03-03 DIAGNOSIS — I480 Paroxysmal atrial fibrillation: Secondary | ICD-10-CM | POA: Diagnosis not present

## 2022-03-03 DIAGNOSIS — E785 Hyperlipidemia, unspecified: Secondary | ICD-10-CM | POA: Diagnosis not present

## 2022-03-03 DIAGNOSIS — E119 Type 2 diabetes mellitus without complications: Secondary | ICD-10-CM | POA: Diagnosis not present

## 2022-03-03 DIAGNOSIS — I10 Essential (primary) hypertension: Secondary | ICD-10-CM | POA: Diagnosis not present

## 2022-03-03 DIAGNOSIS — I340 Nonrheumatic mitral (valve) insufficiency: Secondary | ICD-10-CM | POA: Diagnosis not present

## 2022-03-16 DIAGNOSIS — E1169 Type 2 diabetes mellitus with other specified complication: Secondary | ICD-10-CM | POA: Diagnosis not present

## 2022-03-16 DIAGNOSIS — N184 Chronic kidney disease, stage 4 (severe): Secondary | ICD-10-CM | POA: Diagnosis not present

## 2022-03-16 DIAGNOSIS — I129 Hypertensive chronic kidney disease with stage 1 through stage 4 chronic kidney disease, or unspecified chronic kidney disease: Secondary | ICD-10-CM | POA: Diagnosis not present

## 2022-03-16 DIAGNOSIS — I5022 Chronic systolic (congestive) heart failure: Secondary | ICD-10-CM | POA: Diagnosis not present

## 2022-03-16 DIAGNOSIS — E7800 Pure hypercholesterolemia, unspecified: Secondary | ICD-10-CM | POA: Diagnosis not present

## 2022-03-22 ENCOUNTER — Ambulatory Visit: Payer: Medicare Other | Admitting: Podiatry

## 2022-03-22 ENCOUNTER — Encounter: Payer: Self-pay | Admitting: Podiatry

## 2022-03-22 DIAGNOSIS — B351 Tinea unguium: Secondary | ICD-10-CM

## 2022-03-22 DIAGNOSIS — N183 Chronic kidney disease, stage 3 unspecified: Secondary | ICD-10-CM

## 2022-03-22 DIAGNOSIS — M79676 Pain in unspecified toe(s): Secondary | ICD-10-CM | POA: Diagnosis not present

## 2022-03-22 DIAGNOSIS — E1140 Type 2 diabetes mellitus with diabetic neuropathy, unspecified: Secondary | ICD-10-CM | POA: Diagnosis not present

## 2022-03-22 DIAGNOSIS — M2011 Hallux valgus (acquired), right foot: Secondary | ICD-10-CM

## 2022-03-22 DIAGNOSIS — M2012 Hallux valgus (acquired), left foot: Secondary | ICD-10-CM

## 2022-03-22 NOTE — Progress Notes (Signed)
This patient returns to my office for at risk foot care.  This patient requires this care by a professional since this patient will be at risk due to having chronic kidney disease and diabetes.    This patient is unable to cut nails herself since the patient cannot reach her  nails.These nails are painful walking and wearing shoes.  This patient presents for at risk foot care today.  General Appearance  Alert, conversant and in no acute stress.  Vascular  Dorsalis pedis are palpable  bilaterally.  Posterior tibial pulses  are weakly palpable.Capillary return is within normal limits  bilaterally. Temperature is within normal limits  bilaterally.  Neurologic  Senn-Weinstein monofilament wire test diminished   bilaterally. Muscle power within normal limits bilaterally.  Nails Thick disfigured discolored nails with subungual debris  from hallux to fifth toes bilaterally. No evidence of bacterial infection or drainage bilaterally.  Orthopedic  No limitations of motion  feet .  No crepitus or effusions noted.  No bony pathology or digital deformities noted.  HAV  B/L.    Skin  normotropic skin with no porokeratosis noted bilaterally.  No signs of infections or ulcers noted.     Onychomycosis  Pain in right toes  Pain in left toes  Consent was obtained for treatment procedures.   Mechanical debridement of nails 1-5  bilaterally performed with a nail nipper.  Filed with dremel without incident.     Return office visit   3 months                   Told patient to return for periodic foot care and evaluation due to potential at risk complications.   Lilymae Swiech DPM  

## 2022-04-13 DIAGNOSIS — I129 Hypertensive chronic kidney disease with stage 1 through stage 4 chronic kidney disease, or unspecified chronic kidney disease: Secondary | ICD-10-CM | POA: Diagnosis not present

## 2022-04-13 DIAGNOSIS — E7800 Pure hypercholesterolemia, unspecified: Secondary | ICD-10-CM | POA: Diagnosis not present

## 2022-04-13 DIAGNOSIS — I5022 Chronic systolic (congestive) heart failure: Secondary | ICD-10-CM | POA: Diagnosis not present

## 2022-04-13 DIAGNOSIS — E1169 Type 2 diabetes mellitus with other specified complication: Secondary | ICD-10-CM | POA: Diagnosis not present

## 2022-04-13 DIAGNOSIS — N184 Chronic kidney disease, stage 4 (severe): Secondary | ICD-10-CM | POA: Diagnosis not present

## 2022-04-17 NOTE — Progress Notes (Unsigned)
Synopsis: Referred for dyspnea by Lajean Manes, MD  Subjective:   PATIENT ID: Diane [name] GENDER: female DOB: 03-Nov-1940, MRN: [id number]  No chief complaint on file.  81yF history of AFs/p maze, breast cancer and SCC vulva, chronic diastolic heart failure, CAD, DM, MR s/p ring annuloplasty, PH/TR, emphysema, COPD, smoking  Dyspnea, hoarseness, trouble swallowing since there's been some mold coming out of her vents or black soot. She insists it feels like there's a blockage to airflow at the level of her throat. She has an albuterol inhaler which helps for a while. She also feels like when she's eating she'll get strangled by her saliva or food.    Interval HPI  Started on protonix, stiolto and referred to ENT last visit  Otherwise pertinent review of systems is negative.  Past Medical History:  Diagnosis Date   Anxiety    Arthritis    back   Atrial fibrillation ([place])    Breast cancer ([place])    right breast cancer - 3 years ago   Chronic diastolic heart failure (H[facility])    ECHO: 06/20/2017 - Grade 2 Diastolic Dysfunction    Coronary artery disease    Depression    Diabetes mellitus without complication ([facility])    diet   Dysrhythmia    afib   Fibromyalgia    Herniated disc    Hyperlipemia    Hypertension    Iron deficiency anemia    Longstanding persistent atrial fibrillation ([facility])    Lower back pain    Lumbar stenosis    L4-5   Personal history of chemotherapy    Personal history of radiation therapy    Pulmonary hypertension ([place])    S/P Maze operation for atrial fibrillation 05/09/2018   Complete bilateral atrial lesion set using bipolar radiofrequency and cryothermy ablation with clipping of LA appendage   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   Docetaxel/Cytoxan Q 3 Weeks x 4 cycles   Torn rotator cuff    Tricuspid regurgitation      Family History  Problem Relation Age of Onset   Stroke Mother    Alzheimer's disease Mother    Heart attack Father    Heart attack Brother    Breast cancer Neg Hx    Colon cancer Neg Hx    Ovarian cancer Neg Hx    Endometrial cancer Neg Hx    Pancreatic cancer Neg Hx    Prostate cancer Neg Hx      Past Surgical History:  Procedure Laterality Date   ABDOMINAL HYSTERECTOMY     BREAST BIOPSY Right 09/04/2012   BREAST LUMPECTOMY Right 10/08/2012   CLIPPING OF ATRIAL APPENDAGE N/A 05/09/2018   Procedure: CLIPPING OF ATRIAL APPENDAGE USING ATRICLIP PRO2 CLIP SIZE 45MM;  Surgeon: Rexene Alberts, MD;  Location: [facility];  Service: Open Heart Surgery;  Laterality: N/A;   COLONOSCOPY     DILATION AND CURETTAGE OF UTERUS     LEFT HEART CATHETERIZATION WITH CORONARY ANGIOGRAM N/A 09/14/2014  Procedure: LEFT HEART CATHETERIZATION WITH CORONARY ANGIOGRAM;  Surgeon: [name], MD;  Location: [facility] CATH LAB;  Service: Cardiovascular;  Laterality: N/A;   MAZE N/A 05/09/2018   Procedure: MAZE;  Surgeon: Rexene Alberts, MD;  Location: [facility];  Service: Open Heart Surgery;  Laterality: N/A;   MITRAL VALVE REPAIR N/A 05/09/2018   Procedure: MITRAL VALVE REPAIR (MVR) USING MEMO 4D RING SIZE 26MM;  Surgeon: Rexene Alberts, MD;  Location: [place];  Service: Open Heart Surgery;  Laterality: N/A;   OPEN REDUCTION INTERNAL FIXATION (ORIF) PROXIMAL PHALANX Right 01/04/2016   Procedure: OPEN TREATMENT OF RIGHT THUMB PROXIMAL PHALANX FRACTURE;  Surgeon: Milly Jakob, MD;  Location: [facility];  Service: Orthopedics;  Laterality: Right;   PARTIAL MASTECTOMY WITH NEEDLE LOCALIZATION AND AXILLARY  SENTINEL LYMPH NODE BX Right 10/08/2012   Procedure: RIGHT PARTIAL MASTECTOMY WITH NEEDLE LOCALIZATION AND RIGHT AXILLARY SENTINEL LYMPH NODE BX;  Surgeon: Adin Hector, MD;  Location: [place];  Service: General;  Laterality: Right;  Injection of 1% Methylene Blue dye into right breast   PORTACATH PLACEMENT Left 10/08/2012   Procedure: INSERTION PORT-A-CATH WITH ULTRASOUND GUIDANCE;  Surgeon: Adin Hector, MD;  Location: [facility];  Service: General;  Laterality: Left;  Using 31F Powerport [place]   Posterior Lumbar Arthrodesis, Pedicle screw fixation, Posterolateral Arthodesis  03/17/11   RIGHT/LEFT HEART CATH AND CORONARY ANGIOGRAPHY N/A 05/02/2018   Procedure: RIGHT/LEFT HEART CATH AND CORONARY ANGIOGRAPHY;  Surgeon: [name], MD;  Location: [facility] CV LAB;  Service: Cardiovascular;  Laterality: N/A;   SHOULDER ARTHROSCOPY WITH ROTATOR CUFF REPAIR AND SUBACROMIAL DECOMPRESSION  06/04/2012   Procedure: SHOULDER ARTHROSCOPY WITH ROTATOR CUFF REPAIR AND SUBACROMIAL DECOMPRESSION;  Surgeon: Nita Sells, MD;  Location: [facility];  Service: Orthopedics;  Laterality: Left;  LEFT SHOULDER ARTHROSCOPY WITH ROTATOR CUFF REPAIR AND SUBACROMIAL DECOMPRESSION AND DISTAL CLAVICLE RESECTION   TEE WITHOUT CARDIOVERSION N/A 05/03/2018   Procedure: TRANSESOPHAGEAL ECHOCARDIOGRAM (TEE);  Surgeon: Dixie Dials, MD;  Location: [facility] ENDOSCOPY;  Service: Cardiovascular;  Laterality: N/A;   TEE WITHOUT CARDIOVERSION N/A 05/09/2018   Procedure: TRANSESOPHAGEAL ECHOCARDIOGRAM (TEE);  Surgeon: Rexene Alberts, MD;  Location: [place];  Service: Open Heart Surgery;  Laterality: N/A;   TONSILLECTOMY     TRICUSPID VALVE REPLACEMENT N/A 05/09/2018   Procedure: TRICUSPID VALVE REPAIR USING MC3 RING SIZE 28MM;  Surgeon: Rexene Alberts, MD;  Location: [facility];  Service: Open Heart Surgery;  Laterality: N/A;   TUBAL LIGATION     VULVECTOMY Left 02/06/2019   Procedure: POSTERIOR RADICAL VULVECTOMY;   Surgeon: Everitt Amber, MD;  Location: [facility];  Service: Gynecology;  Laterality: Left;   VULVECTOMY PARTIAL     Wide Local Excision  01/2010   Bilateral groin excisions, re-excision of vulva    Social History   Socioeconomic History   Marital status: Widowed    Spouse name: Not on file   Number of children: Not on file   Years of education: Not on file   Highest education level: Not on file  Occupational History   Occupation: retired  Tobacco Use   Smoking status: Former    Years: 50.00    Types: Cigarettes    Quit date: 05/29/2009    Years since quitting: 12.8   Smokeless tobacco: Never  Vaping Use   Vaping Use: Never used  Substance and Sexual Activity   Alcohol use: Yes    Comment: rare   Drug use: No  Sexual activity: Not Currently  Other Topics Concern   Not on file  Social History Narrative   Tobacco use cigarettes: former smoker, quit in year Sept 2011, Pack-year Hx: 58, tobacco history last updated 10/10/2013. No smoking. Per patient stopped smoking 9 months ago. No alcohol. Caffeine.: yes, 2+ servings daily, tea. No recreational drug use. Exercise: [employer] water [employer]. Marital status: widow.   Social Determinants of Health   Financial Resource Strain: Not on file  Food Insecurity: Not on file  Transportation Needs: Not on file  Physical Activity: Not on file  Stress: Not on file  Social Connections: Not on file  Intimate Partner Violence: Not on file     Allergies  Allergen Reactions   Shellfish Allergy Anaphylaxis and Swelling    Other reaction(s): Other (See Comments)   Shellfish-Derived Products Other (See Comments)     Outpatient Medications Prior to Visit  Medication Sig Dispense Refill   acetaminophen (TYLENOL) 500 MG tablet Take 500 mg by mouth every 6 (six) hours as needed (for pain.).     albuterol (VENTOLIN HFA) 108 (90 Base) MCG/ACT inhaler Inhale 2 puffs into the lungs every 6 (six) hours as needed for wheezing or shortness of breath.      amiodarone (PACERONE) 200 MG tablet Take 100 mg by mouth every other day.     amoxicillin (AMOXIL) 500 MG capsule Take 2,000 mg by mouth as needed (dental appointment).     aspirin EC 81 MG tablet Take 81 mg by mouth daily.     busPIRone (BUSPAR) 5 MG tablet Take 5 mg by mouth 2 (two) times daily.     co-enzyme Q-10 30 MG capsule Take 30 mg by mouth daily.     ELIQUIS 2.5 MG TABS tablet Take 2.5 mg by mouth 2 (two) times daily.     ferrous sulfate 325 (65 FE) MG EC tablet Take 325 mg by mouth once a week. Thursday's     FLUoxetine (PROZAC) 10 MG capsule Take 10 mg by mouth daily.     furosemide (LASIX) 40 MG tablet Take 1 tablet (40 mg total) by mouth daily. 30 tablet 1   gabapentin (NEURONTIN) 300 MG capsule Take 300 mg by mouth at bedtime.      ketoconazole (NIZORAL) 2 % cream Apply topically daily.     Lancets (ONETOUCH DELICA PLUS [id number]) MISC USE AS DIRECTED DAILY FOR 30 DAYS     loratadine (CLARITIN) 10 MG tablet Take 10 mg by mouth daily as needed for allergies.     Multiple Vitamins-Minerals (CENTRUM SILVER 50+WOMEN) TABS Take 1 tablet by mouth daily.     nitroGLYCERIN (NITROSTAT) 0.4 MG SL tablet Place 0.4 mg under the tongue every 5 (five) minutes x 3 doses as needed for chest pain.     ONETOUCH VERIO test strip FOR USE WHEN CHECKING BLOOD GLUCOSE ONCE DAILY ALTERNATING AM AND PM BEFORE MEALS FINGER STICK 90 DAYS  3   pantoprazole (PROTONIX) 40 MG tablet Take 1 tablet (40 mg total) by mouth daily. 90 tablet 0   pravastatin (PRAVACHOL) 10 MG tablet Take 10 mg by mouth at bedtime.     spironolactone (ALDACTONE) 25 MG tablet Take 12.5 mg by mouth every morning.     Tiotropium Bromide-Olodaterol (STIOLTO RESPIMAT) 2.5-2.5 MCG/ACT AERS Inhale 2 puffs into the lungs daily. 2 each 0   No facility-administered medications prior to visit.       Objective:   Physical Exam:  General appearance: 81 y.o., female, NAD, conversant  Eyes: anicteric sclerae; PERRL, tracking  appropriately HENT: NCAT; MMM, hoarse, mallampati 4 Neck: Trachea midline; no lymphadenopathy, no JVD Lungs: CTAB, no crackles, no wheeze, with normal respiratory effort CV: RRR, no murmur  Abdomen: Soft, non-tender; non-distended, BS present  Extremities: No peripheral edema, warm Skin: Normal turgor and texture; no rash Psych: Appropriate affect Neuro: Alert and oriented to person and place, no focal deficit     There were no vitals filed for this visit.    on RA BMI Readings from Last 3 Encounters:  02/01/22 31.33 kg/m  01/19/22 31.91 kg/m  08/04/21 33.56 kg/m   Wt Readings from Last 3 Encounters:  02/01/22 203 lb (92.1 kg)  01/19/22 206 lb 12.8 oz (93.8 kg)  08/04/21 213 lb 12.8 oz (97 kg)     CBC    Component Value Date/Time   WBC 7.9 02/19/2021 1542   RBC 4.23 02/19/2021 1542   HGB 11.4 (L) 02/19/2021 1542   HGB 10.6 (L) 01/11/2015 1054   HCT 36.8 02/19/2021 1542   HCT 33.1 (L) 01/11/2015 1054   PLT 183 02/19/2021 1542   PLT 207 01/11/2015 1054   MCV 87.0 02/19/2021 1542   MCV 84.1 01/11/2015 1054   MCH 27.0 02/19/2021 1542   MCHC 31.0 02/19/2021 1542   RDW 15.7 (H) 02/19/2021 1542   RDW 16.0 (H) 01/11/2015 1054   LYMPHSABS 2.8 02/19/2021 1542   LYMPHSABS 1.4 01/11/2015 1054   MONOABS 0.7 02/19/2021 1542   MONOABS 0.5 01/11/2015 1054   EOSABS 0.1 02/19/2021 1542   EOSABS 0.1 01/11/2015 1054   BASOSABS 0.0 02/19/2021 1542   BASOSABS 0.1 01/11/2015 1054    Chest Imaging: CXR 02/19/21 reviewed by me with prominent vasculature, cardiomegaly  CTA Dissection protocol 05/06/18 reviewed by me with mosaic attenuation, cardiomegaly, small stable right effusion  Pulmonary Functions Testing Results:    Latest Ref Rng & Units 02/06/2018   11:44 AM  PFT Results  FVC-Pre L 1.26   FVC-Predicted Pre % 51   FVC-Post L 1.32   FVC-Predicted Post % 54   Pre FEV1/FVC % % 83   Post FEV1/FCV % % 87   FEV1-Pre L 1.05   FEV1-Predicted Pre % 55   FEV1-Post L 1.15    DLCO uncorrected ml/min/mmHg 11.02   DLCO UNC% % 40   DLCO corrected ml/min/mmHg 11.96   DLCO COR %Predicted % 44   DLVA Predicted % 77   TLC L 4.11   TLC % Predicted % 76   RV % Predicted % 95    Mixed moderate obstruction (FEV1/SVC 57, FEV1 post BD 61) and restriction, severely reduced diffusing capacity    Echocardiogram:   TTE 05/16/18:  - Left ventricle: The cavity size was normal. Systolic function was    normal. The estimated ejection fraction was in the range of 55%    to 60%. Wall motion was normal; there were no regional wall    motion abnormalities. Doppler parameters are consistent with    abnormal left ventricular relaxation (grade 1 diastolic    dysfunction).  - Aortic valve: There was mild to moderate regurgitation. Valve    area (VTI): 2.11 cm^2. Valve area (Vmax): 1.99 cm^2. Valve area    (Vmean): 2.15 cm^2.  - Mitral valve: Valve area by continuity equation (using LVOT    flow): 1.91 cm^2.  - Left atrium: The atrium was mildly dilated.  - Atrial septum: No defect or patent foramen ovale was identified.  - Pulmonic valve: Peak gradient (S):  12 mm Hg.     Assessment & Plan:   # Dyspnea  # COPD gold functional group B  # Hoarseness # Globus sensation  Not sure how much her dyspnea may be due to untreated COPD vs whether there may be component of VCD or other upper airway issue. Frequent throat clearing, globus sensation make me wonder if there is LPR/irritable larynx.  Plan: - start protonix 40 mg 30 minutes before dinner EVERY night - start stiolto 2 puffs daily - albuterol as needed - discussed conservative measures to limit throat clearing  - ENT referral placed today - if feeling 100% better after above measures then can cancel appointment     Diane Hurter, MD [facility] 04/17/2022 3:30 PM

## 2022-04-18 DIAGNOSIS — K219 Gastro-esophageal reflux disease without esophagitis: Secondary | ICD-10-CM | POA: Diagnosis not present

## 2022-04-18 DIAGNOSIS — R490 Dysphonia: Secondary | ICD-10-CM | POA: Diagnosis not present

## 2022-04-19 ENCOUNTER — Encounter: Payer: Self-pay | Admitting: Student

## 2022-04-19 ENCOUNTER — Ambulatory Visit (INDEPENDENT_AMBULATORY_CARE_PROVIDER_SITE_OTHER): Payer: Medicare Other | Admitting: Student

## 2022-04-19 VITALS — BP 124/60 | HR 64 | Temp 98.2°F | Ht 67.5 in | Wt 209.8 lb

## 2022-04-19 DIAGNOSIS — J449 Chronic obstructive pulmonary disease, unspecified: Secondary | ICD-10-CM | POA: Diagnosis not present

## 2022-04-19 DIAGNOSIS — R0609 Other forms of dyspnea: Secondary | ICD-10-CM | POA: Diagnosis not present

## 2022-04-19 LAB — BASIC METABOLIC PANEL
BUN: 33 mg/dL — ABNORMAL HIGH (ref 6–23)
CO2: 24 mEq/L (ref 19–32)
Calcium: 8.9 mg/dL (ref 8.4–10.5)
Chloride: 105 mEq/L (ref 96–112)
Creatinine, Ser: 2.21 mg/dL — ABNORMAL HIGH (ref 0.40–1.20)
GFR: 20.78 mL/min — ABNORMAL LOW (ref >60.00)
Glucose, Bld: 100 mg/dL — ABNORMAL HIGH (ref 70–99)
Potassium: 3.9 mEq/L (ref 3.5–5.1)
Sodium: 138 mEq/L (ref 135–145)

## 2022-04-19 LAB — CBC WITH DIFFERENTIAL/PLATELET
Basophils Absolute: 0 10*3/uL (ref 0.0–0.1)
Basophils Relative: 0.7 % (ref 0.0–3.0)
Eosinophils Absolute: 0.1 10*3/uL (ref 0.0–0.7)
Eosinophils Relative: 1.8 % (ref 0.0–5.0)
HCT: 27.6 % — ABNORMAL LOW (ref 36.0–46.0)
Hemoglobin: 8.8 g/dL — ABNORMAL LOW (ref 12.0–15.0)
Lymphocytes Relative: 33.2 % (ref 12.0–46.0)
Lymphs Abs: 2.2 10*3/uL (ref 0.7–4.0)
MCHC: 31.8 g/dL (ref 30.0–36.0)
MCV: 85.2 fl (ref 78.0–100.0)
Monocytes Absolute: 0.6 10*3/uL (ref 0.1–1.0)
Monocytes Relative: 8.9 % (ref 3.0–12.0)
Neutro Abs: 3.7 10*3/uL (ref 1.4–7.7)
Neutrophils Relative %: 55.4 % (ref 43.0–77.0)
Platelets: 244 10*3/uL (ref 150.0–400.0)
RBC: 3.24 Mil/uL — ABNORMAL LOW (ref 3.87–5.11)
RDW: 16.3 % — ABNORMAL HIGH (ref 11.5–15.5)
WBC: 6.7 10*3/uL (ref 4.0–10.5)

## 2022-04-19 LAB — BRAIN NATRIURETIC PEPTIDE: Pro B Natriuretic peptide (BNP): 503 pg/mL — ABNORMAL HIGH (ref 0.0–100.0)

## 2022-04-19 MED ORDER — PANTOPRAZOLE SODIUM 40 MG PO TBEC: 40.0000 mg | DELAYED_RELEASE_TABLET | Freq: Every day | ORAL | 0 refills | Status: DC

## 2022-04-19 NOTE — Patient Instructions (Addendum)
-   labs today - continue protonix 40 mg 30 minutes before dinner EVERY night - trial trelegy 1 puff once daily rinse mouth and gargle after use. Let me know if very helpful and I can go ahead and prescribe it - albuterol as needed - discussed conservative measures to limit throat clearing  - see you in 8 weeks with breathing tests

## 2022-04-25 DIAGNOSIS — I129 Hypertensive chronic kidney disease with stage 1 through stage 4 chronic kidney disease, or unspecified chronic kidney disease: Secondary | ICD-10-CM | POA: Diagnosis not present

## 2022-04-25 DIAGNOSIS — E1121 Type 2 diabetes mellitus with diabetic nephropathy: Secondary | ICD-10-CM | POA: Diagnosis not present

## 2022-05-11 DIAGNOSIS — J449 Chronic obstructive pulmonary disease, unspecified: Secondary | ICD-10-CM | POA: Diagnosis not present

## 2022-05-11 DIAGNOSIS — Z79899 Other long term (current) drug therapy: Secondary | ICD-10-CM | POA: Diagnosis not present

## 2022-05-11 DIAGNOSIS — F32A Depression, unspecified: Secondary | ICD-10-CM | POA: Diagnosis not present

## 2022-05-11 DIAGNOSIS — E1140 Type 2 diabetes mellitus with diabetic neuropathy, unspecified: Secondary | ICD-10-CM | POA: Diagnosis not present

## 2022-05-11 DIAGNOSIS — G620 Drug-induced polyneuropathy: Secondary | ICD-10-CM | POA: Diagnosis not present

## 2022-05-11 DIAGNOSIS — Z0000 Encounter for general adult medical examination without abnormal findings: Secondary | ICD-10-CM | POA: Diagnosis not present

## 2022-05-11 DIAGNOSIS — I129 Hypertensive chronic kidney disease with stage 1 through stage 4 chronic kidney disease, or unspecified chronic kidney disease: Secondary | ICD-10-CM | POA: Diagnosis not present

## 2022-05-11 DIAGNOSIS — Z23 Encounter for immunization: Secondary | ICD-10-CM | POA: Diagnosis not present

## 2022-05-11 DIAGNOSIS — I10 Essential (primary) hypertension: Secondary | ICD-10-CM | POA: Diagnosis not present

## 2022-05-11 DIAGNOSIS — C50111 Malignant neoplasm of central portion of right female breast: Secondary | ICD-10-CM | POA: Diagnosis not present

## 2022-05-15 DIAGNOSIS — J479 Bronchiectasis, uncomplicated: Secondary | ICD-10-CM | POA: Diagnosis not present

## 2022-05-15 DIAGNOSIS — E1151 Type 2 diabetes mellitus with diabetic peripheral angiopathy without gangrene: Secondary | ICD-10-CM | POA: Diagnosis not present

## 2022-05-15 DIAGNOSIS — I129 Hypertensive chronic kidney disease with stage 1 through stage 4 chronic kidney disease, or unspecified chronic kidney disease: Secondary | ICD-10-CM | POA: Diagnosis not present

## 2022-05-15 DIAGNOSIS — E1140 Type 2 diabetes mellitus with diabetic neuropathy, unspecified: Secondary | ICD-10-CM | POA: Diagnosis not present

## 2022-05-15 DIAGNOSIS — I700 Atherosclerosis of aorta: Secondary | ICD-10-CM | POA: Diagnosis not present

## 2022-05-15 DIAGNOSIS — I200 Unstable angina: Secondary | ICD-10-CM | POA: Diagnosis not present

## 2022-05-15 DIAGNOSIS — G620 Drug-induced polyneuropathy: Secondary | ICD-10-CM | POA: Diagnosis not present

## 2022-05-15 DIAGNOSIS — M1990 Unspecified osteoarthritis, unspecified site: Secondary | ICD-10-CM | POA: Diagnosis not present

## 2022-05-15 DIAGNOSIS — J449 Chronic obstructive pulmonary disease, unspecified: Secondary | ICD-10-CM | POA: Diagnosis not present

## 2022-05-15 DIAGNOSIS — I5032 Chronic diastolic (congestive) heart failure: Secondary | ICD-10-CM | POA: Diagnosis not present

## 2022-06-10 NOTE — Progress Notes (Signed)
Synopsis: Referred for dyspnea by Kathalene Frames, *  Subjective:   PATIENT ID: Diane [name] GENDER: female DOB: May 02, 1941, MRN: [id number]  Chief Complaint  Patient presents with   Follow-up    PFT done today. Breathing is unchanged. She is using trelegy multiple times per day and albuterol inhaler several times per wk.    82yF history of AFs/p maze, breast cancer and SCC vulva, chronic diastolic heart failure, CAD, DM, MR s/p ring annuloplasty, PH/TR, emphysema, COPD, smoking  Dyspnea, hoarseness, trouble swallowing since there's been some mold coming out of her vents or black soot. She insists it feels like there's a blockage to airflow at the level of her throat. She has an albuterol inhaler which helps for a while. She also feels like when she's eating she'll get strangled by her saliva or food.    Interval HPI  Last visit trial trelegy, discussed TLC measures for throat clearing, protonix 30 min before dinner nightly.   Seen by ENT 06/06/22 no change  Just started trelegy a couple days ago maybe mild improvement.    PFTs stable   Otherwise pertinent review of systems is negative.  Past Medical History:  Diagnosis Date   Anxiety    Arthritis    back   Atrial fibrillation ([place])    Breast cancer ([place])    right breast cancer - 3 years ago   Chronic diastolic heart failure (HCC)    ECHO: 06/20/2017 - Grade 2 Diastolic Dysfunction    Coronary artery disease    Depression    Diabetes mellitus without complication (HCC)    diet   Dysrhythmia    afib   Fibromyalgia    Herniated disc    Hyperlipemia    Hypertension    Iron deficiency anemia    Longstanding persistent atrial fibrillation (HCC)    Lower back pain    Lumbar stenosis    L4-5   Personal history of chemotherapy    Personal history of radiation therapy    Pulmonary hypertension ([place])    S/P Maze operation for atrial fibrillation 05/09/2018   Complete bilateral atrial lesion set using  bipolar radiofrequency and cryothermy ablation with clipping of LA appendage   S/P mitral valve repair 05/09/2018   26 mm Sorin Memo 4D ring annuloplasty   S/P radiation therapy 01/23/2013-03/06/2013   Right breast / 46 Gy in 23 fractions / Right breast boost / 14 Gy in 7 fractions   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   Docetaxel/Cytoxan Q 3 Weeks x 4 cycles   Torn rotator cuff    Tricuspid regurgitation      Family History  Problem Relation Age of Onset   Stroke Mother    Alzheimer's disease Mother    Heart attack Father    Heart attack Brother    Breast cancer Neg Hx    Colon cancer Neg Hx    Ovarian cancer Neg Hx    Endometrial cancer Neg Hx    Pancreatic cancer Neg Hx    Prostate cancer Neg Hx      Past Surgical History:  Procedure Laterality Date   ABDOMINAL HYSTERECTOMY     BREAST BIOPSY Right 09/04/2012   BREAST LUMPECTOMY Right 10/08/2012   CLIPPING  OF ATRIAL APPENDAGE N/A 05/09/2018   Procedure: CLIPPING OF ATRIAL APPENDAGE USING ATRICLIP PRO2 CLIP SIZE 45MM;  Surgeon: Rexene Alberts, MD;  Location: [place];  Service: Open Heart Surgery;  Laterality: N/A;   COLONOSCOPY     DILATION AND CURETTAGE OF UTERUS     LEFT HEART CATHETERIZATION WITH CORONARY ANGIOGRAM N/A 09/14/2014   Procedure: LEFT HEART CATHETERIZATION WITH CORONARY ANGIOGRAM;  Surgeon: [name], MD;  Location: [facility] CATH LAB;  Service: Cardiovascular;  Laterality: N/A;   MAZE N/A 05/09/2018   Procedure: MAZE;  Surgeon: Rexene Alberts, MD;  Location: [place];  Service: Open Heart Surgery;  Laterality: N/A;   MITRAL VALVE REPAIR N/A 05/09/2018   Procedure: MITRAL VALVE REPAIR (MVR) USING MEMO 4D RING SIZE 26MM;  Surgeon: Rexene Alberts, MD;  Location: [place];  Service: Open Heart Surgery;   Laterality: N/A;   OPEN REDUCTION INTERNAL FIXATION (ORIF) PROXIMAL PHALANX Right 01/04/2016   Procedure: OPEN TREATMENT OF RIGHT THUMB PROXIMAL PHALANX FRACTURE;  Surgeon: Milly Jakob, MD;  Location: [place];  Service: Orthopedics;  Laterality: Right;   PARTIAL MASTECTOMY WITH NEEDLE LOCALIZATION AND AXILLARY SENTINEL LYMPH NODE BX Right 10/08/2012   Procedure: RIGHT PARTIAL MASTECTOMY WITH NEEDLE LOCALIZATION AND RIGHT AXILLARY SENTINEL LYMPH NODE BX;  Surgeon: Adin Hector, MD;  Location: [place];  Service: General;  Laterality: Right;  Injection of 1% Methylene Blue dye into right breast   PORTACATH PLACEMENT Left 10/08/2012   Procedure: INSERTION PORT-A-CATH WITH ULTRASOUND GUIDANCE;  Surgeon: Adin Hector, MD;  Location: [place];  Service: General;  Laterality: Left;  Using 70F Powerport Clearvue   Posterior Lumbar Arthrodesis, Pedicle screw fixation, Posterolateral Arthodesis  03/17/11   RIGHT/LEFT HEART CATH AND CORONARY ANGIOGRAPHY N/A 05/02/2018   Procedure: RIGHT/LEFT HEART CATH AND CORONARY ANGIOGRAPHY;  Surgeon: [name], MD;  Location: [place] CV LAB;  Service: Cardiovascular;  Laterality: N/A;   SHOULDER ARTHROSCOPY WITH ROTATOR CUFF REPAIR AND SUBACROMIAL DECOMPRESSION  06/04/2012   Procedure: SHOULDER ARTHROSCOPY WITH ROTATOR CUFF REPAIR AND SUBACROMIAL DECOMPRESSION;  Surgeon: Nita Sells, MD;  Location: [place];  Service: Orthopedics;  Laterality: Left;  LEFT SHOULDER ARTHROSCOPY WITH ROTATOR CUFF REPAIR AND SUBACROMIAL DECOMPRESSION AND DISTAL CLAVICLE RESECTION   TEE WITHOUT CARDIOVERSION N/A 05/03/2018   Procedure: TRANSESOPHAGEAL ECHOCARDIOGRAM (TEE);  Surgeon: Dixie Dials, MD;  Location[facility] ENDOSCOPY;  Service: Cardiovascular;  Laterality: N/A;   TEE WITHOUT CARDIOVERSION N/A 05/09/2018   Procedure: TRANSESOPHAGEAL ECHOCARDIOGRAM (TEE);  Surgeon: Rexene Alberts, MD;  Location: [place];  Service: Open Heart Surgery;   Laterality: N/A;   TONSILLECTOMY     TRICUSPID VALVE REPLACEMENT N/A 05/09/2018   Procedure: TRICUSPID VALVE REPAIR USING MC3 RING SIZE 28MM;  Surgeon: Rexene Alberts, MD;  Location: [place];  Service: Open Heart Surgery;  Laterality: N/A;   TUBAL LIGATION     VULVECTOMY Left 02/06/2019   Procedure: POSTERIOR RADICAL VULVECTOMY;  Surgeon: Everitt Amber, MD;  Location: [facility];  Service: Gynecology;  Laterality: Left;   VULVECTOMY PARTIAL     Wide Local Excision  01/2010   Bilateral groin excisions, re-excision of vulva    Social History   Socioeconomic History   Marital status: Widowed    Spouse name: Not on file   Number of children: Not on file   Years of education: Not on file   Highest education level: Not on file  Occupational History   Occupation: retired  Tobacco Use  Smoking status: Former    Years: 50.00    Types: Cigarettes    Quit date: 05/29/2009    Years since quitting: 13.0   Smokeless tobacco: Never  Vaping Use   Vaping Use: Never used  Substance and Sexual Activity   Alcohol use: Yes    Comment: rare   Drug use: No   Sexual activity: Not Currently  Other Topics Concern   Not on file  Social History Narrative   Tobacco use cigarettes: former smoker, quit in year Sept 2011, Pack-year Hx: 7, tobacco history last updated 10/10/2013. No smoking. Per patient stopped smoking 9 months ago. No alcohol. Caffeine.: yes, 2+ servings daily, tea. No recreational drug use. Exercise: [employer] water [employer]. Marital status: widow.   Social Determinants of Health   Financial Resource Strain: Not on file  Food Insecurity: Not on file  Transportation Needs: Not on file  Physical Activity: Not on file  Stress: Not on file  Social Connections: Not on file  Intimate Partner Violence: Not on file     Allergies  Allergen Reactions   Shellfish Allergy Anaphylaxis and Swelling    Other reaction(s): Other (See Comments)   Shellfish-Derived Products Other (See Comments)      Outpatient Medications Prior to Visit  Medication Sig Dispense Refill   acetaminophen (TYLENOL) 500 MG tablet Take 500 mg by mouth every 6 (six) hours as needed (for pain.).     albuterol (VENTOLIN HFA) 108 (90 Base) MCG/ACT inhaler Inhale 2 puffs into the lungs every 6 (six) hours as needed for wheezing or shortness of breath.     amiodarone (PACERONE) 200 MG tablet Take 100 mg by mouth every other day.     amoxicillin (AMOXIL) 500 MG capsule Take 2,000 mg by mouth as needed (dental appointment).     aspirin EC 81 MG tablet Take 81 mg by mouth daily.     busPIRone (BUSPAR) 5 MG tablet Take 5 mg by mouth 2 (two) times daily.     co-enzyme Q-10 30 MG capsule Take 30 mg by mouth daily.     ELIQUIS 2.5 MG TABS tablet Take 2.5 mg by mouth 2 (two) times daily.     ferrous sulfate 325 (65 FE) MG EC tablet Take 325 mg by mouth once a week. Thursday's     FLUoxetine (PROZAC) 10 MG capsule Take 10 mg by mouth daily.     Fluticasone-Umeclidin-Vilant (TRELEGY ELLIPTA) 200-62.5-25 MCG/ACT AEPB Inhale 1 puff into the lungs daily.     furosemide (LASIX) 40 MG tablet Take 1 tablet (40 mg total) by mouth daily. 30 tablet 1   gabapentin (NEURONTIN) 300 MG capsule Take 300 mg by mouth at bedtime.      ketoconazole (NIZORAL) 2 % cream Apply topically daily.     Lancets (ONETOUCH DELICA PLUS [id number]) MISC USE AS DIRECTED DAILY FOR 30 DAYS     loratadine (CLARITIN) 10 MG tablet Take 10 mg by mouth daily as needed for allergies.     Multiple Vitamins-Minerals (CENTRUM SILVER 50+WOMEN) TABS Take 1 tablet by mouth daily.     nitroGLYCERIN (NITROSTAT) 0.4 MG SL tablet Place 0.4 mg under the tongue every 5 (five) minutes x 3 doses as needed for chest pain.     ONETOUCH VERIO test strip FOR USE WHEN CHECKING BLOOD GLUCOSE ONCE DAILY ALTERNATING AM AND PM BEFORE MEALS FINGER STICK 90 DAYS  3   pantoprazole (PROTONIX) 40 MG tablet Take 1 tablet (40 mg total) by mouth daily. 90 tablet  0   pravastatin (PRAVACHOL) 10 MG  tablet Take 10 mg by mouth at bedtime.     spironolactone (ALDACTONE) 25 MG tablet Take 12.5 mg by mouth every morning.     Tiotropium Bromide-Olodaterol (STIOLTO RESPIMAT) 2.5-2.5 MCG/ACT AERS Inhale 2 puffs into the lungs daily. 2 each 0   No facility-administered medications prior to visit.       Objective:   Physical Exam:  General appearance: 82 y.o., female, NAD, conversant  Eyes: anicteric sclerae; PERRL, tracking appropriately HENT: NCAT; MMM, hoarse, mallampati 4 Neck: Trachea midline; no lymphadenopathy, no JVD Lungs: CTAB, no crackles, no wheeze, with normal respiratory effort CV: RRR, no murmur  Abdomen: Soft, non-tender; non-distended, BS present  Extremities: No peripheral edema, warm Skin: Normal turgor and texture; no rash Psych: Appropriate affect Neuro: Alert and oriented to person and place, no focal deficit     Vitals:   06/14/22 1327  BP: 132/74  Pulse: 65  Temp: 98.1 F (36.7 C)  TempSrc: Oral  SpO2: 100%  Weight: 198 lb (89.8 kg)  Height: 5' 7.5" (1.715 m)     100% on RA BMI Readings from Last 3 Encounters:  06/14/22 30.55 kg/m  04/19/22 32.37 kg/m  02/01/22 31.33 kg/m   Wt Readings from Last 3 Encounters:  06/14/22 198 lb (89.8 kg)  04/19/22 209 lb 12.8 oz (95.2 kg)  02/01/22 203 lb (92.1 kg)     CBC    Component Value Date/Time   WBC 6.7 04/19/2022 1328   RBC 3.24 (L) 04/19/2022 1328   HGB 8.8 Repeated and verified X2. (L) 04/19/2022 1328   HGB 10.6 (L) 01/11/2015 1054   HCT 27.6 (L) 04/19/2022 1328   HCT 33.1 (L) 01/11/2015 1054   PLT 244.0 04/19/2022 1328   PLT 207 01/11/2015 1054   MCV 85.2 04/19/2022 1328   MCV 84.1 01/11/2015 1054   MCH 27.0 02/19/2021 1542   MCHC 31.8 04/19/2022 1328   RDW 16.3 (H) 04/19/2022 1328   RDW 16.0 (H) 01/11/2015 1054   LYMPHSABS 2.2 04/19/2022 1328   LYMPHSABS 1.4 01/11/2015 1054   MONOABS 0.6 04/19/2022 1328   MONOABS 0.5 01/11/2015 1054   EOSABS 0.1 04/19/2022 1328   EOSABS 0.1  01/11/2015 1054   BASOSABS 0.0 04/19/2022 1328   BASOSABS 0.1 01/11/2015 1054    Chest Imaging: CXR 02/19/21 reviewed by me with prominent vasculature, cardiomegaly  CTA Dissection protocol 05/06/18 reviewed by me with mosaic attenuation, cardiomegaly, small stable right effusion  Pulmonary Functions Testing Results:    Latest Ref Rng & Units 06/14/2022   10:47 AM 02/06/2018   11:44 AM  PFT Results  FVC-Pre L 1.35  P 1.26   FVC-Predicted Pre % 46  P 51   FVC-Post L 1.43  P 1.32   FVC-Predicted Post % 49  P 54   Pre FEV1/FVC % % 83  P 83   Post FEV1/FCV % % 82  P 87   FEV1-Pre L 1.12  P 1.05   FEV1-Predicted Pre % 51  P 55   FEV1-Post L 1.17  P 1.15   DLCO uncorrected ml/min/mmHg 8.94  P 11.02   DLCO UNC% % 44  P 40   DLCO corrected ml/min/mmHg 8.94  P 11.96   DLCO COR %Predicted % 44  P 44   DLVA Predicted % 85  P 77   TLC L  4.11   TLC % Predicted %  76   RV % Predicted %  95  P Preliminary result   PFT 06/14/22 stable  PFT 2019 with Mixed moderate obstruction (FEV1/SVC 57, FEV1 post BD 61) and restriction, severely reduced diffusing capacity    Echocardiogram:   TTE 05/16/18:  - Left ventricle: The cavity size was normal. Systolic function was    normal. The estimated ejection fraction was in the range of 55%    to 60%. Wall motion was normal; there were no regional wall    motion abnormalities. Doppler parameters are consistent with    abnormal left ventricular relaxation (grade 1 diastolic    dysfunction).  - Aortic valve: There was mild to moderate regurgitation. Valve    area (VTI): 2.11 cm^2. Valve area (Vmax): 1.99 cm^2. Valve area    (Vmean): 2.15 cm^2.  - Mitral valve: Valve area by continuity equation (using LVOT    flow): 1.91 cm^2.  - Left atrium: The atrium was mildly dilated.  - Atrial septum: No defect or patent foramen ovale was identified.  - Pulmonic valve: Peak gradient (S): 12 mm Hg.     Assessment & Plan:   # Dyspnea  # COPD gold  functional group B  # Hoarseness # Globus sensation  Not sure how much her dyspnea may be due to untreated COPD vs cardiac issue vs deconditioning. Frequent throat clearing, globus sensation make me wonder if there is LPR/irritable larynx but hasn't improved a ton on ppi.  Plan: - continue protonix 40 mg 30 minutes before dinner EVERY night - start trelegy 1 puff once daily rinse mouth and gargle after use - albuterol as needed - discussed conservative measures to limit throat clearing  - referred to pulmonary rehab - RTC 3 months      Maryjane Hurter, MD [facility] 06/14/2022 1:32 PM

## 2022-06-14 ENCOUNTER — Encounter: Payer: Self-pay | Admitting: Student

## 2022-06-14 ENCOUNTER — Ambulatory Visit: Payer: Medicare HMO | Admitting: Student

## 2022-06-14 VITALS — BP 132/74 | HR 65 | Temp 98.1°F | Ht 67.5 in | Wt 198.0 lb

## 2022-06-14 DIAGNOSIS — J449 Chronic obstructive pulmonary disease, unspecified: Secondary | ICD-10-CM

## 2022-06-14 DIAGNOSIS — R0609 Other forms of dyspnea: Secondary | ICD-10-CM

## 2022-06-14 LAB — PULMONARY FUNCTION TEST
DL/VA % pred: 85 %
DL/VA: 3.43 ml/min/mmHg/L
DLCO cor % pred: 44 %
DLCO cor: 8.94 ml/min/mmHg
DLCO unc % pred: 44 %
DLCO unc: 8.94 ml/min/mmHg
FEF 25-75 Post: 1.44 L/sec
FEF 25-75 Pre: 1.25 L/sec
FEF2575-%Change-Post: 15 %
FEF2575-%Pred-Post: 91 %
FEF2575-%Pred-Pre: 79 %
FEV1-%Change-Post: 4 %
FEV1-%Pred-Post: 54 %
FEV1-%Pred-Pre: 51 %
FEV1-Post: 1.17 L
FEV1-Pre: 1.12 L
FEV1FVC-%Change-Post: -1 %
FEV1FVC-%Pred-Pre: 112 %
FEV6-%Change-Post: 6 %
FEV6-%Pred-Post: 51 %
FEV6-%Pred-Pre: 48 %
FEV6-Post: 1.43 L
FEV6-Pre: 1.35 L
FEV6FVC-%Pred-Post: 105 %
FEV6FVC-%Pred-Pre: 105 %
FVC-%Change-Post: 6 %
FVC-%Pred-Post: 49 %
FVC-%Pred-Pre: 46 %
FVC-Post: 1.43 L
FVC-Pre: 1.35 L
Post FEV1/FVC ratio: 82 %
Post FEV6/FVC ratio: 100 %
Pre FEV1/FVC ratio: 83 %
Pre FEV6/FVC Ratio: 100 %

## 2022-06-14 MED ORDER — TRELEGY ELLIPTA 100-62.5-25 MCG/ACT IN AEPB: 1.0000 | INHALATION_SPRAY | Freq: Every day | RESPIRATORY_TRACT | 11 refills | Status: DC

## 2022-06-14 NOTE — Progress Notes (Signed)
Full PFT performed today.

## 2022-06-14 NOTE — Patient Instructions (Signed)
Full PFT performed today.

## 2022-06-14 NOTE — Patient Instructions (Addendum)
-  if copay >$50 for trelegy call us and we will try to find alternative inhaler - trial trelegy 1 puff once daily till next visit rinse mouth and gargle after use  - albuterol as needed - referral placed to pulmonary rehab - discussed conservative measures to limit throat clearing  - see you in 8 weeks with breathing tests

## 2022-06-20 ENCOUNTER — Telehealth (HOSPITAL_COMMUNITY): Payer: Self-pay

## 2022-06-20 NOTE — Telephone Encounter (Signed)
Called patient to see if she was interested in participating in the Pulmonary Rehab Program. Patient stated yes. Patient will come in for orientation on 06/23/22'@1'$ :00pm and will attend the 1:15pm exercise class.

## 2022-06-20 NOTE — Telephone Encounter (Signed)
Pt insurance is active and benefits verified through [facility] Co-pay 0, DED 0/0 met, out of pocket $8,850/$15 met, co-insurance 0%. no pre-authorization required, Elena/[employer] 06/20/22'@2'$ :23pm REF# [id number]

## 2022-06-21 ENCOUNTER — Encounter: Payer: Self-pay | Admitting: Podiatry

## 2022-06-21 ENCOUNTER — Ambulatory Visit (INDEPENDENT_AMBULATORY_CARE_PROVIDER_SITE_OTHER): Payer: Medicare HMO | Admitting: Podiatry

## 2022-06-21 DIAGNOSIS — E1140 Type 2 diabetes mellitus with diabetic neuropathy, unspecified: Secondary | ICD-10-CM | POA: Diagnosis not present

## 2022-06-21 DIAGNOSIS — M79676 Pain in unspecified toe(s): Secondary | ICD-10-CM

## 2022-06-21 DIAGNOSIS — B351 Tinea unguium: Secondary | ICD-10-CM

## 2022-06-21 NOTE — Progress Notes (Signed)
This patient returns to my office for at risk foot care.  This patient requires this care by a professional since this patient will be at risk due to having chronic kidney disease and diabetes.    This patient is unable to cut nails herself since the patient cannot reach her  nails.These nails are painful walking and wearing shoes.  This patient presents for at risk foot care today.  General Appearance  Alert, conversant and in no acute stress.  Vascular  Dorsalis pedis are palpable  bilaterally.  Posterior tibial pulses  are weakly palpable.Capillary return is within normal limits  bilaterally. Temperature is within normal limits  bilaterally.  Neurologic  Senn-Weinstein monofilament wire test diminished   bilaterally. Muscle power within normal limits bilaterally.  Nails Thick disfigured discolored nails with subungual debris  from hallux to fifth toes bilaterally. No evidence of bacterial infection or drainage bilaterally.  Orthopedic  No limitations of motion  feet .  No crepitus or effusions noted.  No bony pathology or digital deformities noted.  HAV  B/L.    Skin  normotropic skin with no porokeratosis noted bilaterally.  No signs of infections or ulcers noted.     Onychomycosis  Pain in right toes  Pain in left toes  Consent was obtained for treatment procedures.   Mechanical debridement of nails 1-5  bilaterally performed with a nail nipper.  Filed with dremel without incident.     Return office visit   3 months                   Told patient to return for periodic foot care and evaluation due to potential at risk complications.   Gardiner Barefoot DPM

## 2022-06-22 ENCOUNTER — Telehealth (HOSPITAL_COMMUNITY): Payer: Self-pay

## 2022-06-22 NOTE — Telephone Encounter (Signed)
Called to confirm appt. Pt confirmed appt. Instructed pt on proper footwear and COVID screening. Gave directions along with department number.   

## 2022-06-23 ENCOUNTER — Encounter (HOSPITAL_COMMUNITY)
Admission: RE | Admit: 2022-06-23 | Discharge: 2022-06-23 | Disposition: A | Discharge status: Home / Self Care | Payer: Medicare HMO | Source: Ambulatory Visit | Attending: Student | Admitting: Student

## 2022-06-23 ENCOUNTER — Encounter (HOSPITAL_COMMUNITY): Payer: Self-pay

## 2022-06-23 VITALS — BP 120/50 | HR 59 | Ht 67.5 in | Wt 199.1 lb

## 2022-06-23 DIAGNOSIS — J449 Chronic obstructive pulmonary disease, unspecified: Secondary | ICD-10-CM | POA: Diagnosis not present

## 2022-06-23 NOTE — Progress Notes (Addendum)
Diane [name] 82 y.o. female  Pulmonary Rehab Orientation Note  This patient who was referred to Pulmonary Rehab by Dr. Verlee Monte with the diagnosis of COPD I arrived today in Cardiac and Pulmonary Rehab. She  arrived ambulatory with normal gait. She  does not carry portable oxygen. Color good, skin warm and dry. Patient is oriented to time and place. Patient's medical history, psychosocial health, and medications reviewed.   Psychosocial assessment reveals patient lives alone. Diane is currently retired. Patient hobbies include watching tv and reading. Patient reports her stress level is low. Areas of stress/anxiety include family . Pt does not exhibit signs of depression. PHQ2/9 score 1/2. Diane shows good  coping skills with positive outlook on life. Offered emotional support and reassurance. We will continue to monitor and evaluate Diane [name]'s psychosocial barriers and mental health while attending pulm rehab.  Physical assessment reveals patient is alert and oriented x 4.  Heart rate is normal, breath sounds are diminished in all lobes. Reports non-productive cough. Endorses hoarseness in voice, throat clearing, and noting sometimes it is hard to swallow. Patient is being followed by ENT. Bowel sounds present. Pt denies abdominal discomfort, nausea, vomiting or diarrhea. Grip strength equal, strong. Distal pulses +2; no swelling to lower extremities.   Diane reports she does take medications as prescribed. Patient states she follows a regular  diet. The patient reports no specific efforts to gain or lose weight.. Pt's weight will be monitored closely.   Demonstration and practice of PLB using pulse oximeter. Diane able to return demonstration satisfactorily. Safety and hand hygiene in the exercise area reviewed with patient. Diane voices understanding of the information reviewed. Department expectations discussed with patient and achievable goals were set. The patient shows enthusiasm  about attending the program and we look forward to working with Diane. Diane completed a 6 min walk test today and is scheduled to begin exercise on 06/29/2022 at 1315.   [id number] Diane Ores, RN, BSN

## 2022-06-23 NOTE — Progress Notes (Signed)
Pulmonary Individual Treatment Plan  Patient Details  Name: Diane [name] MRN: [id number] Date of Birth: December 03, 1940 Referring Provider:   [name] Pulmonary Rehab Walk Test from 06/23/2022 in [facility] [place]  Referring Provider Meier       Initial Encounter Date:  Flowsheet Row Pulmonary Rehab Walk Test from 06/23/2022 in [facility] [place]  Date 06/23/22       Visit Diagnosis: Chronic obstructive pulmonary disease, unspecified COPD type ([place])  Patient's Home Medications on Admission:   Current Outpatient Medications:    acetaminophen (TYLENOL) 500 MG tablet, Take 500 mg by mouth every 6 (six) hours as needed (for pain.)., Disp: , Rfl:    albuterol (VENTOLIN HFA) 108 (90 Base) MCG/ACT inhaler, Inhale 2 puffs into the lungs every 6 (six) hours as needed for wheezing or shortness of breath., Disp: , Rfl:    amiodarone (PACERONE) 200 MG tablet, Take 100 mg by mouth every other day., Disp: , Rfl:    amoxicillin (AMOXIL) 500 MG capsule, Take 2,000 mg by mouth as needed (dental appointment)., Disp: , Rfl:    aspirin EC 81 MG tablet, Take 81 mg by mouth daily., Disp: , Rfl:    busPIRone (BUSPAR) 5 MG tablet, Take 5 mg by mouth 2 (two) times daily., Disp: , Rfl:    co-enzyme Q-10 30 MG capsule, Take 30 mg by mouth daily., Disp: , Rfl:    ELIQUIS 2.5 MG TABS tablet, Take 2.5 mg by mouth 2 (two) times daily., Disp: , Rfl:    ferrous sulfate 325 (65 FE) MG EC tablet, Take 325 mg by mouth once a week. Thursday's, Disp: , Rfl:    Fluticasone-Umeclidin-Vilant (TRELEGY ELLIPTA) 100-62.5-25 MCG/ACT AEPB, Inhale 1 puff into the lungs daily., Disp: 1 each, Rfl: 11   furosemide (LASIX) 40 MG tablet, Take 1 tablet (40 mg total) by mouth daily., Disp: 30 tablet, Rfl: 1   gabapentin (NEURONTIN) 300 MG capsule, Take 300 mg by mouth at bedtime. , Disp: , Rfl:    ketoconazole (NIZORAL) 2  % cream, Apply topically daily., Disp: , Rfl:    Lancets (ONETOUCH DELICA PLUS [id number]) MISC, USE AS DIRECTED DAILY FOR 30 DAYS, Disp: , Rfl:    loratadine (CLARITIN) 10 MG tablet, Take 10 mg by mouth daily as needed for allergies., Disp: , Rfl:    Multiple Vitamins-Minerals (CENTRUM SILVER 50+WOMEN) TABS, Take 1 tablet by mouth daily., Disp: , Rfl:    nitroGLYCERIN (NITROSTAT) 0.4 MG SL tablet, Place 0.4 mg under the tongue every 5 (five) minutes x 3 doses as needed for chest pain., Disp: , Rfl:    ONETOUCH VERIO test strip, FOR USE WHEN CHECKING BLOOD GLUCOSE ONCE DAILY ALTERNATING AM AND PM BEFORE MEALS FINGER STICK 90 DAYS, Disp: , Rfl: 3   pantoprazole (PROTONIX) 40 MG tablet, Take 1 tablet (40 mg total) by mouth daily., Disp: 90 tablet, Rfl: 0   pravastatin (PRAVACHOL) 10 MG tablet, Take 10 mg by mouth at bedtime., Disp: , Rfl:    spironolactone (ALDACTONE) 25 MG tablet, Take 12.5 mg by mouth every morning., Disp: , Rfl:    FLUoxetine (PROZAC) 10 MG capsule, Take 10 mg by mouth daily. (Patient not taking: Reported on 06/23/2022), Disp: , Rfl:    Fluticasone-Umeclidin-Vilant (TRELEGY ELLIPTA) 200-62.5-25 MCG/ACT AEPB, Inhale 1 puff into the lungs daily. (Patient not taking: Reported on 06/23/2022), Disp: , Rfl:   Past Medical History: Past  Medical History:  Diagnosis Date   Anxiety    Arthritis    back   Atrial fibrillation ([place])    Breast cancer ([place])    right breast cancer - 3 years ago   Chronic diastolic heart failure (HCC)    ECHO: 06/20/2017 - Grade 2 Diastolic Dysfunction    Coronary artery disease    Depression    Diabetes mellitus without complication (HCC)    diet   Dysrhythmia    afib   Fibromyalgia    Herniated disc    Hyperlipemia    Hypertension    Iron deficiency anemia    Longstanding persistent atrial fibrillation (HCC)    Lower back pain    Lumbar stenosis    L4-5   Personal history of chemotherapy    Personal history of radiation therapy    Pulmonary  hypertension ([place])    S/P Maze operation for atrial fibrillation 05/09/2018   Complete bilateral atrial lesion set using bipolar radiofrequency and cryothermy ablation with clipping of LA appendage   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   Docetaxel/Cytoxan Q 3 Weeks x 4 cycles   Torn rotator cuff    Tricuspid regurgitation     Tobacco Use: Social History   Tobacco Use  Smoking Status Former   Years: 50.00   Types: Cigarettes   Quit date: 05/29/2009   Years since quitting: 13.0  Smokeless Tobacco Never    Labs: Review Flowsheet  More data exists      Latest Ref Rng & Units 05/15/2018 05/16/2018 05/17/2018 01/06/2019 01/31/2019  Labs for ITP Cardiac and Pulmonary Rehab  Hemoglobin A1c 4.8 - 5.6 % - - - 6.1  6.3   O2 Saturation % 50.7  64.6  62.6  67.9  - -    Capillary Blood Glucose: Lab Results  Component Value Date   GLUCAP 90 02/06/2019   GLUCAP 84 02/06/2019   GLUCAP 81 01/31/2019   GLUCAP 92 05/20/2018   GLUCAP 89 05/20/2018     Pulmonary Assessment Scores:  Pulmonary Assessment Scores     Row Name 06/23/22 1354         ADL UCSD   SOB Score total 49       CAT Score   CAT Score 22       mMRC Score   mMRC Score 3             UCSD: Self-administered rating of dyspnea associated with activities of daily living (ADLs) 6-point scale (0 = "not at all" to 5 = "maximal or unable to do because of breathlessness")  Scoring Scores range from 0 to 120.  Minimally important difference is 5 units  CAT: CAT can identify the health impairment of COPD  patients and is better correlated with disease progression.  CAT has a scoring range of zero to 40. The CAT score is classified into four groups of low (less than 10), medium (10 - 20), high (21-30) and very high (31-40) based on the impact level  of disease on health status. A CAT score over 10 suggests significant symptoms.  A worsening CAT score could be explained by an exacerbation, poor medication adherence, poor inhaler technique, or progression of COPD or comorbid conditions.  CAT MCID is 2 points  mMRC: mMRC (Modified Medical Research Council) Dyspnea Scale is used to assess the degree of baseline functional disability in patients of respiratory disease due to dyspnea. No minimal important difference is established. A decrease in score of 1 point or greater is considered a positive change.   Pulmonary Function Assessment:  Pulmonary Function Assessment - 06/23/22 1353       Breath   Bilateral Breath Sounds Decreased    Shortness of Breath Yes;Fear of Shortness of Breath;Limiting activity;Panic with Shortness of Breath             Exercise Target Goals: Exercise Program Goal: Individual exercise prescription set using results from initial 6 min walk test and THRR while considering  patient's activity barriers and safety.   Exercise Prescription Goal: Initial exercise prescription builds to 30-45 minutes a day of aerobic activity, 2-3 days per week.  Home exercise guidelines will be given to patient during program as part of exercise prescription that the participant will acknowledge.  Activity Barriers & Risk Stratification:  Activity Barriers & Cardiac Risk Stratification - 06/23/22 1351       Activity Barriers & Cardiac Risk Stratification   Activity Barriers Shortness of Breath;Muscular Weakness;Back Problems;Neck/Spine Problems;Arthritis;Deconditioning;History of Falls             6 Minute Walk:  6 Minute Walk     Row Name 06/23/22 1445         6 Minute Walk    Phase Initial     Distance 600 feet     Walk Time 9 minutes     # of Rest Breaks 2  1:45-2:32, 3:15-3:44     MPH 1.14     METS 0.95     RPE 11     Perceived Dyspnea  2     VO2 Peak 3.33     Symptoms Yes (comment)     Comments Rt hip pain chronic 5/10     Resting HR 59 bpm     Resting BP 120/50     Resting Oxygen Saturation  98 %     Exercise Oxygen Saturation  during 6 min walk 95 %     Max Ex. HR 82 bpm     Max Ex. BP 150/50     2 Minute Post BP 142/58       Interval HR   1 Minute HR 72     2 Minute HR 72     3 Minute HR 71     4 Minute HR 73     5 Minute HR 81     6 Minute HR 82     2 Minute Post HR 62     Interval Heart Rate? Yes       Interval Oxygen   Interval Oxygen? Yes     Baseline Oxygen Saturation % 98 %     1 Minute Oxygen Saturation % 95 %     1 Minute Liters of Oxygen 0 L     2 Minute Oxygen Saturation % 95 %     2 Minute Liters of Oxygen 0 L     3 Minute Oxygen Saturation % 96 %     3 Minute Liters of Oxygen 0 L  4 Minute Oxygen Saturation % 97 %     4 Minute Liters of Oxygen 0 L     5 Minute Oxygen Saturation % 98 %     5 Minute Liters of Oxygen 0 L     6 Minute Oxygen Saturation % 97 %     6 Minute Liters of Oxygen 0 L     2 Minute Post Oxygen Saturation % 98 %     2 Minute Post Liters of Oxygen 0 L              Oxygen Initial Assessment:  Oxygen Initial Assessment - 06/23/22 1352       Home Oxygen   Home Oxygen Device None    Sleep Oxygen Prescription None    Home Exercise Oxygen Prescription None    Home Resting Oxygen Prescription None      Initial 6 min Walk   Oxygen Used None      Program Oxygen Prescription   Program Oxygen Prescription None      Intervention   Short Term Goals To learn and understand importance of maintaining oxygen saturations>88%;To learn and demonstrate proper pursed lip breathing techniques or other breathing techniques. ;To learn and demonstrate proper use of respiratory medications    Long  Term  Goals Maintenance of O2 saturations>88%;Compliance with respiratory medication;Verbalizes importance of monitoring SPO2 with pulse oximeter and return demonstration;Exhibits proper breathing techniques, such as pursed lip breathing or other method taught during program session;Demonstrates proper use of MDI's             Oxygen Re-Evaluation:   Oxygen Discharge (Final Oxygen Re-Evaluation):   Initial Exercise Prescription:  Initial Exercise Prescription - 06/23/22 1400       Date of Initial Exercise RX and Referring Provider   Date 06/23/22    Referring Provider Meier    Expected Discharge Date 08/31/22      NuStep   Level 1    SPM 50    Minutes 30      Prescription Details   Frequency (times per week) 2    Duration Progress to 30 minutes of continuous aerobic without signs/symptoms of physical distress      Intensity   THRR 40-80% of Max Heartrate 56-113    Ratings of Perceived Exertion 11-13    Perceived Dyspnea 0-4      Progression   Progression Continue progressive overload as per policy without signs/symptoms or physical distress.      Resistance Training   Training Prescription Yes    Weight red bands    Reps 10-15             Perform Capillary Blood Glucose checks as needed.  Exercise Prescription Changes:   Exercise Comments:   Exercise Goals and Review:   Exercise Goals     Row Name 06/23/22 1352             Exercise Goals   Increase Physical Activity Yes       Intervention Provide advice, education, support and counseling about physical activity/exercise needs.;Develop an individualized exercise prescription for aerobic and resistive training based on initial evaluation findings, risk stratification, comorbidities and participant's personal goals.       Expected Outcomes Short Term: Attend rehab on a regular basis to increase amount of physical activity.;Long Term: Exercising regularly at least 3-5 days a week.;Long Term: Add in home  exercise to make exercise part of routine and to increase amount of physical activity.  Increase Strength and Stamina Yes       Intervention Provide advice, education, support and counseling about physical activity/exercise needs.;Develop an individualized exercise prescription for aerobic and resistive training based on initial evaluation findings, risk stratification, comorbidities and participant's personal goals.       Expected Outcomes Short Term: Increase workloads from initial exercise prescription for resistance, speed, and METs.;Short Term: Perform resistance training exercises routinely during rehab and add in resistance training at home;Long Term: Improve cardiorespiratory fitness, muscular endurance and strength as measured by increased METs and functional capacity (6MWT)       Able to understand and use rate of perceived exertion (RPE) scale Yes       Intervention Provide education and explanation on how to use RPE scale       Expected Outcomes Short Term: Able to use RPE daily in rehab to express subjective intensity level;Long Term:  Able to use RPE to guide intensity level when exercising independently       Able to understand and use Dyspnea scale Yes       Intervention Provide education and explanation on how to use Dyspnea scale       Expected Outcomes Short Term: Able to use Dyspnea scale daily in rehab to express subjective sense of shortness of breath during exertion;Long Term: Able to use Dyspnea scale to guide intensity level when exercising independently       Knowledge and understanding of Target Heart Rate Range (THRR) Yes       Intervention Provide education and explanation of THRR including how the numbers were predicted and where they are located for reference       Expected Outcomes Short Term: Able to state/look up THRR;Short Term: Able to use daily as guideline for intensity in rehab;Long Term: Able to use THRR to govern intensity when exercising independently        Understanding of Exercise Prescription Yes       Intervention Provide education, explanation, and written materials on patient's individual exercise prescription       Expected Outcomes Short Term: Able to explain program exercise prescription;Long Term: Able to explain home exercise prescription to exercise independently                Exercise Goals Re-Evaluation :   Discharge Exercise Prescription (Final Exercise Prescription Changes):   Nutrition:  Target Goals: Understanding of nutrition guidelines, daily intake of sodium '1500mg'$ , cholesterol '200mg'$ , calories 30% from fat and 7% or less from saturated fats, daily to have 5 or more servings of fruits and vegetables.  Biometrics:    Nutrition Therapy Plan and Nutrition Goals:   Nutrition Assessments:  MEDIFICTS Score Key: ?70 Need to make dietary changes  40-70 Heart Healthy Diet ? 40 Therapeutic Level Cholesterol Diet   Picture Your Plate Scores: <71 Unhealthy dietary pattern with much room for improvement. 41-50 Dietary pattern unlikely to meet recommendations for good health and room for improvement. 51-60 More healthful dietary pattern, with some room for improvement.  >60 Healthy dietary pattern, although there may be some specific behaviors that could be improved.    Nutrition Goals Re-Evaluation:   Nutrition Goals Discharge (Final Nutrition Goals Re-Evaluation):   Psychosocial: Target Goals: Acknowledge presence or absence of significant depression and/or stress, maximize coping skills, provide positive support system. Participant is able to verbalize types and ability to use techniques and skills needed for reducing stress and depression.  Initial Review & Psychosocial Screening:  Initial Psych Review & Screening - 06/23/22  1339       Initial Review   Current issues with Current Psychotropic Meds;Current Sleep Concerns    Source of Stress Concerns None Identified    Comments Daughter passes away  about a year ago, has a hard time staying a sleep thinks about kids      [place]? Yes    Concerns No support system    Comments great grandson, worries about kids, will she ever see or talk to them again      Barriers   Psychosocial barriers to participate in program The patient should benefit from training in stress management and relaxation.;Psychosocial barriers identified (see note)      Screening Interventions   Interventions Encouraged to exercise    Expected Outcomes Long Term goal: The participant improves quality of Life and PHQ9 Scores as seen by post scores and/or verbalization of changes;Short Term goal: Identification and review with participant of any Quality of Life or Depression concerns found by scoring the questionnaire.;Long Term Goal: Stressors or current issues are controlled or eliminated.             Quality of Life Scores:  Scores of 19 and below usually indicate a poorer quality of life in these areas.  A difference of  2-3 points is a clinically meaningful difference.  A difference of 2-3 points in the total score of the Quality of Life Index has been associated with significant improvement in overall quality of life, self-image, physical symptoms, and general health in studies assessing change in quality of life.  PHQ-9: Review Flowsheet       06/23/2022 06/03/2018 03/15/2018 11/11/2013  Depression screen PHQ 2/9  Decreased Interest 1 0 0 0  Down, Depressed, Hopeless 0 0 0 1  PHQ - 2 Score 1 0 0 1  Altered sleeping 0 - 3 -  Tired, decreased energy 1 - 3 -  Change in appetite 0 - 2 -  Feeling bad or failure about yourself  0 - 0 -  Trouble concentrating 0 - 0 -  Moving slowly or fidgety/restless 0 - 0 -  Suicidal thoughts 0 - 0 -  PHQ-9 Score 2 - 8 -  Difficult doing work/chores Not difficult at all - Not difficult at all -   Interpretation of Total Score  Total Score Depression Severity:  1-4 = Minimal depression, 5-9 =  Mild depression, 10-14 = Moderate depression, 15-19 = Moderately severe depression, 20-27 = Severe depression   Psychosocial Evaluation and Intervention:  Psychosocial Evaluation - 06/23/22 1348       Psychosocial Evaluation & Interventions   Interventions Encouraged to exercise with the program and follow exercise prescription    Comments Pt declines therapist referral    Continue Psychosocial Services  Follow up required by staff             Psychosocial Re-Evaluation:   Psychosocial Discharge (Final Psychosocial Re-Evaluation):   Education: Education Goals: Education classes will be provided on a weekly basis, covering required topics. Participant will state understanding/return demonstration of topics presented.  Learning Barriers/Preferences:  Learning Barriers/Preferences - 06/23/22 1349       Learning Barriers/Preferences   Learning Barriers None    Learning Preferences Group Instruction;Individual Instruction;Verbal Instruction;Written Material;Skilled Demonstration;Video;Audio             Education Topics: Introduction to Pulmonary Rehab Group instruction provided by PowerPoint, verbal discussion, and written material to support subject matter. Instructor reviews what Pulmonary Rehab is, the  purpose of the program, and how patients are referred.     Know Your Numbers Group instruction that is supported by a PowerPoint presentation. Instructor discusses importance of knowing and understanding resting, exercise, and post-exercise oxygen saturation, heart rate, and blood pressure. Oxygen saturation, heart rate, blood pressure, rating of perceived exertion, and dyspnea are reviewed along with a normal range for these values.    Exercise for the Pulmonary Patient Group instruction that is supported by a PowerPoint presentation. Instructor discusses benefits of exercise, core components of exercise, frequency, duration, and intensity of an exercise routine,  importance of utilizing pulse oximetry during exercise, safety while exercising, and options of places to exercise outside of rehab.       MET Level  Group instruction provided by PowerPoint, verbal discussion, and written material to support subject matter. Instructor reviews what METs are and how to increase METs.    Pulmonary Medications Verbally interactive group education provided by instructor with focus on inhaled medications and proper administration.   Anatomy and Physiology of the Respiratory System Group instruction provided by PowerPoint, verbal discussion, and written material to support subject matter. Instructor reviews respiratory cycle and anatomical components of the respiratory system and their functions. Instructor also reviews differences in obstructive and restrictive respiratory diseases with examples of each.    Oxygen Safety Group instruction provided by PowerPoint, verbal discussion, and written material to support subject matter. There is an overview of "What is Oxygen" and "Why do we need it".  Instructor also reviews how to create a safe environment for oxygen use, the importance of using oxygen as prescribed, and the risks of noncompliance. There is a brief discussion on traveling with oxygen and resources the patient may utilize.   Oxygen Use Group instruction provided by PowerPoint, verbal discussion, and written material to discuss how supplemental oxygen is prescribed and different types of oxygen supply systems. Resources for more information are provided.    Breathing Techniques Group instruction that is supported by demonstration and informational handouts. Instructor discusses the benefits of pursed lip and diaphragmatic breathing and detailed demonstration on how to perform both.     Risk Factor Reduction Group instruction that is supported by a PowerPoint presentation. Instructor discusses the definition of a risk factor, different risk factors  for pulmonary disease, and how the heart and lungs work together.   MD Day A group question and answer session with a medical doctor that allows participants to ask questions that relate to their pulmonary disease state.   Nutrition for the Pulmonary Patient Group instruction provided by PowerPoint slides, verbal discussion, and written materials to support subject matter. The instructor gives an explanation and review of healthy diet recommendations, which includes a discussion on weight management, recommendations for fruit and vegetable consumption, as well as protein, fluid, caffeine, fiber, sodium, sugar, and alcohol. Tips for eating when patients are short of breath are discussed.    Other Education Group or individual verbal, written, or video instructions that support the educational goals of the pulmonary rehab program.    Knowledge Questionnaire Score:  Knowledge Questionnaire Score - 06/23/22 1410       Knowledge Questionnaire Score   Pre Score 13/18             Core Components/Risk Factors/Patient Goals at Admission:  Personal Goals and Risk Factors at Admission - 06/23/22 1349       Core Components/Risk Factors/Patient Goals on Admission    Weight Management Weight Maintenance    Intervention Weight  Management: Provide education and appropriate resources to help participant work on and attain dietary goals.;Weight Management/Obesity: Establish reasonable short term and long term weight goals.    Admit Weight 199 lb (90.3 kg)    Expected Outcomes Weight Maintenance: Understanding of the daily nutrition guidelines, which includes 25-35% calories from fat, 7% or less cal from saturated fats, less than '200mg'$  cholesterol, less than 1.5gm of sodium, & 5 or more servings of fruits and vegetables daily    Improve shortness of breath with ADL's Yes    Intervention Provide education, individualized exercise plan and daily activity instruction to help decrease symptoms of SOB  with activities of daily living.    Expected Outcomes Short Term: Improve cardiorespiratory fitness to achieve a reduction of symptoms when performing ADLs    Increase knowledge of respiratory medications and ability to use respiratory devices properly  Yes    Intervention Provide education and demonstration as needed of appropriate use of medications, inhalers, and oxygen therapy.    Expected Outcomes Short Term: Achieves understanding of medications use. Understands that oxygen is a medication prescribed by physician. Demonstrates appropriate use of inhaler and oxygen therapy.;Long Term: Maintain appropriate use of medications, inhalers, and oxygen therapy.    Stress Yes    Intervention Offer individual and/or small group education and counseling on adjustment to heart disease, stress management and health-related lifestyle change. Teach and support self-help strategies.;Refer participants experiencing significant psychosocial distress to appropriate mental health specialists for further evaluation and treatment. When possible, include family members and significant others in education/counseling sessions.    Expected Outcomes Short Term: Participant demonstrates changes in health-related behavior, relaxation and other stress management skills, ability to obtain effective social support, and compliance with psychotropic medications if prescribed.;Long Term: Emotional wellbeing is indicated by absence of clinically significant psychosocial distress or social isolation.             Core Components/Risk Factors/Patient Goals Review:    Core Components/Risk Factors/Patient Goals at Discharge (Final Review):    ITP Comments: Dr. Rodman Pickle is Medical Director for Pulmonary Rehab at [facility].

## 2022-06-29 ENCOUNTER — Encounter (HOSPITAL_COMMUNITY)
Admission: RE | Admit: 2022-06-29 | Discharge: 2022-06-29 | Disposition: A | Discharge status: Home / Self Care | Payer: Medicare HMO | Source: Ambulatory Visit | Attending: Pulmonary Disease | Admitting: Pulmonary Disease

## 2022-06-29 DIAGNOSIS — J449 Chronic obstructive pulmonary disease, unspecified: Secondary | ICD-10-CM | POA: Insufficient documentation

## 2022-06-29 NOTE — Progress Notes (Signed)
Daily Session Note  Patient Details  Name: Diane [name] MRN: [id number] Date of Birth: 12-31-1940 Referring Provider:   [name] Pulmonary Rehab Walk Test from 06/23/2022 in [facility]  Referring Provider Meier       Encounter Date: 06/29/2022  Check In:  Session Check In - 06/29/22 1317       Check-In   Supervising physician immediately available to respond to emergencies [facility] MD immediately available    Physician(s) Mannam    Location [facility]    Staff Present Janine Ores, RN, BSN;Randi Reeve BS, ACSM-CEP, Exercise Physiologist;Kaylee Rosana Hoes, MS, ACSM-CEP, Exercise Physiologist;Other;Samantha [place], RD, LDN    Virtual Visit No    Medication changes reported     No    Fall or balance concerns reported    No    Tobacco Cessation No Change    Warm-up and Cool-down Performed as group-led instruction    Resistance Training Performed Yes    VAD Patient? No      Pain Assessment   Currently in Pain? No/denies    Pain Score 0-No pain             Capillary Blood Glucose: No results found for this or any previous visit (from the past 24 hour(s)).    Social History   Tobacco Use  Smoking Status Former   Years: 50.00   Types: Cigarettes   Quit date: 05/29/2009   Years since quitting: 13.0  Smokeless Tobacco Never    Goals Met:  Proper associated with RPD/PD & O2 Sat Independence with exercise equipment Exercise tolerated well No report of concerns or symptoms today Strength training completed today  Goals Unmet:  Not Applicable  Comments: Service time is from 1318 to 1446.    Dr. Rodman Pickle is Medical Director for Pulmonary Rehab at [facility].

## 2022-07-04 ENCOUNTER — Encounter (HOSPITAL_COMMUNITY)
Admission: RE | Admit: 2022-07-04 | Discharge: 2022-07-04 | Disposition: A | Discharge status: Home / Self Care | Payer: Medicare HMO | Source: Ambulatory Visit | Attending: Pulmonary Disease | Admitting: Pulmonary Disease

## 2022-07-04 DIAGNOSIS — J449 Chronic obstructive pulmonary disease, unspecified: Secondary | ICD-10-CM | POA: Diagnosis not present

## 2022-07-04 NOTE — Progress Notes (Signed)
Daily Session Note  Patient Details  Name: Diane [name] MRN: [id number] Date of Birth: 11/13/1940 Referring Provider:   [name] Pulmonary Rehab Walk Test from 06/23/2022 in [facility], [facility], [facility]  Referring Provider Meier       Encounter Date: 07/04/2022  Check In:  Session Check In - 07/04/22 1429       Check-In   Supervising physician immediately available to respond to emergencies [facility] MD immediately available    Physician(s) Clung    [facility]    Staff Present Janine Ores, RN, BSN;Randi Reeve BS, ACSM-CEP, Exercise Physiologist;Lujain Kraszewski Rosana Hoes, MS, ACSM-CEP, Exercise Physiologist;Other;Samantha [place], RD, LDN    Virtual Visit No    Medication changes reported     No    Fall or balance concerns reported    No    Tobacco Cessation No Change    Warm-up and Cool-down Performed as group-led instruction    Resistance Training Performed Yes    VAD Patient? No    PAD/SET Patient? No      Pain Assessment   Currently in Pain? No/denies    Pain Score 0-No pain    Multiple Pain Sites No             Capillary Blood Glucose: No results found for this or any previous visit (from the past 24 hour(s)).    Social History   Tobacco Use  Smoking Status Former   Years: 50.00   Types: Cigarettes   Quit date: 05/29/2009   Years since quitting: 13.1  Smokeless Tobacco Never    Goals Met:  Proper associated with RPD/PD & O2 Sat Exercise tolerated well No report of concerns or symptoms today Strength training completed today  Goals Unmet:  Not Applicable  Comments: Service time is from 1315 to 1450.    Dr. Rodman Pickle is Medical Director for Pulmonary Rehab at [facility].

## 2022-07-06 ENCOUNTER — Encounter (HOSPITAL_COMMUNITY): Payer: Medicare HMO

## 2022-07-11 ENCOUNTER — Encounter (HOSPITAL_COMMUNITY)
Admission: RE | Admit: 2022-07-11 | Discharge: 2022-07-11 | Disposition: A | Discharge status: Home / Self Care | Payer: Medicare HMO | Source: Ambulatory Visit | Attending: Pulmonary Disease | Admitting: Pulmonary Disease

## 2022-07-11 ENCOUNTER — Encounter (HOSPITAL_COMMUNITY): Payer: Self-pay

## 2022-07-11 VITALS — Wt 198.0 lb

## 2022-07-11 DIAGNOSIS — J449 Chronic obstructive pulmonary disease, unspecified: Secondary | ICD-10-CM | POA: Diagnosis not present

## 2022-07-11 NOTE — Progress Notes (Signed)
Daily Session Note  Patient Details  Name: Diane [name] MRN: [id number] Date of Birth: Jan 30, 1941 Referring Provider:   [name] Pulmonary Rehab Walk Test from 06/23/2022 in [facility] for Heart, Vascular, & [place]  Referring Provider Meier       Encounter Date: 07/11/2022  Check In:  Session Check In - 07/11/22 [place]       Check-In   Supervising physician immediately available to respond to emergencies [facility] MD immediately available    Physician(s) Sharolyn Douglas    [facility]    Staff Present Janine Ores, RN, BSN;Randi Olen Cordial BS, ACSM-CEP, Exercise Physiologist;Kaylee Rosana Hoes, MS, ACSM-CEP, Exercise Physiologist;Carlette Wilber Oliphant, RN, BSN    Virtual Visit No    Medication changes reported     No    Fall or balance concerns reported    No    Tobacco Cessation No Change    Warm-up and Cool-down Performed as group-led instruction    Resistance Training Performed Yes    VAD Patient? No    PAD/SET Patient? No      Pain Assessment   Currently in Pain? No/denies    Pain Score 0-No pain    Multiple Pain Sites No             Capillary Blood Glucose: No results found for this or any previous visit (from the past 24 hour(s)).   Exercise Prescription Changes - 07/11/22 1600       Response to Exercise   Blood Pressure (Admit) 128/56    Blood Pressure (Exercise) 140/58    Blood Pressure (Exit) 122/60    Heart Rate (Admit) 66 bpm    Heart Rate (Exercise) 77 bpm    Heart Rate (Exit) 65 bpm    Oxygen Saturation (Admit) 96 %    Oxygen Saturation (Exercise) 94 %    Oxygen Saturation (Exit) 94 %    Rating of Perceived Exertion (Exercise) 9    Perceived Dyspnea (Exercise) 1    Duration Continue with 30 min of aerobic exercise without signs/symptoms of physical distress.    Intensity THRR unchanged      Progression   Progression Continue to progress workloads to maintain intensity without signs/symptoms of physical  distress.      Resistance Training   Training Prescription Yes    Weight red bands    Reps 10-15    Time 10 Minutes      Interval Training   Interval Training No      NuStep   Level 1    SPM 50    Minutes 30    METs 2             Social History   Tobacco Use  Smoking Status Former   Years: 50.00   Types: Cigarettes   Quit date: 05/29/2009   Years since quitting: 13.1  Smokeless Tobacco Never    Goals Met:  Independence with exercise equipment Exercise tolerated well No report of concerns or symptoms today Strength training completed today  Goals Unmet:  Not Applicable  Comments: Service time is from 1306 to 1455    Dr. Rodman Pickle is Medical Director for Pulmonary Rehab at [facility]

## 2022-07-12 NOTE — Progress Notes (Signed)
Pulmonary Individual Treatment Plan  Patient Details  Name: Diane [name] MRN: [id number] Date of Birth: [birth year] Referring Provider:   [name] Pulmonary Rehab Walk Test from 06/23/2022 in [facility] for Heart, Vascular, & [place]  Referring Provider Meier       Initial Encounter Date:  Flowsheet Row Pulmonary Rehab Walk Test from 06/23/2022 in [facility] for Heart, Vascular, & [place]  Date 06/23/22       Visit Diagnosis: Chronic obstructive pulmonary disease, unspecified COPD type ([place])  Patient's Home Medications on Admission:   Current Outpatient Medications:    acetaminophen (TYLENOL) 500 MG tablet, Take 500 mg by mouth every 6 (six) hours as needed (for pain.)., Disp: , Rfl:    albuterol (VENTOLIN HFA) 108 (90 Base) MCG/ACT inhaler, Inhale 2 puffs into the lungs every 6 (six) hours as needed for wheezing or shortness of breath., Disp: , Rfl:    amiodarone (PACERONE) 200 MG tablet, Take 100 mg by mouth every other day., Disp: , Rfl:    amoxicillin (AMOXIL) 500 MG capsule, Take 2,000 mg by mouth as needed (dental appointment)., Disp: , Rfl:    aspirin EC 81 MG tablet, Take 81 mg by mouth daily., Disp: , Rfl:    busPIRone (BUSPAR) 5 MG tablet, Take 5 mg by mouth 2 (two) times daily., Disp: , Rfl:    co-enzyme Q-10 30 MG capsule, Take 30 mg by mouth daily., Disp: , Rfl:    ELIQUIS 2.5 MG TABS tablet, Take 2.5 mg by mouth 2 (two) times daily., Disp: , Rfl:    ferrous sulfate 325 (65 FE) MG EC tablet, Take 325 mg by mouth once a week. Thursday's, Disp: , Rfl:    FLUoxetine (PROZAC) 10 MG capsule, Take 10 mg by mouth daily. (Patient not taking: Reported on 06/23/2022), Disp: , Rfl:    Fluticasone-Umeclidin-Vilant (TRELEGY ELLIPTA) 100-62.5-25 MCG/ACT AEPB, Inhale 1 puff into the lungs daily., Disp: 1 each, Rfl: 11   Fluticasone-Umeclidin-Vilant (TRELEGY ELLIPTA) 200-62.5-25 MCG/ACT AEPB, Inhale 1 puff into the  lungs daily. (Patient not taking: Reported on 06/23/2022), Disp: , Rfl:    furosemide (LASIX) 40 MG tablet, Take 1 tablet (40 mg total) by mouth daily., Disp: 30 tablet, Rfl: 1   gabapentin (NEURONTIN) 300 MG capsule, Take 300 mg by mouth at bedtime. , Disp: , Rfl:    ketoconazole (NIZORAL) 2 % cream, Apply topically daily., Disp: , Rfl:    Lancets (ONETOUCH DELICA PLUS 123XX123) MISC, USE AS DIRECTED DAILY FOR 30 DAYS, Disp: , Rfl:    loratadine (CLARITIN) 10 MG tablet, Take 10 mg by mouth daily as needed for allergies., Disp: , Rfl:    Multiple Vitamins-Minerals (CENTRUM SILVER 50+WOMEN) TABS, Take 1 tablet by mouth daily., Disp: , Rfl:    nitroGLYCERIN (NITROSTAT) 0.4 MG SL tablet, Place 0.4 mg under the tongue every 5 (five) minutes x 3 doses as needed for chest pain., Disp: , Rfl:    ONETOUCH VERIO test strip, FOR USE WHEN CHECKING BLOOD GLUCOSE ONCE DAILY ALTERNATING AM AND PM BEFORE MEALS FINGER STICK 90 DAYS, Disp: , Rfl: 3   pantoprazole (PROTONIX) 40 MG tablet, Take 1 tablet (40 mg total) by mouth daily., Disp: 90 tablet, Rfl: 0   pravastatin (PRAVACHOL) 10 MG tablet, Take 10 mg by mouth at bedtime., Disp: , Rfl:    spironolactone (ALDACTONE) 25 MG tablet, Take 12.5 mg by mouth every morning., Disp: , Rfl:   Past Medical History: Past  Medical History:  Diagnosis Date   Anxiety    Arthritis    back   Atrial fibrillation ([place])    Breast cancer ([place])    right breast cancer - 3 years ago   Chronic diastolic heart failure (HCC)    ECHO: 06/20/2017 - Grade 2 Diastolic Dysfunction    Coronary artery disease    Depression    Diabetes mellitus without complication (HCC)    diet   Dysrhythmia    afib   Fibromyalgia    Herniated disc    Hyperlipemia    Hypertension    Iron deficiency anemia    Longstanding persistent atrial fibrillation (HCC)    Lower back pain    Lumbar stenosis    L4-5   Personal history of chemotherapy    Personal history of radiation therapy    Pulmonary  hypertension ([place])    S/P Maze operation for atrial fibrillation 05/09/2018   Complete bilateral atrial lesion set using bipolar radiofrequency and cryothermy ablation with clipping of LA appendage   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   Docetaxel/Cytoxan Q 3 Weeks x 4 cycles   Torn rotator cuff    Tricuspid regurgitation     Tobacco Use: Social History   Tobacco Use  Smoking Status Former   Years: 50.00   Types: Cigarettes   Quit date: 05/29/2009   Years since quitting: 13.1  Smokeless Tobacco Never    Labs: Review Flowsheet  More data exists      Latest Ref Rng & Units 05/15/2018 05/16/2018 05/17/2018 01/06/2019 01/31/2019  Labs for ITP Cardiac and Pulmonary Rehab  Hemoglobin A1c 4.8 - 5.6 % - - - 6.1  6.3   O2 Saturation % 50.7  64.6  62.6  67.9  - -    Capillary Blood Glucose: Lab Results  Component Value Date   GLUCAP 90 02/06/2019   GLUCAP 84 02/06/2019   GLUCAP 81 01/31/2019   GLUCAP 92 05/20/2018   GLUCAP 89 05/20/2018     Pulmonary Assessment Scores:  Pulmonary Assessment Scores     Row Name 06/23/22 1354         ADL UCSD   SOB Score total 49       CAT Score   CAT Score 22       mMRC Score   mMRC Score 3             UCSD: Self-administered rating of dyspnea associated with activities of daily living (ADLs) 6-point scale (0 = "not at all" to 5 = "maximal or unable to do because of breathlessness")  Scoring Scores range from 0 to 120.  Minimally important difference is 5 units  CAT: CAT can identify the health impairment of COPD  patients and is better correlated with disease progression.  CAT has a scoring range of zero to 40. The CAT score is classified into four groups of low (less than 10), medium (10 - 20), high (21-30) and very high (31-40) based on the impact level  of disease on health status. A CAT score over 10 suggests significant symptoms.  A worsening CAT score could be explained by an exacerbation, poor medication adherence, poor inhaler technique, or progression of COPD or comorbid conditions.  CAT MCID is 2 points  mMRC: mMRC (Modified Medical Research Council) Dyspnea Scale is used to assess the degree of baseline functional disability in patients of respiratory disease due to dyspnea. No minimal important difference is established. A decrease in score of 1 point or greater is considered a positive change.   Pulmonary Function Assessment:  Pulmonary Function Assessment - 06/23/22 1353       Breath   Bilateral Breath Sounds Decreased    Shortness of Breath Yes;Fear of Shortness of Breath;Limiting activity;Panic with Shortness of Breath             Exercise Target Goals: Exercise Program Goal: Individual exercise prescription set using results from initial 6 min walk test and THRR while considering  patient's activity barriers and safety.   Exercise Prescription Goal: Initial exercise prescription builds to 30-45 minutes a day of aerobic activity, 2-3 days per week.  Home exercise guidelines will be given to patient during program as part of exercise prescription that the participant will acknowledge.  Activity Barriers & Risk Stratification:  Activity Barriers & Cardiac Risk Stratification - 06/23/22 1351       Activity Barriers & Cardiac Risk Stratification   Activity Barriers Shortness of Breath;Muscular Weakness;Back Problems;Neck/Spine Problems;Arthritis;Deconditioning;History of Falls             6 Minute Walk:  6 Minute Walk     Row Name 06/23/22 1445         6 Minute Walk    Phase Initial     Distance 600 feet     Walk Time 9 minutes     # of Rest Breaks 2  1:45-2:32, 3:15-3:44     MPH 1.14     METS 0.95     RPE 11     Perceived Dyspnea  2     VO2 Peak 3.33     Symptoms Yes (comment)     Comments Rt hip pain chronic 5/10     Resting HR 59 bpm     Resting BP 120/50     Resting Oxygen Saturation  98 %     Exercise Oxygen Saturation  during 6 min walk 95 %     Max Ex. HR 82 bpm     Max Ex. BP 150/50     2 Minute Post BP 142/58       Interval HR   1 Minute HR 72     2 Minute HR 72     3 Minute HR 71     4 Minute HR 73     5 Minute HR 81     6 Minute HR 82     2 Minute Post HR 62     Interval Heart Rate? Yes       Interval Oxygen   Interval Oxygen? Yes     Baseline Oxygen Saturation % 98 %     1 Minute Oxygen Saturation % 95 %     1 Minute Liters of Oxygen 0 L     2 Minute Oxygen Saturation % 95 %     2 Minute Liters of Oxygen 0 L     3 Minute Oxygen Saturation % 96 %     3 Minute Liters of Oxygen 0 L  4 Minute Oxygen Saturation % 97 %     4 Minute Liters of Oxygen 0 L     5 Minute Oxygen Saturation % 98 %     5 Minute Liters of Oxygen 0 L     6 Minute Oxygen Saturation % 97 %     6 Minute Liters of Oxygen 0 L     2 Minute Post Oxygen Saturation % 98 %     2 Minute Post Liters of Oxygen 0 L              Oxygen Initial Assessment:  Oxygen Initial Assessment - 06/23/22 1352       Home Oxygen   Home Oxygen Device None    Sleep Oxygen Prescription None    Home Exercise Oxygen Prescription None    Home Resting Oxygen Prescription None      Initial 6 min Walk   Oxygen Used None      Program Oxygen Prescription   Program Oxygen Prescription None      Intervention   Short Term Goals To learn and understand importance of maintaining oxygen saturations>88%;To learn and demonstrate proper pursed lip breathing techniques or other breathing techniques. ;To learn and demonstrate proper use of respiratory medications    Long  Term  Goals Maintenance of O2 saturations>88%;Compliance with respiratory medication;Verbalizes importance of monitoring SPO2 with pulse oximeter and return demonstration;Exhibits proper breathing techniques, such as pursed lip breathing or other method taught during program session;Demonstrates proper use of MDI's             Oxygen Re-Evaluation:  Oxygen Re-Evaluation     Row Name 07/11/22 1656             Program Oxygen Prescription   Program Oxygen Prescription None         Home Oxygen   Home Oxygen Device None       Sleep Oxygen Prescription None       Home Exercise Oxygen Prescription None       Home Resting Oxygen Prescription None         Goals/Expected Outcomes   Short Term Goals To learn and understand importance of maintaining oxygen saturations>88%;To learn and demonstrate proper pursed lip breathing techniques or other breathing techniques. ;To learn and demonstrate proper use of respiratory medications       Long  Term Goals Maintenance of O2 saturations>88%;Compliance with respiratory medication;Verbalizes importance of monitoring SPO2 with pulse oximeter and return demonstration;Exhibits proper breathing techniques, such as pursed lip breathing or other method taught during program session;Demonstrates proper use of MDI's       Goals/Expected Outcomes Compliance and understanding of oxygen saturation monitoring and breathing techniques to decrease shortness of breath                Oxygen Discharge (Final Oxygen Re-Evaluation):  Oxygen Re-Evaluation - 07/11/22 1656       Program Oxygen Prescription   Program Oxygen Prescription None      Home Oxygen   Home Oxygen Device None    Sleep Oxygen Prescription None    Home Exercise Oxygen Prescription None    Home Resting Oxygen Prescription None      Goals/Expected Outcomes   Short Term Goals To learn and understand importance of maintaining oxygen saturations>88%;To learn and demonstrate proper pursed lip  breathing techniques or other breathing techniques. ;To learn and demonstrate proper use of respiratory medications    Long  Term Goals Maintenance of O2 saturations>88%;Compliance with respiratory  medication;Verbalizes importance of monitoring SPO2 with pulse oximeter and return demonstration;Exhibits proper breathing techniques, such as pursed lip breathing or other method taught during program session;Demonstrates proper use of MDI's    Goals/Expected Outcomes Compliance and understanding of oxygen saturation monitoring and breathing techniques to decrease shortness of breath             Initial Exercise Prescription:  Initial Exercise Prescription - 06/23/22 1400       Date of Initial Exercise RX and Referring Provider   Date 06/23/22    Referring Provider Meier    Expected Discharge Date 08/31/22      NuStep   Level 1    SPM 50    Minutes 30      Prescription Details   Frequency (times per week) 2    Duration Progress to 30 minutes of continuous aerobic without signs/symptoms of physical distress      Intensity   THRR 40-80% of Max Heartrate 56-113    Ratings of Perceived Exertion 11-13    Perceived Dyspnea 0-4      Progression   Progression Continue progressive overload as per policy without signs/symptoms or physical distress.      Resistance Training   Training Prescription Yes    Weight red bands    Reps 10-15             Perform Capillary Blood Glucose checks as needed.  Exercise Prescription Changes:   Exercise Prescription Changes     Row Name 07/11/22 1600             Response to Exercise   Blood Pressure (Admit) 128/56       Blood Pressure (Exercise) 140/58       Blood Pressure (Exit) 122/60       Heart Rate (Admit) 66 bpm       Heart Rate (Exercise) 77 bpm       Heart Rate (Exit) 65 bpm       Oxygen Saturation (Admit) 96 %       Oxygen Saturation (Exercise) 94 %       Oxygen Saturation (Exit) 94 %       Rating of Perceived Exertion  (Exercise) 9       Perceived Dyspnea (Exercise) 1       Duration Continue with 30 min of aerobic exercise without signs/symptoms of physical distress.       Intensity THRR unchanged         Progression   Progression Continue to progress workloads to maintain intensity without signs/symptoms of physical distress.         Resistance Training   Training Prescription Yes       Weight red bands       Reps 10-15       Time 10 Minutes         Interval Training   Interval Training No         NuStep   Level 1       SPM 50       Minutes 30       METs 2                Exercise Comments:   Exercise Comments     Row Name 06/29/22 1549           Exercise Comments Pt completed first day of exercise. Pt exercised on recumbent stepper for 30 min, level 1, METs 1.7. Performed warmup and cooldown with verbal  cues. c/o left knee pain doing squats, will try seated leg raises next session. Discussed METs with fair reception.                Exercise Goals and Review:   Exercise Goals     Row Name 06/23/22 1352 07/11/22 1656           Exercise Goals   Increase Physical Activity Yes Yes      Intervention Provide advice, education, support and counseling about physical activity/exercise needs.;Develop an individualized exercise prescription for aerobic and resistive training based on initial evaluation findings, risk stratification, comorbidities and participant's personal goals. Provide advice, education, support and counseling about physical activity/exercise needs.;Develop an individualized exercise prescription for aerobic and resistive training based on initial evaluation findings, risk stratification, comorbidities and participant's personal goals.      Expected Outcomes Short Term: Attend rehab on a regular basis to increase amount of physical activity.;Long Term: Exercising regularly at least 3-5 days a week.;Long Term: Add in home exercise to make exercise part of routine and  to increase amount of physical activity. Short Term: Attend rehab on a regular basis to increase amount of physical activity.;Long Term: Exercising regularly at least 3-5 days a week.;Long Term: Add in home exercise to make exercise part of routine and to increase amount of physical activity.      Increase Strength and Stamina Yes Yes      Intervention Provide advice, education, support and counseling about physical activity/exercise needs.;Develop an individualized exercise prescription for aerobic and resistive training based on initial evaluation findings, risk stratification, comorbidities and participant's personal goals. Provide advice, education, support and counseling about physical activity/exercise needs.;Develop an individualized exercise prescription for aerobic and resistive training based on initial evaluation findings, risk stratification, comorbidities and participant's personal goals.      Expected Outcomes Short Term: Increase workloads from initial exercise prescription for resistance, speed, and METs.;Short Term: Perform resistance training exercises routinely during rehab and add in resistance training at home;Long Term: Improve cardiorespiratory fitness, muscular endurance and strength as measured by increased METs and functional capacity (6MWT) Short Term: Increase workloads from initial exercise prescription for resistance, speed, and METs.;Short Term: Perform resistance training exercises routinely during rehab and add in resistance training at home;Long Term: Improve cardiorespiratory fitness, muscular endurance and strength as measured by increased METs and functional capacity (6MWT)      Able to understand and use rate of perceived exertion (RPE) scale Yes Yes      Intervention Provide education and explanation on how to use RPE scale Provide education and explanation on how to use RPE scale      Expected Outcomes Short Term: Able to use RPE daily in rehab to express subjective  intensity level;Long Term:  Able to use RPE to guide intensity level when exercising independently Short Term: Able to use RPE daily in rehab to express subjective intensity level;Long Term:  Able to use RPE to guide intensity level when exercising independently      Able to understand and use Dyspnea scale Yes Yes      Intervention Provide education and explanation on how to use Dyspnea scale Provide education and explanation on how to use Dyspnea scale      Expected Outcomes Short Term: Able to use Dyspnea scale daily in rehab to express subjective sense of shortness of breath during exertion;Long Term: Able to use Dyspnea scale to guide intensity level when exercising independently Short Term: Able to use Dyspnea scale daily in  rehab to express subjective sense of shortness of breath during exertion;Long Term: Able to use Dyspnea scale to guide intensity level when exercising independently      Knowledge and understanding of Target Heart Rate Range (THRR) Yes Yes      Intervention Provide education and explanation of THRR including how the numbers were predicted and where they are located for reference Provide education and explanation of THRR including how the numbers were predicted and where they are located for reference      Expected Outcomes Short Term: Able to state/look up THRR;Short Term: Able to use daily as guideline for intensity in rehab;Long Term: Able to use THRR to govern intensity when exercising independently Short Term: Able to state/look up THRR;Short Term: Able to use daily as guideline for intensity in rehab;Long Term: Able to use THRR to govern intensity when exercising independently      Understanding of Exercise Prescription Yes Yes      Intervention Provide education, explanation, and written materials on patient's individual exercise prescription Provide education, explanation, and written materials on patient's individual exercise prescription      Expected Outcomes Short Term:  Able to explain program exercise prescription;Long Term: Able to explain home exercise prescription to exercise independently Short Term: Able to explain program exercise prescription;Long Term: Able to explain home exercise prescription to exercise independently               Exercise Goals Re-Evaluation :  Exercise Goals Re-Evaluation     Row Name 07/11/22 1656             Exercise Goal Re-Evaluation   Exercise Goals Review Increase Physical Activity;Able to understand and use Dyspnea scale;Understanding of Exercise Prescription;Increase Strength and Stamina;Knowledge and understanding of Target Heart Rate Range (THRR);Able to understand and use rate of perceived exertion (RPE) scale       Comments Pt has completed 3 days of exercise. Pt exercised on recumbent stepper for 30 min, level 1, METs 2.0.  Pt is now working on squats. She is beginning to slowly progress her exercise tolerance. will continue to monitor.       Expected Outcomes Through exercise at rehab and home, the patient will decrease shortness of breath with daily activities and feel confident in carrying out an exercise regimen at home.                Discharge Exercise Prescription (Final Exercise Prescription Changes):  Exercise Prescription Changes - 07/11/22 1600       Response to Exercise   Blood Pressure (Admit) 128/56    Blood Pressure (Exercise) 140/58    Blood Pressure (Exit) 122/60    Heart Rate (Admit) 66 bpm    Heart Rate (Exercise) 77 bpm    Heart Rate (Exit) 65 bpm    Oxygen Saturation (Admit) 96 %    Oxygen Saturation (Exercise) 94 %    Oxygen Saturation (Exit) 94 %    Rating of Perceived Exertion (Exercise) 9    Perceived Dyspnea (Exercise) 1    Duration Continue with 30 min of aerobic exercise without signs/symptoms of physical distress.    Intensity THRR unchanged      Progression   Progression Continue to progress workloads to maintain intensity without signs/symptoms of physical  distress.      Resistance Training   Training Prescription Yes    Weight red bands    Reps 10-15    Time 10 Minutes      Interval Training  Interval Training No      NuStep   Level 1    SPM 50    Minutes 30    METs 2             Nutrition:  Target Goals: Understanding of nutrition guidelines, daily intake of sodium <1571m, cholesterol <2029m calories 30% from fat and 7% or less from saturated fats, daily to have 5 or more servings of fruits and vegetables.  Biometrics:    Nutrition Therapy Plan and Nutrition Goals:  Nutrition Therapy & Goals - 06/29/22 1518       Nutrition Therapy   Diet Heart Healthy/Renal diet    Drug/Food Interactions Statins/Certain Fruits      Personal Nutrition Goals   Nutrition Goal Patient to improve diet quality by using the plate method as a daily guide for meal planning to include lean protein/plant protein, fruits, vegetables, whole grains, nonfat dairy as part of a balanced diet    Personal Goal #2 Patient to reduce sodium to <150072mer day    Comments Diane [name] normal appetite and maintains a typical eating routine/schedule. She reports eating a wide variety of fruits, vegetables, whole grains, and protein sources. 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 She reports upcoming follow-up appointment with nephrology on 2/22; she reports he has not recommended any dietary changes at previous appointments. Patient will continue to benefit from participation in pulmonary rehab for nutrition, exercise, and lifestyle modification.      Intervention Plan   Intervention Prescribe, educate and counsel regarding individualized specific dietary modifications aiming towards targeted core components such as weight, hypertension, lipid management, diabetes, heart failure and other comorbidities.;Nutrition handout(s) given to patient.    Expected  Outcomes Short Term Goal: Understand basic principles of dietary content, such as calories, fat, sodium, cholesterol and nutrients.;Long Term Goal: Adherence to prescribed nutrition plan.             Nutrition Assessments:  Nutrition Assessments - 06/30/22 0903       Rate Your Plate Scores   Pre Score 40            MEDIFICTS Score Key: ?70 Need to make dietary changes  40-70 Heart Healthy Diet ? 40 Therapeutic Level Cholesterol Diet  Flowsheet Row PULMONARY REHAB OTHER RESPIRATORY [facility]  Picture Your Plate Total Score on Admission 40      Picture Your Plate Scores: <[id number] dietary pattern with much room for improvement. 41-50 Dietary pattern unlikely to meet recommendations for good health and room for improvement. 51-60 More healthful dietary pattern, with some room for improvement.  >60 Healthy dietary pattern, although there may be some specific behaviors that could be improved.    Nutrition Goals Re-Evaluation:  Nutrition Goals Re-Evaluation     RowLookebame 06/29/22 1518             Goals   Current Weight 198 lb 3.1 oz (89.9 kg)       Comment GFR 20.7, Cr 2.21, A1c (from 2020) 6.3       Expected Outcome Diane [name] normal appetite and maintains a typical eating routine/schedule. She reports eating a wide variety of fruits, vegetables, whole grains, and protein sources. 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 She reports upcoming follow-up appointment with nephrology on 2/22; she reports he has not recommended any dietary changes at previous appointments. Patient will continue to benefit from participation in pulmonary rehab for nutrition, exercise, and lifestyle modification.                Nutrition Goals Discharge (Final Nutrition Goals Re-Evaluation):  Nutrition  Goals Re-Evaluation - 06/29/22 1518       Goals   Current Weight 198 lb 3.1 oz (89.9 kg)    Comment GFR 20.7, Cr 2.21, A1c (from 2020) 6.3    Expected Outcome Diane reports normal appetite and maintains a typical eating routine/schedule. She reports eating a wide variety of fruits, vegetables, whole grains, and protein sources. 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 She reports upcoming follow-up appointment with nephrology on 2/22; she reports he has not recommended any dietary changes at previous appointments. Patient will continue to benefit from participation in pulmonary rehab for nutrition, exercise, and lifestyle modification.             Psychosocial: Target Goals: Acknowledge presence or absence of significant depression and/or stress, maximize coping skills, provide positive support system. Participant is able to verbalize types and ability to use techniques and skills needed for reducing stress and depression.  Initial Review & Psychosocial Screening:  Initial Psych Review & Screening - 06/23/22 1339       Initial Review   Current issues with Current Psychotropic Meds;Current Sleep Concerns    Source of Stress Concerns None Identified    Comments Daughter passes away about a year ago, has a hard time staying a sleep thinks about kids      Angie? Yes    Concerns No support system    Comments great grandson, worries about kids, will she ever see or talk to them again      Barriers   Psychosocial barriers to participate in program The patient should benefit from training in stress management and relaxation.;Psychosocial barriers identified (see note)      Screening Interventions   Interventions Encouraged to exercise    Expected Outcomes Long Term goal: The participant improves quality of Life and PHQ9 Scores as seen by post scores and/or  verbalization of changes;Short Term goal: Identification and review with participant of any Quality of Life or Depression concerns found by scoring the questionnaire.;Long Term Goal: Stressors or current issues are controlled or eliminated.             Quality of Life Scores:  Scores of 19 and below usually indicate a poorer quality of life in these areas.  A difference of  2-3 points is a clinically meaningful difference.  A difference of 2-3 points in the total score of the Quality of Life Index has been associated with significant improvement in overall quality of life, self-image, physical symptoms, and general health in studies assessing change in quality of life.  PHQ-9: Review Flowsheet       06/23/2022 06/03/2018 03/15/2018 11/11/2013  Depression screen PHQ 2/9  Decreased Interest 1 0 0 0  Down, Depressed, Hopeless 0 0 0 1  PHQ - 2 Score 1 0 0 1  Altered sleeping 0 - 3 -  Tired, decreased energy 1 - 3 -  Change in appetite 0 - 2 -  Feeling bad or failure about yourself  0 - 0 -  Trouble concentrating 0 - 0 -  Moving slowly or fidgety/restless 0 - 0 -  Suicidal thoughts 0 - 0 -  PHQ-9 Score 2 - 8 -  Difficult doing work/chores Not difficult at all - Not difficult at all -   Interpretation of Total Score  Total Score Depression Severity:  1-4 = Minimal depression, 5-9 = Mild depression, 10-14 = Moderate depression, 15-19 = Moderately severe depression, 20-27 = Severe depression   Psychosocial Evaluation and Intervention:  Psychosocial Evaluation - 06/23/22 1348       Psychosocial Evaluation & Interventions   Interventions Encouraged to exercise with the program and follow exercise prescription    Comments Pt declines therapist referral    Continue Psychosocial Services  Follow up required by staff             Psychosocial Re-Evaluation:  Psychosocial Re-Evaluation     [place] Name 07/07/22 1643             Psychosocial Re-Evaluation   Current issues with  Current Stress Concerns;Current Sleep Concerns       Comments Diane finds herself lying in bed thinking about her daughter's passing, her children and grandchildren. She is currently under stress from her family dynamic situation. She is compliant with taking her psychotropic meds and declines a referral at this time for a therapist. We will continue to monitor and assess Diane for any needs.       Expected Outcomes For Diane to attend PR while decreasing her stress concerns, to display positive and healthy coping skills, and to obtain better sleep       Interventions Stress management education;Encouraged to attend Pulmonary Rehabilitation for the exercise;Relaxation education       Continue Psychosocial Services  Follow up required by staff                Psychosocial Discharge (Final Psychosocial Re-Evaluation):  Psychosocial Re-Evaluation - 07/07/22 1643       Psychosocial Re-Evaluation   Current issues with Current Stress Concerns;Current Sleep Concerns    Comments Diane finds herself lying in bed thinking about her daughter's passing, her children and grandchildren. She is currently under stress from her family dynamic situation. She is compliant with taking her psychotropic meds and declines a referral at this time for a therapist. We will continue to monitor and assess Diane for any needs.    Expected Outcomes For Diane to attend PR while decreasing her stress concerns, to display positive and healthy coping skills, and to obtain better sleep    Interventions Stress management education;Encouraged to attend Pulmonary Rehabilitation for the exercise;Relaxation education    Continue Psychosocial Services  Follow up required by staff             Education: Education Goals: Education classes will be provided on a weekly basis, covering required topics. Participant will state understanding/return demonstration of topics presented.  Learning Barriers/Preferences:   Learning Barriers/Preferences - 06/23/22 1349       Learning Barriers/Preferences   Learning Barriers None    Learning Preferences Group Instruction;Individual Instruction;Verbal Instruction;Written Material;Skilled Demonstration;Video;Audio             Education Topics: Introduction to Pulmonary Rehab Group instruction provided by PowerPoint, verbal discussion, and written material to support subject matter. Instructor reviews what Pulmonary Rehab is, the purpose of the program, and how patients are referred.     Know Your Numbers Group instruction that is supported by a PowerPoint presentation. Instructor discusses importance of knowing and  understanding resting, exercise, and post-exercise oxygen saturation, heart rate, and blood pressure. Oxygen saturation, heart rate, blood pressure, rating of perceived exertion, and dyspnea are reviewed along with a normal range for these values.    Exercise for the Pulmonary Patient Group instruction that is supported by a PowerPoint presentation. Instructor discusses benefits of exercise, core components of exercise, frequency, duration, and intensity of an exercise routine, importance of utilizing pulse oximetry during exercise, safety while exercising, and options of places to exercise outside of rehab.    MET Level  Group instruction provided by PowerPoint, verbal discussion, and written material to support subject matter. Instructor reviews what METs are and how to increase METs.  Flowsheet Row PULMONARY REHAB OTHER RESPIRATORY from 06/29/2022 in [facility] for Heart, Vascular, & [place]  Date 06/29/22  Educator EP  Instruction Review Code 1- Verbalizes Understanding       Pulmonary Medications Verbally interactive group education provided by instructor with focus on inhaled medications and proper administration.   Anatomy and Physiology of the Respiratory System Group instruction provided by PowerPoint,  verbal discussion, and written material to support subject matter. Instructor reviews respiratory cycle and anatomical components of the respiratory system and their functions. Instructor also reviews differences in obstructive and restrictive respiratory diseases with examples of each.    Oxygen Safety Group instruction provided by PowerPoint, verbal discussion, and written material to support subject matter. There is an overview of "What is Oxygen" and "Why do we need it".  Instructor also reviews how to create a safe environment for oxygen use, the importance of using oxygen as prescribed, and the risks of noncompliance. There is a brief discussion on traveling with oxygen and resources the patient may utilize.   Oxygen Use Group instruction provided by PowerPoint, verbal discussion, and written material to discuss how supplemental oxygen is prescribed and different types of oxygen supply systems. Resources for more information are provided.  Flowsheet Row PULMONARY REHAB OTHER RESPIRATORY from 06/29/2022 in [facility] for Heart, Vascular, & Lung Health  Date 06/29/22  Educator RT  Instruction Review Code 1- Verbalizes Understanding       Breathing Techniques Group instruction that is supported by demonstration and informational handouts. Instructor discusses the benefits of pursed lip and diaphragmatic breathing and detailed demonstration on how to perform both.     Risk Factor Reduction Group instruction that is supported by a PowerPoint presentation. Instructor discusses the definition of a risk factor, different risk factors for pulmonary disease, and how the heart and lungs work together.   MD Day A group question and answer session with a medical doctor that allows participants to ask questions that relate to their pulmonary disease state.   Nutrition for the Pulmonary Patient Group instruction provided by PowerPoint slides, verbal discussion, and written  materials to support subject matter. The instructor gives an explanation and review of healthy diet recommendations, which includes a discussion on weight management, recommendations for fruit and vegetable consumption, as well as protein, fluid, caffeine, fiber, sodium, sugar, and alcohol. Tips for eating when patients are short of breath are discussed.    Other Education Group or individual verbal, written, or video instructions that support the educational goals of the pulmonary rehab program.    Knowledge Questionnaire Score:  Knowledge Questionnaire Score - 06/23/22 1410       Knowledge Questionnaire Score   Pre Score 13/18             Core  Components/Risk Factors/Patient Goals at Admission:  Personal Goals and Risk Factors at Admission - 06/23/22 1349       Core Components/Risk Factors/Patient Goals on Admission    Weight Management Weight Maintenance    Intervention Weight Management: Provide education and appropriate resources to help participant work on and attain dietary goals.;Weight Management/Obesity: Establish reasonable short term and long term weight goals.    Admit Weight 199 lb (90.3 kg)    Expected Outcomes Weight Maintenance: Understanding of the daily nutrition guidelines, which includes 25-35% calories from fat, 7% or less cal from saturated fats, less than 294m cholesterol, less than 1.5gm of sodium, & 5 or more servings of fruits and vegetables daily    Improve shortness of breath with ADL's Yes    Intervention Provide education, individualized exercise plan and daily activity instruction to help decrease symptoms of SOB with activities of daily living.    Expected Outcomes Short Term: Improve cardiorespiratory fitness to achieve a reduction of symptoms when performing ADLs    Increase knowledge of respiratory medications and ability to use respiratory devices properly  Yes    Intervention Provide education and demonstration as needed of appropriate use of  medications, inhalers, and oxygen therapy.    Expected Outcomes Short Term: Achieves understanding of medications use. Understands that oxygen is a medication prescribed by physician. Demonstrates appropriate use of inhaler and oxygen therapy.;Long Term: Maintain appropriate use of medications, inhalers, and oxygen therapy.    Stress Yes    Intervention Offer individual and/or small group education and counseling on adjustment to heart disease, stress management and health-related lifestyle change. Teach and support self-help strategies.;Refer participants experiencing significant psychosocial distress to appropriate mental health specialists for further evaluation and treatment. When possible, include family members and significant others in education/counseling sessions.    Expected Outcomes Short Term: Participant demonstrates changes in health-related behavior, relaxation and other stress management skills, ability to obtain effective social support, and compliance with psychotropic medications if prescribed.;Long Term: Emotional wellbeing is indicated by absence of clinically significant psychosocial distress or social isolation.             Core Components/Risk Factors/Patient Goals Review:   Goals and Risk Factor Review     Row Name 07/07/22 1648             Core Components/Risk Factors/Patient Goals Review   Personal Goals Review Weight Management/Obesity;Improve shortness of breath with ADL's;Develop more efficient breathing techniques such as purse lipped breathing and diaphragmatic breathing and practicing self-pacing with activity.;Increase knowledge of respiratory medications and ability to use respiratory devices properly.;Stress;Lipids;Diabetes       Review [place] 2 [place] sessions so far. She is exercising on the NuStep for 30 minutes. It is too soon to evaluate [place] any progress. We will continue to support [place] reach her goals.       Expected Outcomes  See admission goals                Core Components/Risk Factors/Patient Goals at Discharge (Final Review):   Goals and Risk Factor Review - 07/07/22 1648       Core Components/Risk Factors/Patient Goals Review   Personal Goals Review Weight Management/Obesity;Improve shortness of breath with ADL's;Develop more efficient breathing techniques such as purse lipped breathing and diaphragmatic breathing and practicing self-pacing with activity.;Increase knowledge of respiratory medications and ability to use respiratory devices properly.;Stress;Lipids;Diabetes    Review [place] 2 [place] sessions so far. She is exercising on the  NuStep for 30 minutes. It is too soon to evaluate Diane for any progress. We will continue to support Diane to reach her goals.    Expected Outcomes See admission goals             ITP Comments:   Comments: Dr. Rodman Pickle is Medical Director for Pulmonary Rehab at [facility].

## 2022-07-13 ENCOUNTER — Encounter (HOSPITAL_COMMUNITY)
Admission: RE | Admit: 2022-07-13 | Discharge: 2022-07-13 | Disposition: A | Discharge status: Home / Self Care | Payer: Medicare HMO | Source: Ambulatory Visit | Attending: Pulmonary Disease | Admitting: Pulmonary Disease

## 2022-07-13 DIAGNOSIS — J449 Chronic obstructive pulmonary disease, unspecified: Secondary | ICD-10-CM

## 2022-07-13 NOTE — Progress Notes (Signed)
Daily Session Note  Patient Details  Name: Diane [name] MRN: [id number] Date of Birth: August 07, 1940 Referring Provider:   [name] Pulmonary Rehab Walk Test from 06/23/2022 in [facility] for Heart, Vascular, & [place]  Referring Provider Meier       Encounter Date: 07/13/2022  Check In:  Session Check In - 07/13/22 1421       Check-In   Supervising physician immediately available to respond to emergencies [facility] MD immediately available    Physician(s) Erin Hearing, NP    Location [facility]    Staff Present Janine Ores, RN, BSN;Randi Olen Cordial BS, ACSM-CEP, Exercise Physiologist;Kaylee Rosana Hoes, MS, ACSM-CEP, Exercise Physiologist;Carlette Wilber Oliphant, RN, BSN;Samantha [place], RD, LDN;Other    Virtual Visit No    Medication changes reported     No    Fall or balance concerns reported    No    Tobacco Cessation No Change    Warm-up and Cool-down Performed as group-led instruction    Resistance Training Performed Yes    VAD Patient? No    PAD/SET Patient? No      Pain Assessment   Currently in Pain? No/denies    Multiple Pain Sites No             Capillary Blood Glucose: No results found for this or any previous visit (from the past 24 hour(s)).    Social History   Tobacco Use  Smoking Status Former   Years: 50.00   Types: Cigarettes   Quit date: 05/29/2009   Years since quitting: 13.1  Smokeless Tobacco Never    Goals Met:  Proper associated with RPD/PD & O2 Sat Independence with exercise equipment Exercise tolerated well No report of concerns or symptoms today Strength training completed today  Goals Unmet:  Not Applicable  Comments: Service time is from 1311 to 1444.    Dr. Rodman Pickle is Medical Director for Pulmonary Rehab at [facility].

## 2022-07-18 ENCOUNTER — Encounter (HOSPITAL_COMMUNITY)
Admission: RE | Admit: 2022-07-18 | Discharge: 2022-07-18 | Disposition: A | Discharge status: Home / Self Care | Payer: Medicare HMO | Source: Ambulatory Visit | Attending: Pulmonary Disease | Admitting: Pulmonary Disease

## 2022-07-18 DIAGNOSIS — J449 Chronic obstructive pulmonary disease, unspecified: Secondary | ICD-10-CM | POA: Diagnosis not present

## 2022-07-18 NOTE — Progress Notes (Signed)
Daily Session Note  Patient Details  Name: Diane [name] MRN: [id number] Date of Birth: 06/08/1940 Referring Provider:   [name] Pulmonary Rehab Walk Test from 06/23/2022 in [facility] for Heart, Vascular, & [place]  Referring Provider Meier       Encounter Date: 07/18/2022  Check In:  Session Check In - 07/18/22 1431       Check-In   Supervising physician immediately available to respond to emergencies [facility] MD immediately available    Physician(s) Eric Form, NP    [facility]    Staff Present Janine Ores, RN, BSN;Randi Olen Cordial BS, ACSM-CEP, Exercise Physiologist;Kaylee Rosana Hoes, MS, ACSM-CEP, Exercise Physiologist;Carlette Wilber Oliphant, RN, BSN;Samantha [place], RD, LDN;Other    Virtual Visit No    Medication changes reported     No    Fall or balance concerns reported    Yes    Comments pt fell at home    Tobacco Cessation No Change    Warm-up and Cool-down Performed as group-led instruction    Resistance Training Performed Yes    VAD Patient? No    PAD/SET Patient? No      Pain Assessment   Currently in Pain? No/denies    Multiple Pain Sites No             Capillary Blood Glucose: No results found for this or any previous visit (from the past 24 hour(s)).    Social History   Tobacco Use  Smoking Status Former   Years: 50.00   Types: Cigarettes   Quit date: 05/29/2009   Years since quitting: 13.1  Smokeless Tobacco Never    Goals Met:  Proper associated with RPD/PD & O2 Sat Independence with exercise equipment Exercise tolerated well No report of concerns or symptoms today Strength training completed today  Goals Unmet:  Not Applicable  Comments: Service time is from 1318 to 1444.    Dr. Rodman Pickle is Medical Director for Pulmonary Rehab at [facility].

## 2022-07-20 ENCOUNTER — Encounter (HOSPITAL_COMMUNITY)
Admission: RE | Admit: 2022-07-20 | Discharge: 2022-07-20 | Disposition: A | Discharge status: Home / Self Care | Payer: Medicare HMO | Source: Ambulatory Visit | Attending: Pulmonary Disease | Admitting: Pulmonary Disease

## 2022-07-20 DIAGNOSIS — J449 Chronic obstructive pulmonary disease, unspecified: Secondary | ICD-10-CM

## 2022-07-20 NOTE — Progress Notes (Signed)
Daily Session Note  Patient Details  Name: Diane [name] MRN: [id number] Date of Birth: April 25, 1941 Referring Provider:   [name] Pulmonary Rehab Walk Test from 06/23/2022 in [facility] for Heart, Vascular, & Lung Health  Referring Provider Meier       Encounter Date: 07/20/2022  Check In:  Session Check In - 07/20/22 1338       Check-In   Supervising physician immediately available to respond to emergencies [facility] MD immediately available    Physician(s) Shalhoub    [facility]    Staff Present Janine Ores, RN, BSN;Adebayo Ensminger BS, ACSM-CEP, Exercise Physiologist;Kaylee Rosana Hoes, MS, ACSM-CEP, Exercise Physiologist;Samantha [place], RD, LDN;Other    Virtual Visit No    Medication changes reported     Yes    Comments oral iron 3x wk    Fall or balance concerns reported    No    Tobacco Cessation No Change    Warm-up and Cool-down Performed as group-led instruction    Resistance Training Performed Yes    VAD Patient? No    PAD/SET Patient? No      Pain Assessment   Currently in Pain? No/denies    Pain Score 0-No pain    Multiple Pain Sites No             Capillary Blood Glucose: No results found for this or any previous visit (from the past 24 hour(s)).   Exercise Prescription Changes - 07/20/22 1500       Home Exercise Plan   Plans to continue exercise at [facility] (comment)   gym   Frequency Add 1 additional day to program exercise sessions.    Initial Home Exercises Provided 07/20/22             Social History   Tobacco Use  Smoking Status Former   Years: 50.00   Types: Cigarettes   Quit date: 05/29/2009   Years since quitting: 13.1  Smokeless Tobacco Never    Goals Met:  Improved SOB with ADL's Exercise tolerated well No report of concerns or symptoms today Strength training completed today  Goals Unmet:  Not Applicable  Comments: Pt began 2 15 min sessions today.  Discussed home exercise. Pt is progressing. Service time is from 1315 to 1450.    Dr. Rodman Pickle is Medical Director for Pulmonary Rehab at [facility].

## 2022-07-20 NOTE — Progress Notes (Signed)
Home Exercise Prescription I have reviewed a Home Exercise Prescription with [place] Dry[place] Boss. She is not currently doing aerobic exercise. Discussed with pt going to gym that she is a member of and exercising on similar equipment as she does in [place]. Pt agreeable. Encouraged 30 min, adding atleast 1 day a week. The patient stated that their goals were to build up her leg strength to be able to walk again. We reviewed exercise guidelines, target heart rate during exercise, RPE Scale, weather conditions, endpoints for exercise, warmup and cool down. The patient is encouraged to come to me with any questions. I will continue to follow up with the patient to assist them with progression and safety.    Brianne Maina [place], [place], ACSM-CEP 07/20/2022 3:18 PM

## 2022-07-25 ENCOUNTER — Encounter (HOSPITAL_COMMUNITY)
Admission: RE | Admit: 2022-07-25 | Discharge: 2022-07-25 | Disposition: A | Discharge status: Home / Self Care | Payer: Medicare HMO | Source: Ambulatory Visit | Attending: Pulmonary Disease | Admitting: Pulmonary Disease

## 2022-07-25 VITALS — Wt 197.5 lb

## 2022-07-25 DIAGNOSIS — J449 Chronic obstructive pulmonary disease, unspecified: Secondary | ICD-10-CM | POA: Diagnosis not present

## 2022-07-25 NOTE — Progress Notes (Signed)
Daily Session Note  Patient Details  Name: [place] Drye Daywalt MRN: [id number] Date of Birth: 1941-01-06 Referring Provider:   [name] Pulmonary Rehab Walk Test from 06/23/2022 in [facility] for Heart, Vascular, & [place]  Referring Provider Meier       Encounter Date: 07/25/2022  Check In:  Session Check In - 07/25/22 1432       Check-In   Supervising physician immediately available to respond to emergencies [facility] MD immediately available    Physician(s) Melina Copa, PA    Location [facility]    Staff Present Janine Ores, RN, Quentin Ore, MS, ACSM-CEP, Exercise Physiologist;Randi Yevonne Pax, ACSM-CEP, Exercise Physiologist;Samantha [place], RD, LDN;Other;Bailey Pearline Cables, MS, Exercise Physiologist    Virtual Visit No    Medication changes reported     No    Fall or balance concerns reported    No    Tobacco Cessation No Change    Warm-up and Cool-down Performed as group-led instruction    Resistance Training Performed Yes    VAD Patient? No    PAD/SET Patient? No      Pain Assessment   Currently in Pain? No/denies    Multiple Pain Sites No             Capillary Blood Glucose: No results found for this or any previous visit (from the past 24 hour(s)).    Social History   Tobacco Use  Smoking Status Former   Years: 50.00   Types: Cigarettes   Quit date: 05/29/2009   Years since quitting: 13.1  Smokeless Tobacco Never    Goals Met:  Proper associated with RPD/PD & O2 Sat Independence with exercise equipment Exercise tolerated well No report of concerns or symptoms today Strength training completed today  Goals Unmet:  Not Applicable  Comments: Service time is from 1310 to 1444.    Dr. Rodman Pickle is Medical Director for Pulmonary Rehab at [facility].

## 2022-07-27 ENCOUNTER — Telehealth (HOSPITAL_COMMUNITY): Payer: Self-pay

## 2022-07-27 ENCOUNTER — Encounter (HOSPITAL_COMMUNITY)
Admission: RE | Admit: 2022-07-27 | Discharge: 2022-07-27 | Disposition: A | Discharge status: Home / Self Care | Payer: Medicare HMO | Source: Ambulatory Visit | Attending: Pulmonary Disease | Admitting: Pulmonary Disease

## 2022-07-27 DIAGNOSIS — J449 Chronic obstructive pulmonary disease, unspecified: Secondary | ICD-10-CM | POA: Diagnosis not present

## 2022-07-27 NOTE — Progress Notes (Signed)
Daily Session Note  Patient Details  Name: Diane [name] Tewell MRN: [id number] Date of Birth: 18-Dec-1940 Referring Provider:   [name] Pulmonary Rehab Walk Test from 06/23/2022 in [facility]  Referring Provider Meier       Encounter Date: 07/27/2022  Check In:  Session Check In - 07/27/22 1432       Check-In   Supervising physician immediately available to respond to emergencies [facility] MD immediately available    Physician(s) Dr Cyd Silence    Location [facility]    Staff Present Janine Ores, RN, Quentin Ore, MS, ACSM-CEP, Exercise Physiologist;Randi Yevonne Pax, ACSM-CEP, Exercise Physiologist;Samantha [place], RD, LDN;Other    Virtual Visit No    Medication changes reported     No    Fall or balance concerns reported    No    Tobacco Cessation No Change    Warm-up and Cool-down Performed as group-led instruction    Resistance Training Performed Yes    VAD Patient? No    PAD/SET Patient? No      Pain Assessment   Currently in Pain? No/denies    Multiple Pain Sites No             Capillary Blood Glucose: No results found for this or any previous visit (from the past 24 hour(s)).    Social History   Tobacco Use  Smoking Status Former   Years: 50.00   Types: Cigarettes   Quit date: 05/29/2009   Years since quitting: 13.1  Smokeless Tobacco Never    Goals Met:  Proper associated with RPD/PD & O2 Sat Independence with exercise equipment Exercise tolerated well No report of concerns or symptoms today Strength training completed today  Goals Unmet:  Not Applicable  Comments: Service time is from 1308 to 1443.    Dr. Rodman Pickle is Medical Director for Pulmonary Rehab at [facility].

## 2022-07-27 NOTE — Telephone Encounter (Signed)
Called pts primary physician office to have referral placed for the breast center of [facility] imaging per pts request. Referral was made.

## 2022-07-31 ENCOUNTER — Other Ambulatory Visit: Payer: Self-pay | Admitting: Internal Medicine

## 2022-07-31 DIAGNOSIS — I700 Atherosclerosis of aorta: Secondary | ICD-10-CM | POA: Diagnosis not present

## 2022-07-31 DIAGNOSIS — Z1331 Encounter for screening for depression: Secondary | ICD-10-CM | POA: Diagnosis not present

## 2022-07-31 DIAGNOSIS — J449 Chronic obstructive pulmonary disease, unspecified: Secondary | ICD-10-CM | POA: Diagnosis not present

## 2022-07-31 DIAGNOSIS — E1169 Type 2 diabetes mellitus with other specified complication: Secondary | ICD-10-CM | POA: Diagnosis not present

## 2022-07-31 DIAGNOSIS — Z0000 Encounter for general adult medical examination without abnormal findings: Secondary | ICD-10-CM | POA: Diagnosis not present

## 2022-07-31 DIAGNOSIS — E7800 Pure hypercholesterolemia, unspecified: Secondary | ICD-10-CM | POA: Diagnosis not present

## 2022-07-31 DIAGNOSIS — I5022 Chronic systolic (congestive) heart failure: Secondary | ICD-10-CM | POA: Diagnosis not present

## 2022-07-31 DIAGNOSIS — R234 Changes in skin texture: Secondary | ICD-10-CM

## 2022-07-31 DIAGNOSIS — D6869 Other thrombophilia: Secondary | ICD-10-CM | POA: Diagnosis not present

## 2022-07-31 DIAGNOSIS — I4819 Other persistent atrial fibrillation: Secondary | ICD-10-CM | POA: Diagnosis not present

## 2022-07-31 DIAGNOSIS — N184 Chronic kidney disease, stage 4 (severe): Secondary | ICD-10-CM | POA: Diagnosis not present

## 2022-07-31 DIAGNOSIS — E1122 Type 2 diabetes mellitus with diabetic chronic kidney disease: Secondary | ICD-10-CM | POA: Diagnosis not present

## 2022-08-01 ENCOUNTER — Encounter (HOSPITAL_COMMUNITY)
Admission: RE | Admit: 2022-08-01 | Discharge: 2022-08-01 | Disposition: A | Discharge status: Home / Self Care | Payer: Medicare HMO | Source: Ambulatory Visit | Attending: Pulmonary Disease | Admitting: Pulmonary Disease

## 2022-08-01 DIAGNOSIS — Z5189 Encounter for other specified aftercare: Secondary | ICD-10-CM | POA: Insufficient documentation

## 2022-08-01 DIAGNOSIS — J449 Chronic obstructive pulmonary disease, unspecified: Secondary | ICD-10-CM | POA: Insufficient documentation

## 2022-08-01 DIAGNOSIS — Z0000 Encounter for general adult medical examination without abnormal findings: Secondary | ICD-10-CM | POA: Diagnosis not present

## 2022-08-01 NOTE — Progress Notes (Signed)
Daily Session Note  Patient Details  Name: Diane [name] MRN: [id number] Date of Birth: December 02, 1940 Referring Provider:   [name] Pulmonary Rehab Walk Test from 06/23/2022 in [facility]  Referring Provider Meier       Encounter Date: 08/01/2022  Check In:  Session Check In - 08/01/22 1424       Check-In   Supervising physician immediately available to respond to emergencies [facility] MD immediately available    Physician(s) Dr Gala Romney    [facility]    Staff Present Janine Ores, RN, Quentin Ore, MS, ACSM-CEP, Exercise Physiologist;Leopold Smyers Yevonne Pax, ACSM-CEP, Exercise Physiologist;Samantha [place], RD, LDN;Other    Virtual Visit No    Medication changes reported     No    Fall or balance concerns reported    No    Tobacco Cessation No Change    Warm-up and Cool-down Performed as group-led instruction    Resistance Training Performed Yes    VAD Patient? No    PAD/SET Patient? No      Pain Assessment   Currently in Pain? No/denies    Multiple Pain Sites No             Capillary Blood Glucose: No results found for this or any previous visit (from the past 24 hour(s)).    Social History   Tobacco Use  Smoking Status Former   Years: 50.00   Types: Cigarettes   Quit date: 05/29/2009   Years since quitting: 13.1  Smokeless Tobacco Never    Goals Met:  Independence with exercise equipment Exercise tolerated well No report of concerns or symptoms today Strength training completed today  Goals Unmet:  Not Applicable  Comments: Service time is from 1303 to 1450.    Dr. Rodman Pickle is Medical Director for Pulmonary Rehab at [facility].

## 2022-08-02 ENCOUNTER — Inpatient Hospital Stay: Payer: Medicare HMO | Attending: Obstetrics & Gynecology | Admitting: Obstetrics & Gynecology

## 2022-08-02 ENCOUNTER — Other Ambulatory Visit: Payer: Self-pay

## 2022-08-02 ENCOUNTER — Other Ambulatory Visit: Payer: Self-pay | Admitting: Student

## 2022-08-02 ENCOUNTER — Encounter: Payer: Self-pay | Admitting: Obstetrics & Gynecology

## 2022-08-02 VITALS — BP 126/44 | HR 61 | Temp 98.3°F | Resp 16 | Ht 67.0 in | Wt 193.0 lb

## 2022-08-02 DIAGNOSIS — Z853 Personal history of malignant neoplasm of breast: Secondary | ICD-10-CM | POA: Diagnosis not present

## 2022-08-02 DIAGNOSIS — C519 Malignant neoplasm of vulva, unspecified: Secondary | ICD-10-CM

## 2022-08-02 DIAGNOSIS — Z8544 Personal history of malignant neoplasm of other female genital organs: Secondary | ICD-10-CM | POA: Diagnosis not present

## 2022-08-02 NOTE — Patient Instructions (Signed)
Return in 6 months

## 2022-08-02 NOTE — Assessment & Plan Note (Addendum)
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

## 2022-08-02 NOTE — Progress Notes (Addendum)
Follow Up Note: Gyn-Onc  Diane [name] 82 y.o. female  CC: She returns for a f/u visit   HPI: The oncology history was reviewed.  Interval History: She denies any new lesions, itching, leg/groin pain, urinary symptoms, leg swelling, weight loss or cough.   C/O recurrent "boils" involving the buttocks.  She also endorses a new palpable right breast lump. No associated nipple discharge.   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  A referral has been made by her PCP for further diagnostic evaluation.    Review of Systems  Review of Systems  Constitutional:  Negative for malaise/fatigue and weight loss.  Respiratory:  Negative for shortness of breath and wheezing.   Cardiovascular:  Negative for chest pain and leg swelling.  Gastrointestinal:  Negative for abdominal pain, blood in stool, constipation, nausea and vomiting.  Genitourinary:  Negative for dysuria, frequency, hematuria and urgency.  Musculoskeletal:  Negative for joint pain and myalgias.  Neurological:  Negative for weakness.  Psychiatric/Behavioral:  Negative for depression. The patient does not have insomnia.    Current medications, allergy, social history, past surgical history, past medical history, family history were all reviewed.    Vitals:  BP (!) 126/44 (BP Location: Right Arm, Patient Position: Sitting)   Pulse 61   Temp 98.3 F (36.8 C) (Oral)   Resp 16   Ht '5\' 7"'$  (1.702 m)   Wt 193 lb (87.5 kg)   SpO2 97%   BMI 30.23 kg/m    Physical Exam Exam conducted with a chaperone present.  Constitutional:      General: She is not in acute distress. Breasts RUQ:no discrete mass--firm area involving lumpectomy scar, minimal skin puckering Cardiovascular:     Rate and Rhythm: Normal rate and regular rhythm.  Pulmonary:     Effort: Pulmonary effort is normal.     Breath sounds: Normal breath sounds. No wheezing or rhonchi.  Abdominal:     Palpations: Abdomen is soft.      Tenderness: There is no abdominal tenderness. There is no right CVA tenderness or left CVA tenderness.     Hernia: No hernia is present.  Genitourinary:    General: Normal vulva.     Urethra: No urethral lesion.     Vagina: No lesions. No bleeding.  Scant, thick, yellow discharge Musculoskeletal:     Cervical back: Neck supple.     Right lower leg: No edema.     Left lower leg: No edema.  Lymphadenopathy:     Upper Body:     Right upper body: No supraclavicular adenopathy.     Left upper body: No supraclavicular adenopathy.     Lower Body: No right inguinal adenopathy. No left inguinal adenopathy.  Skin:    Findings: No rash.  Neurological:     Mental Status: She is oriented to person, place, and time.   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  Lahoma Crocker, MD

## 2022-08-03 ENCOUNTER — Encounter (HOSPITAL_COMMUNITY)
Admission: RE | Admit: 2022-08-03 | Discharge: 2022-08-03 | Disposition: A | Discharge status: Home / Self Care | Payer: Medicare HMO | Source: Ambulatory Visit | Attending: Pulmonary Disease | Admitting: Pulmonary Disease

## 2022-08-03 VITALS — Wt 199.1 lb

## 2022-08-03 DIAGNOSIS — Z5189 Encounter for other specified aftercare: Secondary | ICD-10-CM | POA: Diagnosis not present

## 2022-08-03 DIAGNOSIS — J449 Chronic obstructive pulmonary disease, unspecified: Secondary | ICD-10-CM

## 2022-08-03 NOTE — Progress Notes (Signed)
Daily Session Note  Patient Details  Name: Diane [name] MRN: [id number] Date of Birth: 1940/10/26 Referring Provider:   [name] Pulmonary Rehab Walk Test from 06/23/2022 in [facility], Vascular, & [place]  Referring Provider Meier       Encounter Date: 08/03/2022  Check In:  Session Check In - 08/03/22 [place]       Check-In   Supervising physician immediately available to respond to emergencies [facility] MD immediately available    Physician(s) Gerald Leitz, NP    [facility]    Staff Present Janine Ores, RN, Quentin Ore, MS, ACSM-CEP, Exercise Physiologist;Randi Yevonne Pax, ACSM-CEP, Exercise Physiologist;Samantha [place], RD, LDN;Other;Bailey Pearline Cables, MS, Exercise Physiologist    Virtual Visit No    Medication changes reported     No    Fall or balance concerns reported    Yes    Tobacco Cessation No Change    Warm-up and Cool-down Performed as group-led instruction    Resistance Training Performed Yes    VAD Patient? No    PAD/SET Patient? No      Pain Assessment   Currently in Pain? No/denies    Multiple Pain Sites No             Capillary Blood Glucose: No results found for this or any previous visit (from the past 24 hour(s)).   Exercise Prescription Changes - 08/03/22 1500       Response to Exercise   Blood Pressure (Admit) 126/60    Blood Pressure (Exercise) 140/62    Blood Pressure (Exit) 130/62    Heart Rate (Admit) 64 bpm    Heart Rate (Exercise) 80 bpm    Heart Rate (Exit) 60 bpm    Oxygen Saturation (Admit) 94 %    Oxygen Saturation (Exercise) 93 %    Oxygen Saturation (Exit) 95 %    Rating of Perceived Exertion (Exercise) 13    Perceived Dyspnea (Exercise) 3    Duration Continue with 30 min of aerobic exercise without signs/symptoms of physical distress.    Intensity THRR unchanged      Progression   Progression Continue to progress workloads to maintain intensity  without signs/symptoms of physical distress.      Resistance Training   Training Prescription Yes    Weight red bands    Reps 10-15    Time 10 Minutes      NuStep   Level 3    SPM 50    Minutes 15    METs 2.1      Track   Laps 4    Minutes 15    METs 1.62             Social History   Tobacco Use  Smoking Status Former   Years: 50.00   Types: Cigarettes   Quit date: 05/29/2009   Years since quitting: 13.1  Smokeless Tobacco Never    Goals Met:  Proper associated with RPD/PD & O2 Sat Independence with exercise equipment Exercise tolerated well No report of concerns or symptoms today Strength training completed today  Goals Unmet:  Not Applicable  Comments: Service time is from 1310 to 1450.    Dr. Rodman Pickle is Medical Director for Pulmonary Rehab at [facility].

## 2022-08-08 ENCOUNTER — Encounter (HOSPITAL_COMMUNITY)
Admission: RE | Admit: 2022-08-08 | Discharge: 2022-08-08 | Disposition: A | Discharge status: Home / Self Care | Payer: Medicare HMO | Source: Ambulatory Visit | Attending: Pulmonary Disease | Admitting: Pulmonary Disease

## 2022-08-08 DIAGNOSIS — J449 Chronic obstructive pulmonary disease, unspecified: Secondary | ICD-10-CM | POA: Diagnosis not present

## 2022-08-08 DIAGNOSIS — Z5189 Encounter for other specified aftercare: Secondary | ICD-10-CM | POA: Diagnosis not present

## 2022-08-08 NOTE — Progress Notes (Signed)
Daily Session Note  Patient Details  Name: Diane [name] MRN: [id number] Date of Birth: 1940-06-27 Referring Provider:   [name] Pulmonary Rehab Walk Test from 06/23/2022 in [facility], V[facility], & [place]  Referring Provider Meier       Encounter Date: 08/08/2022  Check In:  Session Check In - 08/08/22 1410       Check-In   Supervising physician immediately available to respond to emergencies [facility] MD immediately available    Physician(s) Eric Form, NP    [facility]    Staff Present Janine Ores, RN, Quentin Ore, MS, ACSM-CEP, Exercise Physiologist;Tecia Cinnamon Yevonne Pax, ACSM-CEP, Exercise Physiologist;Samantha [place], RD, LDN;Other;Bailey Pearline Cables, MS, Exercise Physiologist    Virtual Visit No    Medication changes reported     No    Fall or balance concerns reported    Yes    Tobacco Cessation No Change    Warm-up and Cool-down Performed as group-led instruction    Resistance Training Performed Yes    VAD Patient? No    PAD/SET Patient? No      Pain Assessment   Currently in Pain? No/denies    Multiple Pain Sites No             Capillary Blood Glucose: No results found for this or any previous visit (from the past 24 hour(s)).    Social History   Tobacco Use  Smoking Status Former   Years: 50.00   Types: Cigarettes   Quit date: 05/29/2009   Years since quitting: 13.2  Smokeless Tobacco Never    Goals Met:  Independence with exercise equipment Exercise tolerated well No report of concerns or symptoms today Strength training completed today  Goals Unmet:  Not Applicable  Comments: Service time is from 1314 to 1450.    Dr. Rodman Pickle is Medical Director for Pulmonary Rehab at [facility].

## 2022-08-09 NOTE — Progress Notes (Signed)
Pulmonary Individual Treatment Plan  Patient Details  Name: Diane [name] MRN: [id number] Date of Birth: [birth year] Referring Provider:   [name] Pulmonary Rehab Walk Test from 06/23/2022 in [facility] [place]  Referring Provider Meier       Initial Encounter Date:  Flowsheet Row Pulmonary Rehab Walk Test from 06/23/2022 in [facility] & [place]  Date 06/23/22       Visit Diagnosis: Chronic obstructive pulmonary disease, unspecified COPD type ([place])  Patient's Home Medications on Admission:   Current Outpatient Medications:    acetaminophen (TYLENOL) 500 MG tablet, Take 500 mg by mouth every 6 (six) hours as needed (for pain.)., Disp: , Rfl:    albuterol (VENTOLIN HFA) 108 (90 Base) MCG/ACT inhaler, Inhale 2 puffs into the lungs every 6 (six) hours as needed for wheezing or shortness of breath., Disp: , Rfl:    amiodarone (PACERONE) 200 MG tablet, Take 100 mg by mouth every other day., Disp: , Rfl:    amoxicillin (AMOXIL) 500 MG capsule, Take 2,000 mg by mouth as needed (dental appointment)., Disp: , Rfl:    aspirin EC 81 MG tablet, Take 81 mg by mouth daily., Disp: , Rfl:    busPIRone (BUSPAR) 5 MG tablet, Take 5 mg by mouth 2 (two) times daily., Disp: , Rfl:    co-enzyme Q-10 30 MG capsule, Take 30 mg by mouth daily., Disp: , Rfl:    ELIQUIS 2.5 MG TABS tablet, Take 2.5 mg by mouth 2 (two) times daily., Disp: , Rfl:    ferrous sulfate 325 (65 FE) MG EC tablet, Take 325 mg by mouth once a week. Thursday's, Disp: , Rfl:    Fluticasone-Umeclidin-Vilant (TRELEGY ELLIPTA) 100-62.5-25 MCG/ACT AEPB, Inhale 1 puff into the lungs daily., Disp: 1 each, Rfl: 11   furosemide (LASIX) 40 MG tablet, Take 1 tablet (40 mg total) by mouth daily., Disp: 30 tablet, Rfl: 1   gabapentin (NEURONTIN) 300 MG capsule, Take 300 mg by mouth at bedtime. , Disp: , Rfl:    ketoconazole (NIZORAL) 2  % cream, Apply topically daily., Disp: , Rfl:    Lancets (ONETOUCH DELICA PLUS 123XX123) MISC, USE AS DIRECTED DAILY FOR 30 DAYS, Disp: , Rfl:    loratadine (CLARITIN) 10 MG tablet, Take 10 mg by mouth daily as needed for allergies., Disp: , Rfl:    Multiple Vitamins-Minerals (CENTRUM SILVER 50+WOMEN) TABS, Take 1 tablet by mouth daily., Disp: , Rfl:    nitroGLYCERIN (NITROSTAT) 0.4 MG SL tablet, Place 0.4 mg under the tongue every 5 (five) minutes x 3 doses as needed for chest pain., Disp: , Rfl:    ONETOUCH VERIO test strip, FOR USE WHEN CHECKING BLOOD GLUCOSE ONCE DAILY ALTERNATING AM AND PM BEFORE MEALS FINGER STICK 90 DAYS, Disp: , Rfl: 3   pantoprazole (PROTONIX) 40 MG tablet, Take 1 tablet by mouth once daily, Disp: 90 tablet, Rfl: 0   pravastatin (PRAVACHOL) 10 MG tablet, Take 10 mg by mouth at bedtime., Disp: , Rfl:    spironolactone (ALDACTONE) 25 MG tablet, Take 12.5 mg by mouth every morning., Disp: , Rfl:   Past Medical History: Past Medical History:  Diagnosis Date   Anxiety    Arthritis    back   Atrial fibrillation ([place])    Breast cancer ([place])    right breast cancer - 3 years ago   Chronic diastolic heart failure ([place])    ECHO: 06/20/2017 -  Grade 2 Diastolic Dysfunction    Coronary artery disease    Depression    Diabetes mellitus without complication (HCC)    diet   Dysrhythmia    afib   Fibromyalgia    Herniated disc    Hyperlipemia    Hypertension    Iron deficiency anemia    Longstanding persistent atrial fibrillation (HCC)    Lower back pain    Lumbar stenosis    L4-5   Personal history of chemotherapy    Personal history of radiation therapy    Pulmonary hypertension ([place])    S/P Maze operation for atrial fibrillation 05/09/2018   Complete bilateral atrial lesion set using bipolar radiofrequency and cryothermy ablation with clipping of LA appendage   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   Docetaxel/Cytoxan Q 3 Weeks x 4 cycles   Torn rotator cuff    Tricuspid regurgitation     Tobacco Use: Social History   Tobacco Use  Smoking Status Former   Years: 50.00   Types: Cigarettes   Quit date: 05/29/2009   Years since quitting: 13.2  Smokeless Tobacco Never    Labs: Review Flowsheet  More data exists      Latest Ref Rng & Units 05/15/2018 05/16/2018 05/17/2018 01/06/2019 01/31/2019  Labs for ITP Cardiac and Pulmonary Rehab  Hemoglobin A1c 4.8 - 5.6 % - - - 6.1  6.3   O2 Saturation % 50.7  64.6  62.6  67.9  - -    Capillary Blood Glucose: Lab Results  Component Value Date   GLUCAP 90 02/06/2019   GLUCAP 84 02/06/2019   GLUCAP 81 01/31/2019   GLUCAP 92 05/20/2018   GLUCAP 89 05/20/2018     Pulmonary Assessment Scores:  Pulmonary Assessment Scores     Row Name 06/23/22 1354         ADL UCSD   SOB Score total 49       CAT Score   CAT Score 22       mMRC Score   mMRC Score 3             UCSD: Self-administered rating of dyspnea associated with activities of daily living (ADLs) 6-point scale (0 = "not at all" to 5 = "maximal or unable to do because of breathlessness")  Scoring Scores range from 0 to 120.  Minimally important difference is 5 units  CAT: CAT can identify the health impairment of COPD patients and is better correlated with disease progression.  CAT has a scoring range of zero to 40. The CAT score is classified into four groups of low (less than 10), medium (10 - 20), high (21-30) and very high (31-40) based on the impact level of disease on health status. A CAT score over 10  suggests significant symptoms.  A worsening CAT score could be explained by an exacerbation, poor medication adherence, poor inhaler technique, or progression of COPD or comorbid conditions.  CAT MCID is 2 points  mMRC: mMRC Public affairs consultant)  Dyspnea Scale is used to assess the degree of baseline functional disability in patients of respiratory disease due to dyspnea. No minimal important difference is established. A decrease in score of 1 point or greater is considered a positive change.   Pulmonary Function Assessment:  Pulmonary Function Assessment - 06/23/22 1353       Breath   Bilateral Breath Sounds Decreased    Shortness of Breath Yes;Fear of Shortness of Breath;Limiting activity;Panic with Shortness of Breath             Exercise Target Goals: Exercise Program Goal: Individual exercise prescription set using results from initial 6 min walk test and THRR while considering  patient's activity barriers and safety.   Exercise Prescription Goal: Initial exercise prescription builds to 30-45 minutes a day of aerobic activity, 2-3 days per week.  Home exercise guidelines will be given to patient during program as part of exercise prescription that the participant will acknowledge.  Activity Barriers & Risk Stratification:  Activity Barriers & Cardiac Risk Stratification - 06/23/22 1351       Activity Barriers & Cardiac Risk Stratification   Activity Barriers Shortness of Breath;Muscular Weakness;Back Problems;Neck/Spine Problems;Arthritis;Deconditioning;History of Falls             6 Minute Walk:  6 Minute Walk     Row Name 06/23/22 1445         6 Minute Walk   Phase Initial     Distance 600 feet     Walk Time 9 minutes     # of Rest Breaks 2  1:45-2:32, 3:15-3:44     MPH 1.14     METS 0.95     RPE 11     Perceived Dyspnea  2     VO2 Peak 3.33     Symptoms Yes (comment)     Comments Rt hip pain chronic 5/10     Resting HR 59 bpm      Resting BP 120/50     Resting Oxygen Saturation  98 %     Exercise Oxygen Saturation  during 6 min walk 95 %     Max Ex. HR 82 bpm     Max Ex. BP 150/50     2 Minute Post BP 142/58       Interval HR   1 Minute HR 72     2 Minute HR 72     3 Minute HR 71     4 Minute HR 73     5 Minute HR 81     6 Minute HR 82     2 Minute Post HR 62     Interval Heart Rate? Yes       Interval Oxygen   Interval Oxygen? Yes     Baseline Oxygen Saturation % 98 %     1 Minute Oxygen Saturation % 95 %     1 Minute Liters of Oxygen 0 L     2 Minute Oxygen Saturation % 95 %     2 Minute Liters of Oxygen 0 L     3 Minute Oxygen Saturation % 96 %     3 Minute Liters of Oxygen 0 L     4 Minute Oxygen Saturation % 97 %     4 Minute Liters of Oxygen 0 L     5 Minute Oxygen Saturation % 98 %     5 Minute Liters of Oxygen 0 L     6 Minute Oxygen Saturation %  97 %     6 Minute Liters of Oxygen 0 L     2 Minute Post Oxygen Saturation % 98 %     2 Minute Post Liters of Oxygen 0 L              Oxygen Initial Assessment:  Oxygen Initial Assessment - 06/23/22 1352       Home Oxygen   Home Oxygen Device None    Sleep Oxygen Prescription None    Home Exercise Oxygen Prescription None    Home Resting Oxygen Prescription None      Initial 6 min Walk   Oxygen Used None      Program Oxygen Prescription   Program Oxygen Prescription None      Intervention   Short Term Goals To learn and understand importance of maintaining oxygen saturations>88%;To learn and demonstrate proper pursed lip breathing techniques or other breathing techniques. ;To learn and demonstrate proper use of respiratory medications    Long  Term Goals Maintenance of O2 saturations>88%;Compliance with respiratory medication;Verbalizes importance of monitoring SPO2 with pulse oximeter and return demonstration;Exhibits proper breathing techniques, such as pursed lip breathing or other method taught during program session;Demonstrates  proper use of MDI's             Oxygen Re-Evaluation:  Oxygen Re-Evaluation     Row Name 07/11/22 1656 08/03/22 0944           Program Oxygen Prescription   Program Oxygen Prescription None None        Home Oxygen   Home Oxygen Device None None      Sleep Oxygen Prescription None None      Home Exercise Oxygen Prescription None None      Home Resting Oxygen Prescription None None        Goals/Expected Outcomes   Short Term Goals To learn and understand importance of maintaining oxygen saturations>88%;To learn and demonstrate proper pursed lip breathing techniques or other breathing techniques. ;To learn and demonstrate proper use of respiratory medications To learn and understand importance of maintaining oxygen saturations>88%;To learn and demonstrate proper pursed lip breathing techniques or other breathing techniques. ;To learn and demonstrate proper use of respiratory medications      Long  Term Goals Maintenance of O2 saturations>88%;Compliance with respiratory medication;Verbalizes importance of monitoring SPO2 with pulse oximeter and return demonstration;Exhibits proper breathing techniques, such as pursed lip breathing or other method taught during program session;Demonstrates proper use of MDI's Maintenance of O2 saturations>88%;Compliance with respiratory medication;Verbalizes importance of monitoring SPO2 with pulse oximeter and return demonstration;Exhibits proper breathing techniques, such as pursed lip breathing or other method taught during program session;Demonstrates proper use of MDI's      Goals/Expected Outcomes Compliance and understanding of oxygen saturation monitoring and breathing techniques to decrease shortness of breath Compliance and understanding of oxygen saturation monitoring and breathing techniques to decrease shortness of breath               Oxygen Discharge (Final Oxygen Re-Evaluation):  Oxygen Re-Evaluation - 08/03/22 0944       Program  Oxygen Prescription   Program Oxygen Prescription None      Home Oxygen   Home Oxygen Device None    Sleep Oxygen Prescription None    Home Exercise Oxygen Prescription None    Home Resting Oxygen Prescription None      Goals/Expected Outcomes   Short Term Goals To learn and understand importance of maintaining oxygen saturations>88%;To learn and demonstrate proper pursed  lip breathing techniques or other breathing techniques. ;To learn and demonstrate proper use of respiratory medications    Long  Term Goals Maintenance of O2 saturations>88%;Compliance with respiratory medication;Verbalizes importance of monitoring SPO2 with pulse oximeter and return demonstration;Exhibits proper breathing techniques, such as pursed lip breathing or other method taught during program session;Demonstrates proper use of MDI's    Goals/Expected Outcomes Compliance and understanding of oxygen saturation monitoring and breathing techniques to decrease shortness of breath             Initial Exercise Prescription:  Initial Exercise Prescription - 06/23/22 1400       Date of Initial Exercise RX and Referring Provider   Date 06/23/22    Referring Provider Meier    Expected Discharge Date 08/31/22      NuStep   Level 1    SPM 50    Minutes 30      Prescription Details   Frequency (times per week) 2    Duration Progress to 30 minutes of continuous aerobic without signs/symptoms of physical distress      Intensity   THRR 40-80% of Max Heartrate 56-113    Ratings of Perceived Exertion 11-13    Perceived Dyspnea 0-4      Progression   Progression Continue progressive overload as per policy without signs/symptoms or physical distress.      Resistance Training   Training Prescription Yes    Weight red bands    Reps 10-15             Perform Capillary Blood Glucose checks as needed.  Exercise Prescription Changes:   Exercise Prescription Changes     Row Name 07/11/22 1600 07/20/22 1500  07/25/22 1500 08/03/22 1500       Response to Exercise   Blood Pressure (Admit) 128/56 -- 124/76 126/60    Blood Pressure (Exercise) 140/58 -- 118/48 140/62    Blood Pressure (Exit) 122/60 -- 118/66 130/62    Heart Rate (Admit) 66 bpm -- 64 bpm 64 bpm    Heart Rate (Exercise) 77 bpm -- 76 bpm 80 bpm    Heart Rate (Exit) 65 bpm -- 58 bpm 60 bpm    Oxygen Saturation (Admit) 96 % -- 96 % 94 %    Oxygen Saturation (Exercise) 94 % -- 95 % 93 %    Oxygen Saturation (Exit) 94 % -- 98 % 95 %    Rating of Perceived Exertion (Exercise) 9 -- 11 13    Perceived Dyspnea (Exercise) 1 -- 1 3    Duration Continue with 30 min of aerobic exercise without signs/symptoms of physical distress. -- Continue with 30 min of aerobic exercise without signs/symptoms of physical distress. Continue with 30 min of aerobic exercise without signs/symptoms of physical distress.    Intensity THRR unchanged -- THRR unchanged THRR unchanged      Progression   Progression Continue to progress workloads to maintain intensity without signs/symptoms of physical distress. -- Continue to progress workloads to maintain intensity without signs/symptoms of physical distress. Continue to progress workloads to maintain intensity without signs/symptoms of physical distress.      Resistance Training   Training Prescription Yes -- Yes Yes    Weight red bands -- red bands red bands    Reps 10-15 -- 10-15 10-15    Time 10 Minutes -- 10 Minutes 10 Minutes      Interval Training   Interval Training No -- -- --      NuStep  Level 1 -- 2 3    SPM 50 -- 50 50    Minutes 30 -- 15 15    METs 2 -- 2 2.1      Track   Laps -- -- 3 4    Minutes -- -- 15 15    METs -- -- 1.46 1.62      Home Exercise Plan   Plans to continue exercise at -- [employer] (comment)  gym -- --    Frequency -- Add 1 additional day to program exercise sessions. -- --    Initial Home Exercises Provided -- 07/20/22 -- --             Exercise  Comments:   Exercise Comments     Row Name 06/29/22 1549 07/20/22 1515         Exercise Comments Pt completed first day of exercise. Pt exercised on recumbent stepper for 30 min, level 1, METs 1.7. Performed warmup and cooldown with verbal cues. c/o left knee pain doing squats, will try seated leg raises next session. Discussed METs with fair reception. Discussed home exercise with pt. She is not currently doing aerobic exercise. Discussed with pt going to gym that she is a member of and exercising on similar equipment as she does in [place]. Pt agreeable. Encouraged 30 min, adding atleast 1 day a week.               Exercise Goals and Review:   Exercise Goals     Row Name 06/23/22 1352 07/11/22 1656 08/03/22 0942         Exercise Goals   Increase Physical Activity Yes Yes Yes     Intervention Provide advice, education, support and counseling about physical activity/exercise needs.;Develop an individualized exercise prescription for aerobic and resistive training based on initial evaluation findings, risk stratification, comorbidities and participant's personal goals. Provide advice, education, support and counseling about physical activity/exercise needs.;Develop an individualized exercise prescription for aerobic and resistive training based on initial evaluation findings, risk stratification, comorbidities and participant's personal goals. Provide advice, education, support and counseling about physical activity/exercise needs.;Develop an individualized exercise prescription for aerobic and resistive training based on initial evaluation findings, risk stratification, comorbidities and participant's personal goals.     Expected Outcomes Short Term: Attend rehab on a regular basis to increase amount of physical activity.;Long Term: Exercising regularly at least 3-5 days a week.;Long Term: Add in home exercise to make exercise part of routine and to increase amount of physical activity. Short  Term: Attend rehab on a regular basis to increase amount of physical activity.;Long Term: Exercising regularly at least 3-5 days a week.;Long Term: Add in home exercise to make exercise part of routine and to increase amount of physical activity. Short Term: Attend rehab on a regular basis to increase amount of physical activity.;Long Term: Exercising regularly at least 3-5 days a week.;Long Term: Add in home exercise to make exercise part of routine and to increase amount of physical activity.     Increase Strength and Stamina Yes Yes Yes     Intervention Provide advice, education, support and counseling about physical activity/exercise needs.;Develop an individualized exercise prescription for aerobic and resistive training based on initial evaluation findings, risk stratification, comorbidities and participant's personal goals. Provide advice, education, support and counseling about physical activity/exercise needs.;Develop an individualized exercise prescription for aerobic and resistive training based on initial evaluation findings, risk stratification, comorbidities and participant's personal goals. Provide advice, education, support and counseling about physical  activity/exercise needs.;Develop an individualized exercise prescription for aerobic and resistive training based on initial evaluation findings, risk stratification, comorbidities and participant's personal goals.     Expected Outcomes Short Term: Increase workloads from initial exercise prescription for resistance, speed, and METs.;Short Term: Perform resistance training exercises routinely during rehab and add in resistance training at home;Long Term: Improve cardiorespiratory fitness, muscular endurance and strength as measured by increased METs and functional capacity (6MWT) Short Term: Increase workloads from initial exercise prescription for resistance, speed, and METs.;Short Term: Perform resistance training exercises routinely during rehab  and add in resistance training at home;Long Term: Improve cardiorespiratory fitness, muscular endurance and strength as measured by increased METs and functional capacity (6MWT) Short Term: Increase workloads from initial exercise prescription for resistance, speed, and METs.;Short Term: Perform resistance training exercises routinely during rehab and add in resistance training at home;Long Term: Improve cardiorespiratory fitness, muscular endurance and strength as measured by increased METs and functional capacity (6MWT)     Able to understand and use rate of perceived exertion (RPE) scale Yes Yes Yes     Intervention Provide education and explanation on how to use RPE scale Provide education and explanation on how to use RPE scale Provide education and explanation on how to use RPE scale     Expected Outcomes Short Term: Able to use RPE daily in rehab to express subjective intensity level;Long Term:  Able to use RPE to guide intensity level when exercising independently Short Term: Able to use RPE daily in rehab to express subjective intensity level;Long Term:  Able to use RPE to guide intensity level when exercising independently Short Term: Able to use RPE daily in rehab to express subjective intensity level;Long Term:  Able to use RPE to guide intensity level when exercising independently     Able to understand and use Dyspnea scale Yes Yes Yes     Intervention Provide education and explanation on how to use Dyspnea scale Provide education and explanation on how to use Dyspnea scale Provide education and explanation on how to use Dyspnea scale     Expected Outcomes Short Term: Able to use Dyspnea scale daily in rehab to express subjective sense of shortness of breath during exertion;Long Term: Able to use Dyspnea scale to guide intensity level when exercising independently Short Term: Able to use Dyspnea scale daily in rehab to express subjective sense of shortness of breath during exertion;Long Term:  Able to use Dyspnea scale to guide intensity level when exercising independently Short Term: Able to use Dyspnea scale daily in rehab to express subjective sense of shortness of breath during exertion;Long Term: Able to use Dyspnea scale to guide intensity level when exercising independently     Knowledge and understanding of Target Heart Rate Range (THRR) Yes Yes Yes     Intervention Provide education and explanation of THRR including how the numbers were predicted and where they are located for reference Provide education and explanation of THRR including how the numbers were predicted and where they are located for reference Provide education and explanation of THRR including how the numbers were predicted and where they are located for reference     Expected Outcomes Short Term: Able to state/look up THRR;Short Term: Able to use daily as guideline for intensity in rehab;Long Term: Able to use THRR to govern intensity when exercising independently Short Term: Able to state/look up THRR;Short Term: Able to use daily as guideline for intensity in rehab;Long Term: Able to use THRR to govern intensity when  exercising independently Short Term: Able to state/look up THRR;Short Term: Able to use daily as guideline for intensity in rehab;Long Term: Able to use THRR to govern intensity when exercising independently     Understanding of Exercise Prescription Yes Yes Yes     Intervention Provide education, explanation, and written materials on patient's individual exercise prescription Provide education, explanation, and written materials on patient's individual exercise prescription Provide education, explanation, and written materials on patient's individual exercise prescription     Expected Outcomes Short Term: Able to explain program exercise prescription;Long Term: Able to explain home exercise prescription to exercise independently Short Term: Able to explain program exercise prescription;Long Term: Able to  explain home exercise prescription to exercise independently Short Term: Able to explain program exercise prescription;Long Term: Able to explain home exercise prescription to exercise independently              Exercise Goals Re-Evaluation :  Exercise Goals Re-Evaluation     Row Name 07/11/22 1656 08/03/22 0942           Exercise Goal Re-Evaluation   Exercise Goals Review Increase Physical Activity;Able to understand and use Dyspnea scale;Understanding of Exercise Prescription;Increase Strength and Stamina;Knowledge and understanding of Target Heart Rate Range (THRR);Able to understand and use rate of perceived exertion (RPE) scale Increase Physical Activity;Able to understand and use Dyspnea scale;Understanding of Exercise Prescription;Increase Strength and Stamina;Knowledge and understanding of Target Heart Rate Range (THRR);Able to understand and use rate of perceived exertion (RPE) scale      Comments Pt has completed 3 days of exercise. Pt exercised on recumbent stepper for 30 min, level 1, METs 2.0.  Pt is now working on squats. She is beginning to slowly progress her exercise tolerance. will continue to monitor. Pt has completed 9 days of exercise. Pt is exercising on recumbent stepper for 15 min, level 2, METs 2.0.  She is then walking the track with the rollator for 15 min, METs 1.92. She is working on squats. She is beginning to slowly progress her exercise tolerance. will continue to monitor.      Expected Outcomes Through exercise at rehab and home, the patient will decrease shortness of breath with daily activities and feel confident in carrying out an exercise regimen at home. Through exercise at rehab and home, the patient will decrease shortness of breath with daily activities and feel confident in carrying out an exercise regimen at home.               Discharge Exercise Prescription (Final Exercise Prescription Changes):  Exercise Prescription Changes - 08/03/22 1500        Response to Exercise   Blood Pressure (Admit) 126/60    Blood Pressure (Exercise) 140/62    Blood Pressure (Exit) 130/62    Heart Rate (Admit) 64 bpm    Heart Rate (Exercise) 80 bpm    Heart Rate (Exit) 60 bpm    Oxygen Saturation (Admit) 94 %    Oxygen Saturation (Exercise) 93 %    Oxygen Saturation (Exit) 95 %    Rating of Perceived Exertion (Exercise) 13    Perceived Dyspnea (Exercise) 3    Duration Continue with 30 min of aerobic exercise without signs/symptoms of physical distress.    Intensity THRR unchanged      Progression   Progression Continue to progress workloads to maintain intensity without signs/symptoms of physical distress.      Resistance Training   Training Prescription Yes    Weight red bands  Reps 10-15    Time 10 Minutes      NuStep   Level 3    SPM 50    Minutes 15    METs 2.1      Track   Laps 4    Minutes 15    METs 1.62             Nutrition:  Target Goals: Understanding of nutrition guidelines, daily intake of sodium '1500mg'$ , cholesterol '200mg'$ , calories 30% from fat and 7% or less from saturated fats, daily to have 5 or more servings of fruits and vegetables.  Biometrics:    Nutrition Therapy Plan and Nutrition Goals:  Nutrition Therapy & Goals - 07/27/22 1559       Nutrition Therapy   Diet Heart Healthy/Renal diet    Drug/Food Interactions Statins/Certain Fruits      Personal Nutrition Goals   Nutrition Goal Patient to improve diet quality by using the plate method as a daily guide for meal planning to include lean protein/plant protein, fruits, vegetables, whole grains, nonfat dairy as part of a balanced diet    Personal Goal #2 Patient to reduce sodium to '1500mg'$  per day    Comments Diane reports normal appetite and maintains a typical eating routine/schedule. She reports eating a wide variety of fruits, vegetables, whole grains, and protein sources. 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 Her oral iron was increased at that appointment.  Patient will continue to benefit from participation in pulmonary rehab for nutrition, exercise, and lifestyle modification.      Intervention Plan   Intervention Prescribe, educate and counsel regarding individualized specific dietary modifications aiming towards targeted core components such as weight, hypertension, lipid management, diabetes, heart failure and other comorbidities.;Nutrition handout(s) given to patient.    Expected Outcomes Short Term Goal: Understand basic principles of dietary content, such as calories, fat, sodium, cholesterol and nutrients.;Long Term Goal: Adherence to prescribed nutrition plan.             Nutrition Assessments:  Nutrition Assessments - 06/30/22 0903       Rate Your Plate Scores   Pre Score 40            MEDIFICTS Score Key: ?70 Need to make dietary changes  40-70 Heart Healthy Diet ? 40 Therapeutic Level Cholesterol Diet  Flowsheet Row PULMONARY REHAB OTHER RESPIRATORY from 06/29/2022 in [facility], [facility], & [facility]  Picture Your Plate Total Score on Admission 40      Picture Your Plate Scores: [id number] Unhealthy dietary pattern with much room for improvement. 41-50 Dietary pattern unlikely to meet recommendations for good health and room for improvement. 51-60 More healthful dietary pattern, with some room for improvement.  >60 Healthy dietary pattern, although there may be some specific behaviors that could be improved.    Nutrition Goals Re-Evaluation:  Nutrition Goals Re-Evaluation     [place] Name 06/29/22 1518 07/27/22 1559           Goals   Current Weight 198 lb 3.1 oz (89.9 kg) 197 lb 8.5 oz (89.6 kg)      Comment GFR 20.7, Cr 2.21, A1c (from 2020) 6.3 labs not available for review from outside nephrologist.      Expected Outcome Diane reports normal appetite and  maintains a typical eating routine/schedule. She reports eating a wide variety of fruits, vegetables, whole grains, and  protein sources. 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 She reports upcoming follow-up appointment with nephrology on 2/22; she reports he has not recommended any dietary changes at previous appointments. Patient will continue to benefit from participation in pulmonary rehab for nutrition, exercise, and lifestyle modification. Diane reports normal appetite and maintains a typical eating routine/schedule. She reports eating a wide variety of fruits, vegetables, whole grains, and protein sources. 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 Her oral iron was increased at that appointment. Patient will continue to benefit from participation in pulmonary rehab for nutrition, exercise, and lifestyle modification.               Nutrition Goals Discharge (Final Nutrition Goals Re-Evaluation):  Nutrition Goals Re-Evaluation - 07/27/22 1559       Goals   Current Weight 197 lb 8.5 oz (89.6 kg)    Comment labs not available for review from outside nephrologist.    Expected Outcome Diane reports normal appetite and maintains a typical eating routine/schedule. She reports eating a wide variety of fruits, vegetables, whole grains, and protein sources. 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 Her oral iron was increased at that appointment. Patient will continue to benefit from participation in pulmonary rehab for nutrition, exercise, and lifestyle modification.             Psychosocial: Target Goals: Acknowledge presence or absence of significant depression and/or stress,  maximize coping skills, provide positive support system. Participant is able to verbalize types and ability to use techniques and skills needed for reducing stress and depression.  Initial Review & Psychosocial Screening:  Initial Psych Review & Screening - 06/23/22 1339       Initial Review   Current issues with Current Psychotropic Meds;Current Sleep Concerns    Source of Stress Concerns None Identified    Comments Daughter passes away about a year ago, has a hard time staying a sleep thinks about kids      [place]? Yes    Concerns No support system    Comments great grandson, worries about kids, will she ever see or talk to them again      Barriers   Psychosocial barriers to participate in program The patient should benefit from training in stress management and relaxation.;Psychosocial barriers identified (see note)      Screening Interventions   Interventions Encouraged to exercise    Expected Outcomes Long Term goal: The participant improves quality of Life and PHQ9 Scores as seen by post scores and/or verbalization of changes;Short Term goal: Identification and review with participant of any Quality of Life or Depression concerns found by scoring the questionnaire.;Long Term Goal: Stressors or current issues are controlled or eliminated.             Quality of Life Scores:  Scores of 19 and below usually indicate a poorer quality of life in these areas.  A difference of  2-3 points is a clinically meaningful difference.  A difference of 2-3 points in the total score of the Quality of Life Index has been associated with significant improvement in overall quality of life, self-image, physical symptoms, and general health in studies  assessing change in quality of life.  PHQ-9: Review Flowsheet       06/23/2022 06/03/2018 03/15/2018 11/11/2013  Depression screen PHQ 2/9  Decreased Interest 1 0 0 0  Down, Depressed, Hopeless 0 0 0 1  PHQ - 2 Score  1 0 0 1  Altered sleeping 0 - 3 -  Tired, decreased energy 1 - 3 -  Change in appetite 0 - 2 -  Feeling bad or failure about yourself  0 - 0 -  Trouble concentrating 0 - 0 -  Moving slowly or fidgety/restless 0 - 0 -  Suicidal thoughts 0 - 0 -  PHQ-9 Score 2 - 8 -  Difficult doing work/chores Not difficult at all - Not difficult at all -   Interpretation of Total Score  Total Score Depression Severity:  1-4 = Minimal depression, 5-9 = Mild depression, 10-14 = Moderate depression, 15-19 = Moderately severe depression, 20-27 = Severe depression   Psychosocial Evaluation and Intervention:  Psychosocial Evaluation - 06/23/22 1348       Psychosocial Evaluation & Interventions   Interventions Encouraged to exercise with the program and follow exercise prescription    Comments Pt declines therapist referral    Continue Psychosocial Services  Follow up required by staff             Psychosocial Re-Evaluation:  Psychosocial Re-Evaluation     [place] Name 07/07/22 1643 08/04/22 1545           Psychosocial Re-Evaluation   Current issues with Current Stress Concerns;Current Sleep Concerns Current Stress Concerns;Current Sleep Concerns      Comments Diane finds herself lying in bed thinking about her daughter's passing, her children and grandchildren. She is currently under stress from her family dynamic situation. She is compliant with taking her psychotropic meds and declines a referral at this time for a therapist. We will continue to monitor and assess Diane for any needs. Diane finds herself lying in bed thinking about her daughter's passing, her children and grandchildren. She is currently under stress from her family dynamic situation. She is compliant with taking her psychotropic meds and declines a referral at this time for a therapist. We will continue to monitor and assess Diane for any needs.      Expected Outcomes For Diane to attend PR while decreasing her stress  concerns, to display positive and healthy coping skills, and to obtain better sleep For Diane to attend PR while decreasing her stress concerns, to display positive and healthy coping skills, and to obtain better sleep      Interventions Stress management education;Encouraged to attend Pulmonary Rehabilitation for the exercise;Relaxation education Stress management education;Encouraged to attend Pulmonary Rehabilitation for the exercise;Relaxation education      Continue Psychosocial Services  Follow up required by staff Follow up required by staff               Psychosocial Discharge (Final Psychosocial Re-Evaluation):  Psychosocial Re-Evaluation - 08/04/22 1545       Psychosocial Re-Evaluation   Current issues with Current Stress Concerns;Current Sleep Concerns    Comments Diane finds herself lying in bed thinking about her daughter's passing, her children and grandchildren. She is currently under stress from her family dynamic situation. She is compliant with taking her psychotropic meds and declines a referral at this time for a therapist. We will continue to monitor and assess Diane for any needs.    Expected Outcomes For Diane to attend PR while decreasing her stress concerns,  to display positive and healthy coping skills, and to obtain better sleep    Interventions Stress management education;Encouraged to attend Pulmonary Rehabilitation for the exercise;Relaxation education    Continue Psychosocial Services  Follow up required by staff             Education: Education Goals: Education classes will be provided on a weekly basis, covering required topics. Participant will state understanding/return demonstration of topics presented.  Learning Barriers/Preferences:  Learning Barriers/Preferences - 06/23/22 1349       Learning Barriers/Preferences   Learning Barriers None    Learning Preferences Group Instruction;Individual Instruction;Verbal Instruction;Written  Material;Skilled Demonstration;Video;Audio             Education Topics: Introduction to Pulmonary Rehab Group instruction provided by PowerPoint, verbal discussion, and written material to support subject matter. Instructor reviews what Pulmonary Rehab is, the purpose of the program, and how patients are referred.     Know Your Numbers Group instruction that is supported by a PowerPoint presentation. Instructor discusses importance of knowing and understanding resting, exercise, and post-exercise oxygen saturation, heart rate, and blood pressure. Oxygen saturation, heart rate, blood pressure, rating of perceived exertion, and dyspnea are reviewed along with a normal range for these values.    Exercise for the Pulmonary Patient Group instruction that is supported by a PowerPoint presentation. Instructor discusses benefits of exercise, core components of exercise, frequency, duration, and intensity of an exercise routine, importance of utilizing pulse oximetry during exercise, safety while exercising, and options of places to exercise outside of rehab.    MET Level  Group instruction provided by PowerPoint, verbal discussion, and written material to support subject matter. Instructor reviews what METs are and how to increase METs.  Flowsheet Row PULMONARY REHAB OTHER RESPIRATORY from 06/29/2022 in [facility] for Heart, Vascular, & [place]  Date 06/29/22  Educator EP  Instruction Review Code 1- Verbalizes Understanding       Pulmonary Medications Verbally interactive group education provided by instructor with focus on inhaled medications and proper administration.   Anatomy and Physiology of the Respiratory System Group instruction provided by PowerPoint, verbal discussion, and written material to support subject matter. Instructor reviews respiratory cycle and anatomical components of the respiratory system and their functions. Instructor also reviews  differences in obstructive and restrictive respiratory diseases with examples of each.  Flowsheet Row PULMONARY REHAB OTHER RESPIRATORY from 08/03/2022 in [facility] for Heart, Vascular, & Lung Health  Date 08/03/22  Educator Ep  Instruction Review Code 1- Verbalizes Understanding       Oxygen Safety Group instruction provided by PowerPoint, verbal discussion, and written material to support subject matter. There is an overview of "What is Oxygen" and "Why do we need it".  Instructor also reviews how to create a safe environment for oxygen use, the importance of using oxygen as prescribed, and the risks of noncompliance. There is a brief discussion on traveling with oxygen and resources the patient may utilize.   Oxygen Use Group instruction provided by PowerPoint, verbal discussion, and written material to discuss how supplemental oxygen is prescribed and different types of oxygen supply systems. Resources for more information are provided.  Flowsheet Row PULMONARY REHAB OTHER RESPIRATORY from 06/29/2022 in [facility] for Heart, Vascular, & Lung Health  Date 06/29/22  Educator RT  Instruction Review Code 1- Verbalizes Understanding       Breathing Techniques Group instruction that is supported by demonstration and informational handouts.  Instructor discusses the benefits of pursed lip and diaphragmatic breathing and detailed demonstration on how to perform both.     Risk Factor Reduction Group instruction that is supported by a PowerPoint presentation. Instructor discusses the definition of a risk factor, different risk factors for pulmonary disease, and how the heart and lungs work together.   MD Day A group question and answer session with a medical doctor that allows participants to ask questions that relate to their pulmonary disease state.   Nutrition for the Pulmonary Patient Group instruction provided by PowerPoint slides,  verbal discussion, and written materials to support subject matter. The instructor gives an explanation and review of healthy diet recommendations, which includes a discussion on weight management, recommendations for fruit and vegetable consumption, as well as protein, fluid, caffeine, fiber, sodium, sugar, and alcohol. Tips for eating when patients are short of breath are discussed.    Other Education Group or individual verbal, written, or video instructions that support the educational goals of the pulmonary rehab program. Flowsheet Row PULMONARY REHAB OTHER RESPIRATORY from 07/25/2022 in [facility] for Heart, Vascular, & Lung Health  Date 07/25/22  Educator EP  Instruction Review Code 1- Verbalizes Understanding        Knowledge Questionnaire Score:  Knowledge Questionnaire Score - 06/23/22 1410       Knowledge Questionnaire Score   Pre Score 13/18             Core Components/Risk Factors/Patient Goals at Admission:  Personal Goals and Risk Factors at Admission - 06/23/22 1349       Core Components/Risk Factors/Patient Goals on Admission    Weight Management Weight Maintenance    Intervention Weight Management: Provide education and appropriate resources to help participant work on and attain dietary goals.;Weight Management/Obesity: Establish reasonable short term and long term weight goals.    Admit Weight 199 lb (90.3 kg)    Expected Outcomes Weight Maintenance: Understanding of the daily nutrition guidelines, which includes 25-35% calories from fat, 7% or less cal from saturated fats, less than '200mg'$  cholesterol, less than 1.5gm of sodium, & 5 or more servings of fruits and vegetables daily    Improve shortness of breath with ADL's Yes    Intervention Provide education, individualized exercise plan and daily activity instruction to help decrease symptoms of SOB with activities of daily living.    Expected Outcomes Short Term: Improve  cardiorespiratory fitness to achieve a reduction of symptoms when performing ADLs    Increase knowledge of respiratory medications and ability to use respiratory devices properly  Yes    Intervention Provide education and demonstration as needed of appropriate use of medications, inhalers, and oxygen therapy.    Expected Outcomes Short Term: Achieves understanding of medications use. Understands that oxygen is a medication prescribed by physician. Demonstrates appropriate use of inhaler and oxygen therapy.;Long Term: Maintain appropriate use of medications, inhalers, and oxygen therapy.    Stress Yes    Intervention Offer individual and/or small group education and counseling on adjustment to heart disease, stress management and health-related lifestyle change. Teach and support self-help strategies.;Refer participants experiencing significant psychosocial distress to appropriate mental health specialists for further evaluation and treatment. When possible, include family members and significant others in education/counseling sessions.    Expected Outcomes Short Term: Participant demonstrates changes in health-related behavior, relaxation and other stress management skills, ability to obtain effective social support, and compliance with psychotropic medications if prescribed.;Long Term: Emotional wellbeing is indicated by absence of clinically  significant psychosocial distress or social isolation.             Core Components/Risk Factors/Patient Goals Review:   Goals and Risk Factor Review     Row Name 07/07/22 1648 08/04/22 1547           Core Components/Risk Factors/Patient Goals Review   Personal Goals Review Weight Management/Obesity;Improve shortness of breath with ADL's;Develop more efficient breathing techniques such as purse lipped breathing and diaphragmatic breathing and practicing self-pacing with activity.;Increase knowledge of respiratory medications and ability to use respiratory  devices properly.;Stress;Lipids;Diabetes Weight Management/Obesity;Improve shortness of breath with ADL's;Develop more efficient breathing techniques such as purse lipped breathing and diaphragmatic breathing and practicing self-pacing with activity.;Increase knowledge of respiratory medications and ability to use respiratory devices properly.;Stress;Lipids;Diabetes      Review Diane has attended 2 PR sessions so far. She is exercising on the NuStep for 30 minutes. It is too soon to evaluate Diane for any progress. We will continue to support Diane to reach her goals. Diane has attended 10 PR sessions so far. She is currently exeecising on the Nustep and walking the track. She has been able to increase her workload and METs on the Nustep. Somedays she does better than others when walking the track. Diane has attended classes on oxygen use, stress and energy conservation, warning signs and symptoms, sedentary lifestyle, and anatomy and physiology. Diane demonstrates pursed lip breathing when she gets SOB. Diane is working with our staff dietician to manage her weight. Her diabetes and cholesterol are currently under control. We will continue to monitor Sharalee's progress.      Expected Outcomes See admission goals See admission goals               Core Components/Risk Factors/Patient Goals at Discharge (Final Review):   Goals and Risk Factor Review - 08/04/22 1547       Core Components/Risk Factors/Patient Goals Review   Personal Goals Review Weight Management/Obesity;Improve shortness of breath with ADL's;Develop more efficient breathing techniques such as purse lipped breathing and diaphragmatic breathing and practicing self-pacing with activity.;Increase knowledge of respiratory medications and ability to use respiratory devices properly.;Stress;Lipids;Diabetes    Review Diane has attended 10 PR sessions so far. She is currently exeecising on the Nustep and walking the track. She  has been able to increase her workload and METs on the Nustep. Somedays she does better than others when walking the track. Diane has attended classes on oxygen use, stress and energy conservation, warning signs and symptoms, sedentary lifestyle, and anatomy and physiology. Diane demonstrates pursed lip breathing when she gets SOB. Diane is working with our staff dietician to manage her weight. Her diabetes and cholesterol are currently under control. We will continue to monitor Taysia's progress.    Expected Outcomes See admission goals             ITP Comments:   Comments: Dr. Rodman Pickle is Medical Director for Pulmonary Rehab at [facility].

## 2022-08-10 ENCOUNTER — Encounter (HOSPITAL_COMMUNITY)
Admission: RE | Admit: 2022-08-10 | Discharge: 2022-08-10 | Disposition: A | Discharge status: Home / Self Care | Payer: Medicare HMO | Source: Ambulatory Visit | Attending: Pulmonary Disease | Admitting: Pulmonary Disease

## 2022-08-10 DIAGNOSIS — J449 Chronic obstructive pulmonary disease, unspecified: Secondary | ICD-10-CM | POA: Diagnosis not present

## 2022-08-10 DIAGNOSIS — Z5189 Encounter for other specified aftercare: Secondary | ICD-10-CM | POA: Diagnosis not present

## 2022-08-10 NOTE — Progress Notes (Signed)
Daily Session Note  Patient Details  Name: Diane [name] MRN: [id number] Date of Birth: December 09, 1940 Referring Provider:   April [name] Pulmonary Rehab Walk Test from 06/23/2022 in [facility]  Referring Provider Meier       Encounter Date: 08/10/2022  Check In:  Session Check In - 08/10/22 1419       Check-In   Supervising physician immediately available to respond to emergencies [facility] MD immediately available    Physician(s) Dr. Veneda Melter    Location [facility]    Staff Present Janine Ores, RN, Quentin Ore, MS, ACSM-CEP, Exercise Physiologist;Randi Yevonne Pax, ACSM-CEP, Exercise Physiologist;Samantha [place], RD, LDN;Other    Virtual Visit No    Medication changes reported     No    Fall or balance concerns reported    Yes    Tobacco Cessation No Change    Warm-up and Cool-down Performed as group-led instruction    Resistance Training Performed Yes    VAD Patient? No    PAD/SET Patient? No      Pain Assessment   Currently in Pain? No/denies    Pain Score 0-No pain    Multiple Pain Sites No             Capillary Blood Glucose: No results found for this or any previous visit (from the past 24 hour(s)).    Social History   Tobacco Use  Smoking Status Former   Years: 74   Types: Cigarettes   Quit date: 05/29/2009   Years since quitting: 13.2  Smokeless Tobacco Never    Goals Met:  Independence with exercise equipment Exercise tolerated well No report of concerns or symptoms today Strength training completed today  Goals Unmet:  Not Applicable  Comments: Service time is from 1310 to 1433    Dr. Rodman Pickle is Medical Director for Pulmonary Rehab at [facility].

## 2022-08-15 ENCOUNTER — Encounter (HOSPITAL_COMMUNITY): Payer: Self-pay

## 2022-08-15 ENCOUNTER — Encounter (HOSPITAL_COMMUNITY)
Admission: RE | Admit: 2022-08-15 | Discharge: 2022-08-15 | Disposition: A | Discharge status: Home / Self Care | Payer: Medicare HMO | Source: Ambulatory Visit | Attending: Pulmonary Disease | Admitting: Pulmonary Disease

## 2022-08-15 VITALS — Wt 195.1 lb

## 2022-08-15 DIAGNOSIS — J449 Chronic obstructive pulmonary disease, unspecified: Secondary | ICD-10-CM | POA: Diagnosis not present

## 2022-08-15 DIAGNOSIS — Z5189 Encounter for other specified aftercare: Secondary | ICD-10-CM | POA: Diagnosis not present

## 2022-08-15 NOTE — Progress Notes (Signed)
Daily Session Note  Patient Details  Name: Diane [name] MRN: [id number] Date of Birth: Jul 27, 1940 Referring Provider:   [name] Pulmonary Rehab Walk Test from 06/23/2022 in [facility], [facility], & [facility]  Referring Provider Meier       Encounter Date: 08/15/2022  Check In:  Session Check In - 08/15/22 1534       Check-In   Supervising physician immediately available to respond to emergencies [facility] immediately available    Physician(s) Eric Form, NP    [facility]    Staff Present Janine Ores, RN, Quentin Ore, MS, ACSM-CEP, Exercise Physiologist;Other    Virtual Visit No    Medication changes reported     No    Fall or balance concerns reported    Yes    Comments Pt has bunion on left foot, wore 2 different shoes to class today, unstable    Tobacco Cessation No Change    Warm-up and Cool-down Performed as group-led instruction    Resistance Training Performed Yes    VAD Patient? No    PAD/SET Patient? No      Pain Assessment   Currently in Pain? No/denies    Pain Score 0-No pain    Multiple Pain Sites No             Capillary Blood Glucose: No results found for this or any previous visit (from the past 24 hour(s)).   Exercise Prescription Changes - 08/15/22 1500       Response to Exercise   Blood Pressure (Admit) 128/62    Blood Pressure (Exercise) 142/64    Blood Pressure (Exit) 130/52    Heart Rate (Admit) 55 bpm    Heart Rate (Exercise) 69 bpm    Heart Rate (Exit) 66 bpm    Oxygen Saturation (Admit) 95 %    Oxygen Saturation (Exercise) 91 %    Oxygen Saturation (Exit) 94 %    Rating of Perceived Exertion (Exercise) 11    Perceived Dyspnea (Exercise) 1    Duration Continue with 30 min of aerobic exercise without signs/symptoms of physical distress.    Intensity THRR unchanged      Progression   Progression Continue to progress workloads to maintain intensity  without signs/symptoms of physical distress.      Resistance Training   Training Prescription Yes    Weight red bands    Reps 10-15    Time 10 Minutes      NuStep   Level 3    SPM 50    Minutes 30    METs 2             Social History   Tobacco Use  Smoking Status Former   Years: 63   Types: Cigarettes   Quit date: 05/29/2009   Years since quitting: 13.2  Smokeless Tobacco Never    Goals Met:  Independence with exercise equipment Exercise tolerated well Strength training completed today  Goals Unmet:  Not Applicable  Comments: Service time is from 1323 to 1441    Dr. Rodman Pickle is Medical Director for Pulmonary Rehab at [facility].

## 2022-08-16 ENCOUNTER — Ambulatory Visit: Payer: Medicare HMO | Admitting: Podiatry

## 2022-08-16 ENCOUNTER — Telehealth: Payer: Self-pay | Admitting: *Deleted

## 2022-08-16 DIAGNOSIS — M10072 Idiopathic gout, left ankle and foot: Secondary | ICD-10-CM

## 2022-08-16 DIAGNOSIS — M7752 Other enthesopathy of left foot: Secondary | ICD-10-CM | POA: Diagnosis not present

## 2022-08-16 MED ORDER — COLCHICINE 0.6 MG PO TABS: 0.6000 mg | ORAL_TABLET | Freq: Every day | ORAL | 0 refills | Status: DC

## 2022-08-16 MED ORDER — MELOXICAM 15 MG PO TABS: 15.0000 mg | ORAL_TABLET | Freq: Every day | ORAL | 0 refills | Status: DC

## 2022-08-16 NOTE — Telephone Encounter (Signed)
Pharmacy is wanting to make the physician aware that there is a drug interaction between colchicine and another medication-Amiodarone(increase toxicity),did he want to change to something else or proceed with filling medication?Please advise.

## 2022-08-16 NOTE — Progress Notes (Signed)
Subjective:  Patient ID: Diane [name], female    DOB: 04-28-1941,  MRN: [id number]  Chief Complaint  Patient presents with   Toe Pain    Left foot pt stated that over the weekend her left big toe got really inflamed and swollen     82 y.o. female presents with the above complaint.  Patient presents with left bunion deformity with underlying gout flare/capsulitis.  She states painful to touch it came out of nowhere started on Friday and she could not put any weight down.  It is red hot swollen joint.  She does not have any history of gout she does not have family history of gout.  She eats red meats and moderate amount she does not eat alcohol.  She wanted to discuss treatment options for this pain scale now has calmed down to 5 out of 10.  Still painful   Review of Systems: Negative except as noted in the HPI. Denies N/V/F/Ch.  Past Medical History:  Diagnosis Date   Anxiety    Arthritis    back   Atrial fibrillation ([place])    Breast cancer ([place])    right breast cancer - 3 years ago   Chronic diastolic heart failure (HCC)    ECHO: 06/20/2017 - Grade 2 Diastolic Dysfunction    Coronary artery disease    Depression    Diabetes mellitus without complication (HCC)    diet   Dysrhythmia    afib   Fibromyalgia    Herniated disc    Hyperlipemia    Hypertension    Iron deficiency anemia    Longstanding persistent atrial fibrillation (HCC)    Lower back pain    Lumbar stenosis    L4-5   Personal history of chemotherapy    Personal history of radiation therapy    Pulmonary hypertension ([place])    S/P Maze operation for atrial fibrillation 05/09/2018   Complete bilateral atrial lesion set using bipolar radiofrequency and cryothermy ablation with clipping of LA appendage   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   Docetaxel/Cytoxan Q 3 Weeks x 4 cycles   Torn rotator cuff    Tricuspid regurgitation     Current Outpatient Medications:    colchicine 0.6 MG tablet, Take 1 tablet (0.6 mg total) by mouth daily., Disp: 30 tablet, Rfl: 0   acetaminophen (TYLENOL) 500 MG tablet, Take 500 mg by mouth every 6 (six) hours as needed (for pain.)., Disp: , Rfl:    albuterol (VENTOLIN HFA) 108 (90 Base) MCG/ACT inhaler, Inhale 2 puffs into the lungs every 6 (six) hours as needed for wheezing or shortness of breath., Disp: , Rfl:    amiodarone (PACERONE) 200 MG tablet, Take 100 mg by mouth every other day., Disp: , Rfl:    amoxicillin (AMOXIL) 500 MG capsule, Take 2,000 mg by mouth as needed (dental appointment)., Disp: , Rfl:    aspirin EC 81 MG tablet, Take 81 mg by mouth daily., Disp: , Rfl:    busPIRone (BUSPAR)  5 MG tablet, Take 5 mg by mouth 2 (two) times daily., Disp: , Rfl:    co-enzyme Q-10 30 MG capsule, Take 30 mg by mouth daily., Disp: , Rfl:    ELIQUIS 2.5 MG TABS tablet, Take 2.5 mg by mouth 2 (two) times daily., Disp: , Rfl:    ferrous sulfate 325 (65 FE) MG EC tablet, Take 325 mg by mouth once a week. Thursday's, Disp: , Rfl:    Fluticasone-Umeclidin-Vilant (TRELEGY ELLIPTA) 100-62.5-25 MCG/ACT AEPB, Inhale 1 puff into the lungs daily., Disp: 1 each, Rfl: 11   furosemide (LASIX) 40 MG tablet, Take 1 tablet (40 mg total) by mouth daily., Disp: 30 tablet, Rfl: 1   gabapentin (NEURONTIN) 300 MG capsule, Take 300 mg by mouth at bedtime. , Disp: , Rfl:    ketoconazole (NIZORAL) 2 % cream, Apply topically daily., Disp: , Rfl:    Lancets (ONETOUCH DELICA PLUS 123XX123) MISC, USE AS DIRECTED DAILY FOR 30 DAYS, Disp: , Rfl:    loratadine (CLARITIN) 10 MG  tablet, Take 10 mg by mouth daily as needed for allergies., Disp: , Rfl:    Multiple Vitamins-Minerals (CENTRUM SILVER 50+WOMEN) TABS, Take 1 tablet by mouth daily., Disp: , Rfl:    nitroGLYCERIN (NITROSTAT) 0.4 MG SL tablet, Place 0.4 mg under the tongue every 5 (five) minutes x 3 doses as needed for chest pain., Disp: , Rfl:    ONETOUCH VERIO test strip, FOR USE WHEN CHECKING BLOOD GLUCOSE ONCE DAILY ALTERNATING AM AND PM BEFORE MEALS FINGER STICK 90 DAYS, Disp: , Rfl: 3   pantoprazole (PROTONIX) 40 MG tablet, Take 1 tablet by mouth once daily, Disp: 90 tablet, Rfl: 0   pravastatin (PRAVACHOL) 10 MG tablet, Take 10 mg by mouth at bedtime., Disp: , Rfl:    spironolactone (ALDACTONE) 25 MG tablet, Take 12.5 mg by mouth every morning., Disp: , Rfl:   Social History   Tobacco Use  Smoking Status Former   Years: 73   Types: Cigarettes   Quit date: 05/29/2009   Years since quitting: 13.2  Smokeless Tobacco Never    Allergies  Allergen Reactions   Shellfish Allergy Anaphylaxis and Swelling    Other reaction(s): Other (See Comments)   Atorvastatin Calcium     Other Reaction(s): muscle pain   Rosuvastatin Calcium Other (See Comments)    Other Reaction(s): myalgia   Shellfish-Derived Products Other (See Comments) and Swelling   Objective:  There were no vitals filed for this visit. There is no height or weight on file to calculate BMI. Constitutional Well developed. Well nourished.  Vascular Dorsalis pedis pulses palpable bilaterally. Posterior tibial pulses palpable bilaterally. Capillary refill normal to all digits.  No cyanosis or clubbing noted. Pedal hair growth normal.  Neurologic Normal speech. Oriented to person, place, and time. Epicritic sensation to light touch grossly present bilaterally.  Dermatologic Nails well groomed and normal in appearance. No open wounds. No skin lesions.  Orthopedic: Pain on palpation left first metatarsophalangeal joint pain with range of  motion of the joint bunion deformity noted moderate in nature this is a track bound not a tracking deformity.  No deep intra-articular first MPJ pain noted.   Radiographs: None Assessment:   1. Capsulitis of metatarsophalangeal (MTP) joint of left foot   2. Acute idiopathic gout involving toe of left foot    Plan:  Patient was evaluated and treated and all questions answered.  Left first MTP capsulitis with underlying gout flare -All questions and concerns were discussed with the  patient in extensive detail given the amount of pain that she is having She will benefit from a steroid injection to help decrease inflammatory component associate with pain -A steroid injection was performed at left first MTP using 1% plain Lidocaine and 10 mg of Kenalog. This was well tolerated. -I discussed diet management for gout she states understanding will improve her diet I discussed shoe gear modification as well  No follow-ups on file.

## 2022-08-17 ENCOUNTER — Encounter (HOSPITAL_COMMUNITY)
Admission: RE | Admit: 2022-08-17 | Discharge: 2022-08-17 | Disposition: A | Discharge status: Home / Self Care | Payer: Medicare HMO | Source: Ambulatory Visit | Attending: Pulmonary Disease | Admitting: Pulmonary Disease

## 2022-08-17 DIAGNOSIS — Z5189 Encounter for other specified aftercare: Secondary | ICD-10-CM | POA: Diagnosis not present

## 2022-08-17 DIAGNOSIS — J449 Chronic obstructive pulmonary disease, unspecified: Secondary | ICD-10-CM

## 2022-08-17 NOTE — Progress Notes (Signed)
Daily Session Note  Patient Details  Name: Diane [name] MRN: [id number] Date of Birth: 1940-12-03 Referring Provider:   [name] Pulmonary Rehab Walk Test from 06/23/2022 in [facility], [facility], & [facility]  Referring Provider Meier       Encounter Date: 08/17/2022  Check In:  Session Check In - 08/17/22 1527       Check-In   Supervising physician immediately available to respond to emergencies [facility] immediately available    Physician(s) Dr Marlyce Huge    [facility]    Staff Present Janine Ores, RN, Quentin Ore, MS, ACSM-CEP, Exercise Physiologist;Other;Samantha [place], RD, LDN    Virtual Visit No    Medication changes reported     No    Fall or balance concerns reported    No    Tobacco Cessation No Change    Warm-up and Cool-down Performed as group-led instruction    Resistance Training Performed Yes    VAD Patient? No    PAD/SET Patient? No      Pain Assessment   Currently in Pain? No/denies    Multiple Pain Sites No             Capillary Blood Glucose: No results found for this or any previous visit (from the past 24 hour(s)).    Social History   Tobacco Use  Smoking Status Former   Years: 78   Types: Cigarettes   Quit date: 05/29/2009   Years since quitting: 13.2  Smokeless Tobacco Never    Goals Met:  Proper associated with RPD/PD & O2 Sat Independence with exercise equipment Exercise tolerated well No report of concerns or symptoms today Strength training completed today  Goals Unmet:  Not Applicable  Comments: Service time is from 1314 to 1444.    Dr. Rodman Pickle is Medical Director for Pulmonary Rehab at [facility]

## 2022-08-17 NOTE — Telephone Encounter (Signed)
Pharmacy has been updated.

## 2022-08-22 ENCOUNTER — Encounter (HOSPITAL_COMMUNITY)
Admission: RE | Admit: 2022-08-22 | Discharge: 2022-08-22 | Disposition: A | Discharge status: Home / Self Care | Payer: Medicare HMO | Source: Ambulatory Visit | Attending: Pulmonary Disease | Admitting: Pulmonary Disease

## 2022-08-22 DIAGNOSIS — Z5189 Encounter for other specified aftercare: Secondary | ICD-10-CM | POA: Diagnosis not present

## 2022-08-22 DIAGNOSIS — J449 Chronic obstructive pulmonary disease, unspecified: Secondary | ICD-10-CM | POA: Diagnosis not present

## 2022-08-22 NOTE — Progress Notes (Signed)
Daily Session Note  Patient Details  Name: Diane [name] MRN: [id number] Date of Birth: 10/03/1940 Referring Provider:   [name] Pulmonary Rehab Walk Test from 06/23/2022 in [facility], Vascular, & [facility]  Referring Provider Meier       Encounter Date: 08/22/2022  Check In:  Session Check In - 08/22/22 1444       Check-In   Supervising physician immediately available to respond to emergencies [facility] immediately available    Physician(s) Nevada Crane, PA    [facility]    Staff Present Janine Ores, RN, Quentin Ore, MS, ACSM-CEP, Exercise Physiologist;Randi Olen Cordial BS, ACSM-CEP, Exercise Physiologist;Casey Tamala Julian, RT    Virtual Visit No    Medication changes reported     Yes    Comments pt is taking meloxicam    Fall or balance concerns reported    No    Tobacco Cessation No Change    Warm-up and Cool-down Performed as group-led instruction    Resistance Training Performed Yes    VAD Patient? No    PAD/SET Patient? No      Pain Assessment   Currently in Pain? No/denies    Multiple Pain Sites No             Capillary Blood Glucose: No results found for this or any previous visit (from the past 24 hour(s)).    Social History   Tobacco Use  Smoking Status Former   Years: 50   Types: Cigarettes   Quit date: 05/29/2009   Years since quitting: 13.2  Smokeless Tobacco Never    Goals Met:  Proper associated with RPD/PD & O2 Sat Independence with exercise equipment Exercise tolerated well No report of concerns or symptoms today Strength training completed today  Goals Unmet:  Not Applicable  Comments: Service time is from 1316 to 1458.    Dr. Rodman Pickle is Medical Director for Pulmonary Rehab at [facility]

## 2022-08-24 ENCOUNTER — Encounter (HOSPITAL_COMMUNITY)
Admission: RE | Admit: 2022-08-24 | Discharge: 2022-08-24 | Disposition: A | Discharge status: Home / Self Care | Payer: Medicare HMO | Source: Ambulatory Visit | Attending: Pulmonary Disease | Admitting: Pulmonary Disease

## 2022-08-24 DIAGNOSIS — Z5189 Encounter for other specified aftercare: Secondary | ICD-10-CM | POA: Diagnosis not present

## 2022-08-24 DIAGNOSIS — J449 Chronic obstructive pulmonary disease, unspecified: Secondary | ICD-10-CM | POA: Diagnosis not present

## 2022-08-24 NOTE — Progress Notes (Signed)
Daily Session Note  Patient Details  Name: Diane [name] MRN: [id number] Date of Birth: [birth year] Referring Provider:   [name] Pulmonary Rehab Walk Test from 06/23/2022 in [facility] Heart, Vascular, & Lung Health  Referring Provider Meier       Encounter Date: 08/24/2022  Check In:  Session Check In - 08/24/22 1558       Check-In   Supervising physician immediately available to respond to emergencies [facility] immediately available    Physician(s) Dr Vaughan Browner    [facility]    Staff Present Janine Ores, RN, Quentin Ore, MS, ACSM-CEP, Exercise Physiologist;David Lilyan Punt, MS, ACSM-CEP, CCRP, Exercise Physiologist;Randi Yevonne Pax, ACSM-CEP, Exercise Physiologist;Lakishia Bourassa Tamala Julian, RT;Jetta Walker BS, ACSM-CEP, Exercise Physiologist    Virtual Visit No    Medication changes reported     No    Fall or balance concerns reported    No    Tobacco Cessation No Change    Warm-up and Cool-down Performed as group-led instruction    Resistance Training Performed Yes    VAD Patient? No    PAD/SET Patient? No      Pain Assessment   Currently in Pain? No/denies    Multiple Pain Sites No             Capillary Blood Glucose: No results found for this or any previous visit (from the past 24 hour(s)).    Social History   Tobacco Use  Smoking Status Former   Years: 20   Types: Cigarettes   Quit date: 05/29/2009   Years since quitting: 13.2  Smokeless Tobacco Never    Goals Met:  Proper associated with RPD/PD & O2 Sat Independence with exercise equipment Exercise tolerated well No report of concerns or symptoms today Strength training completed today  Goals Unmet:  Not Applicable  Comments: Service time is from 1308 to 1440.    Dr. Rodman Pickle is Medical Director for Pulmonary Rehab at [facility].

## 2022-08-29 ENCOUNTER — Encounter (HOSPITAL_COMMUNITY)
Admission: RE | Admit: 2022-08-29 | Discharge: 2022-08-29 | Disposition: A | Discharge status: Home / Self Care | Payer: Medicare HMO | Source: Ambulatory Visit | Attending: Pulmonary Disease | Admitting: Pulmonary Disease

## 2022-08-29 VITALS — Wt 195.5 lb

## 2022-08-29 DIAGNOSIS — J449 Chronic obstructive pulmonary disease, unspecified: Secondary | ICD-10-CM | POA: Insufficient documentation

## 2022-08-29 NOTE — Progress Notes (Signed)
Daily Session Note  Patient Details  Name: Diane [name] MRN: [id number] Date of Birth: [birth year] Referring Provider:   [name] Pulmonary Rehab Walk Test from 06/23/2022 in [place] for Heart, Vascular, & [place]  Referring Provider Meier       Encounter Date: 08/29/2022  Check In:  Session Check In - 08/29/22 1612       Check-In   Supervising physician immediately available to respond to emergencies [facility] immediately available    Physician(s) Ambrose Pancoast, NP    [facility]    Staff Present Josephina Shih BS, ACSM-CEP, Exercise Physiologist;Samantha [place], RD, Philip Aspen, MS, ACSM-CEP, Exercise Physiologist;Casey Marella Bile, RN, BSN;Carlette Carlton, RN, BSN    Virtual Visit No    Medication changes reported     No    Fall or balance concerns reported    No    Tobacco Cessation No Change    Warm-up and Cool-down Performed as group-led Higher education careers adviser Performed Yes    VAD Patient? No    PAD/SET Patient? No      Pain Assessment   Currently in Pain? No/denies             Capillary Blood Glucose: No results found for this or any previous visit (from the past 24 hour(s)).   Exercise Prescription Changes - 08/29/22 1600       Response to Exercise   Blood Pressure (Admit) 118/56    Blood Pressure (Exercise) 130/50    Blood Pressure (Exit) 126/60    Heart Rate (Admit) 60 bpm    Heart Rate (Exercise) 67 bpm    Heart Rate (Exit) 56 bpm    Oxygen Saturation (Admit) 94 %    Oxygen Saturation (Exercise) 91 %    Oxygen Saturation (Exit) 94 %    Rating of Perceived Exertion (Exercise) 13    Perceived Dyspnea (Exercise) 3    Duration Continue with 30 min of aerobic exercise without signs/symptoms of physical distress.    Intensity THRR unchanged      Progression   Progression Continue to progress workloads to maintain intensity without signs/symptoms of physical  distress.      Resistance Training   Training Prescription Yes    Weight blue bands    Reps 10-15    Time 10 Minutes      NuStep   Level 3    SPM 50    Minutes 30    METs 2             Social History   Tobacco Use  Smoking Status Former   Years: 15   Types: Cigarettes   Quit date: 05/29/2009   Years since quitting: 13.2  Smokeless Tobacco Never    Goals Met:  Independence with exercise equipment Exercise tolerated well No report of concerns or symptoms today Strength training completed today  Goals Unmet:  Not Applicable  Comments: Service time is from 1309 to 1450.    Dr. Rodman Pickle is Medical Director for Pulmonary Rehab at [facility].

## 2022-08-31 ENCOUNTER — Encounter (HOSPITAL_COMMUNITY)
Admission: RE | Admit: 2022-08-31 | Discharge: 2022-08-31 | Disposition: A | Discharge status: Home / Self Care | Payer: Medicare HMO | Source: Ambulatory Visit | Attending: Pulmonary Disease | Admitting: Pulmonary Disease

## 2022-08-31 DIAGNOSIS — J449 Chronic obstructive pulmonary disease, unspecified: Secondary | ICD-10-CM | POA: Diagnosis not present

## 2022-08-31 NOTE — Progress Notes (Addendum)
Daily Session Note  Patient Details  Name: Diane [name] MRN: [id number] Date of Birth: Apr 19, 1941 Referring Provider:   [name] Pulmonary Rehab Walk Test from 06/23/2022 in [facility] for Heart, Vascular, & [place]  Referring Provider Meier       Encounter Date: 08/31/2022  Check In:  Session Check In - 08/31/22 [place]       Check-In   Supervising physician immediately available to respond to emergencies [facility] immediately available    Physician(s) Coletta Memos, PA    [facility]    Staff Present Josephina Shih BS, ACSM-CEP, Exercise Physiologist;Samantha [place], RD, Philip Aspen, MS, ACSM-CEP, Exercise Physiologist;Bailey Pearline Cables, MS, Exercise Physiologist;Mary Margette Fast, RN, BSN;Jetta Walker BS, ACSM-CEP, Exercise Physiologist    Virtual Visit No    Medication changes reported     No    Fall or balance concerns reported    No    Tobacco Cessation No Change    Warm-up and Cool-down Performed as group-led instruction    Resistance Training Performed Yes    VAD Patient? No    PAD/SET Patient? No      Pain Assessment   Currently in Pain? No/denies    Pain Score 0-No pain    Multiple Pain Sites No             Capillary Blood Glucose: No results found for this or any previous visit (from the past 24 hour(s)).    Social History   Tobacco Use  Smoking Status Former   Years: 37   Types: Cigarettes   Quit date: 05/29/2009   Years since quitting: 13.2  Smokeless Tobacco Never    Goals Met:  Independence with exercise equipment Improved SOB with ADL's Exercise tolerated well Personal goals reviewed No report of concerns or symptoms today  Goals Unmet:  Not Applicable  Comments: Pt completed 6 min walk test and graduated. Service time is from 1300 to 1330.    Dr. Rodman Pickle is Medical Director for Pulmonary Rehab at [facility].

## 2022-09-01 DIAGNOSIS — I340 Nonrheumatic mitral (valve) insufficiency: Secondary | ICD-10-CM | POA: Diagnosis not present

## 2022-09-01 DIAGNOSIS — E1122 Type 2 diabetes mellitus with diabetic chronic kidney disease: Secondary | ICD-10-CM | POA: Diagnosis not present

## 2022-09-01 DIAGNOSIS — I10 Essential (primary) hypertension: Secondary | ICD-10-CM | POA: Diagnosis not present

## 2022-09-01 DIAGNOSIS — Z8679 Personal history of other diseases of the circulatory system: Secondary | ICD-10-CM | POA: Diagnosis not present

## 2022-09-01 NOTE — Progress Notes (Signed)
Discharge Progress Report  Patient Details  Name: [place] Drye Mckinnie MRN: [id number] Date of Birth: 30-Nov-1940 Referring Provider:   Doristine Devoid Pulmonary Rehab Walk Test from 06/23/2022 in [facility]  Referring Provider Meier        Number of Visits: 67  Reason for Discharge:  Patient has met program and personal goals.  Smoking History:  Social History   Tobacco Use  Smoking Status Former   Years: 50   Types: Cigarettes   Quit date: 05/29/2009   Years since quitting: 13.2  Smokeless Tobacco Never    Diagnosis:  Chronic obstructive pulmonary disease, unspecified COPD type  ADL [facility]:  Pulmonary Assessment Scores     Row Name 06/23/22 1354 08/24/22 0949 08/31/22 1611     ADL [facility]   ADL Phase -- Exit --   SOB Score total 49 58 --     CAT Score   CAT Score 22 26 --     mMRC Score   mMRC Score 3 -- 4            Initial Exercise Prescription:  Initial Exercise Prescription - 06/23/22 1400       Date of Initial Exercise RX and Referring Provider   Date 06/23/22    Referring Provider Meier    Expected Discharge Date 08/31/22      NuStep   Level 1    SPM 50    Minutes 30      Prescription Details   Frequency (times per week) 2    Duration Progress to 30 minutes of continuous aerobic without signs/symptoms of physical distress      Intensity   THRR 40-80% of Max Heartrate 56-113    Ratings of Perceived Exertion 11-13    Perceived Dyspnea 0-4      Progression   Progression Continue progressive overload as per policy without signs/symptoms or physical distress.      Resistance Training   Training Prescription Yes    Weight red bands    Reps 10-15             Discharge Exercise Prescription (Final Exercise Prescription Changes):  Exercise Prescription Changes - 08/29/22 1600       Response to Exercise   Blood Pressure (Admit) 118/56    Blood Pressure (Exercise) 130/50     Blood Pressure (Exit) 126/60    Heart Rate (Admit) 60 bpm    Heart Rate (Exercise) 67 bpm    Heart Rate (Exit) 56 bpm    Oxygen Saturation (Admit) 94 %    Oxygen Saturation (Exercise) 91 %    Oxygen Saturation (Exit) 94 %    Rating of Perceived Exertion (Exercise) 13    Perceived Dyspnea (Exercise) 3    Duration Continue with 30 min of aerobic exercise without signs/symptoms of physical distress.    Intensity THRR unchanged      Progression   Progression Continue to progress workloads to maintain intensity without signs/symptoms of physical distress.      Resistance Training   Training Prescription Yes    Weight blue bands    Reps 10-15    Time 10 Minutes      NuStep   Level 3    SPM 50    Minutes 30    METs 2             Functional Capacity:  6 Minute Walk     Row Name 06/23/22 1445 08/31/22  1606       6 Minute Walk   Phase Initial Discharge    Distance 600 feet 840 feet    Distance % Change -- 40 %    Distance Feet Change -- 240 ft    Walk Time 9 minutes 6 minutes    # of Rest Breaks 2  1:45-2:32, 3:15-3:44 2    MPH 1.14 1.59    METS 0.95 1.69    RPE 11 11    Perceived Dyspnea  2 2    VO2 Peak 3.33 5.91    Symptoms Yes (comment) Yes (comment)    Comments Rt hip pain chronic 5/10 rest due to back pain    Resting HR 59 bpm 61 bpm    Resting BP 120/50 152/56    Resting Oxygen Saturation  98 % 95 %    Exercise Oxygen Saturation  during 6 min walk 95 % 94 %    Max Ex. HR 82 bpm 90 bpm    Max Ex. BP 150/50 190/70    2 Minute Post BP 142/58 144/64      Interval HR   1 Minute HR 72 67    2 Minute HR 72 80    3 Minute HR 71 90    4 Minute HR 73 85    5 Minute HR 81 80    6 Minute HR 82 87    2 Minute Post HR 62 60    Interval Heart Rate? Yes Yes      Interval Oxygen   Interval Oxygen? Yes Yes    Baseline Oxygen Saturation % 98 % 95 %    1 Minute Oxygen Saturation % 95 % 98 %    1 Minute Liters of Oxygen 0 L 0 L    2 Minute Oxygen Saturation % 95  % 95 %    2 Minute Liters of Oxygen 0 L 0 L    3 Minute Oxygen Saturation % 96 % 96 %    3 Minute Liters of Oxygen 0 L 0 L    4 Minute Oxygen Saturation % 97 % 95 %    4 Minute Liters of Oxygen 0 L 0 L    5 Minute Oxygen Saturation % 98 % 96 %    5 Minute Liters of Oxygen 0 L 0 L    6 Minute Oxygen Saturation % 97 % 94 %    6 Minute Liters of Oxygen 0 L 0 L    2 Minute Post Oxygen Saturation % 98 % 97 %    2 Minute Post Liters of Oxygen 0 L 0 L             Psychological, QOL, Others - Outcomes: PHQ 2/9:    08/31/2022    4:11 PM 06/23/2022    1:35 PM 06/03/2018    1:51 PM 03/15/2018   10:29 AM 11/11/2013    9:40 AM  Depression screen PHQ 2/9  Decreased Interest 0 1 0 0 0  Down, Depressed, Hopeless 0 0 0 0 1  PHQ - 2 Score 0 1 0 0 1  Altered sleeping 2 0  3   Tired, decreased energy 2 1  3    Change in appetite 0 0  2   Feeling bad or failure about yourself  0 0  0   Trouble concentrating 0 0  0   Moving slowly or fidgety/restless 1 0  0   Suicidal thoughts 0 0  0  PHQ-9 Score Difficult doing work/chores Not difficult at all Not difficult at all  Not difficult at all     Quality of Life:   Personal Goals: Goals established at orientation with interventions provided to work toward goal.  Personal Goals and Risk Factors at Admission - 06/23/22 1349       Core Components/Risk Factors/Patient Goals on Admission    Weight Management Weight Maintenance    Intervention Weight Management: Provide education and appropriate resources to help participant work on and attain dietary goals.;Weight Management/Obesity: Establish reasonable short term and long term weight goals.    Admit Weight 199 lb (90.3 kg)    Expected Outcomes Weight Maintenance: Understanding of the daily nutrition guidelines, which includes 25-35% calories from fat, 7% or less cal from saturated fats, less than  cholesterol, less than 1.5gm of sodium, & 5 or more servings of fruits and vegetables  daily    Improve shortness of breath with ADL's Yes    Intervention Provide education, individualized exercise plan and daily activity instruction to help decrease symptoms of SOB with activities of daily living.    Expected Outcomes Short Term: Improve cardiorespiratory fitness to achieve a reduction of symptoms when performing ADLs    Increase knowledge of respiratory medications and ability to use respiratory devices properly  Yes    Intervention Provide education and demonstration as needed of appropriate use of medications, inhalers, and oxygen therapy.    Expected Outcomes Short Term: Achieves understanding of medications use. Understands that oxygen is a medication prescribed by physician. Demonstrates appropriate use of inhaler and oxygen therapy.;Long Term: Maintain appropriate use of medications, inhalers, and oxygen therapy.    Stress Yes    Intervention Offer individual and/or small group education and counseling on adjustment to heart disease, stress management and health-related lifestyle change. Teach and support self-help strategies.;Refer participants experiencing significant psychosocial distress to appropriate mental health specialists for further evaluation and treatment. When possible, include family members and significant others in education/counseling sessions.    Expected Outcomes Short Term: Participant demonstrates changes in health-related behavior, relaxation and other stress management skills, ability to obtain effective social support, and compliance with psychotropic medications if prescribed.;Long Term: Emotional wellbeing is indicated by absence of clinically significant psychosocial distress or social isolation.              Personal Goals Discharge:  Goals and Risk Factor Review     Row Name 07/07/22 1648 08/04/22 1547           Core Components/Risk Factors/Patient Goals Review   Personal Goals Review Weight Management/Obesity;Improve shortness of breath  with ADL's;Develop more efficient breathing techniques such as purse lipped breathing and diaphragmatic breathing and practicing self-pacing with activity.;Increase knowledge of respiratory medications and ability to use respiratory devices properly.;Stress;Lipids;Diabetes Weight Management/Obesity;Improve shortness of breath with ADL's;Develop more efficient breathing techniques such as purse lipped breathing and diaphragmatic breathing and practicing self-pacing with activity.;Increase knowledge of respiratory medications and ability to use respiratory devices properly.;Stress;Lipids;Diabetes      Review [place] has attended 2 PR sessions so far. She is exercising on the NuStep for 30 minutes. It is too soon to evaluate [place] for any progress. We will continue to support [place] to reach her goals. [place] has attended 10 PR sessions so far. She is currently exeecising on the Nustep and walking the track. She has been able to increase her workload and METs on the Nustep. Somedays she does better than others when  walking the track. [place] has attended classes on oxygen use, stress and energy conservation, warning signs and symptoms, sedentary lifestyle, and anatomy and physiology. [place] demonstrates pursed lip breathing when she gets SOB. [place] is working with our staff dietician to manage her weight. Her diabetes and cholesterol are currently under control. We will continue to monitor Camya's progress.      Expected Outcomes See admission goals See admission goals               Exercise Goals and Review:  Exercise Goals     Row Name 06/23/22 1352 07/11/22 1656 08/03/22 0942         Exercise Goals   Increase Physical Activity Yes Yes Yes     Intervention Provide advice, education, support and counseling about physical activity/exercise needs.;Develop an individualized exercise prescription for aerobic and resistive training based on initial evaluation findings, risk stratification,  comorbidities and participant's personal goals. Provide advice, education, support and counseling about physical activity/exercise needs.;Develop an individualized exercise prescription for aerobic and resistive training based on initial evaluation findings, risk stratification, comorbidities and participant's personal goals. Provide advice, education, support and counseling about physical activity/exercise needs.;Develop an individualized exercise prescription for aerobic and resistive training based on initial evaluation findings, risk stratification, comorbidities and participant's personal goals.     Expected Outcomes Short Term: Attend rehab on a regular basis to increase amount of physical activity.;Long Term: Exercising regularly at least 3-5 days a week.;Long Term: Add in home exercise to make exercise part of routine and to increase amount of physical activity. Short Term: Attend rehab on a regular basis to increase amount of physical activity.;Long Term: Exercising regularly at least 3-5 days a week.;Long Term: Add in home exercise to make exercise part of routine and to increase amount of physical activity. Short Term: Attend rehab on a regular basis to increase amount of physical activity.;Long Term: Exercising regularly at least 3-5 days a week.;Long Term: Add in home exercise to make exercise part of routine and to increase amount of physical activity.     Increase Strength and Stamina Yes Yes Yes     Intervention Provide advice, education, support and counseling about physical activity/exercise needs.;Develop an individualized exercise prescription for aerobic and resistive training based on initial evaluation findings, risk stratification, comorbidities and participant's personal goals. Provide advice, education, support and counseling about physical activity/exercise needs.;Develop an individualized exercise prescription for aerobic and resistive training based on initial evaluation findings,  risk stratification, comorbidities and participant's personal goals. Provide advice, education, support and counseling about physical activity/exercise needs.;Develop an individualized exercise prescription for aerobic and resistive training based on initial evaluation findings, risk stratification, comorbidities and participant's personal goals.     Expected Outcomes Short Term: Increase workloads from initial exercise prescription for resistance, speed, and METs.;Short Term: Perform resistance training exercises routinely during rehab and add in resistance training at home;Long Term: Improve cardiorespiratory fitness, muscular endurance and strength as measured by increased METs and functional capacity ( ) Short Term: Increase workloads from initial exercise prescription for resistance, speed, and METs.;Short Term: Perform resistance training exercises routinely during rehab and add in resistance training at home;Long Term: Improve cardiorespiratory fitness, muscular endurance and strength as measured by increased METs and functional capacity ( ) Short Term: Increase workloads from initial exercise prescription for resistance, speed, and METs.;Short Term: Perform resistance training exercises routinely during rehab and add in resistance training at home;Long Term: Improve cardiorespiratory fitness, muscular endurance and strength as measured by increased METs and functional  capacity ( )     Able to understand and use rate of perceived exertion (RPE) scale Yes Yes Yes     Intervention Provide education and explanation on how to use RPE scale Provide education and explanation on how to use RPE scale Provide education and explanation on how to use RPE scale     Expected Outcomes Short Term: Able to use RPE daily in rehab to express subjective intensity level;Long Term:  Able to use RPE to guide intensity level when exercising independently Short Term: Able to use RPE daily in rehab to express subjective  intensity level;Long Term:  Able to use RPE to guide intensity level when exercising independently Short Term: Able to use RPE daily in rehab to express subjective intensity level;Long Term:  Able to use RPE to guide intensity level when exercising independently     Able to understand and use Dyspnea scale Yes Yes Yes     Intervention Provide education and explanation on how to use Dyspnea scale Provide education and explanation on how to use Dyspnea scale Provide education and explanation on how to use Dyspnea scale     Expected Outcomes Short Term: Able to use Dyspnea scale daily in rehab to express subjective sense of shortness of breath during exertion;Long Term: Able to use Dyspnea scale to guide intensity level when exercising independently Short Term: Able to use Dyspnea scale daily in rehab to express subjective sense of shortness of breath during exertion;Long Term: Able to use Dyspnea scale to guide intensity level when exercising independently Short Term: Able to use Dyspnea scale daily in rehab to express subjective sense of shortness of breath during exertion;Long Term: Able to use Dyspnea scale to guide intensity level when exercising independently     Knowledge and understanding of Target Heart Rate Range (THRR) Yes Yes Yes     Intervention Provide education and explanation of THRR including how the numbers were predicted and where they are located for reference Provide education and explanation of THRR including how the numbers were predicted and where they are located for reference Provide education and explanation of THRR including how the numbers were predicted and where they are located for reference     Expected Outcomes Short Term: Able to state/look up THRR;Short Term: Able to use daily as guideline for intensity in rehab;Long Term: Able to use THRR to govern intensity when exercising independently Short Term: Able to state/look up THRR;Short Term: Able to use daily as guideline for  intensity in rehab;Long Term: Able to use THRR to govern intensity when exercising independently Short Term: Able to state/look up THRR;Short Term: Able to use daily as guideline for intensity in rehab;Long Term: Able to use THRR to govern intensity when exercising independently     Understanding of Exercise Prescription Yes Yes Yes     Intervention Provide education, explanation, and written materials on patient's individual exercise prescription Provide education, explanation, and written materials on patient's individual exercise prescription Provide education, explanation, and written materials on patient's individual exercise prescription     Expected Outcomes Short Term: Able to explain program exercise prescription;Long Term: Able to explain home exercise prescription to exercise independently Short Term: Able to explain program exercise prescription;Long Term: Able to explain home exercise prescription to exercise independently Short Term: Able to explain program exercise prescription;Long Term: Able to explain home exercise prescription to exercise independently              Exercise Goals Re-Evaluation:  Exercise Goals Re-Evaluation  Row Name 07/11/22 1656 08/03/22 0942           Exercise Goal Re-Evaluation   Exercise Goals Review Increase Physical Activity;Able to understand and use Dyspnea scale;Understanding of Exercise Prescription;Increase Strength and Stamina;Knowledge and understanding of Target Heart Rate Range (THRR);Able to understand and use rate of perceived exertion (RPE) scale Increase Physical Activity;Able to understand and use Dyspnea scale;Understanding of Exercise Prescription;Increase Strength and Stamina;Knowledge and understanding of Target Heart Rate Range (THRR);Able to understand and use rate of perceived exertion (RPE) scale      Comments Pt has completed 3 days of exercise. Pt exercised on recumbent stepper for 30 min, level 1, METs 2.0.  Pt is now working  on squats. She is beginning to slowly progress her exercise tolerance. will continue to monitor. Pt has completed 9 days of exercise. Pt is exercising on recumbent stepper for 15 min, level 2, METs 2.0.  She is then walking the track with the rollator for 15 min, METs 1.92. She is working on squats. She is beginning to slowly progress her exercise tolerance. will continue to monitor.      Expected Outcomes Through exercise at rehab and home, the patient will decrease shortness of breath with daily activities and feel confident in carrying out an exercise regimen at home. Through exercise at rehab and home, the patient will decrease shortness of breath with daily activities and feel confident in carrying out an exercise regimen at home.               Nutrition & Weight - Outcomes:   Post Biometrics - 08/31/22 1611        Post  Biometrics   Grip Strength 16 kg             Nutrition:  Nutrition Therapy & Goals - 08/24/22 1433       Nutrition Therapy   Diet Heart Healthy/Renal diet    Drug/Food Interactions Statins/Certain Fruits      Personal Nutrition Goals   Nutrition Goal Patient to improve diet quality by using the plate method as a daily guide for meal planning to include lean protein/plant protein, fruits, vegetables, whole grains, nonfat dairy as part of a balanced diet    Personal Goal #2 Patient to reduce sodium to 1500mg  per day    Comments Goals in progress. [place] reports normal appetite and maintains a typical eating routine/schedule. She reports eating a wide variety of fruits, vegetables, whole grains, and protein sources. 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 Her oral iron was increased at that appointment.  She has maintained her weight since starting with our program. Patient will continue to benefit from participation in pulmonary rehab for nutrition, exercise, and  lifestyle modification.      Intervention Plan   Intervention Prescribe, educate and counsel regarding individualized specific dietary modifications aiming towards targeted core components such as weight, hypertension, lipid management, diabetes, heart failure and other comorbidities.;Nutrition handout(s) given to patient.    Expected Outcomes Short Term Goal: Understand basic principles of dietary content, such as calories, fat, sodium, cholesterol and nutrients.;Long Term Goal: Adherence to prescribed nutrition plan.             Nutrition Discharge:  Nutrition Assessments - 08/24/22 1506       Rate Your Plate Scores   Post Score 53             Education  Questionnaire Score:  Knowledge Questionnaire Score - 08/24/22 0949       Knowledge Questionnaire Score   Post Score 14/18             Goals reviewed with patient; copy given to patient.

## 2022-09-13 ENCOUNTER — Ambulatory Visit
Admission: RE | Admit: 2022-09-13 | Discharge: 2022-09-13 | Disposition: A | Discharge status: Home / Self Care | Payer: Medicare HMO | Source: Ambulatory Visit | Attending: Internal Medicine | Admitting: Internal Medicine

## 2022-09-13 DIAGNOSIS — R234 Changes in skin texture: Secondary | ICD-10-CM

## 2022-09-13 DIAGNOSIS — N644 Mastodynia: Secondary | ICD-10-CM | POA: Diagnosis not present

## 2022-09-20 ENCOUNTER — Ambulatory Visit: Payer: Medicare HMO | Admitting: Podiatry

## 2022-09-20 ENCOUNTER — Encounter: Payer: Self-pay | Admitting: Podiatry

## 2022-09-20 DIAGNOSIS — M79676 Pain in unspecified toe(s): Secondary | ICD-10-CM

## 2022-09-20 DIAGNOSIS — B351 Tinea unguium: Secondary | ICD-10-CM

## 2022-09-20 DIAGNOSIS — E1140 Type 2 diabetes mellitus with diabetic neuropathy, unspecified: Secondary | ICD-10-CM

## 2022-09-20 NOTE — Progress Notes (Signed)
This patient returns to my office for at risk foot care.  This patient requires this care by a professional since this patient will be at risk due to having chronic kidney disease and diabetes.    This patient is unable to cut nails herself since the patient cannot reach her  nails.These nails are painful walking and wearing shoes.  This patient presents for at risk foot care today. Treated successfully for capsulitis by Dr.  Allena Katz.  General Appearance  Alert, conversant and in no acute stress.  Vascular  Dorsalis pedis are palpable  bilaterally.  Posterior tibial pulses  are weakly palpable.Capillary return is within normal limits  bilaterally. Temperature is within normal limits  bilaterally.  Neurologic  Senn-Weinstein monofilament wire test diminished   bilaterally. Muscle power within normal limits bilaterally.  Nails Thick disfigured discolored nails with subungual debris  from hallux to fifth toes bilaterally. No evidence of bacterial infection or drainage bilaterally.  Orthopedic  No limitations of motion  feet .  No crepitus or effusions noted.  No bony pathology or digital deformities noted.  HAV  B/L.    Skin  normotropic skin with no porokeratosis noted bilaterally.  No signs of infections or ulcers noted.     Onychomycosis  Pain in right toes  Pain in left toes  Consent was obtained for treatment procedures.   Mechanical debridement of nails 1-5  bilaterally performed with a nail nipper.  Filed with dremel without incident.     Return office visit   3 months                   Told patient to return for periodic foot care and evaluation due to potential at risk complications.   Helane Gunther DPM

## 2022-09-25 ENCOUNTER — Other Ambulatory Visit: Payer: Self-pay | Admitting: Internal Medicine

## 2022-09-25 DIAGNOSIS — Z1231 Encounter for screening mammogram for malignant neoplasm of breast: Secondary | ICD-10-CM

## 2022-10-01 NOTE — Progress Notes (Unsigned)
Synopsis: Referred for dyspnea by Emilio Aspen, *  Subjective:   PATIENT ID: Diane [name] GENDER: female DOB: 1940/07/07, MRN: [id number]  No chief complaint on file.  82yF history of AFs/p maze, breast cancer and SCC vulva, chronic diastolic heart failure, CAD, DM, MR s/p ring annuloplasty, PH/TR, emphysema, COPD, smoking  Dyspnea, hoarseness, trouble swallowing since there's been some mold coming out of her vents or black soot. She insists it feels like there's a blockage to airflow at the level of her throat. She has an albuterol inhaler which helps for a while. She also feels like when she's eating she'll get strangled by her saliva or food.    Interval HPI  Last visit trial trelegy, discussed TLC measures for throat clearing, protonix 30 min before dinner nightly.   Seen by ENT 06/06/22 no change  Just started trelegy a couple days ago maybe mild improvement.    PFTs stable --------------------------- Had just started trelegy  Encouraged to remain on ppi, work on throat clearing, referred to pulm rehab  No new chest imaging   Otherwise pertinent review of systems is negative.  Past Medical History:  Diagnosis Date   Anxiety    Arthritis    back   Atrial fibrillation (HCC)    Breast cancer (HCC)    right breast cancer - 3 years ago   Chronic diastolic heart failure (HCC)    ECHO: 06/20/2017 - Grade 2 Diastolic Dysfunction    Coronary artery disease    Depression    Diabetes mellitus without complication (HCC)    diet   Dysrhythmia    afib   Fibromyalgia    Herniated disc    Hyperlipemia    Hypertension    Iron deficiency anemia    Longstanding persistent atrial fibrillation (HCC)    Lower back pain    Lumbar stenosis    L4-5   Personal history of chemotherapy    Personal history of radiation therapy    Pulmonary hypertension (HCC)    S/P Maze operation for atrial fibrillation 05/09/2018   Complete bilateral atrial lesion set using  bipolar radiofrequency and cryothermy ablation with clipping of LA appendage   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   Docetaxel/Cytoxan Q 3 Weeks x 4 cycles   Torn rotator cuff    Tricuspid regurgitation      Family History  Problem Relation Age of Onset   Stroke Mother    Alzheimer's disease Mother    Heart attack Father    Heart attack Brother    Breast cancer Neg Hx    Colon cancer Neg Hx    Ovarian cancer Neg Hx    Endometrial cancer Neg Hx    Pancreatic cancer Neg Hx    Prostate cancer Neg Hx      Past Surgical History:  Procedure Laterality Date   ABDOMINAL HYSTERECTOMY     BREAST BIOPSY Right 09/04/2012   BREAST LUMPECTOMY Right 10/08/2012   CLIPPING OF ATRIAL APPENDAGE N/A 05/09/2018  Procedure: CLIPPING OF ATRIAL APPENDAGE USING ATRICLIP PRO2 CLIP SIZE ;  Surgeon: Purcell Nails, MD;  Location: [facility] OR;  Service: Open Heart Surgery;  Laterality: N/A;   COLONOSCOPY     DILATION AND CURETTAGE OF UTERUS     LEFT HEART CATHETERIZATION WITH CORONARY ANGIOGRAM N/A 09/14/2014   Procedure: LEFT HEART CATHETERIZATION WITH CORONARY ANGIOGRAM;  Surgeon: Rinaldo Cloud, MD;  Location: [facility] CATH LAB;  Service: Cardiovascular;  Laterality: N/A;   MAZE N/A 05/09/2018   Procedure: MAZE;  Surgeon: Purcell Nails, MD;  Location: [facility] OR;  Service: Open Heart Surgery;  Laterality: N/A;   MITRAL VALVE REPAIR N/A 05/09/2018   Procedure: MITRAL VALVE REPAIR (MVR) USING MEMO 4D RING SIZE ;  Surgeon: Purcell Nails, MD;  Location: [facility] OR;  Service: Open Heart Surgery;   Laterality: N/A;   OPEN REDUCTION INTERNAL FIXATION (ORIF) PROXIMAL PHALANX Right 01/04/2016   Procedure: OPEN TREATMENT OF RIGHT THUMB PROXIMAL PHALANX FRACTURE;  Surgeon: Mack Hook, MD;  Location: [facility];  Service: Orthopedics;  Laterality: Right;   PARTIAL MASTECTOMY WITH NEEDLE LOCALIZATION AND AXILLARY SENTINEL LYMPH NODE BX Right 10/08/2012   Procedure: RIGHT PARTIAL MASTECTOMY WITH NEEDLE LOCALIZATION AND RIGHT AXILLARY SENTINEL LYMPH NODE BX;  Surgeon: Ernestene Mention, MD;  Location: [facility] OR;  Service: General;  Laterality: Right;  Injection of 1% Methylene Blue dye into right breast   PORTACATH PLACEMENT Left 10/08/2012   Procedure: INSERTION PORT-A-CATH WITH ULTRASOUND GUIDANCE;  Surgeon: Ernestene Mention, MD;  Location: [facility] OR;  Service: General;  Laterality: Left;  Using 90F Powerport Clearvue   Posterior Lumbar Arthrodesis, Pedicle screw fixation, Posterolateral Arthodesis  03/17/11   RIGHT/LEFT HEART CATH AND CORONARY ANGIOGRAPHY N/A 05/02/2018   Procedure: RIGHT/LEFT HEART CATH AND CORONARY ANGIOGRAPHY;  Surgeon: Rinaldo Cloud, MD;  Location: [facility] INVASIVE CV LAB;  Service: Cardiovascular;  Laterality: N/A;   SHOULDER ARTHROSCOPY WITH ROTATOR CUFF REPAIR AND SUBACROMIAL DECOMPRESSION  06/04/2012   Procedure: SHOULDER ARTHROSCOPY WITH ROTATOR CUFF REPAIR AND SUBACROMIAL DECOMPRESSION;  Surgeon: Mable Paris, MD;  Location: [facility];  Service: Orthopedics;  Laterality: Left;  LEFT SHOULDER ARTHROSCOPY WITH ROTATOR CUFF REPAIR AND SUBACROMIAL DECOMPRESSION AND DISTAL CLAVICLE RESECTION   TEE WITHOUT CARDIOVERSION N/A 05/03/2018   Procedure: TRANSESOPHAGEAL ECHOCARDIOGRAM (TEE);  Surgeon: Orpah Cobb, MD;  Location: [facility] ENDOSCOPY;  Service: Cardiovascular;  Laterality: N/A;   TEE WITHOUT CARDIOVERSION N/A 05/09/2018   Procedure: TRANSESOPHAGEAL ECHOCARDIOGRAM (TEE);  Surgeon: Purcell Nails, MD;  Location: [facility] OR;  Service: Open Heart Surgery;   Laterality: N/A;   TONSILLECTOMY     TRICUSPID VALVE REPLACEMENT N/A 05/09/2018   Procedure: TRICUSPID VALVE REPAIR USING MC3 RING SIZE ;  Surgeon: Purcell Nails, MD;  Location: [facility] OR;  Service: Open Heart Surgery;  Laterality: N/A;   TUBAL LIGATION     VULVECTOMY Left 02/06/2019   Procedure: POSTERIOR RADICAL VULVECTOMY;  Surgeon: Adolphus Birchwood, MD;  Location: [facility] ORS;  Service: Gynecology;  Laterality: Left;   VULVECTOMY PARTIAL     Wide Local Excision  01/2010   Bilateral groin excisions, re-excision of vulva    Social History   Socioeconomic History   Marital status: Widowed    Spouse name: Not on file   Number of children: 3   Years of education: Not on file   Highest education level: Not on file  Occupational History   Occupation: retired  Tobacco Use   Smoking status: Former    Years: 50  Types: Cigarettes    Quit date: 05/29/2009    Years since quitting: 13.3   Smokeless tobacco: Never  Vaping Use   Vaping Use: Never used  Substance and Sexual Activity   Alcohol use: Yes    Comment: rare   Drug use: No   Sexual activity: Not Currently  Other Topics Concern   Not on file  Social History Narrative   Tobacco use cigarettes: former smoker, quit in year Sept 2011, Pack-year Hx: 84, tobacco history last updated 10/10/2013. No smoking. Per patient stopped smoking 9 months ago Caffeine.: yes, 2+ servings daily, tea. No recreational drug use. Exercise: [employer] water [employer] (wants to start going again). Marital status: widow.         3 children, 5 grand and 4 great grands, all live in [place]      Likes to do word searches, read, watch news, daughter passed away last year      Lives in senior citizen community, neighbor keeps an eye on her   Social Determinants of Corporate investment banker Strain: Not on file  Food Insecurity: Not on file  Transportation Needs: Not on file  Physical Activity: Not on file  Stress: Not on file  Social Connections: Not on file   Intimate Partner Violence: Not on file     Allergies  Allergen Reactions   Shellfish Allergy Anaphylaxis and Swelling    Other reaction(s): Other (See Comments)   Atorvastatin Calcium     Other Reaction(s): muscle pain   Rosuvastatin Calcium Other (See Comments)    Other Reaction(s): myalgia   Shellfish-Derived Products Other (See Comments) and Swelling     Outpatient Medications Prior to Visit  Medication Sig Dispense Refill   acetaminophen (TYLENOL) 500 MG tablet Take 500 mg by mouth every 6 (six) hours as needed (for pain.).     albuterol (VENTOLIN HFA) 108 (90 Base) MCG/ACT inhaler Inhale 2 puffs into the lungs every 6 (six) hours as needed for wheezing or shortness of breath.     amiodarone (PACERONE) 200 MG tablet Take 100 mg by mouth every other day.     amoxicillin (AMOXIL) 500 MG capsule Take 2,000 mg by mouth as needed (dental appointment).     aspirin EC 81 MG tablet Take 81 mg by mouth daily.     busPIRone (BUSPAR) 5 MG tablet Take 5 mg by mouth 2 (two) times daily.     co-enzyme Q-10 30 MG capsule Take 30 mg by mouth daily.     colchicine 0.6 MG tablet Take 1 tablet (0.6 mg total) by mouth daily. 30 tablet 0   ELIQUIS 2.5 MG TABS tablet Take 2.5 mg by mouth 2 (two) times daily.     ferrous sulfate 325 (65 FE) MG EC tablet Take 325 mg by mouth once a week. Thursday's     Fluticasone-Umeclidin-Vilant (TRELEGY ELLIPTA) 100-62.5-25 MCG/ACT AEPB Inhale 1 puff into the lungs daily. 1 each 11   furosemide (LASIX) 40 MG tablet Take 1 tablet (40 mg total) by mouth daily. 30 tablet 1   gabapentin (NEURONTIN) 300 MG capsule Take 300 mg by mouth at bedtime.      ketoconazole (NIZORAL) 2 % cream Apply topically daily.     Lancets (ONETOUCH DELICA PLUS LANCET33G) MISC USE AS DIRECTED DAILY FOR 30 DAYS     loratadine (CLARITIN) 10 MG tablet Take 10 mg by mouth daily as needed for allergies.     meloxicam (MOBIC) 15 MG tablet Take 1 tablet (15  mg total) by mouth daily. 30 tablet 0    Multiple Vitamins-Minerals (CENTRUM SILVER 50+WOMEN) TABS Take 1 tablet by mouth daily.     nitroGLYCERIN (NITROSTAT) 0.4 MG SL tablet Place 0.4 mg under the tongue every 5 (five) minutes x 3 doses as needed for chest pain.     ONETOUCH VERIO test strip FOR USE WHEN CHECKING BLOOD GLUCOSE ONCE DAILY ALTERNATING AM AND PM BEFORE MEALS FINGER STICK 90 DAYS  3   pantoprazole (PROTONIX) 40 MG tablet Take 1 tablet by mouth once daily 90 tablet 0   pravastatin (PRAVACHOL) 10 MG tablet Take 10 mg by mouth at bedtime.     spironolactone (ALDACTONE) 25 MG tablet Take 12.5 mg by mouth every morning.     No facility-administered medications prior to visit.       Objective:   Physical Exam:  General appearance: 82 y.o., female, NAD, conversant  Eyes: anicteric sclerae; PERRL, tracking appropriately HENT: NCAT; MMM, hoarse, mallampati 4 Neck: Trachea midline; no lymphadenopathy, no JVD Lungs: CTAB, no crackles, no wheeze, with normal respiratory effort CV: RRR, no murmur  Abdomen: Soft, non-tender; non-distended, BS present  Extremities: No peripheral edema, warm Skin: Normal turgor and texture; no rash Psych: Appropriate affect Neuro: Alert and oriented to person and place, no focal deficit     There were no vitals filed for this visit.      on RA BMI Readings from Last 3 Encounters:  08/29/22 30.63 kg/m  08/15/22 30.56 kg/m  08/03/22 31.18 kg/m   Wt Readings from Last 3 Encounters:  08/29/22 195 lb 8.8 oz (88.7 kg)  08/15/22 195 lb 1.7 oz (88.5 kg)  08/03/22 199 lb 1.2 oz (90.3 kg)     CBC    Component Value Date/Time   WBC 6.7 04/19/2022 1328   RBC 3.24 (L) 04/19/2022 1328   HGB 8.8 Repeated and verified X2. (L) 04/19/2022 1328   HGB 10.6 (L) 01/11/2015 1054   HCT 27.6 (L) 04/19/2022 1328   HCT 33.1 (L) 01/11/2015 1054   PLT 244.0 04/19/2022 1328   PLT 207 01/11/2015 1054   MCV 85.2 04/19/2022 1328   MCV 84.1 01/11/2015 1054   MCH 27.0 02/19/2021 1542   MCHC  31.8 04/19/2022 1328   RDW 16.3 (H) 04/19/2022 1328   RDW 16.0 (H) 01/11/2015 1054   LYMPHSABS 2.2 04/19/2022 1328   LYMPHSABS 1.4 01/11/2015 1054   MONOABS 0.6 04/19/2022 1328   MONOABS 0.5 01/11/2015 1054   EOSABS 0.1 04/19/2022 1328   EOSABS 0.1 01/11/2015 1054   BASOSABS 0.0 04/19/2022 1328   BASOSABS 0.1 01/11/2015 1054    Chest Imaging: CXR 02/19/21 reviewed by me with prominent vasculature, cardiomegaly  CTA Dissection protocol 05/06/18 reviewed by me with mosaic attenuation, cardiomegaly, small stable right effusion  Pulmonary Functions Testing Results:    Latest Ref Rng & Units 06/14/2022   10:47 AM 02/06/2018   11:44 AM  PFT Results  FVC-Pre L 1.35  1.26   FVC-Predicted Pre % 46  51   FVC-Post L 1.43  1.32   FVC-Predicted Post % 49  54   Pre FEV1/FVC % % 83  83   Post FEV1/FCV % % 82  87   FEV1-Pre L 1.12  1.05   FEV1-Predicted Pre % 51  55   FEV1-Post L 1.17  1.15   DLCO uncorrected ml/min/mmHg 8.94  11.02   DLCO UNC% % 44  40   DLCO corrected ml/min/mmHg 8.94  11.96   DLCO COR %  Predicted % 44  44   DLVA Predicted % 85  77   TLC L  4.11   TLC % Predicted %  76   RV % Predicted %  95    PFT 06/14/22 stable  PFT 2019 with Mixed moderate obstruction (FEV1/SVC 57, FEV1 post BD 61) and restriction, severely reduced diffusing capacity    Echocardiogram:   TTE 05/16/18:  - Left ventricle: The cavity size was normal. Systolic function was    normal. The estimated ejection fraction was in the range of 55%    to 60%. Wall motion was normal; there were no regional wall    motion abnormalities. Doppler parameters are consistent with    abnormal left ventricular relaxation (grade 1 diastolic    dysfunction).  - Aortic valve: There was mild to moderate regurgitation. Valve    area (VTI): 2.11 cm^2. Valve area (Vmax): 1.99 cm^2. Valve area    (Vmean): 2.15 cm^2.  - Mitral valve: Valve area by continuity equation (using LVOT    flow): 1.91 cm^2.  - Left atrium:  The atrium was mildly dilated.  - Atrial septum: No defect or patent foramen ovale was identified.  - Pulmonic valve: Peak gradient (S): 12 mm Hg.     Assessment & Plan:   # Dyspnea  # COPD gold functional group B  # Hoarseness # Globus sensation  Not sure how much her dyspnea may be due to untreated COPD vs cardiac issue vs deconditioning. Frequent throat clearing, globus sensation make me wonder if there is LPR/irritable larynx but hasn't improved a ton on ppi.  Plan: - continue protonix 40 mg 30 minutes before dinner EVERY night - start trelegy 1 puff once daily rinse mouth and gargle after use - albuterol as needed - discussed conservative measures to limit throat clearing  - referred to pulmonary rehab - RTC 3 months      Omar Person, MD  Pulmonary Critical Care 10/01/2022 9:02 AM

## 2022-10-02 ENCOUNTER — Ambulatory Visit (INDEPENDENT_AMBULATORY_CARE_PROVIDER_SITE_OTHER): Payer: Medicare HMO | Admitting: Student

## 2022-10-02 ENCOUNTER — Ambulatory Visit: Payer: Medicare HMO | Admitting: Student

## 2022-10-02 ENCOUNTER — Other Ambulatory Visit: Payer: Self-pay

## 2022-10-02 ENCOUNTER — Encounter: Payer: Self-pay | Admitting: Student

## 2022-10-02 VITALS — BP 140/60 | HR 60 | Temp 97.6°F | Ht 67.0 in | Wt 195.0 lb

## 2022-10-02 DIAGNOSIS — R490 Dysphonia: Secondary | ICD-10-CM | POA: Diagnosis not present

## 2022-10-02 DIAGNOSIS — J449 Chronic obstructive pulmonary disease, unspecified: Secondary | ICD-10-CM

## 2022-10-02 DIAGNOSIS — R0600 Dyspnea, unspecified: Secondary | ICD-10-CM | POA: Diagnosis not present

## 2022-10-02 DIAGNOSIS — Z87891 Personal history of nicotine dependence: Secondary | ICD-10-CM | POA: Diagnosis not present

## 2022-10-02 LAB — PULMONARY FUNCTION TEST
DL/VA % pred: 72 %
DL/VA: 2.89 ml/min/mmHg/L
DLCO cor % pred: 48 %
DLCO cor: 10.08 ml/min/mmHg
DLCO unc % pred: 48 %
DLCO unc: 10.08 ml/min/mmHg
FEF 25-75 Pre: 1.48 L/sec
FEF2575-%Pred-Pre: 90 %
FEV1-%Pred-Pre: 58 %
FEV1-Pre: 1.33 L
FEV1FVC-%Pred-Pre: 111 %
FEV6-%Pred-Pre: 56 %
FEV6-Pre: 1.62 L
FEV6FVC-%Pred-Pre: 105 %
FVC-%Pred-Pre: 53 %
FVC-Pre: 1.62 L
Pre FEV1/FVC ratio: 82 %
Pre FEV6/FVC Ratio: 100 %

## 2022-10-02 MED ORDER — TRELEGY ELLIPTA 100-62.5-25 MCG/ACT IN AEPB: 1.0000 | INHALATION_SPRAY | Freq: Every day | RESPIRATORY_TRACT | 11 refills | Status: DC

## 2022-10-02 NOTE — Progress Notes (Signed)
Spirometry and DLCO Performed Today.  

## 2022-10-02 NOTE — Patient Instructions (Signed)
-   low dose ct chest you'll be called to schedule - I'll see you in clinic afterward

## 2022-10-02 NOTE — Patient Instructions (Signed)
Spirometry and DLCO Performed Today.  

## 2022-10-17 LAB — AMB RESULTS CONSOLE CBG: Glucose: 94

## 2022-10-17 NOTE — Progress Notes (Signed)
Pt has PCP. No SDOH needs at this time. Asked SDOH questions verbally.

## 2022-10-19 NOTE — Progress Notes (Signed)
The patient attended 10/17/22 screening event where her blood sugar screening results were wnl and her BP results were 132/72. At the event the patient stated she did not have any SDOH needs. The pt noted she has insurance and a PCP. Per chart review the patient has a PCP and has seen the pcp on 10/18/22. No addidtional Health equity team support indicated at this time.

## 2022-10-30 ENCOUNTER — Other Ambulatory Visit: Payer: Self-pay | Admitting: Student

## 2022-11-03 ENCOUNTER — Ambulatory Visit
Admission: RE | Admit: 2022-11-03 | Discharge: 2022-11-03 | Disposition: A | Discharge status: Home / Self Care | Payer: Medicare HMO | Source: Ambulatory Visit | Attending: Internal Medicine | Admitting: Internal Medicine

## 2022-11-03 DIAGNOSIS — Z1231 Encounter for screening mammogram for malignant neoplasm of breast: Secondary | ICD-10-CM | POA: Diagnosis not present

## 2022-11-06 DIAGNOSIS — N184 Chronic kidney disease, stage 4 (severe): Secondary | ICD-10-CM | POA: Diagnosis not present

## 2022-11-07 DIAGNOSIS — N184 Chronic kidney disease, stage 4 (severe): Secondary | ICD-10-CM | POA: Diagnosis not present

## 2022-11-08 DIAGNOSIS — H524 Presbyopia: Secondary | ICD-10-CM | POA: Diagnosis not present

## 2022-11-08 DIAGNOSIS — Z961 Presence of intraocular lens: Secondary | ICD-10-CM | POA: Diagnosis not present

## 2022-11-08 DIAGNOSIS — E119 Type 2 diabetes mellitus without complications: Secondary | ICD-10-CM | POA: Diagnosis not present

## 2022-11-09 DIAGNOSIS — H524 Presbyopia: Secondary | ICD-10-CM | POA: Diagnosis not present

## 2022-11-13 DIAGNOSIS — E1122 Type 2 diabetes mellitus with diabetic chronic kidney disease: Secondary | ICD-10-CM | POA: Diagnosis not present

## 2022-11-13 DIAGNOSIS — I129 Hypertensive chronic kidney disease with stage 1 through stage 4 chronic kidney disease, or unspecified chronic kidney disease: Secondary | ICD-10-CM | POA: Diagnosis not present

## 2022-11-13 DIAGNOSIS — N184 Chronic kidney disease, stage 4 (severe): Secondary | ICD-10-CM | POA: Diagnosis not present

## 2022-11-13 DIAGNOSIS — D631 Anemia in chronic kidney disease: Secondary | ICD-10-CM | POA: Diagnosis not present

## 2022-11-13 DIAGNOSIS — N2581 Secondary hyperparathyroidism of renal origin: Secondary | ICD-10-CM | POA: Diagnosis not present

## 2022-12-01 DIAGNOSIS — N184 Chronic kidney disease, stage 4 (severe): Secondary | ICD-10-CM | POA: Diagnosis not present

## 2022-12-01 DIAGNOSIS — I700 Atherosclerosis of aorta: Secondary | ICD-10-CM | POA: Diagnosis not present

## 2022-12-01 DIAGNOSIS — E1122 Type 2 diabetes mellitus with diabetic chronic kidney disease: Secondary | ICD-10-CM | POA: Diagnosis not present

## 2022-12-01 DIAGNOSIS — R252 Cramp and spasm: Secondary | ICD-10-CM | POA: Diagnosis not present

## 2022-12-01 DIAGNOSIS — E1169 Type 2 diabetes mellitus with other specified complication: Secondary | ICD-10-CM | POA: Diagnosis not present

## 2022-12-01 DIAGNOSIS — I4819 Other persistent atrial fibrillation: Secondary | ICD-10-CM | POA: Diagnosis not present

## 2022-12-01 DIAGNOSIS — I5022 Chronic systolic (congestive) heart failure: Secondary | ICD-10-CM | POA: Diagnosis not present

## 2022-12-01 DIAGNOSIS — I340 Nonrheumatic mitral (valve) insufficiency: Secondary | ICD-10-CM | POA: Diagnosis not present

## 2022-12-01 DIAGNOSIS — F419 Anxiety disorder, unspecified: Secondary | ICD-10-CM | POA: Diagnosis not present

## 2022-12-01 DIAGNOSIS — E1142 Type 2 diabetes mellitus with diabetic polyneuropathy: Secondary | ICD-10-CM | POA: Diagnosis not present

## 2022-12-01 DIAGNOSIS — I10 Essential (primary) hypertension: Secondary | ICD-10-CM | POA: Diagnosis not present

## 2022-12-01 DIAGNOSIS — M2550 Pain in unspecified joint: Secondary | ICD-10-CM | POA: Diagnosis not present

## 2022-12-01 DIAGNOSIS — E782 Mixed hyperlipidemia: Secondary | ICD-10-CM | POA: Diagnosis not present

## 2022-12-01 DIAGNOSIS — D6869 Other thrombophilia: Secondary | ICD-10-CM | POA: Diagnosis not present

## 2022-12-01 DIAGNOSIS — E7800 Pure hypercholesterolemia, unspecified: Secondary | ICD-10-CM | POA: Diagnosis not present

## 2022-12-01 DIAGNOSIS — J449 Chronic obstructive pulmonary disease, unspecified: Secondary | ICD-10-CM | POA: Diagnosis not present

## 2022-12-21 ENCOUNTER — Telehealth: Payer: Self-pay | Admitting: *Deleted

## 2022-12-21 NOTE — Telephone Encounter (Signed)
Spoke with Ms. Hanak who called the office to make a 6 month follow up with Dr. Tamela Oddi. Pt was given an appointment on Wednesday, July 25 th. At 10:15 with 10:00 check in time. Pt agreed to date and time.

## 2022-12-25 ENCOUNTER — Ambulatory Visit: Payer: Medicare HMO | Admitting: Podiatry

## 2022-12-25 ENCOUNTER — Encounter: Payer: Self-pay | Admitting: Podiatry

## 2022-12-25 ENCOUNTER — Telehealth (HOSPITAL_COMMUNITY): Payer: Self-pay

## 2022-12-25 VITALS — BP 169/63 | HR 67

## 2022-12-25 DIAGNOSIS — M79676 Pain in unspecified toe(s): Secondary | ICD-10-CM

## 2022-12-25 DIAGNOSIS — B351 Tinea unguium: Secondary | ICD-10-CM | POA: Diagnosis not present

## 2022-12-25 DIAGNOSIS — E1140 Type 2 diabetes mellitus with diabetic neuropathy, unspecified: Secondary | ICD-10-CM | POA: Diagnosis not present

## 2022-12-25 NOTE — Progress Notes (Signed)
This patient returns to my office for at risk foot care.  This patient requires this care by a professional since this patient will be at risk due to having chronic kidney disease and diabetes.    This patient is unable to cut nails herself since the patient cannot reach her  nails.These nails are painful walking and wearing shoes.   Patient says she is now experiencing cramping and numbness especially to her left foot. This patient presents for at risk foot care today.   General Appearance  Alert, conversant and in no acute stress.  Vascular  Dorsalis pedis and .  Posterior tibial pulses  are weakly /absent palpable.Capillary return is within normal limits  bilaterally. Temperature is within normal limits  bilaterally.  Neurologic  Senn-Weinstein monofilament wire test diminished   bilaterally. Muscle power within normal limits bilaterally.  Nails Thick disfigured discolored nails with subungual debris  from hallux to fifth toes bilaterally. No evidence of bacterial infection or drainage bilaterally.  Orthopedic  No limitations of motion  feet .  No crepitus or effusions noted.  No bony pathology or digital deformities noted.  HAV  B/L.    Skin  normotropic skin with no porokeratosis noted bilaterally.  No signs of infections or ulcers noted.     Onychomycosis  Pain in right toes  Pain in left toes  Consent was obtained for treatment procedures.   Mechanical debridement of nails 1-5  bilaterally performed with a nail nipper.  Filed with dremel without incident.  Recommended vascular studies after diabetic exam.  Recommend she be evaluated by neurologist.   Return office visit   3 months                   Told patient to return for periodic foot care and evaluation due to potential at risk complications.   Helane Gunther DPM

## 2022-12-25 NOTE — Telephone Encounter (Signed)
Called pt to let them know about the Pulmonary Wellness program at [facility] Pt states they are interested. Will put referral in.

## 2022-12-27 ENCOUNTER — Ambulatory Visit (HOSPITAL_COMMUNITY)
Admission: RE | Admit: 2022-12-27 | Discharge: 2022-12-27 | Disposition: A | Discharge status: Home / Self Care | Payer: Medicare HMO | Source: Ambulatory Visit | Attending: Cardiovascular Disease | Admitting: Cardiovascular Disease

## 2022-12-27 DIAGNOSIS — E1140 Type 2 diabetes mellitus with diabetic neuropathy, unspecified: Secondary | ICD-10-CM | POA: Diagnosis not present

## 2022-12-27 DIAGNOSIS — M79672 Pain in left foot: Secondary | ICD-10-CM | POA: Insufficient documentation

## 2022-12-27 LAB — VAS US ABI WITH/WO TBI
Left ABI: 1.08
Right ABI: 1.05

## 2023-02-14 DIAGNOSIS — M542 Cervicalgia: Secondary | ICD-10-CM | POA: Diagnosis not present

## 2023-02-14 DIAGNOSIS — M79642 Pain in left hand: Secondary | ICD-10-CM | POA: Diagnosis not present

## 2023-02-21 ENCOUNTER — Encounter: Payer: Self-pay | Admitting: Obstetrics & Gynecology

## 2023-02-21 ENCOUNTER — Inpatient Hospital Stay: Payer: Medicare HMO | Attending: Obstetrics & Gynecology | Admitting: Obstetrics & Gynecology

## 2023-02-21 VITALS — BP 153/52 | HR 60 | Temp 98.1°F | Resp 18 | Wt 201.6 lb

## 2023-02-21 DIAGNOSIS — C519 Malignant neoplasm of vulva, unspecified: Secondary | ICD-10-CM | POA: Insufficient documentation

## 2023-02-21 NOTE — Progress Notes (Signed)
Follow Up Note: Gyn-Onc  Diane [name] 82 y.o. female  CC: She returns for a f/u visit   HPI: The oncology history was reviewed.  Interval History: She denies any new lesions, itching, leg/groin pain, urinary symptoms, leg swelling, weight loss or cough.   Less recurrent "boils" involving the buttocks.  W/U for the breast lump she had discovered at the last visit was neg per her report.     Review of Systems  Review of Systems  Constitutional:  Negative for malaise/fatigue and weight loss.  Respiratory:  Negative for shortness of breath and wheezing.   Cardiovascular:  Negative for chest pain and leg swelling.  Gastrointestinal:  Negative for abdominal pain, blood in stool, constipation, nausea and vomiting.  Genitourinary:  Negative for dysuria, frequency, hematuria and urgency.  Musculoskeletal:  Negative for joint pain and myalgias.  Neurological:  Negative for weakness.  Psychiatric/Behavioral:  Negative for depression. The patient does not have insomnia.    Current medications, allergy, social history, past surgical history, past medical history, family history were all reviewed.    Vitals:  BP (!) 153/52   Pulse 60   Temp 98.1 F (36.7 C)   Resp 18   Wt 201 lb 9.6 oz (91.4 kg)   SpO2 94%   BMI 31.58 kg/m    Physical Exam Exam conducted with a chaperone present.  Constitutional:      General: She is not in acute distress. Breasts RUQ:no discrete mass--firm area involving lumpectomy scar, minimal skin puckering Cardiovascular:     Rate and Rhythm: Normal rate and regular rhythm.  Pulmonary:     Effort: Pulmonary effort is normal.     Breath sounds: Normal breath sounds. No wheezing or rhonchi.  Abdominal:     Palpations: Abdomen is soft.     Tenderness: There is no abdominal tenderness. There is no right CVA tenderness or left CVA tenderness.     Hernia: No hernia is present.  Genitourinary:    General: Normal vulva.     Urethra: No urethral lesion.      Vagina: No lesions. No bleeding.  Scant, thick, yellow discharge Musculoskeletal:     Cervical back: Neck supple.     Right lower leg: No edema.     Left lower leg: No edema.  Lymphadenopathy:     Upper Body:     Right upper body: No supraclavicular adenopathy.     Left upper body: No supraclavicular adenopathy.     Lower Body: No right inguinal adenopathy. No left inguinal adenopathy.  Skin:    Findings: No rash.  Neurological:     Mental Status: She is oriented to person, place, and time.   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    Antionette Char, MD

## 2023-02-21 NOTE — Assessment & Plan Note (Signed)
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

## 2023-02-21 NOTE — Patient Instructions (Addendum)
Return in 6 mos, call at the end of December or beginning of January to schedule for March

## 2023-03-02 DIAGNOSIS — Z8679 Personal history of other diseases of the circulatory system: Secondary | ICD-10-CM | POA: Diagnosis not present

## 2023-03-02 DIAGNOSIS — I10 Essential (primary) hypertension: Secondary | ICD-10-CM | POA: Diagnosis not present

## 2023-03-02 DIAGNOSIS — I340 Nonrheumatic mitral (valve) insufficiency: Secondary | ICD-10-CM | POA: Diagnosis not present

## 2023-03-02 DIAGNOSIS — E1122 Type 2 diabetes mellitus with diabetic chronic kidney disease: Secondary | ICD-10-CM | POA: Diagnosis not present

## 2023-03-19 DIAGNOSIS — I129 Hypertensive chronic kidney disease with stage 1 through stage 4 chronic kidney disease, or unspecified chronic kidney disease: Secondary | ICD-10-CM | POA: Diagnosis not present

## 2023-03-19 DIAGNOSIS — E1122 Type 2 diabetes mellitus with diabetic chronic kidney disease: Secondary | ICD-10-CM | POA: Diagnosis not present

## 2023-03-19 DIAGNOSIS — N184 Chronic kidney disease, stage 4 (severe): Secondary | ICD-10-CM | POA: Diagnosis not present

## 2023-03-28 ENCOUNTER — Ambulatory Visit: Payer: Medicare HMO | Admitting: Podiatry

## 2023-03-28 ENCOUNTER — Encounter: Payer: Self-pay | Admitting: Podiatry

## 2023-03-28 DIAGNOSIS — M79676 Pain in unspecified toe(s): Secondary | ICD-10-CM

## 2023-03-28 DIAGNOSIS — E1140 Type 2 diabetes mellitus with diabetic neuropathy, unspecified: Secondary | ICD-10-CM

## 2023-03-28 DIAGNOSIS — B351 Tinea unguium: Secondary | ICD-10-CM

## 2023-03-28 NOTE — Progress Notes (Signed)
This patient returns to my office for at risk foot care.  This patient requires this care by a professional since this patient will be at risk due to having chronic kidney disease and diabetes.    This patient is unable to cut nails herself since the patient cannot reach her  nails.These nails are painful walking and wearing shoes.   Patient says she is now experiencing cramping and numbness especially to her left foot. This patient presents for at risk foot care today.   General Appearance  Alert, conversant and in no acute stress.  Vascular  Dorsalis pedis and .  Posterior tibial pulses  are weakly /absent palpable.Capillary return is within normal limits  bilaterally. Temperature is within normal limits  bilaterally.  Neurologic  Senn-Weinstein monofilament wire test diminished   bilaterally. Muscle power within normal limits bilaterally.  Nails Thick disfigured discolored nails with subungual debris  from hallux to fifth toes bilaterally. No evidence of bacterial infection or drainage bilaterally.  Orthopedic  No limitations of motion  feet .  No crepitus or effusions noted.  No bony pathology or digital deformities noted.  HAV  B/L.    Skin  normotropic skin with no porokeratosis noted bilaterally.  No signs of infections or ulcers noted.     Onychomycosis  Pain in right toes  Pain in left toes  Consent was obtained for treatment procedures.   Mechanical debridement of nails 1-5  bilaterally performed with a nail nipper.  Filed with dremel without incident.  Results of her vascular study  WNL.   Return office visit   3 months                   Told patient to return for periodic foot care and evaluation due to potential at risk complications.   Helane Gunther DPM

## 2023-03-29 DIAGNOSIS — N184 Chronic kidney disease, stage 4 (severe): Secondary | ICD-10-CM | POA: Diagnosis not present

## 2023-03-29 DIAGNOSIS — D631 Anemia in chronic kidney disease: Secondary | ICD-10-CM | POA: Diagnosis not present

## 2023-03-29 DIAGNOSIS — I129 Hypertensive chronic kidney disease with stage 1 through stage 4 chronic kidney disease, or unspecified chronic kidney disease: Secondary | ICD-10-CM | POA: Diagnosis not present

## 2023-03-29 DIAGNOSIS — N2581 Secondary hyperparathyroidism of renal origin: Secondary | ICD-10-CM | POA: Diagnosis not present

## 2023-03-29 DIAGNOSIS — N189 Chronic kidney disease, unspecified: Secondary | ICD-10-CM | POA: Diagnosis not present

## 2023-03-29 DIAGNOSIS — E1122 Type 2 diabetes mellitus with diabetic chronic kidney disease: Secondary | ICD-10-CM | POA: Diagnosis not present

## 2023-04-06 DIAGNOSIS — E1122 Type 2 diabetes mellitus with diabetic chronic kidney disease: Secondary | ICD-10-CM | POA: Diagnosis not present

## 2023-04-06 DIAGNOSIS — I5022 Chronic systolic (congestive) heart failure: Secondary | ICD-10-CM | POA: Diagnosis not present

## 2023-04-06 DIAGNOSIS — I10 Essential (primary) hypertension: Secondary | ICD-10-CM | POA: Diagnosis not present

## 2023-04-06 DIAGNOSIS — M79642 Pain in left hand: Secondary | ICD-10-CM | POA: Diagnosis not present

## 2023-04-06 DIAGNOSIS — J449 Chronic obstructive pulmonary disease, unspecified: Secondary | ICD-10-CM | POA: Diagnosis not present

## 2023-04-06 DIAGNOSIS — I700 Atherosclerosis of aorta: Secondary | ICD-10-CM | POA: Diagnosis not present

## 2023-04-06 DIAGNOSIS — N184 Chronic kidney disease, stage 4 (severe): Secondary | ICD-10-CM | POA: Diagnosis not present

## 2023-04-06 DIAGNOSIS — D6869 Other thrombophilia: Secondary | ICD-10-CM | POA: Diagnosis not present

## 2023-04-06 DIAGNOSIS — M542 Cervicalgia: Secondary | ICD-10-CM | POA: Diagnosis not present

## 2023-04-10 ENCOUNTER — Ambulatory Visit: Payer: Medicare HMO

## 2023-04-10 ENCOUNTER — Encounter: Payer: Self-pay | Admitting: Adult Health

## 2023-04-10 ENCOUNTER — Ambulatory Visit: Payer: Medicare HMO | Admitting: Adult Health

## 2023-04-10 ENCOUNTER — Other Ambulatory Visit: Payer: Self-pay | Admitting: *Deleted

## 2023-04-10 VITALS — BP 128/64 | HR 67 | Temp 97.7°F | Ht 67.5 in | Wt 207.2 lb

## 2023-04-10 DIAGNOSIS — J849 Interstitial pulmonary disease, unspecified: Secondary | ICD-10-CM | POA: Diagnosis not present

## 2023-04-10 DIAGNOSIS — R0609 Other forms of dyspnea: Secondary | ICD-10-CM

## 2023-04-10 DIAGNOSIS — J449 Chronic obstructive pulmonary disease, unspecified: Secondary | ICD-10-CM | POA: Diagnosis not present

## 2023-04-10 DIAGNOSIS — R9389 Abnormal findings on diagnostic imaging of other specified body structures: Secondary | ICD-10-CM

## 2023-04-10 DIAGNOSIS — J441 Chronic obstructive pulmonary disease with (acute) exacerbation: Secondary | ICD-10-CM | POA: Diagnosis not present

## 2023-04-10 MED ORDER — PREDNISONE 10 MG PO TABS: ORAL_TABLET | ORAL | 0 refills | Status: DC

## 2023-04-10 MED ORDER — TRELEGY ELLIPTA 100-62.5-25 MCG/ACT IN AEPB: 1.0000 | INHALATION_SPRAY | Freq: Every day | RESPIRATORY_TRACT | 11 refills | Status: DC

## 2023-04-10 MED ORDER — ALBUTEROL SULFATE (2.5 MG/3ML) 0.083% IN NEBU
2.5000 mg | INHALATION_SOLUTION | Freq: Once | RESPIRATORY_TRACT | Status: AC
Start: 2023-04-10 — End: 2023-04-10
  Administered 2023-04-10: 2.5 mg via RESPIRATORY_TRACT

## 2023-04-10 MED ORDER — ALBUTEROL SULFATE HFA 108 (90 BASE) MCG/ACT IN AERS: 2.0000 | INHALATION_SPRAY | Freq: Four times a day (QID) | RESPIRATORY_TRACT | 3 refills | Status: AC | PRN

## 2023-04-10 NOTE — Progress Notes (Signed)
@Patient  ID: Diane [name], female    DOB: [birth year], 82 y.o.   MRN: [id number]  Chief Complaint  Patient presents with   Follow-up   Discussed the use of AI scribe software for clinical note transcription with the patient, who gave verbal consent to proceed.  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 Thora Lance    TEST/EVENTS :  Completed pulmonary rehab 2024   PFT 2019 with Mixed moderate obstruction (FEV1/SVC 57, FEV1 post BD 61) and restriction, severely reduced diffusing capacity   04/10/23 Follow up : COPD with emphysema   History of Present Illness   Diane [name], a 82 year old patient with a history of COPD with emphysema, presents for a six-month follow-up. The patient reports a recent exacerbation of symptoms, particularly over the past 4 days .  She describes an increased need for her albuterol inhaler, even after using her Trelegy inhaler. The patient reports experiencing wheezing for a while, but the shortness of breath has become more pronounced, necessitating the use of a walker for mobility due to the need to frequently stop and rest.   Medical history of kidney failure, atrial fibrillation managed with amiodarone and Eliquis, and reflux managed with pantoprazole. She also has a history of breast cancer treated with mastectomy, chemotherapy, and radiation approximately ten years ago, and valvular cancer treated with resection around four to five years ago.  The patient denies coughing up anything discolored, fever, coughing up blood, and unintentional weight loss. She reports no nasal congestion  or postnasal drainage. The patient does not use oxygen     Diane [name], a 82 year old patient with a history of COPD with emphysema, presents for a six-month follow-up. The patient reports a recent exacerbation of symptoms, particularly over the past weekend. She describes an increased need for her albuterol inhaler, even after using her Trelegy inhaler. The patient reports experiencing wheezing for a while, but the shortness of breath has become more pronounced, necessitating the use of a walker for mobility due to the need to frequently stop and rest.  The patient had previously completed pulmonary rehabilitation earlier in the year and reported significant improvement. However, she notes a decline in her respiratory status since discontinuing gym workouts, likening it to her lungs 'collapsing again.' The patient also reports intermittent hoarseness, which has been evaluated by an ENT specialist with flexible laryngoscopy, revealing no masses or significant findings.  In addition to COPD, the patient has a history of kidney failure, atrial fibrillation managed with amiodarone and Eliquis, and reflux managed with pantoprazole. She also has a history of breast cancer treated with mastectomy, chemotherapy, and radiation approximately ten years ago, and valvular cancer treated with resection around four to five years ago.  The patient denies coughing up anything discolored, fever, coughing up blood, and unintentional weight loss. She reports no nasal congestion or postnasal drainage. The patient does not use oxygen and is not on insulin for diabetes. She reports leg swelling, but this is not a new symptom and has been previously evaluated with no significant findings. The patient is not on a sleep machine and does not have sleep apnea.          Allergies  Allergen Reactions  Shellfish Allergy Anaphylaxis and Swelling    Other reaction(s): Other (See Comments)   Atorvastatin Calcium     Other  Reaction(s): muscle pain   Rosuvastatin Calcium Other (See Comments)    Other Reaction(s): myalgia   Shellfish-Derived Products Other (See Comments) and Swelling    Immunization History  Administered Date(s) Administered   Influenza Split 03/21/2012, 04/11/2013, 03/14/2015, 01/30/2017, 02/16/2022   Influenza Whole 03/11/2008, 04/02/2009, 04/05/2010   Influenza, High Dose Seasonal PF 04/14/2014, 03/22/2016, 01/30/2017, 04/30/2018, 03/14/2019, 02/18/2020   Influenza-Unspecified 01/27/2022, 02/08/2023   PFIZER(Purple Top)SARS-COV-2 Vaccination 07/14/2019, 08/05/2019, 03/08/2020, 12/08/2020   PNEUMOCOCCAL CONJUGATE-20 02/16/2022   Pfizer Covid-19 Vaccine Bivalent Booster 80yrs & up 07/11/2021   Pneumococcal Conjugate-13 04/14/2014   Pneumococcal Polysaccharide-23 03/11/2008, 03/22/2011   Td 06/05/2006   Tdap 06/14/2021   Zoster Recombinant(Shingrix) 04/07/2017   Zoster, Live 01/30/2017, 04/07/2017, 06/07/2017    Past Medical History:  Diagnosis Date   Anxiety    Arthritis    back   Atrial fibrillation (HCC)    Breast cancer (HCC)    right breast cancer - 3 years ago   Chronic diastolic heart failure (HCC)    ECHO: 06/20/2017 - Grade 2 Diastolic Dysfunction    Coronary artery disease    Depression    Diabetes mellitus without complication (HCC)    diet   Dysrhythmia    afib   Fibromyalgia    Herniated disc    Hyperlipemia    Hypertension    Iron deficiency anemia    Longstanding persistent atrial fibrillation (HCC)    Lower back pain    Lumbar stenosis    L4-5   Personal history of chemotherapy    Personal history of radiation therapy    Pulmonary hypertension (HCC)    S/P Maze operation for atrial fibrillation 05/09/2018   Complete bilateral atrial lesion set using bipolar radiofrequency and cryothermy ablation with clipping of LA appendage   S/P mitral valve repair 05/09/2018   26 mm Sorin Memo 4D ring annuloplasty   S/P radiation therapy 01/23/2013-03/06/2013    Right breast / 46 Gy in 23 fractions / Right breast boost / 14 Gy in 7 fractions   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   Docetaxel/Cytoxan Q 3 Weeks x 4 cycles   Torn rotator cuff    Tricuspid regurgitation     Tobacco History: Social History   Tobacco Use  Smoking Status Former   Current packs/day: 0.00   Average packs/day: 1 pack/day for 50.0 years (50.0 ttl pk-yrs)   Types: Cigarettes   Start date: 05/30/1959   Quit date: 05/29/2009   Years since quitting: 13.8  Smokeless Tobacco Never   Counseling given: Not Answered   Outpatient Medications Prior to Visit  Medication Sig Dispense Refill   acetaminophen (TYLENOL) 500 MG tablet Take 500 mg by mouth every 6 (six) hours as needed (for pain.).     amiodarone (PACERONE) 200 MG tablet Take 100 mg by mouth every other day.     amoxicillin (AMOXIL) 500 MG capsule Take 2,000 mg by mouth as needed (dental appointment).     aspirin EC 81 MG tablet Take 81 mg by mouth daily.     busPIRone (BUSPAR) 5 MG tablet Take 5 mg by mouth 2 (two)  times daily.     co-enzyme Q-10 30 MG capsule Take 30 mg by mouth daily.     colchicine 0.6 MG tablet Take 1 tablet (0.6 mg total) by mouth daily. 30 tablet 0   ELIQUIS 2.5 MG TABS tablet Take 2.5 mg by mouth 2 (two) times daily.     ferrous sulfate 325 (65 FE) MG EC tablet Take 325 mg by mouth once a week. Thursday's     furosemide (LASIX) 40 MG tablet Take 1 tablet (40 mg total) by mouth daily. 30 tablet 1   gabapentin (NEURONTIN) 300 MG capsule Take 300 mg by mouth at bedtime.      ketoconazole (NIZORAL) 2 % cream Apply topically daily.     Lancets (ONETOUCH DELICA PLUS LANCET33G) MISC USE AS DIRECTED DAILY FOR 30 DAYS     loratadine (CLARITIN) 10 MG tablet Take 10 mg by mouth daily as needed  for allergies.     meloxicam (MOBIC) 15 MG tablet Take 1 tablet (15 mg total) by mouth daily. 30 tablet 0   Multiple Vitamins-Minerals (CENTRUM SILVER 50+WOMEN) TABS Take 1 tablet by mouth daily.     nitroGLYCERIN (NITROSTAT) 0.4 MG SL tablet Place 0.4 mg under the tongue every 5 (five) minutes x 3 doses as needed for chest pain.     ONETOUCH VERIO test strip FOR USE WHEN CHECKING BLOOD GLUCOSE ONCE DAILY ALTERNATING AM AND PM BEFORE MEALS FINGER STICK 90 DAYS  3   pantoprazole (PROTONIX) 40 MG tablet Take 1 tablet by mouth once daily 90 tablet 3   pravastatin (PRAVACHOL) 10 MG tablet Take 10 mg by mouth at bedtime.     spironolactone (ALDACTONE) 25 MG tablet Take 12.5 mg by mouth every morning.     albuterol (VENTOLIN HFA) 108 (90 Base) MCG/ACT inhaler Inhale 2 puffs into the lungs every 6 (six) hours as needed for wheezing or shortness of breath.     Fluticasone-Umeclidin-Vilant (TRELEGY ELLIPTA) 100-62.5-25 MCG/ACT AEPB Inhale 1 puff into the lungs daily. 1 each 11   No facility-administered medications prior to visit.     Review of Systems:   Constitutional:   No  weight loss, night sweats,  Fevers, chills, +fatigue, or  lassitude.  HEENT:   No headaches,  Difficulty swallowing,  Tooth/dental problems, or  Sore throat,                No sneezing, itching, ear ache, nasal congestion, post nasal drip,   CV:  No chest pain,  Orthopnea, PND, swelling in lower extremities, anasarca, dizziness, palpitations, syncope.   GI  No heartburn, indigestion, abdominal pain, nausea, vomiting, diarrhea, change in bowel habits, loss of appetite, bloody stools.   Resp:   No chest wall deformity  Skin: no rash or lesions.  GU: no dysuria, change in color of urine, no urgency or frequency.  No flank pain, no hematuria   MS:  No joint pain or swelling.  No decreased range of motion.  No back pain.    Physical Exam  BP 128/64 (BP Location: Left Arm, Patient Position: Sitting, Cuff Size: Normal)    Pulse 67   Temp 97.7 F (36.5 C) (Oral)   Ht 5' 7.5" (1.715 m)   Wt 207 lb 3.2 oz (94 kg)   SpO2 96%   BMI 31.97 kg/m   GEN: A/Ox3; pleasant , NAD, well nourished , rolling walker    HEENT:  [place]/AT,   NOSE-clear, THROAT-clear, no lesions, no postnasal drip or exudate noted.  NECK:  Supple w/ fair ROM; no JVD; normal carotid impulses w/o bruits; no thyromegaly or nodules palpated; no lymphadenopathy.    RESP  Clear  P & A; w/o, wheezes/ rales/ or rhonchi. no accessory muscle use, no dullness to percussion  CARD:  RRR, no m/r/g, no peripheral edema, pulses intact, no cyanosis or clubbing.  GI:   Soft & nt; nml bowel sounds; no organomegaly or masses detected.   Musco: Warm bil, no deformities or joint swelling noted.   Neuro: alert, no focal deficits noted.    Skin: Warm, no lesions or rashes    Lab Results:    BMET    Imaging: No results found.  albuterol (PROVENTIL) (2.5 MG/3ML) 0.083% nebulizer solution 2.5 mg     Date Action Dose Route User   04/10/2023 1141 Given 2.5 mg Nebulization Busick, Ashlyn T, CMA          Latest Ref Rng & Units 10/02/2022    2:18 PM 06/14/2022   10:47 AM 02/06/2018   11:44 AM  PFT Results  FVC-Pre L 1.62  1.35  1.26   FVC-Predicted Pre % 53  46  51   FVC-Post L  1.43  1.32   FVC-Predicted Post %  49  54   Pre FEV1/FVC % % 82  83  83   Post FEV1/FCV % %  82  87   FEV1-Pre L 1.33  1.12  1.05   FEV1-Predicted Pre % 58  51  55   FEV1-Post L  1.17  1.15   DLCO uncorrected ml/min/mmHg 10.08  8.94  11.02   DLCO UNC% % 48  44  40   DLCO corrected ml/min/mmHg 10.08  8.94  11.96   DLCO COR %Predicted % 48  44  44   DLVA Predicted % 72  85  77   TLC L   4.11   TLC % Predicted %   76   RV % Predicted %   95     No results found for: "NITRICOXIDE"      Assessment & Plan:  Assessment and Plan    COPD Exacerbation   Symptoms appear to be an an acute exacerbation of COPD  characterized by increased dyspnea, wheezing, and  frequent albuterol use, which has worsened over the past few days, likely due to environmental factors.  Will begin Steroid taper. Discussed the potential for increased blood glucose levels due to diabetes, we will start a prednisone taper (40 mg for 2 days, 30 mg for 2 days, 20 mg for 2 days, 10 mg for 2 days) Chest x-ray is pending. She was given albuterol nebulizer treatment, Refills for Trelegy and albuterol inhale send.  Will call with x-ray results. She was previously referred to the lung cancer CT screening program but was not considered a candidate due to her age at 31 and cancer diagnosis within the last 5 years.  Plan  Patient Instructions  Begin Prednisone taper over next week.  Continue on Trelegy 1 puff daily, rinse after use  Albuterol inhaler As needed   Claritin 10mg  At bedtime   Delsym 2 tsp Twice daily  As needed  cough  Protonix 40mg  daily  Follow up Dr. Judeth Horn in 3 months and As needed  (30 min slot)  Please contact office for sooner follow up if symptoms do not improve or worsen or seek emergency care       Atrial Fibrillation   She has chronic atrial fibrillation managed with amiodarone and  Eliquis, Keep follow up with Cardiology .    Gastroesophageal Reflux Disease (GERD)   She has chronic GERD managed with pantoprazole, presenting symptoms of hoarseness and dry throat, possibly exacerbated by reflux. We will continue pantoprazole daily  Previous ENT evaluation unrevealing  Chronic Kidney Disease   History of chronic kidney disease continue follow-up with primary care.  Diabetes Mellitus   Continue on current regimen.     General Health Maintenance   She is up-to-date on immunizations including flu, RSV, and COVID-19 vaccines. We will continue routine vaccinations as recommended.  Follow-up   We will schedule a follow-up in 3 months with Dr. Judeth Horn.  Please contact office for sooner follow up if symptoms do not improve or worsen or seek emergency  care.        Rubye Oaks, NP 04/10/2023

## 2023-04-10 NOTE — Patient Instructions (Addendum)
Begin Prednisone taper over next week.  Continue on Trelegy 1 puff daily, rinse after use  Albuterol inhaler As needed   Claritin 10mg  At bedtime   Delsym 2 tsp Twice daily  As needed  cough  Protonix 40mg  daily  Follow up Dr. Judeth Horn in 3 months and As needed  (30 min slot)  Please contact office for sooner follow up if symptoms do not improve or worsen or seek emergency care

## 2023-04-10 NOTE — Progress Notes (Signed)
Order placed for HR CT scan.

## 2023-04-18 ENCOUNTER — Emergency Department (HOSPITAL_COMMUNITY): Payer: Medicare HMO

## 2023-04-18 ENCOUNTER — Other Ambulatory Visit: Payer: Self-pay

## 2023-04-18 ENCOUNTER — Encounter (HOSPITAL_COMMUNITY): Payer: Self-pay

## 2023-04-18 ENCOUNTER — Observation Stay (HOSPITAL_COMMUNITY)
Admission: EM | Admit: 2023-04-18 | Discharge: 2023-04-19 | Disposition: A | Discharge status: Home / Self Care | Payer: Medicare HMO | Attending: Cardiovascular Disease | Admitting: Cardiovascular Disease

## 2023-04-18 ENCOUNTER — Ambulatory Visit (HOSPITAL_COMMUNITY)
Admission: EM | Admit: 2023-04-18 | Discharge: 2023-04-18 | Disposition: A | Discharge status: Home / Self Care | Payer: Medicare HMO | Attending: Internal Medicine | Admitting: Internal Medicine

## 2023-04-18 DIAGNOSIS — I5032 Chronic diastolic (congestive) heart failure: Secondary | ICD-10-CM | POA: Diagnosis not present

## 2023-04-18 DIAGNOSIS — E119 Type 2 diabetes mellitus without complications: Secondary | ICD-10-CM | POA: Diagnosis not present

## 2023-04-18 DIAGNOSIS — I249 Acute ischemic heart disease, unspecified: Principal | ICD-10-CM | POA: Diagnosis present

## 2023-04-18 DIAGNOSIS — I350 Nonrheumatic aortic (valve) stenosis: Secondary | ICD-10-CM | POA: Diagnosis not present

## 2023-04-18 DIAGNOSIS — R0789 Other chest pain: Secondary | ICD-10-CM | POA: Diagnosis not present

## 2023-04-18 DIAGNOSIS — E1122 Type 2 diabetes mellitus with diabetic chronic kidney disease: Secondary | ICD-10-CM | POA: Insufficient documentation

## 2023-04-18 DIAGNOSIS — Z87891 Personal history of nicotine dependence: Secondary | ICD-10-CM | POA: Diagnosis not present

## 2023-04-18 DIAGNOSIS — I4821 Permanent atrial fibrillation: Secondary | ICD-10-CM | POA: Diagnosis not present

## 2023-04-18 DIAGNOSIS — N184 Chronic kidney disease, stage 4 (severe): Secondary | ICD-10-CM | POA: Diagnosis not present

## 2023-04-18 DIAGNOSIS — Z853 Personal history of malignant neoplasm of breast: Secondary | ICD-10-CM | POA: Diagnosis not present

## 2023-04-18 DIAGNOSIS — Z85828 Personal history of other malignant neoplasm of skin: Secondary | ICD-10-CM | POA: Insufficient documentation

## 2023-04-18 DIAGNOSIS — R0989 Other specified symptoms and signs involving the circulatory and respiratory systems: Secondary | ICD-10-CM | POA: Diagnosis not present

## 2023-04-18 DIAGNOSIS — Z951 Presence of aortocoronary bypass graft: Secondary | ICD-10-CM | POA: Diagnosis not present

## 2023-04-18 DIAGNOSIS — R079 Chest pain, unspecified: Principal | ICD-10-CM

## 2023-04-18 DIAGNOSIS — Z952 Presence of prosthetic heart valve: Secondary | ICD-10-CM | POA: Insufficient documentation

## 2023-04-18 DIAGNOSIS — I25110 Atherosclerotic heart disease of native coronary artery with unstable angina pectoris: Secondary | ICD-10-CM | POA: Diagnosis not present

## 2023-04-18 DIAGNOSIS — I482 Chronic atrial fibrillation, unspecified: Secondary | ICD-10-CM | POA: Diagnosis not present

## 2023-04-18 DIAGNOSIS — I2510 Atherosclerotic heart disease of native coronary artery without angina pectoris: Secondary | ICD-10-CM | POA: Diagnosis not present

## 2023-04-18 DIAGNOSIS — I130 Hypertensive heart and chronic kidney disease with heart failure and stage 1 through stage 4 chronic kidney disease, or unspecified chronic kidney disease: Secondary | ICD-10-CM | POA: Diagnosis not present

## 2023-04-18 DIAGNOSIS — E876 Hypokalemia: Secondary | ICD-10-CM | POA: Diagnosis not present

## 2023-04-18 DIAGNOSIS — Z8673 Personal history of transient ischemic attack (TIA), and cerebral infarction without residual deficits: Secondary | ICD-10-CM | POA: Insufficient documentation

## 2023-04-18 LAB — COMPREHENSIVE METABOLIC PANEL
ALT: 26 U/L (ref 0–44)
AST: 21 U/L (ref 15–41)
Albumin: 3.5 g/dL (ref 3.5–5.0)
Alkaline Phosphatase: 58 U/L (ref 38–126)
Anion gap: 12 (ref 5–15)
BUN: 41 mg/dL — ABNORMAL HIGH (ref 8–23)
CO2: 23 mmol/L (ref 22–32)
Calcium: 8.8 mg/dL — ABNORMAL LOW (ref 8.9–10.3)
Chloride: 105 mmol/L (ref 98–111)
Creatinine, Ser: 2.09 mg/dL — ABNORMAL HIGH (ref 0.44–1.00)
GFR, Estimated: 24 mL/min — ABNORMAL LOW (ref >60)
Glucose, Bld: 86 mg/dL (ref 70–99)
Potassium: 3.4 mmol/L — ABNORMAL LOW (ref 3.5–5.1)
Sodium: 140 mmol/L (ref 135–145)
Total Bilirubin: 0.9 mg/dL (ref <1.2)
Total Protein: 7.1 g/dL (ref 6.5–8.1)

## 2023-04-18 LAB — CBC
HCT: 38.8 % (ref 36.0–46.0)
Hemoglobin: 12 g/dL (ref 12.0–15.0)
MCH: 27 pg (ref 26.0–34.0)
MCHC: 30.9 g/dL (ref 30.0–36.0)
MCV: 87.4 fL (ref 80.0–100.0)
Platelets: 172 10*3/uL (ref 150–400)
RBC: 4.44 MIL/uL (ref 3.87–5.11)
RDW: 15.3 % (ref 11.5–15.5)
WBC: 10.4 10*3/uL (ref 4.0–10.5)
nRBC: 0 % (ref 0.0–0.2)

## 2023-04-18 LAB — TROPONIN I (HIGH SENSITIVITY)
Troponin I (High Sensitivity): 11 ng/L (ref <18)
Troponin I (High Sensitivity): 12 ng/L (ref <18)

## 2023-04-18 MED ORDER — UMECLIDINIUM BROMIDE 62.5 MCG/ACT IN AEPB
1.0000 | INHALATION_SPRAY | Freq: Every day | RESPIRATORY_TRACT | Status: DC
  Administered 2023-04-19: 1 via RESPIRATORY_TRACT
  Filled 2023-04-18: qty 7

## 2023-04-18 MED ORDER — SODIUM CHLORIDE 0.9% FLUSH: 3.0000 mL | INTRAVENOUS | Status: DC | PRN

## 2023-04-18 MED ORDER — HEPARIN (PORCINE) 25000 UT/250ML-% IV SOLN
950.0000 [IU]/h | INTRAVENOUS | Status: DC
  Administered 2023-04-18: 1050 [IU]/h via INTRAVENOUS
  Filled 2023-04-18: qty 250

## 2023-04-18 MED ORDER — FLUTICASONE FUROATE-VILANTEROL 100-25 MCG/ACT IN AEPB
1.0000 | INHALATION_SPRAY | Freq: Every day | RESPIRATORY_TRACT | Status: DC
  Administered 2023-04-19: 1 via RESPIRATORY_TRACT
  Filled 2023-04-18: qty 28

## 2023-04-18 MED ORDER — ASPIRIN 300 MG RE SUPP: 300.0000 mg | RECTAL | Status: AC

## 2023-04-18 MED ORDER — ONDANSETRON HCL 4 MG/2ML IJ SOLN: 4.0000 mg | Freq: Four times a day (QID) | INTRAMUSCULAR | Status: DC | PRN

## 2023-04-18 MED ORDER — HEPARIN BOLUS VIA INFUSION
4000.0000 [IU] | Freq: Once | INTRAVENOUS | Status: AC
  Administered 2023-04-18: 4000 [IU] via INTRAVENOUS
  Filled 2023-04-18: qty 4000

## 2023-04-18 MED ORDER — ASPIRIN 81 MG PO CHEW
324.0000 mg | CHEWABLE_TABLET | ORAL | Status: AC
  Administered 2023-04-18: 324 mg via ORAL
  Filled 2023-04-18: qty 4

## 2023-04-18 MED ORDER — ALBUTEROL SULFATE (2.5 MG/3ML) 0.083% IN NEBU: 2.5000 mg | INHALATION_SOLUTION | Freq: Four times a day (QID) | RESPIRATORY_TRACT | Status: DC | PRN

## 2023-04-18 MED ORDER — AMIODARONE HCL 200 MG PO TABS
100.0000 mg | ORAL_TABLET | ORAL | Status: DC
  Administered 2023-04-18: 100 mg via ORAL
  Filled 2023-04-18: qty 1

## 2023-04-18 MED ORDER — PANTOPRAZOLE SODIUM 40 MG PO TBEC
40.0000 mg | DELAYED_RELEASE_TABLET | Freq: Every day | ORAL | Status: DC
  Administered 2023-04-18 – 2023-04-19 (×2): 40 mg via ORAL
  Filled 2023-04-18 (×2): qty 1

## 2023-04-18 MED ORDER — ASPIRIN 81 MG PO TBEC
81.0000 mg | DELAYED_RELEASE_TABLET | Freq: Every day | ORAL | Status: DC
  Administered 2023-04-19: 81 mg via ORAL
  Filled 2023-04-18: qty 1

## 2023-04-18 MED ORDER — ACETAMINOPHEN 325 MG PO TABS: 650.0000 mg | ORAL_TABLET | ORAL | Status: DC | PRN

## 2023-04-18 MED ORDER — PRAVASTATIN SODIUM 10 MG PO TABS
10.0000 mg | ORAL_TABLET | Freq: Every day | ORAL | Status: DC
  Administered 2023-04-18: 10 mg via ORAL
  Filled 2023-04-18: qty 1

## 2023-04-18 MED ORDER — SODIUM CHLORIDE 0.9% FLUSH
3.0000 mL | Freq: Two times a day (BID) | INTRAVENOUS | Status: DC
  Administered 2023-04-18 – 2023-04-19 (×2): 3 mL via INTRAVENOUS

## 2023-04-18 MED ORDER — NITROGLYCERIN 0.4 MG SL SUBL
0.4000 mg | SUBLINGUAL_TABLET | SUBLINGUAL | Status: DC | PRN
  Filled 2023-04-18: qty 1

## 2023-04-18 MED ORDER — ADULT MULTIVITAMIN W/MINERALS CH
1.0000 | ORAL_TABLET | Freq: Every day | ORAL | Status: DC
  Administered 2023-04-18 – 2023-04-19 (×2): 1 via ORAL
  Filled 2023-04-18 (×2): qty 1

## 2023-04-18 MED ORDER — COENZYME Q10 30 MG PO CAPS
30.0000 mg | ORAL_CAPSULE | Freq: Every day | ORAL | Status: DC
Start: 2023-04-18 — End: 2023-04-18

## 2023-04-18 MED ORDER — SODIUM CHLORIDE 0.9 % IV SOLN: 250.0000 mL | INTRAVENOUS | Status: DC | PRN

## 2023-04-18 MED ORDER — ALBUTEROL SULFATE HFA 108 (90 BASE) MCG/ACT IN AERS: 2.0000 | INHALATION_SPRAY | Freq: Four times a day (QID) | RESPIRATORY_TRACT | Status: DC | PRN

## 2023-04-18 NOTE — ED Provider Triage Note (Signed)
Emergency Medicine Provider Triage Evaluation Note  Diane [name] , a 82 y.o. female  was evaluated in triage.  Pt complains of chest pain.  Was at the dentist, he began experiencing chest pain.  Took 2 nitroglycerin.  Went to urgent care.  Not currently experiencing chest pain.  Review of Systems  Positive:  Negative:   Physical Exam  BP (!) 135/50 (BP Location: Left Arm)   Pulse (!) 59   Temp 98.3 F (36.8 C) (Oral)   Resp 16   Ht 5\' 7"  (1.702 m)   Wt 93.9 kg   SpO2 100%   BMI 32.42 kg/m  Gen:   Awake, no distress   Resp:  Normal effort  MSK:   Moves extremities without difficulty  Other:  CV-regular rate  Medical Decision Making  Medically screening exam initiated at 1:32 PM.  Appropriate orders placed.  Diane [name] was informed that the remainder of the evaluation will be completed by another provider, this initial triage assessment does not replace that evaluation, and the importance of remaining in the ED until their evaluation is complete.  EKG nonischemic.   Anders Simmonds T, DO 04/18/23 1334

## 2023-04-18 NOTE — ED Triage Notes (Signed)
Mid chest pain started this AM while at the dentist's office. Took Aspirin 81mg  and 2 nitroglycerin SL with no relief. Hx of heart valve replacement.

## 2023-04-18 NOTE — Progress Notes (Addendum)
PHARMACY - ANTICOAGULATION CONSULT NOTE  Pharmacy Consult for heparin infusion Indication: chest pain/ACS  Allergies  Allergen Reactions   Shellfish Allergy Anaphylaxis and Swelling    Other reaction(s): Other (See Comments)   Atorvastatin Calcium     Other Reaction(s): muscle pain   Rosuvastatin Calcium Other (See Comments)    Other Reaction(s): myalgia   Shellfish-Derived Products Other (See Comments) and Swelling    Patient Measurements: Height: 5\' 7"  (170.2 cm) Weight: 93.9 kg (207 lb 0.2 oz) IBW/kg (Calculated) : 61.6 Heparin Dosing Weight: 82.1 kg  Vital Signs: Temp: 97.9 F (36.6 C) (11/20 1652) Temp Source: Oral (11/20 1652) BP: 133/56 (11/20 1715) Pulse Rate: 63 (11/20 1715)  Labs: Recent Labs    04/18/23 1423 04/18/23 1731  HGB 12.0  --   HCT 38.8  --   PLT 172  --   CREATININE 2.09*  --   TROPONINIHS 11 12    Estimated Creatinine Clearance: 25.7 mL/min (A) (by C-G formula based on SCr of 2.09 mg/dL (H)).   Medical History: Past Medical History:  Diagnosis Date   Anxiety    Arthritis    back   Atrial fibrillation (HCC)    Breast cancer (HCC)    right breast cancer - 3 years ago   Chronic diastolic heart failure (HCC)    ECHO: 06/20/2017 - Grade 2 Diastolic Dysfunction    Coronary artery disease    Depression    Diabetes mellitus without complication (HCC)    diet   Dysrhythmia    afib   Fibromyalgia    Herniated disc    Hyperlipemia    Hypertension    Iron deficiency anemia    Longstanding persistent atrial fibrillation (HCC)    Lower back pain    Lumbar stenosis    L4-5   Personal history of chemotherapy    Personal history of radiation therapy    Pulmonary hypertension (HCC)    S/P Maze operation for atrial fibrillation 05/09/2018   Complete bilateral atrial lesion set using bipolar radiofrequency and cryothermy ablation with clipping of LA appendage   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   Docetaxel/Cytoxan Q 3 Weeks x 4 cycles   Torn rotator cuff    Tricuspid regurgitation    Assessment: 81yoF presenting from dentist with severe chest pain that radiated to back, improvement with nitroglycerin x2 taken prior to EMS. hsTnI 11 then 12 on repeat, CBC stable. Eliquis 2.5 mg BID prior to admission for afib, last fill Apr 06, 2023 & dose 11/19 around 1200 per patient. Will monitor aPTT levels until confirmed correlating.  Goal of Therapy:  Heparin level 0.3-0.7 units/ml aPTT 66-102 seconds Monitor platelets by anticoagulation protocol: Yes   Plan:  Give 4000 units bolus x 1 Start heparin infusion at 1050 units/hr Check anti-Xa level in 8 hours and daily while on heparin Continue to monitor H&H and platelets  Rutherford Nail, PharmD PGY2 Critical Care Pharmacy Resident 04/18/2023,8:32 PM

## 2023-04-18 NOTE — ED Provider Notes (Signed)
[place] EMERGENCY DEPARTMENT AT [facility] Provider Note   CSN: [id number] Arrival date & time: 04/18/23  1315     History {Add pertinent medical, surgical, social history, OB history to HPI:1} Chief Complaint  Patient presents with   Chest Pain    Diane [name] is a 82 y.o. female.   Chest Pain 82 year old female history of atrial fibrillation, prior mitral valve repair, prior tricuspid valve replacement presenting for chest pain.  Patient states around 1115 she was at her dentist office.  She was laying back when she developed severe pain in her chest.  She felt like it went all over her body but did not specifically radiate.  She tried nitro x 2 and that did not help.  Last about 30 minutes and then resolved.  It was better sitting up.  Not worse with exertion.  No nausea or vomiting or diaphoresis.  No shortness of breath or pleuritic pain.  She has not had pain like this before.  She is currently asymptomatic and has been for the last few hours.  She went to urgent care after trying a heart doctor and not narrowed to get a hold of them.  Urgent care sent her here.  She was catheterized and December 2019, she had LAD with 30% proximal stenosis, diagonal 1 with 40 to 50% mid stenosis, patent left main, RCA with 40% proximal and 15 to 20% mid stenoses.  No heart catheter stress testing that I can see since.  Follows with Dr. Sharyn Lull.     Home Medications Prior to Admission medications   Medication Sig Start Date End Date Taking? Authorizing Provider  acetaminophen (TYLENOL) 500 MG tablet Take 500 mg by mouth every 6 (six) hours as needed (for pain.).    [provider]  albuterol (VENTOLIN HFA) 108 (90 Base) MCG/ACT inhaler Inhale 2 puffs into the lungs every 6 (six) hours as needed for wheezing or shortness of breath. 04/10/23   Parrett, Virgel Bouquet, NP  amiodarone (PACERONE) 200 MG tablet Take 100 mg by mouth every other day. 07/06/20   [provider]  amoxicillin (AMOXIL) 500 MG capsule Take 2,000 mg by mouth as needed (dental appointment). 11/05/19   [provider]  aspirin EC 81 MG tablet Take 81 mg by mouth daily.    [provider]  busPIRone (BUSPAR) 5 MG tablet Take 5 mg by mouth 2 (two) times daily. 07/07/21   [provider]  co-enzyme Q-10 30 MG capsule Take 30 mg by mouth daily.    [provider]  colchicine 0.6 MG tablet Take 1 tablet (0.6 mg total) by mouth daily. 08/16/22   Candelaria Stagers, DPM  ELIQUIS 2.5 MG TABS tablet Take 2.5 mg by mouth 2 (two) times daily. 02/21/19   [provider]  ferrous sulfate 325 (65 FE) MG EC tablet Take 325 mg by mouth once a week. Thursday's 11/05/19   [provider]  Fluticasone-Umeclidin-Vilant (TRELEGY ELLIPTA) 100-62.5-25 MCG/ACT AEPB Inhale 1 puff into the lungs daily. 04/10/23   Parrett, Virgel Bouquet, NP  furosemide (LASIX) 40 MG tablet Take 1 tablet (40 mg total) by mouth daily. 05/20/18   Gold, Wayne E, PA-C  gabapentin (NEURONTIN) 300 MG capsule Take 300 mg by mouth at bedtime.  01/03/19   [provider]  ketoconazole (NIZORAL) 2 % cream Apply topically daily. 06/03/21   [provider]  Lancets (ONETOUCH DELICA PLUS LANCET33G) MISC USE AS DIRECTED DAILY FOR 30 DAYS 12/09/18  [provider]  loratadine (CLARITIN) 10 MG tablet Take 10 mg by mouth daily as needed for allergies.    [provider]  meloxicam (MOBIC) 15 MG tablet Take 1 tablet (15 mg total) by mouth daily. 08/16/22   Candelaria Stagers, DPM  Multiple Vitamins-Minerals (CENTRUM SILVER 50+WOMEN) TABS Take 1 tablet by mouth daily.    [provider]  nitroGLYCERIN (NITROSTAT) 0.4 MG SL tablet Place 0.4 mg under the tongue every 5 (five) minutes x 3 doses as needed for chest pain. 12/10/18   [provider]  ONETOUCH VERIO test strip FOR USE WHEN CHECKING BLOOD GLUCOSE ONCE DAILY ALTERNATING AM AND PM BEFORE MEALS FINGER STICK 90 DAYS  01/20/18   [provider]  pantoprazole (PROTONIX) 40 MG tablet Take 1 tablet by mouth once daily 10/30/22   Omar Person, MD  pravastatin (PRAVACHOL) 10 MG tablet Take 10 mg by mouth at bedtime. 10/19/19   [provider]  predniSONE (DELTASONE) 10 MG tablet 4 tabs for 2 days, then 3 tabs for 2 days, 2 tabs for 2 days, then 1 tab for 2 days, then stop 04/10/23   Parrett, Tammy S, NP  spironolactone (ALDACTONE) 25 MG tablet Take 12.5 mg by mouth every morning. 11/25/20   [provider]      Allergies    Shellfish allergy, Atorvastatin calcium, Rosuvastatin calcium, and Shellfish-derived products    Review of Systems   Review of Systems  Cardiovascular:  Positive for chest pain.  Review of systems completed and notable as per HPI.  ROS otherwise negative.   Physical Exam Updated Vital Signs BP (!) 129/54 (BP Location: Left Arm)   Pulse 64   Temp 97.9 F (36.6 C) (Oral)   Resp 15   Ht 5\' 7"  (1.702 m)   Wt 93.9 kg   SpO2 100%   BMI 32.42 kg/m  Physical Exam Vitals and nursing note reviewed.  Constitutional:      General: She is not in acute distress.    Appearance: She is well-developed.  HENT:     Head: Normocephalic and atraumatic.     Mouth/Throat:     Mouth: Mucous membranes are moist.     Pharynx: Oropharynx is clear.  Eyes:     Extraocular Movements: Extraocular movements intact.     Conjunctiva/sclera: Conjunctivae normal.     Pupils: Pupils are equal, round, and reactive to light.  Cardiovascular:     Rate and Rhythm: Normal rate and regular rhythm.     Pulses: Normal pulses.     Heart sounds: Normal heart sounds. No murmur heard. Pulmonary:     Effort: Pulmonary effort is normal. No respiratory distress.     Breath sounds: Normal breath sounds.  Abdominal:     Palpations: Abdomen is soft.     Tenderness: There is no abdominal tenderness. There is no guarding or rebound.  Musculoskeletal:        General: No swelling.      Cervical back: Neck supple.     Right lower leg: No edema.     Left lower leg: No edema.  Skin:    General: Skin is warm and dry.     Capillary Refill: Capillary refill takes less than 2 seconds.  Neurological:     General: No focal deficit present.     Mental Status: She is alert and oriented to person, place, and time. Mental status is at baseline.  Psychiatric:  Mood and Affect: Mood normal.     ED Results / Procedures / Treatments   Labs (all labs ordered are listed, but only abnormal results are displayed) Labs Reviewed  COMPREHENSIVE METABOLIC PANEL - Abnormal; Notable for the following components:      Result Value   Potassium 3.4 (*)    BUN 41 (*)    Creatinine, Ser 2.09 (*)    Calcium 8.8 (*)    GFR, Estimated 24 (*)    All other components within normal limits  CBC  TROPONIN I (HIGH SENSITIVITY)  TROPONIN I (HIGH SENSITIVITY)    EKG EKG Interpretation Date/Time:  Wednesday April 18 2023 13:07:47 EST Ventricular Rate:  62 PR Interval:    QRS Duration:  102 QT Interval:  358 QTC Calculation: 363 R Axis:   -26  Text Interpretation: Atrial fibrillation Minimal voltage criteria for LVH, may be normal variant ( Cornell product ) Nonspecific ST and T wave abnormality Abnormal ECG When compared with ECG of 18-Apr-2023 12:28, PREVIOUS ECG IS PRESENT Confirmed by Anders Simmonds [phone]) on 04/18/2023 1:34:53 PM  Radiology DG Chest 2 View  Result Date: 04/18/2023 CLINICAL DATA:  Chest pain. EXAM: CHEST - 2 VIEW COMPARISON:  04/10/2023. FINDINGS: Mild pulmonary vascular congestion. There are probable atelectatic changes at the left lung base. Bilateral lung fields are otherwise clear. No acute consolidation or lung collapse. Bilateral costophrenic angles are clear. No pneumothorax. Stable moderately enlarged cardio-mediastinal silhouette. Patient is status post CABG, left atrial appendage closure device and prosthetic valve placement. No acute osseous  abnormalities. There new, too, oval, opacities overlying the left lower lung zone and along the left lower lateral chest wall, likely artifactual due to external objects. The soft tissues are otherwise within normal limits. IMPRESSION: *No acute cardiopulmonary abnormality Electronically Signed   By: Jules Schick M.D.   On: 04/18/2023 16:40    Procedures Procedures  {Document cardiac monitor, telemetry assessment procedure when appropriate:1}  Medications Ordered in ED Medications - No data to display  ED Course/ Medical Decision Making/ A&P   {   Click here for ABCD2, HEART and other calculatorsREFRESH Note before signing :1}                              Medical Decision Making Amount and/or Complexity of Data Reviewed Labs: ordered. Radiology: ordered.   Medical Decision Making:   Diane Drye Zirkle is a 82 y.o. female who presented to the ED today with episode of chest pain.  Vital signs reviewed.  She is currently asymptomatic.  She had approximate 30 minutes of atypical chest pain.  It was substernal, nonradiating, nonexertional.  No improvement with nitro.  She has not had pain like this before.  Pain is completely resolved, mediastinal and chest x-ray, no radiation to her back I have low suspicion for dissection.  She is not short of breath, not hypoxic and no tachycardia lower suspicion for PE.  Her EKG shows atrial fibrillation which is rate controlled, just prominent Q waves in V2 and V3 which has been seen intermittently on prior EKGs.  Will evaluate with delta troponin.   {crccomplexity:27900} Reviewed and confirmed nursing documentation for past medical history, family history, social history.   Reassessment and Plan:   ***    Patient's presentation is most consistent with {EM COPA:27473}     {Document critical care time when appropriate:1} {Document review of labs and clinical decision tools ie heart score, Chads2Vasc2  etc:1}  {Document your independent  review of radiology images, and any outside records:1} {Document your discussion with family members, caretakers, and with consultants:1} {Document social determinants of health affecting pt's care:1} {Document your decision making why or why not admission, treatments were needed:1} Final Clinical Impression(s) / ED Diagnoses Final diagnoses:  None    Rx / DC Orders ED Discharge Orders     None

## 2023-04-18 NOTE — H&P (Signed)
Referring Physician: M. Sharyn Lull, MD/Diane Christiane Ha, MD  [place] Drye Loeffel is an 82 y.o. female.                       Chief Complaint: Chest pain  HPI: 82 years old black female with PMH of atrial fibrillation, prior mitral valve repair, prior tricuspid valve replacement presenting for chest pain. Patient states around 1115 she was at her dentist office. She was laying back when she developed severe pain in her chest. She felt like it went all over her body but did not specifically radiate. She tried nitro x 2 and that did not help. Last about 30 minutes and then resolved. It was better sitting up. Not worse with exertion. No nausea or vomiting or diaphoresis. No shortness of breath or pleuritic pain. She has not had pain like this before. She is currently asymptomatic and has been for the last few hours. She went to urgent care after trying a heart doctor. Urgent care sent her here. She was catheterized in December 2019, she had LAD with 30% proximal stenosis, diagonal 1 with 40 to 50% mid stenosis, patent left main, RCA with 40% proximal and 15 to 20% mid stenoses. She follows with Dr. Sharyn Lull.  Her troponin I levels were normal x 2. EKG showed atrial fibrillation with controlled ventricular response. Chest x-ray is without acute cardiopulmonary disease.  Past Medical History:  Diagnosis Date   Anxiety    Arthritis    back   Atrial fibrillation (HCC)    Breast cancer (HCC)    right breast cancer - 3 years ago   Chronic diastolic heart failure (HCC)    ECHO: 06/20/2017 - Grade 2 Diastolic Dysfunction    Coronary artery disease    Depression    Diabetes mellitus without complication (HCC)    diet   Dysrhythmia    afib   Fibromyalgia    Herniated disc    Hyperlipemia    Hypertension    Iron deficiency anemia    Longstanding persistent atrial fibrillation (HCC)    Lower back pain    Lumbar stenosis    L4-5   Personal history of chemotherapy    Personal history of radiation therapy     Pulmonary hypertension (HCC)    S/P Maze operation for atrial fibrillation 05/09/2018   Complete bilateral atrial lesion set using bipolar radiofrequency and cryothermy ablation with clipping of LA appendage   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   Docetaxel/Cytoxan Q 3 Weeks x 4 cycles   Torn rotator cuff    Tricuspid regurgitation       Past Surgical History:  Procedure Laterality Date   ABDOMINAL HYSTERECTOMY     BREAST BIOPSY Right 09/04/2012   BREAST LUMPECTOMY Right 10/08/2012   CLIPPING OF ATRIAL APPENDAGE N/A 05/09/2018   Procedure: CLIPPING OF ATRIAL APPENDAGE USING ATRICLIP PRO2 CLIP SIZE ;  Surgeon: Purcell Nails, MD;  Location: [facility] OR;  Service: Open Heart Surgery;  Laterality: N/A;  COLONOSCOPY     DILATION AND CURETTAGE OF UTERUS     LEFT HEART CATHETERIZATION WITH CORONARY ANGIOGRAM N/A 09/14/2014   Procedure: LEFT HEART CATHETERIZATION WITH CORONARY ANGIOGRAM;  Surgeon: Rinaldo Cloud, MD;  Location: [facility] CATH LAB;  Service: Cardiovascular;  Laterality: N/A;   MAZE N/A 05/09/2018   Procedure: MAZE;  Surgeon: Purcell Nails, MD;  Location: [facility] OR;  Service: Open Heart Surgery;  Laterality: N/A;   MITRAL VALVE REPAIR N/A 05/09/2018   Procedure: MITRAL VALVE REPAIR (MVR) USING MEMO 4D RING SIZE ;  Surgeon: Purcell Nails, MD;  Location[facility] OR;  Service: Open Heart Surgery;  Laterality: N/A;   OPEN REDUCTION INTERNAL FIXATION (ORIF) PROXIMAL PHALANX Right 01/04/2016   Procedure: OPEN TREATMENT OF RIGHT THUMB PROXIMAL PHALANX FRACTURE;  Surgeon: Mack Hook, MD;  Location: [facility];  Service: Orthopedics;  Laterality: Right;   PARTIAL MASTECTOMY WITH NEEDLE LOCALIZATION AND AXILLARY SENTINEL LYMPH NODE BX Right 10/08/2012   Procedure: RIGHT PARTIAL MASTECTOMY WITH NEEDLE LOCALIZATION AND RIGHT AXILLARY SENTINEL LYMPH NODE BX;  Surgeon: Ernestene Mention, MD;  Location: [facility] OR;  Service: General;  Laterality: Right;  Injection of 1% Methylene Blue dye into right breast   PORTACATH PLACEMENT Left 10/08/2012   Procedure: INSERTION PORT-A-CATH WITH ULTRASOUND GUIDANCE;  Surgeon: Ernestene Mention, MD;  Location: [facility] OR;  Service: General;  Laterality: Left;  Using 45F Powerport Clearvue   Posterior Lumbar Arthrodesis, Pedicle screw fixation, Posterolateral Arthodesis  03/17/11   RIGHT/LEFT HEART CATH AND CORONARY ANGIOGRAPHY N/A 05/02/2018   Procedure: RIGHT/LEFT HEART CATH AND CORONARY ANGIOGRAPHY;  Surgeon: Rinaldo Cloud, MD;  Location: [facility] INVASIVE CV LAB;  Service: Cardiovascular;  Laterality: N/A;   SHOULDER ARTHROSCOPY WITH ROTATOR CUFF REPAIR AND SUBACROMIAL DECOMPRESSION  06/04/2012   Procedure: SHOULDER ARTHROSCOPY WITH ROTATOR CUFF REPAIR AND SUBACROMIAL DECOMPRESSION;  Surgeon: Mable Paris, MD;  Location: [facility];  Service: Orthopedics;  Laterality: Left;  LEFT SHOULDER ARTHROSCOPY WITH ROTATOR CUFF REPAIR AND SUBACROMIAL DECOMPRESSION AND DISTAL CLAVICLE RESECTION   TEE WITHOUT CARDIOVERSION N/A 05/03/2018   Procedure: TRANSESOPHAGEAL ECHOCARDIOGRAM (TEE);  Surgeon: Orpah Cobb, MD;  Location: [facility] ENDOSCOPY;  Service: Cardiovascular;  Laterality: N/A;   TEE WITHOUT CARDIOVERSION N/A 05/09/2018   Procedure: TRANSESOPHAGEAL ECHOCARDIOGRAM (TEE);  Surgeon: Purcell Nails, MD;  Location: [facility] OR;  Service: Open Heart Surgery;  Laterality: N/A;   TONSILLECTOMY     TRICUSPID VALVE REPLACEMENT N/A 05/09/2018   Procedure: TRICUSPID VALVE REPAIR USING MC3 RING SIZE ;  Surgeon: Purcell Nails, MD;   Location: [facility] OR;  Service: Open Heart Surgery;  Laterality: N/A;   TUBAL LIGATION     VULVECTOMY Left 02/06/2019   Procedure: POSTERIOR RADICAL VULVECTOMY;  Surgeon: Adolphus Birchwood, MD;  Location: [facility] ORS;  Service: Gynecology;  Laterality: Left;   VULVECTOMY PARTIAL     Wide Local Excision  01/2010   Bilateral groin excisions, re-excision of vulva    Family History  Problem Relation Age of Onset   Stroke Mother    Alzheimer's disease Mother    Heart attack Father    Heart attack Brother    Breast cancer Neg Hx    Colon cancer Neg Hx    Ovarian cancer Neg Hx    Endometrial cancer Neg Hx    Pancreatic cancer Neg Hx    Prostate cancer Neg Hx    Social History:  reports that she quit smoking about 13 years ago. Her smoking use included cigarettes. She  started smoking about 63 years ago. She has a 50 pack-year smoking history. She has never used smokeless tobacco. She reports current alcohol use. She reports that she does not use drugs.  Allergies:  Allergies  Allergen Reactions   Shellfish Allergy Anaphylaxis and Swelling    Other reaction(s): Other (See Comments)   Atorvastatin Calcium     Other Reaction(s): muscle pain   Rosuvastatin Calcium Other (See Comments)    Other Reaction(s): myalgia   Shellfish-Derived Products Other (See Comments) and Swelling    (Not in a hospital admission)   Results for orders placed or performed during the hospital encounter of 04/18/23 (from the past 48 hour(s))  CBC     Status: None   Collection Time: 04/18/23  2:23 PM  Result Value Ref Range   WBC 10.4 4.0 - 10.5 K/uL   RBC 4.44 3.87 - 5.11 MIL/uL   Hemoglobin 12.0 12.0 - 15.0 g/dL   HCT 78.2 95.6 - 21.3 %   MCV 87.4 80.0 - 100.0 fL   MCH 27.0 26.0 - 34.0 pg   MCHC 30.9 30.0 - 36.0 g/dL   RDW 08.6 57.8 - 46.9 %   Platelets 172 150 - 400 K/uL   nRBC 0.0 0.0 - 0.2 %    Comment: Performed at [facility], [address] [place], [place] [address]  Troponin I (High Sensitivity)      Status: None   Collection Time: 04/18/23  2:23 PM  Result Value Ref Range   Troponin I (High Sensitivity) 11 <18 ng/L    Comment: (NOTE) Elevated high sensitivity troponin I (hsTnI) values and significant  changes across serial measurements may suggest ACS but many other  chronic and acute conditions are known to elevate hsTnI results.  Refer to the "Links" section for chest pain algorithms and additional  guidance. Performed at [facility], [address] [place], [place] [address]   Comprehensive metabolic panel     Status: Abnormal   Collection Time: 04/18/23  2:23 PM  Result Value Ref Range   Sodium 140 135 - 145 mmol/L   Potassium 3.4 (L) 3.5 - 5.1 mmol/L   Chloride 105 98 - 111 mmol/L   CO2 23 22 - 32 mmol/L   Glucose, Bld 86 70 - 99 mg/dL    Comment: Glucose reference range applies only to samples taken after fasting for at least 8 hours.   BUN 41 (H) 8 - 23 mg/dL   Creatinine, Ser 4.40 (H) 0.44 - 1.00 mg/dL   Calcium 8.8 (L) 8.9 - 10.3 mg/dL   Total Protein 7.1 6.5 - 8.1 g/dL   Albumin 3.5 3.5 - 5.0 g/dL   AST 21 15 - 41 U/L   ALT 26 0 - 44 U/L   Alkaline Phosphatase 58 38 - 126 U/L   Total Bilirubin 0.9 <1.2 mg/dL   GFR, Estimated 24 (L) >60 mL/min    Comment: (NOTE) Calculated using the CKD-EPI Creatinine Equation (2021)    Anion gap 12 5 - 15    Comment: Performed at [facility], [address], [place], [place] [address]  Troponin I (High Sensitivity)     Status: None   Collection Time: 04/18/23  5:31 PM  Result Value Ref Range   Troponin I (High Sensitivity) 12 <18 ng/L    Comment: (NOTE) Elevated high sensitivity troponin I (hsTnI) values and significant  changes across serial measurements may suggest ACS but many other  chronic and acute  conditions are known to elevate hsTnI results.  Refer to the "Links" section for chest pain algorithms and additional  guidance. Performed at [facility], [address] [place],  [place] [address]    DG Chest 2 View  Result Date: 04/18/2023 CLINICAL DATA:  Chest pain. EXAM: CHEST - 2 VIEW COMPARISON:  04/10/2023. FINDINGS: Mild pulmonary vascular congestion. There are probable atelectatic changes at the left lung base. Bilateral lung fields are otherwise clear. No acute consolidation or lung collapse. Bilateral costophrenic angles are clear. No pneumothorax. Stable moderately enlarged cardio-mediastinal silhouette. Patient is status post CABG, left atrial appendage closure device and prosthetic valve placement. No acute osseous abnormalities. There new, too, oval, opacities overlying the left lower lung zone and along the left lower lateral chest wall, likely artifactual due to external objects. The soft tissues are otherwise within normal limits. IMPRESSION: *No acute cardiopulmonary abnormality Electronically Signed   By: Jules Schick M.D.   On: 04/18/2023 16:40    Review Of Systems Constitutional: No fever, chills, weight loss or gain. Eyes: No vision change, wears glasses. No discharge or pain. Ears: No hearing loss, No tinnitus. Respiratory: Positive asthma, no COPD, pneumonias. No shortness of breath. No hemoptysis. Cardiovascular: Positive chest pain, palpitation, leg edema. Gastrointestinal: No nausea, vomiting, diarrhea, constipation. No GI bleed. No hepatitis. Genitourinary: No dysuria, hematuria, kidney stone. No incontinance. Neurological: No headache, stroke, seizures.  Psychiatry: No psych facility admission for anxiety, depression, suicide. No detox. Skin: No rash. Musculoskeletal: Positive joint pain, fibromyalgia. No neck pain, back pain. Lymphadenopathy: No lymphadenopathy. Hematology: No anemia or easy bruising.   Blood pressure (!) 133/56, pulse 63, temperature 97.9 F (36.6 C), temperature source Oral, resp. rate 16, height 5\' 7"  (1.702 m), weight 93.9 kg, SpO2 100%. Body mass index is 32.42 kg/m. General appearance: alert, cooperative, appears  stated age and no distress Head: Normocephalic, atraumatic. Eyes: Browneyes, pink conjunctiva, corneas clear.  Neck: No adenopathy, no carotid bruit, no JVD, supple, symmetrical, trachea midline and thyroid not enlarged. Resp: Clear to auscultation bilaterally. Cardio: Regular rate and rhythm, S1, S2 normal, III/VI systolic and II/VI diastolic murmur, no click, rub or gallop GI: Soft, non-tender; bowel sounds normal; no organomegaly. Extremities: No edema, cyanosis or clubbing. Skin: Warm and dry.  Neurologic: Alert and oriented X 3, normal strength. Normal coordination.  Assessment/Plan Acute coronary syndrome Paroxysmal atrial fibrillation CAD HTN HLD Obesity S/P breast cancer S/P stroke MV disorder, 26 mm ring TV disorder, 28 mm ring  Plan: Place in observation. Myocardial perfusion Nuclear stress test. Echocardiogram. Home medications.   Time spent: Review of old records, Lab, x-rays, EKG, other cardiac tests, examination, discussion with patient over 70 minutes.  Ricki Rodriguez, MD  04/18/2023, 8:35 PM

## 2023-04-18 NOTE — ED Notes (Signed)
Patient is being discharged from the Urgent Care and sent to the Emergency Department via [facility] . Per Dorann Ou, PA, patient is in need of higher level of care due to Chest Pain. Patient is aware and verbalizes understanding of plan of care.  Vitals:   04/18/23 1219  BP: (!) 161/80  Pulse: 63  Resp: 16  Temp: 97.6 F (36.4 C)  SpO2: 97%

## 2023-04-18 NOTE — ED Notes (Signed)
Called [facility] charge nurse to notify of patient transport via [facility]

## 2023-04-18 NOTE — ED Notes (Signed)
Pt ambulated to the bathroom.  

## 2023-04-18 NOTE — ED Notes (Signed)
ED TO INPATIENT HANDOFF REPORT  ED Nurse Name and Phone #: [phone]   S Name/Age/Gender Diane [name] 82 y.o. female Room/Bed: 033C/033C  Code Status   Code Status: Full Code  Home/SNF/Other Home Patient oriented to: self, place, time, and situation Is this baseline? Yes   Triage Complete: Triage complete  Chief Complaint Acute coronary syndrome [facility]) [I24.9]  Triage Note Mid chest pain started this AM while at the dentist's office. Took Aspirin 81mg  and 2 nitroglycerin SL with no relief. Hx of heart valve replacement.    Allergies Allergies  Allergen Reactions   Shellfish Allergy Anaphylaxis and Swelling    Other reaction(s): Other (See Comments)   Atorvastatin Calcium     Other Reaction(s): muscle pain   Rosuvastatin Calcium Other (See Comments)    Other Reaction(s): myalgia   Shellfish-Derived Products Other (See Comments) and Swelling    Level of Care/Admitting Diagnosis ED Disposition     ED Disposition  Admit   Condition  --   Comment  Hospital Area: [facility] [[id number]]  Level of Care: Telemetry Cardiac [[id number]]  May place patient in observation at [facility] or [facility] if equivalent level of care is available:: No  Covid Evaluation: Asymptomatic - no recent exposure (last 10 days) testing not required  Diagnosis: Acute coronary syndrome [facility]) [[id number]]  Admitting Physician: Orpah Cobb [[id number]]  Attending Physician: Orpah Cobb [[id number]]          B Medical/Surgery History Past Medical History:  Diagnosis Date   Anxiety    Arthritis    back   Atrial fibrillation (HCC)    Breast cancer (HCC)    right breast cancer - 3 years ago   Chronic diastolic heart failure (HCC)    ECHO: 06/20/2017 - Grade 2 Diastolic Dysfunction    Coronary artery disease    Depression    Diabetes mellitus without complication (HCC)    diet   Dysrhythmia    afib   Fibromyalgia    Herniated disc    Hyperlipemia    Hypertension    Iron  deficiency anemia    Longstanding persistent atrial fibrillation (HCC)    Lower back pain    Lumbar stenosis    L4-5   Personal history of chemotherapy    Personal history of radiation therapy    Pulmonary hypertension (HCC)    S/P Maze operation for atrial fibrillation 05/09/2018   Complete bilateral atrial lesion set using bipolar radiofrequency and cryothermy ablation with clipping of LA appendage   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   Docetaxel/Cytoxan Q 3 Weeks x 4 cycles   Torn rotator cuff    Tricuspid regurgitation    Past Surgical History:  Procedure Laterality Date   ABDOMINAL HYSTERECTOMY     BREAST BIOPSY Right 09/04/2012   BREAST LUMPECTOMY Right 10/08/2012   CLIPPING OF ATRIAL APPENDAGE N/A 05/09/2018   Procedure: CLIPPING OF ATRIAL APPENDAGE USING ATRICLIP PRO2 CLIP SIZE ;  Surgeon: Purcell Nails, MD;  Location:  [facility] OR;  Service: Open Heart Surgery;  Laterality: N/A;   COLONOSCOPY     DILATION AND CURETTAGE OF UTERUS     LEFT HEART CATHETERIZATION WITH CORONARY ANGIOGRAM N/A 09/14/2014   Procedure: LEFT HEART CATHETERIZATION WITH CORONARY ANGIOGRAM;  Surgeon: Rinaldo Cloud, MD;  Location: [facility] CATH LAB;  Service: Cardiovascular;  Laterality: N/A;   MAZE N/A 05/09/2018   Procedure: MAZE;  Surgeon: Purcell Nails, MD;  Location: [facility] OR;  Service: Open Heart Surgery;  Laterality: N/A;   MITRAL VALVE REPAIR N/A 05/09/2018   Procedure: MITRAL VALVE REPAIR (MVR) USING MEMO 4D RING SIZE ;  Surgeon: Purcell Nails, MD;  Location: [facility] OR;  Service: Open  Heart Surgery;  Laterality: N/A;   OPEN REDUCTION INTERNAL FIXATION (ORIF) PROXIMAL PHALANX Right 01/04/2016   Procedure: OPEN TREATMENT OF RIGHT THUMB PROXIMAL PHALANX FRACTURE;  Surgeon: Mack Hook, MD;  Location: [facility];  Service: Orthopedics;  Laterality: Right;   PARTIAL MASTECTOMY WITH NEEDLE LOCALIZATION AND AXILLARY SENTINEL LYMPH NODE BX Right 10/08/2012   Procedure: RIGHT PARTIAL MASTECTOMY WITH NEEDLE LOCALIZATION AND RIGHT AXILLARY SENTINEL LYMPH NODE BX;  Surgeon: Ernestene Mention, MD;  Location: [facility] OR;  Service: General;  Laterality: Right;  Injection of 1% Methylene Blue dye into right breast   PORTACATH PLACEMENT Left 10/08/2012   Procedure: INSERTION PORT-A-CATH WITH ULTRASOUND GUIDANCE;  Surgeon: Ernestene Mention, MD;  Location: [facility] OR;  Service: General;  Laterality: Left;  Using 82F Powerport Clearvue   Posterior Lumbar Arthrodesis, Pedicle screw fixation, Posterolateral Arthodesis  03/17/11   RIGHT/LEFT HEART CATH AND CORONARY ANGIOGRAPHY N/A 05/02/2018   Procedure: RIGHT/LEFT HEART CATH AND CORONARY ANGIOGRAPHY;  Surgeon: Rinaldo Cloud, MD;  Location: [facility] INVASIVE CV LAB;  Service: Cardiovascular;  Laterality: N/A;   SHOULDER ARTHROSCOPY WITH ROTATOR CUFF REPAIR AND SUBACROMIAL DECOMPRESSION  06/04/2012   Procedure: SHOULDER ARTHROSCOPY WITH ROTATOR CUFF REPAIR AND SUBACROMIAL DECOMPRESSION;  Surgeon: Mable Paris, MD;  Location: [facility];  Service: Orthopedics;  Laterality: Left;  LEFT SHOULDER ARTHROSCOPY WITH ROTATOR CUFF REPAIR AND SUBACROMIAL DECOMPRESSION AND DISTAL CLAVICLE RESECTION   TEE WITHOUT CARDIOVERSION N/A 05/03/2018   Procedure: TRANSESOPHAGEAL ECHOCARDIOGRAM (TEE);  Surgeon: Orpah Cobb, MD;  Location: [facility] ENDOSCOPY;  Service: Cardiovascular;  Laterality: N/A;   TEE WITHOUT CARDIOVERSION N/A 05/09/2018   Procedure: TRANSESOPHAGEAL ECHOCARDIOGRAM (TEE);  Surgeon: Purcell Nails, MD;  Location: [facility] OR;  Service: Open  Heart Surgery;  Laterality: N/A;   TONSILLECTOMY     TRICUSPID VALVE REPLACEMENT N/A 05/09/2018   Procedure: TRICUSPID VALVE REPAIR USING MC3 RING SIZE ;  Surgeon: Purcell Nails, MD;  Location: [facility] OR;  Service: Open Heart Surgery;  Laterality: N/A;   TUBAL LIGATION     VULVECTOMY Left 02/06/2019   Procedure: POSTERIOR RADICAL VULVECTOMY;  Surgeon: Adolphus Birchwood, MD;  Location: [facility] ORS;  Service: Gynecology;  Laterality: Left;   VULVECTOMY PARTIAL     Wide Local Excision  01/2010   Bilateral groin excisions, re-excision of vulva     A IV Location/Drains/Wounds Patient Lines/Drains/Airways Status     Active Line/Drains/Airways     Name Placement date Placement time Site Days   Peripheral IV 04/18/23 22 G 0.75" Posterior;Right Hand 04/18/23  1735  Hand  less than 1            Intake/Output Last 24 hours No intake or output data in the 24 hours ending 04/18/23 2044  Labs/Imaging Results for orders placed or performed during the hospital  encounter of 04/18/23 (from the past 48 hour(s))  CBC     Status: None   Collection Time: 04/18/23  2:23 PM  Result Value Ref Range   WBC 10.4 4.0 - 10.5 K/uL   RBC 4.44 3.87 - 5.11 MIL/uL   Hemoglobin 12.0 12.0 - 15.0 g/dL   HCT 16.1 09.6 - 04.5 %   MCV 87.4 80.0 - 100.0 fL   MCH 27.0 26.0 - 34.0 pg   MCHC 30.9 30.0 - 36.0 g/dL   RDW 40.9 81.1 - 91.4 %   Platelets 172 150 - 400 K/uL   nRBC 0.0 0.0 - 0.2 %    Comment: Performed at [facility], [address] [place], [place] [address]  Troponin I (High Sensitivity)     Status: None   Collection Time: 04/18/23  2:23 PM  Result Value Ref Range   Troponin I (High Sensitivity) 11 <18 ng/L    Comment: (NOTE) Elevated high sensitivity troponin I (hsTnI) values and significant  changes across serial measurements may suggest ACS but many other  chronic and acute conditions are known to elevate hsTnI results.  Refer to the "Links" section for chest pain algorithms and additional   guidance. Performed at [facility], [address], [place], [place] [address]   Comprehensive metabolic panel     Status: Abnormal   Collection Time: 04/18/23  2:23 PM  Result Value Ref Range   Sodium 140 135 - 145 mmol/L   Potassium 3.4 (L) 3.5 - 5.1 mmol/L   Chloride 105 98 - 111 mmol/L   CO2 23 22 - 32 mmol/L   Glucose, Bld 86 70 - 99 mg/dL    Comment: Glucose reference range applies only to samples taken after fasting for at least 8 hours.   BUN 41 (H) 8 - 23 mg/dL   Creatinine, Ser 8.65 (H) 0.44 - 1.00 mg/dL   Calcium 8.8 (L) 8.9 - 10.3 mg/dL   Total Protein 7.1 6.5 - 8.1 g/dL   Albumin 3.5 3.5 - 5.0 g/dL   AST 21 15 - 41 U/L   ALT 26 0 - 44 U/L   Alkaline Phosphatase 58 38 - 126 U/L   Total Bilirubin 0.9 <1.2 mg/dL   GFR, Estimated 24 (L) >60 mL/min    Comment: (NOTE) Calculated using the CKD-EPI Creatinine Equation (2021)    Anion gap 12 5 - 15    Comment: Performed at [facility], [address], [place], [place] [address]  Troponin I (High Sensitivity)     Status: None   Collection Time: 04/18/23  5:31 PM  Result Value Ref Range   Troponin I (High Sensitivity) 12 <18 ng/L    Comment: (NOTE) Elevated high sensitivity troponin I (hsTnI) values and significant  changes across serial measurements may suggest ACS but many other  chronic and acute conditions are known to elevate hsTnI results.  Refer to the "Links" section for chest pain algorithms and additional  guidance. Performed at [facility], [address], [place], [place] [address]    DG Chest 2 View  Result Date: 04/18/2023 CLINICAL DATA:  Chest pain. EXAM: CHEST - 2 VIEW COMPARISON:  04/10/2023. FINDINGS: Mild pulmonary vascular congestion. There are probable atelectatic changes at the left lung base. Bilateral lung fields are otherwise clear. No acute consolidation or lung collapse. Bilateral costophrenic angles are clear. No pneumothorax. Stable moderately enlarged  cardio-mediastinal silhouette. Patient is status post CABG, left atrial appendage closure  device and prosthetic valve placement. No acute osseous abnormalities. There new, too, oval, opacities overlying the left lower lung zone and along the left lower lateral chest wall, likely artifactual due to external objects. The soft tissues are otherwise within normal limits. IMPRESSION: *No acute cardiopulmonary abnormality Electronically Signed   By: Jules Schick M.D.   On: 04/18/2023 16:40    Pending Labs Unresulted Labs (From admission, onward)     Start     Ordered   04/21/23 0500  Heparin level (unfractionated)  Daily,   R      04/18/23 2041   04/19/23 0500  Lipoprotein A (LPA)  Tomorrow morning,   R        04/18/23 2030   04/19/23 0500  Basic metabolic panel  Tomorrow morning,   R        04/18/23 2030   04/19/23 0500  Lipid panel  Tomorrow morning,   R        04/18/23 2030   04/19/23 0500  CBC  Tomorrow morning,   R        04/18/23 2030   04/19/23 0500  Heparin level (unfractionated)  Once-Timed,   TIMED        04/18/23 2041            Vitals/Pain Today's Vitals   04/18/23 1319 04/18/23 1652 04/18/23 1700 04/18/23 1715  BP:  (!) 129/54 (!) 121/50 (!) 133/56  Pulse:  64 65 63  Resp:  15 15 16   Temp:  97.9 F (36.6 C)    TempSrc:  Oral    SpO2:  100% 100% 100%  Weight: 93.9 kg     Height: 5\' 7"  (1.702 m)     PainSc: 0-No pain       Isolation Precautions No active isolations  Medications Medications  aspirin chewable tablet 324 mg (has no administration in time range)    Or  aspirin suppository 300 mg (has no administration in time range)  aspirin EC tablet 81 mg (has no administration in time range)  nitroGLYCERIN (NITROSTAT) SL tablet 0.4 mg (has no administration in time range)  acetaminophen (TYLENOL) tablet 650 mg (has no administration in time range)  ondansetron (ZOFRAN) injection 4 mg (has no administration in time range)  sodium chloride flush (NS) 0.9 %  injection 3 mL (has no administration in time range)  sodium chloride flush (NS) 0.9 % injection 3 mL (has no administration in time range)  0.9 %  sodium chloride infusion (has no administration in time range)  albuterol (VENTOLIN HFA) 108 (90 Base) MCG/ACT inhaler 2 puff (has no administration in time range)  amiodarone (PACERONE) tablet 100 mg (has no administration in time range)  co-enzyme Q-10 capsule 30 mg (has no administration in time range)  fluticasone furoate-vilanterol (BREO ELLIPTA) 100-25 MCG/ACT 1 puff (has no administration in time range)    And  umeclidinium bromide (INCRUSE ELLIPTA) 62.5 MCG/ACT 1 puff (has no administration in time range)  Centrum Silver 50+Women TABS 1 tablet (has no administration in time range)  pantoprazole (PROTONIX) EC tablet 40 mg (has no administration in time range)  pravastatin (PRAVACHOL) tablet 10 mg (has no administration in time range)  heparin bolus via infusion 4,000 Units (has no administration in time range)  heparin ADULT infusion 100 units/mL (25000 units/267mL) (has no administration in time range)    Mobility walks     Focused Assessments Cardiac Assessment Handoff:  Cardiac Rhythm: Normal sinus rhythm Lab Results  Component Value Date  TROPONINI <0.03 07/05/2017   Lab Results  Component Value Date   DDIMER <0.27 09/10/2014   Does the Patient currently have chest pain? No    R Recommendations: See Admitting Provider Note  Report given to:   Additional Notes:

## 2023-04-18 NOTE — ED Triage Notes (Addendum)
Pt went to the dentist this morning and started having chest pain and SOB while there. She has already taken 2 nitroglycerins

## 2023-04-18 NOTE — ED Provider Notes (Signed)
[facility]    CSN: [id number] Arrival date & time: 04/18/23  1213      History   Chief Complaint Chief Complaint  Patient presents with   Chest Pain    HPI [place] Drye Mcgrogan is a 82 y.o. female.   Patient presents today with a several hour history of central chest pain.  She reports that she was at the dentist getting a crown when she suddenly had severe centralized chest pain.  This then spread to her back.  She has taken 2 nitroglycerin which has provided improvement not resolution of symptoms.  She does report some associated shortness of breath but states this is chronic and related to ongoing pulmonary disease.  She reports the pain has improved and is minimal now but she was concerned because of the severity of her symptoms.  She does have a history of chronic atrial fibrillation as well as CAD.  She initially had some diaphoresis but this is improved.  Denies any spread of pain to her shoulder/jaw or associated nausea/vomiting.    Past Medical History:  Diagnosis Date   Anxiety    Arthritis    back   Atrial fibrillation (HCC)    Breast cancer (HCC)    right breast cancer - 3 years ago   Chronic diastolic heart failure (HCC)    ECHO: 06/20/2017 - Grade 2 Diastolic Dysfunction    Coronary artery disease    Depression    Diabetes mellitus without complication (HCC)    diet   Dysrhythmia    afib   Fibromyalgia    Herniated disc    Hyperlipemia    Hypertension    Iron deficiency anemia    Longstanding persistent atrial fibrillation (HCC)    Lower back pain    Lumbar stenosis    L4-5   Personal history of chemotherapy    Personal history of radiation therapy    Pulmonary hypertension (HCC)    S/P Maze operation for atrial fibrillation 05/09/2018   Complete bilateral atrial lesion set using bipolar radiofrequency and cryothermy ablation with clipping of LA appendage   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   Swelling of limb 01/23/2013   Oropharyngeal candidiasis 01/13/2013   Chemotherapy induced neutropenia (HCC) 01/13/2013   UTI (urinary tract infection) 12/25/2012   Cancer of central portion of right female breast (HCC) 09/06/2012   Vulva cancer (HCC) 08/16/2011   INSOMNIA 08/26/2010   Headache 08/26/2010   TOBACCO USER 11/04/2009   Asymptomatic postmenopausal status 11/04/2009   DYSLIPIDEMIA 07/30/2009   Alopecia 07/30/2009   ANEMIA  07/26/2009   SINUSITIS, ACUTE 04/02/2009   DIABETES MELLITUS, TYPE II 01/01/2009   URINARY TRACT INFECTION SITE NOT SPECIFIED 11/26/2008   BREAST PAIN, RIGHT 11/12/2008   Angioedema 10/05/2008   CHEST PAIN, ATYPICAL 09/01/2008   Allergic rhinitis 05/05/2008   Vaginitis and vulvovaginitis 05/05/2008   CAROTID BRUIT, RIGHT 11/12/2007   SHOULDER PAIN, LEFT, CHRONIC 08/09/2007   DEGENERATIVE DISC DISEASE, 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   DEGENERATIVE DISC DISEASE, LUMBOSACRAL SPINE 08/09/2007   HYPERTENSION, BENIGN ESSENTIAL 03/20/2007   BRONCHITIS, ACUTE 03/20/2007   DEPRESSION 02/27/2007   MURMUR 02/27/2007    Past Surgical History:  Procedure Laterality Date   ABDOMINAL HYSTERECTOMY     BREAST BIOPSY Right 09/04/2012   BREAST LUMPECTOMY Right 10/08/2012   CLIPPING OF ATRIAL APPENDAGE N/A 05/09/2018   Procedure: CLIPPING OF ATRIAL APPENDAGE USING ATRICLIP PRO2 CLIP SIZE ;  Surgeon: Purcell Nails, MD;  Location: [facility] OR;  Service: Open Heart Surgery;  Laterality: N/A;   COLONOSCOPY     DILATION AND CURETTAGE OF UTERUS     LEFT HEART CATHETERIZATION WITH CORONARY ANGIOGRAM N/A 09/14/2014   Procedure: LEFT HEART CATHETERIZATION WITH CORONARY ANGIOGRAM;  Surgeon: Rinaldo Cloud, MD;  Location: [facility] CATH LAB;  Service: Cardiovascular;  Laterality: N/A;   MAZE N/A 05/09/2018   Procedure: MAZE;  Surgeon: Purcell Nails, MD;  Location: [facility] OR;  Service: Open Heart Surgery;  Laterality: N/A;   MITRAL VALVE REPAIR N/A 05/09/2018   Procedure: MITRAL VALVE REPAIR (MVR) USING MEMO 4D RING SIZE ;  Surgeon: Purcell Nails, MD;  Location: [facility] OR;  Service: Open Heart Surgery;  Laterality: N/A;   OPEN REDUCTION INTERNAL FIXATION (ORIF) PROXIMAL PHALANX Right 01/04/2016   Procedure: OPEN TREATMENT OF RIGHT THUMB PROXIMAL PHALANX FRACTURE;  Surgeon: Mack Hook, MD;  Location: [facility];  Service: Orthopedics;  Laterality: Right;   PARTIAL MASTECTOMY WITH NEEDLE LOCALIZATION AND  AXILLARY SENTINEL LYMPH NODE BX Right 10/08/2012   Procedure: RIGHT PARTIAL MASTECTOMY WITH NEEDLE LOCALIZATION AND RIGHT AXILLARY SENTINEL LYMPH NODE BX;  Surgeon: Ernestene Mention, MD;  Location: [facility] OR;  Service: General;  Laterality: Right;  Injection of 1% Methylene Blue dye into right breast   PORTACATH PLACEMENT Left 10/08/2012   Procedure: INSERTION PORT-A-CATH WITH ULTRASOUND GUIDANCE;  Surgeon: Ernestene Mention, MD;  Location: [facility] OR;  Service: General;  Laterality: Left;  Using 63F Powerport Clearvue   Posterior Lumbar Arthrodesis, Pedicle screw fixation, Posterolateral Arthodesis  03/17/11   RIGHT/LEFT HEART CATH AND CORONARY ANGIOGRAPHY N/A 05/02/2018   Procedure: RIGHT/LEFT HEART CATH AND CORONARY ANGIOGRAPHY;  Surgeon: Rinaldo Cloud, MD;  Location: [facility];  Service: Cardiovascular;  Laterality: N/A;   SHOULDER ARTHROSCOPY WITH ROTATOR CUFF REPAIR AND SUBACROMIAL DECOMPRESSION  06/04/2012   Procedure: SHOULDER ARTHROSCOPY WITH ROTATOR CUFF REPAIR AND SUBACROMIAL DECOMPRESSION;  Surgeon: Jill Alexanders  Jerrilyn Cairo, MD;  Location: [facility];  Service: Orthopedics;  Laterality: Left;  LEFT SHOULDER ARTHROSCOPY WITH ROTATOR CUFF REPAIR AND SUBACROMIAL DECOMPRESSION AND DISTAL CLAVICLE RESECTION   TEE WITHOUT CARDIOVERSION N/A 05/03/2018   Procedure: TRANSESOPHAGEAL ECHOCARDIOGRAM (TEE);  Surgeon: Orpah Cobb, MD;  Location: [facility] ENDOSCOPY;  Service: Cardiovascular;  Laterality: N/A;   TEE WITHOUT CARDIOVERSION N/A 05/09/2018   Procedure: TRANSESOPHAGEAL ECHOCARDIOGRAM (TEE);  Surgeon: Purcell Nails, MD;  Location: [facility] OR;  Service: Open Heart Surgery;  Laterality: N/A;   TONSILLECTOMY     TRICUSPID VALVE REPLACEMENT N/A 05/09/2018   Procedure: TRICUSPID VALVE REPAIR USING MC3 RING SIZE ;  Surgeon: Purcell Nails, MD;  Location: [facility] OR;  Service: Open Heart Surgery;  Laterality: N/A;   TUBAL LIGATION     VULVECTOMY Left 02/06/2019   Procedure: POSTERIOR RADICAL  VULVECTOMY;  Surgeon: Adolphus Birchwood, MD;  Location: [facility] ORS;  Service: Gynecology;  Laterality: Left;   VULVECTOMY PARTIAL     Wide Local Excision  01/2010   Bilateral groin excisions, re-excision of vulva    OB History   No obstetric history on file.      Home Medications    Prior to Admission medications   Medication Sig Start Date End Date Taking? Authorizing Provider  acetaminophen (TYLENOL) 500 MG tablet Take 500 mg by mouth every 6 (six) hours as needed (for pain.).    [provider]  albuterol (VENTOLIN HFA) 108 (90 Base) MCG/ACT inhaler Inhale 2 puffs into the lungs every 6 (six) hours as needed for wheezing or shortness of breath. 04/10/23   Parrett, Virgel Bouquet, NP  amiodarone (PACERONE) 200 MG tablet Take 100 mg by mouth every other day. 07/06/20   [provider]  amoxicillin (AMOXIL) 500 MG capsule Take 2,000 mg by mouth as needed (dental appointment). 11/05/19   [provider]  aspirin EC 81 MG tablet Take 81 mg by mouth daily.    [provider]  busPIRone (BUSPAR) 5 MG tablet Take 5 mg by mouth 2 (two) times daily. 07/07/21   [provider]  co-enzyme Q-10 30 MG capsule Take 30 mg by mouth daily.    [provider]  colchicine 0.6 MG tablet Take 1 tablet (0.6 mg total) by mouth daily. 08/16/22   Candelaria Stagers, DPM  ELIQUIS 2.5 MG TABS tablet Take 2.5 mg by mouth 2 (two) times daily. 02/21/19   [provider]  ferrous sulfate 325 (65 FE) MG EC tablet Take 325 mg by mouth once a week. Thursday's 11/05/19   [provider]  Fluticasone-Umeclidin-Vilant (TRELEGY ELLIPTA) 100-62.5-25 MCG/ACT AEPB Inhale 1 puff into the lungs daily. 04/10/23   Parrett, Virgel Bouquet, NP  furosemide (LASIX) 40 MG tablet Take 1 tablet (40 mg total) by mouth daily. 05/20/18   Gold, Wayne E, PA-C  gabapentin (NEURONTIN) 300 MG capsule Take 300 mg by mouth at bedtime.  01/03/19   [provider]  ketoconazole (NIZORAL) 2 % cream Apply  topically daily. 06/03/21   [provider]  Lancets (ONETOUCH DELICA PLUS LANCET33G) MISC USE AS DIRECTED DAILY FOR 30 DAYS 12/09/18   [provider]  loratadine (CLARITIN) 10 MG tablet Take 10 mg by mouth daily as needed for allergies.    [provider]  meloxicam (MOBIC) 15 MG tablet Take 1 tablet (15 mg total) by mouth daily. 08/16/22   Candelaria Stagers, DPM  Multiple Vitamins-Minerals (CENTRUM SILVER 50+WOMEN) TABS Take 1 tablet by mouth daily.  [provider]  nitroGLYCERIN (NITROSTAT) 0.4 MG SL tablet Place 0.4 mg under the tongue every 5 (five) minutes x 3 doses as needed for chest pain. 12/10/18   [provider]  ONETOUCH VERIO test strip FOR USE WHEN CHECKING BLOOD GLUCOSE ONCE DAILY ALTERNATING AM AND PM BEFORE MEALS FINGER STICK 90 DAYS 01/20/18   [provider]  pantoprazole (PROTONIX) 40 MG tablet Take 1 tablet by mouth once daily 10/30/22   Omar Person, MD  pravastatin (PRAVACHOL) 10 MG tablet Take 10 mg by mouth at bedtime. 10/19/19   [provider]  predniSONE (DELTASONE) 10 MG tablet 4 tabs for 2 days, then 3 tabs for 2 days, 2 tabs for 2 days, then 1 tab for 2 days, then stop 04/10/23   Parrett, Tammy S, NP  spironolactone (ALDACTONE) 25 MG tablet Take 12.5 mg by mouth every morning. 11/25/20   [provider]    Family History Family History  Problem Relation Age of Onset   Stroke Mother    Alzheimer's disease Mother    Heart attack Father    Heart attack Brother    Breast cancer Neg Hx    Colon cancer Neg Hx    Ovarian cancer Neg Hx    Endometrial cancer Neg Hx    Pancreatic cancer Neg Hx    Prostate cancer Neg Hx     Social History Social History   Tobacco Use   Smoking status: Former    Current packs/day: 0.00    Average packs/day: 1 pack/day for 50.0 years (50.0 ttl pk-yrs)    Types: Cigarettes    Start date: 05/30/1959    Quit date: 05/29/2009    Years since quitting: 13.8    Smokeless tobacco: Never  Vaping Use   Vaping status: Never Used  Substance Use Topics   Alcohol use: Yes    Comment: rare   Drug use: No     Allergies   Shellfish allergy, Atorvastatin calcium, Rosuvastatin calcium, and Shellfish-derived products   Review of Systems Review of Systems  Constitutional:  Positive for activity change and diaphoresis.  Respiratory:  Positive for shortness of breath.   Cardiovascular:  Positive for chest pain.  Gastrointestinal:  Negative for abdominal pain, diarrhea, nausea and vomiting.     Physical Exam Triage Vital Signs ED Triage Vitals  Encounter Vitals Group     BP 04/18/23 1219 (!) 161/80     Systolic BP Percentile --      Diastolic BP Percentile --      Pulse Rate 04/18/23 1219 63     Resp 04/18/23 1219 16     Temp 04/18/23 1219 97.6 F (36.4 C)     Temp Source 04/18/23 1219 Oral     SpO2 04/18/23 1219 97 %     Weight --      Height --      Head Circumference --      Peak Flow --      Pain Score 04/18/23 1232 0     Pain Loc --      Pain Education --      Exclude from Growth Chart --    No data found.  Updated Vital Signs BP (!) 156/59 (BP Location: Left Arm) Comment (BP Location): large cuff  Pulse 68   Temp 97.6 F (36.4 C) (Oral)   Resp 16   SpO2 98%   Visual Acuity Right Eye Distance:   Left Eye Distance:   Bilateral Distance:  Right Eye Near:   Left Eye Near:    Bilateral Near:     Physical Exam Vitals reviewed.  Constitutional:      General: She is awake. She is not in acute distress.    Appearance: Normal appearance. She is well-developed. She is not ill-appearing.     Comments: Very pleasant female appears stated age in no acute distress sitting in triage room  HENT:     Head: Normocephalic and atraumatic.  Cardiovascular:     Rate and Rhythm: Normal rate. Rhythm irregularly irregular.     Heart sounds: S1 normal and S2 normal. Murmur heard.  Pulmonary:     Effort: Pulmonary effort is normal.      Breath sounds: Normal breath sounds. No wheezing, rhonchi or rales.     Comments: Clear to auscultation bilaterally Psychiatric:        Behavior: Behavior is cooperative.      UC Treatments / Results  Labs (all labs ordered are listed, but only abnormal results are displayed) Labs Reviewed - No data to display  EKG   Radiology No results found.  Procedures Procedures (including critical care time)  Medications Ordered in UC Medications - No data to display  Initial Impression / Assessment and Plan / UC Course  I have reviewed the triage vital signs and the nursing notes.  Pertinent labs & imaging results that were available during my care of the patient were reviewed by me and considered in my medical decision making (see chart for details).     Patient is nontoxic, nontachycardic, in no acute distress.  EKG was obtained that showed atrial fibrillation with incomplete right bundle branch block which is new compared to her September 2022 tracing without acute ischemia; she does have some elevation in aVR but no reciprocal changes.  Blood pressure was obtained in both arms and was symmetric.  Given she is already taken nitroglycerin and continues to have some discomfort as well as her significant cardiovascular history I recommend she go to the emergency room.  Capable of taking herself to the emergency room and did not have anyone to accompany our so [facility] was contacted for transport.  She was stable at the time of discharge.  Final Clinical Impressions(s) / UC Diagnoses   Final diagnoses:  Chest pain, unspecified type   Discharge Instructions   None    ED Prescriptions   None    PDMP not reviewed this encounter.   Jeani Hawking, PA-C 04/18/23 1304

## 2023-04-19 ENCOUNTER — Observation Stay (HOSPITAL_COMMUNITY): Payer: Medicare HMO

## 2023-04-19 ENCOUNTER — Encounter (HOSPITAL_COMMUNITY): Payer: Self-pay | Admitting: Cardiovascular Disease

## 2023-04-19 DIAGNOSIS — R079 Chest pain, unspecified: Secondary | ICD-10-CM | POA: Diagnosis not present

## 2023-04-19 DIAGNOSIS — I25110 Atherosclerotic heart disease of native coronary artery with unstable angina pectoris: Secondary | ICD-10-CM | POA: Diagnosis not present

## 2023-04-19 DIAGNOSIS — I350 Nonrheumatic aortic (valve) stenosis: Secondary | ICD-10-CM | POA: Diagnosis not present

## 2023-04-19 DIAGNOSIS — I4821 Permanent atrial fibrillation: Secondary | ICD-10-CM | POA: Diagnosis not present

## 2023-04-19 DIAGNOSIS — Z952 Presence of prosthetic heart valve: Secondary | ICD-10-CM | POA: Diagnosis not present

## 2023-04-19 LAB — BASIC METABOLIC PANEL
Anion gap: 7 (ref 5–15)
BUN: 35 mg/dL — ABNORMAL HIGH (ref 8–23)
CO2: 25 mmol/L (ref 22–32)
Calcium: 8.1 mg/dL — ABNORMAL LOW (ref 8.9–10.3)
Chloride: 108 mmol/L (ref 98–111)
Creatinine, Ser: 1.9 mg/dL — ABNORMAL HIGH (ref 0.44–1.00)
GFR, Estimated: 27 mL/min — ABNORMAL LOW (ref >60)
Glucose, Bld: 95 mg/dL (ref 70–99)
Potassium: 4 mmol/L (ref 3.5–5.1)
Sodium: 140 mmol/L (ref 135–145)

## 2023-04-19 LAB — CBC
HCT: 35.4 % — ABNORMAL LOW (ref 36.0–46.0)
Hemoglobin: 11.2 g/dL — ABNORMAL LOW (ref 12.0–15.0)
MCH: 27.3 pg (ref 26.0–34.0)
MCHC: 31.6 g/dL (ref 30.0–36.0)
MCV: 86.3 fL (ref 80.0–100.0)
Platelets: 163 10*3/uL (ref 150–400)
RBC: 4.1 MIL/uL (ref 3.87–5.11)
RDW: 15.4 % (ref 11.5–15.5)
WBC: 7.7 10*3/uL (ref 4.0–10.5)
nRBC: 0 % (ref 0.0–0.2)

## 2023-04-19 LAB — ECHOCARDIOGRAM COMPLETE
AR max vel: 1.41 cm2
AV Area VTI: 1.31 cm2
AV Area mean vel: 1.3 cm2
AV Mean grad: 9.3 mm[Hg]
AV Peak grad: 18 mm[Hg]
Ao pk vel: 2.12 m/s
Height: 67 in
MV VTI: 0.82 cm2
P 1/2 time: 635 ms
S' Lateral: 3.8 cm
Weight: 3312.19 [oz_av]

## 2023-04-19 LAB — HEPARIN LEVEL (UNFRACTIONATED): Heparin Unfractionated: >1.1 [IU]/mL — ABNORMAL HIGH (ref 0.30–0.70)

## 2023-04-19 LAB — LIPID PANEL
Cholesterol: 108 mg/dL (ref 0–200)
HDL: 50 mg/dL (ref >40)
LDL Cholesterol: 52 mg/dL (ref 0–99)
Total CHOL/HDL Ratio: 2.2 {ratio}
Triglycerides: 29 mg/dL (ref <150)
VLDL: 6 mg/dL (ref 0–40)

## 2023-04-19 LAB — APTT
aPTT: 119 s — ABNORMAL HIGH (ref 24–36)
aPTT: 106 s — ABNORMAL HIGH (ref 24–36)

## 2023-04-19 MED ORDER — REGADENOSON 0.4 MG/5ML IV SOLN
INTRAVENOUS | Status: AC
  Filled 2023-04-19: qty 5

## 2023-04-19 MED ORDER — APIXABAN 2.5 MG PO TABS: 2.5000 mg | ORAL_TABLET | Freq: Two times a day (BID) | ORAL | Status: DC

## 2023-04-19 MED ORDER — TECHNETIUM TC 99M TETROFOSMIN IV KIT
31.6000 | PACK | Freq: Once | INTRAVENOUS | Status: AC | PRN
  Administered 2023-04-19: 31.6 via INTRAVENOUS

## 2023-04-19 MED ORDER — POTASSIUM CHLORIDE CRYS ER 10 MEQ PO TBCR: 10.0000 meq | EXTENDED_RELEASE_TABLET | Freq: Every day | ORAL | 2 refills | Status: DC

## 2023-04-19 MED ORDER — REGADENOSON 0.4 MG/5ML IV SOLN
0.4000 mg | Freq: Once | INTRAVENOUS | Status: AC
  Administered 2023-04-19: 0.4 mg via INTRAVENOUS
  Filled 2023-04-19: qty 5

## 2023-04-19 MED ORDER — POTASSIUM CHLORIDE CRYS ER 10 MEQ PO TBCR: 10.0000 meq | EXTENDED_RELEASE_TABLET | Freq: Every day | ORAL | Status: DC

## 2023-04-19 MED ORDER — TECHNETIUM TC 99M TETROFOSMIN IV KIT
10.5000 | PACK | Freq: Once | INTRAVENOUS | Status: AC | PRN
  Administered 2023-04-19: 10.5 via INTRAVENOUS

## 2023-04-19 NOTE — Care Management (Addendum)
  Transition of Care [facility]) Screening Note   Patient Details  Name: Diane [name] Date of Birth: 06-03-1940   Transition of Care [facility]) CM/SW Contact:    Lockie Pares, RN Phone Number: 04/19/2023, 3:16 PM    Transition of Care Department [facility]) has reviewed patient and no TOC needs have been identified at this time. We will continue to monitor patient advancement through interdisciplinary progression rounds. If new patient transition needs arise, please place a TOC consult.  Patient denies any needs when discharge planning discussed

## 2023-04-19 NOTE — Progress Notes (Addendum)
PHARMACY - ANTICOAGULATION CONSULT NOTE  Pharmacy Consult for heparin infusion Indication: chest pain/ACS and AFib  Allergies  Allergen Reactions   Shellfish Allergy Anaphylaxis and Swelling    Other reaction(s): Other (See Comments)   Atorvastatin Calcium     Other Reaction(s): muscle pain   Rosuvastatin Calcium Other (See Comments)    Other Reaction(s): myalgia   Shellfish-Derived Products Other (See Comments) and Swelling    Patient Measurements: Height: 5\' 7"  (170.2 cm) Weight: 93.9 kg (207 lb 0.2 oz) IBW/kg (Calculated) : 61.6 Heparin Dosing Weight: 82.1 kg  Vital Signs: Temp: 97.8 F (36.6 C) (11/21 0903) Temp Source: Oral (11/21 0903) BP: 159/74 (11/21 1110) Pulse Rate: 86 (11/21 1110)  Labs: Recent Labs    04/18/23 1423 04/18/23 1731 04/19/23 0531 04/19/23 1216  HGB 12.0  --  11.2*  --   HCT 38.8  --  35.4*  --   PLT 172  --  163  --   APTT  --   --  119* 106*  HEPARINUNFRC  --   --  >1.10*  --   CREATININE 2.09*  --  1.90*  --   TROPONINIHS 11 12  --   --     Estimated Creatinine Clearance: 28.2 mL/min (A) (by C-G formula based on SCr of 1.9 mg/dL (H)).   Medical History: Past Medical History:  Diagnosis Date   Anxiety    Arthritis    back   Atrial fibrillation (HCC)    Breast cancer (HCC)    right breast cancer - 3 years ago   Chronic diastolic heart failure (HCC)    ECHO: 06/20/2017 - Grade 2 Diastolic Dysfunction    Coronary artery disease    Depression    Diabetes mellitus without complication (HCC)    diet   Dysrhythmia    afib   Fibromyalgia    Herniated disc    Hyperlipemia    Hypertension    Iron deficiency anemia    Longstanding persistent atrial fibrillation (HCC)    Lower back pain    Lumbar stenosis    L4-5   Personal history of chemotherapy    Personal history of radiation therapy    Pulmonary hypertension (HCC)    S/P Maze operation for atrial fibrillation 05/09/2018   Complete bilateral atrial lesion set using  bipolar radiofrequency and cryothermy ablation with clipping of LA appendage   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   Docetaxel/Cytoxan Q 3 Weeks x 4 cycles   Torn rotator cuff    Tricuspid regurgitation    Assessment: Diane [name] is a 82 y.o. year old female admitted on 04/18/2023 with chest pain radiating to back and concern for ACS. Eliquis 2.5mg  BID for afib prior to admission (LD 11/19 @ 1200). Pharmacy consulted to dose heparin.  Hgb 11.2, Plt 163  aPTT 119, supratherapeutic, on heparin at 1050 units/hr Repeat aPTT 106, remains supratherapeutic on heparin at 1050 units/hr Heparin level >1.10, supra-therapeutic as expected with eliquis prior to admission Discussed with  nurse, no issues overnight with infusion or bleeding.  Goal of Therapy:  Heparin level 0.3-0.7 units/ml aPTT 66-102 seconds Monitor platelets by anticoagulation protocol: Yes   Plan:  Decrease heparin infusion rate to 950 units/hr Recheck aPTT STAT to reassess if level was drawn accurately this AM Daily aPTT, heparin level, CBC, and monitoring for bleeding F/u plans for cath and restarting eliquis  Thank you for allowing pharmacy to participate in this patient's care.  Wilburn Cornelia, PharmD, BCPS Clinical Pharmacist 04/19/2023 2:00 PM   Please refer to [facility] for pharmacy phone number

## 2023-04-19 NOTE — Progress Notes (Signed)
  Echocardiogram 2D Echocardiogram has been performed.  Delcie Roch 04/19/2023, 2:14 PM

## 2023-04-19 NOTE — Care Management Obs Status (Signed)
MEDICARE OBSERVATION STATUS NOTIFICATION   Patient Details  Name: Diane [name] MRN: [id number] Date of Birth: June 12, 1940   Medicare Observation Status Notification Given:  Yes    Lockie Pares, RN 04/19/2023, 3:03 PM

## 2023-04-19 NOTE — Discharge Summary (Signed)
Physician Discharge Summary  Patient ID: Diane [name] MRN: [id number] DOB/AGE: 1941-01-13 82 y.o.  Admit date: 04/18/2023 Discharge date: 04/19/2023  Admission Diagnoses: Acute coronary syndrome Paroxysmal atrial fibrillation CAD HTN HLD Obesity S/P breast cancer S/P stroke MV disorder, 26 mm ring TV disorder, 28 mm ring  Discharge Diagnoses:  Principal Problem:   Acute coronary syndrome (HCC) Active problems: Chronic atrial fibrillation, CHA2DS2VASC score of 7 CAD HTN HLD Obesity S/P breast cancer S/P stroke MV disorder, 26 mm ring TV disorder, 28 mm ring Mild AV stenosis Hypokalemia CKD, IV Diet controlled type 2 DM  Discharged Condition: fair  Hospital Course: 82 years old black female with PMH of atrial fibrillation, prior mitral valve repair, prior tricuspid valve replacement presenting for chest pain. Patient states around 1115 she was at her dentist office. She was laying back when she developed severe pain in her chest. She felt like it went all over her body but did not specifically radiate. She tried nitro x 2 and that did not help. Last about 30 minutes and then resolved. It was better sitting up. Not worse with exertion. No nausea or vomiting or diaphoresis. No shortness of breath or pleuritic pain. She has not had pain like this before. She is currently asymptomatic and has been for the last few hours. She went to urgent care after trying a heart doctor. Urgent care sent her here. She was catheterized in December 2019, she had LAD with 30% proximal stenosis, diagonal 1 with 40 to 50% mid stenosis, patent left main, RCA with 40% proximal and 15 to 20% mid stenoses. She follows with Dr. Sharyn Lull.  Her troponin I levels were normal x 2. EKG showed atrial fibrillation with controlled ventricular response. Chest x-ray is without acute cardiopulmonary disease. She underwent NM myocardial perfusion stress test that did not show reversible ischemia.  Her lipid  panel was normal with LDL cholesterol of 52 mg. She has echocardiogram showing Mild LV systolic dysfunction, Mild AV stenosis and regurgitation, Mild MV stenosis and regurgitation and Mild TV stenosis and regurgitation. She will take 10 meq. Potassium daily with her amiodarone, Eliquis, Furosemide, spironolactone and pravastatin. She will see me in 1 week and Dr. Sharyn Lull and Primary care in 1 month.  Consults: cardiology  Significant Diagnostic Studies: labs: Near normal CBC, electrolytes except mild hypokalemia and creatinine of 1.90 mg/dL Troponin I normal x 2. Lipid level normal with LDL of 52 mg.  EKG: chronic atrial fibrillation.  Chest x-ray: without acute cardiopulmonary disease.  Nuclear myocardial perfusion stress test was negative for reversible ischemia.  Echocardiogram showing Mild LV systolic dysfunction, Mild AV stenosis and regurgitation, Mild MV stenosis and regurgitation with annuloplasty ring and Mild TV stenosis and regurgitation with annuloplasty ring.  Treatments: cardiac meds: Amiodarone, Furosemide, spironolactone and pravastatin and Potassium and anticoagulation: Eliquis  Discharge Exam: Blood pressure (!) 124/96, pulse 86, temperature 97.9 F (36.6 C), temperature source Oral, resp. rate 18, height 5\' 7"  (1.702 m), weight 90.5 kg, SpO2 98%. General appearance: alert, cooperative and appears stated age. Head: Normocephalic, atraumatic. Eyes: Brown eyes, pink conjunctiva, corneas clear.   Neck: No adenopathy, no carotid bruit, no JVD, supple, symmetrical, trachea midline and thyroid not enlarged. Resp: Clear to auscultation bilaterally. Cardio: Irregular rate and rhythm, S1, S2 normal, III/VI systolic and II/VI diastolic murmurs, no click, rub or gallop. GI: Soft, non-tender; bowel sounds normal; no organomegaly. Extremities: No edema, cyanosis or clubbing. Skin: Warm and dry.  Neurologic: Alert and oriented X 3, normal strength  and tone. Normal  coordination.  Disposition: Discharge disposition: 01-Home or Self Care        Allergies as of 04/19/2023       Reactions   Shellfish Allergy Anaphylaxis, Swelling   Other reaction(s): Other (See Comments)   Atorvastatin Calcium    Other Reaction(s): muscle pain   Rosuvastatin Calcium Other (See Comments)   Other Reaction(s): myalgia   Shellfish-derived Products Other (See Comments), Swelling        Medication List     STOP taking these medications    aspirin EC 81 MG tablet   colchicine 0.6 MG tablet   gabapentin 300 MG capsule Commonly known as: NEURONTIN   meloxicam 15 MG tablet Commonly known as: MOBIC   traZODone 50 MG tablet Commonly known as: DESYREL       TAKE these medications    acetaminophen 500 MG tablet Commonly known as: TYLENOL Take 500 mg by mouth every 6 (six) hours as needed (for pain.).   albuterol 108 (90 Base) MCG/ACT inhaler Commonly known as: VENTOLIN HFA Inhale 2 puffs into the lungs every 6 (six) hours as needed for wheezing or shortness of breath.   amiodarone 200 MG tablet Commonly known as: PACERONE Take 100 mg by mouth every other day.   BLUE-EMU MAXIMUM STRENGTH EX Apply 1 Application topically daily as needed.   busPIRone 5 MG tablet Commonly known as: BUSPAR Take 5 mg by mouth 2 (two) times daily.   Centrum Silver 50+Women Tabs Take 1 tablet by mouth daily.   co-enzyme Q-10 30 MG capsule Take 30 mg by mouth daily.   Eliquis 2.5 MG Tabs tablet Generic drug: apixaban Take 2.5 mg by mouth 2 (two) times daily.   ferrous sulfate 325 (65 FE) MG EC tablet Take 325 mg by mouth every other day.   furosemide 40 MG tablet Commonly known as: LASIX Take 1 tablet (40 mg total) by mouth daily.   loratadine 10 MG tablet Commonly known as: CLARITIN Take 10 mg by mouth daily as needed for allergies.   nitroGLYCERIN 0.4 MG SL tablet Commonly known as: NITROSTAT Place 0.4 mg under the tongue every 5 (five) minutes x  3 doses as needed for chest pain.   OneTouch Delica Plus Lancet33G Misc USE AS DIRECTED DAILY FOR 30 DAYS   OneTouch Verio test strip Generic drug: glucose blood FOR USE WHEN CHECKING BLOOD GLUCOSE ONCE DAILY ALTERNATING AM AND PM BEFORE MEALS FINGER STICK 90 DAYS   pantoprazole 40 MG tablet Commonly known as: PROTONIX Take 1 tablet by mouth once daily   potassium chloride 10 MEQ tablet Commonly known as: KLOR-CON M Take 1 tablet (10 mEq total) by mouth daily.   pravastatin 10 MG tablet Commonly known as: PRAVACHOL Take 10 mg by mouth at bedtime.   spironolactone 25 MG tablet Commonly known as: ALDACTONE Take 12.5 mg by mouth every morning.   Trelegy Ellipta 100-62.5-25 MCG/ACT Aepb Generic drug: Fluticasone-Umeclidin-Vilant Inhale 1 puff into the lungs daily.        Follow-up Information     Emilio Aspen, MD Follow up in 1 month(s).   Specialty: Internal Medicine Contact information: [address] [place] [address] [phone]         Orpah Cobb, MD Follow up in 1 week(s).   Specialty: Cardiology Contact information: [address] [place] [address] [phone]         Rinaldo Cloud, MD. Schedule an appointment as soon as possible for  a visit in 1 month(s).   Specialty: Cardiology Contact information: [address] [place] [address] [phone]                 Time spent: Review of old chart, current chart, lab, x-ray, cardiac tests and discussion with patient over 60 minutes.  Signed: Ricki Rodriguez 04/19/2023, 3:44 PM

## 2023-04-19 NOTE — Progress Notes (Signed)
PHARMACY - ANTICOAGULATION CONSULT NOTE  Pharmacy Consult for heparin infusion Indication: chest pain/ACS  Allergies  Allergen Reactions   Shellfish Allergy Anaphylaxis and Swelling    Other reaction(s): Other (See Comments)   Atorvastatin Calcium     Other Reaction(s): muscle pain   Rosuvastatin Calcium Other (See Comments)    Other Reaction(s): myalgia   Shellfish-Derived Products Other (See Comments) and Swelling    Patient Measurements: Height: 5\' 7"  (170.2 cm) Weight: 93.9 kg (207 lb 0.2 oz) IBW/kg (Calculated) : 61.6 Heparin Dosing Weight: 82.1 kg  Vital Signs: Temp: 97.8 F (36.6 C) (11/21 0903) Temp Source: Oral (11/21 0903) BP: 159/74 (11/21 1110) Pulse Rate: 86 (11/21 1110)  Labs: Recent Labs    04/18/23 1423 04/18/23 1731 04/19/23 0531  HGB 12.0  --  11.2*  HCT 38.8  --  35.4*  PLT 172  --  163  APTT  --   --  119*  HEPARINUNFRC  --   --  >1.10*  CREATININE 2.09*  --  1.90*  TROPONINIHS 11 12  --     Estimated Creatinine Clearance: 28.2 mL/min (A) (by C-G formula based on SCr of 1.9 mg/dL (H)).   Medical History: Past Medical History:  Diagnosis Date   Anxiety    Arthritis    back   Atrial fibrillation (HCC)    Breast cancer (HCC)    right breast cancer - 3 years ago   Chronic diastolic heart failure (HCC)    ECHO: 06/20/2017 - Grade 2 Diastolic Dysfunction    Coronary artery disease    Depression    Diabetes mellitus without complication (HCC)    diet   Dysrhythmia    afib   Fibromyalgia    Herniated disc    Hyperlipemia    Hypertension    Iron deficiency anemia    Longstanding persistent atrial fibrillation (HCC)    Lower back pain    Lumbar stenosis    L4-5   Personal history of chemotherapy    Personal history of radiation therapy    Pulmonary hypertension (HCC)    S/P Maze operation for atrial fibrillation 05/09/2018   Complete bilateral atrial lesion set using bipolar radiofrequency and cryothermy ablation with clipping of  LA appendage   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   Docetaxel/Cytoxan Q 3 Weeks x 4 cycles   Torn rotator cuff    Tricuspid regurgitation    Assessment: Diane [name] is a 82 y.o. year old female admitted on 04/18/2023 with chest pain radiating to back and concern for ACS. Eliquis 2.5mg  BID for afib prior to admission (LD 11/19 @ 1200). Pharmacy consulted to dose heparin.  aPTT 119, supratherapeutic, on heparin at 1050 units/hr Heparin level >1.10, supra-therapeutic as expected with eliquis prior to admission Current infusion running at 1050 units/hr. Discussed with nurse, no issues overnight with infusion or bleeding.  Unable to clarify if lab was drawn appropriately.   Goal of Therapy:  Heparin level 0.3-0.7 units/ml aPTT 66-102 seconds Monitor platelets  by anticoagulation protocol: Yes   Plan:  Continue heparin infusion at 1050 units/hr for now in the setting of no s/sx of bleeding Recheck aPTT STAT to reassess if level was drawn accurately this AM Daily aPTT, heparin level, CBC, and monitoring for bleeding F/u plans for cath and restarting eliquis  Thank you for allowing pharmacy to participate in this patient's care.  Wilburn Cornelia, PharmD, BCPS Clinical Pharmacist 04/19/2023 12:11 PM   Please refer to [facility] for pharmacy phone number

## 2023-04-20 LAB — LIPOPROTEIN A (LPA): Lipoprotein (a): 230.8 nmol/L — ABNORMAL HIGH (ref <75.0)

## 2023-04-23 DIAGNOSIS — Z9989 Dependence on other enabling machines and devices: Secondary | ICD-10-CM | POA: Diagnosis not present

## 2023-04-23 DIAGNOSIS — R059 Cough, unspecified: Secondary | ICD-10-CM | POA: Diagnosis not present

## 2023-04-23 DIAGNOSIS — I10 Essential (primary) hypertension: Secondary | ICD-10-CM | POA: Diagnosis not present

## 2023-06-08 DIAGNOSIS — E1169 Type 2 diabetes mellitus with other specified complication: Secondary | ICD-10-CM | POA: Diagnosis not present

## 2023-06-08 DIAGNOSIS — I10 Essential (primary) hypertension: Secondary | ICD-10-CM | POA: Diagnosis not present

## 2023-06-08 DIAGNOSIS — E782 Mixed hyperlipidemia: Secondary | ICD-10-CM | POA: Diagnosis not present

## 2023-06-08 DIAGNOSIS — I340 Nonrheumatic mitral (valve) insufficiency: Secondary | ICD-10-CM | POA: Diagnosis not present

## 2023-06-11 ENCOUNTER — Ambulatory Visit
Admission: RE | Admit: 2023-06-11 | Discharge: 2023-06-11 | Discharge status: Home / Self Care | Payer: 59 | Source: Ambulatory Visit | Attending: Adult Health | Admitting: Adult Health

## 2023-06-11 DIAGNOSIS — R0602 Shortness of breath: Secondary | ICD-10-CM | POA: Diagnosis not present

## 2023-06-11 DIAGNOSIS — R9389 Abnormal findings on diagnostic imaging of other specified body structures: Secondary | ICD-10-CM

## 2023-06-11 DIAGNOSIS — R0609 Other forms of dyspnea: Secondary | ICD-10-CM

## 2023-06-20 ENCOUNTER — Telehealth: Payer: Self-pay | Admitting: Adult Health

## 2023-06-20 NOTE — Telephone Encounter (Signed)
[facility]/Tiffany  calling with a call report for this PT.

## 2023-06-21 ENCOUNTER — Telehealth: Payer: Self-pay

## 2023-06-21 NOTE — Telephone Encounter (Signed)
-----   Message from [facility] sent at 06/21/2023  9:52 AM EST ----- CT chest shows no evidence of interstitial lung disease/fibrosis.  COPD and emphysematous changes.  Continue with COPD maintenance regimen   Incidental findings show atherosclerosis and heart valve replacements-continue follow-up with cardiology  There was a new incidental finding with enlarged cyst on liver-this will need further evaluation.  She needs to see her primary care provider in the next 1 to 2 weeks to determine next step for evaluation. Please make sure she understands that she needs OV with PCP.  Please place referral I will send CT results to PCP

## 2023-06-21 NOTE — Telephone Encounter (Signed)
I left a message for the patient to return my call.

## 2023-06-21 NOTE — Telephone Encounter (Signed)
Call report on HRCT 06/11/23 Impression:  IMPRESSION: 1. No evidence of fibrotic interstitial lung disease. 2. Mosaic pulmonary parenchymal attenuation can be seen in the setting of small airways disease. Expiratory phase imaging was not performed in true expiration, limiting the evaluation for air trapping. 3. Enlarging cystic lesion in the dome of the left hepatic lobe with an internal vague soft tissue component, the latter of which is new from 05/06/2018. Consider further evaluation with MR abdomen without and with contrast in further evaluation, as clinically indicated. These results will be called to the ordering clinician or representative by the Radiologist Assistant, and communication documented in the PACS or [employer]. 4.  Aortic atherosclerosis (ICD10-I70.0). 5. Enlarged pulmonic trunk, indicative of pulmonary arterial hypertension. 6.  Emphysema (ICD10-J43.9).     Electronically Signed   By: Leanna Battles M.D.   On: 06/20/2023 10:56  Routing to [employer]

## 2023-06-21 NOTE — Telephone Encounter (Signed)
See result note.  

## 2023-06-28 ENCOUNTER — Telehealth: Payer: Self-pay

## 2023-06-28 ENCOUNTER — Encounter: Payer: Self-pay | Admitting: Podiatry

## 2023-06-28 ENCOUNTER — Ambulatory Visit (INDEPENDENT_AMBULATORY_CARE_PROVIDER_SITE_OTHER): Payer: 59 | Admitting: Podiatry

## 2023-06-28 DIAGNOSIS — B351 Tinea unguium: Secondary | ICD-10-CM

## 2023-06-28 DIAGNOSIS — M79676 Pain in unspecified toe(s): Secondary | ICD-10-CM

## 2023-06-28 DIAGNOSIS — E1140 Type 2 diabetes mellitus with diabetic neuropathy, unspecified: Secondary | ICD-10-CM

## 2023-06-28 MED ORDER — MELOXICAM 15 MG PO TABS: 15.0000 mg | ORAL_TABLET | Freq: Every day | ORAL | 0 refills | Status: DC

## 2023-06-28 NOTE — Telephone Encounter (Addendum)
Patient in for visit with Dr. Stacie Acres for nail care. Requested refill on meloxicam 15 mg po every day. Refill sent to requested pharmacy with 0 additional fills. Advised patient to contact office for furture refills. Confirmed okay to fill with Dr. Allena Katz.

## 2023-06-28 NOTE — Progress Notes (Signed)
This patient returns to my office for at risk foot care.  This patient requires this care by a professional since this patient will be at risk due to having chronic kidney disease and diabetes.    This patient is unable to cut nails herself since the patient cannot reach her  nails.These nails are painful walking and wearing shoes.   Patient says she is now experiencing cramping and numbness especially to her left foot. This patient presents for at risk foot care today.   General Appearance  Alert, conversant and in no acute stress.  Vascular  Dorsalis pedis and .  Posterior tibial pulses  are weakly /absent palpable.Capillary return is within normal limits  bilaterally. Temperature is within normal limits  bilaterally.  Neurologic  Senn-Weinstein monofilament wire test diminished   bilaterally. Muscle power within normal limits bilaterally.  Nails Thick disfigured discolored nails with subungual debris  from hallux to fifth toes bilaterally. No evidence of bacterial infection or drainage bilaterally.  Orthopedic  No limitations of motion  feet .  No crepitus or effusions noted.  No bony pathology or digital deformities noted.  HAV  B/L.    Skin  normotropic skin with no porokeratosis noted bilaterally.  No signs of infections or ulcers noted.     Onychomycosis  Pain in right toes  Pain in left toes  Consent was obtained for treatment procedures.   Mechanical debridement of nails 1-5  bilaterally performed with a nail nipper.  Filed with dremel without incident.     Return office visit   3 months                   Told patient to return for periodic foot care and evaluation due to potential at risk complications.   Helane Gunther DPM

## 2023-07-09 DIAGNOSIS — N184 Chronic kidney disease, stage 4 (severe): Secondary | ICD-10-CM | POA: Diagnosis not present

## 2023-07-11 ENCOUNTER — Ambulatory Visit: Payer: 59 | Admitting: Pulmonary Disease

## 2023-07-11 ENCOUNTER — Encounter: Payer: Self-pay | Admitting: Pulmonary Disease

## 2023-07-11 VITALS — BP 130/64 | HR 60 | Temp 97.6°F | Ht 67.5 in | Wt 201.0 lb

## 2023-07-11 DIAGNOSIS — J432 Centrilobular emphysema: Secondary | ICD-10-CM

## 2023-07-11 DIAGNOSIS — K769 Liver disease, unspecified: Secondary | ICD-10-CM

## 2023-07-11 MED ORDER — TRELEGY ELLIPTA 200-62.5-25 MCG/ACT IN AEPB: 1.0000 | INHALATION_SPRAY | Freq: Every day | RESPIRATORY_TRACT | 11 refills | Status: AC

## 2023-07-11 NOTE — Progress Notes (Signed)
Synopsis: Referred for dyspnea by Emilio Aspen, *  Subjective:   PATIENT ID: Diane [name] [place] GENDER: female DOB: 09/11/1940, MRN: [id number]  Chief Complaint  Patient presents with   Follow-up    Review HRCT chest from 06/11/2023   83 y.o. history of AFs/p maze, breast cancer and SCC vulva, chronic diastolic heart failure, CAD, DM, MR s/p ring annuloplasty, PH/TR, emphysema, COPD, smoking less tobacco abuse in remission  New patient evaluation for me today.  Breathing stable.  Doing well.  No worsening dyspnea.  Thinks Trelegy helps.  Reports albuterol use 2-3 times a week.  During the day.  With exertion.  CT high-resolution reviewed, no ILD, emphysema noted.  Lungs otherwise look okay.  Discussed liver lesion seen on CT scan.  Present for some time.  There are some changes.  MRI recommended.  No one discussed results with her.  MyChart message was sent but she does not have access to MyChart.  She says no one called her.  There is documentation 1/29 that she was called.  And results were discussed.  Past Medical History:  Diagnosis Date   Anxiety    Arthritis    back   Atrial fibrillation (HCC)    Breast cancer (HCC)    right breast cancer - 3 years ago   Chronic diastolic heart failure (HCC)    ECHO: 06/20/2017 - Grade 2 Diastolic Dysfunction    Coronary artery disease    Depression    Diabetes mellitus without complication (HCC)    diet   Dysrhythmia    afib   Fibromyalgia    Herniated disc    Hyperlipemia    Hypertension    Iron deficiency anemia    Longstanding persistent atrial fibrillation (HCC)    Lower back pain    Lumbar stenosis    L4-5   Personal history of chemotherapy    Personal history of radiation therapy    Pulmonary hypertension (HCC)    S/P Maze operation for atrial fibrillation 05/09/2018   Complete bilateral atrial lesion set using bipolar radiofrequency and cryothermy ablation with clipping of LA appendage   S/P mitral valve repair  05/09/2018   26 mm Sorin Memo 4D ring annuloplasty   S/P radiation therapy 01/23/2013-03/06/2013   Right breast / 46 Gy in 23 fractions / Right breast boost / 14 Gy in 7 fractions   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   Docetaxel/Cytoxan Q 3 Weeks x 4 cycles   Torn rotator cuff    Tricuspid regurgitation      Family History  Problem Relation Age of Onset   Stroke Mother    Alzheimer's disease Mother    Heart attack Father    Heart attack Brother    Breast cancer Neg Hx    Colon cancer Neg Hx    Ovarian cancer Neg Hx    Endometrial cancer Neg Hx    Pancreatic cancer Neg Hx    Prostate cancer Neg Hx      Past Surgical History:  Procedure Laterality Date   ABDOMINAL HYSTERECTOMY     BREAST BIOPSY Right 09/04/2012   BREAST LUMPECTOMY Right 10/08/2012   CLIPPING OF ATRIAL APPENDAGE N/A 05/09/2018   Procedure: CLIPPING OF ATRIAL APPENDAGE USING ATRICLIP  PRO2 CLIP SIZE ;  Surgeon: Purcell Nails, MD;  Location: [facility] OR;  Service: Open Heart Surgery;  Laterality: N/A;   COLONOSCOPY     DILATION AND CURETTAGE OF UTERUS     LEFT HEART CATHETERIZATION WITH CORONARY ANGIOGRAM N/A 09/14/2014   Procedure: LEFT HEART CATHETERIZATION WITH CORONARY ANGIOGRAM;  Surgeon: Rinaldo Cloud, MD;  Location: [facility] CATH LAB;  Service: Cardiovascular;  Laterality: N/A;   MAZE N/A 05/09/2018   Procedure: MAZE;  Surgeon: Purcell Nails, MD;  Location: [facility] OR;  Service: Open Heart Surgery;  Laterality: N/A;   MITRAL VALVE REPAIR N/A 05/09/2018   Procedure: MITRAL VALVE REPAIR (MVR) USING MEMO 4D RING SIZE ;  Surgeon: Purcell Nails, MD;  Location: [facility] OR;  Service: Open Heart Surgery;  Laterality: N/A;   OPEN REDUCTION INTERNAL FIXATION (ORIF) PROXIMAL PHALANX Right 01/04/2016   Procedure:  OPEN TREATMENT OF RIGHT THUMB PROXIMAL PHALANX FRACTURE;  Surgeon: Mack Hook, MD;  Location: [facility];  Service: Orthopedics;  Laterality: Right;   PARTIAL MASTECTOMY WITH NEEDLE LOCALIZATION AND AXILLARY SENTINEL LYMPH NODE BX Right 10/08/2012   Procedure: RIGHT PARTIAL MASTECTOMY WITH NEEDLE LOCALIZATION AND RIGHT AXILLARY SENTINEL LYMPH NODE BX;  Surgeon: Ernestene Mention, MD;  Location: [facility] OR;  Service: General;  Laterality: Right;  Injection of 1% Methylene Blue dye into right breast   PORTACATH PLACEMENT Left 10/08/2012   Procedure: INSERTION PORT-A-CATH WITH ULTRASOUND GUIDANCE;  Surgeon: Ernestene Mention, MD;  Location: [facility] OR;  Service: General;  Laterality: Left;  Using 19F Powerport Clearvue   Posterior Lumbar Arthrodesis, Pedicle screw fixation, Posterolateral Arthodesis  03/17/11   RIGHT/LEFT HEART CATH AND CORONARY ANGIOGRAPHY N/A 05/02/2018   Procedure: RIGHT/LEFT HEART CATH AND CORONARY ANGIOGRAPHY;  Surgeon: Rinaldo Cloud, MD;  Location: [facility] INVASIVE CV LAB;  Service: Cardiovascular;  Laterality: N/A;   SHOULDER ARTHROSCOPY WITH ROTATOR CUFF REPAIR AND SUBACROMIAL DECOMPRESSION  06/04/2012   Procedure: SHOULDER ARTHROSCOPY WITH ROTATOR CUFF REPAIR AND SUBACROMIAL DECOMPRESSION;  Surgeon: Mable Paris, MD;  Location: [facility];  Service: Orthopedics;  Laterality: Left;  LEFT SHOULDER ARTHROSCOPY WITH ROTATOR CUFF REPAIR AND SUBACROMIAL DECOMPRESSION AND DISTAL CLAVICLE RESECTION   TEE WITHOUT CARDIOVERSION N/A 05/03/2018   Procedure: TRANSESOPHAGEAL ECHOCARDIOGRAM (TEE);  Surgeon: Orpah Cobb, MD;  Location: [facility] ENDOSCOPY;  Service: Cardiovascular;  Laterality: N/A;   TEE WITHOUT CARDIOVERSION N/A 05/09/2018   Procedure: TRANSESOPHAGEAL ECHOCARDIOGRAM (TEE);  Surgeon: Purcell Nails, MD;  Location: [facility] OR;  Service: Open Heart Surgery;  Laterality: N/A;   TONSILLECTOMY     TRICUSPID VALVE REPLACEMENT N/A 05/09/2018   Procedure: TRICUSPID  VALVE REPAIR USING MC3 RING SIZE ;  Surgeon: Purcell Nails, MD;  Location: [facility] OR;  Service: Open Heart Surgery;  Laterality: N/A;   TUBAL LIGATION     VULVECTOMY Left 02/06/2019   Procedure: POSTERIOR RADICAL VULVECTOMY;  Surgeon: Adolphus Birchwood, MD;  Location: [facility] ORS;  Service: Gynecology;  Laterality: Left;   VULVECTOMY PARTIAL     Wide Local Excision  01/2010   Bilateral groin excisions, re-excision of vulva    Social History   Socioeconomic History   Marital status: Widowed    Spouse name: Not on file   Number of children: 3   Years of education: Not on file   Highest education level: Not on file  Occupational History   Occupation: retired  Tobacco Use   Smoking status: Former    Current packs/day: 0.00    Average packs/day: 1 pack/day  for 50.0 years (50.0 ttl pk-yrs)    Types: Cigarettes    Start date: 05/30/1959    Quit date: 05/29/2009    Years since quitting: 14.1   Smokeless tobacco: Never  Vaping Use   Vaping status: Never Used  Substance and Sexual Activity   Alcohol use: Yes    Comment: rare   Drug use: No   Sexual activity: Not Currently  Other Topics Concern   Not on file  Social History Narrative   Tobacco use cigarettes: former smoker, quit in year Sept 2011, Pack-year Hx: 19, tobacco history last updated 10/10/2013. No smoking. Per patient stopped smoking 9 months ago Caffeine.: yes, 2+ servings daily, tea. No recreational drug use. Exercise: [employer] water aer[employer] (wants to start going again). Marital status: widow.         3 children, 5 grand and 4 great grands, all live in [place]      Likes to do word searches, read, watch news, daughter passed away last year      Lives in senior citizen community, neighbor keeps an eye on her   Social Drivers of Health   Financial Resource Strain: Not on file  Food Insecurity: No Food Insecurity (04/19/2023)   Hunger Vital Sign    Worried About Running Out of Food in the Last Year: Never true    Ran Out of Food in the  Last Year: Never true  Transportation Needs: No Transportation Needs (04/19/2023)   PRAPARE - Administrator, Civil Service (Medical): No    Lack of Transportation (Non-Medical): No  Physical Activity: Not on file  Stress: Not on file  Social Connections: Not on file  Intimate Partner Violence: Not At Risk (04/19/2023)   Humiliation, Afraid, Rape, and Kick questionnaire    Fear of Current or Ex-Partner: No    Emotionally Abused: No    Physically Abused: No    Sexually Abused: No     Allergies  Allergen Reactions   Shellfish Allergy Anaphylaxis and Swelling    Other reaction(s): Other (See Comments)   Atorvastatin Calcium     Other Reaction(s): muscle pain   Rosuvastatin Calcium Other (See Comments)    Other Reaction(s): myalgia   Shellfish-Derived Products Other (See Comments) and Swelling     Outpatient Medications Prior to Visit  Medication Sig Dispense Refill   acetaminophen (TYLENOL) 500 MG tablet Take 500 mg by mouth every 6 (six) hours as needed (for pain.).     albuterol (VENTOLIN HFA) 108 (90 Base) MCG/ACT inhaler Inhale 2 puffs into the lungs every 6 (six) hours as needed for wheezing or shortness of breath. 1 each 3   amiodarone (PACERONE) 200 MG tablet Take 100 mg by mouth every other day.     busPIRone (BUSPAR) 5 MG tablet Take 5 mg by mouth 2 (two) times daily.     co-enzyme Q-10 30 MG capsule Take 30 mg by mouth daily.     ELIQUIS 2.5 MG TABS tablet Take 2.5 mg by mouth 2 (two) times daily.     ferrous sulfate 325 (65 FE) MG EC tablet Take 325 mg by mouth every other day.     Fluticasone-Umeclidin-Vilant (TRELEGY ELLIPTA) 100-62.5-25 MCG/ACT AEPB Inhale 1 puff into the lungs daily. 1 each 11   furosemide (LASIX) 40 MG tablet Take 1 tablet (40 mg total) by mouth daily. 30 tablet 1   Lancets (ONETOUCH DELICA PLUS LANCET33G) MISC USE AS DIRECTED DAILY FOR 30 DAYS     loratadine (  CLARITIN) 10 MG tablet Take 10 mg by mouth daily as needed for allergies.      meloxicam (MOBIC) 15 MG tablet Take 1 tablet (15 mg total) by mouth daily. 30 tablet 0   Menthol, Topical Analgesic, (BLUE-EMU MAXIMUM STRENGTH EX) Apply 1 Application topically daily as needed.     Multiple Vitamins-Minerals (CENTRUM SILVER 50+WOMEN) TABS Take 1 tablet by mouth daily.     nitroGLYCERIN (NITROSTAT) 0.4 MG SL tablet Place 0.4 mg under the tongue every 5 (five) minutes x 3 doses as needed for chest pain.     ONETOUCH VERIO test strip FOR USE WHEN CHECKING BLOOD GLUCOSE ONCE DAILY ALTERNATING AM AND PM BEFORE MEALS FINGER STICK 90 DAYS  3   pantoprazole (PROTONIX) 40 MG tablet Take 1 tablet by mouth once daily 90 tablet 3   potassium chloride (KLOR-CON M) 10 MEQ tablet Take 1 tablet (10 mEq total) by mouth daily. 30 tablet 2   pravastatin (PRAVACHOL) 10 MG tablet Take 10 mg by mouth at bedtime.     spironolactone (ALDACTONE) 25 MG tablet Take 12.5 mg by mouth every morning.     No facility-administered medications prior to visit.       Objective:   Physical Exam:  General appearance: Elderly and robust, NAD, conversant  Eyes: anicteric sclerae; PERRL, tracking appropriately HENT: NCAT; MMM, hoarse Neck: Trachea midline; no lymphadenopathy, no JVD Lungs: CTAB, no crackles, no wheeze, with normal respiratory effort CV: RRR, no murmur  Abdomen: Soft, non-tender; non-distended, BS present  Extremities: No peripheral edema, warm Skin: Normal turgor and texture; no rash Psych: Appropriate affect Neuro: Alert and oriented to person and place, no focal deficit   Vitals:   07/11/23 1034  BP: 130/64  Pulse: 60  Temp: 97.6 F (36.4 C)  TempSrc: Oral  SpO2: 97%  Weight: 201 lb (91.2 kg)  Height: 5' 7.5" (1.715 m)    97% on RA BMI Readings from Last 3 Encounters:  07/11/23 31.02 kg/m  04/19/23 31.25 kg/m  04/10/23 31.97 kg/m   Wt Readings from Last 3 Encounters:  07/11/23 201 lb (91.2 kg)  04/19/23 199 lb 8.3 oz (90.5 kg)  04/10/23 207 lb 3.2 oz (94 kg)      CBC    Component Value Date/Time   WBC 7.7 04/19/2023 0531   RBC 4.10 04/19/2023 0531   HGB 11.2 (L) 04/19/2023 0531   HGB 10.6 (L) 01/11/2015 1054   HCT 35.4 (L) 04/19/2023 0531   HCT 33.1 (L) 01/11/2015 1054   PLT 163 04/19/2023 0531   PLT 207 01/11/2015 1054   MCV 86.3 04/19/2023 0531   MCV 84.1 01/11/2015 1054   MCH 27.3 04/19/2023 0531   MCHC 31.6 04/19/2023 0531   RDW 15.4 04/19/2023 0531   RDW 16.0 (H) 01/11/2015 1054   LYMPHSABS 2.2 04/19/2022 1328   LYMPHSABS 1.4 01/11/2015 1054   MONOABS 0.6 04/19/2022 1328   MONOABS 0.5 01/11/2015 1054   EOSABS 0.1 04/19/2022 1328   EOSABS 0.1 01/11/2015 1054   BASOSABS 0.0 04/19/2022 1328   BASOSABS 0.1 01/11/2015 1054    Chest Imaging: CXR 02/19/21 reviewed by me with prominent vasculature, cardiomegaly  CTA Dissection protocol 05/06/18 reviewed by me with mosaic attenuation, cardiomegaly, small stable right effusion  Pulmonary Functions Testing Results:    Latest Ref Rng & Units 10/02/2022    2:18 PM 06/14/2022   10:47 AM 02/06/2018   11:44 AM  PFT Results  FVC-Pre L 1.62  1.35  1.26   FVC-Predicted  Pre % 53  46  51   FVC-Post L  1.43  1.32   FVC-Predicted Post %  49  54   Pre FEV1/FVC % % 82  83  83   Post FEV1/FCV % %  82  87   FEV1-Pre L 1.33  1.12  1.05   FEV1-Predicted Pre % 58  51  55   FEV1-Post L  1.17  1.15   DLCO uncorrected ml/min/mmHg 10.08  8.94  11.02   DLCO UNC% % 48  44  40   DLCO corrected ml/min/mmHg 10.08  8.94  11.96   DLCO COR %Predicted % 48  44  44   DLVA Predicted % 72  85  77   TLC L   4.11   TLC % Predicted %   76   RV % Predicted %   95    PFT 06/14/22 stable  PFT 2019 with Mixed moderate obstruction (FEV1/SVC 57, FEV1 post BD 61) and restriction, severely reduced diffusing capacity    Echocardiogram:   TTE 05/16/18:  - Left ventricle: The cavity size was normal. Systolic function was    normal. The estimated ejection fraction was in the range of 55%    to 60%. Wall motion  was normal; there were no regional wall    motion abnormalities. Doppler parameters are consistent with    abnormal left ventricular relaxation (grade 1 diastolic    dysfunction).  - Aortic valve: There was mild to moderate regurgitation. Valve    area (VTI): 2.11 cm^2. Valve area (Vmax): 1.99 cm^2. Valve area    (Vmean): 2.15 cm^2.  - Mitral valve: Valve area by continuity equation (using LVOT    flow): 1.91 cm^2.  - Left atrium: The atrium was mildly dilated.  - Atrial septum: No defect or patent foramen ovale was identified.  - Pulmonic valve: Peak gradient (S): 12 mm Hg.     Assessment & Plan:   # Dyspnea # COPD gold functional group B # Liver lesion on CT scan  Stable on Trelegy.  Albuterol use 2-3 times a week.  Increase Trelegy to high-dose to see if we can decrease risk inhaler use, overall improved dyspnea.  Discussed liver lesion and need for follow-up with PCP regarding ordering MRI at [facility].  Plan: - continue protonix 40 mg 30 minutes before dinner EVERY night - continue trelegy 1 puff once daily rinse mouth and gargle after use - Instructions to discuss with PCP recommendation for follow-up MRI abdomen pelvis as it regards to liver lesion and to arrange additional follow-up as needed based on results.  She has no recollection of discussed results with her, there is MyChart documentation of results and recommendations discussed with her to discuss with PCP and arrange MRI follow-up 06/27/2023   RTC 4 months or sooner as needed with Dr. Judeth Horn  I spent 40 minutes in the care of the patient including review of records face-to-face visit coordination of care.  Karren Burly, MD [facility] 07/11/2023 10:35 AM

## 2023-07-11 NOTE — Patient Instructions (Addendum)
I increase the Trelegy to the high dose, rinse mouth with water after every use.  1 puff once a day.  See if this helps reduce albuterol use, help with daily symptoms.  Please contact your primary care physician, Dr. Orson Aloe.  The CT scan of the chest demonstrated this lesion or a spot on the liver.  It was recommended an MRI of the abdomen with and without contrast to further evaluate.  We recommend your primary care doctor ordered this and followed up results and any next steps.  Return to clinic in 4 months with Dr. Judeth Horn

## 2023-07-16 ENCOUNTER — Other Ambulatory Visit: Payer: Self-pay | Admitting: Internal Medicine

## 2023-07-16 DIAGNOSIS — E1122 Type 2 diabetes mellitus with diabetic chronic kidney disease: Secondary | ICD-10-CM | POA: Diagnosis not present

## 2023-07-16 DIAGNOSIS — D631 Anemia in chronic kidney disease: Secondary | ICD-10-CM | POA: Diagnosis not present

## 2023-07-16 DIAGNOSIS — K769 Liver disease, unspecified: Secondary | ICD-10-CM

## 2023-07-16 DIAGNOSIS — I129 Hypertensive chronic kidney disease with stage 1 through stage 4 chronic kidney disease, or unspecified chronic kidney disease: Secondary | ICD-10-CM | POA: Diagnosis not present

## 2023-07-16 DIAGNOSIS — N2581 Secondary hyperparathyroidism of renal origin: Secondary | ICD-10-CM | POA: Diagnosis not present

## 2023-07-16 DIAGNOSIS — N189 Chronic kidney disease, unspecified: Secondary | ICD-10-CM | POA: Diagnosis not present

## 2023-07-16 DIAGNOSIS — N184 Chronic kidney disease, stage 4 (severe): Secondary | ICD-10-CM | POA: Diagnosis not present

## 2023-08-01 ENCOUNTER — Inpatient Hospital Stay: Payer: 59 | Attending: Obstetrics & Gynecology | Admitting: Gynecologic Oncology

## 2023-08-01 VITALS — BP 142/66 | HR 63 | Temp 97.6°F | Resp 20 | Wt 201.1 lb

## 2023-08-01 DIAGNOSIS — Z8544 Personal history of malignant neoplasm of other female genital organs: Secondary | ICD-10-CM | POA: Insufficient documentation

## 2023-08-01 DIAGNOSIS — C519 Malignant neoplasm of vulva, unspecified: Secondary | ICD-10-CM

## 2023-08-01 NOTE — Patient Instructions (Addendum)
 Your pelvic exam today had no concerning findings.   Plan to follow up in six months with Dr. Tamela Oddi or sooner if needed.   Please call for any new symptoms or needs.   Symptoms to report to your health care team include vulvar symptoms such as soreness/itching/pain, vaginal bleeding, rectal bleeding, bloating, weight loss without effort, new and persistent pain, new and  persistent fatigue, new leg swelling, new masses (i.e., bumps in your neck or groin), new and persistent cough, new and persistent nausea and vomiting, change in bowel or bladder habits, and any other concerns.

## 2023-08-01 NOTE — Progress Notes (Unsigned)
 Follow Up Note: Gyn-Onc  Diane [name] 83 y.o. female  CC: She returns for a f/u visit  HPI: The oncology history was reviewed.  Interval History: She denies any new lesions, itching, leg/groin pain, urinary symptoms, leg swelling, weight loss or cough.   Less recurrent "boils" involving the buttocks.  W/U for the breast lump she had discovered at the last visit was neg per her report.   She has had not any recent boils. Has pain patches topical on both sides of her neck. Seeing cardiology on Friday given new chest pressure. No vulvar symptoms. Bowels and bladder functioning without difficulty. No vaginal bleeding or discharge.     Review of Systems  Review of Systems  Constitutional:  Negative for malaise/fatigue and weight loss.  Respiratory:  Negative for shortness of breath and wheezing.   Cardiovascular:  Negative for chest pain and leg swelling.  Gastrointestinal:  Negative for abdominal pain, blood in stool, constipation, nausea and vomiting.  Genitourinary:  Negative for dysuria, frequency, hematuria and urgency.  Musculoskeletal:  Negative for joint pain and myalgias.  Neurological:  Negative for weakness.  Psychiatric/Behavioral:  Negative for depression. The patient does not have insomnia.    Current medications, allergy, social history, past surgical history, past medical history, family history were all reviewed.    Vitals:  BP (!) 142/66 Comment: manual recheck, Pt states has not taken BP med this morning.  Pulse 63   Temp 97.6 F (36.4 C) (Oral)   Resp 20   Wt 201 lb 1.6 oz (91.2 kg)   SpO2 97%   BMI 31.03 kg/m    Physical Exam Exam conducted with a chaperone present.  Constitutional:      General: She is not in acute distress. Breasts RUQ:no discrete mass--firm area involving lumpectomy scar, minimal skin puckering Cardiovascular:     Rate and Rhythm: Normal rate and regular rhythm.  Pulmonary:     Effort: Pulmonary effort is normal.     Breath  sounds: Normal breath sounds. No wheezing or rhonchi.  Abdominal:     Palpations: Abdomen is soft.     Tenderness: There is no abdominal tenderness. There is no right CVA tenderness or left CVA tenderness.     Hernia: No hernia is present.  Genitourinary:    General: Normal vulva.     Urethra: No urethral lesion.     Vagina: No lesions. No bleeding.  Scant, thick, yellow discharge Musculoskeletal:     Cervical back: Neck supple.     Right lower leg: No edema.     Left lower leg: No edema.  Lymphadenopathy:     Upper Body:     Right upper body: No supraclavicular adenopathy.     Left upper body: No supraclavicular adenopathy.     Lower Body: No right inguinal adenopathy. No left inguinal adenopathy.  Skin:    Findings: No rash.  Neurological:     Mental Status: She is oriented to person, place, and time.   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    Diane Bode, MD

## 2023-08-03 DIAGNOSIS — E1122 Type 2 diabetes mellitus with diabetic chronic kidney disease: Secondary | ICD-10-CM | POA: Diagnosis not present

## 2023-08-03 DIAGNOSIS — N184 Chronic kidney disease, stage 4 (severe): Secondary | ICD-10-CM | POA: Diagnosis not present

## 2023-08-03 DIAGNOSIS — Z0000 Encounter for general adult medical examination without abnormal findings: Secondary | ICD-10-CM | POA: Diagnosis not present

## 2023-08-03 DIAGNOSIS — I4819 Other persistent atrial fibrillation: Secondary | ICD-10-CM | POA: Diagnosis not present

## 2023-08-03 DIAGNOSIS — E7800 Pure hypercholesterolemia, unspecified: Secondary | ICD-10-CM | POA: Diagnosis not present

## 2023-08-03 DIAGNOSIS — M2550 Pain in unspecified joint: Secondary | ICD-10-CM | POA: Diagnosis not present

## 2023-08-03 DIAGNOSIS — G4700 Insomnia, unspecified: Secondary | ICD-10-CM | POA: Diagnosis not present

## 2023-08-03 DIAGNOSIS — D6869 Other thrombophilia: Secondary | ICD-10-CM | POA: Diagnosis not present

## 2023-08-03 DIAGNOSIS — I10 Essential (primary) hypertension: Secondary | ICD-10-CM | POA: Diagnosis not present

## 2023-08-03 DIAGNOSIS — R54 Age-related physical debility: Secondary | ICD-10-CM | POA: Diagnosis not present

## 2023-08-03 DIAGNOSIS — I5022 Chronic systolic (congestive) heart failure: Secondary | ICD-10-CM | POA: Diagnosis not present

## 2023-08-03 DIAGNOSIS — J449 Chronic obstructive pulmonary disease, unspecified: Secondary | ICD-10-CM | POA: Diagnosis not present

## 2023-08-06 DIAGNOSIS — E1122 Type 2 diabetes mellitus with diabetic chronic kidney disease: Secondary | ICD-10-CM | POA: Diagnosis not present

## 2023-09-06 ENCOUNTER — Ambulatory Visit
Admission: RE | Admit: 2023-09-06 | Discharge: 2023-09-06 | Disposition: A | Discharge status: Home / Self Care | Source: Ambulatory Visit | Attending: Internal Medicine | Admitting: Internal Medicine

## 2023-09-06 DIAGNOSIS — K769 Liver disease, unspecified: Secondary | ICD-10-CM

## 2023-09-06 DIAGNOSIS — K7689 Other specified diseases of liver: Secondary | ICD-10-CM | POA: Diagnosis not present

## 2023-09-06 DIAGNOSIS — Z853 Personal history of malignant neoplasm of breast: Secondary | ICD-10-CM | POA: Diagnosis not present

## 2023-09-06 MED ORDER — GADOPICLENOL 0.5 MMOL/ML IV SOLN
9.0000 mL | Freq: Once | INTRAVENOUS | Status: AC | PRN
  Administered 2023-09-06: 9 mL via INTRAVENOUS

## 2023-09-07 DIAGNOSIS — E1122 Type 2 diabetes mellitus with diabetic chronic kidney disease: Secondary | ICD-10-CM | POA: Diagnosis not present

## 2023-09-07 DIAGNOSIS — I10 Essential (primary) hypertension: Secondary | ICD-10-CM | POA: Diagnosis not present

## 2023-09-07 DIAGNOSIS — I340 Nonrheumatic mitral (valve) insufficiency: Secondary | ICD-10-CM | POA: Diagnosis not present

## 2023-09-07 DIAGNOSIS — Z8679 Personal history of other diseases of the circulatory system: Secondary | ICD-10-CM | POA: Diagnosis not present

## 2023-09-25 ENCOUNTER — Encounter: Payer: Self-pay | Admitting: Podiatry

## 2023-09-25 ENCOUNTER — Ambulatory Visit (INDEPENDENT_AMBULATORY_CARE_PROVIDER_SITE_OTHER): Payer: Medicare HMO | Admitting: Podiatry

## 2023-09-25 DIAGNOSIS — E1140 Type 2 diabetes mellitus with diabetic neuropathy, unspecified: Secondary | ICD-10-CM

## 2023-09-25 DIAGNOSIS — B351 Tinea unguium: Secondary | ICD-10-CM | POA: Diagnosis not present

## 2023-09-25 DIAGNOSIS — M79676 Pain in unspecified toe(s): Secondary | ICD-10-CM | POA: Diagnosis not present

## 2023-09-25 NOTE — Progress Notes (Signed)
 This patient returns to my office for at risk foot care.  This patient requires this care by a professional since this patient will be at risk due to having chronic kidney disease and diabetes.    This patient is unable to cut nails herself since the patient cannot reach her  nails.These nails are painful walking and wearing shoes.   Patient says she is now experiencing cramping and numbness especially to her left foot. This patient presents for at risk foot care today.   General Appearance  Alert, conversant and in no acute stress.  Vascular  Dorsalis pedis and .  Posterior tibial pulses  are weakly /absent palpable.Capillary return is within normal limits  bilaterally. Temperature is within normal limits  bilaterally.  Neurologic  Senn-Weinstein monofilament wire test diminished   bilaterally. Muscle power within normal limits bilaterally.  Nails Thick disfigured discolored nails with subungual debris  from hallux to fifth toes bilaterally. No evidence of bacterial infection or drainage bilaterally.  Orthopedic  No limitations of motion  feet .  No crepitus or effusions noted.  No bony pathology or digital deformities noted.  HAV  B/L.    Skin  normotropic skin with no porokeratosis noted bilaterally.  No signs of infections or ulcers noted.     Onychomycosis  Pain in right toes  Pain in left toes  Consent was obtained for treatment procedures.   Mechanical debridement of nails 1-5  bilaterally performed with a nail nipper.  Filed with dremel without incident.     Return office visit   3 months                   Told patient to return for periodic foot care and evaluation due to potential at risk complications.   Helane Gunther DPM

## 2023-09-28 ENCOUNTER — Other Ambulatory Visit: Payer: Self-pay | Admitting: Internal Medicine

## 2023-09-28 DIAGNOSIS — Z1231 Encounter for screening mammogram for malignant neoplasm of breast: Secondary | ICD-10-CM

## 2023-10-09 ENCOUNTER — Other Ambulatory Visit: Payer: Self-pay | Admitting: Internal Medicine

## 2023-10-09 DIAGNOSIS — N642 Atrophy of breast: Secondary | ICD-10-CM

## 2023-10-10 ENCOUNTER — Ambulatory Visit

## 2023-10-10 ENCOUNTER — Ambulatory Visit
Admission: RE | Admit: 2023-10-10 | Discharge: 2023-10-10 | Disposition: A | Discharge status: Home / Self Care | Source: Ambulatory Visit | Attending: Internal Medicine | Admitting: Internal Medicine

## 2023-10-10 DIAGNOSIS — Z853 Personal history of malignant neoplasm of breast: Secondary | ICD-10-CM | POA: Diagnosis not present

## 2023-10-10 DIAGNOSIS — N642 Atrophy of breast: Secondary | ICD-10-CM

## 2023-10-10 DIAGNOSIS — Z08 Encounter for follow-up examination after completed treatment for malignant neoplasm: Secondary | ICD-10-CM | POA: Diagnosis not present

## 2023-10-15 DIAGNOSIS — H0288B Meibomian gland dysfunction left eye, upper and lower eyelids: Secondary | ICD-10-CM | POA: Diagnosis not present

## 2023-10-15 DIAGNOSIS — Z961 Presence of intraocular lens: Secondary | ICD-10-CM | POA: Diagnosis not present

## 2023-10-15 DIAGNOSIS — H0288A Meibomian gland dysfunction right eye, upper and lower eyelids: Secondary | ICD-10-CM | POA: Diagnosis not present

## 2023-10-15 DIAGNOSIS — E119 Type 2 diabetes mellitus without complications: Secondary | ICD-10-CM | POA: Diagnosis not present

## 2023-10-22 DIAGNOSIS — N184 Chronic kidney disease, stage 4 (severe): Secondary | ICD-10-CM | POA: Diagnosis not present

## 2023-10-24 DIAGNOSIS — N184 Chronic kidney disease, stage 4 (severe): Secondary | ICD-10-CM | POA: Diagnosis not present

## 2023-11-02 ENCOUNTER — Other Ambulatory Visit: Payer: Self-pay | Admitting: Internal Medicine

## 2023-11-02 ENCOUNTER — Other Ambulatory Visit

## 2023-11-02 ENCOUNTER — Encounter

## 2023-11-02 DIAGNOSIS — D631 Anemia in chronic kidney disease: Secondary | ICD-10-CM | POA: Diagnosis not present

## 2023-11-02 DIAGNOSIS — Z1231 Encounter for screening mammogram for malignant neoplasm of breast: Secondary | ICD-10-CM

## 2023-11-02 DIAGNOSIS — I129 Hypertensive chronic kidney disease with stage 1 through stage 4 chronic kidney disease, or unspecified chronic kidney disease: Secondary | ICD-10-CM | POA: Diagnosis not present

## 2023-11-02 DIAGNOSIS — N184 Chronic kidney disease, stage 4 (severe): Secondary | ICD-10-CM | POA: Diagnosis not present

## 2023-11-02 DIAGNOSIS — E1122 Type 2 diabetes mellitus with diabetic chronic kidney disease: Secondary | ICD-10-CM | POA: Diagnosis not present

## 2023-11-02 DIAGNOSIS — N2581 Secondary hyperparathyroidism of renal origin: Secondary | ICD-10-CM | POA: Diagnosis not present

## 2023-11-05 ENCOUNTER — Ambulatory Visit
Admission: RE | Admit: 2023-11-05 | Discharge: 2023-11-05 | Disposition: A | Discharge status: Home / Self Care | Source: Ambulatory Visit | Attending: Internal Medicine | Admitting: Internal Medicine

## 2023-11-05 DIAGNOSIS — Z1231 Encounter for screening mammogram for malignant neoplasm of breast: Secondary | ICD-10-CM | POA: Diagnosis not present

## 2023-11-13 ENCOUNTER — Other Ambulatory Visit: Payer: Self-pay | Admitting: Pulmonary Disease

## 2023-11-13 MED ORDER — PANTOPRAZOLE SODIUM 40 MG PO TBEC: 40.0000 mg | DELAYED_RELEASE_TABLET | Freq: Every day | ORAL | 3 refills | Status: AC

## 2023-11-13 NOTE — Telephone Encounter (Signed)
 Copied from CRM [id number]. Topic: Clinical - Medication Refill >> Nov 13, 2023  1:32 PM Eveleen Hinds B wrote: Medication:  pantoprazole  (PROTONIX ) 40 MG tablet  Has the patient contacted their pharmacy? Yes (Agent: If no, request that the patient contact the pharmacy for the refill. If patient does not wish to contact the pharmacy document the reason why and proceed with request.) (Agent: If yes, when and what did the pharmacy advise?)  This is the patient's preferred pharmacy:  [id number] - [place] - [address] [place] ([place]) [place] [address] Phone: [phone] Fax: [phone]  Is this the correct pharmacy for this prescription? Yes If no, delete pharmacy and type the correct one.   Has the prescription been filled recently? Yes  Is the patient out of the medication? Yes  Has the patient been seen for an appointment in the last year OR does the patient have an upcoming appointment? Yes  Can we respond through MyChart? No  Agent: Please be advised that Rx refills may take up to 3 business days. We ask that you follow-up with your pharmacy.

## 2023-12-14 DIAGNOSIS — E1122 Type 2 diabetes mellitus with diabetic chronic kidney disease: Secondary | ICD-10-CM | POA: Diagnosis not present

## 2023-12-14 DIAGNOSIS — E782 Mixed hyperlipidemia: Secondary | ICD-10-CM | POA: Diagnosis not present

## 2023-12-14 DIAGNOSIS — I340 Nonrheumatic mitral (valve) insufficiency: Secondary | ICD-10-CM | POA: Diagnosis not present

## 2023-12-14 DIAGNOSIS — I10 Essential (primary) hypertension: Secondary | ICD-10-CM | POA: Diagnosis not present

## 2023-12-18 ENCOUNTER — Telehealth: Payer: Self-pay

## 2023-12-18 NOTE — Telephone Encounter (Signed)
 Returned patient call and scheduled her follow up appointment with Dr Rogelio on 9/3 at 1pm.. patient confirmed.SABRA

## 2023-12-21 DIAGNOSIS — M2550 Pain in unspecified joint: Secondary | ICD-10-CM | POA: Diagnosis not present

## 2023-12-21 DIAGNOSIS — E7800 Pure hypercholesterolemia, unspecified: Secondary | ICD-10-CM | POA: Diagnosis not present

## 2023-12-21 DIAGNOSIS — R54 Age-related physical debility: Secondary | ICD-10-CM | POA: Diagnosis not present

## 2023-12-21 DIAGNOSIS — J449 Chronic obstructive pulmonary disease, unspecified: Secondary | ICD-10-CM | POA: Diagnosis not present

## 2023-12-21 DIAGNOSIS — I5022 Chronic systolic (congestive) heart failure: Secondary | ICD-10-CM | POA: Diagnosis not present

## 2023-12-21 DIAGNOSIS — D6869 Other thrombophilia: Secondary | ICD-10-CM | POA: Diagnosis not present

## 2023-12-21 DIAGNOSIS — E1122 Type 2 diabetes mellitus with diabetic chronic kidney disease: Secondary | ICD-10-CM | POA: Diagnosis not present

## 2023-12-21 DIAGNOSIS — I4819 Other persistent atrial fibrillation: Secondary | ICD-10-CM | POA: Diagnosis not present

## 2023-12-21 DIAGNOSIS — I10 Essential (primary) hypertension: Secondary | ICD-10-CM | POA: Diagnosis not present

## 2023-12-21 DIAGNOSIS — N184 Chronic kidney disease, stage 4 (severe): Secondary | ICD-10-CM | POA: Diagnosis not present

## 2023-12-24 ENCOUNTER — Encounter: Payer: Self-pay | Admitting: Podiatry

## 2023-12-24 ENCOUNTER — Ambulatory Visit (INDEPENDENT_AMBULATORY_CARE_PROVIDER_SITE_OTHER): Payer: 59 | Admitting: Podiatry

## 2023-12-24 DIAGNOSIS — M79676 Pain in unspecified toe(s): Secondary | ICD-10-CM

## 2023-12-24 DIAGNOSIS — B351 Tinea unguium: Secondary | ICD-10-CM

## 2023-12-24 DIAGNOSIS — E1140 Type 2 diabetes mellitus with diabetic neuropathy, unspecified: Secondary | ICD-10-CM

## 2023-12-24 NOTE — Progress Notes (Signed)
 This patient returns to my office for at risk foot care.  This patient requires this care by a professional since this patient will be at risk due to having chronic kidney disease and diabetes.    This patient is unable to cut nails herself since the patient cannot reach her  nails.These nails are painful walking and wearing shoes.   Patient says she is now experiencing cramping and numbness especially to her left foot. This patient presents for at risk foot care today.   General Appearance  Alert, conversant and in no acute stress.  Vascular  Dorsalis pedis and .  Posterior tibial pulses  are weakly /absent palpable.Capillary return is within normal limits  bilaterally. Temperature is within normal limits  bilaterally.  Neurologic  Senn-Weinstein monofilament wire test diminished   bilaterally. Muscle power within normal limits bilaterally.  Nails Thick disfigured discolored nails with subungual debris  from hallux to fifth toes bilaterally. No evidence of bacterial infection or drainage bilaterally.  Orthopedic  No limitations of motion  feet .  No crepitus or effusions noted.  No bony pathology or digital deformities noted.  HAV  B/L.    Skin  normotropic skin with no porokeratosis noted bilaterally.  No signs of infections or ulcers noted.     Onychomycosis  Pain in right toes  Pain in left toes  Consent was obtained for treatment procedures.   Mechanical debridement of nails 1-5  bilaterally performed with a nail nipper.  Filed with dremel without incident.     Return office visit   3 months                   Told patient to return for periodic foot care and evaluation due to potential at risk complications.   Helane Gunther DPM

## 2024-01-30 ENCOUNTER — Telehealth: Payer: Self-pay | Admitting: *Deleted

## 2024-01-30 ENCOUNTER — Inpatient Hospital Stay: Attending: Obstetrics & Gynecology | Admitting: Obstetrics & Gynecology

## 2024-01-30 ENCOUNTER — Encounter: Payer: Self-pay | Admitting: Obstetrics & Gynecology

## 2024-01-30 VITALS — BP 131/64 | HR 62 | Temp 97.6°F | Resp 18 | Ht 67.5 in | Wt 193.0 lb

## 2024-01-30 DIAGNOSIS — Z86002 Personal history of in-situ neoplasm of other and unspecified genital organs: Secondary | ICD-10-CM | POA: Diagnosis not present

## 2024-01-30 DIAGNOSIS — C519 Malignant neoplasm of vulva, unspecified: Secondary | ICD-10-CM

## 2024-01-30 DIAGNOSIS — Z8544 Personal history of malignant neoplasm of other female genital organs: Secondary | ICD-10-CM | POA: Diagnosis not present

## 2024-01-30 MED ORDER — KETOCONAZOLE-HYDROCORTISONE 2 & 1 % EX KIT: 1.0000 | PACK | Freq: Every day | CUTANEOUS | 0 refills | Status: AC | PRN

## 2024-01-30 NOTE — Patient Instructions (Addendum)
 Return in 6 months. Please call at the beginning of the year to schedule an appointment with Dr.Jackson-Moore in March 2026.

## 2024-01-30 NOTE — Telephone Encounter (Signed)
 Spoke with patient and relayed message about the Ketoconazole -Hydrocortisone  2 & 1% kit that [facility] doesn't carry and the high cost. Patient can try the over the counter Ketoconazole  over the counter. Pt verbalized understanding and thanked the office for calling.

## 2024-01-30 NOTE — Progress Notes (Signed)
 Follow Up Note: Gyn-Onc  Diane  Drye [name] 83 y.o. female  CC: Follow up surveillance visit  HPI:  Diane  [name] is a 83 year old woman who was initially seen in consultation at the request of Dr. 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   In a remote history, the patient was seeing Dr. Elenore Simmonds up until 2016.  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  This was treated with a radical left posterior vulvectomy with bilateral inguinal lymphadenectomy.  Final pathology showed no metastatic disease.  She required no adjuvant therapy at that time.  She remained free of disease for 5 years and was discharged from care.   In December 2019, she underwent open heart surgery for a mitral and tricuspid valve replacement.  Following the surgery, she was placed on Coumadin  therapy and began experiencing intermittent vaginal spotting.  She was seen by Dr. Rosalva on December 25, 2018.  Dr. Rosalva performed a gynecologic evaluation and upon inspection of the external genitalia identified a fleshy pink exophytic 1.5 cm growth in the left introitus posteriorly.  This was biopsied on December 30, 2018 revealed high-grade vulvar intraepithelial neoplasia, VIN 3.  The biopsy margins were involved.   On February 06, 2019 she underwent a radical posterior left vulvectomy.  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  It had a depth of invasion of 1 mm.  No lymphovascular space invasion was identified.  The peripheral margins were negative for invasive carcinoma as was the deep margin.  The deep margin abutted the anterior rectal wall.  This margin was 4 mm from the tumor.  The closest peripheral margin was at 9:00 which represented to the midline vaginal introitus and was 6 mm from this margin.  This same 9:00 margin was involved by VIN 2-3. No further therapy was  recommended.  Interval History: Discussed the use of AI scribe software for clinical note transcription with the patient, who gave verbal consent to proceed. She experiences recurrent boils or pimples on the vulva and under her breast. These boils appear intermittently, lasting about three to four days, and she applies a topical steroid which helps them resolve. These boils do not cause itching. Hygiene practices include shaving.  No other concerning symptoms are reported.   Review of Systems  Review of Systems  Constitutional:  Negative for malaise/fatigue and weight loss.  Respiratory:  Negative for shortness of breath and wheezing.   Cardiovascular:  +for new chest pressure which she is unsure if coming from her muscular neck discomfort. Has appt Friday. Negative for leg swelling.  Gastrointestinal:  Negative for abdominal pain, blood in stool, constipation, nausea and vomiting.  Genitourinary:  Negative for dysuria, frequency, hematuria and urgency.  Musculoskeletal:  +for bilateral neck muscular ache intermittently.  Neurological:  Negative for weakness.  Psychiatric/Behavioral:  Negative for depression. The patient does have insomnia.    Current medications, allergy , social history, past surgical history, past medical history, family history were all reviewed.  Vitals:  BP 131/64 (BP Location: Left Arm, Patient Position: Sitting)   Pulse 62   Temp 97.6 F (36.4 C) (Oral)   Resp 18   Ht 5' 7.5 (1.715 m)   Wt 193 lb (87.5 kg)   SpO2 99%   BMI 29.78 kg/m    Physical Exam Exam conducted with a chaperone present.  Constitutional:      General: Alert, oriented. She is not in acute distress. Cardiovascular:     Rate and Rhythm: Normal rate and regular rhythm.  Pulmonary:     Effort: Pulmonary effort is normal.     Breath sounds: Normal breath sounds. No wheezing or rhonchi.  Abdominal:     Palpations: Abdomen is soft.     Tenderness: There is no abdominal tenderness.  There is no right CVA tenderness or left CVA tenderness.     Hernia: No hernia is present.  Genitourinary:    General: Normal vulva without new lesions, without erythema.     Urethra: No urethral lesion.     Vagina: No lesions. No bleeding. Atrophic changes.  Musculoskeletal:     Cervical back: Neck supple.     Right lower leg: No edema.     Left lower leg: No edema.  Lymphadenopathy:     No supraclavicular adenopathy. No right inguinal adenopathy. No left inguinal adenopathy.  Skin:    Findings: No rash.  Neurological:     Mental Status: She is oriented to person, place, and time.   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  Unclear etiology of recurrent skin lesions under breast and vulva folds.  DDx-- exzematous or seborrheic dermatitis, intertrigo.   Optimize hygiene practices, avoid shaving Continue close surveillance with repeat exam in 6 months. Reportable signs and symptoms reviewed.    I personally spent 25 minutes face-to-face and non-face-to-face in the care of this patient, which includes all pre, intra, and post visit time on the date of service.    [place], MD

## 2024-01-30 NOTE — Telephone Encounter (Signed)
 Spoke with [facility] who called to state they don't carry the Ketoconazole -hydrocortisone  2 & 1 % Kit and if they could get it? It will be $365.)). Provider is aware and advised to call the patient and make her aware that she can get the over the counter Ketoconazole .   Attempted to reach patient to relay above information. Left message requesting call back.

## 2024-01-30 NOTE — Assessment & Plan Note (Addendum)
 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 .Reportable signs and symptoms reviewed.   - Can transition to annual exams   I personally spent 25 minutes face-to-face and non-face-to-face in the care of this patient, which includes all pre, intra, and post visit time on the date of service.

## 2024-02-01 ENCOUNTER — Encounter: Payer: Self-pay | Admitting: Pulmonary Disease

## 2024-02-01 ENCOUNTER — Ambulatory Visit: Admitting: Pulmonary Disease

## 2024-02-01 VITALS — BP 164/72 | HR 63 | Ht 67.5 in | Wt 193.2 lb

## 2024-02-01 DIAGNOSIS — R053 Chronic cough: Secondary | ICD-10-CM

## 2024-02-01 DIAGNOSIS — R0600 Dyspnea, unspecified: Secondary | ICD-10-CM

## 2024-02-01 DIAGNOSIS — J449 Chronic obstructive pulmonary disease, unspecified: Secondary | ICD-10-CM | POA: Diagnosis not present

## 2024-02-01 DIAGNOSIS — R1310 Dysphagia, unspecified: Secondary | ICD-10-CM

## 2024-02-01 DIAGNOSIS — Z87891 Personal history of nicotine dependence: Secondary | ICD-10-CM

## 2024-02-01 NOTE — Patient Instructions (Signed)
 No changes to medication  Consider referral to gastroenterologist or GI doctor to evaluate food getting stuck when he swallows  Return to clinic in 3 months or sooner as needed with Dr. Annella

## 2024-02-01 NOTE — Progress Notes (Signed)
 Synopsis: Referred for dyspnea by Charlott Dorn LABOR, *  Subjective:   PATIENT ID: Diane  Meri [name] GENDER: female DOB: [birth year], MRN: [id number]  Chief Complaint  Patient presents with   Follow-up    Centribular emphysema f/u Pt states she has a lot of phlegm in her throat but is unable to cough anything up and is waking up coughing in the night.    82 y.o. history of AFs/p maze, breast cancer and SCC vulva, chronic diastolic heart failure, CAD, DM, MR s/p ring annuloplasty, PH/TR, emphysema, COPD, tobacco abuse in remission.  Multiple gynecology notes reviewed.  Overall stable increased to Trelegy at last visit, Trelegy high-dose from low-dose.  Seems to have little bit.  Not productive cough.  Not really complain of shortness of breath.  Chief complaint is dry cough.  Feels like irritation of phlegm in her throat.  But never coughed anything up.  She endorses no heartburn.  We discussed GERD is likely cause of inflammation in the back of throat causing sensation of mucus or swelling but no mucus production.  On further questioning she endorses dysphagia.  Food getting stuck when she swallows.  Having to cough it up or irritating the back of throat at times.  Eventually gets through.  Discussed this is likely contributors well.  She basely has chronic laryngeal irritation inflammation swelling causing desire to clear throat and cough.  With lack of mucus involvement unlikely be related to the lungs.   HPI initial visit: New patient evaluation for me today.  Breathing stable.  Doing well.  No worsening dyspnea.  Thinks Trelegy helps.  Reports albuterol  use 2-3 times a week.  During the day.  With exertion.  CT high-resolution reviewed, no ILD, emphysema noted.  Lungs otherwise look okay.  Discussed liver lesion seen on CT scan.  Present for some time.  There are some changes.  MRI recommended.  No one discussed results with her.  MyChart message was sent but she does not have  access to MyChart.  She says no one called her.  There is documentation 1/29 that she was called.  And results were discussed.  Past Medical History:  Diagnosis Date   Anxiety    Arthritis    back   Atrial fibrillation (HCC)    Breast cancer (HCC)    right breast cancer - 3 years ago   Chronic diastolic heart failure (HCC)    ECHO: 06/20/2017 - Grade 2 Diastolic Dysfunction    Coronary artery disease    Depression    Diabetes mellitus without complication (HCC)    diet   Dysrhythmia    afib   Fibromyalgia    Herniated disc    Hyperlipemia    Hypertension    Iron deficiency anemia    Longstanding persistent atrial fibrillation (HCC)    Lower back pain    Lumbar stenosis    L4-5   Personal history of chemotherapy    Personal history of radiation therapy    Pulmonary hypertension (HCC)    S/P Maze operation for atrial fibrillation 05/09/2018   Complete bilateral atrial lesion set using bipolar radiofrequency and cryothermy ablation with clipping of LA appendage   S/P mitral valve repair 05/09/2018   26 mm Sorin Memo 4D ring annuloplasty   S/P radiation therapy 01/23/2013-03/06/2013   Right breast / 46 Gy in 23 fractions / Right breast boost / 14 Gy in 7 fractions   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   Docetaxel /Cytoxan  Q 3 Weeks x 4 cycles   Torn rotator cuff    Tricuspid regurgitation      Family History  Problem Relation Age of Onset   Stroke Mother    Alzheimer's disease Mother    Heart attack Father    Heart attack Brother    Breast cancer Neg Hx    Colon cancer Neg Hx    Ovarian cancer Neg Hx    Endometrial cancer Neg Hx    Pancreatic cancer Neg Hx    Prostate cancer Neg Hx      Past Surgical History:  Procedure Laterality Date   ABDOMINAL  HYSTERECTOMY     BREAST BIOPSY Right 09/04/2012   BREAST LUMPECTOMY Right 10/08/2012   CLIPPING OF ATRIAL APPENDAGE N/A 05/09/2018   Procedure: CLIPPING OF ATRIAL APPENDAGE USING ATRICLIP PRO2 CLIP SIZE ;  Surgeon: Dusty Sudie DEL, MD;  Location: [facility] OR;  Service: Open Heart Surgery;  Laterality: N/A;   COLONOSCOPY     DILATION AND CURETTAGE OF UTERUS     LEFT HEART CATHETERIZATION WITH CORONARY ANGIOGRAM N/A 09/14/2014   Procedure: LEFT HEART CATHETERIZATION WITH CORONARY ANGIOGRAM;  Surgeon: Rober Chroman, MD;  Location: [facility];  Service: Cardiovascular;  Laterality: N/A;   MAZE N/A 05/09/2018   Procedure: MAZE;  Surgeon: Dusty Sudie DEL, MD;  Location: [facility] OR;  Service: Open Heart Surgery;  Laterality: N/A;   MITRAL VALVE REPAIR N/A 05/09/2018   Procedure: MITRAL VALVE REPAIR (MVR) USING MEMO 4D RING SIZE ;  Surgeon: Dusty Sudie DEL, MD;  Location: [facility] OR;  Service: Open Heart Surgery;  Laterality: N/A;   OPEN REDUCTION INTERNAL FIXATION (ORIF) PROXIMAL PHALANX Right 01/04/2016   Procedure: OPEN TREATMENT OF RIGHT THUMB PROXIMAL PHALANX FRACTURE;  Surgeon: Alm Hummer, MD;  Location: [facility];  Service: Orthopedics;  Laterality: Right;   PARTIAL MASTECTOMY WITH NEEDLE LOCALIZATION AND AXILLARY SENTINEL LYMPH NODE BX Right 10/08/2012   Procedure: RIGHT PARTIAL MASTECTOMY WITH NEEDLE LOCALIZATION AND RIGHT AXILLARY SENTINEL LYMPH NODE BX;  Surgeon: Elon CHRISTELLA Pacini, MD;  Location: [facility] OR;  Service: General;  Laterality: Right;  Injection of 1% Methylene Blue  dye into right breast   PORTACATH PLACEMENT Left 10/08/2012   Procedure: INSERTION PORT-A-CATH WITH ULTRASOUND GUIDANCE;  Surgeon: Elon CHRISTELLA Pacini, MD;  Location: [facility] OR;  Service: General;  Laterality: Left;  Using 57F Powerport Clearvue   Posterior Lumbar Arthrodesis, Pedicle screw fixation, Posterolateral Arthodesis  03/17/11   RIGHT/LEFT HEART CATH AND CORONARY ANGIOGRAPHY N/A 05/02/2018   Procedure: RIGHT/LEFT  HEART CATH AND CORONARY ANGIOGRAPHY;  Surgeon: Chroman Rober, MD;  Location: [facility] INVASIVE CV LAB;  Service: Cardiovascular;  Laterality: N/A;   SHOULDER ARTHROSCOPY WITH ROTATOR CUFF REPAIR AND SUBACROMIAL DECOMPRESSION  06/04/2012   Procedure: SHOULDER ARTHROSCOPY WITH ROTATOR CUFF REPAIR AND SUBACROMIAL DECOMPRESSION;  Surgeon: Eva Elsie Herring, MD;  Location: [facility];  Service: Orthopedics;  Laterality: Left;  LEFT SHOULDER ARTHROSCOPY WITH ROTATOR CUFF REPAIR AND SUBACROMIAL DECOMPRESSION AND DISTAL CLAVICLE RESECTION   TEE WITHOUT CARDIOVERSION N/A 05/03/2018   Procedure: TRANSESOPHAGEAL ECHOCARDIOGRAM (TEE);  Surgeon: Claudene Pacific, MD;  Location: [facility] ENDOSCOPY;  Service: Cardiovascular;  Laterality: N/A;   TEE WITHOUT CARDIOVERSION N/A 05/09/2018   Procedure: TRANSESOPHAGEAL ECHOCARDIOGRAM (TEE);  Surgeon: Dusty Sudie  H, MD;  Location: [facility] OR;  Service: Open Heart Surgery;  Laterality: N/A;   TONSILLECTOMY     TRICUSPID VALVE REPLACEMENT N/A 05/09/2018   Procedure: TRICUSPID VALVE REPAIR USING MC3 RING SIZE ;  Surgeon: Dusty Sudie DEL, MD;  Location: [facility] OR;  Service: Open Heart Surgery;  Laterality: N/A;   TUBAL LIGATION     VULVECTOMY Left 02/06/2019   Procedure: POSTERIOR RADICAL VULVECTOMY;  Surgeon: Eloy Herring, MD;  Location: [facility] ORS;  Service: Gynecology;  Laterality: Left;   VULVECTOMY PARTIAL     Wide Local Excision  01/2010   Bilateral groin excisions, re-excision of vulva    Social History   Socioeconomic History   Marital status: Widowed    Spouse name: Not on file   Number of children: 3   Years of education: Not on file   Highest education level: Not on file  Occupational History   Occupation: retired  Tobacco Use   Smoking status: Former    Current packs/day: 0.00    Average packs/day: 1 pack/day for 50.0 years (50.0 ttl pk-yrs)    Types: Cigarettes    Start date: 05/30/1959    Quit date: 05/29/2009    Years since quitting: 14.6    Smokeless tobacco: Never  Vaping Use   Vaping status: Never Used  Substance and Sexual Activity   Alcohol use: Yes    Comment: rare   Drug use: No   Sexual activity: Not Currently  Other Topics Concern   Not on file  Social History Narrative   Tobacco use cigarettes: former smoker, quit in year Sept 2011, Pack-year Hx: 40, tobacco history last updated 10/10/2013. No smoking. Per patient stopped smoking 9 months ago Caffeine.: yes, 2+ servings daily, tea. No recreational drug use. Exercise: [employer] water aer[employer]ics (wants to start going again). Marital status: widow.         3 children, 5 grand and 4 great grands, all live in [place]      Likes to do word searches, read, watch news, daughter passed away last year      Lives in senior citizen community, neighbor keeps an eye on her   Social Drivers of Health   Financial Resource Strain: Not on file  Food Insecurity: No Food Insecurity (01/29/2024)   Hunger Vital Sign    Worried About Running Out of Food in the Last Year: Never true    Ran Out of Food in the Last Year: Never true  Transportation Needs: No Transportation Needs (01/29/2024)   PRAPARE - Administrator, Civil Service (Medical): No    Lack of Transportation (Non-Medical): No  Physical Activity: Not on file  Stress: Not on file  Social Connections: Not on file  Intimate Partner Violence: Not At Risk (01/29/2024)   Humiliation, Afraid, Rape, and Kick questionnaire    Fear of Current or Ex-Partner: No    Emotionally Abused: No    Physically Abused: No    Sexually Abused: No     Allergies  Allergen Reactions   Shellfish Allergy  Anaphylaxis and Swelling    Other reaction(s): Other (See Comments)   Atorvastatin  Calcium      Other Reaction(s): muscle pain   Rosuvastatin Calcium  Other (See Comments)    Other Reaction(s): myalgia   Shellfish-Derived Products Other (See Comments) and Swelling     Outpatient Medications Prior to Visit  Medication Sig Dispense Refill    acetaminophen  (TYLENOL ) 500 MG tablet Take 500 mg by mouth every 6 (six) hours as  needed (for pain.).     albuterol  (VENTOLIN  HFA) 108 (90 Base) MCG/ACT inhaler Inhale 2 puffs into the lungs every 6 (six) hours as needed for wheezing or shortness of breath. 1 each 3   amiodarone  (PACERONE ) 200 MG tablet Take 100 mg by mouth every other day.     busPIRone (BUSPAR) 5 MG tablet Take 5 mg by mouth 2 (two) times daily.     co-enzyme Q-10 30 MG capsule Take 30 mg by mouth daily.     ELIQUIS  2.5 MG TABS tablet Take 2.5 mg by mouth 2 (two) times daily.     ferrous sulfate 325 (65 FE) MG EC tablet Take 325 mg by mouth every other day.     Fluticasone -Umeclidin-Vilant (TRELEGY ELLIPTA ) 200-62.5-25 MCG/ACT AEPB Inhale 1 puff into the lungs daily. 60 each 11   furosemide  (LASIX ) 40 MG tablet Take 1 tablet (40 mg total) by mouth daily. 30 tablet 1   Ketoconazole -Hydrocortisone  2 & 1 % KIT Apply 1 Application topically daily as needed for up to 14 days. 67.7 g 0   Lancets (ONETOUCH DELICA PLUS LANCET33G) MISC USE AS DIRECTED DAILY FOR 30 DAYS     loratadine  (CLARITIN ) 10 MG tablet Take 10 mg by mouth daily as needed for allergies.     melatonin 5 MG TABS 1-2 tablet in the evening at bedtime Orally Once a day; Duration: 30 days     meloxicam  (MOBIC ) 15 MG tablet Take 1 tablet (15 mg total) by mouth daily. 30 tablet 0   Menthol, Topical Analgesic, (BLUE-EMU MAXIMUM STRENGTH EX) Apply 1 Application topically daily as needed.     Multiple Vitamins-Minerals (CENTRUM SILVER 50+WOMEN) TABS Take 1 tablet by mouth daily.     nitroGLYCERIN  (NITROSTAT ) 0.4 MG SL tablet Place 0.4 mg under the tongue every 5 (five) minutes x 3 doses as needed for chest pain.     ONETOUCH VERIO test strip FOR USE WHEN CHECKING BLOOD GLUCOSE ONCE DAILY ALTERNATING AM AND PM BEFORE MEALS FINGER STICK 90 DAYS  3   pantoprazole  (PROTONIX ) 40 MG tablet Take 1 tablet (40 mg total) by mouth daily. 90 tablet 3   potassium chloride  (KLOR-CON ) 10 MEQ  tablet Take 10 mEq by mouth daily.     pravastatin  (PRAVACHOL ) 10 MG tablet Take 10 mg by mouth at bedtime.     spironolactone  (ALDACTONE ) 25 MG tablet Take 12.5 mg by mouth every morning.     amoxicillin (AMOXIL) 500 MG capsule SMARTSIG:4 Capsule(s) By Mouth Once     potassium chloride  (KLOR-CON  M) 10 MEQ tablet Take 1 tablet (10 mEq total) by mouth daily. 30 tablet 2   No facility-administered medications prior to visit.       Objective:   Physical Exam:  General appearance: Elderly and robust, NAD, conversant  Eyes: anicteric sclerae; PERRL, tracking appropriately HENT: NCAT; MMM, hoarse Neck: Trachea midline; no lymphadenopathy, no JVD Lungs: CTAB, no crackles, no wheeze, with normal respiratory effort CV: RRR, no murmur  Abdomen: Soft, non-tender; non-distended, BS present  Extremities: No peripheral edema, warm Skin: Normal turgor and texture; no rash Psych: Appropriate affect Neuro: Alert and oriented to person and place, no focal deficit   Vitals:   02/01/24 0910  BP: (!) 164/72  Pulse: 63  SpO2: 98%  Weight: 193 lb 3.2 oz (87.6 kg)  Height: 5' 7.5 (1.715 m)    98% on RA BMI Readings from Last 3 Encounters:  02/01/24 29.81 kg/m  01/30/24 29.78 kg/m  08/01/23 31.03 kg/m  Wt Readings from Last 3 Encounters:  02/01/24 193 lb 3.2 oz (87.6 kg)  01/30/24 193 lb (87.5 kg)  08/01/23 201 lb 1.6 oz (91.2 kg)     CBC    Component Value Date/Time   WBC 7.7 04/19/2023 0531   RBC 4.10 04/19/2023 0531   HGB 11.2 (L) 04/19/2023 0531   HGB 10.6 (L) 01/11/2015 1054   HCT 35.4 (L) 04/19/2023 0531   HCT 33.1 (L) 01/11/2015 1054   PLT 163 04/19/2023 0531   PLT 207 01/11/2015 1054   MCV 86.3 04/19/2023 0531   MCV 84.1 01/11/2015 1054   MCH 27.3 04/19/2023 0531   MCHC 31.6 04/19/2023 0531   RDW 15.4 04/19/2023 0531   RDW 16.0 (H) 01/11/2015 1054   LYMPHSABS 2.2 04/19/2022 1328   LYMPHSABS 1.4 01/11/2015 1054   MONOABS 0.6 04/19/2022 1328   MONOABS 0.5  01/11/2015 1054   EOSABS 0.1 04/19/2022 1328   EOSABS 0.1 01/11/2015 1054   BASOSABS 0.0 04/19/2022 1328   BASOSABS 0.1 01/11/2015 1054    Chest Imaging: CXR 02/19/21 reviewed by me with prominent vasculature, cardiomegaly  CTA Dissection protocol 05/06/18 reviewed by me with mosaic attenuation, cardiomegaly, small stable right effusion  Pulmonary Functions Testing Results:    Latest Ref Rng & Units 10/02/2022    2:18 PM 06/14/2022   10:47 AM 02/06/2018   11:44 AM  PFT Results  FVC-Pre L 1.62  1.35  1.26   FVC-Predicted Pre % 53  46  51   FVC-Post L  1.43  1.32   FVC-Predicted Post %  49  54   Pre FEV1/FVC % % 82  83  83   Post FEV1/FCV % %  82  87   FEV1-Pre L 1.33  1.12  1.05   FEV1-Predicted Pre % 58  51  55   FEV1-Post L  1.17  1.15   DLCO uncorrected ml/min/mmHg 10.08  8.94  11.02   DLCO UNC% % 48  44  40   DLCO corrected ml/min/mmHg 10.08  8.94  11.96   DLCO COR %Predicted % 48  44  44   DLVA Predicted % 72  85  77   TLC L   4.11   TLC % Predicted %   76   RV % Predicted %   95    PFT 06/14/22 stable  PFT 2019 with Mixed moderate obstruction (FEV1/SVC 57, FEV1 post BD 61) and restriction, severely reduced diffusing capacity    Echocardiogram:   TTE 05/16/18:  - Left ventricle: The cavity size was normal. Systolic function was    normal. The estimated ejection fraction was in the range of 55%    to 60%. Wall motion was normal; there were no regional wall    motion abnormalities. Doppler parameters are consistent with    abnormal left ventricular relaxation (grade 1 diastolic    dysfunction).  - Aortic valve: There was mild to moderate regurgitation. Valve    area (VTI): 2.11 cm^2. Valve area (Vmax): 1.99 cm^2. Valve area    (Vmean): 2.15 cm^2.  - Mitral valve: Valve area by continuity equation (using LVOT    flow): 1.91 cm^2.  - Left atrium: The atrium was mildly dilated.  - Atrial septum: No defect or patent foramen ovale was identified.  - Pulmonic valve:  Peak gradient (S): 12 mm Hg.     Assessment & Plan:   # Dyspnea # COPD gold functional group B # Dysphagia #Chronic cough  Stable on Trelegy.  Ongoing cough.  Dry.  Likely reflux versus inflammation irritation related to what sounds like esophageal dysphagia.  Food getting stuck.  Plan: - continue protonix  40 mg 30 minutes before dinner EVERY night - continue trelegy 1 puff once daily rinse mouth and gargle after use - Recommend GI referral for dysphagia as I think the dry cough is related to recurrent laryngeal inflammation related to GERD and/or food getting stuck etc., she declines today will contact me when she is ready   RTC 3 months or sooner as needed with Dr. Annella Donnice JONELLE Annella, MD [facility] 02/01/2024 9:30 AM

## 2024-02-04 DIAGNOSIS — E785 Hyperlipidemia, unspecified: Secondary | ICD-10-CM | POA: Diagnosis not present

## 2024-02-04 DIAGNOSIS — I10 Essential (primary) hypertension: Secondary | ICD-10-CM | POA: Diagnosis not present

## 2024-02-04 DIAGNOSIS — N184 Chronic kidney disease, stage 4 (severe): Secondary | ICD-10-CM | POA: Diagnosis not present

## 2024-02-05 ENCOUNTER — Telehealth (HOSPITAL_BASED_OUTPATIENT_CLINIC_OR_DEPARTMENT_OTHER): Payer: Self-pay

## 2024-02-05 NOTE — Telephone Encounter (Unsigned)
 Copied from CRM [id number]. Topic: Clinical - Medical Advice >> Feb 04, 2024  4:10 PM Isabell A wrote: Reason for CRM: Patient states she was told by Dr.Hunsucker to call back if she needs to get her COVID shot. Patient is requesting to have this completed at   [id number] - [place] ([place]), [place] - [address], [place] ([place])  [address] Phone: [phone]  Fax: [phone]  Requesting a call back once sent.   Callback number: [phone]

## 2024-02-06 NOTE — Telephone Encounter (Signed)
 Okay for Pfizer vaccine.  To my knowledge this may not be in his stock.  Okay to print prescription for this she will need to pick this up.  They will not accept e-prescribing at this time to my knowledge.

## 2024-02-08 ENCOUNTER — Other Ambulatory Visit: Payer: Self-pay

## 2024-02-08 MED ORDER — COVID-19 MRNA VAC-TRIS(PFIZER) 30 MCG/0.3ML IM SUSY: 0.3000 mL | PREFILLED_SYRINGE | Freq: Once | INTRAMUSCULAR | 0 refills | Status: AC

## 2024-02-08 NOTE — Telephone Encounter (Signed)
 Order printed,has been picked up [facility]

## 2024-02-11 ENCOUNTER — Telehealth: Payer: Self-pay

## 2024-02-11 NOTE — Telephone Encounter (Signed)
 Copied from CRM [id number]. Topic: General - Other >> Feb 08, 2024  9:36 AM Shona S wrote: Reason for CRM: patient calling for update on prescription request for covid shot. Patient said she havent heard anything back since Monday. Gave her the notes from doctor. Patient is coming by today to pick up prescription.   Tried to reach out to patient    On Friday, the [place]  Department of Health and Human Services ([place]) issued a new Standing Order that allows eligible adults, 18 years and up, in [place]  to receive their COVID-19 vaccine at pharmacies statewide without the need of a prescription, if they meet FDA criteria. He will meet criteria as he is over 15 years old so no need for a prescription now. There was a brief period where they were requiring rx but that has since been updated. Thanks.

## 2024-02-25 DIAGNOSIS — N184 Chronic kidney disease, stage 4 (severe): Secondary | ICD-10-CM | POA: Diagnosis not present

## 2024-03-06 DIAGNOSIS — E1122 Type 2 diabetes mellitus with diabetic chronic kidney disease: Secondary | ICD-10-CM | POA: Diagnosis not present

## 2024-03-06 DIAGNOSIS — D631 Anemia in chronic kidney disease: Secondary | ICD-10-CM | POA: Diagnosis not present

## 2024-03-06 DIAGNOSIS — I129 Hypertensive chronic kidney disease with stage 1 through stage 4 chronic kidney disease, or unspecified chronic kidney disease: Secondary | ICD-10-CM | POA: Diagnosis not present

## 2024-03-06 DIAGNOSIS — N2581 Secondary hyperparathyroidism of renal origin: Secondary | ICD-10-CM | POA: Diagnosis not present

## 2024-03-06 DIAGNOSIS — N184 Chronic kidney disease, stage 4 (severe): Secondary | ICD-10-CM | POA: Diagnosis not present

## 2024-03-12 ENCOUNTER — Encounter (HOSPITAL_COMMUNITY): Payer: Self-pay | Admitting: Emergency Medicine

## 2024-03-12 ENCOUNTER — Telehealth (HOSPITAL_COMMUNITY): Payer: Self-pay | Admitting: Family Medicine

## 2024-03-12 ENCOUNTER — Ambulatory Visit (HOSPITAL_COMMUNITY)
Admission: EM | Admit: 2024-03-12 | Discharge: 2024-03-12 | Disposition: A | Discharge status: Home / Self Care | Attending: Family Medicine | Admitting: Family Medicine

## 2024-03-12 ENCOUNTER — Other Ambulatory Visit: Payer: Self-pay

## 2024-03-12 DIAGNOSIS — G4452 New daily persistent headache (NDPH): Secondary | ICD-10-CM | POA: Diagnosis not present

## 2024-03-12 MED ORDER — PREDNISONE 20 MG PO TABS: 60.0000 mg | ORAL_TABLET | Freq: Every day | ORAL | 0 refills | Status: DC

## 2024-03-12 NOTE — Telephone Encounter (Signed)
 Phone visit in error

## 2024-03-12 NOTE — Discharge Instructions (Signed)
 You were seen today for headache to the right temple.  As discussed, I am concerned for inflammation of the artery.  I have drawn blood work and sent out a medication to your pharmacy.  You will be notified if there is anything concerning on the blood work, and if a referral for biopsy will be needed.

## 2024-03-12 NOTE — ED Provider Notes (Signed)
 [facility]    CSN: [id number] Arrival date & time: 03/12/24  1119      History   Chief Complaint No chief complaint on file.   HPI Diane  Meri [name] is a 83 y.o. female.   Patient is here for pain at her right temple, that goes down her head into the right shoulder.   This has been going on for about 1 week.  It is constant.  Pain is 8-9/10.   No blurry vision.  She has been taking claritin /tylenol  without help.  She is cold, but otherwise no fevers/chills.   She did have dental work on the right side prior to this starting.  She has not been chewing on that side as a result.  Does not think chewing makes this worse.        Past Medical History:  Diagnosis Date   Anxiety    Arthritis    back   Atrial fibrillation (HCC)    Breast cancer (HCC)    right breast cancer - 3 years ago   Chronic diastolic heart failure (HCC)    ECHO: 06/20/2017 - Grade 2 Diastolic Dysfunction    Coronary artery disease    Depression    Diabetes mellitus without complication (HCC)    diet   Dysrhythmia    afib   Fibromyalgia    Herniated disc    Hyperlipemia    Hypertension    Iron deficiency anemia    Longstanding persistent atrial fibrillation (HCC)    Lower back pain    Lumbar stenosis    L4-5   Personal history of chemotherapy    Personal history of radiation therapy    Pulmonary hypertension (HCC)    S/P Maze operation for atrial fibrillation 05/09/2018   Complete bilateral atrial lesion set using bipolar radiofrequency and cryothermy ablation with clipping of LA appendage   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   Swelling of limb 01/23/2013   Oropharyngeal candidiasis 01/13/2013   Chemotherapy induced neutropenia 01/13/2013   UTI (urinary tract infection) 12/25/2012   Cancer of central portion of right female breast (HCC) 09/06/2012   Vulva cancer (HCC) 08/16/2011   INSOMNIA 08/26/2010   Headache 08/26/2010   TOBACCO USER 11/04/2009   Asymptomatic postmenopausal status 11/04/2009   DYSLIPIDEMIA 07/30/2009   Alopecia 07/30/2009   ANEMIA 07/26/2009   SINUSITIS, ACUTE 04/02/2009   DIABETES MELLITUS, TYPE II 01/01/2009   URINARY TRACT INFECTION SITE NOT SPECIFIED 11/26/2008   BREAST PAIN, RIGHT 11/12/2008   Angioedema 10/05/2008   CHEST PAIN, ATYPICAL 09/01/2008   Allergic rhinitis 05/05/2008   Vaginitis and  vulvovaginitis 05/05/2008   CAROTID BRUIT, RIGHT 11/12/2007   SHOULDER PAIN, LEFT, CHRONIC 08/09/2007   DEGENERATIVE DISC DISEASE, 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   DEGENERATIVE DISC DISEASE, LUMBOSACRAL SPINE 08/09/2007   HYPERTENSION, BENIGN ESSENTIAL 03/20/2007   BRONCHITIS, ACUTE 03/20/2007   DEPRESSION 02/27/2007   MURMUR 02/27/2007    Past Surgical History:  Procedure Laterality Date   ABDOMINAL HYSTERECTOMY     BREAST BIOPSY Right 09/04/2012   BREAST LUMPECTOMY Right 10/08/2012   CLIPPING OF ATRIAL APPENDAGE N/A 05/09/2018   Procedure: CLIPPING OF ATRIAL APPENDAGE USING ATRICLIP PRO2 CLIP SIZE ;  Surgeon: Dusty Sudie DEL, MD;  Location: [facility] OR;  Service: Open Heart Surgery;  Laterality: N/A;   COLONOSCOPY     DILATION AND CURETTAGE OF UTERUS     LEFT HEART CATHETERIZATION WITH CORONARY ANGIOGRAM N/A 09/14/2014   Procedure: LEFT HEART CATHETERIZATION WITH CORONARY ANGIOGRAM;  Surgeon: Rober Chroman, MD;  Location: [facility] CATH LAB;  Service: Cardiovascular;  Laterality: N/A;   MAZE N/A 05/09/2018   Procedure: MAZE;  Surgeon: Dusty Sudie DEL, MD;  Location: [facility] OR;  Service: Open Heart Surgery;  Laterality: N/A;   MITRAL VALVE REPAIR N/A 05/09/2018   Procedure: MITRAL VALVE REPAIR (MVR) USING MEMO 4D RING SIZE ;  Surgeon: Dusty Sudie DEL, MD;  Location: [facility] OR;  Service: Open Heart Surgery;  Laterality: N/A;   OPEN REDUCTION INTERNAL FIXATION (ORIF) PROXIMAL PHALANX Right 01/04/2016   Procedure: OPEN TREATMENT OF RIGHT THUMB PROXIMAL PHALANX FRACTURE;  Surgeon: Alm Hummer, MD;  Location: [facility];  Service: Orthopedics;  Laterality: Right;   PARTIAL MASTECTOMY WITH NEEDLE LOCALIZATION AND AXILLARY SENTINEL LYMPH NODE BX Right 10/08/2012   Procedure: RIGHT PARTIAL MASTECTOMY WITH NEEDLE LOCALIZATION AND RIGHT AXILLARY SENTINEL LYMPH NODE BX;  Surgeon: Elon CHRISTELLA Pacini, MD;  Location: [facility] OR;  Service: General;  Laterality: Right;  Injection of 1% Methylene Blue  dye into  right breast   PORTACATH PLACEMENT Left 10/08/2012   Procedure: INSERTION PORT-A-CATH WITH ULTRASOUND GUIDANCE;  Surgeon: Elon CHRISTELLA Pacini, MD;  Location: [facility] OR;  Service: General;  Laterality: Left;  Using 73F Powerport Clearvue   Posterior Lumbar Arthrodesis, Pedicle screw fixation, Posterolateral Arthodesis  03/17/11   RIGHT/LEFT HEART CATH AND CORONARY ANGIOGRAPHY N/A 05/02/2018   Procedure: RIGHT/LEFT HEART CATH AND CORONARY ANGIOGRAPHY;  Surgeon: Chroman Rober, MD;  Location: [facility];  Service: Cardiovascular;  Laterality: N/A;   SHOULDER ARTHROSCOPY WITH ROTATOR CUFF REPAIR AND SUBACROMIAL DECOMPRESSION  06/04/2012   Procedure: SHOULDER ARTHROSCOPY WITH ROTATOR CUFF REPAIR AND SUBACROMIAL DECOMPRESSION;  Surgeon: Eva Elsie Herring, MD;  Location: [facility];  Service: Orthopedics;  Laterality: Left;  LEFT SHOULDER ARTHROSCOPY WITH ROTATOR CUFF REPAIR AND SUBACROMIAL DECOMPRESSION AND DISTAL CLAVICLE RESECTION   TEE WITHOUT CARDIOVERSION N/A  05/03/2018   Procedure: TRANSESOPHAGEAL ECHOCARDIOGRAM (TEE);  Surgeon: Claudene Pacific, MD;  Location: [facility] ENDOSCOPY;  Service: Cardiovascular;  Laterality: N/A;   TEE WITHOUT CARDIOVERSION N/A 05/09/2018   Procedure: TRANSESOPHAGEAL ECHOCARDIOGRAM (TEE);  Surgeon: Dusty Sudie DEL, MD;  Location: [facility] OR;  Service: Open Heart Surgery;  Laterality: N/A;   TONSILLECTOMY     TRICUSPID VALVE REPLACEMENT N/A 05/09/2018   Procedure: TRICUSPID VALVE REPAIR USING MC3 RING SIZE ;  Surgeon: Dusty Sudie DEL, MD;  Location: [facility] OR;  Service: Open Heart Surgery;  Laterality: N/A;   TUBAL LIGATION     VULVECTOMY Left 02/06/2019   Procedure: POSTERIOR RADICAL VULVECTOMY;  Surgeon: Eloy Herring, MD;  Location: [facility] ORS;  Service: Gynecology;  Laterality: Left;   VULVECTOMY PARTIAL     Wide Local Excision  01/2010   Bilateral groin excisions, re-excision of vulva    OB History   No obstetric history on file.      Home Medications     Prior to Admission medications   Medication Sig Start Date End Date Taking? Authorizing Provider  acetaminophen  (TYLENOL ) 500 MG tablet Take 500 mg by mouth every 6 (six) hours as needed (for pain.).    [provider]  albuterol  (VENTOLIN  HFA) 108 (90 Base) MCG/ACT inhaler Inhale 2 puffs into the lungs every 6 (six) hours as needed for wheezing or shortness of breath. 04/10/23   Parrett, Madelin RAMAN, NP  amiodarone  (PACERONE ) 200 MG tablet Take 100 mg by mouth every other day. 07/06/20   [provider]  busPIRone (BUSPAR) 5 MG tablet Take 5 mg by mouth 2 (two) times daily. 07/07/21   [provider]  co-enzyme Q-10 30 MG capsule Take 30 mg by mouth daily.    [provider]  ELIQUIS  2.5 MG TABS tablet Take 2.5 mg by mouth 2 (two) times daily. 02/21/19   [provider]  ferrous sulfate 325 (65 FE) MG EC tablet Take 325 mg by mouth every other day. 11/05/19   [provider]  Fluticasone -Umeclidin-Vilant (TRELEGY ELLIPTA ) 200-62.5-25 MCG/ACT AEPB Inhale 1 puff into the lungs daily. 07/11/23   Hunsucker, Donnice SAUNDERS, MD  furosemide  (LASIX ) 40 MG tablet Take 1 tablet (40 mg total) by mouth daily. 05/20/18   Gold, Wayne E, PA-C  Lancets (ONETOUCH DELICA PLUS LANCET33G) MISC USE AS DIRECTED DAILY FOR 30 DAYS 12/09/18   [provider]  loratadine  (CLARITIN ) 10 MG tablet Take 10 mg by mouth daily as needed for allergies.    [provider]  melatonin 5 MG TABS 1-2 tablet in the evening at bedtime Orally Once a day; Duration: 30 days 08/03/23   [provider]  meloxicam  (MOBIC ) 15 MG tablet Take 1 tablet (15 mg total) by mouth daily. 06/28/23   Tobie Franky SQUIBB, DPM  Menthol, Topical Analgesic, (BLUE-EMU MAXIMUM STRENGTH EX) Apply 1 Application topically daily as needed.    [provider]  Multiple Vitamins-Minerals (CENTRUM SILVER 50+WOMEN) TABS Take 1 tablet by mouth daily.    [provider]  nitroGLYCERIN  (NITROSTAT )  0.4 MG SL tablet Place 0.4 mg under the tongue every 5 (five) minutes x 3 doses as needed for chest pain. 12/10/18   [provider]  ONETOUCH VERIO test strip FOR USE WHEN CHECKING BLOOD GLUCOSE ONCE DAILY ALTERNATING AM AND PM BEFORE MEALS FINGER STICK 90 DAYS 01/20/18   [provider]  pantoprazole  (PROTONIX ) 40 MG tablet Take 1 tablet (40 mg total) by mouth daily. 11/13/23   Hunsucker, Donnice  R, MD  potassium chloride  (KLOR-CON ) 10 MEQ tablet Take 10 mEq by mouth daily. 10/14/23   [provider]  pravastatin  (PRAVACHOL ) 10 MG tablet Take 10 mg by mouth at bedtime. 10/19/19   [provider]  spironolactone  (ALDACTONE ) 25 MG tablet Take 12.5 mg by mouth every morning. 11/25/20   [provider]    Family History Family History  Problem Relation Age of Onset   Stroke Mother    Alzheimer's disease Mother    Heart attack Father    Heart attack Brother    Breast cancer Neg Hx    Colon cancer Neg Hx    Ovarian cancer Neg Hx    Endometrial cancer Neg Hx    Pancreatic cancer Neg Hx    Prostate cancer Neg Hx     Social History Social History   Tobacco Use   Smoking status: Former    Current packs/day: 0.00    Average packs/day: 1 pack/day for 50.0 years (50.0 ttl pk-yrs)    Types: Cigarettes    Start date: 05/30/1959    Quit date: 05/29/2009    Years since quitting: 14.7   Smokeless tobacco: Never  Vaping Use   Vaping status: Never Used  Substance Use Topics   Alcohol use: Yes    Comment: rare   Drug use: No     Allergies   Shellfish allergy , Atorvastatin  calcium , Rosuvastatin calcium , and Shellfish protein-containing drug products   Review of Systems Review of Systems  Constitutional:  Positive for chills.  HENT: Negative.    Eyes: Negative.   Respiratory: Negative.    Cardiovascular: Negative.   Gastrointestinal: Negative.   Genitourinary: Negative.   Musculoskeletal: Negative.   Neurological:  Positive for headaches.  Negative for dizziness.     Physical Exam Triage Vital Signs ED Triage Vitals  Encounter Vitals Group     BP 03/12/24 1259 (!) 164/61     Girls Systolic BP Percentile --      Girls Diastolic BP Percentile --      Boys Systolic BP Percentile --      Boys Diastolic BP Percentile --      Pulse Rate 03/12/24 1259 62     Resp 03/12/24 1259 20     Temp 03/12/24 1259 97.6 F (36.4 C)     Temp src --      SpO2 03/12/24 1259 97 %     Weight --      Height --      Head Circumference --      Peak Flow --      Pain Score 03/12/24 1255 9     Pain Loc --      Pain Education --      Exclude from Growth Chart --    No data found.  Updated Vital Signs BP (!) 164/61 (BP Location: Left Arm) Comment (BP Location): large cuff  Pulse 62   Temp 97.6 F (36.4 C)   Resp 20   SpO2 97%   Visual Acuity Right Eye Distance:   Left Eye Distance:   Bilateral Distance:    Right Eye Near:   Left Eye Near:    Bilateral Near:     Physical Exam Constitutional:      General: She is not in acute distress.    Appearance: Normal appearance. She is normal weight. She is not ill-appearing or toxic-appearing.  HENT:     Head:     Comments: She has point tenderness over the  right temporal artery;  she has TTP to the right forehead, down the jaw, into the neck;  no increased pain with opening/closing her jaw Cardiovascular:     Rate and Rhythm: Normal rate and regular rhythm.  Pulmonary:     Effort: Pulmonary effort is normal.     Breath sounds: Normal breath sounds.  Skin:    General: Skin is warm and dry.  Neurological:     General: No focal deficit present.     Mental Status: She is alert and oriented to person, place, and time.     Sensory: No sensory deficit.     Motor: No weakness.     Gait: Gait normal.  Psychiatric:        Mood and Affect: Mood normal.        Behavior: Behavior normal.      UC Treatments / Results  Labs (all labs ordered are listed, but only abnormal results are  displayed) Labs Reviewed  CBC WITH DIFFERENTIAL/PLATELET  SEDIMENTATION RATE  C-REACTIVE PROTEIN    EKG   Radiology No results found.  Procedures Procedures (including critical care time)  Medications Ordered in UC Medications - No data to display  Initial Impression / Assessment and Plan / UC Course  I have reviewed the triage vital signs and the nursing notes.  Pertinent labs & imaging results that were available during my care of the patient were reviewed by me and considered in my medical decision making (see chart for details).   Patient is seen today for right temporal pain/headache.  Concern for temporal arteritis.  Will check blood work today and start 60mg  prednisone  daily.  If the lab work (crp/esr) are elevated, then would put urgent referral in for Vascular surgery.    Nurses we unable to get a stick on the patient today for blood draw.  She admitted to only have some tea today.  Recommend she return tomorrow for blood draw after being well hydrated.  She has an apt with her PCP on Friday as well.  However, I would prefer she return asap for blood work as this will decide next course of action for treatment.  Concern for temporal arteritis.   Final Clinical Impressions(s) / UC Diagnoses   Final diagnoses:  New daily persistent headache     Discharge Instructions      You were seen today for headache to the right temple.  As discussed, I am concerned for inflammation of the artery.  I have drawn blood work and sent out a medication to your pharmacy.  You will be notified if there is anything concerning on the blood work, and if a referral for biopsy will be needed.     ED Prescriptions     Medication Sig Dispense Auth. Provider   predniSONE  (DELTASONE ) 20 MG tablet Take 3 tablets (60 mg total) by mouth daily for 5 days. 15 tablet Darral Longs, MD      PDMP not reviewed this encounter.   Darral Longs, MD 03/14/24 [phone]

## 2024-03-12 NOTE — ED Notes (Signed)
 Unable to obtain patient blood work. Provider notified. Patient should follow up with PCP or come back tomorrow for repeat blood work.

## 2024-03-12 NOTE — ED Triage Notes (Addendum)
 Reports pain in right side of head, then right neck and now into top of shoulder.  Patient is using patch on right shoulder.  This started one week ago.  Patient thought initially related to a tooth issue, then thought to be related to sinus.  Patient has tried tylenol  and Claritin .  Patient did have a crown placed last week.  Pain in forehead .    Not the worst headache, but the longest lasting headache

## 2024-03-14 ENCOUNTER — Telehealth (HOSPITAL_COMMUNITY): Payer: Self-pay | Admitting: Family Medicine

## 2024-03-14 DIAGNOSIS — N184 Chronic kidney disease, stage 4 (severe): Secondary | ICD-10-CM | POA: Diagnosis not present

## 2024-03-14 DIAGNOSIS — I10 Essential (primary) hypertension: Secondary | ICD-10-CM | POA: Diagnosis not present

## 2024-03-14 DIAGNOSIS — I340 Nonrheumatic mitral (valve) insufficiency: Secondary | ICD-10-CM | POA: Diagnosis not present

## 2024-03-14 DIAGNOSIS — E1122 Type 2 diabetes mellitus with diabetic chronic kidney disease: Secondary | ICD-10-CM | POA: Diagnosis not present

## 2024-03-14 MED ORDER — PREDNISONE 20 MG PO TABS: 60.0000 mg | ORAL_TABLET | Freq: Every day | ORAL | 0 refills | Status: AC

## 2024-03-14 NOTE — Telephone Encounter (Signed)
 I called patient in regards to blood work.  We were not able to obtain blood work in the UC on Wednesday.  She was advised to return the next day to have the labs drawn, but she did not return.  I called her today.  She has an appointment with her PCP on Monday for further discussion and blood blood work.  Advised that I only gave her a 5 days supply of predinsone, and she may need more depending on blood work. She will run out on Sunday.  I will send several more days worth to the pharmacy to keep her from running out until she can discuss with her pcp.  She states she woke up today with another bad headache.  No n/v.  No vision changes.  Advised that if her symptoms worsen to go to the ER for further evaluation.

## 2024-03-17 DIAGNOSIS — R519 Headache, unspecified: Secondary | ICD-10-CM | POA: Diagnosis not present

## 2024-03-18 ENCOUNTER — Other Ambulatory Visit (HOSPITAL_COMMUNITY): Payer: Self-pay | Admitting: Internal Medicine

## 2024-03-18 DIAGNOSIS — R519 Headache, unspecified: Secondary | ICD-10-CM

## 2024-03-25 ENCOUNTER — Ambulatory Visit (HOSPITAL_COMMUNITY)
Admission: RE | Admit: 2024-03-25 | Discharge: 2024-03-25 | Disposition: A | Discharge status: Home / Self Care | Source: Ambulatory Visit | Attending: Internal Medicine | Admitting: Internal Medicine

## 2024-03-25 DIAGNOSIS — R519 Headache, unspecified: Secondary | ICD-10-CM | POA: Diagnosis not present

## 2024-03-25 MED ORDER — GADOBUTROL 1 MMOL/ML IV SOLN
8.0000 mL | Freq: Once | INTRAVENOUS | Status: AC | PRN
Start: 2024-03-25 — End: 2024-03-25
  Administered 2024-03-25: 8 mL via INTRAVENOUS

## 2024-03-26 ENCOUNTER — Ambulatory Visit: Admitting: Podiatry

## 2024-04-15 ENCOUNTER — Ambulatory Visit (INDEPENDENT_AMBULATORY_CARE_PROVIDER_SITE_OTHER): Admitting: Podiatry

## 2024-04-15 ENCOUNTER — Encounter: Payer: Self-pay | Admitting: Podiatry

## 2024-04-15 DIAGNOSIS — E1140 Type 2 diabetes mellitus with diabetic neuropathy, unspecified: Secondary | ICD-10-CM | POA: Diagnosis not present

## 2024-04-15 DIAGNOSIS — B351 Tinea unguium: Secondary | ICD-10-CM | POA: Diagnosis not present

## 2024-04-15 DIAGNOSIS — M79676 Pain in unspecified toe(s): Secondary | ICD-10-CM

## 2024-04-15 NOTE — Progress Notes (Addendum)
 This patient returns to my office for at risk foot care.  This patient requires this care by a professional since this patient will be at risk due to having chronic kidney disease and diabetes.    This patient is unable to cut nails herself since the patient cannot reach her  nails.These nails are painful walking and wearing shoes.    This patient presents for at risk foot care today.   General Appearance  Alert, conversant and in no acute stress.  Vascular  Dorsalis pedis and .  Posterior tibial pulses  are weakly /absent palpable.Capillary return is within normal limits  bilaterally. Temperature is within normal limits  bilaterally.  Neurologic  Senn-Weinstein monofilament wire test diminished   bilaterally. Muscle power within normal limits bilaterally.  Nails Thick disfigured discolored nails with subungual debris  from hallux to fifth toes bilaterally. No evidence of bacterial infection or drainage bilaterally.  Orthopedic  No limitations of motion  feet .  No crepitus or effusions noted.  No bony pathology or digital deformities noted.  HAV  B/L.    Skin  normotropic skin with no porokeratosis noted bilaterally.  No signs of infections or ulcers noted.     Onychomycosis  Pain in right toes  Pain in left toes  Consent was obtained for treatment procedures.   Mechanical debridement of nails 1-5  bilaterally performed with a nail nipper.  Filed with dremel without incident.     Return office visit   3 months                   Told patient to return for periodic foot care and evaluation due to potential at risk complications.   Cordella Bold DPM

## 2024-05-07 ENCOUNTER — Encounter: Payer: Self-pay | Admitting: Pulmonary Disease

## 2024-05-07 ENCOUNTER — Ambulatory Visit (INDEPENDENT_AMBULATORY_CARE_PROVIDER_SITE_OTHER): Admitting: Pulmonary Disease

## 2024-05-07 VITALS — BP 153/71 | HR 95 | Temp 97.5°F | Ht 67.5 in | Wt 183.0 lb

## 2024-05-07 DIAGNOSIS — R0609 Other forms of dyspnea: Secondary | ICD-10-CM

## 2024-05-07 NOTE — Progress Notes (Signed)
 Synopsis: Referred for dyspnea by Charlott Dorn LABOR, *  Subjective:   PATIENT ID: Diane  Drye [name] BILIS: female DOB: 12-07-1940, MRN: [id number]  Chief Complaint  Patient presents with   Shortness of Breath    SOB with exertion.    83 y.o. history of AFs/p maze, breast cancer and SCC vulva, chronic diastolic heart failure, CAD, DM, MR s/p ring annuloplasty, PH/TR, emphysema, COPD, tobacco abuse in remission.  Most recent PCP note reviewed.  ED note 02/2022.  Got ill 10/25.  Headache.  Went to ED.  No imaging.  Concern for temporal arteritis.  Prescribed high-dose prednisone .  No improvement.  Got sick sounds a great viral.  Chills fatigue myositis.  Gradually improved.  Dealing with ongoing weakness.  Tired.  Short of breath.  No improvement with rescue medications.  She reports A-fib burden has been increased over the last couple weeks as well.  Been in and out of the cardiology office per her report.  I cannot review these notes.  She reports good adherence to her Eliquis .   HPI initial visit: New patient evaluation for me today.  Breathing stable.  Doing well.  No worsening dyspnea.  Thinks Trelegy helps.  Reports albuterol  use 2-3 times a week.  During the day.  With exertion.  CT high-resolution reviewed, no ILD, emphysema noted.  Lungs otherwise look okay.  Discussed liver lesion seen on CT scan.  Present for some time.  There are some changes.  MRI recommended.  No one discussed results with her.  MyChart message was sent but she does not have access to MyChart.  She says no one called her.  There is documentation 1/29 that she was called.  And results were discussed.  Past Medical History:  Diagnosis Date   Anxiety    Arthritis    back   Atrial fibrillation (HCC)    Breast cancer (HCC)    right breast cancer - 3 years ago   Chronic diastolic heart failure (HCC)    ECHO: 06/20/2017 - Grade 2 Diastolic Dysfunction    Coronary artery disease    Depression    Diabetes  mellitus without complication (HCC)    diet   Dysrhythmia    afib   Fibromyalgia    Herniated disc    Hyperlipemia    Hypertension    Iron deficiency anemia    Longstanding persistent atrial fibrillation (HCC)    Lower back pain    Lumbar stenosis    L4-5   Personal history of chemotherapy    Personal history of radiation therapy    Pulmonary hypertension (HCC)    S/P Maze operation for atrial fibrillation 05/09/2018   Complete bilateral atrial lesion set using bipolar radiofrequency and cryothermy ablation with clipping of LA appendage   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   Docetaxel /Cytoxan  Q 3 Weeks x 4 cycles   Torn rotator cuff    Tricuspid regurgitation      Family History  Problem Relation Age of Onset  Stroke Mother    Alzheimer's disease Mother    Heart attack Father    Heart attack Brother    Breast cancer Neg Hx    Colon cancer Neg Hx    Ovarian cancer Neg Hx    Endometrial cancer Neg Hx    Pancreatic cancer Neg Hx    Prostate cancer Neg Hx      Past Surgical History:  Procedure Laterality Date   ABDOMINAL HYSTERECTOMY     BREAST BIOPSY Right 09/04/2012   BREAST LUMPECTOMY Right 10/08/2012   CLIPPING OF ATRIAL APPENDAGE N/A 05/09/2018   Procedure: CLIPPING OF ATRIAL APPENDAGE USING ATRICLIP PRO2 CLIP SIZE ;  Surgeon: Dusty Sudie DEL, MD;  Location: [facility] OR;  Service: Open Heart Surgery;  Laterality: N/A;   COLONOSCOPY     DILATION AND CURETTAGE OF UTERUS     LEFT HEART CATHETERIZATION WITH CORONARY ANGIOGRAM N/A 09/14/2014   Procedure:  LEFT HEART CATHETERIZATION WITH CORONARY ANGIOGRAM;  Surgeon: Rober Chroman, MD;  Location: [facility] CATH LAB;  Service: Cardiovascular;  Laterality: N/A;   MAZE N/A 05/09/2018   Procedure: MAZE;  Surgeon: Dusty Sudie DEL, MD;  Location: [facility] OR;  Service: Open Heart Surgery;  Laterality: N/A;   MITRAL VALVE REPAIR N/A 05/09/2018   Procedure: MITRAL VALVE REPAIR (MVR) USING MEMO 4D RING SIZE ;  Surgeon: Dusty Sudie DEL, MD;  Location: [facility] OR;  Service: Open Heart Surgery;  Laterality: N/A;   OPEN REDUCTION INTERNAL FIXATION (ORIF) PROXIMAL PHALANX Right 01/04/2016   Procedure: OPEN TREATMENT OF RIGHT THUMB PROXIMAL PHALANX FRACTURE;  Surgeon: Alm Hummer, MD;  Location: [facility];  Service: Orthopedics;  Laterality: Right;   PARTIAL MASTECTOMY WITH NEEDLE LOCALIZATION AND AXILLARY SENTINEL LYMPH NODE BX Right 10/08/2012   Procedure: RIGHT PARTIAL MASTECTOMY WITH NEEDLE LOCALIZATION AND RIGHT AXILLARY SENTINEL LYMPH NODE BX;  Surgeon: Elon CHRISTELLA Pacini, MD;  Location: [facility] OR;  Service: General;  Laterality: Right;  Injection of 1% Methylene Blue  dye into right breast   PORTACATH PLACEMENT Left 10/08/2012   Procedure: INSERTION PORT-A-CATH WITH ULTRASOUND GUIDANCE;  Surgeon: Elon CHRISTELLA Pacini, MD;  Location: [facility] OR;  Service: General;  Laterality: Left;  Using 7F Powerport Clearvue   Posterior Lumbar Arthrodesis, Pedicle screw fixation, Posterolateral Arthodesis  03/17/11   RIGHT/LEFT HEART CATH AND CORONARY ANGIOGRAPHY N/A 05/02/2018   Procedure: RIGHT/LEFT HEART CATH AND CORONARY ANGIOGRAPHY;  Surgeon: Chroman Rober, MD;  Location: [facility] INVASIVE CV LAB;  Service: Cardiovascular;  Laterality: N/A;   SHOULDER ARTHROSCOPY WITH ROTATOR CUFF REPAIR AND SUBACROMIAL DECOMPRESSION  06/04/2012   Procedure: SHOULDER ARTHROSCOPY WITH ROTATOR CUFF REPAIR AND SUBACROMIAL DECOMPRESSION;  Surgeon: Eva Elsie Herring, MD;  Location: [facility];  Service: Orthopedics;  Laterality: Left;  LEFT  SHOULDER ARTHROSCOPY WITH ROTATOR CUFF REPAIR AND SUBACROMIAL DECOMPRESSION AND DISTAL CLAVICLE RESECTION   TEE WITHOUT CARDIOVERSION N/A 05/03/2018   Procedure: TRANSESOPHAGEAL ECHOCARDIOGRAM (TEE);  Surgeon: Claudene Pacific, MD;  Location: [facility] ENDOSCOPY;  Service: Cardiovascular;  Laterality: N/A;   TEE WITHOUT CARDIOVERSION N/A 05/09/2018   Procedure: TRANSESOPHAGEAL ECHOCARDIOGRAM (TEE);  Surgeon: Dusty Sudie DEL, MD;  Location: [facility] OR;  Service: Open Heart Surgery;  Laterality: N/A;   TONSILLECTOMY     TRICUSPID VALVE REPLACEMENT N/A 05/09/2018   Procedure: TRICUSPID VALVE REPAIR USING MC3 RING SIZE ;  Surgeon: Dusty Sudie DEL, MD;  Location: [facility] OR;  Service: Open Heart Surgery;  Laterality: N/A;   TUBAL LIGATION     VULVECTOMY Left 02/06/2019   Procedure: POSTERIOR RADICAL  VULVECTOMY;  Surgeon: Eloy Herring, MD;  Location: [facility] ORS;  Service: Gynecology;  Laterality: Left;   VULVECTOMY PARTIAL     Wide Local Excision  01/2010   Bilateral groin excisions, re-excision of vulva    Social History   Socioeconomic History   Marital status: Widowed    Spouse name: Not on file   Number of children: 3   Years of education: Not on file   Highest education level: Not on file  Occupational History   Occupation: retired  Tobacco Use   Smoking status: Former    Current packs/day: 0.00    Average packs/day: 1 pack/day for 50.0 years (50.0 ttl pk-yrs)    Types: Cigarettes    Start date: 05/30/1959    Quit date: 05/29/2009    Years since quitting: 14.9   Smokeless tobacco: Never  Vaping Use   Vaping status: Never Used  Substance and Sexual Activity   Alcohol use: Not Currently    Comment: rare   Drug use: No   Sexual activity: Not Currently  Other Topics Concern   Not on file  Social History Narrative   Tobacco use cigarettes: former smoker, quit in year Sept 2011, Pack-year Hx: 74, tobacco history last updated 10/10/2013. No smoking. Per patient stopped smoking 9 months ago Caffeine.: yes,  2+ servings daily, tea. No recreational drug use. Exercise: [employer] water aer[employer]ics (wants to start going again). Marital status: widow.         3 children, 5 grand and 4 great grands, all live in [place]      Likes to do word searches, read, watch news, daughter passed away last year      Lives in senior citizen community, neighbor keeps an eye on her   Social Drivers of Health   Financial Resource Strain: Not on file  Food Insecurity: No Food Insecurity (01/29/2024)   Hunger Vital Sign    Worried About Running Out of Food in the Last Year: Never true    Ran Out of Food in the Last Year: Never true  Transportation Needs: No Transportation Needs (01/29/2024)   PRAPARE - Administrator, Civil Service (Medical): No    Lack of Transportation (Non-Medical): No  Physical Activity: Not on file  Stress: Not on file  Social Connections: Not on file  Intimate Partner Violence: Not At Risk (01/29/2024)   Humiliation, Afraid, Rape, and Kick questionnaire    Fear of Current or Ex-Partner: No    Emotionally Abused: No    Physically Abused: No    Sexually Abused: No     Allergies  Allergen Reactions   Shellfish Allergy  Anaphylaxis and Swelling    Other reaction(s): Other (See Comments)   Atorvastatin  Calcium      Other Reaction(s): muscle pain   Rosuvastatin Calcium  Other (See Comments)    Other Reaction(s): myalgia   Shellfish Protein-Containing Drug Products Other (See Comments) and Swelling     Outpatient Medications Prior to Visit  Medication Sig Dispense Refill   acetaminophen  (TYLENOL ) 500 MG tablet Take 500 mg by mouth every 6 (six) hours as needed (for pain.).     albuterol  (VENTOLIN  HFA) 108 (90 Base) MCG/ACT inhaler Inhale 2 puffs into the lungs every 6 (six) hours as needed for wheezing or shortness of breath. 1 each 3   amiodarone  (PACERONE ) 200 MG tablet Take 100 mg by mouth every other day.     busPIRone (BUSPAR) 5 MG tablet Take 5 mg by mouth 2 (two) times daily.  co-enzyme Q-10 30 MG capsule Take 30 mg by mouth daily.     ELIQUIS  2.5 MG TABS tablet Take 2.5 mg by mouth 2 (two) times daily.     ferrous sulfate 325 (65 FE) MG EC tablet Take 325 mg by mouth every other day.     Fluticasone -Umeclidin-Vilant (TRELEGY ELLIPTA ) 200-62.5-25 MCG/ACT AEPB Inhale 1 puff into the lungs daily. 60 each 11   furosemide  (LASIX ) 40 MG tablet Take 1 tablet (40 mg total) by mouth daily. 30 tablet 1   Lancets (ONETOUCH DELICA PLUS LANCET33G) MISC USE AS DIRECTED DAILY FOR 30 DAYS     loratadine  (CLARITIN ) 10 MG tablet Take 10 mg by mouth daily as needed for allergies.     melatonin 5 MG TABS 1-2 tablet in the evening at bedtime Orally Once a day; Duration: 30 days     Menthol, Topical Analgesic, (BLUE-EMU MAXIMUM STRENGTH EX) Apply 1 Application topically daily as needed.     Multiple Vitamins-Minerals (CENTRUM SILVER 50+WOMEN) TABS Take 1 tablet by mouth daily.     nitroGLYCERIN  (NITROSTAT ) 0.4 MG SL tablet Place 0.4 mg under the tongue every 5 (five) minutes x 3 doses as needed for chest pain.     ONETOUCH VERIO test strip FOR USE WHEN CHECKING BLOOD GLUCOSE ONCE DAILY ALTERNATING AM AND PM BEFORE MEALS FINGER STICK 90 DAYS  3   pantoprazole  (PROTONIX ) 40 MG tablet Take 1 tablet (40 mg total) by mouth daily. 90 tablet 3   potassium chloride  (KLOR-CON ) 10 MEQ tablet Take 10 mEq by mouth daily.     pravastatin  (PRAVACHOL ) 10 MG tablet Take 10 mg by mouth at bedtime.     spironolactone  (ALDACTONE ) 25 MG tablet Take 12.5 mg by mouth every morning.     No facility-administered medications prior to visit.       Objective:   Physical Exam:  General appearance: Well-appearing, in chair Eyes: No icterus Pulmonary: Clear, good air excursion Cardiovascular: Regular rate, irregular  Vitals:   05/07/24 1332  BP: (!) 153/71  Pulse: 95  Temp: (!) 97.5 F (36.4 C)  TempSrc: Oral  SpO2: 97%  Weight: 183 lb (83 kg)  Height: 5' 7.5 (1.715 m)    97% on RA BMI  Readings from Last 3 Encounters:  05/07/24 28.24 kg/m  02/01/24 29.81 kg/m  01/30/24 29.78 kg/m   Wt Readings from Last 3 Encounters:  05/07/24 183 lb (83 kg)  02/01/24 193 lb 3.2 oz (87.6 kg)  01/30/24 193 lb (87.5 kg)     CBC    Component Value Date/Time   WBC 7.7 04/19/2023 0531   RBC 4.10 04/19/2023 0531   HGB 11.2 (L) 04/19/2023 0531   HGB 10.6 (L) 01/11/2015 1054   HCT 35.4 (L) 04/19/2023 0531   HCT 33.1 (L) 01/11/2015 1054   PLT 163 04/19/2023 0531   PLT 207 01/11/2015 1054   MCV 86.3 04/19/2023 0531   MCV 84.1 01/11/2015 1054   MCH 27.3 04/19/2023 0531   MCHC 31.6 04/19/2023 0531   RDW 15.4 04/19/2023 0531   RDW 16.0 (H) 01/11/2015 1054   LYMPHSABS 2.2 04/19/2022 1328   LYMPHSABS 1.4 01/11/2015 1054   MONOABS 0.6 04/19/2022 1328   MONOABS 0.5 01/11/2015 1054   EOSABS 0.1 04/19/2022 1328   EOSABS 0.1 01/11/2015 1054   BASOSABS 0.0 04/19/2022 1328   BASOSABS 0.1 01/11/2015 1054    Chest Imaging: CXR 02/19/21 reviewed by me with prominent vasculature, cardiomegaly  CTA Dissection protocol 05/06/18 reviewed by me with  mosaic attenuation, cardiomegaly, small stable right effusion  Pulmonary Functions Testing Results:    Latest Ref Rng & Units 10/02/2022    2:18 PM 06/14/2022   10:47 AM 02/06/2018   11:44 AM  PFT Results  FVC-Pre L 1.62  1.35  1.26   FVC-Predicted Pre % 53  46  51   FVC-Post L  1.43  1.32   FVC-Predicted Post %  49  54   Pre FEV1/FVC % % 82  83  83   Post FEV1/FCV % %  82  87   FEV1-Pre L 1.33  1.12  1.05   FEV1-Predicted Pre % 58  51  55   FEV1-Post L  1.17  1.15   DLCO uncorrected ml/min/mmHg 10.08  8.94  11.02   DLCO UNC% % 48  44  40   DLCO corrected ml/min/mmHg 10.08  8.94  11.96   DLCO COR %Predicted % 48  44  44   DLVA Predicted % 72  85  77   TLC L   4.11   TLC % Predicted %   76   RV % Predicted %   95   Personally reviewed and interpreted as moderate COPD    Assessment & Plan:   COPD: Based on prior PFTs.  Historically  well-controlled.  Maintained on Trelegy.  Recent severe viral illness it sounds like.  Later deconditioned.  Dyspnea worse.  No better with prednisone .  No better with rescue medication.  No change in medication today.  Dyspnea on exertion: Worse over the last few weeks.  Following viral illness.  She is lost 10 pounds.  Close looser fitting.  Suspect she is significant muscle mass.  Acute inactivity and severe illness.  Encouraged gradual increase in exercise, exertion.  PE felt unlikely given she is on prophylactic Eliquis .  Lack of improvement with COPD therapies makes me think this is not a lung issue either.  Possible atrial fibrillation contribute, sounds like she has been in and out of cardiology office dealing with this over the last couple weeks.  Chronic cough: Multifactorial related to underlying COPD, GERD, history of dysphagia.  Continue PPI.  Continue COPD medicines as above.   RTC 1 month or sooner as needed with Dr. Annella Donnice JONELLE Annella, MD [facility] 05/07/2024 1:50 PM

## 2024-06-06 ENCOUNTER — Telehealth: Payer: Self-pay | Admitting: *Deleted

## 2024-06-06 NOTE — Telephone Encounter (Signed)
 Pt called to schedule her 6 month f/u appt with Dr. Rogelio. Appt scheduled, time and date confirmed with Ms. Elouise

## 2024-06-23 ENCOUNTER — Ambulatory Visit: Admitting: Pulmonary Disease

## 2024-06-25 ENCOUNTER — Ambulatory Visit: Admitting: Podiatry

## 2024-07-16 ENCOUNTER — Ambulatory Visit: Admitting: Podiatry

## 2024-07-17 ENCOUNTER — Ambulatory Visit: Admitting: Podiatry

## 2024-07-30 ENCOUNTER — Inpatient Hospital Stay: Admitting: Obstetrics & Gynecology

## 2024-07-31 ENCOUNTER — Ambulatory Visit: Admitting: Pulmonary Disease
